# Patient Record
Sex: Female | Born: 1935 | Race: White | Hispanic: No | Smoking: Former smoker
Health system: Southern US, Community
[De-identification: ages and names within clinical notes are randomized; demographics above are authoritative.]

## PROBLEM LIST (undated history)

## (undated) DIAGNOSIS — K219 Gastro-esophageal reflux disease without esophagitis: Secondary | ICD-10-CM

## (undated) DIAGNOSIS — Z923 Personal history of irradiation: Secondary | ICD-10-CM

## (undated) DIAGNOSIS — F419 Anxiety disorder, unspecified: Secondary | ICD-10-CM

## (undated) DIAGNOSIS — Z8041 Family history of malignant neoplasm of ovary: Secondary | ICD-10-CM

## (undated) DIAGNOSIS — I071 Rheumatic tricuspid insufficiency: Secondary | ICD-10-CM

## (undated) DIAGNOSIS — Z8042 Family history of malignant neoplasm of prostate: Secondary | ICD-10-CM

## (undated) DIAGNOSIS — U071 COVID-19: Secondary | ICD-10-CM

## (undated) DIAGNOSIS — C801 Malignant (primary) neoplasm, unspecified: Secondary | ICD-10-CM

## (undated) DIAGNOSIS — IMO0002 Reserved for concepts with insufficient information to code with codable children: Secondary | ICD-10-CM

## (undated) DIAGNOSIS — R319 Hematuria, unspecified: Secondary | ICD-10-CM

## (undated) DIAGNOSIS — I10 Essential (primary) hypertension: Secondary | ICD-10-CM

## (undated) DIAGNOSIS — F32A Depression, unspecified: Secondary | ICD-10-CM

## (undated) DIAGNOSIS — F329 Major depressive disorder, single episode, unspecified: Secondary | ICD-10-CM

## (undated) DIAGNOSIS — Z803 Family history of malignant neoplasm of breast: Secondary | ICD-10-CM

## (undated) DIAGNOSIS — Z800 Family history of malignant neoplasm of digestive organs: Secondary | ICD-10-CM

## (undated) DIAGNOSIS — I2510 Atherosclerotic heart disease of native coronary artery without angina pectoris: Secondary | ICD-10-CM

## (undated) HISTORY — DX: Anxiety disorder, unspecified: F41.9

## (undated) HISTORY — DX: Rheumatic tricuspid insufficiency: I07.1

## (undated) HISTORY — DX: Malignant (primary) neoplasm, unspecified: C80.1

## (undated) HISTORY — PX: COLON SURGERY: SHX602

## (undated) HISTORY — DX: Major depressive disorder, single episode, unspecified: F32.9

## (undated) HISTORY — DX: Family history of malignant neoplasm of breast: Z80.3

## (undated) HISTORY — DX: Reserved for concepts with insufficient information to code with codable children: IMO0002

## (undated) HISTORY — DX: Gastro-esophageal reflux disease without esophagitis: K21.9

## (undated) HISTORY — DX: Depression, unspecified: F32.A

## (undated) HISTORY — DX: Hematuria, unspecified: R31.9

## (undated) HISTORY — DX: Family history of malignant neoplasm of prostate: Z80.42

## (undated) HISTORY — PX: MASTECTOMY: SHX3

## (undated) HISTORY — PX: ABDOMINAL HYSTERECTOMY: SHX81

## (undated) HISTORY — DX: Atherosclerotic heart disease of native coronary artery without angina pectoris: I25.10

## (undated) HISTORY — DX: Family history of malignant neoplasm of digestive organs: Z80.0

## (undated) HISTORY — DX: Essential (primary) hypertension: I10

## (undated) HISTORY — PX: TONSILLECTOMY: SUR1361

## (undated) HISTORY — DX: Family history of malignant neoplasm of ovary: Z80.41

---

## 1998-08-17 ENCOUNTER — Ambulatory Visit (HOSPITAL_COMMUNITY): Admission: RE | Admit: 1998-08-17 | Discharge: 1998-08-17 | Payer: Self-pay | Admitting: Gastroenterology

## 1998-08-17 ENCOUNTER — Encounter: Payer: Self-pay | Admitting: Gastroenterology

## 1998-10-05 ENCOUNTER — Inpatient Hospital Stay (HOSPITAL_COMMUNITY): Admission: EM | Admit: 1998-10-05 | Discharge: 1998-10-13 | Payer: Self-pay | Admitting: Emergency Medicine

## 1998-10-25 ENCOUNTER — Encounter: Payer: Self-pay | Admitting: Surgery

## 1998-10-25 ENCOUNTER — Inpatient Hospital Stay (HOSPITAL_COMMUNITY): Admission: EM | Admit: 1998-10-25 | Discharge: 1998-10-28 | Payer: Self-pay | Admitting: Emergency Medicine

## 1998-11-01 ENCOUNTER — Encounter: Payer: Self-pay | Admitting: Surgery

## 1998-11-01 ENCOUNTER — Observation Stay (HOSPITAL_COMMUNITY): Admission: EM | Admit: 1998-11-01 | Discharge: 1998-11-02 | Payer: Self-pay

## 1999-11-28 ENCOUNTER — Emergency Department (HOSPITAL_COMMUNITY): Admission: EM | Admit: 1999-11-28 | Discharge: 1999-11-28 | Payer: Self-pay | Admitting: Emergency Medicine

## 1999-11-28 ENCOUNTER — Encounter: Payer: Self-pay | Admitting: Emergency Medicine

## 1999-11-29 ENCOUNTER — Emergency Department (HOSPITAL_COMMUNITY): Admission: EM | Admit: 1999-11-29 | Discharge: 1999-11-29 | Payer: Self-pay | Admitting: Emergency Medicine

## 1999-11-30 ENCOUNTER — Emergency Department (HOSPITAL_COMMUNITY): Admission: EM | Admit: 1999-11-30 | Discharge: 1999-11-30 | Payer: Self-pay | Admitting: *Deleted

## 1999-11-30 ENCOUNTER — Encounter: Payer: Self-pay | Admitting: Urology

## 2000-01-08 ENCOUNTER — Emergency Department (HOSPITAL_COMMUNITY): Admission: EM | Admit: 2000-01-08 | Discharge: 2000-01-08 | Payer: Self-pay | Admitting: Emergency Medicine

## 2001-09-11 ENCOUNTER — Encounter: Payer: Self-pay | Admitting: Internal Medicine

## 2001-09-11 ENCOUNTER — Encounter: Admission: RE | Admit: 2001-09-11 | Discharge: 2001-09-11 | Payer: Self-pay | Admitting: Internal Medicine

## 2002-09-03 ENCOUNTER — Encounter: Payer: Self-pay | Admitting: *Deleted

## 2002-09-03 ENCOUNTER — Inpatient Hospital Stay (HOSPITAL_COMMUNITY): Admission: EM | Admit: 2002-09-03 | Discharge: 2002-09-06 | Payer: Self-pay

## 2002-09-06 ENCOUNTER — Encounter: Payer: Self-pay | Admitting: *Deleted

## 2002-09-12 ENCOUNTER — Encounter: Payer: Self-pay | Admitting: Gastroenterology

## 2005-01-26 ENCOUNTER — Ambulatory Visit: Payer: Self-pay | Admitting: Gastroenterology

## 2005-02-14 ENCOUNTER — Ambulatory Visit: Payer: Self-pay | Admitting: Gastroenterology

## 2005-11-10 ENCOUNTER — Encounter: Admission: RE | Admit: 2005-11-10 | Discharge: 2005-11-10 | Payer: Self-pay | Admitting: Internal Medicine

## 2006-02-20 ENCOUNTER — Encounter: Admission: RE | Admit: 2006-02-20 | Discharge: 2006-02-20 | Payer: Self-pay | Admitting: Internal Medicine

## 2006-10-09 ENCOUNTER — Ambulatory Visit: Payer: Self-pay | Admitting: Gastroenterology

## 2006-10-10 ENCOUNTER — Ambulatory Visit: Payer: Self-pay | Admitting: Gastroenterology

## 2007-01-30 ENCOUNTER — Encounter: Admission: RE | Admit: 2007-01-30 | Discharge: 2007-01-30 | Payer: Self-pay | Admitting: Internal Medicine

## 2007-04-09 ENCOUNTER — Ambulatory Visit: Payer: Self-pay | Admitting: Gastroenterology

## 2007-09-04 ENCOUNTER — Ambulatory Visit: Payer: Self-pay | Admitting: Internal Medicine

## 2007-09-11 ENCOUNTER — Ambulatory Visit: Payer: Self-pay | Admitting: Internal Medicine

## 2007-09-19 DIAGNOSIS — K219 Gastro-esophageal reflux disease without esophagitis: Secondary | ICD-10-CM

## 2007-10-15 ENCOUNTER — Telehealth (INDEPENDENT_AMBULATORY_CARE_PROVIDER_SITE_OTHER): Payer: Self-pay | Admitting: *Deleted

## 2007-10-30 ENCOUNTER — Ambulatory Visit: Payer: Self-pay | Admitting: Internal Medicine

## 2007-10-30 DIAGNOSIS — J45909 Unspecified asthma, uncomplicated: Secondary | ICD-10-CM | POA: Insufficient documentation

## 2007-10-30 DIAGNOSIS — R05 Cough: Secondary | ICD-10-CM

## 2007-11-12 ENCOUNTER — Telehealth (INDEPENDENT_AMBULATORY_CARE_PROVIDER_SITE_OTHER): Payer: Self-pay | Admitting: *Deleted

## 2007-11-19 ENCOUNTER — Inpatient Hospital Stay (HOSPITAL_COMMUNITY): Admission: EM | Admit: 2007-11-19 | Discharge: 2007-11-21 | Payer: Self-pay | Admitting: Emergency Medicine

## 2007-11-22 ENCOUNTER — Ambulatory Visit: Payer: Self-pay | Admitting: Internal Medicine

## 2008-01-22 ENCOUNTER — Telehealth: Payer: Self-pay | Admitting: Internal Medicine

## 2008-01-22 ENCOUNTER — Ambulatory Visit: Payer: Self-pay | Admitting: Internal Medicine

## 2008-03-13 DIAGNOSIS — R1319 Other dysphagia: Secondary | ICD-10-CM | POA: Insufficient documentation

## 2008-03-13 DIAGNOSIS — K649 Unspecified hemorrhoids: Secondary | ICD-10-CM | POA: Insufficient documentation

## 2008-04-23 ENCOUNTER — Encounter: Admission: RE | Admit: 2008-04-23 | Discharge: 2008-04-23 | Payer: Self-pay | Admitting: Geriatric Medicine

## 2008-05-15 ENCOUNTER — Telehealth: Payer: Self-pay | Admitting: Gastroenterology

## 2008-06-26 ENCOUNTER — Encounter: Payer: Self-pay | Admitting: Internal Medicine

## 2008-12-14 ENCOUNTER — Telehealth (INDEPENDENT_AMBULATORY_CARE_PROVIDER_SITE_OTHER): Payer: Self-pay | Admitting: *Deleted

## 2008-12-23 ENCOUNTER — Ambulatory Visit: Payer: Self-pay | Admitting: Internal Medicine

## 2008-12-23 DIAGNOSIS — I10 Essential (primary) hypertension: Secondary | ICD-10-CM | POA: Insufficient documentation

## 2009-02-03 ENCOUNTER — Ambulatory Visit: Payer: Self-pay | Admitting: Internal Medicine

## 2009-02-15 ENCOUNTER — Telehealth (INDEPENDENT_AMBULATORY_CARE_PROVIDER_SITE_OTHER): Payer: Self-pay | Admitting: *Deleted

## 2009-02-19 ENCOUNTER — Telehealth (INDEPENDENT_AMBULATORY_CARE_PROVIDER_SITE_OTHER): Payer: Self-pay | Admitting: *Deleted

## 2009-02-19 ENCOUNTER — Ambulatory Visit: Payer: Self-pay | Admitting: Pulmonary Disease

## 2009-02-19 DIAGNOSIS — J209 Acute bronchitis, unspecified: Secondary | ICD-10-CM

## 2009-03-02 ENCOUNTER — Ambulatory Visit: Payer: Self-pay | Admitting: Internal Medicine

## 2009-03-30 ENCOUNTER — Telehealth (INDEPENDENT_AMBULATORY_CARE_PROVIDER_SITE_OTHER): Payer: Self-pay | Admitting: *Deleted

## 2009-03-31 ENCOUNTER — Telehealth (INDEPENDENT_AMBULATORY_CARE_PROVIDER_SITE_OTHER): Payer: Self-pay | Admitting: *Deleted

## 2009-04-01 ENCOUNTER — Telehealth (INDEPENDENT_AMBULATORY_CARE_PROVIDER_SITE_OTHER): Payer: Self-pay | Admitting: *Deleted

## 2009-04-06 ENCOUNTER — Ambulatory Visit: Payer: Self-pay | Admitting: Internal Medicine

## 2009-04-06 DIAGNOSIS — J310 Chronic rhinitis: Secondary | ICD-10-CM | POA: Insufficient documentation

## 2009-04-07 ENCOUNTER — Ambulatory Visit: Payer: Self-pay | Admitting: Cardiology

## 2009-05-26 ENCOUNTER — Telehealth (INDEPENDENT_AMBULATORY_CARE_PROVIDER_SITE_OTHER): Payer: Self-pay | Admitting: *Deleted

## 2009-06-02 ENCOUNTER — Encounter (INDEPENDENT_AMBULATORY_CARE_PROVIDER_SITE_OTHER): Payer: Self-pay | Admitting: *Deleted

## 2009-06-14 ENCOUNTER — Encounter: Admission: RE | Admit: 2009-06-14 | Discharge: 2009-06-14 | Payer: Self-pay | Admitting: Geriatric Medicine

## 2009-07-02 ENCOUNTER — Encounter: Admission: RE | Admit: 2009-07-02 | Discharge: 2009-07-02 | Payer: Self-pay | Admitting: Geriatric Medicine

## 2009-07-23 ENCOUNTER — Observation Stay (HOSPITAL_COMMUNITY): Admission: EM | Admit: 2009-07-23 | Discharge: 2009-07-24 | Payer: Self-pay | Admitting: Emergency Medicine

## 2010-08-03 ENCOUNTER — Encounter: Admission: RE | Admit: 2010-08-03 | Discharge: 2010-08-03 | Payer: Self-pay | Admitting: Geriatric Medicine

## 2010-08-04 ENCOUNTER — Encounter: Admission: RE | Admit: 2010-08-04 | Discharge: 2010-08-04 | Payer: Self-pay | Admitting: Geriatric Medicine

## 2010-08-11 ENCOUNTER — Encounter: Admission: RE | Admit: 2010-08-11 | Discharge: 2010-08-11 | Payer: Self-pay | Admitting: Geriatric Medicine

## 2010-08-11 ENCOUNTER — Ambulatory Visit: Payer: Self-pay | Admitting: Oncology

## 2010-08-19 ENCOUNTER — Encounter: Admission: RE | Admit: 2010-08-19 | Discharge: 2010-08-19 | Payer: Self-pay | Admitting: Geriatric Medicine

## 2010-08-30 HISTORY — PX: BREAST SURGERY: SHX581

## 2010-09-13 ENCOUNTER — Ambulatory Visit (HOSPITAL_COMMUNITY): Admission: RE | Admit: 2010-09-13 | Discharge: 2010-09-14 | Payer: Self-pay | Admitting: General Surgery

## 2010-09-13 ENCOUNTER — Encounter (INDEPENDENT_AMBULATORY_CARE_PROVIDER_SITE_OTHER): Payer: Self-pay | Admitting: General Surgery

## 2010-10-03 ENCOUNTER — Ambulatory Visit: Payer: Self-pay | Admitting: Oncology

## 2010-10-05 ENCOUNTER — Ambulatory Visit
Admission: RE | Admit: 2010-10-05 | Discharge: 2010-11-29 | Payer: Self-pay | Source: Home / Self Care | Attending: Radiation Oncology | Admitting: Radiation Oncology

## 2010-11-03 ENCOUNTER — Ambulatory Visit: Payer: Self-pay | Admitting: Oncology

## 2010-11-11 ENCOUNTER — Ambulatory Visit
Admission: RE | Admit: 2010-11-11 | Discharge: 2010-11-11 | Payer: Self-pay | Source: Home / Self Care | Attending: General Surgery | Admitting: General Surgery

## 2010-11-30 ENCOUNTER — Ambulatory Visit: Payer: Medicare Other | Admitting: Radiation Oncology

## 2010-12-05 ENCOUNTER — Ambulatory Visit: Payer: Medicare Other | Attending: Radiation Oncology | Admitting: Radiation Oncology

## 2010-12-05 DIAGNOSIS — C50119 Malignant neoplasm of central portion of unspecified female breast: Secondary | ICD-10-CM | POA: Insufficient documentation

## 2010-12-05 DIAGNOSIS — A0811 Acute gastroenteropathy due to Norwalk agent: Secondary | ICD-10-CM | POA: Insufficient documentation

## 2010-12-05 DIAGNOSIS — R071 Chest pain on breathing: Secondary | ICD-10-CM | POA: Insufficient documentation

## 2010-12-05 DIAGNOSIS — Z510 Encounter for antineoplastic radiation therapy: Secondary | ICD-10-CM | POA: Insufficient documentation

## 2010-12-05 DIAGNOSIS — L539 Erythematous condition, unspecified: Secondary | ICD-10-CM | POA: Insufficient documentation

## 2010-12-05 DIAGNOSIS — IMO0002 Reserved for concepts with insufficient information to code with codable children: Secondary | ICD-10-CM | POA: Insufficient documentation

## 2010-12-05 DIAGNOSIS — Y836 Removal of other organ (partial) (total) as the cause of abnormal reaction of the patient, or of later complication, without mention of misadventure at the time of the procedure: Secondary | ICD-10-CM | POA: Insufficient documentation

## 2010-12-05 DIAGNOSIS — H0289 Other specified disorders of eyelid: Secondary | ICD-10-CM | POA: Insufficient documentation

## 2011-01-10 LAB — COMPREHENSIVE METABOLIC PANEL
ALT: 31 U/L (ref 0–35)
Albumin: 4 g/dL (ref 3.5–5.2)
Alkaline Phosphatase: 105 U/L (ref 39–117)
Calcium: 9.5 mg/dL (ref 8.4–10.5)
Chloride: 106 mEq/L (ref 96–112)
GFR calc non Af Amer: 53 mL/min — ABNORMAL LOW (ref >60)
Potassium: 3.9 mEq/L (ref 3.5–5.1)
Sodium: 140 mEq/L (ref 135–145)
Total Protein: 6.5 g/dL (ref 6.0–8.3)

## 2011-01-10 LAB — DIFFERENTIAL
Basophils Relative: 2 % — ABNORMAL HIGH (ref 0–1)
Eosinophils Absolute: 0.5 10*3/uL (ref 0.0–0.7)
Lymphs Abs: 1.5 10*3/uL (ref 0.7–4.0)
Monocytes Absolute: 0.5 10*3/uL (ref 0.1–1.0)
Monocytes Relative: 10 % (ref 3–12)
Neutro Abs: 2.9 10*3/uL (ref 1.7–7.7)
Neutrophils Relative %: 52 % (ref 43–77)

## 2011-01-10 LAB — CBC
MCHC: 33.6 g/dL (ref 30.0–36.0)
Platelets: 149 10*3/uL — ABNORMAL LOW (ref 150–400)
RDW: 13.1 % (ref 11.5–15.5)
WBC: 5.5 10*3/uL (ref 4.0–10.5)

## 2011-01-10 LAB — SURGICAL PCR SCREEN: MRSA, PCR: NEGATIVE

## 2011-01-15 ENCOUNTER — Emergency Department (HOSPITAL_COMMUNITY)
Admission: EM | Admit: 2011-01-15 | Discharge: 2011-01-15 | Disposition: A | Discharge status: Home / Self Care | Payer: Medicare Other | Attending: Emergency Medicine | Admitting: Emergency Medicine

## 2011-01-15 DIAGNOSIS — Z853 Personal history of malignant neoplasm of breast: Secondary | ICD-10-CM | POA: Insufficient documentation

## 2011-01-15 DIAGNOSIS — E869 Volume depletion, unspecified: Secondary | ICD-10-CM | POA: Insufficient documentation

## 2011-01-15 DIAGNOSIS — R000 Tachycardia, unspecified: Secondary | ICD-10-CM | POA: Insufficient documentation

## 2011-01-15 DIAGNOSIS — I10 Essential (primary) hypertension: Secondary | ICD-10-CM | POA: Insufficient documentation

## 2011-01-15 DIAGNOSIS — R112 Nausea with vomiting, unspecified: Secondary | ICD-10-CM | POA: Insufficient documentation

## 2011-01-15 DIAGNOSIS — R197 Diarrhea, unspecified: Secondary | ICD-10-CM | POA: Insufficient documentation

## 2011-01-15 LAB — POCT I-STAT, CHEM 8
BUN: 29 mg/dL — ABNORMAL HIGH (ref 6–23)
Chloride: 102 mEq/L (ref 96–112)
Creatinine, Ser: 1.1 mg/dL (ref 0.4–1.2)
Sodium: 139 mEq/L (ref 135–145)
TCO2: 29 mmol/L (ref 0–100)

## 2011-01-23 ENCOUNTER — Encounter (HOSPITAL_BASED_OUTPATIENT_CLINIC_OR_DEPARTMENT_OTHER): Payer: Medicare Other | Admitting: Oncology

## 2011-01-23 ENCOUNTER — Other Ambulatory Visit: Payer: Self-pay | Admitting: Oncology

## 2011-01-23 DIAGNOSIS — C50119 Malignant neoplasm of central portion of unspecified female breast: Secondary | ICD-10-CM

## 2011-01-23 LAB — CBC WITH DIFFERENTIAL/PLATELET
Basophils Absolute: 0 10*3/uL (ref 0.0–0.1)
Eosinophils Absolute: 0.4 10*3/uL (ref 0.0–0.5)
HCT: 41.2 % (ref 34.8–46.6)
LYMPH%: 14.1 % (ref 14.0–49.7)
MCV: 93.4 fL (ref 79.5–101.0)
MONO#: 0.3 10*3/uL (ref 0.1–0.9)
MONO%: 7.1 % (ref 0.0–14.0)
NEUT#: 2.9 10*3/uL (ref 1.5–6.5)
NEUT%: 68.3 % (ref 38.4–76.8)
Platelets: 185 10*3/uL (ref 145–400)
RBC: 4.4 10*6/uL (ref 3.70–5.45)
WBC: 4.2 10*3/uL (ref 3.9–10.3)

## 2011-01-24 LAB — COMPREHENSIVE METABOLIC PANEL
Alkaline Phosphatase: 82 U/L (ref 39–117)
BUN: 14 mg/dL (ref 6–23)
CO2: 26 mEq/L (ref 19–32)
Creatinine, Ser: 0.98 mg/dL (ref 0.40–1.20)
Glucose, Bld: 91 mg/dL (ref 70–99)
Total Bilirubin: 0.6 mg/dL (ref 0.3–1.2)
Total Protein: 6.5 g/dL (ref 6.0–8.3)

## 2011-01-24 LAB — VITAMIN D 25 HYDROXY (VIT D DEFICIENCY, FRACTURES): Vit D, 25-Hydroxy: 39 ng/mL (ref 30–89)

## 2011-01-31 ENCOUNTER — Encounter (HOSPITAL_BASED_OUTPATIENT_CLINIC_OR_DEPARTMENT_OTHER): Payer: Medicare Other | Admitting: Oncology

## 2011-01-31 DIAGNOSIS — C50119 Malignant neoplasm of central portion of unspecified female breast: Secondary | ICD-10-CM

## 2011-01-31 DIAGNOSIS — M810 Age-related osteoporosis without current pathological fracture: Secondary | ICD-10-CM

## 2011-02-03 LAB — POCT CARDIAC MARKERS
CKMB, poc: <1 ng/mL — ABNORMAL LOW (ref 1.0–8.0)
Myoglobin, poc: 70.8 ng/mL (ref 12–200)
Myoglobin, poc: 72 ng/mL (ref 12–200)
Troponin i, poc: <0.05 ng/mL (ref 0.00–0.09)

## 2011-02-03 LAB — COMPREHENSIVE METABOLIC PANEL
ALT: 27 U/L (ref 0–35)
AST: 31 U/L (ref 0–37)
Alkaline Phosphatase: 79 U/L (ref 39–117)
CO2: 28 mEq/L (ref 19–32)
Calcium: 9.5 mg/dL (ref 8.4–10.5)
Chloride: 107 mEq/L (ref 96–112)
GFR calc Af Amer: >60 mL/min (ref >60)
GFR calc non Af Amer: 55 mL/min — ABNORMAL LOW (ref >60)
Glucose, Bld: 104 mg/dL — ABNORMAL HIGH (ref 70–99)
Potassium: 3.6 mEq/L (ref 3.5–5.1)
Sodium: 141 mEq/L (ref 135–145)
Total Bilirubin: 0.7 mg/dL (ref 0.3–1.2)

## 2011-02-03 LAB — CBC
HCT: 36.7 % (ref 36.0–46.0)
HCT: 42.6 % (ref 36.0–46.0)
Hemoglobin: 12.6 g/dL (ref 12.0–15.0)
Hemoglobin: 14.6 g/dL (ref 12.0–15.0)
MCV: 95.8 fL (ref 78.0–100.0)
MCV: 95.8 fL (ref 78.0–100.0)
Platelets: 159 10*3/uL (ref 150–400)
RBC: 4.45 MIL/uL (ref 3.87–5.11)
WBC: 5.1 10*3/uL (ref 4.0–10.5)
WBC: 5.8 10*3/uL (ref 4.0–10.5)

## 2011-02-03 LAB — CARDIAC PANEL(CRET KIN+CKTOT+MB+TROPI)
CK, MB: 0.7 ng/mL (ref 0.3–4.0)
Total CK: 39 U/L (ref 7–177)
Total CK: 44 U/L (ref 7–177)
Troponin I: 0.01 ng/mL (ref 0.00–0.06)

## 2011-02-03 LAB — HEMOGLOBIN A1C: Hgb A1c MFr Bld: 5.2 % (ref 4.6–6.1)

## 2011-02-03 LAB — POCT I-STAT, CHEM 8
BUN: 24 mg/dL — ABNORMAL HIGH (ref 6–23)
Chloride: 105 mEq/L (ref 96–112)
Creatinine, Ser: 1.2 mg/dL (ref 0.4–1.2)
Sodium: 138 mEq/L (ref 135–145)
TCO2: 24 mmol/L (ref 0–100)

## 2011-02-03 LAB — DIFFERENTIAL
Basophils Absolute: 0 10*3/uL (ref 0.0–0.1)
Eosinophils Relative: 5 % (ref 0–5)
Lymphocytes Relative: 33 % (ref 12–46)
Lymphs Abs: 1.9 10*3/uL (ref 0.7–4.0)
Neutro Abs: 3 10*3/uL (ref 1.7–7.7)

## 2011-02-03 LAB — BASIC METABOLIC PANEL
Chloride: 105 mEq/L (ref 96–112)
GFR calc non Af Amer: 45 mL/min — ABNORMAL LOW (ref >60)
Glucose, Bld: 109 mg/dL — ABNORMAL HIGH (ref 70–99)
Potassium: 3.6 mEq/L (ref 3.5–5.1)
Sodium: 141 mEq/L (ref 135–145)

## 2011-02-03 LAB — PROTIME-INR: Prothrombin Time: 13.2 seconds (ref 11.6–15.2)

## 2011-02-03 LAB — TSH: TSH: 0.978 u[IU]/mL (ref 0.350–4.500)

## 2011-02-03 LAB — TROPONIN I: Troponin I: 0.01 ng/mL (ref 0.00–0.06)

## 2011-02-20 ENCOUNTER — Other Ambulatory Visit: Payer: Self-pay | Admitting: Dermatology

## 2011-02-22 ENCOUNTER — Ambulatory Visit: Admission: RE | Admit: 2011-02-22 | Payer: Medicare Other | Source: Ambulatory Visit | Admitting: Radiation Oncology

## 2011-03-14 NOTE — Discharge Summary (Signed)
Gloria [name], Gloria [name]                 ACCOUNT NO.:  [id number]   MEDICAL RECORD NO.:  [id number]          PATIENT TYPE:  INP   LOCATION:  2029                         FACILITY:  [facility]   PHYSICIAN:  Ramiro Harvest, MD    DATE OF BIRTH:  [birth year]   DATE OF ADMISSION:  11/19/2007  DATE OF DISCHARGE:  11/21/2007                               DISCHARGE SUMMARY   PRIMARY CARE PHYSICIAN:  Hal T. Stoneking, M.D.   PULMONOLOGIST:  Charlaine Dalton. Sherene Sires, M.D., [facility], of [facility].   DISCHARGE DIAGNOSES:  1. Atypical chest pain, likely musculoskeletal in nature.  2. Hypokalemia.  3. Hypertension.  4. History of asthma.  5. Gastroesophageal reflux disease.  6. Chronic sinusitis.  7. Question of mitral valve prolapse.  8. History of diverticulitis.   DISCHARGE MEDICATIONS:  1. Tylenol Extra Strength 500 mg p.o. t.i.d. x1 week.  2. Doxepin 20 mg p.o. q.h.s.  3. HCTZ 25 mg p.o. daily.  4. Xanax 0.25 mg p.o. t.i.d. p.r.n.  5. Aspirin 81 mg p.o. daily.  6. Prilosec 20 mg p.o. daily.  7. Pulmicort b.i.d.  8. Brovana nebulizers b.i.d. as previously taken.   DISPOSITION/FOLLOWUP:  The patient will be discharged home.  The patient  is to keep her scheduled followup with Dr. Sherene Sires on November 22, 2007.  The patient is to schedule a followup appointment with primary care  physician in 1 week, and followup basic metabolic profile needs to be  checked to follow up on the patient's electrolytes, mainly her  potassium, and also to reassess the patient's musculoskeletal chest pain  and decide whether the patient needs to continue on the scheduled doses  of Tylenol.   PROCEDURES:  1. Chest x-ray was obtained on November 19, 2007, which showed a mild      left basilar atelectasis.  2. Plain films of the left shoulder were done on November 19, 2007,      which showed degenerative changes of the left [facility] joint.   CONSULTATIONS:  None.   BRIEF ADMISSION HISTORY AND PHYSICAL:  Gloria [name] is a  77 year old  white female, past medical history of GERD, chronic sinusitis, asthma,  history of questionable mitral valve prolapse and also elevated LFTs who  had presented with complaints of chest pain.  It was also noted that the  patient did have a cardiac catheterization done in 2003 per Dr. Riley Kill  with no coronary artery disease recorded.  The patient had stated that  her chest pain had started a little over an hour prior to arrival in the  ED in the early morning of admission.  Initially the patient was having  pain in the shoulder blade area of her back, which then moved to the  left precordial area.  At its worst, she stated that it was an 8 out of  10 in intensity with associated shortness of breath and nausea but no  diaphoresis or vomiting.  The patient had reported that she was awakened  again by the pain, which was nonexertional.  She also stated that her  left chest wall area felt  sore to touch and whenever she raised  or  moved her left arm it seemed to precipitate some numbness down the left  arm.  The patient denied any cough, fevers, dysuria, melena, diarrhea or  hematochezia.  The patient was seen in the ED and had point-of-care  markers which were negative.  A D-dimer was within normal limits at  0.22.  EKG showed normal sinus rhythm with no acute ischemic changes and  chest x-ray with no acute infiltrates.  The patient was admitted for  further evaluation and management.   PHYSICAL EXAMINATION:  VITAL SIGNS:  Temperature 97.4, blood pressure  150/93, pulse 70, respiratory rate 17, O2 sat 97% on room air.  GENERAL:  The patient is an elderly white female in no respiratory  distress.  HEENT:  Normocephalic, atraumatic.  Pupils are equal, round and reactive  to light.  Extraocular movements are intact.  Sclerae are  anicteric.  Moist mucous membranes.  No exudates.  NECK:  Supple.  No lymphadenopathy.  No thyromegaly.  No JVD.  LUNGS:  Diminished air movement  bilaterally.  No crackles.  No wheezes.  CHEST:  Left-sided chest wall tenderness.  CARDIOVASCULAR:  Regular rate and rhythm.  Normal S1 and S2.  No S3  appreciated.  ABDOMEN:  Soft.  Positive bowel sounds.  Nontender, nondistended.  No  palpable masses.  EXTREMITIES:  No clubbing, cyanosis or edema.   ADMISSION LABORATORIES:  White count 5, hemoglobin 13.7, hematocrit  39.3, platelet count 171,000.  Sodium 140, potassium 3.6, chloride 106,  BUN 19, glucose 99, pH of 7.43, pCO2 of 43, creatinine 1.1.  Point-of-  care cardiac markers were negative.   HOSPITAL COURSE:  1. Atypical chest pain.  Serial cardiac enzymes were obtained, which      came out negative.  Chest x-ray was also obtained, which was      negative.  The patient was placed on aspirin as well as as-needed      nitroglycerin.  A left shoulder x-ray was done with results as      stated above.  The patient was placed on a proton pump inhibitor.      A D-dimer was also negative.  Due to allergy to NSAIDs, which as      per patient had led to bronchospastic asthma exacerbation, it was      decided to place the patient on scheduled Tylenol.  The patient was      placed on scheduled Tylenol with improvement in symptoms.  The      patient will be discharged home in stable and improved condition      with followup with PCP.  The patient will be discharged with      scheduled Tylenol Extra Strength 500 mg 3 times daily for 1 week      until reassessed by PCP in a week post discharge.  2. Hypertension, stable.  The patient was maintained on outpatient      medication of hydrochlorothiazide 25 mg daily.  3. History of asthma/chronic obstructive pulmonary disease,  stable.      The patient was just maintained on Xopenex and Atrovent while in-      house and also on Pulmicort.  The patient was stable, had no      wheezes, and the patient will be discharged home on her home dose      of Pulmicort and Brovana to follow up with the  pulmonologist, Dr.      Sherene Sires, on  November 22, 2007.  4. Gastroesophageal reflux disease, stable.  The patient was      maintained on a PPI throughout the hospitalization.  5. History of elevated liver function tests.  The patient had LFTs      which were rechecked during the hospitalization, and these came      back normal.   The rest of the patient's chronic medical issues were stable throughout  the hospitalization.  On the day of discharge, the patient was in stable  and improved condition.  Vital signs:  Temperature 96.4, pulse 69,  respirations 20, blood pressure 136/87, satting 94% on room air.   DISCHARGE LABORATORIES:  Sodium 140, potassium 4.1, chloride 108, bicarb  26, glucose 97, BUN 21, creatinine 1.10 and a calcium of 8.9 and  magnesium level of 2.2.   It has been a pleasure taking care of Ms. Adah Salvage.      Ramiro Harvest, MD  Electronically Signed     DT/MEDQ  D:  11/21/2007  T:  11/21/2007  Job:  [id number]   cc:   Hal T. Stoneking, M.D.  Charlaine Dalton. Sherene Sires, MD, FCCP

## 2011-03-14 NOTE — Assessment & Plan Note (Signed)
[name]                             PULMONARY OFFICE NOTE   Gloria [name], Gloria [name]                        MRN:          [id number]  DATE:09/11/2007                            DOB:          [birth year]    HISTORY OF PRESENT ILLNESS:  This is a 77 year old white female patient  of Dr. Thurston Hole who was recently seen for refractory cough and asthmatic  bronchitis.  The patient at last visit was given Brovana and budesonide  twice daily.  Add in Mucinex DM twice daily for cough control.  The  patient returns today reporting that she is substantially improved,  feels much better with totally resolved cough.  She does have some  occasional congestion, but this is also substantially improved.  She  occasionally complains of some postnasal drainage symptoms.  Patient  denies any chest pain, orthopnea, PND, leg swelling, or purulent sputum.   PAST MEDICAL HISTORY:  Reviewed.   CURRENT MEDICATIONS:  Reviewed.   PHYSICAL EXAMINATION:  Patient is a pleasant female in no acute  distress.  She is afebrile with stable vital signs.  O2 saturation is 97% on room  air.  HEENT:  Nasal mucosa with some mild erythema.  Nontender sinuses.  Posterior pharynx is clear.  NECK:  Supple without cervical adenopathy.  No JVD.  LUNGS:  The lung sounds are diminished in the bases, otherwise clear.  ABDOMEN:  Soft, nontender.  No palpable hepatosplenomegaly.  EXTREMITIES:  Warm without any edema.   IMPRESSION/PLAN:  1. Cyclical cough in an asthmatic with resolved asthmatic bronchitic      flare.  Patient is much improved with cough suppression regimen and      Brovana budesonide.  We will continue on her present regimen, add      in  Mucinex DM as needed for cough and congestion.  Will add in      Zyrtec 10 mg at bedtime to help with any postnasal drip symptoms      that could be irritating the airways.  Patient will return back      with Dr. Sherene Sires as scheduled in two weeks or  sooner as needed.  2. Complex medication regimen:  Patient's medications are reviewed in      detail.  Patient      education was provided via the computerized medication calendar,      which was completed for this patient.      Rubye Oaks, NP  Electronically Signed      Charlaine Dalton. Sherene Sires, MD, [facility]  Electronically Signed   TP/MedQ  DD: 09/12/2007  DT: 09/13/2007  Job #: [id number]

## 2011-03-14 NOTE — H&P (Signed)
Gloria [name], Gloria [name]                 ACCOUNT NO.:  [id number]   MEDICAL RECORD NO.:  [id number]          PATIENT TYPE:  INP   LOCATION:  2029                         FACILITY:  [facility]   PHYSICIAN:  Kela Millin, M.D.DATE OF BIRTH:  Mar 25, 1928   DATE OF ADMISSION:  11/19/2007  DATE OF DISCHARGE:                              HISTORY & PHYSICAL   PRIMARY CARE PHYSICIAN:  Dr. Merlene Laughter.   CHIEF COMPLAINT:  Chest pain.   HISTORY OF PRESENT ILLNESS:  The patient is a 77 year old white female  with past medical history significant for GERD, chronic sinusitis,  asthma, history of mitral valve prolapse and elevated LFTs, who presents  with the above complaints.  It is also noted that the patient had a  cardiac catheterization in 2003 by Dr. Riley Kill with no coronary artery  disease recorded.  The patient states that the pain started a little  over an hour prior to her arrival in the ER early on the morning of  admission.  Initially, she was having pain in the shoulder blade area of  her back and then it moved to her left precordial area.  At its worse,  she states it was an 8/10 in intensity, associated with shortness of  breath and nausea, but no diaphoresis or vomiting.  She reports that she  was awakened by the pain -- nonexertional.  She also states that her  left chest wall area feels sore to touch and that whenever she raises or  moved her left arm, it seems to precipitate some numbness down her left  arm.  She denies cough, fevers, dysuria, melena, diarrhea, hematochezia.   She was seen in the emergency room and had point-of-care markers which  were negative, a D-dimer was within normal limits at 0.22, EKG shows  normal sinus rhythm with no acute ischemic changes and her chest x-ray  was no acute infiltrates.  She is admitted for further evaluation and  management.   PAST MEDICAL HISTORY:  1. As above.  2. History of elevated LFTs.  3. History of diverticulitis.   MEDICATIONS:  1. Doxepin 20 mg p.o. nightly.  2. Hydrochlorothiazide 25 mg p.o. daily.  3. Xanax 25 mg t.i.d.  4. Aspirin 81 mg daily.  5. Prilosec daily.  6. Pulmicort b.i.d.  7. Brovana nebulizer.   ALLERGIES:  DEMEROL, PENICILLIN, PHENERGAN, KEFLEX AND CIPRO.   SOCIAL HISTORY:  She quit tobacco 13 years ago, occasional alcohol.   FAMILY HISTORY:  Her brother is deceased at age 75, had an MI.   REVIEW OF SYSTEMS:  As per HPI, other review of systems negative.   PHYSICAL EXAMINATION:  GENERAL:  The patient is an elderly white female,  in no respiratory distress.  VITAL SIGNS:  Temperature is 97.4 with a blood pressure of 150/93, pulse  of 70, respiratory rate of 17 and O2 saturation of 97%.  HEENT:  PERRL.  EOMI.  Sclerae anicteric.  Moist mucous membranes and no  exudates.  NECK:  Supple, no adenopathy, no thyromegaly and no JVD.  LUNGS:  Diminished air movement bilaterally, no crackles  and no wheezes.  CHEST:  She has left-sided chest wall tenderness.  CARDIOVASCULAR:  Regular rate and rhythm, normal S1-S2, no S3  appreciated.  ABDOMEN:  Soft, bowel sounds present, nontender, non-distended.  No  masses palpable.  EXTREMITIES:  No cyanosis and no edema.   LABORATORY DATA:  As per HPI, also white cell count 5 with a hemoglobin  of 13.7, hematocrit of 39.3, platelet count of 171,000.  Sodium is 140  with a potassium of 3.6, chloride 106, BUN is 19, glucose 99.  The pH is  7.43, pCO2 of 43.  Creatinine is 1.1.  Point-of-care markers negative  x2.   ASSESSMENT AND PLAN:  1. Chest pain -- we will obtain serial cardiac enzymes, place on      aspirin and as-needed nitroglycerin.  As noted above, she had a      cardiac catheterization in 2003 per Dr. Riley Kill.  Follow and      consider cardiology consultation pending cardiac enzymes for      further evaluation as appropriate.  Her pain is atypical with chest      wall soreness, although she says that is not exactly the same as       the pain she has.  We will obtain a left shoulder x-ray to      evaluate.  We will also continue proton pump inhibitor, follow and      consider anti-inflammatories as appropriate.  A D-dimer is      negative, as above.  2. Hypertension -- continue outpatient medications.  3. History of asthma/chronic obstructive pulmonary disease -- stable.      She has multiple complaints about the Brovana she has been on at      home at this time.  We will try on Xopenex/Atrovent while in the      hospital.  We will continue Pulmicort.  The patient is followed by      Dr. Sherene Sires.  4. Gastroesophageal reflux disease -- continue proton pump inhibitor.  5. History of elevated liver function tests -- recheck and follow.      Kela Millin, M.D.  Electronically Signed     ACV/MEDQ  D:  11/20/2007  T:  11/20/2007  Job:  [id number]   cc:   Hal T. Stoneking, M.D.

## 2011-03-14 NOTE — Discharge Summary (Signed)
NAMETEMPESTT, SILBA                 ACCOUNT NO.:  [id number]   MEDICAL RECORD NO.:  [id number]          PATIENT TYPE:  INP   LOCATION:  2029                         FACILITY:  [facility]   PHYSICIAN:  Ramiro Harvest, MD    DATE OF BIRTH:  12/19/1927   DATE OF ADMISSION:  11/19/2007  DATE OF DISCHARGE:  11/21/2007                               DISCHARGE SUMMARY   ADDENDUM   DISCHARGE HOME MEDICATIONS:  Zyrtec 10 mg p.o. q. daily.   It has been a pleasure taking care of Ms. Gloria [name].      Ramiro Harvest, MD  Electronically Signed     DT/MEDQ  D:  11/21/2007  T:  11/21/2007  Job:  [id number]   cc:   Hal T. Stoneking, M.D.  Charlaine Dalton. Sherene Sires, MD, FCCP

## 2011-03-14 NOTE — Assessment & Plan Note (Signed)
[facility]                             PULMONARY OFFICE NOTE   Gloria [name], Gloria [name]                        MRN:          [id number]  DATE:09/04/2007                            DOB:          06-28-1928    HISTORY:  A 77 year old white female whom I actually had the pleasure of  seeing in 1995, referred by Dr. Dorothe Pea, for chronic cough and dyspnea.  I thought her problem then was chronic rhinitis/sinusitis and asthmatic  bronchitis that was exacerbated by smoking and I asked her to return if  not 100% improved after she stopped smoking.  She reported today that  her version of that exam was that you told me not to come back if I  didn't quit smoking (I have never in my life told a patient I would  refuse to see them if they kept smoking, so I think my version, which  was documented, is probably the more accurate version of that  interview.)  However, she comes in today despondent over multiple  different respiratory problems over the last year which she says,  actually cleared up completely after she stopped smoking in 1995, and  left her with excellent activity tolerance until about a year ago.  At  that point she started noticing sinus pain over the right greater than  left face associated with some morning cough and congestion with  discolored sputum which is much worse over the last several months and  seems somewhat better after using Xopenex, after using Z-Pak (which  turned the mucus back to a clear color), and after using Xopenex and  Mucinex which seemed to help her clear up her mucus.  Today she says she is doing a little bit better but aggravated that  she still has sinus pain and also that she just can't seem to get  back like she was before (meaning before her sinus pain started in  January 2008).  When I asked her about ENT evaluation for her sinus  pain,  she reports that I have had numerous scans that are all normal  and I am not going  back to a sinus doctor.  Presently she denies any pleuritic pain, purulent nasal secretions.  She  does report the worst pain in the morning underneath her right eye  versus her left that gets better as the day goes on.  She denies any  fevers, chills, sweats, orthopnea, PND, or leg swelling.   PAST MEDICAL HISTORY:  Significant for:  1. Hypertension.  2. Previous sinus surgery.  3. Colon surgery.  4. Hysterectomy.   ALLERGIES:  None known.   MEDICATIONS:  Taken from a back of a bag,  and I am not sure I have  them listed correctly.  They do include doxepin, hydrochlorothiazide,  Nexium but do not include any of her inhalers.  Her medications  include a tapering course of prednisone for which she is now on 20 mg,  one half b.i.d. and plans to taper off over the next week.   SOCIAL HISTORY:  She quit smoking in 1995  as noted above.   FAMILY HISTORY:  Positive for allergies and asthma.  Negative for  atypia.   REVIEW OF SYSTEMS:  Taken in detail on the worksheet, negative except as  outlined above.   PHYSICAL EXAMINATION:  GENERAL:  This is an ambulatory white female who  appears approximately stated age, at times appears angry, at times  appears to have a hopeless/helpless affect and attitude, and typically  does not answer questions in a straightforward fashion.  VITAL SIGNS:  She is afebrile with normal vital signs.  She is up to 141  pounds from a baseline of 128 when she quit smoking.  HEENT:  Remarkable for mild nonspecific turbinate edema.  Oropharynx is  clear.  I could not appreciate any significant tenderness over the  maxillary sinus.  Ear canals were also clear bilaterally.  NECK:  Supple without cervical adenopathy or tenderness.  Trachea is  midline.  No thyromegaly.  LUNGS:  Fields reveal junky inspiratory and expiratory rhonchi  bilaterally associated with coughing paroxysms on SVC maneuver.  HEART:  There is a regular rhythm without murmur, gallop, or rub.   ABDOMEN:  Soft, benign.  EXTREMITIES:  Warm without calf tenderness, cyanosis, clubbing, edema.   Chest x-ray, August 26, 2007, is normal.   IMPRESSION:  1. Refractory cough consistent with asthmatic bronchitis that is some      better after using Xopenex, Mucinex, and Z-Pak.  I note that she      is also on prednisone at this point and suspect that she will flare      again if not placed on a maintenance regimen and therefore I      recommended a trial of Brovana 15 mcg per vial, budesonide 0.5 mg      per vial perfectly regularly used twice a day.  If sputum turns      purulent again and it has responded to Zithromax (which is about      the only antibiotic she can take), I think it is fine to give her      another course of Zithromax or Biaxin although we do stand the risk      of resistant organisms.  If coughing or having just thick mucus, I      would use Mucinex DM two twice a day.  What needs to be done next      if we are going to help this patient in the respiratory clinic is a      better appreciation of maintenance versus as needed versus short      term therapy than can be reflected on the back of a bag, which is      how she presented her medicine today.  This was both ambiguous and      incomplete in terms of how she is taking her medicines.  I have      offered her the services of our nurse practitioner for this if she      will simply bring her medicines organized into 3 categories,      maintenance, as needed, and short course, so that we can adjust her      medicines to her benefit.  2. This of course does not shed any light on her chronic facial pain      which may or may not be sinus related.  The fact that it is worse      in the morning does suggest a sinus mechanism but the fact  that she      says she has had multiple normal sinus CT scans makes this less      likely.  Because I always felt she had a combination of rhinitis      and asthmatic bronchitis,  Singulair would be a good option for her      on a trial basis if never used before.  3. Intolerant to multiple drugs with an affect and attitude that seems      to me both anxious and depressed.  This will make it more important      than ever that we work together to minimize the number of medicines      she is required to take long term but raises the issue of whether      or not she should be on higher doses of antidepressants or      anxiolytics.  I will defer, of course, this issue to Dr.      Laverle Hobby capable hands and focus on her respiratory      complaints when she returns, if she will return for full medication      reconciliation purposes as outlined above.     Charlaine Dalton. Sherene Sires, MD, [facility]  Electronically Signed    MBW/MedQ  DD: 09/04/2007  DT: 09/04/2007  Job #: [id number]   cc:   Hal T. Stoneking, M.D.

## 2011-03-14 NOTE — Assessment & Plan Note (Signed)
[facility]                         GASTROENTEROLOGY OFFICE NOTE   ALYDIA, GOSSER                        MRN:          [id number]  DATE:04/09/2007                            DOB:          [birth year]    [place] ran out of her Nexium, has had recurrent reflux symptoms.  When she  has reflux symptoms, she has subxiphoid/left upper quadrant discomfort.  She otherwise denies any GI complaints.  She is due for a follow-up  colonoscopy in November because of a family history of colon cancer in  her mother, who died at age 44.  Her last colonoscopy will be five years  ago.   PHYSICAL EXAMINATION:  VITAL SIGNS:  Weight is 142 pounds.  Blood  pressure 122/70, pulse 80 and regular.  ABDOMEN:  Unremarkable without organomegaly, masses, or significant  tenderness.  Bowel sounds were normal.   ASSESSMENT:  1. Recurrent acid reflux disease, doing well on maintenance proton      pump inhibitor therapy.  2. Status post partial colectomy for recurrent diverticulitis.  3. Family history of colon cancer.   RECOMMENDATIONS:  1. Renew Nexium, to be used 30 minutes before breakfast each day and      twice daily if needed.  2. Outpatient colonoscopy this coming fall.  3. Other medications per her primary care physicians.     Vania Rea. Jarold Motto, MD, [facility], FAGA  Electronically Signed    DRP/MedQ  DD: 04/09/2007  DT: 04/10/2007  Job #: [id number]

## 2011-03-17 NOTE — H&P (Signed)
NAME:  Gloria [name], Gloria [name]                             ACCOUNT NO.:  [id number]   MEDICAL RECORD NO.:  [id number]                    PATIENT TYPE:   LOCATION:                                       FACILITY:   PHYSICIAN:  Cecil Cranker, M.D. [facility]         DATE OF BIRTH:  12/06/1927   DATE OF ADMISSION:  09/03/2002  DATE OF DISCHARGE:                                HISTORY & PHYSICAL   HISTORY OF PRESENT ILLNESS:  The patient is a very pleasant 77 year old  white female with recurrent prolonged chest pain described as substernal  squeezing, heavy, and awakening her from sleep the last two mornings at 4:00  a.m. and lasting for several hours. It has been associated with some  shortness of breath and diaphoresis. The patient initially noted symptoms  while at [facility] one week ago. This has not been associated with  exertion. The patient has a history of esophageal disease but these symptoms  are different. The patient states that her symptoms today started at 4:00  a.m. and persisted until about 2:30 p.m. and has been very minor since that  time. She has had no dizziness or palpitations.   PAST MEDICAL HISTORY:  Include mitral valve prolapse, diverticulosis,  gastroesophageal reflux disease, chronic sinus problems.   PAST SURGICAL HISTORY:  Includes partial colectomy, tonsillectomy, total  abdominal hysterectomy.   SOCIAL HISTORY:  She is retired.   ALLERGIES:  CIPRO, PENICILLIN, ASPIRIN. No seafood allergies.   MEDICATIONS:  1. Doxepin 10 mg at bedtime.  2. Xanax 0.25 mg at bedtime.  3. Allegra as needed.  4. Nexium daily.   RISK FACTORS:  No prior history of coronary artery disease. No history of  hypertension or diabetes mellitus. She smoked until ten years ago. She does  not know about lipids. She has a very strong family history of coronary  artery disease with father having angina in his 14's and brother having  coronary artery disease at 39 years and a sister at 25  years having had  three stents. A brother died at age 61 years.   REVIEW OF SYSTEMS:  HEENT unremarkable. Cardiopulmonary unremarkable except  as above. GU is negative. Neuropsychiatric history is negative.  Musculoskeletal history reveals some discomfort down the left arm but it has  not been associated with chest discomfort. GI history reveals left inguinal  hernia no acute distress she has had esophageal stricture for which she has  been seen by Dr. Jarold Motto.   PHYSICAL EXAMINATION:  VITAL SIGNS: Blood pressure 151/90, pulse 79. Normal  sinus rhythm. Respirations normal. Temperature 97.1.  GENERAL: Normal. No acute distress.  HEENT: Unremarkable.  NECK: No jugular venous distention. No lymphadenopathy. Pulses palpable  without bruits.  LUNGS: Clear.  HEART: No murmur, rub, or gallop. Do not hear a click.  ABDOMEN: Liver, spleen, and kidney not palpable.  NEURO: Unremarkable.   DIAGNOSTIC STUDIES:  Chest x-ray  reveals atelectasis in the left base. No  active disease. EKG normal.   LABORATORY DATA:  Renal profile normal. BUN 12, creatinine 1.0, PT normal,  total protein normal. Cardiac enzymes pending.   IMPRESSION:  1. Recurrent prolonged chest pain with strong family history of coronary     artery disease. Symptoms are somewhat atypical.  2. Gastroesophageal reflux disease.  3. History of mitral valve prolapse.  4. Status post hysterectomy.  5. Status post partial colectomy.  6. Status post tonsillectomy.  7. Esophageal stricture.  8. Left inguinal hernia.   PLAN:  Because of the recurrent chest pain, family history, as well as  cigarettes, I have suggested diagnostic coronary angiography. The patient  and sister understand the risks and agree with this approach. Initially will  give her Heparin, IV Nitroglycerin, as well as beta blockers. Check enzymes  and follow-up EKG's.                                               Cecil Cranker, M.D. [facility]    EJL/MEDQ  D:   09/03/2002  T:  09/03/2002  Job:  [id number]   cc:   Ike Bene, M.D.  [address]  [place]  [place] [address]  Fax: [phone]

## 2011-03-17 NOTE — Discharge Summary (Signed)
   NAME:  Gloria [name], Gloria [name]                           ACCOUNT NO.:  [id number]   MEDICAL RECORD NO.:  [id number]                   PATIENT TYPE:  INP   LOCATION:  2010                                 FACILITY:  [facility]   PHYSICIAN:  Cecil Cranker, M.D. [facility]         DATE OF BIRTH:  07-14-1928   DATE OF ADMISSION:  09/03/2002  DATE OF DISCHARGE:  09/06/2002                           DISCHARGE SUMMARY - REFERRING   DISCHARGE DIAGNOSES:  1. Chest pain, felt to be noncardiac.  2. Elevated liver function tests.  3. History of mitral valve prolapse.  4. History of diverticulitis.  5. Gastroesophageal reflux disease.  6. Chronic sinusitis.  7. Remote tobacco use.   HOSPITAL COURSE:  The patient is a 77 year old female patient of Dr. Glennon Hamilton, who was admitted on 09/03/2002 with substernal chest pain.  She  eventually underwent cardiac catheterization on 09/05/2002 and was found to  have no evidence of angina and vascularly significant coronary artery  disease.  She had a normal LV-gram with an EF calculated at 70%.  Her lab  work did show an AST of 44, an ALT of 69, with her other liver functions  being normal.  Cardiac enzymes and troponins were negative.  White count  3.8, hemoglobin 14.1, and hematocrit 41.9, platelets 203.  Total cholesterol  1019, triglycerides 154, HDL 53, LDL 35.   The patient did undergo an abdominal ultrasound to evaluate for  cholelithiasis.  There was no evidence of gallstones, however, a fatty liver  was identified.   Because she was stable 24 hours post cath, we did prepare for her discharge  to home.  She will need to follow up with Dr. Merril Abbe with her liver  function tests or with her GI physician.  She will be discharged to home on  the same medications as on admission, which include Doxepin 10 mg q.h.s.,  Xanax 0.25 mg q.h.s., Allegra as needed and Nexium.   She is to decrease activity for 2 days, then gradually increase activity,  remain on low  fat diet, clean over cath site with soap and water, call for  questions or concerns, and please call Dr. Merril Abbe for followup  appointment.     Guy Franco, P.A. [facility]                      E. Graceann Congress, M.D. [facility]    LB/MEDQ  D:  09/06/2002  T:  09/07/2002  Job:  [id number]   cc:   Ike Bene, M.D.  [address]  [place]  [place] [address]  Fax: [phone]   E. Graceann Congress, M.D. [facility]   Vania Rea. Jarold Motto, M.D. [facility]

## 2011-03-17 NOTE — Cardiovascular Report (Signed)
   NAMEDAPHANIE, Gloria [name]                             ACCOUNT NO.:  [id number]   MEDICAL RECORD NO.:  [id number]                   PATIENT TYPE:   LOCATION:                                       FACILITY:   PHYSICIAN:  Arturo Morton. Riley Kill, M.D. [facility]         DATE OF BIRTH:  1927/11/20   DATE OF PROCEDURE:  09/05/2002  DATE OF DISCHARGE:  09/06/2002                              CARDIAC CATHETERIZATION   INDICATIONS FOR PROCEDURE:  The patient is a 77 year old who presents with  chest pain.  She was seen in consultation by Dr. Corinda Gubler and subsequently  referred for diagnostic cardiac catheterization.   PROCEDURE:  1. Left heart catheterization.  2. Selective coronary arteriography.  3. Selective left ventriculography.   DESCRIPTION OF PROCEDURE:  The procedure was performed from the right  femoral artery using #6 French catheters.  She tolerated the procedure well  and there were no complications.   HEMODYNAMIC DATA:  1. Central aortic pressure 172/79, mean 118.  2. Left ventricle 173/0/6.  3. No aortic left ventricular gradient on pullback across the aortic valve.   ANGIOGRAPHIC DATA:  1. Ventriculography was performed in the RAO projection.  Overall systolic     function was well preserved.  Ejection fraction was calculated at 70%.  2. The left main coronary artery was smooth and free of critical disease.  3. The left anterior descending artery coursed to the apex.  There was one     major diagonal branch.  The LAD appeared to be free of significant     disease.  There is minimal luminal irregularity in the distal aspect of     the vessel.  4. A ramus intermedius vessel was fairly large and free of critical disease.  5. The circumflex consists of one marginal branch which is free of critical     disease.  6. The right coronary artery was a large caliber smooth vessel that provides     an acute marginal branch, a very small posterior descending system, and a     large posterolateral  vessel.  The RCA is entirely smooth throughout     without significant focal narrowing.    DISPOSITION:  The patient has not had further chest pain.  We will obtain a  d-dimer with morning labs.  The patient will need follow up with liver  function studies.                                               Arturo Morton. Riley Kill, M.D. [facility]    TDS/MEDQ  D:  09/05/2002  T:  09/07/2002  Job:  [id number]   cc:   Cecil Cranker, M.D. [facility]   Cardiac Catheterization Lab

## 2011-03-17 NOTE — Assessment & Plan Note (Signed)
[facility]                         GASTROENTEROLOGY OFFICE NOTE   JABREA, KALLSTROM                        MRN:          [id number]  DATE:10/09/2006                            DOB:          1928/09/17    Tuesday, October 09, 2006   Garrie is having some dysphagia and increased hiccups despite taking  Nexium 40 mg a day.  She continues with abdominal gas and bloating but  denies rectal bleeding or severe diarrhea.  She has chronic left lower  quadrant pain related to her intestinal adhesions.   She weighs 143 pounds, blood pressure is 138/80.  Abdominal exam was  basically unremarkable.   ASSESSMENT:  1. Chronic acid reflux with probable recurrent peptic stricture of the      esophagus.  Her last endoscopic exam was in 2003.  2. Family history of colon cancer in her grandmother at age 68.  She      is due for follow-up colonoscopy next year.  3. Probable bacterial overgrowth syndrome with element of lactose      intolerance - consider treatment with Xifaxan and Align.  4. Status post sigmoid resection for diverticulitis.  5. Chronic anxiety syndrome.  History of Doxepin 10 mg at bedtime and      Xanax 0.25 mg at bedtime on a chronic basis.     Vania Rea. Jarold Motto, MD, Caleen Essex, FAGA  Electronically Signed    DRP/MedQ  DD: 10/09/2006  DT: 10/09/2006  Job #: [id number]   cc:   Ike Bene, M.D.

## 2011-05-09 ENCOUNTER — Encounter (HOSPITAL_BASED_OUTPATIENT_CLINIC_OR_DEPARTMENT_OTHER): Payer: Medicare Other | Admitting: Oncology

## 2011-05-09 ENCOUNTER — Other Ambulatory Visit: Payer: Self-pay | Admitting: Oncology

## 2011-05-09 DIAGNOSIS — M810 Age-related osteoporosis without current pathological fracture: Secondary | ICD-10-CM

## 2011-05-09 DIAGNOSIS — C50119 Malignant neoplasm of central portion of unspecified female breast: Secondary | ICD-10-CM

## 2011-05-09 LAB — CBC WITH DIFFERENTIAL/PLATELET
Basophils Absolute: 0 10*3/uL (ref 0.0–0.1)
EOS%: 7.3 % — ABNORMAL HIGH (ref 0.0–7.0)
HCT: 36.9 % (ref 34.8–46.6)
HGB: 12.7 g/dL (ref 11.6–15.9)
LYMPH%: 19.1 % (ref 14.0–49.7)
MCH: 31.9 pg (ref 25.1–34.0)
MCV: 92.7 fL (ref 79.5–101.0)
MONO%: 8.8 % (ref 0.0–14.0)
NEUT%: 64.2 % (ref 38.4–76.8)

## 2011-05-09 LAB — COMPREHENSIVE METABOLIC PANEL
AST: 22 U/L (ref 0–37)
Alkaline Phosphatase: 55 U/L (ref 39–117)
BUN: 15 mg/dL (ref 6–23)
Calcium: 9 mg/dL (ref 8.4–10.5)
Creatinine, Ser: 0.85 mg/dL (ref 0.50–1.10)
Total Bilirubin: 0.5 mg/dL (ref 0.3–1.2)

## 2011-05-18 ENCOUNTER — Encounter: Payer: Medicare Other | Admitting: Oncology

## 2011-05-19 ENCOUNTER — Other Ambulatory Visit: Payer: Self-pay | Admitting: Oncology

## 2011-05-19 DIAGNOSIS — Z853 Personal history of malignant neoplasm of breast: Secondary | ICD-10-CM

## 2011-06-12 ENCOUNTER — Ambulatory Visit (INDEPENDENT_AMBULATORY_CARE_PROVIDER_SITE_OTHER): Payer: Medicare Other | Admitting: General Surgery

## 2011-06-12 ENCOUNTER — Other Ambulatory Visit (INDEPENDENT_AMBULATORY_CARE_PROVIDER_SITE_OTHER): Payer: Self-pay | Admitting: General Surgery

## 2011-06-12 ENCOUNTER — Encounter (INDEPENDENT_AMBULATORY_CARE_PROVIDER_SITE_OTHER): Payer: Self-pay | Admitting: General Surgery

## 2011-06-12 DIAGNOSIS — Z9012 Acquired absence of left breast and nipple: Secondary | ICD-10-CM

## 2011-06-12 DIAGNOSIS — C50919 Malignant neoplasm of unspecified site of unspecified female breast: Secondary | ICD-10-CM

## 2011-06-12 DIAGNOSIS — N6320 Unspecified lump in the left breast, unspecified quadrant: Secondary | ICD-10-CM

## 2011-06-12 NOTE — Patient Instructions (Addendum)
U/S left axilla Mammogram of right breast due soon

## 2011-06-15 ENCOUNTER — Encounter (INDEPENDENT_AMBULATORY_CARE_PROVIDER_SITE_OTHER): Payer: Self-pay | Admitting: General Surgery

## 2011-06-15 NOTE — Progress Notes (Signed)
Subjective:     Patient ID: Gloria [name], female   DOB: Jan 31, 1928, 77 y.o.   MRN: [id number]  HPI The patient is an 77 year old white female who is now 9 lungs from a left mastectomy and axillary node dissection for a T2 N1 left breast cancer. Her postoperative course was complicated by a seroma which is now resolved. She finished radiation in March and seems to be doing well. She has no real complaints today. Her appetite is good . Her bowels are working normally.  Review of Systems     Objective:   Physical Exam On exam Lungs: Clear bilaterally with no use of assesory respiratory muscles Heart: Regular rate and rhythm with an impulse in the left chest Abdomen: Soft and nontender with no palpable mass or hepatosplenomegaly Breasts: Her left chest wall incision has healed up nicely. There is no palpable evidence of seroma. She has no palpable mass of the left chest wall. No palpable mass of the right breast. She does have a small palpable firmness in the left axilla.    Assessment:     9 months out from a left mastectomy and axillary node dissection    Plan:     At this point I would prefer to get an ultrasound of the left axilla to evaluate the palpable mass. We will obtain the results of this study and if it looks negative then we'll plan to see her back in about 3 months.

## 2011-06-21 ENCOUNTER — Encounter (INDEPENDENT_AMBULATORY_CARE_PROVIDER_SITE_OTHER): Payer: Self-pay | Admitting: General Surgery

## 2011-06-21 ENCOUNTER — Other Ambulatory Visit: Payer: Self-pay | Admitting: Oncology

## 2011-06-21 DIAGNOSIS — Z9012 Acquired absence of left breast and nipple: Secondary | ICD-10-CM

## 2011-07-05 ENCOUNTER — Ambulatory Visit
Admission: RE | Admit: 2011-07-05 | Discharge: 2011-07-05 | Disposition: A | Discharge status: Home / Self Care | Payer: Medicare Other | Source: Ambulatory Visit | Attending: General Surgery | Admitting: General Surgery

## 2011-07-05 DIAGNOSIS — Z9012 Acquired absence of left breast and nipple: Secondary | ICD-10-CM

## 2011-07-05 DIAGNOSIS — N6320 Unspecified lump in the left breast, unspecified quadrant: Secondary | ICD-10-CM

## 2011-07-20 LAB — LIPID PANEL
Cholesterol: 129
HDL: 62
Total CHOL/HDL Ratio: 2.1
VLDL: 16

## 2011-07-20 LAB — MAGNESIUM: Magnesium: 2.2

## 2011-07-20 LAB — I-STAT 8, (EC8 V) (CONVERTED LAB)
Acid-Base Excess: 4 — ABNORMAL HIGH
Chloride: 106
Glucose, Bld: 99
Hemoglobin: 13.9
Potassium: 3.6
Sodium: 140
TCO2: 30
pH, Ven: 7.43 — ABNORMAL HIGH

## 2011-07-20 LAB — BASIC METABOLIC PANEL
BUN: 17
Calcium: 8.7
Calcium: 8.8
Calcium: 8.9
Creatinine, Ser: 0.94
GFR calc Af Amer: 55 — ABNORMAL LOW
GFR calc Af Amer: >60
GFR calc non Af Amer: 45 — ABNORMAL LOW
GFR calc non Af Amer: 48 — ABNORMAL LOW
GFR calc non Af Amer: 58 — ABNORMAL LOW
Glucose, Bld: 112 — ABNORMAL HIGH
Glucose, Bld: 97
Sodium: 137
Sodium: 140

## 2011-07-20 LAB — POCT CARDIAC MARKERS: Operator id: 294521

## 2011-07-20 LAB — CK TOTAL AND CKMB (NOT AT ARMC)
Relative Index: INVALID
Total CK: 57

## 2011-07-20 LAB — CBC
HCT: 39.3
MCV: 93.2
Platelets: 171
RBC: 4.22
WBC: 5

## 2011-07-20 LAB — CARDIAC PANEL(CRET KIN+CKTOT+MB+TROPI)
CK, MB: 0.9
Relative Index: INVALID
Total CK: 50
Troponin I: 0.04
Troponin I: <0.01

## 2011-07-20 LAB — TROPONIN I: Troponin I: 0.02

## 2011-07-20 LAB — HEPATIC FUNCTION PANEL
Albumin: 3.4 — ABNORMAL LOW
Indirect Bilirubin: 0.5
Total Protein: 5.8 — ABNORMAL LOW

## 2011-07-20 LAB — POCT I-STAT CREATININE: Operator id: 294521

## 2011-08-07 ENCOUNTER — Ambulatory Visit
Admission: RE | Admit: 2011-08-07 | Discharge: 2011-08-07 | Disposition: A | Discharge status: Home / Self Care | Payer: Medicare Other | Source: Ambulatory Visit | Attending: Oncology | Admitting: Oncology

## 2011-08-07 DIAGNOSIS — Z9012 Acquired absence of left breast and nipple: Secondary | ICD-10-CM

## 2011-08-15 ENCOUNTER — Encounter (INDEPENDENT_AMBULATORY_CARE_PROVIDER_SITE_OTHER): Payer: Self-pay | Admitting: General Surgery

## 2011-08-15 ENCOUNTER — Ambulatory Visit (INDEPENDENT_AMBULATORY_CARE_PROVIDER_SITE_OTHER): Payer: Medicare Other | Admitting: General Surgery

## 2011-08-15 VITALS — BP 118/80 | HR 80 | Temp 98.4°F | Resp 12 | Ht 61.0 in | Wt 139.6 lb

## 2011-08-15 DIAGNOSIS — C50919 Malignant neoplasm of unspecified site of unspecified female breast: Secondary | ICD-10-CM

## 2011-08-15 NOTE — Patient Instructions (Addendum)
F/U with med doc or heart doc for chest squeezing. 3:30 today with Dr. Tresa Endo

## 2011-08-17 ENCOUNTER — Ambulatory Visit
Admission: RE | Admit: 2011-08-17 | Discharge: 2011-08-17 | Disposition: A | Discharge status: Home / Self Care | Payer: Medicare Other | Source: Ambulatory Visit | Attending: Cardiovascular Disease | Admitting: Cardiovascular Disease

## 2011-08-17 ENCOUNTER — Other Ambulatory Visit: Payer: Self-pay | Admitting: Cardiovascular Disease

## 2011-08-17 NOTE — Progress Notes (Signed)
Subjective:     Patient ID: Gloria [name], female   DOB: 1928-04-28, 77 y.o.   MRN: [id number]  HPI The patient is an 77 year old white female who is now about 11 months out from a left mastectomy and axillary node dissection for a T2 N1 left breast cancer. At her last visit we noticed a lump under her left arm. This was evaluated with ultrasound and was shown to be a small seroma. Her main complaint today is of a feeling of chest tightness and squeezing it has been progressing over the last couple months. She does have a cardiologist who is Dr. Nicholaus Bloom.  Review of Systems  Constitutional: Negative.   HENT: Negative.   Eyes: Negative.   Respiratory: Positive for chest tightness.   Cardiovascular: Positive for chest pain.  Gastrointestinal: Negative.   Genitourinary: Negative.   Musculoskeletal: Negative.   Skin: Negative.   Neurological: Negative.   Hematological: Negative.   Psychiatric/Behavioral: Negative.        Objective:   Physical Exam  Constitutional: She is oriented to person, place, and time. She appears well-developed and well-nourished.  HENT:  Head: Normocephalic and atraumatic.  Eyes: Conjunctivae and EOM are normal. Pupils are equal, round, and reactive to light.  Neck: Normal range of motion. Neck supple.  Cardiovascular: Normal rate and regular rhythm.   Pulmonary/Chest: Effort normal and breath sounds normal.       The patient has no palpable mass of the left chest wall. Her mastectomy incision is healed nicely. She has no palpable mass of the right breast. She still has a small palpable seroma in the left axilla which is stable. No other axillary supraclavicular or cervical lymphadenopathy.  Abdominal: Soft. Bowel sounds are normal.  Musculoskeletal: Normal range of motion.  Neurological: She is alert and oriented to person, place, and time.  Skin: Skin is warm and dry.  Psychiatric: She has a normal mood and affect. Her behavior is normal.       Assessment:       11 months status post left mastectomy and axillary node dissection    Plan:     At this point from a breast cancer standpoint she seems stable. I am concerned about the chest tightness that is been progressing. I've call Dr. Michel Harrow office and he has agreed to see her this afternoon.We will plan to see her back in about 3 months.

## 2011-08-31 HISTORY — PX: CARDIAC CATHETERIZATION: SHX172

## 2011-09-07 ENCOUNTER — Encounter (HOSPITAL_COMMUNITY): Payer: Self-pay | Admitting: Pharmacy Technician

## 2011-09-08 ENCOUNTER — Encounter (HOSPITAL_COMMUNITY): Payer: Self-pay

## 2011-09-08 ENCOUNTER — Encounter (HOSPITAL_COMMUNITY)
Admission: RE | Disposition: A | Discharge status: Home / Self Care | Payer: Self-pay | Source: Ambulatory Visit | Attending: Cardiovascular Disease

## 2011-09-08 ENCOUNTER — Ambulatory Visit (HOSPITAL_COMMUNITY)
Admission: RE | Admit: 2011-09-08 | Discharge: 2011-09-08 | Disposition: A | Discharge status: Home / Self Care | Payer: Medicare Other | Source: Ambulatory Visit | Attending: Cardiovascular Disease | Admitting: Cardiovascular Disease

## 2011-09-08 DIAGNOSIS — R079 Chest pain, unspecified: Secondary | ICD-10-CM | POA: Insufficient documentation

## 2011-09-08 HISTORY — PX: ABDOMINAL AORTAGRAM: SHX5454

## 2011-09-08 HISTORY — PX: LEFT HEART CATHETERIZATION WITH CORONARY ANGIOGRAM: SHX5451

## 2011-09-08 SURGERY — LEFT HEART CATHETERIZATION WITH CORONARY ANGIOGRAM
Anesthesia: LOCAL

## 2011-09-08 MED ORDER — DIPHENHYDRAMINE HCL 50 MG/ML IJ SOLN
INTRAMUSCULAR | Status: AC
  Administered 2011-09-08: 25 mg
  Filled 2011-09-08: qty 1

## 2011-09-08 MED ORDER — DIPHENHYDRAMINE HCL 50 MG/ML IJ SOLN: 25.0000 mg | Freq: Once | INTRAMUSCULAR | Status: DC

## 2011-09-08 MED ORDER — DIAZEPAM 5 MG PO TABS: 5.0000 mg | ORAL_TABLET | ORAL | Status: DC

## 2011-09-08 MED ORDER — DIAZEPAM 5 MG PO TABS
ORAL_TABLET | ORAL | Status: AC
  Administered 2011-09-08: 5 mg
  Filled 2011-09-08: qty 1

## 2011-09-08 MED ORDER — ONDANSETRON HCL 4 MG/2ML IJ SOLN: 4.0000 mg | Freq: Four times a day (QID) | INTRAMUSCULAR | Status: DC | PRN

## 2011-09-08 MED ORDER — ASPIRIN 81 MG PO CHEW
324.0000 mg | CHEWABLE_TABLET | ORAL | Status: AC
  Administered 2011-09-08: 324 mg via ORAL

## 2011-09-08 MED ORDER — FAMOTIDINE IN NACL 20-0.9 MG/50ML-% IV SOLN
INTRAVENOUS | Status: AC
  Filled 2011-09-08: qty 50

## 2011-09-08 MED ORDER — SODIUM CHLORIDE 0.9 % IV SOLN: 250.0000 mL | INTRAVENOUS | Status: DC

## 2011-09-08 MED ORDER — SODIUM CHLORIDE 0.9 % IV SOLN: INTRAVENOUS | Status: DC

## 2011-09-08 MED ORDER — HEPARIN (PORCINE) IN NACL 2-0.9 UNIT/ML-% IJ SOLN
INTRAMUSCULAR | Status: AC
  Filled 2011-09-08: qty 2000

## 2011-09-08 MED ORDER — FAMOTIDINE IN NACL 20-0.9 MG/50ML-% IV SOLN
20.0000 mg | Freq: Once | INTRAVENOUS | Status: AC
  Administered 2011-09-08: 20 mg via INTRAVENOUS

## 2011-09-08 MED ORDER — NITROGLYCERIN 0.2 MG/ML ON CALL CATH LAB
INTRAVENOUS | Status: AC
  Filled 2011-09-08: qty 1

## 2011-09-08 MED ORDER — SODIUM CHLORIDE 0.9 % IJ SOLN: 3.0000 mL | INTRAMUSCULAR | Status: DC | PRN

## 2011-09-08 MED ORDER — FAMOTIDINE IN NACL 20-0.9 MG/50ML-% IV SOLN: 20.0000 mg | Freq: Two times a day (BID) | INTRAVENOUS | Status: DC

## 2011-09-08 MED ORDER — METHYLPREDNISOLONE SODIUM SUCC 125 MG IJ SOLR
INTRAMUSCULAR | Status: AC
  Filled 2011-09-08: qty 2

## 2011-09-08 MED ORDER — METHYLPREDNISOLONE SODIUM SUCC 125 MG IJ SOLR
125.0000 mg | Freq: Once | INTRAMUSCULAR | Status: AC
  Administered 2011-09-08: 125 mg via INTRAVENOUS

## 2011-09-08 MED ORDER — ACETAMINOPHEN 325 MG PO TABS: 650.0000 mg | ORAL_TABLET | ORAL | Status: DC | PRN

## 2011-09-08 MED ORDER — DEXTROSE-NACL 5-0.45 % IV SOLN
INTRAVENOUS | Status: DC
  Administered 2011-09-08: 09:00:00 via INTRAVENOUS

## 2011-09-08 MED ORDER — SODIUM CHLORIDE 0.9 % IJ SOLN: 3.0000 mL | Freq: Two times a day (BID) | INTRAMUSCULAR | Status: DC

## 2011-09-08 MED ORDER — FENTANYL CITRATE 0.05 MG/ML IJ SOLN
INTRAMUSCULAR | Status: AC
  Filled 2011-09-08: qty 2

## 2011-09-08 MED ORDER — MIDAZOLAM HCL 2 MG/2ML IJ SOLN
INTRAMUSCULAR | Status: AC
  Filled 2011-09-08: qty 2

## 2011-09-08 MED ORDER — ASPIRIN 81 MG PO CHEW
CHEWABLE_TABLET | ORAL | Status: AC
  Filled 2011-09-08: qty 4

## 2011-09-08 NOTE — Cardiovascular Report (Signed)
NAMEGENEVRA, ORNE NO.:  [id number]  MEDICAL RECORD NO.:  [id number]  LOCATION:  [facility]                         FACILITY:  [facility]  PHYSICIAN:  Nicki Guadalajara, M.D.     DATE OF BIRTH:  1927-11-21  DATE OF PROCEDURE:  09/08/2011 DATE OF DISCHARGE:                           CARDIAC CATHETERIZATION   INDICATION:  Ms. Gloria [name] is an 77 year old female who has history of hypertension.  She has experienced episodic chest discomfort.  Remotely, she apparently had undergone cardiac catheterizations approximately 10 years ago.  In 2011, she underwent left mastectomy for breast cancer. She did undergo radiation treatment following her mastectomy.  Several weeks ago, I had seen her as an add on for 4 episodes of chest squeezing and tightness.  She underwent a nuclear perfusion study, which was done on August 30, 2011.  During the [facility] study, she experienced chest heaviness and pressure.  Scintigraphic images raised the possibility of a medium in size, moderate in intensity perfusion defect in the inferoseptal inferior wall raising the possibility of RCA lesion, although diaphragmatic attenuation could not be completely excluded. Subsequently, the patient has experienced recurrent episodes of chest heaviness, which she states occur with activity and improves with rest. She was seen in the office on September 06, 2011, because of recurrent symptomatology, definitive cardiac catheterization was recommended.  PROCEDURE:  After premedication with Versed 1 mg plus fentanyl 25 mcg, the patient was prepped and draped in usual fashion.  Her right femoral artery was punctured anteriorly and a 5-French sheath was inserted without difficulty.  Diagnostic catheterization was done utilizing 5- [place] Judkins 4 right and left coronary catheters.  200 mcg intracoronary nitroglycerin was administered into the left coronary system to further evaluate her smooth proximal LAD  narrowing.  A right catheter was used for selective angiography of the right coronary artery.  A 5-French pigtail catheter was used for RAO ventriculography. With the patient's hypertensive history, distal aortography was also performed to make certain she does not have any renovascular etiology due to her high blood pressure.  She tolerated the procedure well. Hemostasis was obtained by direct manual pressure.  HEMODYNAMIC DATA:  Central aortic pressure was 122/75.  Left ventricular pressure 122/12.  ANGIOGRAPHIC DATA:  Left main coronary artery was angiographically normal.  It trifurcated into an LAD, a ramus intermediate vessel, and left circumflex coronary artery.  The proximal LAD had smooth narrowing of 20%, before the first septal perforating artery.  There was 20% smooth ostial narrowing of the first diagonal branch of the LAD.  The remainder of the LAD was angiographically normal and wrapped around the LV apex.  The diminutive branch apically was mildly narrowed.  The ramus intermediate vessel was angiographically normal.  The circumflex coronary artery was angiographically normal.  The right coronary artery was large dominant vessel.  There were luminal irregularities proximally with narrowing of 10% initially and then 20% in the region of the proximal bend.  The RCA supplied a large PDA system.  RAO ventriculography demonstrated normal LV contractility with an ejection fraction of at least 60% without focal segmental wall motion abnormalities.  Distal aortography revealed widely patent renal arteries without  evidence for stenosis.  There was mild tortuosity of the aorta without stenosis and otherwise a normal aortoiliac system.  IMPRESSION: 1. Normal left ventricular function. 2. Smooth nonobstructive coronary artery disease involving the left     anterior descending 20% proximally and 20% at the ostium of the     diagonal not improved following IC nitroglycerin  administration. 3. Luminal irregularities of 10% to 20% in the proximal dominant right     coronary artery. 4. Normal ramus intermediate and normal left circumflex coronary     systems.  RECOMMENDATION:  Medical therapy.          ______________________________ Nicki Guadalajara, M.D.     TK/MEDQ  D:  09/08/2011  T:  09/08/2011  Job:  [id number]  cc:   Hal T. Stoneking, M.D. [name]. [name], M.D.

## 2011-09-08 NOTE — H&P (Signed)
Date of Initial H&P 09/06/11  History reviewed, patient examined, no change in status, stable for cardiac catherization.  Shooter Tangen A 09/08/2011 12:11 PM

## 2011-09-08 NOTE — Op Note (Signed)
Cardiac cath note dictated. [phone]) CSN [id number] Allure Greaser A 09/08/2011 12:53 PM

## 2011-09-10 LAB — URINE CULTURE
Colony Count: >=100000
Culture  Setup Time: 201211091934

## 2011-09-11 ENCOUNTER — Encounter (HOSPITAL_COMMUNITY): Payer: Self-pay

## 2011-10-27 ENCOUNTER — Encounter: Payer: Self-pay | Admitting: *Deleted

## 2011-10-27 ENCOUNTER — Other Ambulatory Visit (HOSPITAL_BASED_OUTPATIENT_CLINIC_OR_DEPARTMENT_OTHER): Payer: Medicare Other | Admitting: Lab

## 2011-10-27 ENCOUNTER — Other Ambulatory Visit: Payer: Self-pay | Admitting: Oncology

## 2011-10-27 DIAGNOSIS — C50119 Malignant neoplasm of central portion of unspecified female breast: Secondary | ICD-10-CM

## 2011-10-27 DIAGNOSIS — M810 Age-related osteoporosis without current pathological fracture: Secondary | ICD-10-CM

## 2011-10-27 LAB — CBC WITH DIFFERENTIAL/PLATELET
BASO%: 0.7 % (ref 0.0–2.0)
EOS%: 7.4 % — ABNORMAL HIGH (ref 0.0–7.0)
HCT: 39.5 % (ref 34.8–46.6)
LYMPH%: 22.6 % (ref 14.0–49.7)
MCH: 31.8 pg (ref 25.1–34.0)
MCHC: 34.2 g/dL (ref 31.5–36.0)
MONO#: 0.2 10*3/uL (ref 0.1–0.9)
NEUT%: 61.7 % (ref 38.4–76.8)
Platelets: 124 10*3/uL — ABNORMAL LOW (ref 145–400)

## 2011-10-27 NOTE — Progress Notes (Signed)
Spoke with pt regarding recent lab work. Pt is to se MD 11/07/10. WBC is 2.8. Pt reports no fever, coughing,or  Generalized viral sx. Pt understands to call this desk or the nearest ED if sx are noted over the weekend

## 2011-10-30 LAB — COMPREHENSIVE METABOLIC PANEL
ALT: 12 U/L (ref 0–35)
CO2: 27 mEq/L (ref 19–32)
Creatinine, Ser: 0.91 mg/dL (ref 0.50–1.10)
Total Bilirubin: 0.4 mg/dL (ref 0.3–1.2)

## 2011-10-30 LAB — CANCER ANTIGEN 27.29: CA 27.29: 25 U/mL (ref 0–39)

## 2011-11-02 ENCOUNTER — Telehealth: Payer: Self-pay | Admitting: Oncology

## 2011-11-02 ENCOUNTER — Ambulatory Visit (HOSPITAL_BASED_OUTPATIENT_CLINIC_OR_DEPARTMENT_OTHER): Payer: Medicare Other | Admitting: Oncology

## 2011-11-02 VITALS — BP 131/83 | HR 93 | Temp 97.7°F | Ht 71.0 in | Wt 138.2 lb

## 2011-11-02 DIAGNOSIS — D72819 Decreased white blood cell count, unspecified: Secondary | ICD-10-CM

## 2011-11-02 DIAGNOSIS — D696 Thrombocytopenia, unspecified: Secondary | ICD-10-CM

## 2011-11-02 DIAGNOSIS — Z170 Estrogen receptor positive status [ER+]: Secondary | ICD-10-CM | POA: Diagnosis not present

## 2011-11-02 DIAGNOSIS — E559 Vitamin D deficiency, unspecified: Secondary | ICD-10-CM

## 2011-11-02 DIAGNOSIS — C50919 Malignant neoplasm of unspecified site of unspecified female breast: Secondary | ICD-10-CM

## 2011-11-02 NOTE — Telephone Encounter (Signed)
Gv pt appt for [id number] °

## 2011-11-02 NOTE — Progress Notes (Signed)
Hematology and Oncology Follow Up Visit  Gloria [name] [id number] 03/26/1928 78 y.o. 11/02/2011 2:07 PM PCP dr Nicki Guadalajara; hal stoneking; pj toth  Principle Diagnosis: .  History of adenocarcinoma with lobular features status post biopsy with trial of neoadjuvant Femara therapy, status post lumpectomy and sentinel lymph node evaluation on 09/13/2010 with residual T2, N1, breast cancer, status post radiation therapy to the breast, completed on 01/23/2011, currently on tamoxifen  Interim History:  There have been no intercurrent illness, hospitalizations or medication changes.completed course of levaquin, still have UTI symptoms.  Medications: I have reviewed the patient's current medications.  Allergies:  Allergies  Allergen Reactions  . Contrast Media (Iodinated Diagnostic Agents) Anaphylaxis  . Adhesive (Tape) Other (See Comments)    blisters  . Cephalexin Swelling  . Ciprofloxacin Swelling  . Demerol Nausea And Vomiting  . Penicillins Swelling    REACTION: allergic to penicillin  . Sulfamethoxazole W/Trimethoprim Itching and Swelling    REACTION: swelling/hives  . Quinolones Itching and Rash    REACTION: itching, rash    Past Medical History, Surgical history, Social history, and Family History were reviewed and updated.  Review of Systems: Constitutional:  Negative for fever, chills, night sweats, anorexia, weight loss, c/o lt sided cw pain. Cardiovascular: no chest pain or dyspnea on exertion Respiratory: no cough, shortness of breath, or wheezing Neurological: no TIA or stroke symptoms Dermatological: negative ENT: negative Skin Gastrointestinal: no abdominal pain, change in bowel habits, or black or bloody stools Genito-Urinary: no dysuria, trouble voiding, or hematuria Hematological and Lymphatic: negative Breast: negative for breast lumps Musculoskeletal: negative Remaining ROS negative.  Physical Exam: Blood pressure 131/83, pulse 93, temperature 97.7 F (36.5  C), height 5\' 11"  (1.803 m), weight 138 lb 3.2 oz (62.687 kg). ECOG:  General appearance: alert, cooperative and appears stated age Head: Normocephalic, without obvious abnormality, atraumatic Neck: no adenopathy, no carotid bruit, no JVD, supple, symmetrical, trachea midline and thyroid not enlarged, symmetric, no tenderness/mass/nodules Lymph nodes: Cervical, supraclavicular, and axillary nodes normal. Cardiac : regular rate and rhythm, no murmurs or gallops Pulmonary:clear to auscultation bilaterally and normal percussion bilaterally Breasts: inspection negative, no nipple discharge or bleeding, no masses or nodularity palpable tender lt breast /axilla, no masses Abdomen:soft, non-tender; bowel sounds normal; no masses,  no organomegaly Extremities negative Neuro: alert, oriented, normal speech, no focal findings or movement disorder noted  Lab Results: Lab Results  Component Value Date   WBC 2.8* 10/27/2011   HGB 13.5 10/27/2011   HCT 39.5 10/27/2011   MCV 92.9 10/27/2011   PLT 124* 10/27/2011     Chemistry      Component Value Date/Time   NA 144 10/27/2011 1059   K 3.7 10/27/2011 1059   CL 107 10/27/2011 1059   CO2 27 10/27/2011 1059   BUN 16 10/27/2011 1059   CREATININE 0.91 10/27/2011 1059      Component Value Date/Time   CALCIUM 8.8 10/27/2011 1059   ALKPHOS 49 10/27/2011 1059   AST 23 10/27/2011 1059   ALT 12 10/27/2011 1059   BILITOT 0.4 10/27/2011 1059      .pathology. Radiological Studies: chest X-ray n/a Mammogram Due 10/13 Bone density n/a  Impression and Plan: Locally advanced breast cancer , er+, on neoadjuvant letrozole s/p lumpectomy for T2N1 breast cancer, now on tamoxifen. No evidence of disease. F/u with 6 months, with imaging Nb.. Repeat cbc in 1 month iven slight leukopenia/thrombocytopenia  More than 50% of the visit was spent in patient-related counselling  Pierce Crane, MD 1/3/20132:07 PM

## 2011-11-15 ENCOUNTER — Ambulatory Visit (INDEPENDENT_AMBULATORY_CARE_PROVIDER_SITE_OTHER): Payer: Medicare Other | Admitting: General Surgery

## 2011-11-15 ENCOUNTER — Encounter (INDEPENDENT_AMBULATORY_CARE_PROVIDER_SITE_OTHER): Payer: Self-pay | Admitting: General Surgery

## 2011-11-15 VITALS — BP 118/70 | HR 68 | Temp 97.6°F | Resp 16 | Ht 61.0 in | Wt 136.6 lb

## 2011-11-15 DIAGNOSIS — C50919 Malignant neoplasm of unspecified site of unspecified female breast: Secondary | ICD-10-CM

## 2011-11-15 NOTE — Patient Instructions (Signed)
Continue regular self exams  

## 2011-11-20 ENCOUNTER — Encounter (INDEPENDENT_AMBULATORY_CARE_PROVIDER_SITE_OTHER): Payer: Self-pay | Admitting: General Surgery

## 2011-11-20 NOTE — Progress Notes (Signed)
Subjective:     Patient ID: Gloria [name], female   DOB: 1928/02/01, 78 y.o.   MRN: [id number]  HPI The patient is an 78 year old white female who is now just over a year out from a left mastectomy and axillary node dissection. Since her last visit her shortness of breath and chest tightness has resolved. She only gets some occasional sharp pains in the left chest wall but very infrequently.  Review of Systems  Constitutional: Negative.   HENT: Negative.   Eyes: Negative.   Respiratory: Negative.   Cardiovascular: Negative.   Gastrointestinal: Negative.   Genitourinary: Negative.   Musculoskeletal: Negative.   Skin: Negative.   Neurological: Negative.   Hematological: Negative.   Psychiatric/Behavioral: Negative.        Objective:   Physical Exam  Constitutional: She is oriented to person, place, and time. She appears well-developed and well-nourished.  HENT:  Head: Normocephalic and atraumatic.  Eyes: Conjunctivae and EOM are normal. Pupils are equal, round, and reactive to light.  Neck: Normal range of motion. Neck supple.  Cardiovascular: Normal rate, regular rhythm and normal heart sounds.   Pulmonary/Chest: Effort normal and breath sounds normal.       With no palpable mass of the left chest wall. No palpable mass in the right breast. She still has a small palpable seroma in the left axilla. No palpable axillary supraclavicular or cervical lymphadenopathy  Abdominal: Soft. Bowel sounds are normal. She exhibits no mass. There is no tenderness.  Musculoskeletal: Normal range of motion.  Neurological: She is alert and oriented to person, place, and time.  Skin: Skin is warm and dry.  Psychiatric: She has a normal mood and affect. Her behavior is normal.       Assessment:     Just over a year out from a left mastectomy and axillary lymph node dissection    Plan:     At this point we will plan to see her back in another 3 months. She will continue to do regular self  exams.

## 2011-11-27 DIAGNOSIS — H35369 Drusen (degenerative) of macula, unspecified eye: Secondary | ICD-10-CM | POA: Diagnosis not present

## 2011-11-27 DIAGNOSIS — H35079 Retinal telangiectasis, unspecified eye: Secondary | ICD-10-CM | POA: Diagnosis not present

## 2011-11-27 DIAGNOSIS — H353190 Nonexudative age-related macular degeneration, unspecified eye, stage unspecified: Secondary | ICD-10-CM | POA: Diagnosis not present

## 2011-11-27 DIAGNOSIS — H35049 Retinal micro-aneurysms, unspecified, unspecified eye: Secondary | ICD-10-CM | POA: Diagnosis not present

## 2011-12-20 DIAGNOSIS — Z79899 Other long term (current) drug therapy: Secondary | ICD-10-CM | POA: Diagnosis not present

## 2011-12-20 DIAGNOSIS — Z0000 Encounter for general adult medical examination without abnormal findings: Secondary | ICD-10-CM | POA: Diagnosis not present

## 2011-12-20 DIAGNOSIS — M810 Age-related osteoporosis without current pathological fracture: Secondary | ICD-10-CM | POA: Diagnosis not present

## 2012-01-11 DIAGNOSIS — N760 Acute vaginitis: Secondary | ICD-10-CM | POA: Diagnosis not present

## 2012-01-22 ENCOUNTER — Telehealth: Payer: Self-pay | Admitting: Internal Medicine

## 2012-01-22 NOTE — Telephone Encounter (Signed)
lmomtcb x1--pt last seen 06/02/09

## 2012-01-23 DIAGNOSIS — J029 Acute pharyngitis, unspecified: Secondary | ICD-10-CM | POA: Diagnosis not present

## 2012-01-23 MED ORDER — ARFORMOTEROL TARTRATE 15 MCG/2ML IN NEBU: INHALATION_SOLUTION | RESPIRATORY_TRACT | Status: DC

## 2012-01-23 MED ORDER — BUDESONIDE 0.5 MG/2ML IN SUSP: RESPIRATORY_TRACT | Status: DC

## 2012-01-23 NOTE — Telephone Encounter (Signed)
Spoke with pt. She states needs brovana and pulmicort refilled through [facility]. I advised will refill x 1 only and needs to keep planned appt 02/06/12 for additional rx. Pt verbalized understanding and states will keep ov. Refills on nebs were faxed to [facility].

## 2012-01-24 ENCOUNTER — Telehealth: Payer: Self-pay | Admitting: Internal Medicine

## 2012-01-24 MED ORDER — BUDESONIDE 0.5 MG/2ML IN SUSP: RESPIRATORY_TRACT | Status: DC

## 2012-01-24 MED ORDER — ARFORMOTEROL TARTRATE 15 MCG/2ML IN NEBU: INHALATION_SOLUTION | RESPIRATORY_TRACT | Status: DC

## 2012-01-24 NOTE — Telephone Encounter (Signed)
Spoke with pt and she wants rxs for pulmicort and brovana to be sent to the [facility]. Rxs were sent x 1 only. Nothing further needed per pt.

## 2012-01-28 DIAGNOSIS — R05 Cough: Secondary | ICD-10-CM | POA: Diagnosis not present

## 2012-02-06 ENCOUNTER — Ambulatory Visit (INDEPENDENT_AMBULATORY_CARE_PROVIDER_SITE_OTHER): Payer: Medicare Other | Admitting: Internal Medicine

## 2012-02-06 ENCOUNTER — Encounter: Payer: Self-pay | Admitting: Internal Medicine

## 2012-02-06 VITALS — BP 128/72 | HR 75 | Temp 97.9°F | Ht 59.0 in | Wt 136.8 lb

## 2012-02-06 DIAGNOSIS — R0609 Other forms of dyspnea: Secondary | ICD-10-CM | POA: Diagnosis not present

## 2012-02-06 DIAGNOSIS — J449 Chronic obstructive pulmonary disease, unspecified: Secondary | ICD-10-CM

## 2012-02-06 DIAGNOSIS — R0600 Dyspnea, unspecified: Secondary | ICD-10-CM | POA: Insufficient documentation

## 2012-02-06 MED ORDER — TRAMADOL HCL 50 MG PO TABS: ORAL_TABLET | ORAL | Status: AC

## 2012-02-06 MED ORDER — PREDNISONE (PAK) 10 MG PO TABS: ORAL_TABLET | ORAL | Status: AC

## 2012-02-06 MED ORDER — FAMOTIDINE 20 MG PO TABS: ORAL_TABLET | ORAL | Status: DC

## 2012-02-06 NOTE — Progress Notes (Deleted)
adfdsa

## 2012-02-06 NOTE — Progress Notes (Signed)
Subjective:     Patient ID: Gloria [name], female   DOB: 07/29/1928  MRN: [id number]  HPI  84yowf quit smoking 1995 with refractory cough attributed to her asthmatic bronchitis and quite a bit better after changing over to nebulized inhalers in the form of brovana and budesonide. She did develop significant leg cramps which she attributed to [facility] and seemed better when she stopped it.   December 23, 2008 reports indolent onset of recurrent cough x sev months recurred if stop zyrtec and mucinex and worried about generic budesonide not be strong enough to control her coughing and wheezing.    Rec avoid ace, use neb up to twice daily with one half dose of brovana and full dose budesonide.      02/06/2012 f/u ov/Lonie Newsham cc cough x 2 weeks some better p levaquin completed on 4/7 with traces of brb and ear congestion and new sob on bud/brovana bid, also more trouble with hearing and persistently slt discolored mucus in am, gen post chest discomfort with coughing. No better with otc - c/o cough and congestion requiring zyrtec and mucinex at baseline maybe every other day x last 3 year s  Sleeping ok without nocturnal  or early am exacerbation  of respiratory  c/o's or need for noct saba. Also denies any obvious fluctuation of symptoms with weather or environmental changes or other aggravating or alleviating factors except as outlined above   ROS  At present neg for  any significant sore throat, dysphagia, dental problems, itching, sneezing,      fever, chills, sweats, unintended wt loss, pleuritic or exertional cp,  palpitations, orthopnea pnd or leg swelling.  Also denies presyncope, palpitations, heartburn, abdominal pain, anorexia, nausea, vomiting, diarrhea  or change in bowel or urinary habits, change in stools or urine, dysuria,hematuria,  rash, arthralgias, visual complaints, headache, numbness weakness or ataxia or problems with walking or coordination. No noted change in mood/affect or memory.       Allergies  1) ! Penicillin G Pot in Dextrose (Penicillin G Potassium in D5w)  2) ! Septra  3) ! * Quinolones  4) ! Doxycycline   Past Medical History:  Diverticulitis  Partial Colectomy  Chronic Anxiety Syndrome  Irritable Bowel Syndrome  G E R D  Chronic cough  - Better off ACE 11/2008  - PFT's nl 01/21/2008   Family History:  neg resp dz/ atopy   Social History:  Patient states former smoker. Quit 1995   Review of Systems     Objective:   Physical Exam    Depressed amb wf nad  Nasal tone to voice, congested cough    Wt  136 02/06/2012   HEENT: nl dentition, turbinates, and orophanx. Nl external ear canals without cough reflex   NECK :  without JVD/Nodes/TM/ nl carotid upstrokes bilaterally   LUNGS: no acc muscle use, clear to A and P bilaterally without cough on insp or exp maneuvers   CV:  RRR  no s3 or murmur or increase in P2, no edema   ABD:  soft and nontender with nl excursion in the supine position. No bruits or organomegaly, bowel sounds nl  MS:  warm without deformities, calf tenderness, cyanosis or clubbing  SKIN: warm and dry without lesions    NEURO:  alert, approp, no deficits   cxr 01/28/12 Copd/ no acute changes   Assessment:          Plan:

## 2012-02-06 NOTE — Patient Instructions (Addendum)
Take mucinex dm  1200mg  every 12 hour and supplement if needed with  tramadol 50 mg up to 1-2 every 4 hours to suppress the urge to cough and treat your chest discomfort as well.  Swallowing water or using ice chips/non mint and menthol containing candies (such as lifesavers or sugarless jolly ranchers) are also effective.  You should rest your voice and avoid activities that you know make you cough.  Once you have eliminated the cough for 3 straight days try reducing the tramadol first,  then the delsym as tolerated.    Prednisone 10 mg take  4 each am x 2 days,   2 each am x 2 days,  1 each am x2days and stop   Prilosec 20 mg Take 30- 60 min before your first and last meals of the day and Pepcid 20 mg at bedtime  Stop aspirin when you see any bleeding at all  See Tammy NP w/in 2 weeks with all your medications, even over the counter meds, separated in two separate bags, the ones you take no matter what vs the ones you stop once you feel better and take only as needed when you feel you need them.   Tammy  will generate for you a new user friendly medication calendar that will put [place] all on the same page re: your medication use.     Without this process, it simply isn't possible to assure that we are providing  your outpatient care  with  the attention to detail we feel you deserve.   If we cannot assure that you're getting that kind of care,  then we cannot manage your problem effectively from this clinic.  Once you have seen Tammy and we are sure that we're all on the same page with your medication use she will arrange follow up with me.

## 2012-02-08 NOTE — Assessment & Plan Note (Addendum)
Symptoms are markedly disproportionate to objective findings and not clear this is a lung problem but pt does appear to have difficult airway management issues. DDX of  difficult airways managment all start with A and  include Adherence, Ace Inhibitors, Acid Reflux, Active Sinus Disease, Alpha 1 Antitripsin deficiency, Anxiety masquerading as Airways dz,  ABPA,  allergy(esp in young), Aspiration (esp in elderly), Adverse effects of DPI,  Active smokers, plus two Bs  = Bronchiectasis and Beta blocker use..and one C= CHF  Adherence is always the initial "prime suspect" and is a multilayered concern that requires a "trust but verify" approach in every patient - starting with knowing how to use medications, especially inhalers, correctly, keeping up with refills and understanding the fundamental difference between maintenance and prns vs those medications only taken for a very short course and then stopped and not refilled. She will return to complete med reconciliation before next ov with me to make sure we're really on the same page in term of medication administration.  ?  Active sinus dz > sinus ct next step  ? Acid reflux > gerd rx reviewed.

## 2012-02-08 NOTE — Assessment & Plan Note (Signed)
-   02/06/2012  Walked RA x 3 laps @ 185 ft each stopped due to  End of study, no desat  Not able to reproduce this symptom in office today.

## 2012-02-13 ENCOUNTER — Other Ambulatory Visit: Payer: Self-pay | Admitting: *Deleted

## 2012-02-13 DIAGNOSIS — C50919 Malignant neoplasm of unspecified site of unspecified female breast: Secondary | ICD-10-CM

## 2012-02-13 MED ORDER — TAMOXIFEN CITRATE 20 MG PO TABS: 20.0000 mg | ORAL_TABLET | Freq: Every day | ORAL | Status: AC

## 2012-02-19 ENCOUNTER — Ambulatory Visit (INDEPENDENT_AMBULATORY_CARE_PROVIDER_SITE_OTHER): Payer: Medicare Other | Admitting: General Surgery

## 2012-02-20 ENCOUNTER — Encounter: Payer: Self-pay | Admitting: Adult Health

## 2012-02-20 ENCOUNTER — Ambulatory Visit (INDEPENDENT_AMBULATORY_CARE_PROVIDER_SITE_OTHER): Payer: Medicare Other | Admitting: Adult Health

## 2012-02-20 VITALS — BP 112/62 | HR 74 | Temp 97.0°F | Ht 60.0 in | Wt 140.8 lb

## 2012-02-20 DIAGNOSIS — J45909 Unspecified asthma, uncomplicated: Secondary | ICD-10-CM | POA: Diagnosis not present

## 2012-02-20 MED ORDER — BUDESONIDE 0.5 MG/2ML IN SUSP: RESPIRATORY_TRACT | Status: DC

## 2012-02-20 MED ORDER — ARFORMOTEROL TARTRATE 15 MCG/2ML IN NEBU: INHALATION_SOLUTION | RESPIRATORY_TRACT | Status: DC

## 2012-02-20 NOTE — Progress Notes (Signed)
Addended by: Boone Master E on: 02/20/2012 11:50 AM   Modules accepted: Orders

## 2012-02-20 NOTE — Assessment & Plan Note (Signed)
Recent flare -now resolved  Patient's medications were reviewed today and patient education was given. Computerized medication calendar was adjusted/completed follow up Dr. Sherene Sires  In 2 months and As needed

## 2012-02-20 NOTE — Progress Notes (Signed)
Subjective:     Patient ID: Gloria [name], female   DOB: September 11, 1928  MRN: [id number]  HPI 84yowf quit smoking 1995 with refractory cough attributed to her asthmatic bronchitis and quite a bit better after changing over to nebulized inhalers in the form of brovana and budesonide. She did develop significant leg cramps which she attributed to [facility] and seemed better when she stopped it.   December 23, 2008 reports indolent onset of recurrent cough x sev months recurred if stop zyrtec and mucinex and worried about generic budesonide not be strong enough to control her coughing and wheezing.    Rec avoid ace, use neb up to twice daily with one half dose of brovana and full dose budesonide.    02/06/2012 f/u ov/Wert cc cough x 2 weeks some better p levaquin completed on 4/7 with traces of brb and ear congestion and new sob on bud/brovana bid, also more trouble with hearing and persistently slt discolored mucus in am, gen post chest discomfort with coughing. No better with otc - c/o cough and congestion requiring zyrtec and mucinex at baseline maybe every other day x last 3 year s >>steroid taper and cough suppression regimen with tramadol.   02/20/2012 Follow up and med review.  Patient returns for a followup and medication review. We reviewed all her medications and organize them into her medication count with patient education.  She was unable to tolerate tramadol , so she has discontinued this. She is taking Prilosec once daily, and did not add in Pepcid.  Last OV , she had an asthmatic  flare , tx with steroid taper that she has now finished.  Since last visit. Patient is feeling much better. Her cough has totally resolved. She is currently taking Zyrtec and Mucinex DM on a daily basis. Feels that when she stops the that her cough returns.       Allergies  1) ! Penicillin G Pot in Dextrose (Penicillin G Potassium in D5w)  2) ! Septra  3) ! * Quinolones  4) ! Doxycycline   Past Medical  History:  Diverticulitis  Partial Colectomy  Chronic Anxiety Syndrome  Irritable Bowel Syndrome  G E R D  Chronic cough  - Better off ACE 11/2008  - PFT's nl 01/21/2008   Family History:  neg resp dz/ atopy   Social History:  Patient states former smoker. Quit 1995   Review of Systems    Constitutional:   No  weight loss, night sweats,  Fevers, chills, fatigue, or  lassitude.  HEENT:   No headaches,  Difficulty swallowing,  Tooth/dental problems, or  Sore throat,                No sneezing, itching, ear ache, nasal congestion, post nasal drip,   CV:  No chest pain,  Orthopnea, PND, swelling in lower extremities, anasarca, dizziness, palpitations, syncope.   GI  No heartburn, indigestion, abdominal pain, nausea, vomiting, diarrhea, change in bowel habits, loss of appetite, bloody stools.   Resp: No shortness of breath with exertion or at rest.  N   No coughing up of blood.  No change in color of mucus.  No wheezing.  No chest wall deformity  Skin: no rash or lesions.  GU: no dysuria, change in color of urine, no urgency or frequency.  No flank pain, no hematuria   MS:  No joint pain or swelling.  No decreased range of motion.  No back pain.  Psych:  No change in  mood or affect. No depression or anxiety.  No memory loss.      Objective:   Physical Exam    Depressed amb wf nad  Nasal tone to voice, congested cough    Wt  136 02/06/2012 >>140 02/20/2012   HEENT: nl dentition, turbinates, and orophanx. Nl external ear canals without cough reflex   NECK :  without JVD/Nodes/TM/ nl carotid upstrokes bilaterally   LUNGS: no acc muscle use, clear to A and P bilaterally without cough on insp or exp maneuvers   CV:  RRR  no s3 or murmur or increase in P2, no edema   ABD:  soft and nontender with nl excursion in the supine position. No bruits or organomegaly, bowel sounds nl  MS:  warm without deformities, calf tenderness, cyanosis or clubbing  SKIN: warm and dry  without lesions    NEURO:  alert, approp, no deficits   cxr 01/28/12 Copd/ no acute changes   Assessment:          Plan:

## 2012-02-20 NOTE — Patient Instructions (Signed)
Follow med calendar closely and bring to each visit.  follow up Dr. Wert  In 2 months and As needed    

## 2012-02-22 ENCOUNTER — Ambulatory Visit (INDEPENDENT_AMBULATORY_CARE_PROVIDER_SITE_OTHER): Payer: Medicare Other | Admitting: General Surgery

## 2012-02-22 ENCOUNTER — Encounter (INDEPENDENT_AMBULATORY_CARE_PROVIDER_SITE_OTHER): Payer: Self-pay | Admitting: General Surgery

## 2012-02-22 VITALS — BP 120/73 | Temp 98.2°F | Ht 60.0 in | Wt 138.4 lb

## 2012-02-22 DIAGNOSIS — C50919 Malignant neoplasm of unspecified site of unspecified female breast: Secondary | ICD-10-CM | POA: Diagnosis not present

## 2012-02-22 NOTE — Progress Notes (Signed)
Addended by: Salli Quarry on: 02/22/2012 04:57 PM   Modules accepted: Orders

## 2012-02-22 NOTE — Patient Instructions (Signed)
Continue regular self exams  

## 2012-02-26 ENCOUNTER — Encounter (INDEPENDENT_AMBULATORY_CARE_PROVIDER_SITE_OTHER): Payer: Self-pay | Admitting: General Surgery

## 2012-02-26 NOTE — Progress Notes (Signed)
Subjective:     Patient ID: Gloria [name], female   DOB: [birth year], 78 y.o.   MRN: [id number]  HPI The patient is a 78 year old white female who is about a year and a half out from a left mastectomy and axillary lymph node dissection for a T2 N1 a left breast cancer. She has done well since her last visit. She has no complaints today. She has some very mild discomfort occasionally on her left chest wall and axillary area. This is stable. No other medical problems since her last visit  Review of Systems  Constitutional: Negative.   HENT: Negative.   Eyes: Negative.   Respiratory: Negative.   Cardiovascular: Negative.   Gastrointestinal: Negative.   Genitourinary: Negative.   Musculoskeletal: Negative.   Skin: Negative.   Neurological: Negative.   Hematological: Negative.   Psychiatric/Behavioral: Negative.        Objective:   Physical Exam  Constitutional: She is oriented to person, place, and time. She appears well-developed and well-nourished.  HENT:  Head: Normocephalic and atraumatic.  Eyes: Conjunctivae and EOM are normal. Pupils are equal, round, and reactive to light.  Neck: Normal range of motion. Neck supple.  Cardiovascular: Normal rate, regular rhythm and normal heart sounds.   Pulmonary/Chest: Effort normal and breath sounds normal.       There is no palpable mass of the left chest wall. There is no palpable mass in the right breast. She has some round fullness in the left axilla which is a residual seroma by ultrasound. This has been stable. No palpable axillary adenopathy on the right.  Abdominal: Soft. Bowel sounds are normal. She exhibits no mass. There is no tenderness.  Musculoskeletal: Normal range of motion.  Lymphadenopathy:    She has no cervical adenopathy.  Neurological: She is alert and oriented to person, place, and time.  Skin: Skin is warm and dry.  Psychiatric: She has a normal mood and affect. Her behavior is normal.       Assessment:     One  half years status post left mastectomy and axillary lymph node dissection.    Plan:     Overall she is doing well. She will continue to do regular self exams. We will plan to see her back in about 6 months.

## 2012-02-28 ENCOUNTER — Telehealth: Payer: Self-pay | Admitting: Adult Health

## 2012-02-28 NOTE — Telephone Encounter (Signed)
LMOM TCB x1 for pt - need to verify her losartan-hctz dosage (this was erroneously removed from her med list).  Pt's 4.23.13 med calendar has the 50/25mg  daily on it, but med list reports the 5-/12.5mg .  Need to clarify this.

## 2012-02-29 NOTE — Telephone Encounter (Signed)
[id number]. Mathieu Schloemer, CMA  

## 2012-03-01 NOTE — Telephone Encounter (Signed)
Returning call can be reached at [phone].Gloria [name]

## 2012-03-01 NOTE — Telephone Encounter (Signed)
Called spoke with patient who looked at her medication bottle for the losartan/hctz and verified that she is taking 50/12.5mg .  Will have TP correct the dose on pt's med calendar and mail her a new copy on Monday when TP returns.  Pt okay with this.  Med list updated again.

## 2012-03-04 NOTE — Telephone Encounter (Signed)
Pt stated she is having problems w/ her drugs-w/ her breathing medicine.  Pt asked to be reached at [phone].  Antionette Fairy

## 2012-03-04 NOTE — Telephone Encounter (Signed)
Pt is aware of MW recs and will see TP on Wed., 5/8 re:  Leg cramps, sob and medications.

## 2012-03-04 NOTE — Telephone Encounter (Signed)
Pt states her breathing has declined in the past week and she is having more chest tightness. She also has concerns of leg cramping on the Brovana. She prefers that we send this msg to MW for any recs and is aware she will most likely receive no response on this today because MW is not in the office this afternoon. She was instructed to seek emergency help if her breathing gets worse. Pls advise. Allergies  Allergen Reactions  . Contrast Media (Iodinated Diagnostic Agents) Anaphylaxis  . Adhesive (Tape) Other (See Comments)    blisters  . Cephalexin Swelling  . Ciprofloxacin Swelling  . Demerol Nausea And Vomiting  . Penicillins Swelling    REACTION: allergic to penicillin  . Sulfamethoxazole W-Trimethoprim Itching and Swelling    REACTION: swelling/hives  . Quinolones Itching and Rash    REACTION: itching, rash Pt. Reports no problems with levaquin

## 2012-03-04 NOTE — Telephone Encounter (Signed)
Can use half dosing on the brovana but should see me or Tammy this week if not doing well on present rx and go to er if condition worsens in meantime

## 2012-03-06 ENCOUNTER — Encounter: Payer: Self-pay | Admitting: Internal Medicine

## 2012-03-06 ENCOUNTER — Ambulatory Visit (INDEPENDENT_AMBULATORY_CARE_PROVIDER_SITE_OTHER): Payer: Medicare Other | Admitting: Adult Health

## 2012-03-06 ENCOUNTER — Ambulatory Visit (INDEPENDENT_AMBULATORY_CARE_PROVIDER_SITE_OTHER): Payer: Medicare Other | Admitting: Internal Medicine

## 2012-03-06 ENCOUNTER — Ambulatory Visit (INDEPENDENT_AMBULATORY_CARE_PROVIDER_SITE_OTHER)
Admission: RE | Admit: 2012-03-06 | Discharge: 2012-03-06 | Disposition: A | Discharge status: Home / Self Care | Payer: Medicare Other | Source: Ambulatory Visit | Attending: Internal Medicine | Admitting: Internal Medicine

## 2012-03-06 VITALS — BP 130/80 | HR 70 | Temp 97.7°F | Ht 60.0 in | Wt 138.0 lb

## 2012-03-06 DIAGNOSIS — R05 Cough: Secondary | ICD-10-CM

## 2012-03-06 DIAGNOSIS — R0609 Other forms of dyspnea: Secondary | ICD-10-CM | POA: Diagnosis not present

## 2012-03-06 DIAGNOSIS — R059 Cough, unspecified: Secondary | ICD-10-CM

## 2012-03-06 DIAGNOSIS — J45909 Unspecified asthma, uncomplicated: Secondary | ICD-10-CM

## 2012-03-06 DIAGNOSIS — R0600 Dyspnea, unspecified: Secondary | ICD-10-CM

## 2012-03-06 DIAGNOSIS — R0989 Other specified symptoms and signs involving the circulatory and respiratory systems: Secondary | ICD-10-CM | POA: Diagnosis not present

## 2012-03-06 DIAGNOSIS — J3489 Other specified disorders of nose and nasal sinuses: Secondary | ICD-10-CM | POA: Diagnosis not present

## 2012-03-06 DIAGNOSIS — R042 Hemoptysis: Secondary | ICD-10-CM | POA: Diagnosis not present

## 2012-03-06 NOTE — Assessment & Plan Note (Signed)
-   02/06/2012  Walked RA x 3 laps @ 185 ft each stopped due to  End of study, no desat    - 03/06/2012  Walked RA x 3 laps @ 185 ft each stopped due to  End of study, no distress or desat  Again unable to reproduce the complaint of sob across the room every time she walks.  Symptoms are markedly disproportionate to objective findings and not clear this is a lung problem but pt does appear to have difficult airway management issues. DDX of  difficult airways managment all start with A and  include Adherence, Ace Inhibitors, Acid Reflux, Active Sinus Disease, Alpha 1 Antitripsin deficiency, Anxiety masquerading as Airways dz,  ABPA,  allergy(esp in young), Aspiration (esp in elderly), Adverse effects of DPI,  Active smokers, plus two Bs  = Bronchiectasis and Beta blocker use..and one C= CHF  ? Acid reflux > reviewed max rx  ? Anxiety, usually a dx of exclusion but based on affect and evasive responses to questions feel this is the most likely mechanism

## 2012-03-06 NOTE — Patient Instructions (Signed)
Please see patient coordinator before you leave today  to schedule sinus CT  Please remember to go to the  x-ray department downstairs for your tests - we will call you with the results when they are available.     See Tammy NP w/in 2 weeks with all your medications, even over the counter meds, separated in two separate bags, the ones you take no matter what vs the ones you stop once you feel better and take only as needed when you feel you need them.   Tammy  will generate for you a new user friendly medication calendar that will put [place] all on the same page re: your medication use.     Without this process, it simply isn't possible to assure that we are providing  your outpatient care  with  the attention to detail we feel you deserve.   If we cannot assure that you're getting that kind of care,  then we cannot manage your problem effectively from this clinic.  Once you have seen Tammy and we are sure that we're all on the same page with your medication use she will arrange follow up with me.

## 2012-03-06 NOTE — Assessment & Plan Note (Signed)
Seen by wert

## 2012-03-06 NOTE — Progress Notes (Signed)
Pt requested to see MW today rather than Tammy P for hemoptysis x1 episode this morning.  Appt changed to Dr Sherene Sires for 1145 this morning.  Pt aware she may have to wait to be seen and was okay with this.  Appt "unarrived" from TP's schedule.

## 2012-03-06 NOTE — Assessment & Plan Note (Addendum)
Sinus CT 03/06/2012 > nl  The most common causes of chronic cough in immunocompetent adults include the following: upper airway cough syndrome (UACS), previously referred to as postnasal drip syndrome (PNDS), which is caused by variety of rhinosinus conditions; (2) asthma; (3) GERD; (4) chronic bronchitis from cigarette smoking or other inhaled environmental irritants; (5) nonasthmatic eosinophilic bronchitis; and (6) bronchiectasis.   These conditions, singly or in combination, have accounted for up to 94% of the causes of chronic cough in prospective studies.   Other conditions have constituted no >6% of the causes in prospective studies These have included bronchogenic carcinoma, chronic interstitial pneumonia, sarcoidosis, left ventricular failure, ACEI-induced cough, and aspiration from a condition associated with pharyngeal dysfunction.   .Chronic cough is often simultaneously caused by more than one condition. A single cause has been found from 38 to 82% of the time, multiple causes from 18 to 62%. Multiply caused cough has been the result of three diseases up to 42% of the time.     This is most c/w  Classic Upper airway cough syndrome, so named because it's frequently impossible to sort out how much is  CR/sinusitis with freq throat clearing (which can be related to primary GERD)   vs  causing  secondary (" extra esophageal")  GERD from wide swings in gastric pressure that occur with throat clearing, often  promoting self use of mint and menthol lozenges that reduce the lower esophageal sphincter tone and exacerbate the problem further in a cyclical fashion.   These are the same pts (now being labeled as having "irritable larynx syndrome" by some cough centers) who not infrequently have a history of having failed to tolerate ace inhibitors,  dry powder inhalers or biphosphonates or report having atypical reflux symptoms that don't respond to standard doses of PPI , and are easily confused as  having aecopd or asthma flares by even experienced allergists/ pulmonologists.   For now max gerd rx and then regroup in 2 weeks with meds using a trust but verify approach.

## 2012-03-06 NOTE — Progress Notes (Signed)
Subjective:     Patient ID: Gloria [name], female   DOB: 1928-02-10  MRN: [id number]  HPI  84yowf quit smoking 1995 with refractory cough attributed to her asthmatic bronchitis and quite a bit better after changing over to nebulized inhalers in the form of brovana and budesonide. She did develop significant leg cramps which she attributed to [facility] and seemed better when she stopped it.   December 23, 2008 reports indolent onset of recurrent cough x sev months recurred if stop zyrtec and mucinex and worried about generic budesonide not be strong enough to control her coughing and wheezing.    Rec avoid ace, use neb up to twice daily with one half dose of brovana and full dose budesonide.      02/06/2012 f/u ov/Gloria [name] cc cough x 2 weeks some better p levaquin completed on 4/7 with traces of brb and ear congestion and new sob on bud/brovana bid, also more trouble with hearing and persistently slt discolored mucus in am, gen post chest discomfort with coughing. No better with otc - c/o cough and congestion requiring zyrtec and mucinex at baseline maybe every other day x last 3 years rec Take mucinex dm  1200mg  every 12 hour and supplement if needed with  tramadol 50 mg up to 1-2 every 4 hours to suppress the urge to cough  Once you have eliminated the cough for 3 straight days try reducing the tramadol first,  then the delsym as tolerated.   Prednisone 10 mg take  4 each am x 2 days,   2 each am x 2 days,  1 each am x2days and stop  Prilosec 20 mg Take 30- 60 min before your first and last meals of the day and Pepcid 20 mg at bedtime Stop aspirin when you see any bleeding at all   03/06/2012 f/u ov/Gloria [name] cc Pt c/o increased SOB x 3 wks. She states had prod cough this am (white)with small amount of BRB. Stopped neb meds 3 days prior to OV > no change in symptoms. Sob with anything more than room to room walking, no variability, no better with saba.  Can't tolerate even half dose laba without mucle spasms  generalized and no perceived benefit.   Sleeping ok without nocturnal  or early am exacerbation  of respiratory  c/o's or need for noct saba. Also denies any obvious fluctuation of symptoms with weather or environmental changes or other aggravating or alleviating factors except as outlined above   ROS  At present neg for  any significant sore throat, dysphagia, dental problems, itching, sneezing,      fever, chills, sweats, unintended wt loss, pleuritic or exertional cp,  palpitations, orthopnea pnd or leg swelling.  Also denies presyncope, palpitations, heartburn, abdominal pain, anorexia, nausea, vomiting, diarrhea  or change in bowel or urinary habits, change in stools or urine, dysuria,hematuria,  rash, arthralgias, visual complaints, headache, numbness weakness or ataxia or problems with walking or coordination. No noted change in mood/affect or memory.      Allergies  1) ! Penicillin G Pot in Dextrose (Penicillin G Potassium in D5w)  2) ! Septra  3) ! * Quinolones  4) ! Doxycycline   Past Medical History:  Diverticulitis  Partial Colectomy  Chronic Anxiety Syndrome  Irritable Bowel Syndrome  G E R D  Chronic cough  - Better off ACE 11/2008  - PFT's nl 01/21/2008   Family History:  neg resp dz/ atopy   Social History:  Patient states former smoker.  Quit 1995        Objective:   Physical Exam    Depressed amb wf  Who  failed to answer a single question asked in a straightforward manner, tending to go off on tangents or answer questions with ambiguous medical terms or diagnoses and seemed aggravated  when asked the same question more than once for clarification.   No longer asal tone to voice, mincongested cough    Wt  136 02/06/2012 >  138 03/06/2012   HEENT: nl dentition, turbinates, and orophanx. Nl external ear canals without cough reflex   NECK :  without JVD/Nodes/TM/ nl carotid upstrokes bilaterally   LUNGS: no acc muscle use, clear to A and P bilaterally  without cough on insp or exp maneuvers   CV:  RRR  no s3 or murmur or increase in P2, no edema   ABD:  soft and nontender with nl excursion in the supine position. No bruits or organomegaly, bowel sounds nl  MS:  warm without deformities, calf tenderness, cyanosis or clubbing     CXR  03/06/2012 : Probable COPD. No active lung disease.     Assessment:          Plan:

## 2012-03-06 NOTE — Assessment & Plan Note (Addendum)
At this point not really clear she has asthma at all, will do the reverse of a therapeutic trial and ask her to stop all maint rx and see if perceived need for saba changes or night time symptoms flare or disturb sleep, which would be indicative of true asthma vs anxiety related symptoms.

## 2012-03-06 NOTE — Progress Notes (Signed)
  Subjective:    Patient ID: Gloria [name], female    DOB: 1928/10/20, 78 y.o.   MRN: [id number]  HPI Not seen , seen by Dr. Sherene Sires     Review of Systems     Objective:   Physical Exam        Assessment & Plan:

## 2012-03-08 ENCOUNTER — Telehealth: Payer: Self-pay | Admitting: Internal Medicine

## 2012-03-08 NOTE — Telephone Encounter (Signed)
Pt called back and i have spoken to her about her ct and cxr results per MW recs.  Pt voiced her understanding of these results.

## 2012-03-08 NOTE — Telephone Encounter (Signed)
[id number] x 1 

## 2012-03-11 ENCOUNTER — Ambulatory Visit (INDEPENDENT_AMBULATORY_CARE_PROVIDER_SITE_OTHER): Payer: Medicare Other | Admitting: Adult Health

## 2012-03-11 ENCOUNTER — Encounter: Payer: Self-pay | Admitting: Adult Health

## 2012-03-11 VITALS — BP 112/72 | HR 80 | Temp 97.2°F | Ht 60.0 in | Wt 140.2 lb

## 2012-03-11 DIAGNOSIS — J45909 Unspecified asthma, uncomplicated: Secondary | ICD-10-CM | POA: Diagnosis not present

## 2012-03-11 NOTE — Patient Instructions (Addendum)
Follow med calendar closely and bring to each visit.  May leave off Nebs for now, if flare call our office back.  Follow up Dr. Sherene Sires  As needed   You can follow up back with your family doctor , if you need [place] for your asthma call back

## 2012-03-11 NOTE — Progress Notes (Signed)
Subjective:     Patient ID: Gloria [name], female   DOB: 27-Dec-1927  MRN: [id number]  HPI  84yowf quit smoking 1995 with refractory cough attributed to her asthmatic bronchitis and quite a bit better after changing over to nebulized inhalers in the form of brovana and budesonide. She did develop significant leg cramps which she attributed to [facility] and seemed better when she stopped it.   December 23, 2008 reports indolent onset of recurrent cough x sev months recurred if stop zyrtec and mucinex and worried about generic budesonide not be strong enough to control her coughing and wheezing.    Rec avoid ace, use neb up to twice daily with one half dose of brovana and full dose budesonide.      02/06/2012 f/u ov/Wert cc cough x 2 weeks some better p levaquin completed on 4/7 with traces of brb and ear congestion and new sob on bud/brovana bid, also more trouble with hearing and persistently slt discolored mucus in am, gen post chest discomfort with coughing. No better with otc - c/o cough and congestion requiring zyrtec and mucinex at baseline maybe every other day x last 3 years rec Take mucinex dm  1200mg  every 12 hour and supplement if needed with  tramadol 50 mg up to 1-2 every 4 hours to suppress the urge to cough  Once you have eliminated the cough for 3 straight days try reducing the tramadol first,  then the delsym as tolerated.   Prednisone 10 mg take  4 each am x 2 days,   2 each am x 2 days,  1 each am x2days and stop  Prilosec 20 mg Take 30- 60 min before your first and last meals of the day and Pepcid 20 mg at bedtime Stop aspirin when you see any bleeding at all  02/20/12 Med calendar   03/06/2012 f/u ov/Wert cc Pt c/o increased SOB x 3 wks. She states had prod cough this am (white)with small amount of BRB. Stopped neb meds 3 days prior to OV > no change in symptoms. Sob with anything more than room to room walking, no variability, no better with saba.  Can't tolerate even half dose laba  without mucle spasms generalized and no perceived benefit. >CT sinus neg , CXR -chronic changes   03/11/2012 Follow up and med review  Stopped Brovana/Budesonide due to multiple side effects " will not take unless an emergency".  She is feeling better , with decreased cough and dyspnea.  CT sinus and cxr showed no acute process last office visit.  We reviewed all her meds and organized them into a med calendar w/ pt education         Allergies  1) ! Penicillin G Pot in Dextrose (Penicillin G Potassium in D5w)  2) ! Septra  3) ! * Quinolones  4) ! Doxycycline   Past Medical History:  Diverticulitis  Partial Colectomy  Chronic Anxiety Syndrome  Irritable Bowel Syndrome  G E R D  Chronic cough  - Better off ACE 11/2008  - PFT's nl 01/21/2008   Family History:  neg resp dz/ atopy   Social History:  Patient states former smoker. Quit 1995  ROS:  Constitutional:   No  weight loss, night sweats,  Fevers, chills, fatigue, or  lassitude.  HEENT:   No headaches,  Difficulty swallowing,  Tooth/dental problems, or  Sore throat,                No sneezing, itching, ear  ache, nasal congestion, post nasal drip,   CV:  No chest pain,  Orthopnea, PND, swelling in lower extremities, anasarca, dizziness, palpitations, syncope.   GI  No heartburn, indigestion, abdominal pain, nausea, vomiting, diarrhea, change in bowel habits, loss of appetite, bloody stools.   Resp: No shortness of breath with exertion or at rest.  No excess mucus, no productive cough,  No non-productive cough,  No coughing up of blood.  No change in color of mucus.  No wheezing.  No chest wall deformity  Skin: no rash or lesions.  GU: no dysuria, change in color of urine, no urgency or frequency.  No flank pain, no hematuria   MS:  No joint pain or swelling.  No decreased range of motion.  No back pain.  Psych:  No change in mood or affect. No depression or anxiety.  No memory loss.           Objective:    Physical Exam        Wt  136 02/06/2012 >  138 03/06/2012 > 140  03/11/2012   HEENT: nl dentition, turbinates, and orophanx. Nl external ear canals without cough reflex   NECK :  without JVD/Nodes/TM/ nl carotid upstrokes bilaterally   LUNGS: no acc muscle use, clear to A and P bilaterally without cough on insp or exp maneuvers   CV:  RRR  no s3 or murmur or increase in P2, no edema   ABD:  soft and nontender with nl excursion in the supine position. No bruits or organomegaly, bowel sounds nl  MS:  warm without deformities, calf tenderness, cyanosis or clubbing     CXR  03/06/2012 : Probable COPD. No active lung disease. CT sinus neg    Assessment:          Plan:

## 2012-03-11 NOTE — Assessment & Plan Note (Signed)
No flare off nebs.  Med calendar done  follow up on As needed  Basis

## 2012-03-21 NOTE — Progress Notes (Signed)
Addended by: Boone Master E on: 03/21/2012 04:06 PM   Modules accepted: Orders

## 2012-03-29 DIAGNOSIS — R05 Cough: Secondary | ICD-10-CM | POA: Diagnosis not present

## 2012-03-29 DIAGNOSIS — H6120 Impacted cerumen, unspecified ear: Secondary | ICD-10-CM | POA: Diagnosis not present

## 2012-03-29 DIAGNOSIS — R252 Cramp and spasm: Secondary | ICD-10-CM | POA: Diagnosis not present

## 2012-03-29 DIAGNOSIS — I10 Essential (primary) hypertension: Secondary | ICD-10-CM | POA: Diagnosis not present

## 2012-04-01 ENCOUNTER — Telehealth: Payer: Self-pay | Admitting: Internal Medicine

## 2012-04-01 NOTE — Telephone Encounter (Signed)
Pt c/o sore throat, rt ear pain and cough-prod-yellow, pt saw dr Pete Glatter Friday and was given advair and told to call back today if no better, but pt called here to see mw instead told pt mw out of the office and since he started treatment and wanted her to call back she should contact dr stoneking's office for further recs. If his office feels she should see [place] they can give [place] a call, pt fine with this.

## 2012-04-23 ENCOUNTER — Encounter: Payer: Self-pay | Admitting: Internal Medicine

## 2012-04-23 ENCOUNTER — Ambulatory Visit (INDEPENDENT_AMBULATORY_CARE_PROVIDER_SITE_OTHER): Payer: Medicare Other | Admitting: Internal Medicine

## 2012-04-23 VITALS — BP 118/76 | HR 72 | Temp 98.2°F | Ht 60.0 in | Wt 138.4 lb

## 2012-04-23 DIAGNOSIS — J45909 Unspecified asthma, uncomplicated: Secondary | ICD-10-CM | POA: Diagnosis not present

## 2012-04-23 NOTE — Progress Notes (Signed)
Subjective:     Patient ID: Gloria [name], female   DOB: November 14, 1927  MRN: [id number]  HPI  64 yowf quit smoking 1995 with refractory cough attributed to her asthmatic bronchitis and quite a bit better after changing over to nebulized inhalers in the form of brovana and budesonide. She did develop significant leg cramps which she attributed to [facility] and seemed better when she stopped it.   December 23, 2008 reports indolent onset of recurrent cough x sev months recurred if stop zyrtec and mucinex and worried about generic budesonide not be strong enough to control her coughing and wheezing.    Rec avoid ace, use neb up to twice daily with one half dose of brovana and full dose budesonide.      02/06/2012 f/u ov/Sanchez Hemmer cc cough x 2 weeks some better p levaquin completed on 4/7 with traces of brb and ear congestion and new sob on bud/brovana bid, also more trouble with hearing and persistently slt discolored mucus in am, gen post chest discomfort with coughing. No better with otc - c/o cough and congestion requiring zyrtec and mucinex at baseline maybe every other day x last 3 years rec Take mucinex dm  1200mg  every 12 hour and supplement if needed with  tramadol 50 mg up to 1-2 every 4 hours to suppress the urge to cough  Once you have eliminated the cough for 3 straight days try reducing the tramadol first,  then the delsym as tolerated.   Prednisone 10 mg take  4 each am x 2 days,   2 each am x 2 days,  1 each am x2days and stop  Prilosec 20 mg Take 30- 60 min before your first and last meals of the day and Pepcid 20 mg at bedtime Stop aspirin when you see any bleeding at all     03/06/2012 f/u ov/Jamauri Kruzel cc Pt c/o increased SOB x 3 wks. She states had prod cough this am (white)with small amount of BRB. Stopped neb meds 3 days prior to OV > no change in symptoms. Sob with anything more than room to room walking, no variability, no better with saba.  Can't tolerate even half dose laba without mucle  spasms generalized and no perceived benefit. >CT sinus neg , CXR -chronic changes   03/11/2012 Follow up and med review  Stopped Brovana/Budesonide due to multiple side effects " will not take unless an emergency".  She is feeling better , with decreased cough and dyspnea.  CT sinus and cxr showed no acute process last office visit.  We reviewed all her meds and organized them into a med calendar w/ pt education  rec Follow med calendar closely and bring to each visit.  May leave off Nebs for now, if flare call our office back  04/23/2012 f/u ov/Thatcher Doberstein did produce her med calendar until the very end of the visit, stating she left "her list" at home and didn't realize she had it in her purse but doesn't correlate anyway with all the meds she's taking (pepcid and potassium not on list but in her bag of maint meds) Did not show Dr Pete Glatter the med calendar we made for her and received advair when she had budesonide/formoterol on the calendar as a maintenance.  Maint c/o is sorethroat for which already prescribed zmax.   No unusual cough, purulent sputum or sinus/hb symptoms on present rx.     Sleeping ok without nocturnal  or early am exacerbation  of respiratory  c/o's or need  for noct saba. Also denies any obvious fluctuation of symptoms with weather or environmental changes or other aggravating or alleviating factors except as outlined above        Allergies  1) ! Penicillin G Pot in Dextrose (Penicillin G Potassium in D5w)  2) ! Septra  3) ! * Quinolones  4) ! Doxycycline   Past Medical History:  Diverticulitis  Partial Colectomy  Chronic Anxiety Syndrome  Irritable Bowel Syndrome  G E R [facility]  Chronic cough  - Better off ACE 11/2008  - PFT's nl 01/21/2008   Family History:  neg resp dz/ atopy   Social History:  Patient states former smoker. Quit 1995           Objective:   Physical Exam  amb elderly wf who failed to answer a single question asked in a straightforward  manner, tending to go off on tangents or answer questions with ambiguous medical terms or diagnoses and seemed aggravated  when asked the same question more than once for clarification.    Wt  136 02/06/2012 >  138 03/06/2012 > 140  03/11/2012 > 04/23/2012  138  HEENT: nl dentition, turbinates, and orophanx. Nl external ear canals without cough reflex   NECK :  without JVD/Nodes/TM/ nl carotid upstrokes bilaterally   LUNGS: no acc muscle use, clear to A and P bilaterally without cough on insp or exp maneuvers   CV:  RRR  no s3 or murmur or increase in P2, no edema   ABD:  soft and nontender with nl excursion in the supine position. No bruits or organomegaly, bowel sounds nl  MS:  warm without deformities, calf tenderness, cyanosis or clubbing     CXR  03/06/2012 : Probable COPD. No active lung disease. CT sinus completely nl x for post op changes   Assessment:          Plan:

## 2012-04-23 NOTE — Patient Instructions (Addendum)
See calendar for specific medication instructions and bring it back for each and every office visit for every healthcare provider you see.  Without it,  you may not receive the best quality medical care that we feel you deserve.  You will note that the calendar groups together  your maintenance  medications that are timed at particular times of the day.  Think of this as your checklist for what your doctor has instructed you to do until your next evaluation to see what benefit  there is  to staying on a consistent group of medications intended to keep you well.  The other group at the bottom is entirely up to you to use as you see fit  for specific symptoms that may arise between visits that require you to treat them on an as needed basis.  Think of this as your action plan or "what if" list.   Separating the top medications from the bottom group is fundamental to providing you adequate care going forward.    If your sore throat or lump in throat bothers you please let Dr Pete Glatter refer you to an ENT doctor of his choice.   No need for further pulmonary follow up - Dr Pete Glatter can refer you back to [place] if there is a problem he wants [place] to address

## 2012-04-24 NOTE — Assessment & Plan Note (Signed)
-   PFT's 01/02/2008  FEV1 1.73 (110%) with ratio 71 and DLCO 77% -med calendar 02/20/2012 , 03/11/2012   The asthma component of her problem has been elminated on present rx.  No further pulmonary f/u needed  I had an extended discussion with the patient today lasting 15 to 20 minutes of a 25 minute visit on the following issues:  She fails to grasp the importance of meaningful and accurate medication reconciliation. In this setting the fewer cooks in the kitchen the better and we'll just see her prn at Dr Laverle Hobby request.

## 2012-04-29 DIAGNOSIS — H6980 Other specified disorders of Eustachian tube, unspecified ear: Secondary | ICD-10-CM | POA: Diagnosis not present

## 2012-04-29 DIAGNOSIS — J322 Chronic ethmoidal sinusitis: Secondary | ICD-10-CM | POA: Diagnosis not present

## 2012-05-03 DIAGNOSIS — J019 Acute sinusitis, unspecified: Secondary | ICD-10-CM | POA: Diagnosis not present

## 2012-05-03 DIAGNOSIS — J029 Acute pharyngitis, unspecified: Secondary | ICD-10-CM | POA: Diagnosis not present

## 2012-05-14 ENCOUNTER — Other Ambulatory Visit (HOSPITAL_BASED_OUTPATIENT_CLINIC_OR_DEPARTMENT_OTHER): Payer: Medicare Other

## 2012-05-14 ENCOUNTER — Telehealth: Payer: Self-pay | Admitting: *Deleted

## 2012-05-14 ENCOUNTER — Ambulatory Visit (HOSPITAL_BASED_OUTPATIENT_CLINIC_OR_DEPARTMENT_OTHER): Payer: Medicare Other | Admitting: Oncology

## 2012-05-14 VITALS — BP 130/83 | HR 73 | Temp 97.7°F | Ht 60.0 in | Wt 138.7 lb

## 2012-05-14 DIAGNOSIS — C50919 Malignant neoplasm of unspecified site of unspecified female breast: Secondary | ICD-10-CM

## 2012-05-14 DIAGNOSIS — M810 Age-related osteoporosis without current pathological fracture: Secondary | ICD-10-CM | POA: Diagnosis not present

## 2012-05-14 DIAGNOSIS — E559 Vitamin D deficiency, unspecified: Secondary | ICD-10-CM | POA: Diagnosis not present

## 2012-05-14 DIAGNOSIS — C50119 Malignant neoplasm of central portion of unspecified female breast: Secondary | ICD-10-CM

## 2012-05-14 LAB — CBC WITH DIFFERENTIAL/PLATELET
Basophils Absolute: 0 10*3/uL (ref 0.0–0.1)
HCT: 37.9 % (ref 34.8–46.6)
HGB: 12.9 g/dL (ref 11.6–15.9)
LYMPH%: 19.3 % (ref 14.0–49.7)
MCH: 31.7 pg (ref 25.1–34.0)
MONO#: 0.3 10*3/uL (ref 0.1–0.9)
NEUT%: 60.7 % (ref 38.4–76.8)
Platelets: 126 10*3/uL — ABNORMAL LOW (ref 145–400)
WBC: 3.6 10*3/uL — ABNORMAL LOW (ref 3.9–10.3)
lymph#: 0.7 10*3/uL — ABNORMAL LOW (ref 0.9–3.3)

## 2012-05-14 LAB — MORPHOLOGY: PLT EST: DECREASED

## 2012-05-14 LAB — CHCC SMEAR

## 2012-05-14 NOTE — Progress Notes (Signed)
Hematology and Oncology Follow Up Visit  Gloria [name] [id number] 07/29/1928 78 y.o. 05/14/2012 12:55 PM PCP dr Nicki Guadalaj[place]; hal stoneking; pj toth  Principle Diagnosis: .  History of adenocarcinoma with lobular features status post biopsy with trial of neoadjuvant Femara therapy, status post lumpectomy and sentinel lymph node evaluation on 09/13/2010 with residual T2, N1, breast cancer, status post radiation therapy to the breast, completed on 01/23/2011, currently on tamoxifen  Interim History:  There have been no intercurrent illness, hospitalizations or medication changes. She is here with her granddaughter today. She's been doing well. She does have fairly significant leg cramps. She has tried various: No pathic remedies with limited success. She denies any swelling. 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  Medications: I have reviewed the patient's current medications.  Allergies:  Allergies  Allergen Reactions  . Contrast Media (Iodinated Diagnostic Agents) Anaphylaxis  . Adhesive (Tape) Other (See Comments)    blisters  . Cephalexin Swelling  . Ciprofloxacin Swelling  . Demerol Nausea And Vomiting  . Penicillins Swelling    REACTION: allergic to penicillin  . Sulfamethoxazole W-Trimethoprim Itching and Swelling    REACTION: swelling/hives  . Quinolones Itching and Rash    REACTION: itching, rash Pt. Reports no problems with levaquin    Past Medical History, Surgical history, Social history, and Family History were reviewed and updated.  Review of Systems: Constitutional:  Negative for fever, chills, night sweats, anorexia, weight loss, c/o lt sided cw pain. Cardiovascular: no chest pain or dyspnea on exertion Respiratory: no cough, shortness of breath, or wheezing Neurological: no TIA or stroke symptoms Dermatological: negative ENT: negative Skin Gastrointestinal: no abdominal pain, change in bowel habits, or black or bloody stools Genito-Urinary: no dysuria, trouble voiding, or  hematuria Hematological and Lymphatic: negative Breast: negative for breast lumps Musculoskeletal: negative Remaining ROS negative.  Physical Exam: Blood pressure 130/83, pulse 73, temperature 97.7 F (36.5 C), height 5' (1.524 m), weight 138 lb 11.2 oz (62.914 kg). ECOG:  General appearance: alert, cooperative and appears stated age Head: Normocephalic, without obvious abnormality, atraumatic Neck: no adenopathy, no carotid bruit, no JVD, supple, symmetrical, trachea midline and thyroid not enlarged, symmetric, no tenderness/mass/nodules Lymph nodes: Cervical, supraclavicular, and axillary nodes normal. Cardiac : regular rate and rhythm, no murmurs or gallops Pulmonary:clear to auscultation bilaterally and normal percussion bilaterally Breasts: inspection negative, no nipple discharge or bleeding, no masses or nodularity palpable , status post left mastectomy without evidence of local recurrence Abdomen:soft, non-tender; bowel sounds normal; no masses,  no organomegaly Extremities negative Neuro: alert, oriented, normal speech, no focal findings or movement disorder noted  Lab Results: Lab Results  Component Value Date   WBC 3.6* 05/14/2012   HGB 12.9 05/14/2012   HCT 37.9 05/14/2012   MCV 93.5 05/14/2012   PLT 126* 05/14/2012     Chemistry      Component Value Date/Time   NA 144 10/27/2011 1059   K 3.7 10/27/2011 1059   CL 107 10/27/2011 1059   CO2 27 10/27/2011 1059   BUN 16 10/27/2011 1059   CREATININE 0.91 10/27/2011 1059      Component Value Date/Time   CALCIUM 8.8 10/27/2011 1059   ALKPHOS 49 10/27/2011 1059   AST 23 10/27/2011 1059   ALT 12 10/27/2011 1059   BILITOT 0.4 10/27/2011 1059      .pathology. Radiological Studies: chest X-ray n/a Mammogram Due 10/13 Bone density n/a  Impression and Plan: Locally advanced breast cancer , er+, on neoadjuvant letrozole s/p lumpectomy  for T2N1 breast cancer, now on tamoxifen. No evidence of disease. F/u with 6  months, with imaging, she has stable blood counts. I have scheduled her for a bone density test. She'll also have a mammogram in October. I will see her in 6 months time. I recommended that she try some Caltrate D. and some over-the-counter magnesium supplements. 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 She'll let us know if that does help and if the cramps to improve.   More than 50% of the visit was spent in patient-related counselling   Pierce Crane, MD 7/16/201312:55 PM

## 2012-05-14 NOTE — Patient Instructions (Signed)
Please caltrate d , 2 times per day. Take extra magnesium, as  Over the counter supplements Try pickle juice for leg cramps If nothing works, stop tamoxifen for 1 month.. If cramps improve let us know ,

## 2012-05-14 NOTE — Telephone Encounter (Signed)
Gave patient appointment for lab and md printed out calendar and gave to the patient made patient mammogram at the breast center

## 2012-05-15 LAB — COMPREHENSIVE METABOLIC PANEL
ALT: 10 U/L (ref 0–35)
AST: 19 U/L (ref 0–37)
Albumin: 3.8 g/dL (ref 3.5–5.2)
Calcium: 9.1 mg/dL (ref 8.4–10.5)
Chloride: 106 mEq/L (ref 96–112)
Potassium: 3.6 mEq/L (ref 3.5–5.3)
Total Protein: 5.7 g/dL — ABNORMAL LOW (ref 6.0–8.3)

## 2012-05-29 ENCOUNTER — Other Ambulatory Visit: Payer: Self-pay

## 2012-05-29 DIAGNOSIS — C50919 Malignant neoplasm of unspecified site of unspecified female breast: Secondary | ICD-10-CM

## 2012-05-29 MED ORDER — TAMOXIFEN CITRATE 20 MG PO TABS: 20.0000 mg | ORAL_TABLET | Freq: Every day | ORAL | Status: DC

## 2012-06-19 DIAGNOSIS — I10 Essential (primary) hypertension: Secondary | ICD-10-CM | POA: Diagnosis not present

## 2012-06-19 DIAGNOSIS — Z79899 Other long term (current) drug therapy: Secondary | ICD-10-CM | POA: Diagnosis not present

## 2012-06-19 DIAGNOSIS — M25569 Pain in unspecified knee: Secondary | ICD-10-CM | POA: Diagnosis not present

## 2012-06-19 DIAGNOSIS — M810 Age-related osteoporosis without current pathological fracture: Secondary | ICD-10-CM | POA: Diagnosis not present

## 2012-07-03 DIAGNOSIS — N760 Acute vaginitis: Secondary | ICD-10-CM | POA: Diagnosis not present

## 2012-07-04 DIAGNOSIS — S82009A Unspecified fracture of unspecified patella, initial encounter for closed fracture: Secondary | ICD-10-CM | POA: Diagnosis not present

## 2012-07-04 DIAGNOSIS — M1710 Unilateral primary osteoarthritis, unspecified knee: Secondary | ICD-10-CM | POA: Diagnosis not present

## 2012-07-18 DIAGNOSIS — S82009A Unspecified fracture of unspecified patella, initial encounter for closed fracture: Secondary | ICD-10-CM | POA: Diagnosis not present

## 2012-07-24 DIAGNOSIS — M810 Age-related osteoporosis without current pathological fracture: Secondary | ICD-10-CM | POA: Diagnosis not present

## 2012-07-25 DIAGNOSIS — S82009A Unspecified fracture of unspecified patella, initial encounter for closed fracture: Secondary | ICD-10-CM | POA: Diagnosis not present

## 2012-07-30 DIAGNOSIS — S82009A Unspecified fracture of unspecified patella, initial encounter for closed fracture: Secondary | ICD-10-CM | POA: Diagnosis not present

## 2012-08-06 DIAGNOSIS — S82009A Unspecified fracture of unspecified patella, initial encounter for closed fracture: Secondary | ICD-10-CM | POA: Diagnosis not present

## 2012-08-07 ENCOUNTER — Telehealth (INDEPENDENT_AMBULATORY_CARE_PROVIDER_SITE_OTHER): Payer: Self-pay

## 2012-08-07 NOTE — Telephone Encounter (Signed)
The patient called because she has a follow up for her breast on 11/5.  She now has a lump on her throat that is below her ear.  It has been a week and a half since she noticed it.  The skin is not inflamed.  She has no fever.  She states it is affecting her swallowing some but not her breathing.  She wants to be seen sooner than 11/5 so Dr Carolynne Edouard can assess it.  Please call her if you can move that up.  Home # [phone] or cell [phone]

## 2012-08-08 ENCOUNTER — Ambulatory Visit
Admission: RE | Admit: 2012-08-08 | Discharge: 2012-08-08 | Disposition: A | Discharge status: Home / Self Care | Payer: Medicare Other | Source: Ambulatory Visit | Attending: Oncology | Admitting: Oncology

## 2012-08-08 DIAGNOSIS — E559 Vitamin D deficiency, unspecified: Secondary | ICD-10-CM

## 2012-08-08 DIAGNOSIS — C50919 Malignant neoplasm of unspecified site of unspecified female breast: Secondary | ICD-10-CM

## 2012-08-08 DIAGNOSIS — Z1231 Encounter for screening mammogram for malignant neoplasm of breast: Secondary | ICD-10-CM | POA: Diagnosis not present

## 2012-08-09 ENCOUNTER — Encounter: Payer: Self-pay | Admitting: *Deleted

## 2012-08-09 DIAGNOSIS — E559 Vitamin D deficiency, unspecified: Secondary | ICD-10-CM

## 2012-08-09 DIAGNOSIS — C50919 Malignant neoplasm of unspecified site of unspecified female breast: Secondary | ICD-10-CM

## 2012-08-09 NOTE — Progress Notes (Unsigned)
Pt daughter reports that pt has swelling in the jaw and throat area. Pt was seen by her dentist and periodontist who recommends that she see Dr Donnie Coffin. Labs and appt were sent tp scheduling

## 2012-08-13 ENCOUNTER — Ambulatory Visit (HOSPITAL_BASED_OUTPATIENT_CLINIC_OR_DEPARTMENT_OTHER): Payer: Medicare Other | Admitting: Oncology

## 2012-08-13 ENCOUNTER — Other Ambulatory Visit (HOSPITAL_BASED_OUTPATIENT_CLINIC_OR_DEPARTMENT_OTHER): Payer: Medicare Other | Admitting: Lab

## 2012-08-13 VITALS — BP 121/80 | HR 74 | Temp 98.3°F | Resp 20 | Ht 60.0 in | Wt 135.5 lb

## 2012-08-13 DIAGNOSIS — Z170 Estrogen receptor positive status [ER+]: Secondary | ICD-10-CM

## 2012-08-13 DIAGNOSIS — C50919 Malignant neoplasm of unspecified site of unspecified female breast: Secondary | ICD-10-CM

## 2012-08-13 DIAGNOSIS — E559 Vitamin D deficiency, unspecified: Secondary | ICD-10-CM | POA: Diagnosis not present

## 2012-08-13 DIAGNOSIS — C50119 Malignant neoplasm of central portion of unspecified female breast: Secondary | ICD-10-CM

## 2012-08-13 LAB — CBC WITH DIFFERENTIAL/PLATELET
BASO%: 1.2 % (ref 0.0–2.0)
HCT: 37.4 % (ref 34.8–46.6)
LYMPH%: 18.6 % (ref 14.0–49.7)
MCH: 32.5 pg (ref 25.1–34.0)
MCHC: 34.6 g/dL (ref 31.5–36.0)
MCV: 94 fL (ref 79.5–101.0)
MONO#: 0.4 10*3/uL (ref 0.1–0.9)
NEUT%: 67.8 % (ref 38.4–76.8)
Platelets: 138 10*3/uL — ABNORMAL LOW (ref 145–400)

## 2012-08-13 LAB — COMPREHENSIVE METABOLIC PANEL (CC13)
ALT: 16 U/L (ref 0–55)
CO2: 26 mEq/L (ref 22–29)
Creatinine: 1 mg/dL (ref 0.6–1.1)
Total Bilirubin: 0.4 mg/dL (ref 0.20–1.20)

## 2012-08-13 LAB — LACTATE DEHYDROGENASE (CC13): LDH: 166 U/L (ref 125–220)

## 2012-08-13 NOTE — Progress Notes (Signed)
Hematology and Oncology Follow Up Visit  Gloria [name] [id number] 12/12/1927 78 y.o. 08/13/2012 4:46 PM PCP dr Nicki Guadalajara; hal stoneking; pj toth  Principle Diagnosis: .  History of adenocarcinoma with lobular features status post biopsy with trial of neoadjuvant Femara therapy, status post lumpectomy and sentinel lymph node evaluation on 09/13/2010 with residual T2, N1, breast cancer, status post radiation therapy to the breast, completed on 01/23/2011, currently on tamoxifen  Interim History:  There have been no intercurrent illness, hospitalizations or medication changes. She is here with her granddaughter today. She's been doing well. She does have fairly significant leg cramps. She has tried various: No pathic remedies with limited success. She denies any swelling. 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  Medications: I have reviewed the patient's current medications.  Allergies:  Allergies  Allergen Reactions  . Contrast Media (Iodinated Diagnostic Agents) Anaphylaxis  . Adhesive (Tape) Other (See Comments)    blisters  . Cephalexin Swelling  . Ciprofloxacin Swelling  . Demerol Nausea And Vomiting  . Penicillins Swelling    REACTION: allergic to penicillin  . Sulfamethoxazole W-Trimethoprim Itching and Swelling    REACTION: swelling/hives  . Quinolones Itching and Rash    REACTION: itching, rash Pt. Reports no problems with levaquin    Past Medical History, Surgical history, Social history, and Family History were reviewed and updated.  Review of Systems: Constitutional:  Negative for fever, chills, night sweats, anorexia, weight loss, c/o lt sided cw pain. Cardiovascular: no chest pain or dyspnea on exertion Respiratory: no cough, shortness of breath, or wheezing Neurological: no TIA or stroke symptoms Dermatological: negative ENT: negative Skin Gastrointestinal: no abdominal pain, change in bowel habits, or black or bloody stools Genito-Urinary: no dysuria, trouble voiding, or  hematuria Hematological and Lymphatic: negative Breast: negative for breast lumps Musculoskeletal: negative Remaining ROS negative.  Physical Exam: Blood pressure 121/80, pulse 74, temperature 98.3 F (36.8 C), temperature source Oral, resp. rate 20, height 5' (1.524 m), weight 135 lb 8 oz (61.462 kg). ECOG:  General appearance: alert, cooperative and appears stated age Head: Normocephalic, without obvious abnormality, atraumatic Neck: no adenopathy, no carotid bruit, no JVD, supple, symmetrical, trachea midline and thyroid not enlarged, symmetric, no tenderness/mass/nodules Lymph nodes: Cervical, supraclavicular, and axillary nodes normal. Cardiac : regular rate and rhythm, no murmurs or gallops Pulmonary:clear to auscultation bilaterally and normal percussion bilaterally Breasts: inspection negative, no nipple discharge or bleeding, no masses or nodularity palpable , status post left mastectomy without evidence of local recurrence Abdomen:soft, non-tender; bowel sounds normal; no masses,  no organomegaly Extremities negative Neuro: alert, oriented, normal speech, no focal findings or movement disorder noted  Lab Results: Lab Results  Component Value Date   WBC 4.6 08/13/2012   HGB 13.0 08/13/2012   HCT 37.4 08/13/2012   MCV 94.0 08/13/2012   PLT 138* 08/13/2012     Chemistry      Component Value Date/Time   NA 141 08/13/2012 1504   NA 143 05/14/2012 1139   K 3.5 08/13/2012 1504   K 3.6 05/14/2012 1139   CL 106 08/13/2012 1504   CL 106 05/14/2012 1139   CO2 26 08/13/2012 1504   CO2 30 05/14/2012 1139   BUN 19.0 08/13/2012 1504   BUN 17 05/14/2012 1139   CREATININE 1.0 08/13/2012 1504   CREATININE 0.82 05/14/2012 1139      Component Value Date/Time   CALCIUM 9.2 08/13/2012 1504   CALCIUM 9.1 05/14/2012 1139   ALKPHOS 50 08/13/2012 1504  ALKPHOS 46 05/14/2012 1139   AST 20 08/13/2012 1504   AST 19 05/14/2012 1139   ALT 16 08/13/2012 1504   ALT 10 05/14/2012 1139   BILITOT  0.40 08/13/2012 1504   BILITOT 0.4 05/14/2012 1139      .pathology. Radiological Studies: chest X-ray n/a Mammogram Due 10/13 Bone density n/a  Impression and Plan: Locally advanced breast cancer , er+, on neoadjuvant letrozole s/p lumpectomy for T2N1 breast cancer, now on tamoxifen. No evidence of disease. F/u with 6 months, with imaging, she has stable blood counts. I have scheduled her for a bone density test. She'll also have a mammogram in October. I will see her in 6 months time. I recommended that she try some Caltrate D. and some over-the-counter magnesium supplements. 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 She'll let us know if that does help and if the cramps to improve.   More than 50% of the visit was spent in patient-related counselling   Pierce Crane, MD 10/15/20134:46 PM

## 2012-08-13 NOTE — Progress Notes (Signed)
Hematology and Oncology Follow Up Visit  Gloria [name] [id number] 01-11-1928 78 y.o. 08/13/2012 4:37 PM PCP dr Nicki Guadalajara; hal stoneking; pj toth  Principle Diagnosis: .  History of adenocarcinoma with lobular features status post biopsy with trial of neoadjuvant Femara therapy, status post lumpectomy and sentinel lymph node evaluation on 09/13/2010 with residual T2, N1, breast cancer, status post radiation therapy to the breast, completed on 01/23/2011, currently on tamoxifen.  Interim History:  There have been no intercurrent illness, hospitalizations or medication changes. She is here with her granddaughter today. She's been doing well. She does have fairly significant leg cramps. She has tried various:remedies.. She denies any swelling.she has been c/o of some discomfort on the angle of her jaw..  Medications: I have reviewed the patient's current medications.  Allergies:  Allergies  Allergen Reactions  . Contrast Media (Iodinated Diagnostic Agents) Anaphylaxis  . Adhesive (Tape) Other (See Comments)    blisters  . Cephalexin Swelling  . Ciprofloxacin Swelling  . Demerol Nausea And Vomiting  . Penicillins Swelling    REACTION: allergic to penicillin  . Sulfamethoxazole W-Trimethoprim Itching and Swelling    REACTION: swelling/hives  . Quinolones Itching and Rash    REACTION: itching, rash Pt. Reports no problems with levaquin    Past Medical History, Surgical history, Social history, and Family History were reviewed and updated.  Review of Systems: Constitutional:  Negative for fever, chills, night sweats, anorexia, weight loss, c/o lt sided cw pain. Cardiovascular: no chest pain or dyspnea on exertion Respiratory: no cough, shortness of breath, or wheezing Neurological: no TIA or stroke symptoms Dermatological: negative ENT: negative Skin Gastrointestinal: no abdominal pain, change in bowel habits, or black or bloody stools Genito-Urinary: no dysuria, trouble voiding,  or hematuria Hematological and Lymphatic: negative Breast: negative for breast lumps Musculoskeletal: negative Remaining ROS negative.  Physical Exam: Blood pressure 121/80, pulse 74, temperature 98.3 F (36.8 C), temperature source Oral, resp. rate 20, height 5' (1.524 m), weight 135 lb 8 oz (61.462 kg). ECOG: 0 General appearance: alert, cooperative and appears stated age Head: Normocephalic, without obvious abnormality, atraumatic Neck: no adenopathy, no carotid bruit, no JVD, supple, symmetrical, trachea midline and thyroid not enlarged, symmetric, no tenderness/mass/nodules Lymph nodes: Cervical, supraclavicular, and axillary nodes normal. Cardiac : regular rate and rhythm, no murmurs or gallops Pulmonary:clear to auscultation bilaterally and normal percussion bilaterally Breasts: inspection negative, no nipple discharge or bleeding, no masses or nodularity palpable , status post left mastectomy without evidence of local recurrence Abdomen:soft, non-tender; bowel sounds normal; no masses,  no organomegaly Extremities negative Neuro: alert, oriented, normal speech, no focal findings or movement disorder noted  Lab Results: Lab Results  Component Value Date   WBC 4.6 08/13/2012   HGB 13.0 08/13/2012   HCT 37.4 08/13/2012   MCV 94.0 08/13/2012   PLT 138* 08/13/2012     Chemistry      Component Value Date/Time   NA 141 08/13/2012 1504   NA 143 05/14/2012 1139   K 3.5 08/13/2012 1504   K 3.6 05/14/2012 1139   CL 106 08/13/2012 1504   CL 106 05/14/2012 1139   CO2 26 08/13/2012 1504   CO2 30 05/14/2012 1139   BUN 19.0 08/13/2012 1504   BUN 17 05/14/2012 1139   CREATININE 1.0 08/13/2012 1504   CREATININE 0.82 05/14/2012 1139      Component Value Date/Time   CALCIUM 9.2 08/13/2012 1504   CALCIUM 9.1 05/14/2012 1139   ALKPHOS 50 08/13/2012 1504  ALKPHOS 46 05/14/2012 1139   AST 20 08/13/2012 1504   AST 19 05/14/2012 1139   ALT 16 08/13/2012 1504   ALT 10 05/14/2012 1139    BILITOT 0.40 08/13/2012 1504   BILITOT 0.4 05/14/2012 1139      .pathology. Radiological Studies: chest X-ray n/a Mammogram Due 10/13- negative  Bone density n/a  Impression and Plan: Locally advanced breast cancer , er+, on neoadjuvant letrozole s/p lumpectomy for T2N1 breast cancer, now on tamoxifen. No evidence of disease. F/u with 6 months, with imaging, she has stable blood counts. I have scheduled her for a bone density test. I will see her in 6 months. I have recommended that see her primary care doctor. More than 50% of the visit was spent in patient-related counselling   Pierce Crane, MD 10/15/20134:37 PM

## 2012-08-14 LAB — VITAMIN D 25 HYDROXY (VIT D DEFICIENCY, FRACTURES): Vit D, 25-Hydroxy: 45 ng/mL (ref 30–89)

## 2012-08-14 NOTE — Telephone Encounter (Signed)
Please try to move her up

## 2012-08-15 ENCOUNTER — Telehealth (INDEPENDENT_AMBULATORY_CARE_PROVIDER_SITE_OTHER): Payer: Self-pay | Admitting: General Surgery

## 2012-08-15 NOTE — Telephone Encounter (Signed)
Spoke with pt and informed her that her MGM was negative. °

## 2012-08-15 NOTE — Telephone Encounter (Signed)
Message copied by Littie Deeds on Thu Aug 15, 2012  2:22 PM ------      Message from: Caleen Essex III      Created: Thu Aug 15, 2012 12:06 PM       Looks neg

## 2012-08-19 DIAGNOSIS — J301 Allergic rhinitis due to pollen: Secondary | ICD-10-CM | POA: Diagnosis not present

## 2012-08-19 DIAGNOSIS — H9190 Unspecified hearing loss, unspecified ear: Secondary | ICD-10-CM | POA: Diagnosis not present

## 2012-08-26 ENCOUNTER — Ambulatory Visit (INDEPENDENT_AMBULATORY_CARE_PROVIDER_SITE_OTHER): Payer: Medicare Other | Admitting: General Surgery

## 2012-08-26 ENCOUNTER — Encounter (INDEPENDENT_AMBULATORY_CARE_PROVIDER_SITE_OTHER): Payer: Self-pay | Admitting: General Surgery

## 2012-08-26 VITALS — BP 128/64 | HR 80 | Temp 97.2°F | Resp 20 | Ht 60.0 in | Wt 135.4 lb

## 2012-08-26 DIAGNOSIS — C50919 Malignant neoplasm of unspecified site of unspecified female breast: Secondary | ICD-10-CM | POA: Diagnosis not present

## 2012-08-26 NOTE — Patient Instructions (Signed)
Continue regular self exams  

## 2012-08-26 NOTE — Progress Notes (Signed)
Subjective:     Patient ID: Gloria [name], female   DOB: 1928/02/10, 78 y.o.   MRN: [id number]  HPI The patient is a 78 year old white female who is 2 years out from a left mastectomy and axillary lymph node dissection for a T2 N1 left breast cancer. Since her last visit she is developed some problems with her right ear filling stopped up. She did see an ENT doctor but the symptoms seemed to resolve on their own. Otherwise she feels well. She did have a recent mammogram that showed no evidence of malignancy. She denies any chest wall pain.  Review of Systems  Constitutional: Negative.   HENT: Positive for ear pain and facial swelling.   Eyes: Negative.   Respiratory: Negative.   Cardiovascular: Negative.   Gastrointestinal: Negative.   Genitourinary: Negative.   Musculoskeletal: Negative.   Skin: Negative.   Neurological: Negative.   Hematological: Negative.   Psychiatric/Behavioral: Negative.        Objective:   Physical Exam  Constitutional: She is oriented to person, place, and time. She appears well-developed and well-nourished.  HENT:  Head: Normocephalic and atraumatic.  Eyes: Conjunctivae normal and EOM are normal. Pupils are equal, round, and reactive to light.  Neck: Normal range of motion. Neck supple.  Cardiovascular: Normal rate, regular rhythm and normal heart sounds.   Pulmonary/Chest: Effort normal and breath sounds normal.       There is no palpable mass of the left chest wall. There is no palpable mass of the right breast. No palpable axillary or supraclavicular cervical lymphadenopathy. She does have a small persistent palpable seroma left axilla  Abdominal: Soft. Bowel sounds are normal. She exhibits no mass. There is no tenderness.  Musculoskeletal: Normal range of motion.  Lymphadenopathy:    She has no cervical adenopathy.  Neurological: She is alert and oriented to person, place, and time.  Skin: Skin is warm and dry.  Psychiatric: She has a normal mood  and affect. Her behavior is normal.       Assessment:     2 years status post left mastectomy and axillary lymph node dissection    Plan:     At this point she will continue to take tamoxifen. She will continue to do regular self checks. We will plan to see her back in about 6 months

## 2012-08-27 DIAGNOSIS — H353190 Nonexudative age-related macular degeneration, unspecified eye, stage unspecified: Secondary | ICD-10-CM | POA: Diagnosis not present

## 2012-08-27 DIAGNOSIS — H35079 Retinal telangiectasis, unspecified eye: Secondary | ICD-10-CM | POA: Diagnosis not present

## 2012-08-27 DIAGNOSIS — H35369 Drusen (degenerative) of macula, unspecified eye: Secondary | ICD-10-CM | POA: Diagnosis not present

## 2012-08-27 DIAGNOSIS — H35049 Retinal micro-aneurysms, unspecified, unspecified eye: Secondary | ICD-10-CM | POA: Diagnosis not present

## 2012-09-03 ENCOUNTER — Ambulatory Visit (INDEPENDENT_AMBULATORY_CARE_PROVIDER_SITE_OTHER): Payer: Medicare Other | Admitting: General Surgery

## 2012-09-10 DIAGNOSIS — Z23 Encounter for immunization: Secondary | ICD-10-CM | POA: Diagnosis not present

## 2012-09-10 DIAGNOSIS — B379 Candidiasis, unspecified: Secondary | ICD-10-CM | POA: Diagnosis not present

## 2012-09-10 DIAGNOSIS — N952 Postmenopausal atrophic vaginitis: Secondary | ICD-10-CM | POA: Diagnosis not present

## 2012-09-27 ENCOUNTER — Telehealth: Payer: Self-pay | Admitting: *Deleted

## 2012-09-27 NOTE — Telephone Encounter (Signed)
md will be on  moved patient appointment to 11-19-2012 starting at 11:00am with lab mailed out calendar to inform the patien

## 2012-09-30 DIAGNOSIS — Z85828 Personal history of other malignant neoplasm of skin: Secondary | ICD-10-CM | POA: Diagnosis not present

## 2012-09-30 DIAGNOSIS — L821 Other seborrheic keratosis: Secondary | ICD-10-CM | POA: Diagnosis not present

## 2012-10-09 DIAGNOSIS — J209 Acute bronchitis, unspecified: Secondary | ICD-10-CM | POA: Diagnosis not present

## 2012-10-21 DIAGNOSIS — J440 Chronic obstructive pulmonary disease with acute lower respiratory infection: Secondary | ICD-10-CM | POA: Diagnosis not present

## 2012-11-05 ENCOUNTER — Other Ambulatory Visit: Payer: Self-pay | Admitting: *Deleted

## 2012-11-05 ENCOUNTER — Other Ambulatory Visit: Payer: Self-pay | Admitting: Emergency Medicine

## 2012-11-05 DIAGNOSIS — C50919 Malignant neoplasm of unspecified site of unspecified female breast: Secondary | ICD-10-CM

## 2012-11-05 MED ORDER — TAMOXIFEN CITRATE 20 MG PO TABS: 20.0000 mg | ORAL_TABLET | Freq: Every day | ORAL | Status: DC

## 2012-11-05 NOTE — Telephone Encounter (Signed)
Received call from patient stating she wants to cancel refill to [facility] and needs it called into [facility] on [address].  Informed her that I would take of it.

## 2012-11-14 ENCOUNTER — Other Ambulatory Visit: Payer: Medicare Other | Admitting: Lab

## 2012-11-14 ENCOUNTER — Ambulatory Visit: Payer: Medicare Other | Admitting: Oncology

## 2012-11-19 ENCOUNTER — Other Ambulatory Visit: Payer: Medicare Other | Admitting: Lab

## 2012-11-19 ENCOUNTER — Ambulatory Visit: Payer: Medicare Other | Admitting: Oncology

## 2012-11-28 DIAGNOSIS — I25810 Atherosclerosis of coronary artery bypass graft(s) without angina pectoris: Secondary | ICD-10-CM | POA: Diagnosis not present

## 2012-11-28 DIAGNOSIS — I10 Essential (primary) hypertension: Secondary | ICD-10-CM | POA: Diagnosis not present

## 2012-11-28 DIAGNOSIS — I2510 Atherosclerotic heart disease of native coronary artery without angina pectoris: Secondary | ICD-10-CM | POA: Diagnosis not present

## 2012-12-07 ENCOUNTER — Encounter: Payer: Self-pay | Admitting: *Deleted

## 2012-12-07 ENCOUNTER — Telehealth: Payer: Self-pay | Admitting: *Deleted

## 2012-12-07 NOTE — Telephone Encounter (Signed)
Per patient reassignment I have contact the patient. I have explained that Dr. Donnie Coffin has left the practice, but reviewed her chart and wants Dr. Welton Flakes to follow her care. Appts made and letter mailed. JMW

## 2012-12-17 DIAGNOSIS — Z961 Presence of intraocular lens: Secondary | ICD-10-CM | POA: Diagnosis not present

## 2012-12-17 DIAGNOSIS — H0230 Blepharochalasis unspecified eye, unspecified eyelid: Secondary | ICD-10-CM | POA: Diagnosis not present

## 2012-12-17 DIAGNOSIS — H26499 Other secondary cataract, unspecified eye: Secondary | ICD-10-CM | POA: Diagnosis not present

## 2012-12-17 DIAGNOSIS — H04129 Dry eye syndrome of unspecified lacrimal gland: Secondary | ICD-10-CM | POA: Diagnosis not present

## 2012-12-23 DIAGNOSIS — Z79899 Other long term (current) drug therapy: Secondary | ICD-10-CM | POA: Diagnosis not present

## 2012-12-23 DIAGNOSIS — I10 Essential (primary) hypertension: Secondary | ICD-10-CM | POA: Diagnosis not present

## 2012-12-23 DIAGNOSIS — R05 Cough: Secondary | ICD-10-CM | POA: Diagnosis not present

## 2012-12-23 DIAGNOSIS — Z1331 Encounter for screening for depression: Secondary | ICD-10-CM | POA: Diagnosis not present

## 2012-12-23 DIAGNOSIS — H9209 Otalgia, unspecified ear: Secondary | ICD-10-CM | POA: Diagnosis not present

## 2012-12-23 DIAGNOSIS — Z0000 Encounter for general adult medical examination without abnormal findings: Secondary | ICD-10-CM | POA: Diagnosis not present

## 2012-12-24 ENCOUNTER — Encounter: Payer: Self-pay | Admitting: Family

## 2012-12-24 ENCOUNTER — Ambulatory Visit (HOSPITAL_COMMUNITY)
Admission: RE | Admit: 2012-12-24 | Discharge: 2012-12-24 | Disposition: A | Discharge status: Home / Self Care | Payer: Medicare Other | Source: Ambulatory Visit | Attending: Oncology | Admitting: Oncology

## 2012-12-24 ENCOUNTER — Telehealth: Payer: Self-pay | Admitting: Oncology

## 2012-12-24 ENCOUNTER — Ambulatory Visit (HOSPITAL_BASED_OUTPATIENT_CLINIC_OR_DEPARTMENT_OTHER): Payer: Medicare Other | Admitting: Family

## 2012-12-24 ENCOUNTER — Encounter: Payer: Self-pay | Admitting: *Deleted

## 2012-12-24 VITALS — BP 126/81 | HR 81 | Temp 98.6°F | Resp 20 | Ht 60.0 in | Wt 140.1 lb

## 2012-12-24 DIAGNOSIS — C50919 Malignant neoplasm of unspecified site of unspecified female breast: Secondary | ICD-10-CM | POA: Diagnosis not present

## 2012-12-24 DIAGNOSIS — C50912 Malignant neoplasm of unspecified site of left female breast: Secondary | ICD-10-CM

## 2012-12-24 DIAGNOSIS — M7989 Other specified soft tissue disorders: Secondary | ICD-10-CM | POA: Diagnosis not present

## 2012-12-24 DIAGNOSIS — M79605 Pain in left leg: Secondary | ICD-10-CM

## 2012-12-24 DIAGNOSIS — E559 Vitamin D deficiency, unspecified: Secondary | ICD-10-CM

## 2012-12-24 DIAGNOSIS — M79609 Pain in unspecified limb: Secondary | ICD-10-CM

## 2012-12-24 NOTE — Patient Instructions (Addendum)
Please contact us at [phone] if you have any questions or concerns.  Stop taking Tamoxifen as of today.  We will call you with the leg doppler results.

## 2012-12-24 NOTE — Progress Notes (Signed)
[facility]  Telephone:([phone]) [phone] [phone]  OFFICE PROGRESS NOTE  PATIENT: Gloria [name]   DOB: 1928-01-12  MR#: [id number]  ONG#:[id number]  GM:[id number],UYQ [name], MD [name], MD [name]. [name], MD  DIAGNOSIS:  An 79 year old [place] with invasive lobular carcinoma of the left breast diagnosed in 07/2010.  PRIOR THERAPY: 1. The patient noted retraction of her nipple for 3 months prior on the left breast and underwent a mammogram on 08/03/2010 with an ultrasound. Exam showed left nipple retraction, and the palpable mass subareolar left breast at the 6:00 position. Ultrasound confirmed the presence of a mass measuring 2.6 x 1.7 x 2.4 cm.  2. A biopsy was performed on 08/04/2010 which path followed she showed to be an invasive mammary carcinoma with lobular features. ER 95%, PR 97%, Ki-67 17%, HER-2/neu was non amplified with a ratio of 1.  3. An MRI on 08/12/19,011 showed the mass to be larger at 4.6 x 4.2 x 2.3 cm with enhancement distortion extending to the nipple retraction. There is questionable anterior mediastinal lymph node seen.  4. Neoadjuvant antiestrogen therapy with Femara started in 07/2010.  5. Status post left breast mastectomy with sentinel node biopsy on 09/03/2010.  6. The patient was started on antiestrogen therapy with tamoxifen 08/2010.  7. Status post radiation therapy from 12/03/2010 through 01/23/2011.   CURRENT THERAPY:  Tamoxifen 20 mg by mouth daily says 08/2010.   INTERVAL HISTORY: Dr. Welton Flakes and I saw Ms. Gloria [name] today for follow up of her breast cancer. The patient was last seen by Dr. Donnie Coffin on 08/13/2012. Since her last office visit, the patient has complaints of a vaginal discharge that started 6-7 months ago, that is yellow in color non odorous, not pruritic and does not burn. The patient states she seen her GYN and PCP for the vaginal discharge, and was given medication for a  yeast infection, but the  discharge persists. The patient also has complaints of left lower extremity pain that has persisted for the last 2 months. The patient notes night sweats and mild hot flashes. The patient denies any other symptomatology.   PAST MEDICAL HISTORY: Past Medical History  Diagnosis Date  . Cancer     breast- left  . GERD (gastroesophageal reflux disease)   . Hypertension   . Anxiety   . Depression   . Blood in urine   . Knee fracture     PAST SURGICAL HISTORY: Past Surgical History  Procedure Laterality Date  . Tonsillectomy    . Breast surgery  08-30-10    mastectomy  . Colon surgery    . Abdominal hysterectomy    . Cardiac catheterization  08/2011    FAMILY HISTORY: Family History  Problem Relation Age of Onset  . Cancer Father     prostate  . Cancer Sister     breast  . Cancer Brother     pancreatic  . Cancer Maternal Grandmother     colon    SOCIAL HISTORY: History  Substance Use Topics  . Smoking status: Former Smoker    Quit date: 11/15/1991  . Smokeless tobacco: Never Used  . Alcohol Use: 1.2 oz/week    2 Glasses of wine per week     Comment: 1 glass per week    ALLERGIES: Allergies  Allergen Reactions  . Contrast Media (Iodinated Diagnostic Agents) Anaphylaxis  . Adhesive (Tape) Other (See Comments)    blisters  . Cephalexin Swelling  . Ciprofloxacin Swelling  .  Demerol Nausea And Vomiting  . Penicillins Swelling    REACTION: allergic to penicillin  . Sulfamethoxazole W-Trimethoprim Itching and Swelling    REACTION: swelling/hives  . Quinolones Itching and Rash    REACTION: itching, rash Pt. Reports no problems with levaquin     MEDICATIONS:  Current Outpatient Prescriptions  Medication Sig Dispense Refill  . ALPRAZolam (XANAX) 0.25 MG tablet Take 0.25 mg by mouth at bedtime.       Marland Kitchen aspirin 81 MG chewable tablet Chew 81 mg by mouth daily.        . Calcium Carbonate-Vitamin D (CALTRATE 600+D PO) Take by mouth once.      . cetirizine  (ZYRTEC) 10 MG tablet Take 10 mg by mouth at bedtime as needed.       . doxepin (SINEQUAN) 10 MG capsule Take 10 mg by mouth at bedtime.       . Fluticasone Propionate (FLONASE NA) Place into the nose.      Marland Kitchen guaiFENesin (MUCINEX) 600 MG 12 hr tablet Take 1 to 2 every 12 hours as needed for cough/congestion/thick mucus      . losartan-hydrochlorothiazide (HYZAAR) 50-12.5 MG per tablet Take 1 tablet by mouth daily.      . Omeprazole (PRILOSEC PO) Take 20 mg by mouth daily before breakfast.       . sodium chloride (OCEAN) 0.65 % nasal spray Place 2 sprays into the nose every 4 (four) hours as needed. Nasal congestion      . tamoxifen (NOLVADEX) 20 MG tablet Take 1 tablet (20 mg total) by mouth daily.  30 tablet  5   No current facility-administered medications for this visit.      REVIEW OF SYSTEMS: A 10 point review of systems was completed and is negative except as noted above.    PHYSICAL EXAMINATION: BP 126/81  Pulse 81  Temp(Src) 98.6 F (37 C) (Oral)  Resp 20  Ht 5' (1.524 m)  Wt 140 lb 1.6 oz (63.549 kg)  BMI 27.36 kg/m2   General appearance: Alert, cooperative, well nourished, no apparent distress Head: Normocephalic, without obvious abnormality, atraumatic, HOH Eyes: Arcus senilis, PERRLA, EOMI Nose: Nares, septum and mucosa are normal, no drainage, frontal and maxillary sinus tenderness Neck: No adenopathy, supple, symmetrical, trachea midline, thyroid not enlarged, no tenderness Resp: Clear to auscultation bilaterally Cardio: Regular rate and rhythm, S1, S2 normal, no murmur, click, rub or gallop Breasts: Left breast surgically absent, left axillary tenderness, well-healed surgical scar, no lymphadenopathy, no nipple inversion, no axilla fullness, benign breast exam GI: Soft, distended, non-tender, hypoactive bowel sounds, no organomegaly Extremities: Extremities normal, atraumatic, no cyanosis or edema, left lower extremity tenderness Lymph nodes: Cervical,  supraclavicular, and axillary nodes normal Neurologic: Grossly normal    ECOG FS:  Grade 1 - Symptomatic but completely ambulatory   LAB RESULTS: Lab Results  Component Value Date   WBC 4.6 08/13/2012   NEUTROABS 3.1 08/13/2012   HGB 13.0 08/13/2012   HCT 37.4 08/13/2012   MCV 94.0 08/13/2012   PLT 138* 08/13/2012      Chemistry      Component Value Date/Time   NA 141 08/13/2012 1504   NA 143 05/14/2012 1139   K 3.5 08/13/2012 1504   K 3.6 05/14/2012 1139   CL 106 08/13/2012 1504   CL 106 05/14/2012 1139   CO2 26 08/13/2012 1504   CO2 30 05/14/2012 1139   BUN 19.0 08/13/2012 1504   BUN 17 05/14/2012 1139   CREATININE 1.0  08/13/2012 1504   CREATININE 0.82 05/14/2012 1139      Component Value Date/Time   CALCIUM 9.2 08/13/2012 1504   CALCIUM 9.1 05/14/2012 1139   ALKPHOS 50 08/13/2012 1504   ALKPHOS 46 05/14/2012 1139   AST 20 08/13/2012 1504   AST 19 05/14/2012 1139   ALT 16 08/13/2012 1504   ALT 10 05/14/2012 1139   BILITOT 0.40 08/13/2012 1504   BILITOT 0.4 05/14/2012 1139       Lab Results  Component Value Date   LABCA2 29 08/13/2012    RADIOGRAPHIC STUDIES: No results found.  ASSESSMENT: 79 y.o. with: 1. Stage IIIB, T2 N1 invasive lobular carcinoma, grade 2, ER 95%, PR 97%, Ki-67 17%, HER-2/neu no amplification. Status post left breast mastectomy with sentinel biopsy on 09/03/2010. Status post radiation therapy that was completed on 01/23/2011. The patient states she declined chemotherapy.  2. Left lower extremity pain x 2 months.  3. Persistent vaginal discharge.   PLAN: 1. The patient is to stop taking Tamoxifen starting today until she receives further instructions from [place]. Dr. Welton Flakes is considering changing her antiestrogen therapy, possibly to Femara.  2. The patient is being sent to [facility] for a left lower extremity venous duplex to see if she has a DVT. Dr. Welton Flakes explained in detail what treatment she will receive if she is positive for a  left lower extremity DVT.  3. The patient's vaginal discharge may clear up with discontinuing tamoxifen.  4. The patient does not have any bone density scans on record with [place], but states she had a bone density scan within the last year. We have asked her to have this scan forwarded to our office.  5. We plan to see the patient again in 3 months, at which time we will check a CBC and CMP.  All questions were answered.  The patient was encouraged to contact us in the interim with any problems, questions or concerns.    Larina Bras, NP-C 12/25/2012, 8:56 PM

## 2012-12-24 NOTE — Progress Notes (Signed)
*  PRELIMINARY RESULTS* Vascular Ultrasound Left lower extremity venous duplex has been completed.  Preliminary findings: Left:  No evidence of DVT, superficial thrombosis, or Baker's cyst.  Called report to Dr. Darnelle Catalan. He instructed for patient to follow up with Dr. Welton Flakes tomorrow.   Farrel Demark, RDMS, RVT  12/24/2012, 5:58 PM

## 2012-12-24 NOTE — Progress Notes (Signed)
Message left, Letter mailed, waiting patients response.

## 2012-12-24 NOTE — Telephone Encounter (Signed)
Pt given appt for lb tomorrow 2/26 and schedule for June. Pt sent to [facility] for doppler now.

## 2012-12-25 ENCOUNTER — Other Ambulatory Visit: Payer: Medicare Other

## 2012-12-26 ENCOUNTER — Telehealth: Payer: Self-pay | Admitting: Medical Oncology

## 2012-12-26 NOTE — Telephone Encounter (Signed)
Patient should restart the tamoxifen. She needs a follow up appointment with me in 6 months with cbc/cmet

## 2012-12-26 NOTE — Telephone Encounter (Signed)
Per Larina Bras, NP, informed patient the result of the Left Lower Extremity Venous Duplex Evaluation is negative with no evidence of deep vein thrombosis and no evidence of Baker's Cyst. Patient expressed understanding.   Patient wants to know now that she has stopped Tamoxifen what is the "other medication Dr Welton Flakes is going to put me on, she mentioned four?"  Will review patients inquiry with MD and call patient back with answer. Patient with no current sched appts.

## 2012-12-27 ENCOUNTER — Telehealth: Payer: Self-pay | Admitting: Medical Oncology

## 2012-12-27 NOTE — Telephone Encounter (Signed)
LVMOM. Per MD, patient to restart tamoxifen and as noted in onc tx on 02/25 appt with Norina Buzzard NP,  pt to be sched for Labs and MD in 3 months. Patient to call with any questions or concerns.

## 2013-01-08 ENCOUNTER — Encounter (INDEPENDENT_AMBULATORY_CARE_PROVIDER_SITE_OTHER): Payer: Self-pay | Admitting: General Surgery

## 2013-01-10 ENCOUNTER — Other Ambulatory Visit: Payer: Self-pay | Admitting: Emergency Medicine

## 2013-01-10 MED ORDER — TAMOXIFEN CITRATE 20 MG PO TABS: ORAL_TABLET | ORAL | Status: DC

## 2013-02-25 DIAGNOSIS — H35079 Retinal telangiectasis, unspecified eye: Secondary | ICD-10-CM | POA: Diagnosis not present

## 2013-02-25 DIAGNOSIS — H35369 Drusen (degenerative) of macula, unspecified eye: Secondary | ICD-10-CM | POA: Diagnosis not present

## 2013-02-25 DIAGNOSIS — H35049 Retinal micro-aneurysms, unspecified, unspecified eye: Secondary | ICD-10-CM | POA: Diagnosis not present

## 2013-02-25 DIAGNOSIS — H353190 Nonexudative age-related macular degeneration, unspecified eye, stage unspecified: Secondary | ICD-10-CM | POA: Diagnosis not present

## 2013-03-10 ENCOUNTER — Ambulatory Visit (INDEPENDENT_AMBULATORY_CARE_PROVIDER_SITE_OTHER): Payer: Medicare Other | Admitting: General Surgery

## 2013-03-26 ENCOUNTER — Ambulatory Visit (INDEPENDENT_AMBULATORY_CARE_PROVIDER_SITE_OTHER): Payer: Medicare Other | Admitting: General Surgery

## 2013-03-26 ENCOUNTER — Ambulatory Visit
Admission: RE | Admit: 2013-03-26 | Discharge: 2013-03-26 | Disposition: A | Discharge status: Home / Self Care | Payer: Medicare Other | Source: Ambulatory Visit | Attending: General Surgery | Admitting: General Surgery

## 2013-03-26 ENCOUNTER — Encounter (INDEPENDENT_AMBULATORY_CARE_PROVIDER_SITE_OTHER): Payer: Self-pay | Admitting: General Surgery

## 2013-03-26 VITALS — BP 112/68 | HR 84 | Resp 18 | Ht 61.0 in | Wt 139.8 lb

## 2013-03-26 DIAGNOSIS — C50919 Malignant neoplasm of unspecified site of unspecified female breast: Secondary | ICD-10-CM

## 2013-03-26 DIAGNOSIS — C50912 Malignant neoplasm of unspecified site of left female breast: Secondary | ICD-10-CM

## 2013-03-26 DIAGNOSIS — R079 Chest pain, unspecified: Secondary | ICD-10-CM | POA: Diagnosis not present

## 2013-03-26 NOTE — Patient Instructions (Addendum)
Continue tamoxifen Continue regular self exams Will get CXR

## 2013-03-26 NOTE — Progress Notes (Signed)
Subjective:     Patient ID: Gloria [name], female   DOB: 14-Feb-1928, 79 y.o.   MRN: [id number]  HPI The patient is an 79 year old white female who is 2-1/2 years status post left modified radical mastectomy for a T2 N1 breast cancer. Since her last visit she has been well. Her only complaint today is of some point tenderness over her left ribs near her inframammary fold. She is still taking tamoxifen. She also had some left leg pain and was concerned about a clot. She did have a duplex study done that showed no evidence of clot  Review of Systems  Constitutional: Negative.   HENT: Negative.   Eyes: Negative.   Respiratory: Negative.   Cardiovascular: Positive for chest pain.  Gastrointestinal: Negative.   Endocrine: Negative.   Genitourinary: Negative.   Musculoskeletal: Positive for myalgias and arthralgias.  Skin: Negative.   Allergic/Immunologic: Negative.   Neurological: Positive for dizziness.  Hematological: Negative.   Psychiatric/Behavioral: Negative.        Objective:   Physical Exam  Constitutional: She is oriented to person, place, and time. She appears well-developed and well-nourished.  HENT:  Head: Normocephalic and atraumatic.  Eyes: Conjunctivae and EOM are normal. Pupils are equal, round, and reactive to light.  Neck: Normal range of motion. Neck supple.  Cardiovascular: Normal rate, regular rhythm and normal heart sounds.   Pulmonary/Chest: Effort normal and breath sounds normal.  There is no palpable mass of the left chest wall. There is no palpable mass of the right breast. There is no palpable axillary or supraclavicular cervical lymphadenopathy. She does have some point tenderness on the left chest wall and ribs at the inframammary fold  Abdominal: Soft. Bowel sounds are normal. She exhibits no mass. There is no tenderness.  Musculoskeletal: Normal range of motion.  Lymphadenopathy:    She has no cervical adenopathy.  Neurological: She is alert and oriented  to person, place, and time.  Skin: Skin is warm and dry.  Psychiatric: She has a normal mood and affect. Her behavior is normal.       Assessment:     The patient is to here status post left modified radical mastectomy for breast cancer     Plan:     At this point she will continue to do regular self exams. She will continue to take tamoxifen. We will obtain a chest x-ray to look at her left ribs. Otherwise we will see her back in 6 months

## 2013-04-03 ENCOUNTER — Telehealth: Payer: Self-pay | Admitting: Oncology

## 2013-04-03 ENCOUNTER — Other Ambulatory Visit (HOSPITAL_BASED_OUTPATIENT_CLINIC_OR_DEPARTMENT_OTHER): Payer: Medicare Other

## 2013-04-03 ENCOUNTER — Ambulatory Visit (HOSPITAL_BASED_OUTPATIENT_CLINIC_OR_DEPARTMENT_OTHER): Payer: Medicare Other | Admitting: Oncology

## 2013-04-03 VITALS — BP 115/55 | HR 88 | Temp 97.9°F | Resp 20 | Ht 61.0 in | Wt 138.6 lb

## 2013-04-03 DIAGNOSIS — N898 Other specified noninflammatory disorders of vagina: Secondary | ICD-10-CM

## 2013-04-03 DIAGNOSIS — M79605 Pain in left leg: Secondary | ICD-10-CM

## 2013-04-03 DIAGNOSIS — C50912 Malignant neoplasm of unspecified site of left female breast: Secondary | ICD-10-CM

## 2013-04-03 DIAGNOSIS — M79609 Pain in unspecified limb: Secondary | ICD-10-CM

## 2013-04-03 DIAGNOSIS — C50119 Malignant neoplasm of central portion of unspecified female breast: Secondary | ICD-10-CM

## 2013-04-03 DIAGNOSIS — E559 Vitamin D deficiency, unspecified: Secondary | ICD-10-CM

## 2013-04-03 LAB — COMPREHENSIVE METABOLIC PANEL (CC13)
Albumin: 3.5 g/dL (ref 3.5–5.0)
CO2: 27 mEq/L (ref 22–29)
Chloride: 105 mEq/L (ref 98–107)
Glucose: 113 mg/dl — ABNORMAL HIGH (ref 70–99)
Potassium: 3.6 mEq/L (ref 3.5–5.1)
Sodium: 141 mEq/L (ref 136–145)
Total Protein: 6.5 g/dL (ref 6.4–8.3)

## 2013-04-03 LAB — CBC WITH DIFFERENTIAL/PLATELET
Eosinophils Absolute: 0.2 10*3/uL (ref 0.0–0.5)
MONO#: 0.3 10*3/uL (ref 0.1–0.9)
NEUT#: 2.9 10*3/uL (ref 1.5–6.5)
Platelets: 152 10*3/uL (ref 145–400)
RBC: 4.09 10*6/uL (ref 3.70–5.45)
RDW: 12.8 % (ref 11.2–14.5)
WBC: 4.4 10*3/uL (ref 3.9–10.3)
lymph#: 0.9 10*3/uL (ref 0.9–3.3)

## 2013-04-03 NOTE — Patient Instructions (Addendum)
#  1 we discussed results of your chest x-ray as well as the ultrasound of your leg. The chest x-ray was negative. The ultrasound of the leg was negative for a blood clot.  #2 I have recommended holding the tamoxifen for about 6 weeks time to see if the dizziness and leg pain resolved.  #3 I will see you back in 6 weeks' time for followup for reevaluation to see if she can go back on tamoxifen versus using a different drug.

## 2013-04-17 DIAGNOSIS — H903 Sensorineural hearing loss, bilateral: Secondary | ICD-10-CM | POA: Diagnosis not present

## 2013-04-17 DIAGNOSIS — H6120 Impacted cerumen, unspecified ear: Secondary | ICD-10-CM | POA: Diagnosis not present

## 2013-04-27 NOTE — Progress Notes (Signed)
[facility]  Telephone:([phone] [phone]  OFFICE PROGRESS NOTE  PATIENT: Gloria [name]   DOB: [birth year]  MR#: [id number]  JYN#:[id number]  QM:VHQIONGEX,BMW [name], MD [name], MD [name]. [name], MD  DIAGNOSIS:  An 79 year old [place] with invasive lobular carcinoma of the left breast diagnosed in 07/2010.  PRIOR THERAPY: 1. The patient noted retraction of her nipple for 3 months prior on the left breast and underwent a mammogram on 08/03/2010 with an ultrasound. Exam showed left nipple retraction, and the palpable mass subareolar left breast at the 6:00 position. Ultrasound confirmed the presence of a mass measuring 2.6 x 1.7 x 2.4 cm.  2. A biopsy was performed on 08/04/2010 which path followed she showed to be an invasive mammary carcinoma with lobular features. ER 95%, PR 97%, Ki-67 17%, HER-2/neu was non amplified with a ratio of 1.  3. An MRI on 08/12/19,011 showed the mass to be larger at 4.6 x 4.2 x 2.3 cm with enhancement distortion extending to the nipple retraction. There is questionable anterior mediastinal lymph node seen.  4. Neoadjuvant antiestrogen therapy with Femara started in 07/2010.  5. Status post left breast mastectomy with sentinel node biopsy on 09/03/2010.  6. The patient was started on antiestrogen therapy with tamoxifen 08/2010.  7. Status post radiation therapy from 12/03/2010 through 01/23/2011.   CURRENT THERAPY:  Tamoxifen 20 mg by mouth daily since 08/2010.   INTERVAL HISTORY: Patient is seen in followup today. Her last visit was a few months ago when she complained of lower extremity pain. She had Doppler study performed and is negative for DVTs. I do think that it may be due to a Baker's cyst or something. She otherwise feels well no nausea or vomiting no fevers chills night sweats headaches no shortness of breath. I recommended doing exercises and stretching. We will see her back if it does not  improve.    PAST MEDICAL HISTORY: Past Medical History  Diagnosis Date  . Cancer     breast- left  . GERD (gastroesophageal reflux disease)   . Hypertension   . Anxiety   . Depression   . Blood in urine   . Knee fracture     PAST SURGICAL HISTORY: Past Surgical History  Procedure Laterality Date  . Tonsillectomy    . Breast surgery  08-30-10    mastectomy  . Colon surgery    . Abdominal hysterectomy    . Cardiac catheterization  08/2011    FAMILY HISTORY: Family History  Problem Relation Age of Onset  . Cancer Father     prostate  . Cancer Sister     breast  . Cancer Brother     pancreatic  . Cancer Maternal Grandmother     colon    SOCIAL HISTORY: History  Substance Use Topics  . Smoking status: Former Smoker    Quit date: 11/15/1991  . Smokeless tobacco: Never Used  . Alcohol Use: 1.2 oz/week    2 Glasses of wine per week     Comment: 1 glass per week    ALLERGIES: Allergies  Allergen Reactions  . Contrast Media (Iodinated Diagnostic Agents) Anaphylaxis  . Adhesive (Tape) Other (See Comments)    blisters  . Cephalexin Swelling  . Ciprofloxacin Swelling  . Demerol Nausea And Vomiting  . Penicillins Swelling    REACTION: allergic to penicillin  . Sulfamethoxazole W-Trimethoprim Itching and Swelling    REACTION: swelling/hives  . Quinolones Itching and Rash  REACTION: itching, rash Pt. Reports no problems with levaquin     MEDICATIONS:  Current Outpatient Prescriptions  Medication Sig Dispense Refill  . ALPRAZolam (XANAX) 0.25 MG tablet Take 0.25 mg by mouth at bedtime.       Marland Kitchen aspirin 81 MG chewable tablet Chew 81 mg by mouth daily.        . Calcium Carbonate-Vitamin D (CALTRATE 600+D PO) Take by mouth once.      . cetirizine (ZYRTEC) 10 MG tablet Take 10 mg by mouth at bedtime as needed.       . doxepin (SINEQUAN) 10 MG capsule Take 10 mg by mouth at bedtime.       . Fluticasone Propionate (FLONASE NA) Place into the nose.      Marland Kitchen  guaiFENesin (MUCINEX) 600 MG 12 hr tablet Take 1 to 2 every 12 hours as needed for cough/congestion/thick mucus      . losartan-hydrochlorothiazide (HYZAAR) 50-12.5 MG per tablet Take 1 tablet by mouth daily.      . sodium chloride (OCEAN) 0.65 % nasal spray Place 2 sprays into the nose every 4 (four) hours as needed. Nasal congestion      . tamoxifen (NOLVADEX) 20 MG tablet Take 1 tablet (20 mg total) by mouth daily.  30 tablet  5  . Omeprazole (PRILOSEC PO) Take 20 mg by mouth daily before breakfast.       . tamoxifen (NOLVADEX) 20 MG tablet Please give brand name; Not generic.  30 tablet  6   No current facility-administered medications for this visit.      REVIEW OF SYSTEMS: A 10 point review of systems was completed and is negative except as noted above.    PHYSICAL EXAMINATION: BP 115/55  Pulse 88  Temp(Src) 97.9 F (36.6 C) (Oral)  Resp 20  Ht 5\' 1"  (1.549 m)  Wt 138 lb 9.6 oz (62.869 kg)  BMI 26.2 kg/m2   General appearance: Alert, cooperative, well nourished, no apparent distress Head: Normocephalic, without obvious abnormality, atraumatic, HOH Eyes: Arcus senilis, PERRLA, EOMI Nose: Nares, septum and mucosa are normal, no drainage, frontal and maxillary sinus tenderness Neck: No adenopathy, supple, symmetrical, trachea midline, thyroid not enlarged, no tenderness Resp: Clear to auscultation bilaterally Cardio: Regular rate and rhythm, S1, S2 normal, no murmur, click, rub or gallop Breasts: Left breast surgically absent, left axillary tenderness, well-healed surgical scar, no lymphadenopathy, no nipple inversion, no axilla fullness, benign breast exam GI: Soft, distended, non-tender, hypoactive bowel sounds, no organomegaly Extremities: Extremities normal, atraumatic, no cyanosis or edema, left lower extremity tenderness Lymph nodes: Cervical, supraclavicular, and axillary nodes normal Neurologic: Grossly normal    ECOG FS:  Grade 1 - Symptomatic but completely  ambulatory   LAB RESULTS: Lab Results  Component Value Date   WBC 4.4 04/03/2013   NEUTROABS 2.9 04/03/2013   HGB 12.6 04/03/2013   HCT 38.2 04/03/2013   MCV 93.4 04/03/2013   PLT 152 04/03/2013      Chemistry      Component Value Date/Time   NA 141 04/03/2013 1410   NA 143 05/14/2012 1139   K 3.6 04/03/2013 1410   K 3.6 05/14/2012 1139   CL 105 04/03/2013 1410   CL 106 05/14/2012 1139   CO2 27 04/03/2013 1410   CO2 30 05/14/2012 1139   BUN 19.4 04/03/2013 1410   BUN 17 05/14/2012 1139   CREATININE 1.0 04/03/2013 1410   CREATININE 0.82 05/14/2012 1139      Component Value Date/Time  CALCIUM 9.0 04/03/2013 1410   CALCIUM 9.1 05/14/2012 1139   ALKPHOS 51 04/03/2013 1410   ALKPHOS 46 05/14/2012 1139   AST 21 04/03/2013 1410   AST 19 05/14/2012 1139   ALT 12 04/03/2013 1410   ALT 10 05/14/2012 1139   BILITOT 0.45 04/03/2013 1410   BILITOT 0.4 05/14/2012 1139       Lab Results  Component Value Date   LABCA2 29 08/13/2012    RADIOGRAPHIC STUDIES: No results found.  ASSESSMENT: 79 y.o. with:  1. Stage IIIB, T2 N1 invasive lobular carcinoma, grade 2, ER 95%, PR 97%, Ki-67 17%, HER-2/neu no amplification. Status post left breast mastectomy with sentinel biopsy on 09/03/2010. Status post radiation therapy that was completed on 01/23/2011. The patient states she declined chemotherapy.  2. Left lower extremity pain x 2 months.  3. Persistent vaginal discharge.   PLAN: #1 no evidence of DVT.  #2 return in 6 months time for followup.  All questions were answered.  The patient was encouraged to contact us in the interim with any problems, questions or concerns.   Drue Second, MD Medical/Oncology [facility] [phone] (beeper) [phone] (Office)

## 2013-05-06 ENCOUNTER — Encounter: Payer: Self-pay | Admitting: Cardiology

## 2013-05-06 ENCOUNTER — Other Ambulatory Visit: Payer: Self-pay | Admitting: Cardiology

## 2013-05-06 ENCOUNTER — Ambulatory Visit (INDEPENDENT_AMBULATORY_CARE_PROVIDER_SITE_OTHER): Payer: Medicare Other | Admitting: Cardiology

## 2013-05-06 VITALS — BP 102/60 | HR 89 | Wt 137.6 lb

## 2013-05-06 DIAGNOSIS — I739 Peripheral vascular disease, unspecified: Secondary | ICD-10-CM

## 2013-05-06 DIAGNOSIS — R0609 Other forms of dyspnea: Secondary | ICD-10-CM | POA: Diagnosis not present

## 2013-05-06 DIAGNOSIS — R0989 Other specified symptoms and signs involving the circulatory and respiratory systems: Secondary | ICD-10-CM | POA: Diagnosis not present

## 2013-05-06 DIAGNOSIS — I2510 Atherosclerotic heart disease of native coronary artery without angina pectoris: Secondary | ICD-10-CM | POA: Diagnosis not present

## 2013-05-06 DIAGNOSIS — I10 Essential (primary) hypertension: Secondary | ICD-10-CM

## 2013-05-06 DIAGNOSIS — C50919 Malignant neoplasm of unspecified site of unspecified female breast: Secondary | ICD-10-CM

## 2013-05-06 DIAGNOSIS — C50912 Malignant neoplasm of unspecified site of left female breast: Secondary | ICD-10-CM

## 2013-05-06 MED ORDER — AMLODIPINE BESYLATE 5 MG PO TABS: 5.0000 mg | ORAL_TABLET | Freq: Every day | ORAL | Status: DC

## 2013-05-06 NOTE — Assessment & Plan Note (Addendum)
I have changed  losartan HCTZ to Norvasc.  She has had some ankle edema which may reoccurred now that she's off diuretic. But her leg cramping and leg pain has been so significant she needs a trial off diuretics for now.

## 2013-05-06 NOTE — Assessment & Plan Note (Addendum)
She is more short of breath with ambulation than she has been.  She did undergo radiation with her breast cancer.  I'll repeat 2-D echo to assess LV function.  She also has some mild chest pressure when she exerts but that has been occurring for some time.  Cardiac cath in 2012 revealed only nonobstructive disease.

## 2013-05-06 NOTE — Assessment & Plan Note (Signed)
Followed by Dr. Welton Flakes,  Pt rec'd radiation but no chemo

## 2013-05-06 NOTE — Assessment & Plan Note (Addendum)
Leg pain maybe combination of arterial insuff. And medications. I have stopped her losartan/HCTZ.  I would've preferred only to stop the HCTZ but she was pretty adamant about stopping both. For her blood pressure I added Norvasc 5 mg daily. We'll also check arterial lower extremity Dopplers. In addition she had venous Doppler done there was no Baker's cyst on the left and no DVT on the left.

## 2013-05-06 NOTE — Progress Notes (Signed)
05/06/2013   PCP: Ginette Otto, MD   Chief Complaint  Patient presents with  . Follow-up    pain in legs, losartan is cause her very bad leg cramps     Primary Cardiologist:Dr. Tresa Endo  HPI:  79 year-old white female presents today with complaints of leg pain. She has a history of left breast cancer mastectomy and radiation and has been on chronic tamoxifen. She had a positive nuclear stress test in 2012 and underwent cardiac cath which revealed minimal nonobstructive coronary disease. Other history hypertension history of asthma and GERD.  Recently she does complain of increasing dyspnea on exertion over the last year she also complains of bilateral leg pain.  With dyspnea on exertion she may have mild chest discomfort.    Her leg pain she has seen Dr. Welton Flakes who ordered venous Dopplers which did not reveal any DVT nor Baker's cyst. Patient describes the pain during the day when she first gets up in the morning her legs are painful though they do improve slightly during the day, but then when she ambulates she has to stop and rest due to the pain. The rest does help relieve the pain. At night she has severe cramping in her legs and feet. She is more concerned that this is related to her medications. Her tamoxifen has been on hold for 4 weeks now without improvement of the leg pain.  She herself has held losartan HCTZ for today.    Allergies  Allergen Reactions  . Contrast Media (Iodinated Diagnostic Agents) Anaphylaxis  . Adhesive (Tape) Other (See Comments)    blisters  . Cephalexin Swelling  . Ciprofloxacin Swelling  . Demerol Nausea And Vomiting  . Penicillins Swelling    REACTION: allergic to penicillin  . Sulfamethoxazole W-Trimethoprim Itching and Swelling    REACTION: swelling/hives  . Quinolones Itching and Rash    REACTION: itching, rash Pt. Reports no problems with levaquin    Current Outpatient Prescriptions  Medication Sig Dispense Refill  .  ALPRAZolam (XANAX) 0.25 MG tablet Take 0.25 mg by mouth at bedtime.       Marland Kitchen aspirin 81 MG chewable tablet Chew 81 mg by mouth daily.        . Calcium Carbonate-Vitamin D (CALTRATE 600+D PO) Take by mouth once.      . cetirizine (ZYRTEC) 10 MG tablet Take 10 mg by mouth at bedtime as needed.       . doxepin (SINEQUAN) 10 MG capsule Take 10 mg by mouth at bedtime.       . Fluticasone Propionate (FLONASE NA) Place into the nose.      Marland Kitchen guaiFENesin (MUCINEX) 600 MG 12 hr tablet Take 1 to 2 every 12 hours as needed for cough/congestion/thick mucus      . Omeprazole (PRILOSEC PO) Take 20 mg by mouth daily before breakfast.       . sodium chloride (OCEAN) 0.65 % nasal spray Place 2 sprays into the nose every 4 (four) hours as needed. Nasal congestion      . amLODipine (NORVASC) 5 MG tablet Take 1 tablet (5 mg total) by mouth daily.  30 tablet  6  . tamoxifen (NOLVADEX) 20 MG tablet Take 1 tablet (20 mg total) by mouth daily.  30 tablet  5  . tamoxifen (NOLVADEX) 20 MG tablet Please give brand name; Not generic.  30 tablet  6   No current facility-administered medications for this visit.    Past Medical History  Diagnosis Date  . Cancer     breast- left  . GERD (gastroesophageal reflux disease)   . Hypertension   . Anxiety   . Depression   . Blood in urine   . Knee fracture   . CAD (coronary artery disease)     non obstructive by cath  . TR (tricuspid regurgitation)     mild by Echo 12/2008 EF >55%    Past Surgical History  Procedure Laterality Date  . Tonsillectomy    . Breast surgery  08-30-10    mastectomy  . Colon surgery    . Abdominal hysterectomy    . Cardiac catheterization  08/2011    20% LAD stenosis, 20% diagonal stenosis, 10-20% proximal dominant RCA stenosis    OZH:YQMVHQI:ON colds or fevers, no weight changes Skin:no rashes or ulcers HEENT:no blurred vision, no congestion CV:see HPI PUL:see HPI GI:no diarrhea constipation or melena, no indigestion GU:no hematuria,  no dysuria MS:no joint pain, + claudication of lower ext Neuro:no syncope, no lightheadedness Endo:no diabetes, no thyroid disease  PHYSICAL EXAM BP 102/60  Pulse 89  Wt 137 lb 9.6 oz (62.415 kg)  BMI 26.01 kg/m2 General:Pleasant affect, NAD Skin:Warm and dry, brisk capillary refill HEENT:normocephalic, sclera clear, mucus membranes moist Neck:supple, no JVD, no bruits  Heart:S1S2 RRR with soft systolic murmur, gallup, rub or click Lungs:clear without rales, rhonchi, or wheezes GEX:BMWU, non tender, + BS, do not palpate liver spleen or masses Ext:no lower ext edema, 2+ pedal pulses,  2+ Rt post tib, ? Lt post tib, .2+ radial pulses Neuro:alert and oriented, MAE, follows commands, + facial symmetry EKG: Sinus rhythm with PVC but no acute changes from previous tracings.  ASSESSMENT AND PLAN Claudication Leg pain maybe combination of arterial insuff. And medications. I have stopped her losartan/HCTZ.  I would've preferred only to stop the HCTZ but she was pretty adamant about stopping both. For her blood pressure I added Norvasc 5 mg daily. We'll also check arterial lower extremity Dopplers. In addition she had venous Doppler done there was no Baker's cyst on the left and no DVT on the left.     DOE (dyspnea on exertion) She is more short of breath with ambulation than she has been.  She did undergo radiation with her breast cancer.  I'll repeat 2-D echo to assess LV function.  She also has some mild chest pressure when she exerts but that has been occurring for some time.  Cardiac cath in 2012 revealed only nonobstructive disease.  HYPERTENSION, BENIGN I have changed  losartan HCTZ to Norvasc.  She has had some ankle edema which may reoccurred now that she's off diuretic. But her leg cramping and leg pain has been so significant she needs a trial off diuretics for now.  Breast cancer Followed by Dr. Welton Flakes,  Pt rec'd radiation but no chemo   I also asked the patient to have lab work  today comprehensive metabolic panel, Magnesium, and TSH. She will follow with Dr. Tresa Endo in 3-4 weeks that we will call her the results of the tests prior to that time

## 2013-05-06 NOTE — Patient Instructions (Addendum)
Stop the losartan/hctz  If your legs swell more call us.  Have blood work done.  We will check blood flow to legs with arterial doppler.  We will check echo to see how your heart is pumping.  Follow up with Dr. Tresa Endo in 3-4 weeks, though we will call you results before then.  Call for increasing shortness of breath and/or chest pain.

## 2013-05-07 ENCOUNTER — Ambulatory Visit (HOSPITAL_COMMUNITY): Payer: Medicare Other

## 2013-05-07 LAB — BASIC METABOLIC PANEL WITH GFR
BUN: 20 mg/dL (ref 6–23)
Calcium: 9.2 mg/dL (ref 8.4–10.5)
Calcium: 9.3 mg/dL (ref 8.4–10.5)
Creat: 0.98 mg/dL (ref 0.50–1.10)
GFR, Est African American: 62 mL/min
GFR, Est African American: 57 mL/min — ABNORMAL LOW
GFR, Est Non African American: 54 mL/min — ABNORMAL LOW
GFR, Est Non African American: 50 mL/min — ABNORMAL LOW
Glucose, Bld: 64 mg/dL — ABNORMAL LOW (ref 70–99)
Potassium: 4.2 mEq/L (ref 3.5–5.3)
Potassium: 4.2 mEq/L (ref 3.5–5.3)
Sodium: 142 mEq/L (ref 135–145)

## 2013-05-07 LAB — TSH: TSH: 0.595 u[IU]/mL (ref 0.350–4.500)

## 2013-05-09 ENCOUNTER — Ambulatory Visit (HOSPITAL_COMMUNITY)
Admission: RE | Admit: 2013-05-09 | Discharge: 2013-05-09 | Disposition: A | Discharge status: Home / Self Care | Payer: Medicare Other | Source: Ambulatory Visit | Attending: Cardiovascular Disease | Admitting: Cardiovascular Disease

## 2013-05-09 DIAGNOSIS — I2510 Atherosclerotic heart disease of native coronary artery without angina pectoris: Secondary | ICD-10-CM | POA: Diagnosis not present

## 2013-05-09 DIAGNOSIS — I079 Rheumatic tricuspid valve disease, unspecified: Secondary | ICD-10-CM | POA: Diagnosis not present

## 2013-05-09 DIAGNOSIS — I059 Rheumatic mitral valve disease, unspecified: Secondary | ICD-10-CM | POA: Diagnosis not present

## 2013-05-09 DIAGNOSIS — R0609 Other forms of dyspnea: Secondary | ICD-10-CM | POA: Insufficient documentation

## 2013-05-09 DIAGNOSIS — R0989 Other specified symptoms and signs involving the circulatory and respiratory systems: Secondary | ICD-10-CM | POA: Insufficient documentation

## 2013-05-09 DIAGNOSIS — I10 Essential (primary) hypertension: Secondary | ICD-10-CM | POA: Insufficient documentation

## 2013-05-09 NOTE — Progress Notes (Signed)
[facility]   2D echo completed 05/09/2013.   Cindy Shuntel Fishburn, RDCS  

## 2013-05-16 ENCOUNTER — Telehealth: Payer: Self-pay | Admitting: *Deleted

## 2013-05-16 ENCOUNTER — Telehealth: Payer: Self-pay | Admitting: Cardiovascular Disease

## 2013-05-16 ENCOUNTER — Encounter: Payer: Self-pay | Admitting: Oncology

## 2013-05-16 ENCOUNTER — Ambulatory Visit (HOSPITAL_BASED_OUTPATIENT_CLINIC_OR_DEPARTMENT_OTHER): Payer: Medicare Other | Admitting: Oncology

## 2013-05-16 VITALS — BP 111/70 | HR 84 | Temp 98.3°F | Resp 20 | Ht 61.0 in | Wt 139.1 lb

## 2013-05-16 DIAGNOSIS — C50912 Malignant neoplasm of unspecified site of left female breast: Secondary | ICD-10-CM

## 2013-05-16 DIAGNOSIS — C50919 Malignant neoplasm of unspecified site of unspecified female breast: Secondary | ICD-10-CM | POA: Diagnosis not present

## 2013-05-16 NOTE — Telephone Encounter (Deleted)
Pt had 11 seconds of PAF. Please make sure he is on an ASA a day (325mg ). Also, add Metoprolol tartrate 25 mg BID. Keep follow up with Dr Allyson Sabal.  Corine Shelter PA-C 05/16/2013 2:11 PM

## 2013-05-16 NOTE — Telephone Encounter (Signed)
Returned call and informed pt per instructions by MD/PA.  Pt verbalized understanding and agreed w/ plan.  

## 2013-05-16 NOTE — Telephone Encounter (Signed)
Message copied by Tobin Chad on Fri May 16, 2013 11:26 AM ------      Message from: Leone Brand      Created: Sat May 10, 2013  5:22 PM       Stable echo. Pls. Notify pt. ------

## 2013-05-16 NOTE — Telephone Encounter (Signed)
appts made and printed...td 

## 2013-05-16 NOTE — Telephone Encounter (Signed)
The blood pressure medication that she was put on is causing her legs to swell something awful. (Amlodpine 05mg ).. Please Call   Thanks

## 2013-05-16 NOTE — Telephone Encounter (Signed)
Left message on home phone . Was able to speak to patient on cell. Results given.

## 2013-05-16 NOTE — Telephone Encounter (Signed)
Returned call.  Pt stated she has a lot of "swelling at night time with this drug."  Wanted to know if she can take something else.  Stated the losartan caused leg cramps, but she may just have to deal w/ that.  Pt informed per OV note, NP wanted to stop just the HCTZ, but she (pt) wanted to stop both.  Pt stated she needs a diuretic.  Stated she is just going to start back on the losartan/hctz and deal w/ the cramps.  Pt informed RN will discuss with MD/PA for further instructions.  Pt verbalized understanding and agreed w/ plan.  Message forwarded to Hinda Glatter, PA-C for further instructions.  Last OV note in Epic.

## 2013-05-16 NOTE — Telephone Encounter (Signed)
OK to resume her previous medications and stop Norvasc.  Corine Shelter PA-C 05/16/2013 2:24 PM

## 2013-05-21 ENCOUNTER — Ambulatory Visit (HOSPITAL_COMMUNITY)
Admission: RE | Admit: 2013-05-21 | Discharge: 2013-05-21 | Disposition: A | Discharge status: Home / Self Care | Payer: Medicare Other | Source: Ambulatory Visit | Attending: Cardiovascular Disease | Admitting: Cardiovascular Disease

## 2013-05-21 DIAGNOSIS — M25569 Pain in unspecified knee: Secondary | ICD-10-CM | POA: Diagnosis not present

## 2013-05-21 DIAGNOSIS — I739 Peripheral vascular disease, unspecified: Secondary | ICD-10-CM | POA: Insufficient documentation

## 2013-05-21 DIAGNOSIS — M1710 Unilateral primary osteoarthritis, unspecified knee: Secondary | ICD-10-CM | POA: Diagnosis not present

## 2013-05-21 DIAGNOSIS — IMO0002 Reserved for concepts with insufficient information to code with codable children: Secondary | ICD-10-CM | POA: Diagnosis not present

## 2013-05-21 NOTE — Progress Notes (Signed)
Lower Extremity Arterial Duplex Completed. °Gloria [name] ° °

## 2013-06-02 ENCOUNTER — Encounter: Payer: Self-pay | Admitting: Cardiovascular Disease

## 2013-06-03 ENCOUNTER — Ambulatory Visit (INDEPENDENT_AMBULATORY_CARE_PROVIDER_SITE_OTHER): Payer: Medicare Other | Admitting: Cardiovascular Disease

## 2013-06-03 ENCOUNTER — Encounter: Payer: Self-pay | Admitting: Cardiovascular Disease

## 2013-06-03 VITALS — BP 132/76 | HR 63 | Ht 60.5 in | Wt 142.5 lb

## 2013-06-03 DIAGNOSIS — I2510 Atherosclerotic heart disease of native coronary artery without angina pectoris: Secondary | ICD-10-CM

## 2013-06-03 DIAGNOSIS — K219 Gastro-esophageal reflux disease without esophagitis: Secondary | ICD-10-CM

## 2013-06-03 DIAGNOSIS — I10 Essential (primary) hypertension: Secondary | ICD-10-CM | POA: Diagnosis not present

## 2013-06-03 NOTE — Patient Instructions (Addendum)
Your physician recommends that you schedule a follow-up appointment in: 6 months. No changes made today in your therapy.

## 2013-06-08 NOTE — Progress Notes (Signed)
[facility]  Telephone:([phone] Fax:(336) [phone]  OFFICE PROGRESS NOTE  PATIENT: Gloria [name]   DOB: 1928/06/08  MR#: [id number]  JYN#:[id number]  QM:VHQIONGEX,BMW [name], MD [name], MD [name]. [name], MD  DIAGNOSIS:  An 79 year old [place] with invasive lobular carcinoma of the left breast diagnosed in 07/2010.  PRIOR THERAPY: 1. The patient noted retraction of her nipple for 3 months prior on the left breast and underwent a mammogram on 08/03/2010 with an ultrasound. Exam showed left nipple retraction, and the palpable mass subareolar left breast at the 6:00 position. Ultrasound confirmed the presence of a mass measuring 2.6 x 1.7 x 2.4 cm.  2. A biopsy was performed on 08/04/2010 which path followed she showed to be an invasive mammary carcinoma with lobular features. ER 95%, PR 97%, Ki-67 17%, HER-2/neu was non amplified with a ratio of 1.  3. An MRI on 08/12/19,011 showed the mass to be larger at 4.6 x 4.2 x 2.3 cm with enhancement distortion extending to the nipple retraction. There is questionable anterior mediastinal lymph node seen.  4. Neoadjuvant antiestrogen therapy with Femara started in 07/2010.  5. Status post left breast mastectomy with sentinel node biopsy on 09/03/2010.  6. The patient was started on antiestrogen therapy with tamoxifen 08/2010.  7. Status post radiation therapy from 12/03/2010 through 01/23/2011.   CURRENT THERAPY:  Tamoxifen 20 mg by mouth daily since 08/2010.   INTERVAL HISTORY: Patient is seen in followup today. Her last visit was a few months ago when she complained of lower extremity pain. She had Doppler study performed and is negative for DVTs. I do think that it may be due to a Baker's cyst or something. She otherwise feels well no nausea or vomiting no fevers chills night sweats headaches no shortness of breath. I recommended doing exercises and stretching. We will see her back if it does not  improve.    PAST MEDICAL HISTORY: Past Medical History  Diagnosis Date  . Cancer     breast- left  . GERD (gastroesophageal reflux disease)   . Hypertension   . Anxiety   . Depression   . Blood in urine   . Knee fracture   . CAD (coronary artery disease)     non obstructive by cath  . TR (tricuspid regurgitation)     mild by Echo 12/2008 EF >55%    PAST SURGICAL HISTORY: Past Surgical History  Procedure Laterality Date  . Tonsillectomy    . Breast surgery  08-30-10    mastectomy  . Colon surgery    . Abdominal hysterectomy    . Cardiac catheterization  08/2011    20% LAD stenosis, 20% diagonal stenosis, 10-20% proximal dominant RCA stenosis    FAMILY HISTORY: Family History  Problem Relation Age of Onset  . Cancer Father     prostate  . Cancer Sister     breast  . Heart disease Sister   . Cancer Brother     pancreatic  . Cancer Maternal Grandmother     colon  . Dementia Mother   . Sudden death Brother   . Heart attack Brother   . Heart attack Brother   . Arthritis Sister     SOCIAL HISTORY: History  Substance Use Topics  . Smoking status: Former Smoker    Quit date: 11/15/1991  . Smokeless tobacco: Never Used  . Alcohol Use: 1.2 oz/week    2 Glasses of wine per week  Comment: 1 glass per week    ALLERGIES: Allergies  Allergen Reactions  . Contrast Media (Iodinated Diagnostic Agents) Anaphylaxis  . Adhesive (Tape) Other (See Comments)    blisters  . Cephalexin Swelling  . Ciprofloxacin Swelling  . Demerol Nausea And Vomiting  . Penicillins Swelling    REACTION: allergic to penicillin  . Sulfamethoxazole W-Trimethoprim Itching and Swelling    REACTION: swelling/hives  . Quinolones Itching and Rash    REACTION: itching, rash Pt. Reports no problems with levaquin     MEDICATIONS:  Current Outpatient Prescriptions  Medication Sig Dispense Refill  . ALPRAZolam (XANAX) 0.25 MG tablet Take 0.25 mg by mouth at bedtime.       Marland Kitchen aspirin 81  MG chewable tablet Chew 81 mg by mouth daily.        . Calcium Carbonate-Vitamin D (CALTRATE 600+D PO) Take by mouth once.      . cetirizine (ZYRTEC) 10 MG tablet Take 10 mg by mouth at bedtime as needed.       . doxepin (SINEQUAN) 10 MG capsule Take 10 mg by mouth at bedtime.       . Fluticasone Propionate (FLONASE NA) Place into the nose.      Marland Kitchen guaiFENesin (MUCINEX) 600 MG 12 hr tablet Take 1 to 2 every 12 hours as needed for cough/congestion/thick mucus      . MAGNESIUM PO Take by mouth.      . Omeprazole (PRILOSEC PO) Take 20 mg by mouth daily before breakfast.       . sodium chloride (OCEAN) 0.65 % nasal spray Place 2 sprays into the nose every 4 (four) hours as needed. Nasal congestion      . tamoxifen (NOLVADEX) 20 MG tablet Please give brand name; Not generic.  30 tablet  6  . losartan-hydrochlorothiazide (HYZAAR) 50-12.5 MG per tablet Take 1 tablet by mouth daily.       No current facility-administered medications for this visit.      REVIEW OF SYSTEMS: A 10 point review of systems was completed and is negative except as noted above.    PHYSICAL EXAMINATION: BP 111/70  Pulse 84  Temp(Src) 98.3 F (36.8 C) (Oral)  Resp 20  Ht 5\' 1"  (1.549 m)  Wt 139 lb 1.6 oz (63.095 kg)  BMI 26.3 kg/m2   General appearance: Alert, cooperative, well nourished, no apparent distress Head: Normocephalic, without obvious abnormality, atraumatic, HOH Eyes: Arcus senilis, PERRLA, EOMI Nose: Nares, septum and mucosa are normal, no drainage, frontal and maxillary sinus tenderness Neck: No adenopathy, supple, symmetrical, trachea midline, thyroid not enlarged, no tenderness Resp: Clear to auscultation bilaterally Cardio: Regular rate and rhythm, S1, S2 normal, no murmur, click, rub or gallop Breasts: Left breast surgically absent, left axillary tenderness, well-healed surgical scar, no lymphadenopathy, no nipple inversion, no axilla fullness, benign breast exam GI: Soft, distended, non-tender,  hypoactive bowel sounds, no organomegaly Extremities: Extremities normal, atraumatic, no cyanosis or edema, left lower extremity tenderness Lymph nodes: Cervical, supraclavicular, and axillary nodes normal Neurologic: Grossly normal    ECOG FS:  Grade 1 - Symptomatic but completely ambulatory   LAB RESULTS: Lab Results  Component Value Date   WBC 4.4 04/03/2013   NEUTROABS 2.9 04/03/2013   HGB 12.6 04/03/2013   HCT 38.2 04/03/2013   MCV 93.4 04/03/2013   PLT 152 04/03/2013      Chemistry      Component Value Date/Time   NA 141 05/06/2013 1515   NA 141 04/03/2013 1410  K 4.2 05/06/2013 1515   K 3.6 04/03/2013 1410   CL 103 05/06/2013 1515   CL 105 04/03/2013 1410   CO2 33* 05/06/2013 1515   CO2 27 04/03/2013 1410   BUN 20 05/06/2013 1515   BUN 19.4 04/03/2013 1410   CREATININE 0.98 05/06/2013 1515   CREATININE 1.0 04/03/2013 1410   CREATININE 0.82 05/14/2012 1139      Component Value Date/Time   CALCIUM 9.2 05/06/2013 1515   CALCIUM 9.0 04/03/2013 1410   ALKPHOS 51 04/03/2013 1410   ALKPHOS 46 05/14/2012 1139   AST 21 04/03/2013 1410   AST 19 05/14/2012 1139   ALT 12 04/03/2013 1410   ALT 10 05/14/2012 1139   BILITOT 0.45 04/03/2013 1410   BILITOT 0.4 05/14/2012 1139       Lab Results  Component Value Date   LABCA2 29 08/13/2012    RADIOGRAPHIC STUDIES: No results found.  ASSESSMENT: 79 y.o. with:  1. Stage IIIB, T2 N1 invasive lobular carcinoma, grade 2, ER 95%, PR 97%, Ki-67 17%, HER-2/neu no amplification. Status post left breast mastectomy with sentinel biopsy on 09/03/2010. Status post radiation therapy that was completed on 01/23/2011. The patient states she declined chemotherapy.  2. Left lower extremity pain x 2 months.  3. Persistent vaginal discharge.   PLAN: #1 no evidence of DVT.  #2 return in 6 months time for followup.  All questions were answered.  The patient was encouraged to contact us in the interim with any problems, questions or concerns.   Drue Second,  MD Medical/Oncology [facility] [phone] (beeper) [phone] (Office)

## 2013-06-15 ENCOUNTER — Encounter: Payer: Self-pay | Admitting: Cardiovascular Disease

## 2013-06-15 NOTE — Progress Notes (Signed)
Patient ID: Gloria [name], female   DOB: 1927/12/08, 79 y.o.   MRN: [id number]     HPI: Gloria [name], is a 79 y.o. female who presents for seven-month cardiology evaluation. I last saw her in January 2014 but I understand she had seen Nada Boozer in July.  Gloria [name] and has documented mild nonobstructive CAD involving her LAD, diagonal and right coronary artery by catheterization which was last done by me on 09/08/2011. She does have a history of hypertension, and breast CA. She status post left mastectomy and radiation therapy and has been on chronic tamoxifen she did note some dizziness in the past which she attributed to her tamoxifen. Recently, she had noticed some leg discomfort and recent 70 Doppler studies were normal. A recent 2-D echo Doppler study on 05/09/2013 showed an ejection fraction of 55-60% there was evidence for mild tricuspid regurgitation mild pulmonary hypertension with estimated PA pressure 34 mm. Apparently, she had thighs to discontinue her losartan and switched from low to pain but she never did this. At the was some concern when she was seen perhaps at the leg discomfort may have been due to losartan. Her leg discomfort has improved and has continued taking losartan HCT  Past Medical History  Diagnosis Date  . Cancer     breast- left  . GERD (gastroesophageal reflux disease)   . Hypertension   . Anxiety   . Depression   . Blood in urine   . Knee fracture   . CAD (coronary artery disease)     non obstructive by cath  . TR (tricuspid regurgitation)     mild by Echo 12/2008 EF >55%    Past Surgical History  Procedure Laterality Date  . Tonsillectomy    . Breast surgery  08-30-10    mastectomy  . Colon surgery    . Abdominal hysterectomy    . Cardiac catheterization  08/2011    20% LAD stenosis, 20% diagonal stenosis, 10-20% proximal dominant RCA stenosis    Allergies  Allergen Reactions  . Contrast Media [Iodinated Diagnostic Agents]  Anaphylaxis  . Adhesive [Tape] Other (See Comments)    blisters  . Cephalexin Swelling  . Ciprofloxacin Swelling  . Demerol Nausea And Vomiting  . Penicillins Swelling    REACTION: allergic to penicillin  . Sulfamethoxazole W-Trimethoprim Itching and Swelling    REACTION: swelling/hives  . Quinolones Itching and Rash    REACTION: itching, rash Pt. Reports no problems with levaquin    Current Outpatient Prescriptions  Medication Sig Dispense Refill  . ALPRAZolam (XANAX) 0.25 MG tablet Take 0.25 mg by mouth at bedtime.       Marland Kitchen aspirin 81 MG chewable tablet Chew 81 mg by mouth daily.        . Calcium Carbonate-Vitamin D (CALTRATE 600+D PO) Take by mouth once.      . cetirizine (ZYRTEC) 10 MG tablet Take 10 mg by mouth at bedtime as needed.       . doxepin (SINEQUAN) 10 MG capsule Take 10 mg by mouth at bedtime.       . Fluticasone Propionate (FLONASE NA) Place into the nose.      Marland Kitchen guaiFENesin (MUCINEX) 600 MG 12 hr tablet Take 1 to 2 every 12 hours as needed for cough/congestion/thick mucus      . losartan-hydrochlorothiazide (HYZAAR) 50-12.5 MG per tablet Take 1 tablet by mouth daily.      Marland Kitchen MAGNESIUM PO Take by mouth.      Marland Kitchen  Omeprazole (PRILOSEC PO) Take 20 mg by mouth daily before breakfast.       . sodium chloride (OCEAN) 0.65 % nasal spray Place 2 sprays into the nose every 4 (four) hours as needed. Nasal congestion      . tamoxifen (NOLVADEX) 20 MG tablet Please give brand name; Not generic.  30 tablet  6   No current facility-administered medications for this visit.    History   Social History  . Marital Status: Divorced    Spouse Name: N/A    Number of Children: N/A  . Years of Education: N/A   Occupational History  . Not on file.   Social History Main Topics  . Smoking status: Former Smoker    Quit date: 11/15/1991  . Smokeless tobacco: Never Used  . Alcohol Use: 1.2 oz/week    2 Glasses of wine per week     Comment: 1 glass per week  . Drug Use: No  .  Sexual Activity: Not on file   Other Topics Concern  . Not on file   Social History Narrative  . No narrative on file    Family History  Problem Relation Age of Onset  . Cancer Father     prostate  . Cancer Sister     breast  . Heart disease Sister   . Cancer Brother     pancreatic  . Cancer Maternal Grandmother     colon  . Dementia Mother   . Sudden death Brother   . Heart attack Brother   . Heart attack Brother   . Arthritis Sister     ROS is negative for fevers, chills or night sweats. She denies chest pressure. She denies PND orthopnea. She denies wheezing. She denies significant swelling. She denies bleeding. She is unaware of arrhythmia. She believes her breast CA is stable she denies significant GERD symptoms.   Other system review is negative.  PE BP 132/76  Pulse 63  Ht 5' 0.5" (1.537 m)  Wt 142 lb 8 oz (64.638 kg)  BMI 27.36 kg/m2  General: Alert, oriented, no distress.  Skin: normal turgor, no rashes HEENT: Normocephalic, atraumatic. Pupils round and reactive; sclera anicteric;no lid lag.  Nose without nasal septal hypertrophy Mouth/Parynx benign; Mallinpatti scale 2 Neck: No JVD, no carotid briuts Lungs: clear to ausculatation and percussion; no wheezing or rales Heart: RRR, s1 s2 normal faint 1/6 sem Abdomen: soft, nontender; no hepatosplenomehaly, BS+; abdominal aorta nontender and not dilated by palpation. Pulses 2+ Extremities: no clubbing cyanosis or edema, Homan's sign negative  Neurologic: grossly nonfocal  ECG: Normal sinus rhythm at 64 beats per minute. No ectopy. No significant ST changes.  LABS:  BMET    Component Value Date/Time   NA 141 05/06/2013 1515   NA 141 04/03/2013 1410   K 4.2 05/06/2013 1515   K 3.6 04/03/2013 1410   CL 103 05/06/2013 1515   CL 105 04/03/2013 1410   CO2 33* 05/06/2013 1515   CO2 27 04/03/2013 1410   GLUCOSE 64* 05/06/2013 1515   GLUCOSE 113* 04/03/2013 1410   BUN 20 05/06/2013 1515   BUN 19.4 04/03/2013 1410   CREATININE  0.98 05/06/2013 1515   CREATININE 1.0 04/03/2013 1410   CREATININE 0.82 05/14/2012 1139   CALCIUM 9.2 05/06/2013 1515   CALCIUM 9.0 04/03/2013 1410   GFRNONAA 53* 09/09/2010 0916   GFRAA  Value: >60        The eGFR has been calculated using the MDRD equation. This calculation  has not been validated in all clinical situations. eGFR's persistently <60 mL/min signify possible Chronic Kidney Disease. 09/09/2010 0916     Hepatic Function Panel     Component Value Date/Time   PROT 6.5 04/03/2013 1410   PROT 5.7* 05/14/2012 1139   ALBUMIN 3.5 04/03/2013 1410   ALBUMIN 3.8 05/14/2012 1139   AST 21 04/03/2013 1410   AST 19 05/14/2012 1139   ALT 12 04/03/2013 1410   ALT 10 05/14/2012 1139   ALKPHOS 51 04/03/2013 1410   ALKPHOS 46 05/14/2012 1139   BILITOT 0.45 04/03/2013 1410   BILITOT 0.4 05/14/2012 1139   BILIDIR 0.2 11/19/2007 1030   IBILI 0.5 11/19/2007 1030     CBC    Component Value Date/Time   WBC 4.4 04/03/2013 1410   WBC 5.5 09/09/2010 0916   RBC 4.09 04/03/2013 1410   RBC 4.68 09/09/2010 0916   HGB 12.6 04/03/2013 1410   HGB 14.3 01/15/2011 1205   HCT 38.2 04/03/2013 1410   HCT 42.0 01/15/2011 1205   PLT 152 04/03/2013 1410   PLT 149* 09/09/2010 0916   MCV 93.4 04/03/2013 1410   MCV 93.4 09/09/2010 0916   MCH 30.8 04/03/2013 1410   MCH 31.4 09/09/2010 0916   MCHC 33.0 04/03/2013 1410   MCHC 33.6 09/09/2010 0916   RDW 12.8 04/03/2013 1410   RDW 13.1 09/09/2010 0916   LYMPHSABS 0.9 04/03/2013 1410   LYMPHSABS 1.5 09/09/2010 0916   MONOABS 0.3 04/03/2013 1410   MONOABS 0.5 09/09/2010 0916   EOSABS 0.2 04/03/2013 1410   EOSABS 0.5 09/09/2010 0916   BASOSABS 0.1 04/03/2013 1410   BASOSABS 0.1 09/09/2010 0916     BNP No results found for this basename: probnp    Lipid Panel     Component Value Date/Time   CHOL  Value: 129        ATP III CLASSIFICATION:  <200     mg/dL   Desirable  409-811  mg/dL   Borderline High  >=914    mg/dL   High 7/82/9562 1308   TRIG 78 11/20/2007 0505   HDL 62 11/20/2007 0505    CHOLHDL 2.1 11/20/2007 0505   VLDL 16 11/20/2007 0505   LDLCALC  Value: 51        Total Cholesterol/HDL:CHD Risk Coronary Heart Disease Risk Table                     Men   Women  1/2 Average Risk   3.4   3.3 11/20/2007 0505     RADIOLOGY: No results found.    ASSESSMENT AND PLAN: Gloria [name] is an 79 year [name] female with mild nonobstructive CAD. Her blood pressure is stable. She apparently never did take the amlodipine and has continued to take her losartan HCTZ. There is no edema presently. Her blood pressure is well-controlled. Emotionally Doppler studies were essentially normal without evidence for arterial insufficiency. Is no history to suggest DVT. Clinically she feels well. I recommended she continue her current therapy. I will see her in 6 months for cardiology reevaluation or sooner if problems arise.     Lennette Bihari, MD, [facility]  06/15/2013 9:43 AM

## 2013-06-24 DIAGNOSIS — Z79899 Other long term (current) drug therapy: Secondary | ICD-10-CM | POA: Diagnosis not present

## 2013-06-24 DIAGNOSIS — I129 Hypertensive chronic kidney disease with stage 1 through stage 4 chronic kidney disease, or unspecified chronic kidney disease: Secondary | ICD-10-CM | POA: Diagnosis not present

## 2013-06-24 DIAGNOSIS — M79609 Pain in unspecified limb: Secondary | ICD-10-CM | POA: Diagnosis not present

## 2013-07-02 ENCOUNTER — Other Ambulatory Visit: Payer: Self-pay | Admitting: *Deleted

## 2013-07-02 MED ORDER — LOSARTAN POTASSIUM-HCTZ 50-12.5 MG PO TABS: 1.0000 | ORAL_TABLET | Freq: Every day | ORAL | Status: DC

## 2013-07-02 NOTE — Telephone Encounter (Signed)
Rx was sent to pharmacy electronically. 

## 2013-08-07 DIAGNOSIS — Z23 Encounter for immunization: Secondary | ICD-10-CM | POA: Diagnosis not present

## 2013-09-04 DIAGNOSIS — L820 Inflamed seborrheic keratosis: Secondary | ICD-10-CM | POA: Diagnosis not present

## 2013-09-17 ENCOUNTER — Ambulatory Visit (INDEPENDENT_AMBULATORY_CARE_PROVIDER_SITE_OTHER): Payer: Medicare Other | Admitting: General Surgery

## 2013-09-17 ENCOUNTER — Encounter (INDEPENDENT_AMBULATORY_CARE_PROVIDER_SITE_OTHER): Payer: Self-pay | Admitting: General Surgery

## 2013-09-17 VITALS — BP 119/66 | HR 60 | Temp 97.1°F | Resp 14 | Ht 61.0 in | Wt 136.8 lb

## 2013-09-17 DIAGNOSIS — C50919 Malignant neoplasm of unspecified site of unspecified female breast: Secondary | ICD-10-CM | POA: Diagnosis not present

## 2013-09-17 DIAGNOSIS — C50912 Malignant neoplasm of unspecified site of left female breast: Secondary | ICD-10-CM

## 2013-09-18 ENCOUNTER — Other Ambulatory Visit (INDEPENDENT_AMBULATORY_CARE_PROVIDER_SITE_OTHER): Payer: Self-pay

## 2013-09-18 ENCOUNTER — Telehealth (INDEPENDENT_AMBULATORY_CARE_PROVIDER_SITE_OTHER): Payer: Self-pay | Admitting: *Deleted

## 2013-09-18 DIAGNOSIS — C50912 Malignant neoplasm of unspecified site of left female breast: Secondary | ICD-10-CM

## 2013-09-18 NOTE — Telephone Encounter (Signed)
I spoke with pt and informed her of her appt for her MM and [place] at the breast center on 10/03/13 with an arrival time of 2:45pm.  Instructed pt to take her insurance card and photo ID.  Pt is agreeable to the information provided.

## 2013-09-24 NOTE — Progress Notes (Signed)
Subjective:     Patient ID: Gloria [name], female   DOB: 1928-05-12, 79 y.o.   MRN: [id number]  HPI The patient is an 79 year old female who is 3 years status post left modified radical mastectomy for a T2 N1 left breast cancer. She has been taking tamoxifen and tolerates this well. She denies any chest wall pain.  Review of Systems  Constitutional: Negative.   HENT: Negative.   Eyes: Negative.   Respiratory: Negative.   Cardiovascular: Negative.   Gastrointestinal: Negative.   Endocrine: Negative.   Genitourinary: Negative.   Musculoskeletal: Negative.   Skin: Negative.   Allergic/Immunologic: Negative.   Neurological: Negative.   Hematological: Negative.   Psychiatric/Behavioral: Negative.        Objective:   Physical Exam  Constitutional: She is oriented to person, place, and time. She appears well-developed and well-nourished.  HENT:  Head: Normocephalic and atraumatic.  Eyes: Conjunctivae and EOM are normal. Pupils are equal, round, and reactive to light.  Neck: Normal range of motion. Neck supple.  Cardiovascular: Normal rate, regular rhythm and normal heart sounds.   Pulmonary/Chest: Effort normal and breath sounds normal.  There is a small round mobile nodule along the left mastectomy incision. There is no palpable mass of the right breast. There is no palpable axillary, supraclavicular, or cervical lymphadenopathy  Abdominal: Soft. Bowel sounds are normal. She exhibits no mass. There is no tenderness.  Musculoskeletal: Normal range of motion.  Lymphadenopathy:    She has no cervical adenopathy.  Neurological: She is alert and oriented to person, place, and time.  Skin: Skin is warm and dry.  Psychiatric: She has a normal mood and affect. Her behavior is normal.       Assessment:     The patient is 3 years status post left modified radical mastectomy for breast cancer. She has a new nodule along the left mastectomy incision.     Plan:     At this point she  will continue to take tamoxifen. She will continue to do regular self exams. I will have the nodule on the left chest wall evaluate her with ultrasound. If this appears to be benign I plan to see her back in 6 months.

## 2013-10-03 ENCOUNTER — Ambulatory Visit
Admission: RE | Admit: 2013-10-03 | Discharge: 2013-10-03 | Disposition: A | Discharge status: Home / Self Care | Payer: Medicare Other | Source: Ambulatory Visit | Attending: General Surgery | Admitting: General Surgery

## 2013-10-03 DIAGNOSIS — R928 Other abnormal and inconclusive findings on diagnostic imaging of breast: Secondary | ICD-10-CM | POA: Diagnosis not present

## 2013-10-03 DIAGNOSIS — C50912 Malignant neoplasm of unspecified site of left female breast: Secondary | ICD-10-CM

## 2013-10-16 ENCOUNTER — Telehealth: Payer: Self-pay | Admitting: *Deleted

## 2013-10-16 NOTE — Telephone Encounter (Signed)
sw pt made her aware that kk will be on call 11/20/13. gv appt for 11/20/13 w/labs@ 11:15am and ov@ 11:45am. Pt is aware that i will mail a letter/avs...td

## 2013-10-21 DIAGNOSIS — R300 Dysuria: Secondary | ICD-10-CM | POA: Diagnosis not present

## 2013-11-20 ENCOUNTER — Ambulatory Visit (HOSPITAL_BASED_OUTPATIENT_CLINIC_OR_DEPARTMENT_OTHER): Payer: Medicare Other | Admitting: Oncology

## 2013-11-20 ENCOUNTER — Other Ambulatory Visit (HOSPITAL_BASED_OUTPATIENT_CLINIC_OR_DEPARTMENT_OTHER): Payer: Medicare Other

## 2013-11-20 ENCOUNTER — Encounter (INDEPENDENT_AMBULATORY_CARE_PROVIDER_SITE_OTHER): Payer: Self-pay

## 2013-11-20 ENCOUNTER — Other Ambulatory Visit: Payer: Medicare Other

## 2013-11-20 ENCOUNTER — Ambulatory Visit: Payer: Medicare Other | Admitting: Oncology

## 2013-11-20 ENCOUNTER — Encounter: Payer: Self-pay | Admitting: Oncology

## 2013-11-20 VITALS — BP 117/76 | HR 80 | Temp 97.9°F | Resp 18 | Ht 61.0 in | Wt 134.3 lb

## 2013-11-20 DIAGNOSIS — I10 Essential (primary) hypertension: Secondary | ICD-10-CM | POA: Diagnosis not present

## 2013-11-20 DIAGNOSIS — C50119 Malignant neoplasm of central portion of unspecified female breast: Secondary | ICD-10-CM | POA: Diagnosis not present

## 2013-11-20 DIAGNOSIS — I2510 Atherosclerotic heart disease of native coronary artery without angina pectoris: Secondary | ICD-10-CM

## 2013-11-20 DIAGNOSIS — C50919 Malignant neoplasm of unspecified site of unspecified female breast: Secondary | ICD-10-CM

## 2013-11-20 DIAGNOSIS — C50912 Malignant neoplasm of unspecified site of left female breast: Secondary | ICD-10-CM

## 2013-11-20 LAB — CBC WITH DIFFERENTIAL/PLATELET
BASO%: 1 % (ref 0.0–2.0)
Basophils Absolute: 0 10*3/uL (ref 0.0–0.1)
EOS ABS: 0.3 10*3/uL (ref 0.0–0.5)
EOS%: 6.6 % (ref 0.0–7.0)
HCT: 39.2 % (ref 34.8–46.6)
HGB: 13 g/dL (ref 11.6–15.9)
LYMPH%: 21 % (ref 14.0–49.7)
MCH: 31 pg (ref 25.1–34.0)
MCHC: 33.2 g/dL (ref 31.5–36.0)
MCV: 93.6 fL (ref 79.5–101.0)
MONO#: 0.2 10*3/uL (ref 0.1–0.9)
MONO%: 6.3 % (ref 0.0–14.0)
NEUT%: 65.1 % (ref 38.4–76.8)
NEUTROS ABS: 2.5 10*3/uL (ref 1.5–6.5)
Platelets: 128 10*3/uL — ABNORMAL LOW (ref 145–400)
RBC: 4.19 10*6/uL (ref 3.70–5.45)
RDW: 13.1 % (ref 11.2–14.5)
WBC: 3.8 10*3/uL — AB (ref 3.9–10.3)
lymph#: 0.8 10*3/uL — ABNORMAL LOW (ref 0.9–3.3)

## 2013-11-20 LAB — COMPREHENSIVE METABOLIC PANEL (CC13)
ALBUMIN: 3.7 g/dL (ref 3.5–5.0)
ALK PHOS: 44 U/L (ref 40–150)
ALT: 13 U/L (ref 0–55)
AST: 20 U/L (ref 5–34)
Anion Gap: 9 mEq/L (ref 3–11)
BUN: 19.7 mg/dL (ref 7.0–26.0)
CO2: 28 mEq/L (ref 22–29)
Calcium: 9.3 mg/dL (ref 8.4–10.4)
Chloride: 106 mEq/L (ref 98–109)
Creatinine: 0.9 mg/dL (ref 0.6–1.1)
GLUCOSE: 104 mg/dL (ref 70–140)
POTASSIUM: 3.9 meq/L (ref 3.5–5.1)
SODIUM: 143 meq/L (ref 136–145)
TOTAL PROTEIN: 6.3 g/dL — AB (ref 6.4–8.3)
Total Bilirubin: 0.52 mg/dL (ref 0.20–1.20)

## 2013-11-20 NOTE — Progress Notes (Signed)
[place]  [phone] Fax:(336) [phone]  OFFICE PROGRESS NOTE  PATIENT: Gloria [name]   DOB: 1928/02/02  MR#: [id number]  EXB#:[id number]  NU:[id number],IHK [name], MD [name], MD [name]. [name], MD  DIAGNOSIS:  An 80 year old [place] woman with invasive lobular carcinoma of the left breast diagnosed in 07/2010.  PRIOR THERAPY: 1. The patient noted retraction of her nipple for 3 months prior on the left breast and underwent a mammogram on 08/03/2010 with an ultrasound. Exam showed left nipple retraction, and the palpable mass subareolar left breast at the 6:00 position. Ultrasound confirmed the presence of a mass measuring 2.6 x 1.7 x 2.4 cm.  2. A biopsy was performed on 08/04/2010 which path followed she showed to be an invasive mammary carcinoma with lobular features. ER 95%, PR 97%, Ki-67 17%, HER-2/neu was non amplified with a ratio of 1.  3. An MRI on 08/12/19,011 showed the mass to be larger at 4.6 x 4.2 x 2.3 cm with enhancement distortion extending to the nipple retraction. There is questionable anterior mediastinal lymph node seen.  4. Neoadjuvant antiestrogen therapy with Femara started in 07/2010.  5. Status post left breast mastectomy with sentinel node biopsy on 09/03/2010.  6. The patient was started on antiestrogen therapy with tamoxifen 08/2010.  7. Status post radiation therapy from 12/03/2010 through 01/23/2011.   CURRENT THERAPY:  Tamoxifen 20 mg by mouth daily since 08/2010.   INTERVAL HISTORY: Patient is seen in followup today. Her last visit was a few months ago when she complained of lower extremity pain. She had Doppler study performed and is negative for DVTs. I do think that it may be due to a Baker's cyst or something. She otherwise feels well no nausea or vomiting no fevers chills night sweats headaches no shortness of breath. I recommended doing exercises and stretching. We will see her back if it does not  improve.    PAST MEDICAL HISTORY: Past Medical History  Diagnosis Date  . Cancer     breast- left  . GERD (gastroesophageal reflux disease)   . Hypertension   . Anxiety   . Depression   . Blood in urine   . Knee fracture   . CAD (coronary artery disease)     non obstructive by cath  . TR (tricuspid regurgitation)     mild by Echo 12/2008 EF >55%    PAST SURGICAL HISTORY: Past Surgical History  Procedure Laterality Date  . Tonsillectomy    . Breast surgery  08-30-10    mastectomy  . Colon surgery    . Abdominal hysterectomy    . Cardiac catheterization  08/2011    20% LAD stenosis, 20% diagonal stenosis, 10-20% proximal dominant RCA stenosis    FAMILY HISTORY: Family History  Problem Relation Age of Onset  . Cancer Father     prostate  . Cancer Sister     breast  . Heart disease Sister   . Cancer Brother     pancreatic  . Cancer Maternal Grandmother     colon  . Dementia Mother   . Sudden death Brother   . Heart attack Brother   . Heart attack Brother   . Arthritis Sister     SOCIAL HISTORY: History  Substance Use Topics  . Smoking status: Former Smoker    Quit date: 11/15/1991  . Smokeless tobacco: Never Used  . Alcohol Use: 1.2 oz/week    2 Glasses of wine per week  Comment: 1 glass per week    ALLERGIES: Allergies  Allergen Reactions  . Contrast Media [Iodinated Diagnostic Agents] Anaphylaxis  . Adhesive [Tape] Other (See Comments)    blisters  . Cephalexin Swelling  . Ciprofloxacin Swelling  . Demerol Nausea And Vomiting  . Penicillins Swelling    REACTION: allergic to penicillin  . Sulfamethoxazole-Trimethoprim Itching and Swelling    REACTION: swelling/hives  . Quinolones Itching and Rash    REACTION: itching, rash Pt. Reports no problems with levaquin     MEDICATIONS:  Current Outpatient Prescriptions  Medication Sig Dispense Refill  . ALPRAZolam (XANAX) 0.25 MG tablet Take 0.25 mg by mouth at bedtime.       Marland Kitchen aspirin 81 MG  chewable tablet Chew 81 mg by mouth daily.        . Calcium Carbonate-Vitamin D (CALTRATE 600+D PO) Take by mouth once.      . cetirizine (ZYRTEC) 10 MG tablet Take 10 mg by mouth at bedtime as needed.       . doxepin (SINEQUAN) 10 MG capsule Take 10 mg by mouth at bedtime.       . Fluticasone Propionate (FLONASE NA) Place into the nose.      Marland Kitchen guaiFENesin (MUCINEX) 600 MG 12 hr tablet Take 1 to 2 every 12 hours as needed for cough/congestion/thick mucus      . losartan-hydrochlorothiazide (HYZAAR) 50-12.5 MG per tablet Take 1 tablet by mouth daily.  30 tablet  11  . MAGNESIUM PO Take by mouth.      . Omeprazole (PRILOSEC PO) Take 20 mg by mouth daily as needed.       . sodium chloride (OCEAN) 0.65 % nasal spray Place 2 sprays into the nose every 4 (four) hours as needed. Nasal congestion      . tamoxifen (NOLVADEX) 20 MG tablet Please give brand name; Not generic.  30 tablet  6   No current facility-administered medications for this visit.      REVIEW OF SYSTEMS: A 10 point review of systems was completed and is negative except as noted above.    PHYSICAL EXAMINATION: BP 117/76  Pulse 80  Temp(Src) 97.9 F (36.6 C) (Oral)  Resp 18  Ht 5' 1"  (1.549 m)  Wt 134 lb 4.8 oz (60.918 kg)  BMI 25.39 kg/m2   General appearance: Alert, cooperative, well nourished, no apparent distress Head: Normocephalic, without obvious abnormality, atraumatic, HOH Eyes: Arcus senilis, PERRLA, EOMI Nose: Nares, septum and mucosa are normal, no drainage, frontal and maxillary sinus tenderness Neck: No adenopathy, supple, symmetrical, trachea midline, thyroid not enlarged, no tenderness Resp: Clear to auscultation bilaterally Cardio: Regular rate and rhythm, S1, S2 normal, no murmur, click, rub or gallop Breasts: Left breast surgically absent, left axillary tenderness, well-healed surgical scar, no lymphadenopathy, no nipple inversion, no axilla fullness, benign breast exam GI: Soft, distended, non-tender,  hypoactive bowel sounds, no organomegaly Extremities: Extremities normal, atraumatic, no cyanosis or edema, left lower extremity tenderness Lymph nodes: Cervical, supraclavicular, and axillary nodes normal Neurologic: Grossly normal    ECOG FS:  Grade 1 - Symptomatic but completely ambulatory   LAB RESULTS: Lab Results  Component Value Date   WBC 3.8* 11/20/2013   NEUTROABS 2.5 11/20/2013   HGB 13.0 11/20/2013   HCT 39.2 11/20/2013   MCV 93.6 11/20/2013   PLT 128* 11/20/2013      Chemistry      Component Value Date/Time   NA 143 11/20/2013 1105   NA 141 05/06/2013 1515  K 3.9 11/20/2013 1105   K 4.2 05/06/2013 1515   CL 103 05/06/2013 1515   CL 105 04/03/2013 1410   CO2 28 11/20/2013 1105   CO2 33* 05/06/2013 1515   BUN 19.7 11/20/2013 1105   BUN 20 05/06/2013 1515   CREATININE 0.9 11/20/2013 1105   CREATININE 0.98 05/06/2013 1515   CREATININE 0.82 05/14/2012 1139      Component Value Date/Time   CALCIUM 9.3 11/20/2013 1105   CALCIUM 9.2 05/06/2013 1515   ALKPHOS 44 11/20/2013 1105   ALKPHOS 46 05/14/2012 1139   AST 20 11/20/2013 1105   AST 19 05/14/2012 1139   ALT 13 11/20/2013 1105   ALT 10 05/14/2012 1139   BILITOT 0.52 11/20/2013 1105   BILITOT 0.4 05/14/2012 1139       Lab Results  Component Value Date   LABCA2 29 08/13/2012    RADIOGRAPHIC STUDIES: No results found.  ASSESSMENT: 80 y.o. with:  1. Stage IIIB, T2 N1 invasive lobular carcinoma, grade 2, ER 95%, PR 97%, Ki-67 17%, HER-2/neu no amplification. Status post left breast mastectomy with sentinel biopsy on 09/03/2010. Status post radiation therapy that was completed on 01/23/2011. The patient states she declined chemotherapy. She was started on tamoxifen 20 mg daily. She remains on this. I will continue this for a total of 10 years  2. Patient has no evidence of recurrent disease.  3. Patient will be seen back in 6 months time for followup  All questions were answered.  The patient was encouraged to contact us in the  interim with any problems, questions or concerns.   Marcy Panning, MD Medical/Oncology [facility] [phone] (beeper) [phone] (Office)

## 2013-12-01 ENCOUNTER — Telehealth: Payer: Self-pay | Admitting: Oncology

## 2013-12-01 NOTE — Telephone Encounter (Signed)
, °

## 2013-12-08 ENCOUNTER — Ambulatory Visit (INDEPENDENT_AMBULATORY_CARE_PROVIDER_SITE_OTHER): Payer: Medicare Other | Admitting: Cardiovascular Disease

## 2013-12-08 ENCOUNTER — Encounter: Payer: Self-pay | Admitting: Cardiovascular Disease

## 2013-12-08 VITALS — BP 110/68 | HR 73 | Ht 60.0 in | Wt 135.3 lb

## 2013-12-08 DIAGNOSIS — K219 Gastro-esophageal reflux disease without esophagitis: Secondary | ICD-10-CM | POA: Diagnosis not present

## 2013-12-08 DIAGNOSIS — I2510 Atherosclerotic heart disease of native coronary artery without angina pectoris: Secondary | ICD-10-CM | POA: Diagnosis not present

## 2013-12-08 DIAGNOSIS — I10 Essential (primary) hypertension: Secondary | ICD-10-CM

## 2013-12-08 DIAGNOSIS — C50919 Malignant neoplasm of unspecified site of unspecified female breast: Secondary | ICD-10-CM | POA: Diagnosis not present

## 2013-12-08 NOTE — Patient Instructions (Signed)
Your physician recommends that you schedule a follow-up appointment in: 1 year  

## 2013-12-08 NOTE — Progress Notes (Signed)
Patient ID: KARSEN FELLOWS, female   DOB: [birth year], 80 y.o.   MRN: [id number]      HPI: NOAM FRANZEN is a 80 y.o. female who presents for 6 months cardiology followup evaluation.  Ms. Parzych is an 80 years old WF with documented mild nonobstructive CAD involving her LAD, diagonal and right coronary artery by catheterization which was last done by me on 09/08/2011. Additional problems include a history of hypertension,  breast CA and GERD.Marland Kitchen She status post left mastectomy and radiation therapy and had been on chronic tamoxifen. In the past, she did note some dizziness which she attributed to her tamoxifen. She had noticed some leg discomfort and  Doppler studies were normal. A 2-D echo Doppler study on 05/09/2013 showed an ejection fraction of 55-60% , mild tricuspid regurgitation, mild pulmonary hypertension with estimated PA pressure 34 mm. since I last saw her, she has been on losartan HCT 50/12.5 for blood pressure control. She denies recent leg swelling.  She states recently she was having some vaginal discharge and Dr. Floreen Comber has taken her off tamoxifen for the past month and she will be off this until May.  She denies palpitations. She denies chest pain, presyncope or syncope the  Past Medical History  Diagnosis Date  . Cancer     breast- left  . GERD (gastroesophageal reflux disease)   . Hypertension   . Anxiety   . Depression   . Blood in urine   . Knee fracture   . CAD (coronary artery disease)     non obstructive by cath  . TR (tricuspid regurgitation)     mild by Echo 12/2008 EF >55%    Past Surgical History  Procedure Laterality Date  . Tonsillectomy    . Breast surgery  08-30-10    mastectomy  . Colon surgery    . Abdominal hysterectomy    . Cardiac catheterization  08/2011    20% LAD stenosis, 20% diagonal stenosis, 10-20% proximal dominant RCA stenosis    Allergies  Allergen Reactions  . Contrast Media [Iodinated Diagnostic Agents] Anaphylaxis  . Adhesive  [Tape] Other (See Comments)    blisters  . Cephalexin Swelling  . Ciprofloxacin Swelling  . Demerol Nausea And Vomiting  . Penicillins Swelling    REACTION: allergic to penicillin  . Sulfamethoxazole-Trimethoprim Itching and Swelling    REACTION: swelling/hives  . Quinolones Itching and Rash    REACTION: itching, rash Pt. Reports no problems with levaquin    Current Outpatient Prescriptions  Medication Sig Dispense Refill  . ALPRAZolam (XANAX) 0.25 MG tablet Take 0.25 mg by mouth at bedtime.       Marland Kitchen aspirin 81 MG chewable tablet Chew 81 mg by mouth daily.        . Calcium Carbonate-Vitamin D (CALTRATE 600+D PO) Take by mouth once.      . cetirizine (ZYRTEC) 10 MG tablet Take 10 mg by mouth at bedtime as needed.       . doxepin (SINEQUAN) 10 MG capsule Take 10 mg by mouth at bedtime.       . Fluticasone Propionate (FLONASE NA) Place into the nose.      Marland Kitchen guaiFENesin (MUCINEX) 600 MG 12 hr tablet Take 1 to 2 every 12 hours as needed for cough/congestion/thick mucus      . losartan-hydrochlorothiazide (HYZAAR) 50-12.5 MG per tablet Take 1 tablet by mouth daily.  30 tablet  11  . MAGNESIUM PO Take by mouth.      Marland Kitchen  Omeprazole (PRILOSEC PO) Take 20 mg by mouth daily as needed.       . sodium chloride (OCEAN) 0.65 % nasal spray Place 2 sprays into the nose every 4 (four) hours as needed. Nasal congestion      . tamoxifen (NOLVADEX) 20 MG tablet Please give brand name; Not generic.  30 tablet  6   No current facility-administered medications for this visit.    History   Social History  . Marital Status: Divorced    Spouse Name: N/A    Number of Children: N/A  . Years of Education: N/A   Occupational History  . Not on file.   Social History Main Topics  . Smoking status: Former Smoker    Quit date: 11/15/1991  . Smokeless tobacco: Never Used  . Alcohol Use: 1.2 oz/week    2 Glasses of wine per week     Comment: 1 glass per week  . Drug Use: No  . Sexual Activity: Not  Currently   Other Topics Concern  . Not on file   Social History Narrative  . No narrative on file    Family History  Problem Relation Age of Onset  . Cancer Father     prostate  . Cancer Sister     breast  . Heart disease Sister   . Cancer Brother     pancreatic  . Cancer Maternal Grandmother     colon  . Dementia Mother   . Sudden death Brother   . Heart attack Brother   . Heart attack Brother   . Arthritis Sister     ROS is negative for fevers, chills or night sweats. She denies headaches. She has lost 7 pounds of weight over the past 6 months the She does were glasses. There are no changes in vision. She denies change in hearing. She is unaware of lymphadenopathy. She denies PND orthopnea. She denies wheezing. There are no palpitations. She denies chest pressure. Her appetite has been good. She denies nausea vomiting or diarrhea. She is unaware of bleeding but did note some vaginal discharge which has improved off tamoxifen. She denies significant swelling. She denies bleeding. Her yard he has been stable. There is no diabetes. She denies cold or heat intolerance. She denies difficulty with sleep. Other comprehensive 14 point system review is negative.   PE BP 110/68  Pulse 73  Ht 5' (1.524 m)  Wt 135 lb 4.8 oz (61.372 kg)  BMI 26.42 kg/m2  General: Alert, oriented, no distress.  Skin: normal turgor, no rashes HEENT: Normocephalic, atraumatic. Pupils round and reactive; sclera anicteric;no lid lag. No xanthelasmas Nose without nasal septal hypertrophy Mouth/Parynx benign; Mallinpatti scale 2 Neck: No JVD, no carotid bruits with normal carotid upstroke Lungs: clear to ausculatation and percussion; no wheezing or rales Chest wall: Nontender to palpation Heart: RRR, s1 s2 normal; 1/6 sem; no diastolic murmur, S3 or S4. No rubs thrills or heaves the Abdomen: soft, nontender; no hepatosplenomehaly, BS+; abdominal aorta nontender and not dilated by palpation. Back: No CVA  tenderness Pulses 2+ Extremities: no clubbing cyanosis or edema, Homan's sign negative  Neurologic: grossly nonfocal; Cranial nerves intact Psychological: Normal affect and mood  ECG (independently read by me): Normal sinus rhythm at 73 beats per minute. Nonspecific ST changes. No ectopy. Normal intervals.  Prior ECG of 06/03/2013: Normal sinus rhythm at 64 beats per minute. No ectopy. No significant ST changes.  LABS:  BMET    Component Value Date/Time   NA 143  11/20/2013 1105   NA 141 05/06/2013 1515   K 3.9 11/20/2013 1105   K 4.2 05/06/2013 1515   CL 103 05/06/2013 1515   CL 105 04/03/2013 1410   CO2 28 11/20/2013 1105   CO2 33* 05/06/2013 1515   GLUCOSE 104 11/20/2013 1105   GLUCOSE 64* 05/06/2013 1515   GLUCOSE 113* 04/03/2013 1410   BUN 19.7 11/20/2013 1105   BUN 20 05/06/2013 1515   CREATININE 0.9 11/20/2013 1105   CREATININE 0.98 05/06/2013 1515   CREATININE 0.82 05/14/2012 1139   CALCIUM 9.3 11/20/2013 1105   CALCIUM 9.2 05/06/2013 1515   GFRNONAA 53* 09/09/2010 0916   GFRAA  Value: >60        The eGFR has been calculated using the MDRD equation. This calculation has not been validated in all clinical situations. eGFR's persistently <60 mL/min signify possible Chronic Kidney Disease. 09/09/2010 0916     Hepatic Function Panel     Component Value Date/Time   PROT 6.3* 11/20/2013 1105   PROT 5.7* 05/14/2012 1139   ALBUMIN 3.7 11/20/2013 1105   ALBUMIN 3.8 05/14/2012 1139   AST 20 11/20/2013 1105   AST 19 05/14/2012 1139   ALT 13 11/20/2013 1105   ALT 10 05/14/2012 1139   ALKPHOS 44 11/20/2013 1105   ALKPHOS 46 05/14/2012 1139   BILITOT 0.52 11/20/2013 1105   BILITOT 0.4 05/14/2012 1139   BILIDIR 0.2 11/19/2007 1030   IBILI 0.5 11/19/2007 1030     CBC    Component Value Date/Time   WBC 3.8* 11/20/2013 1105   WBC 5.5 09/09/2010 0916   RBC 4.19 11/20/2013 1105   RBC 4.68 09/09/2010 0916   HGB 13.0 11/20/2013 1105   HGB 14.3 01/15/2011 1205   HCT 39.2 11/20/2013 1105   HCT 42.0 01/15/2011  1205   PLT 128* 11/20/2013 1105   PLT 149* 09/09/2010 0916   MCV 93.6 11/20/2013 1105   MCV 93.4 09/09/2010 0916   MCH 31.0 11/20/2013 1105   MCH 31.4 09/09/2010 0916   MCHC 33.2 11/20/2013 1105   MCHC 33.6 09/09/2010 0916   RDW 13.1 11/20/2013 1105   RDW 13.1 09/09/2010 0916   LYMPHSABS 0.8* 11/20/2013 1105   LYMPHSABS 1.5 09/09/2010 0916   MONOABS 0.2 11/20/2013 1105   MONOABS 0.5 09/09/2010 0916   EOSABS 0.3 11/20/2013 1105   EOSABS 0.5 09/09/2010 0916   BASOSABS 0.0 11/20/2013 1105   BASOSABS 0.1 09/09/2010 0916     BNP No results found for this basename: probnp    Lipid Panel     Component Value Date/Time   CHOL  Value: 129        ATP III CLASSIFICATION:  <200     mg/dL   Desirable  200-239  mg/dL   Borderline High  >=240    mg/dL   High 11/20/2007 0505   TRIG 78 11/20/2007 0505   HDL 62 11/20/2007 0505   CHOLHDL 2.1 11/20/2007 0505   VLDL 16 11/20/2007 0505   LDLCALC  Value: 51        Total Cholesterol/HDL:CHD Risk Coronary Heart Disease Risk Table                     Men   Women  1/2 Average Risk   3.4   3.3 11/20/2007 0505     RADIOLOGY: No results found.    ASSESSMENT AND PLAN: Ms. Wayne Sever is an 80 year old female with documented mild nonobstructive CAD by cardiac catheterization on 09/08/2011. Presently,  her blood pressure is stable on losartan HCTZ 50/12.5 daily. She's not having any edema. She's not having palpitations. Her heart rhythm is stable. Previous lower extremity Doppler studies were normal. She tells me she is followed every several months recently by Dr. Chancy Milroy and also sees Dr. Felipa Eth twice a year. For a cardiac standpoint, she remains stable. I will see her in one year for followup evaluation.   Troy Sine, MD, [facility] ([facility])  12/08/2013 10:46 AM

## 2013-12-24 DIAGNOSIS — M899 Disorder of bone, unspecified: Secondary | ICD-10-CM | POA: Diagnosis not present

## 2013-12-24 DIAGNOSIS — Z1331 Encounter for screening for depression: Secondary | ICD-10-CM | POA: Diagnosis not present

## 2013-12-24 DIAGNOSIS — I129 Hypertensive chronic kidney disease with stage 1 through stage 4 chronic kidney disease, or unspecified chronic kidney disease: Secondary | ICD-10-CM | POA: Diagnosis not present

## 2013-12-24 DIAGNOSIS — Z0000 Encounter for general adult medical examination without abnormal findings: Secondary | ICD-10-CM | POA: Diagnosis not present

## 2013-12-24 DIAGNOSIS — N183 Chronic kidney disease, stage 3 unspecified: Secondary | ICD-10-CM | POA: Diagnosis not present

## 2013-12-24 DIAGNOSIS — Z23 Encounter for immunization: Secondary | ICD-10-CM | POA: Diagnosis not present

## 2013-12-24 DIAGNOSIS — Z79899 Other long term (current) drug therapy: Secondary | ICD-10-CM | POA: Diagnosis not present

## 2013-12-24 DIAGNOSIS — L989 Disorder of the skin and subcutaneous tissue, unspecified: Secondary | ICD-10-CM | POA: Diagnosis not present

## 2013-12-24 DIAGNOSIS — H6120 Impacted cerumen, unspecified ear: Secondary | ICD-10-CM | POA: Diagnosis not present

## 2013-12-31 DIAGNOSIS — N950 Postmenopausal bleeding: Secondary | ICD-10-CM | POA: Diagnosis not present

## 2013-12-31 DIAGNOSIS — N898 Other specified noninflammatory disorders of vagina: Secondary | ICD-10-CM | POA: Diagnosis not present

## 2013-12-31 DIAGNOSIS — N368 Other specified disorders of urethra: Secondary | ICD-10-CM | POA: Diagnosis not present

## 2014-01-12 ENCOUNTER — Other Ambulatory Visit: Payer: Self-pay | Admitting: *Deleted

## 2014-01-12 DIAGNOSIS — C50919 Malignant neoplasm of unspecified site of unspecified female breast: Secondary | ICD-10-CM

## 2014-01-12 MED ORDER — TAMOXIFEN CITRATE 20 MG PO TABS: 20.0000 mg | ORAL_TABLET | Freq: Every day | ORAL | Status: DC

## 2014-02-19 DIAGNOSIS — R51 Headache: Secondary | ICD-10-CM | POA: Diagnosis not present

## 2014-02-19 DIAGNOSIS — H9209 Otalgia, unspecified ear: Secondary | ICD-10-CM | POA: Diagnosis not present

## 2014-02-23 DIAGNOSIS — J209 Acute bronchitis, unspecified: Secondary | ICD-10-CM | POA: Diagnosis not present

## 2014-02-23 DIAGNOSIS — R05 Cough: Secondary | ICD-10-CM | POA: Diagnosis not present

## 2014-02-23 DIAGNOSIS — R059 Cough, unspecified: Secondary | ICD-10-CM | POA: Diagnosis not present

## 2014-02-23 DIAGNOSIS — R0982 Postnasal drip: Secondary | ICD-10-CM | POA: Diagnosis not present

## 2014-02-24 ENCOUNTER — Telehealth: Payer: Self-pay | Admitting: Oncology

## 2014-02-24 NOTE — Telephone Encounter (Signed)
kk out - pt to [facility] 5/6. lmonvm for pt and mailed schedule.

## 2014-02-27 ENCOUNTER — Encounter (INDEPENDENT_AMBULATORY_CARE_PROVIDER_SITE_OTHER): Payer: Self-pay | Admitting: General Surgery

## 2014-02-28 DIAGNOSIS — J209 Acute bronchitis, unspecified: Secondary | ICD-10-CM | POA: Diagnosis not present

## 2014-03-02 ENCOUNTER — Telehealth: Payer: Self-pay

## 2014-03-02 ENCOUNTER — Other Ambulatory Visit: Payer: Medicare Other

## 2014-03-02 ENCOUNTER — Ambulatory Visit: Payer: Medicare Other | Admitting: Oncology

## 2014-03-02 NOTE — Telephone Encounter (Signed)
Returned pt call to confirm d/t of next appt.  Pt voiced understanding.

## 2014-03-04 ENCOUNTER — Ambulatory Visit (HOSPITAL_BASED_OUTPATIENT_CLINIC_OR_DEPARTMENT_OTHER): Payer: Medicare Other | Admitting: Oncology

## 2014-03-04 ENCOUNTER — Other Ambulatory Visit: Payer: Self-pay | Admitting: *Deleted

## 2014-03-04 ENCOUNTER — Other Ambulatory Visit (HOSPITAL_BASED_OUTPATIENT_CLINIC_OR_DEPARTMENT_OTHER): Payer: Medicare Other

## 2014-03-04 ENCOUNTER — Encounter: Payer: Self-pay | Admitting: Oncology

## 2014-03-04 VITALS — BP 135/77 | HR 87 | Temp 97.7°F | Resp 18 | Ht 60.0 in | Wt 137.1 lb

## 2014-03-04 DIAGNOSIS — C50919 Malignant neoplasm of unspecified site of unspecified female breast: Secondary | ICD-10-CM

## 2014-03-04 DIAGNOSIS — C50019 Malignant neoplasm of nipple and areola, unspecified female breast: Secondary | ICD-10-CM

## 2014-03-04 DIAGNOSIS — J069 Acute upper respiratory infection, unspecified: Secondary | ICD-10-CM

## 2014-03-04 DIAGNOSIS — R059 Cough, unspecified: Secondary | ICD-10-CM | POA: Diagnosis not present

## 2014-03-04 DIAGNOSIS — R05 Cough: Secondary | ICD-10-CM

## 2014-03-04 DIAGNOSIS — Z170 Estrogen receptor positive status [ER+]: Secondary | ICD-10-CM

## 2014-03-04 LAB — CBC WITH DIFFERENTIAL/PLATELET
BASO%: 0.9 % (ref 0.0–2.0)
Basophils Absolute: 0 10*3/uL (ref 0.0–0.1)
EOS%: 9.8 % — AB (ref 0.0–7.0)
Eosinophils Absolute: 0.5 10*3/uL (ref 0.0–0.5)
HCT: 38.3 % (ref 34.8–46.6)
HGB: 12.7 g/dL (ref 11.6–15.9)
LYMPH%: 23.5 % (ref 14.0–49.7)
MCH: 31.2 pg (ref 25.1–34.0)
MCHC: 33.2 g/dL (ref 31.5–36.0)
MCV: 93.9 fL (ref 79.5–101.0)
MONO#: 0.5 10*3/uL (ref 0.1–0.9)
MONO%: 10 % (ref 0.0–14.0)
NEUT#: 2.7 10*3/uL (ref 1.5–6.5)
NEUT%: 55.8 % (ref 38.4–76.8)
PLATELETS: 181 10*3/uL (ref 145–400)
RBC: 4.08 10*6/uL (ref 3.70–5.45)
RDW: 13.2 % (ref 11.2–14.5)
WBC: 4.8 10*3/uL (ref 3.9–10.3)
lymph#: 1.1 10*3/uL (ref 0.9–3.3)

## 2014-03-04 LAB — COMPREHENSIVE METABOLIC PANEL (CC13)
ALK PHOS: 62 U/L (ref 40–150)
ALT: 18 U/L (ref 0–55)
AST: 19 U/L (ref 5–34)
Albumin: 3.2 g/dL — ABNORMAL LOW (ref 3.5–5.0)
Anion Gap: 7 mEq/L (ref 3–11)
BILIRUBIN TOTAL: 0.39 mg/dL (ref 0.20–1.20)
BUN: 18.6 mg/dL (ref 7.0–26.0)
CO2: 27 mEq/L (ref 22–29)
CREATININE: 1 mg/dL (ref 0.6–1.1)
Calcium: 9.1 mg/dL (ref 8.4–10.4)
Chloride: 108 mEq/L (ref 98–109)
Glucose: 72 mg/dl (ref 70–140)
Potassium: 4.1 mEq/L (ref 3.5–5.1)
SODIUM: 142 meq/L (ref 136–145)
TOTAL PROTEIN: 5.8 g/dL — AB (ref 6.4–8.3)

## 2014-03-04 MED ORDER — TAMOXIFEN CITRATE 20 MG PO TABS: 20.0000 mg | ORAL_TABLET | Freq: Every day | ORAL | Status: DC

## 2014-03-04 MED ORDER — BENZONATATE 100 MG PO CAPS: 100.0000 mg | ORAL_CAPSULE | Freq: Three times a day (TID) | ORAL | Status: DC | PRN

## 2014-03-04 NOTE — Patient Instructions (Addendum)
You may use Zyrtec (can use generic) 10 mg daily. Use Saline nasal spray 3-4 times a day as needed to hydrate your sinuses.  You may continue Mucinex twice a day.  I have sent a prescription for Tessalon pearles to your pharmacy. Continue to use your Albuterol every 6 hours.

## 2014-03-04 NOTE — Telephone Encounter (Signed)
Facsimile received from [place] that Nolvadex is no longer made.  Verbal order received from Erasmo Downer to ask if she has ever been on generic tamoxifen and fill.  [facility], spoke with [place] who reports generic is what has been filled previously.    Mortimer Fries given order to fill with generic Tamoxifen as previously filled.

## 2014-03-04 NOTE — Progress Notes (Signed)
[facility]  [phone] [phone]  OFFICE PROGRESS NOTE  PATIENT: Gloria [name]   DOB: [birth year]  MR#: [id number]  LRJ#:[id number]  LM:RAJHHIDUP,BDH [name], MD [name], MD [name]. [name], MD  DIAGNOSIS:  An 80 year old [place] woman with invasive lobular carcinoma of the left breast diagnosed in 07/2010.  PRIOR THERAPY: 1. The patient noted retraction of her nipple for 3 months prior on the left breast and underwent a mammogram on 08/03/2010 with an ultrasound. Exam showed left nipple retraction, and the palpable mass subareolar left breast at the 6:00 position. Ultrasound confirmed the presence of a mass measuring 2.6 x 1.7 x 2.4 cm.  2. A biopsy was performed on 08/04/2010 which path followed she showed to be an invasive mammary carcinoma with lobular features. ER 95%, PR 97%, Ki-67 17%, HER-2/neu was non amplified with a ratio of 1.  3. An MRI on 08/12/19,011 showed the mass to be larger at 4.6 x 4.2 x 2.3 cm with enhancement distortion extending to the nipple retraction. There is questionable anterior mediastinal lymph node seen.  4. Neoadjuvant antiestrogen therapy with Femara started in 07/2010.  5. Status post left breast mastectomy with sentinel node biopsy on 09/03/2010.  6. The patient was started on antiestrogen therapy with tamoxifen 08/2010.  7. Status post radiation therapy from 12/03/2010 through 01/23/2011.   CURRENT THERAPY:  Tamoxifen 20 mg by mouth daily since 08/2010.   INTERVAL HISTORY: Patient is seen in followup today. Her last visit was a few months. She states that at that visit she was having a large amount of yellow drainage from her vagina that was blood-tinged at times. She has been seen by gynecology and reports that all cultures were negative. The drainage continued, but got significantly better within the past one to 2 months when she was treated with multiple antibiotics for upper rest for his symptoms.  She has had no further vaginal bleeding. Today she expresses ongoing concern about her upper respiratory symptoms as well as ongoing cough. She remains on clarithromycin. She denies fevers. She reports that the cough is her worse symptom. She also reports intermittent epistaxis and has been seen by ENT, but no further recommendations were made. She has stopped her Flonase on her own. She takes albuterol 2-3 times per day. She was seen most recently a walk-in clinic this past weekend and no additional interventions were given. With regards to her breast cancer, she has no new lumps. She is tolerating the tamoxifen well. She otherwise feels well no nausea or vomiting no fevers chills night sweats headaches no shortness of breath.   PAST MEDICAL HISTORY: Past Medical History  Diagnosis Date  . Cancer     breast- left  . GERD (gastroesophageal reflux disease)   . Hypertension   . Anxiety   . Depression   . Blood in urine   . Knee fracture   . CAD (coronary artery disease)     non obstructive by cath  . TR (tricuspid regurgitation)     mild by Echo 12/2008 EF >55%    PAST SURGICAL HISTORY: Past Surgical History  Procedure Laterality Date  . Tonsillectomy    . Breast surgery  08-30-10    mastectomy  . Colon surgery    . Abdominal hysterectomy    . Cardiac catheterization  08/2011    20% LAD stenosis, 20% diagonal stenosis, 10-20% proximal dominant RCA stenosis    FAMILY HISTORY: Family History  Problem Relation Age of  Onset  . Cancer Father     prostate  . Cancer Sister     breast  . Heart disease Sister   . Cancer Brother     pancreatic  . Cancer Maternal Grandmother     colon  . Dementia Mother   . Sudden death Brother   . Heart attack Brother   . Heart attack Brother   . Arthritis Sister     SOCIAL HISTORY: History  Substance Use Topics  . Smoking status: Former Smoker    Quit date: 11/15/1991  . Smokeless tobacco: Never Used  . Alcohol Use: 1.2 oz/week    2  Glasses of wine per week     Comment: 1 glass per week    ALLERGIES: Allergies  Allergen Reactions  . Contrast Media [Iodinated Diagnostic Agents] Anaphylaxis  . Adhesive [Tape] Other (See Comments)    blisters  . Cephalexin Swelling  . Ciprofloxacin Swelling  . Demerol Nausea And Vomiting  . Penicillins Swelling    REACTION: allergic to penicillin  . Sulfamethoxazole-Trimethoprim Itching and Swelling    REACTION: swelling/hives  . Quinolones Itching and Rash    REACTION: itching, rash Pt. Reports no problems with levaquin     MEDICATIONS:  Current Outpatient Prescriptions  Medication Sig Dispense Refill  . albuterol (PROVENTIL HFA;VENTOLIN HFA) 108 (90 BASE) MCG/ACT inhaler Inhale 2 puffs into the lungs every 4 (four) hours as needed for wheezing or shortness of breath.      . ALPRAZolam (XANAX) 0.25 MG tablet Take 0.25 mg by mouth at bedtime.       Marland Kitchen aspirin 81 MG chewable tablet Chew 81 mg by mouth daily.        . Calcium Carbonate-Vitamin D (CALTRATE 600+D PO) Take by mouth once.      . cetirizine (ZYRTEC) 10 MG tablet Take 10 mg by mouth at bedtime as needed.       . clarithromycin (BIAXIN) 500 MG tablet Take 500 mg by mouth 2 (two) times daily.      Marland Kitchen doxepin (SINEQUAN) 10 MG capsule Take 10 mg by mouth at bedtime.       Marland Kitchen guaiFENesin (MUCINEX) 600 MG 12 hr tablet Take 1 to 2 every 12 hours as needed for cough/congestion/thick mucus      . losartan-hydrochlorothiazide (HYZAAR) 50-12.5 MG per tablet Take 1 tablet by mouth daily.  30 tablet  11  . MAGNESIUM PO Take by mouth.      . Omeprazole (PRILOSEC PO) Take 20 mg by mouth daily as needed.       . predniSONE (DELTASONE) 10 MG tablet Take 10 mg by mouth daily with breakfast.      . sodium chloride (OCEAN) 0.65 % nasal spray Place 2 sprays into the nose every 4 (four) hours as needed. Nasal congestion      . benzonatate (TESSALON) 100 MG capsule Take 1 capsule (100 mg total) by mouth 3 (three) times daily as needed for  cough.  20 capsule  0  . Fluticasone Propionate (FLONASE NA) Place into the nose.      . tamoxifen (NOLVADEX) 20 MG tablet Take 1 tablet (20 mg total) by mouth daily.  30 tablet  5   No current facility-administered medications for this visit.      REVIEW OF SYSTEMS: A 10 point review of systems was completed and is negative except as noted above.    PHYSICAL EXAMINATION: BP 135/77  Pulse 87  Temp(Src) 97.7 F (36.5 C) (Oral)  Resp 18  Ht 5' (1.524 m)  Wt 137 lb 1.6 oz (62.188 kg)  BMI 26.78 kg/m2  SpO2 98%   General appearance: Alert, cooperative, well nourished, no apparent distress Head: Normocephalic, without obvious abnormality, atraumatic, HOH Eyes: Arcus senilis, PERRLA, EOMI Nose: Nares, septum and mucosa are normal, no drainage, frontal and maxillary sinus tenderness Neck: No adenopathy, supple, symmetrical, trachea midline, thyroid not enlarged, no tenderness Resp: Clear to auscultation bilaterally Cardio: Regular rate and rhythm, S1, S2 normal, no murmur, click, rub or gallop Breasts: Left breast surgically absent, left axillary tenderness, well-healed surgical scar, no lymphadenopathy, no nipple inversion, no axilla fullness, benign breast exam GI: Soft, distended, non-tender, hypoactive bowel sounds, no organomegaly Extremities: Extremities normal, atraumatic, no cyanosis or edema, left lower extremity tenderness Lymph nodes: Cervical, supraclavicular, and axillary nodes normal Neurologic: Grossly normal    ECOG FS:  Grade 1 - Symptomatic but completely ambulatory   LAB RESULTS: Lab Results  Component Value Date   WBC 4.8 03/04/2014   NEUTROABS 2.7 03/04/2014   HGB 12.7 03/04/2014   HCT 38.3 03/04/2014   MCV 93.9 03/04/2014   PLT 181 03/04/2014      Chemistry      Component Value Date/Time   NA 142 03/04/2014 1000   NA 141 05/06/2013 1515   K 4.1 03/04/2014 1000   K 4.2 05/06/2013 1515   CL 103 05/06/2013 1515   CL 105 04/03/2013 1410   CO2 27 03/04/2014 1000   CO2 33*  05/06/2013 1515   BUN 18.6 03/04/2014 1000   BUN 20 05/06/2013 1515   CREATININE 1.0 03/04/2014 1000   CREATININE 0.98 05/06/2013 1515   CREATININE 0.82 05/14/2012 1139      Component Value Date/Time   CALCIUM 9.1 03/04/2014 1000   CALCIUM 9.2 05/06/2013 1515   ALKPHOS 62 03/04/2014 1000   ALKPHOS 46 05/14/2012 1139   AST 19 03/04/2014 1000   AST 19 05/14/2012 1139   ALT 18 03/04/2014 1000   ALT 10 05/14/2012 1139   BILITOT 0.39 03/04/2014 1000   BILITOT 0.4 05/14/2012 1139       Lab Results  Component Value Date   LABCA2 29 08/13/2012    RADIOGRAPHIC STUDIES: No results found.  ASSESSMENT/PLAN: 80 y.o. with:  1. Stage IIIB, T2 N1 invasive lobular carcinoma, grade 2, ER 95%, PR 97%, Ki-67 17%, HER-2/neu no amplification. Status post left breast mastectomy with sentinel biopsy on 09/03/2010. Status post radiation therapy that was completed on 01/23/2011. The patient states she declined chemotherapy. She was started on tamoxifen 20 mg daily. She remains on this. I will continue this for a total of 10 years. Her most recent mammogram and ultrasound were performed in December 2014. She had a small area at the left mastectomy site which is likely a cyst or fat necrosis. They have recommended a short term left breast ultrasound in approximately 6 months which I have requested today.  2. upper respiratory infection. The patient has received multiple antibiotics over the past several months. She has an ongoing cough. She is afebrile and her lungs are clear. Her WBC and neutrophils are normal. However, her eosinophils are slightly elevated. I have encouraged her to resume her Zyrtec 10 mg daily. She may continue Mucinex DMS user he been taking. I've also encouraged her to use her albuterol every 6 hours. She may use saline nasal spray for her epistaxis and followup with ENT if this is an ongoing issue. I have given her 20 tablets of  Tessalon Perles to help with her cough. She was instructed to follow up with her  primary care provider if her cough and other symptoms not resolve within the next week.  3. Patient will be seen back in 6 months time for followup  All questions were answered.  The patient was encouraged to contact us in the interim with any problems, questions or concerns.   Mikey Bussing, DNP, AGPCNP-BC

## 2014-03-05 ENCOUNTER — Other Ambulatory Visit: Payer: Self-pay | Admitting: Oncology

## 2014-03-05 DIAGNOSIS — Z853 Personal history of malignant neoplasm of breast: Secondary | ICD-10-CM

## 2014-03-05 DIAGNOSIS — M7989 Other specified soft tissue disorders: Secondary | ICD-10-CM

## 2014-03-05 DIAGNOSIS — Z9012 Acquired absence of left breast and nipple: Secondary | ICD-10-CM

## 2014-03-09 DIAGNOSIS — H35369 Drusen (degenerative) of macula, unspecified eye: Secondary | ICD-10-CM | POA: Diagnosis not present

## 2014-03-09 DIAGNOSIS — H353190 Nonexudative age-related macular degeneration, unspecified eye, stage unspecified: Secondary | ICD-10-CM | POA: Diagnosis not present

## 2014-03-11 ENCOUNTER — Telehealth: Payer: Self-pay | Admitting: Adult Health

## 2014-03-11 NOTE — Telephone Encounter (Signed)
, °

## 2014-03-15 ENCOUNTER — Inpatient Hospital Stay (HOSPITAL_COMMUNITY)
Admission: EM | Admit: 2014-03-15 | Discharge: 2014-03-17 | DRG: 192 | Disposition: A | Discharge status: Home / Self Care | Payer: Medicare Other | Attending: Internal Medicine | Admitting: Internal Medicine

## 2014-03-15 ENCOUNTER — Emergency Department (HOSPITAL_COMMUNITY): Payer: Medicare Other

## 2014-03-15 ENCOUNTER — Encounter (HOSPITAL_COMMUNITY): Payer: Self-pay | Admitting: Emergency Medicine

## 2014-03-15 DIAGNOSIS — R05 Cough: Secondary | ICD-10-CM

## 2014-03-15 DIAGNOSIS — J441 Chronic obstructive pulmonary disease with (acute) exacerbation: Secondary | ICD-10-CM | POA: Diagnosis present

## 2014-03-15 DIAGNOSIS — D72819 Decreased white blood cell count, unspecified: Secondary | ICD-10-CM | POA: Diagnosis present

## 2014-03-15 DIAGNOSIS — Z853 Personal history of malignant neoplasm of breast: Secondary | ICD-10-CM | POA: Diagnosis not present

## 2014-03-15 DIAGNOSIS — D696 Thrombocytopenia, unspecified: Secondary | ICD-10-CM | POA: Diagnosis present

## 2014-03-15 DIAGNOSIS — I079 Rheumatic tricuspid valve disease, unspecified: Secondary | ICD-10-CM | POA: Diagnosis present

## 2014-03-15 DIAGNOSIS — J440 Chronic obstructive pulmonary disease with acute lower respiratory infection: Secondary | ICD-10-CM

## 2014-03-15 DIAGNOSIS — K219 Gastro-esophageal reflux disease without esophagitis: Secondary | ICD-10-CM | POA: Diagnosis present

## 2014-03-15 DIAGNOSIS — C50919 Malignant neoplasm of unspecified site of unspecified female breast: Secondary | ICD-10-CM | POA: Diagnosis not present

## 2014-03-15 DIAGNOSIS — R000 Tachycardia, unspecified: Secondary | ICD-10-CM | POA: Diagnosis not present

## 2014-03-15 DIAGNOSIS — R059 Cough, unspecified: Secondary | ICD-10-CM

## 2014-03-15 DIAGNOSIS — J45909 Unspecified asthma, uncomplicated: Secondary | ICD-10-CM | POA: Diagnosis not present

## 2014-03-15 DIAGNOSIS — J209 Acute bronchitis, unspecified: Secondary | ICD-10-CM | POA: Diagnosis present

## 2014-03-15 DIAGNOSIS — Z87891 Personal history of nicotine dependence: Secondary | ICD-10-CM

## 2014-03-15 DIAGNOSIS — J45901 Unspecified asthma with (acute) exacerbation: Principal | ICD-10-CM

## 2014-03-15 DIAGNOSIS — Z7982 Long term (current) use of aspirin: Secondary | ICD-10-CM | POA: Diagnosis not present

## 2014-03-15 DIAGNOSIS — J40 Bronchitis, not specified as acute or chronic: Secondary | ICD-10-CM | POA: Diagnosis present

## 2014-03-15 DIAGNOSIS — I2510 Atherosclerotic heart disease of native coronary artery without angina pectoris: Secondary | ICD-10-CM

## 2014-03-15 DIAGNOSIS — I959 Hypotension, unspecified: Secondary | ICD-10-CM

## 2014-03-15 DIAGNOSIS — Z79899 Other long term (current) drug therapy: Secondary | ICD-10-CM

## 2014-03-15 DIAGNOSIS — I10 Essential (primary) hypertension: Secondary | ICD-10-CM | POA: Diagnosis present

## 2014-03-15 DIAGNOSIS — R1319 Other dysphagia: Secondary | ICD-10-CM

## 2014-03-15 DIAGNOSIS — K649 Unspecified hemorrhoids: Secondary | ICD-10-CM

## 2014-03-15 LAB — CBC WITH DIFFERENTIAL/PLATELET
BASOS ABS: 0 10*3/uL (ref 0.0–0.1)
BASOS PCT: 1 % (ref 0–1)
EOS ABS: 0.4 10*3/uL (ref 0.0–0.7)
Eosinophils Relative: 12 % — ABNORMAL HIGH (ref 0–5)
HEMATOCRIT: 37.4 % (ref 36.0–46.0)
HEMOGLOBIN: 12.6 g/dL (ref 12.0–15.0)
LYMPHS ABS: 0.8 10*3/uL (ref 0.7–4.0)
Lymphocytes Relative: 22 % (ref 12–46)
MCH: 31.5 pg (ref 26.0–34.0)
MCHC: 33.7 g/dL (ref 30.0–36.0)
MCV: 93.5 fL (ref 78.0–100.0)
MONOS PCT: 10 % (ref 3–12)
Monocytes Absolute: 0.4 10*3/uL (ref 0.1–1.0)
NEUTROS ABS: 2.1 10*3/uL (ref 1.7–7.7)
Neutrophils Relative %: 55 % (ref 43–77)
Platelets: 116 10*3/uL — ABNORMAL LOW (ref 150–400)
RBC: 4 MIL/uL (ref 3.87–5.11)
RDW: 12.8 % (ref 11.5–15.5)
WBC: 3.7 10*3/uL — ABNORMAL LOW (ref 4.0–10.5)

## 2014-03-15 LAB — URINALYSIS, ROUTINE W REFLEX MICROSCOPIC
BILIRUBIN URINE: NEGATIVE
Glucose, UA: NEGATIVE mg/dL
KETONES UR: NEGATIVE mg/dL
NITRITE: NEGATIVE
PH: 5.5 (ref 5.0–8.0)
Protein, ur: NEGATIVE mg/dL
SPECIFIC GRAVITY, URINE: 1.008 (ref 1.005–1.030)
Urobilinogen, UA: 0.2 mg/dL (ref 0.0–1.0)

## 2014-03-15 LAB — D-DIMER, QUANTITATIVE: D-Dimer, Quant: 0.73 ug/mL-FEU — ABNORMAL HIGH (ref 0.00–0.48)

## 2014-03-15 LAB — BASIC METABOLIC PANEL
BUN: 19 mg/dL (ref 6–23)
CHLORIDE: 104 meq/L (ref 96–112)
CO2: 27 mEq/L (ref 19–32)
CREATININE: 0.86 mg/dL (ref 0.50–1.10)
Calcium: 9.1 mg/dL (ref 8.4–10.5)
GFR calc non Af Amer: 60 mL/min — ABNORMAL LOW (ref >90)
GFR, EST AFRICAN AMERICAN: 70 mL/min — AB (ref >90)
GLUCOSE: 92 mg/dL (ref 70–99)
POTASSIUM: 3.7 meq/L (ref 3.7–5.3)
Sodium: 141 mEq/L (ref 137–147)

## 2014-03-15 LAB — URINE MICROSCOPIC-ADD ON

## 2014-03-15 LAB — PRO B NATRIURETIC PEPTIDE: Pro B Natriuretic peptide (BNP): 135.5 pg/mL (ref 0–450)

## 2014-03-15 MED ORDER — SODIUM CHLORIDE 0.9 % IV SOLN
INTRAVENOUS | Status: DC
  Administered 2014-03-15: 23:00:00 via INTRAVENOUS
  Administered 2014-03-16: 1000 mL via INTRAVENOUS

## 2014-03-15 MED ORDER — SODIUM CHLORIDE 0.9 % IV SOLN: INTRAVENOUS | Status: DC

## 2014-03-15 MED ORDER — HYDROCODONE-ACETAMINOPHEN 5-325 MG PO TABS: 1.0000 | ORAL_TABLET | ORAL | Status: DC | PRN

## 2014-03-15 MED ORDER — KETOROLAC TROMETHAMINE 30 MG/ML IJ SOLN
30.0000 mg | Freq: Once | INTRAMUSCULAR | Status: AC
  Administered 2014-03-15: 30 mg via INTRAVENOUS
  Filled 2014-03-15: qty 1

## 2014-03-15 MED ORDER — ENOXAPARIN SODIUM 30 MG/0.3ML ~~LOC~~ SOLN
30.0000 mg | Freq: Every day | SUBCUTANEOUS | Status: DC
  Administered 2014-03-16 (×2): 30 mg via SUBCUTANEOUS
  Filled 2014-03-15 (×3): qty 0.3

## 2014-03-15 MED ORDER — LEVOFLOXACIN IN D5W 250 MG/50ML IV SOLN
250.0000 mg | Freq: Every day | INTRAVENOUS | Status: DC
  Administered 2014-03-16 (×2): 250 mg via INTRAVENOUS
  Filled 2014-03-15 (×3): qty 50

## 2014-03-15 MED ORDER — LOSARTAN POTASSIUM 50 MG PO TABS
50.0000 mg | ORAL_TABLET | Freq: Every day | ORAL | Status: DC
  Administered 2014-03-16 – 2014-03-17 (×2): 50 mg via ORAL
  Filled 2014-03-15 (×2): qty 1

## 2014-03-15 MED ORDER — ALBUTEROL SULFATE HFA 108 (90 BASE) MCG/ACT IN AERS: 2.0000 | INHALATION_SPRAY | RESPIRATORY_TRACT | Status: DC | PRN

## 2014-03-15 MED ORDER — SODIUM CHLORIDE 0.9 % IV BOLUS (SEPSIS)
500.0000 mL | Freq: Once | INTRAVENOUS | Status: AC
  Administered 2014-03-15: 500 mL via INTRAVENOUS

## 2014-03-15 MED ORDER — SODIUM CHLORIDE 0.9 % IV SOLN
INTRAVENOUS | Status: DC
  Administered 2014-03-15: 20:00:00 via INTRAVENOUS

## 2014-03-15 MED ORDER — DOXEPIN HCL 10 MG PO CAPS
10.0000 mg | ORAL_CAPSULE | Freq: Every day | ORAL | Status: DC
  Administered 2014-03-16 (×2): 10 mg via ORAL
  Filled 2014-03-15 (×3): qty 1

## 2014-03-15 MED ORDER — TAMOXIFEN CITRATE 10 MG PO TABS
20.0000 mg | ORAL_TABLET | Freq: Every day | ORAL | Status: DC
  Administered 2014-03-16 – 2014-03-17 (×2): 20 mg via ORAL
  Filled 2014-03-15 (×3): qty 2

## 2014-03-15 MED ORDER — LORATADINE 10 MG PO TABS
10.0000 mg | ORAL_TABLET | Freq: Every day | ORAL | Status: DC
  Administered 2014-03-16 – 2014-03-17 (×2): 10 mg via ORAL
  Filled 2014-03-15 (×2): qty 1

## 2014-03-15 MED ORDER — ACETAMINOPHEN 650 MG RE SUPP: 650.0000 mg | Freq: Four times a day (QID) | RECTAL | Status: DC | PRN

## 2014-03-15 MED ORDER — GUAIFENESIN-DM 100-10 MG/5ML PO SYRP: 5.0000 mL | ORAL_SOLUTION | ORAL | Status: DC | PRN

## 2014-03-15 MED ORDER — ALPRAZOLAM 0.25 MG PO TABS
0.2500 mg | ORAL_TABLET | Freq: Every day | ORAL | Status: DC
  Administered 2014-03-16 (×2): 0.25 mg via ORAL
  Filled 2014-03-15 (×2): qty 1

## 2014-03-15 MED ORDER — GUAIFENESIN ER 600 MG PO TB12
1200.0000 mg | ORAL_TABLET | Freq: Two times a day (BID) | ORAL | Status: DC
  Administered 2014-03-16 (×3): 1200 mg via ORAL
  Administered 2014-03-17: 600 mg via ORAL
  Filled 2014-03-15 (×5): qty 2

## 2014-03-15 MED ORDER — ALBUTEROL SULFATE (2.5 MG/3ML) 0.083% IN NEBU: 2.5000 mg | INHALATION_SOLUTION | RESPIRATORY_TRACT | Status: DC | PRN

## 2014-03-15 MED ORDER — PREDNISONE 20 MG PO TABS
60.0000 mg | ORAL_TABLET | Freq: Once | ORAL | Status: AC
  Administered 2014-03-15: 60 mg via ORAL
  Filled 2014-03-15: qty 3

## 2014-03-15 MED ORDER — ONDANSETRON HCL 4 MG PO TABS: 4.0000 mg | ORAL_TABLET | Freq: Four times a day (QID) | ORAL | Status: DC | PRN

## 2014-03-15 MED ORDER — ONDANSETRON HCL 4 MG/2ML IJ SOLN
4.0000 mg | Freq: Once | INTRAMUSCULAR | Status: AC
  Administered 2014-03-15: 4 mg via INTRAVENOUS
  Filled 2014-03-15: qty 2

## 2014-03-15 MED ORDER — FLUTICASONE PROPIONATE 50 MCG/ACT NA SUSP
1.0000 | Freq: Every day | NASAL | Status: DC
  Administered 2014-03-16 – 2014-03-17 (×2): 1 via NASAL
  Filled 2014-03-15: qty 16

## 2014-03-15 MED ORDER — ACETAMINOPHEN 325 MG PO TABS: 650.0000 mg | ORAL_TABLET | Freq: Four times a day (QID) | ORAL | Status: DC | PRN

## 2014-03-15 MED ORDER — ALBUTEROL (5 MG/ML) CONTINUOUS INHALATION SOLN
15.0000 mg/h | INHALATION_SOLUTION | Freq: Once | RESPIRATORY_TRACT | Status: AC
  Administered 2014-03-15: 15 mg/h via RESPIRATORY_TRACT
  Filled 2014-03-15: qty 20

## 2014-03-15 MED ORDER — ALBUTEROL SULFATE (2.5 MG/3ML) 0.083% IN NEBU: 2.5000 mg | INHALATION_SOLUTION | Freq: Four times a day (QID) | RESPIRATORY_TRACT | Status: DC

## 2014-03-15 MED ORDER — ONDANSETRON HCL 4 MG/2ML IJ SOLN: 4.0000 mg | Freq: Four times a day (QID) | INTRAMUSCULAR | Status: DC | PRN

## 2014-03-15 MED ORDER — METHYLPREDNISOLONE SODIUM SUCC 125 MG IJ SOLR
60.0000 mg | Freq: Two times a day (BID) | INTRAMUSCULAR | Status: DC
  Administered 2014-03-16 (×2): 60 mg via INTRAVENOUS
  Filled 2014-03-15 (×3): qty 0.96

## 2014-03-15 MED ORDER — ASPIRIN 81 MG PO CHEW
81.0000 mg | CHEWABLE_TABLET | Freq: Every day | ORAL | Status: DC
  Administered 2014-03-16 – 2014-03-17 (×2): 81 mg via ORAL
  Filled 2014-03-15 (×2): qty 1

## 2014-03-15 MED ORDER — IPRATROPIUM BROMIDE 0.02 % IN SOLN: 0.5000 mg | Freq: Four times a day (QID) | RESPIRATORY_TRACT | Status: DC

## 2014-03-15 MED ORDER — PANTOPRAZOLE SODIUM 20 MG PO TBEC
20.0000 mg | DELAYED_RELEASE_TABLET | Freq: Two times a day (BID) | ORAL | Status: DC
  Administered 2014-03-16 – 2014-03-17 (×4): 20 mg via ORAL
  Filled 2014-03-15 (×5): qty 1

## 2014-03-15 NOTE — ED Notes (Signed)
Patient c/o of chest tightness at this time. EDP notified and 12 lead EKG done and give to provider.

## 2014-03-15 NOTE — ED Provider Notes (Signed)
CSN: [id number]     Arrival date & time 03/15/14  1446 History   First MD Initiated Contact with Patient 03/15/14 1505     Chief Complaint  Patient presents with  . Cough     (Consider location/radiation/quality/duration/timing/severity/associated sxs/prior Treatment) Patient is a 80 y.o. female presenting with cough. The history is provided by the patient and a relative.  Cough  She presents with complaint of persistent cough for 6 weeks. She has received numerous medications. In succession, she has received azithromycin, clarithromycin, albuterol, prednisone, Mucinex DM. She has been seen by primary care, cardiology, and oncology, all during this time. She continues to have cough, productive of green and yellow sputum. The cough is worse at night, when she lies down. She does not have fever, persistent, chest pain, abdominal pain, back pain, weakness, or dizziness. She has chest soreness that occurs when she is coughing, and moving around. She's never had this previously. She does not have chronic lung disease.she is taking her other medications, as prescribed. There are no other known modifying factors.  Past Medical History  Diagnosis Date  . Cancer     breast- left  . GERD (gastroesophageal reflux disease)   . Hypertension   . Anxiety   . Depression   . Blood in urine   . Knee fracture   . CAD (coronary artery disease)     non obstructive by cath  . TR (tricuspid regurgitation)     mild by Echo 12/2008 EF >55%   Past Surgical History  Procedure Laterality Date  . Tonsillectomy    . Breast surgery  08-30-10    mastectomy  . Colon surgery    . Abdominal hysterectomy    . Cardiac catheterization  08/2011    20% LAD stenosis, 20% diagonal stenosis, 10-20% proximal dominant RCA stenosis   Family History  Problem Relation Age of Onset  . Cancer Father     prostate  . Cancer Sister     breast  . Heart disease Sister   . Cancer Brother     pancreatic  . Cancer Maternal  Grandmother     colon  . Dementia Mother   . Sudden death Brother   . Heart attack Brother   . Heart attack Brother   . Arthritis Sister    History  Substance Use Topics  . Smoking status: Former Smoker    Quit date: 11/15/1991  . Smokeless tobacco: Never Used  . Alcohol Use: 1.2 oz/week    2 Glasses of wine per week     Comment: 1 glass per week   OB History   Grav Para Term Preterm Abortions TAB SAB Ect Mult Living                 Review of Systems  Respiratory: Positive for cough.   All other systems reviewed and are negative.     Allergies  Contrast media; Adhesive; Cephalexin; Ciprofloxacin; Demerol; Penicillins; Sulfamethoxazole-trimethoprim; and Quinolones  Home Medications   Prior to Admission medications   Medication Sig Start Date End Date Taking? Authorizing Provider  albuterol (PROVENTIL HFA;VENTOLIN HFA) 108 (90 BASE) MCG/ACT inhaler Inhale 2 puffs into the lungs every 4 (four) hours as needed for wheezing or shortness of breath.   Yes Historical Provider, MD  ALPRAZolam (XANAX) 0.25 MG tablet Take 0.25 mg by mouth at bedtime.    Yes Historical Provider, MD  aspirin 81 MG chewable tablet Chew 81 mg by mouth daily.     Yes  Historical Provider, MD  benzonatate (TESSALON) 100 MG capsule Take 1 capsule (100 mg total) by mouth 3 (three) times daily as needed for cough. 03/04/14  Yes Maryanna Shape, NP  Calcium Carbonate-Vitamin D (CALTRATE 600+D PO) Take 1 tablet by mouth daily.    Yes Historical Provider, MD  cetirizine (ZYRTEC) 10 MG tablet Take 10 mg by mouth daily.    Yes Historical Provider, MD  doxepin (SINEQUAN) 10 MG capsule Take 10 mg by mouth at bedtime.    Yes Historical Provider, MD  fluticasone (FLONASE) 50 MCG/ACT nasal spray Place 1 spray into both nostrils daily.   Yes Historical Provider, MD  guaiFENesin (MUCINEX) 600 MG 12 hr tablet Take 1 to 2 every 12 hours as needed for cough/congestion/thick mucus   Yes Historical Provider, MD  losartan  (COZAAR) 50 MG tablet Take 50 mg by mouth daily.   Yes Historical Provider, MD  MAGNESIUM PO Take 250 mg by mouth daily.    Yes Historical Provider, MD  tamoxifen (NOLVADEX) 20 MG tablet Take 1 tablet (20 mg total) by mouth daily. 03/04/14  Yes Maryanna Shape, NP   BP 101/50  Pulse 114  Temp(Src) 97.6 F (36.4 C) (Oral)  Resp 20  Ht 5' (1.524 m)  Wt 136 lb 8 oz (61.916 kg)  BMI 26.66 kg/m2  SpO2 95% Physical Exam  Nursing note and vitals reviewed. Constitutional: She is oriented to person, place, and time. She appears well-developed.  Elderly, frail  HENT:  Head: Normocephalic and atraumatic.  Eyes: Conjunctivae and EOM are normal. Pupils are equal, round, and reactive to light.  Neck: Normal range of motion and phonation normal. Neck supple.  Cardiovascular: Normal rate, regular rhythm and intact distal pulses.   Pulmonary/Chest: Effort normal. No respiratory distress. She has wheezes (generalized anteriorly and posteriorly, and expiratory.). She has no rales. She exhibits no tenderness.  Scattered rhonchi  Abdominal: Soft. She exhibits no distension. There is no tenderness. There is no guarding.  Musculoskeletal: Normal range of motion. She exhibits no edema and no tenderness.  Neurological: She is alert and oriented to person, place, and time. She exhibits normal muscle tone.  Skin: Skin is warm and dry.  Psychiatric: She has a normal mood and affect. Her behavior is normal. Judgment and thought content normal.    ED Course  Procedures (including critical care time)  Medications  doxepin (SINEQUAN) capsule 10 mg (not administered)  ALPRAZolam (XANAX) tablet 0.25 mg (not administered)  loratadine (CLARITIN) tablet 10 mg (not administered)  aspirin chewable tablet 81 mg (not administered)  tamoxifen (NOLVADEX) tablet 20 mg (not administered)  losartan (COZAAR) tablet 50 mg (not administered)  fluticasone (FLONASE) 50 MCG/ACT nasal spray 1 spray (not administered)   enoxaparin (LOVENOX) injection 30 mg (not administered)  acetaminophen (TYLENOL) tablet 650 mg (not administered)    Or  acetaminophen (TYLENOL) suppository 650 mg (not administered)  HYDROcodone-acetaminophen (NORCO/VICODIN) 5-325 MG per tablet 1-2 tablet (not administered)  ondansetron (ZOFRAN) tablet 4 mg (not administered)    Or  ondansetron (ZOFRAN) injection 4 mg (not administered)  albuterol (PROVENTIL) (2.5 MG/3ML) 0.083% nebulizer solution 2.5 mg (not administered)  albuterol (PROVENTIL) (2.5 MG/3ML) 0.083% nebulizer solution 2.5 mg (not administered)  ipratropium (ATROVENT) nebulizer solution 0.5 mg (not administered)  guaiFENesin (MUCINEX) 12 hr tablet 1,200 mg (not administered)  guaiFENesin-dextromethorphan (ROBITUSSIN DM) 100-10 MG/5ML syrup 5 mL (not administered)  Levofloxacin (LEVAQUIN) IVPB 250 mg (not administered)  methylPREDNISolone sodium succinate (SOLU-MEDROL) 125 mg/2 mL injection 60 mg (  not administered)  pantoprazole (PROTONIX) EC tablet 20 mg (not administered)  0.9 %  sodium chloride infusion ( Intravenous New Bag/Given 03/15/14 2324)  albuterol (PROVENTIL,VENTOLIN) solution continuous neb (15 mg/hr Nebulization Given 03/15/14 1631)  predniSONE (DELTASONE) tablet 60 mg (60 mg Oral Given 03/15/14 1629)  ondansetron (ZOFRAN) injection 4 mg (4 mg Intravenous Given 03/15/14 1833)  sodium chloride 0.9 % bolus 500 mL (0 mLs Intravenous Stopped 03/15/14 2002)  ketorolac (TORADOL) 30 MG/ML injection 30 mg (30 mg Intravenous Given 03/15/14 1911)    Patient Vitals for the past 24 hrs:  BP Temp Temp src Pulse Resp SpO2 Height Weight  03/15/14 2319 101/50 mmHg 97.6 F (36.4 C) Oral 114 20 95 % 5' (1.524 m) 136 lb 8 oz (61.916 kg)  03/15/14 2311 - - - - - - 5' (1.524 m) 137 lb (62.143 kg)  03/15/14 2200 103/43 mmHg - - 111 15 97 % - -  03/15/14 2113 99/37 mmHg 98.1 F (36.7 C) Oral 112 18 96 % - -  03/15/14 2000 102/47 mmHg - - 112 20 95 % - -  03/15/14 1949 97/56 mmHg -  - 117 20 94 % - -  03/15/14 1837 116/56 mmHg - - 116 14 94 % - -  03/15/14 1631 - - - - - 99 % - -  03/15/14 1617 - 99 F (37.2 C) Rectal - - - - -  03/15/14 1614 - 99 F (37.2 C) Rectal - - - - -  03/15/14 1459 136/70 mmHg 98.3 F (36.8 C) Oral 92 20 98 % - -    6:55 PM Reevaluation with update and discussion. After initial assessment and treatment, an updated evaluation reveals- Lungs with improved air mvt. And no wheezing; she has mild chest tightness.  she would like pain meds and wait to see if she improves enough to go home. Richarda Blade    8:55 PM-Consult complete with Hospitalist. Patient case explained and discussed. He agrees to admit patient for further evaluation and treatment. Call ended at 2100  CRITICAL CARE Performed by: Richarda Blade Total critical care time: 35 minutes Critical care time was exclusive of separately billable procedures and treating other patients. Critical care was necessary to treat or prevent imminent or life-threatening deterioration. Critical care was time spent personally by me on the following activities: development of treatment plan with patient and/or surrogate as well as nursing, discussions with consultants, evaluation of patient's response to treatment, examination of patient, obtaining history from patient or surrogate, ordering and performing treatments and interventions, ordering and review of laboratory studies, ordering and review of radiographic studies, pulse oximetry and re-evaluation of patient's condition.  Labs Review Labs Reviewed  CBC WITH DIFFERENTIAL - Abnormal; Notable for the following:    WBC 3.7 (*)    Platelets 116 (*)    Eosinophils Relative 12 (*)    All other components within normal limits  BASIC METABOLIC PANEL - Abnormal; Notable for the following:    GFR calc non Af Amer 60 (*)    GFR calc Af Amer 70 (*)    All other components within normal limits  URINALYSIS, ROUTINE W REFLEX MICROSCOPIC - Abnormal;  Notable for the following:    Hgb urine dipstick TRACE (*)    Leukocytes, UA SMALL (*)    All other components within normal limits  D-DIMER, QUANTITATIVE - Abnormal; Notable for the following:    D-Dimer, Quant 0.73 (*)    All other components within normal limits  URINE CULTURE  PRO B NATRIURETIC PEPTIDE  URINE MICROSCOPIC-ADD ON    Imaging Review Dg Chest 2 View  03/15/2014   CLINICAL DATA:  Productive cough  EXAM: CHEST  2 VIEW  COMPARISON:  DG CHEST 2 VIEW dated 03/26/2013  FINDINGS: Evidence of left mastectomy. The lungs are clear. Minimal biapical pleural thickening noted. No pleural effusion. Heart size is normal. The aorta is unfolded and ectatic. Mild mid thoracic kyphosis is reidentified.  IMPRESSION: No acute cardiopulmonary process.   Electronically Signed   By: Conchita Paris M.D.   On: 03/15/2014 15:39     EKG Interpretation   Date/Time:  Sunday Mar 15 2014 18:14:49 EDT Ventricular Rate:  130 PR Interval:  127 QRS Duration: 83 QT Interval:  326 QTC Calculation: 479 R Axis:   14 Text Interpretation:  Sinus tachycardia Abnormal R-wave progression, early  transition Repolarization abnormality, prob rate related Since last  tracing rate faster Confirmed by Latysha Thackston  MD, Arelyn Gauer ([id number]) on 03/15/2014  9:43:48 PM         MDM   Final diagnoses:  Bronchitis with bronchospasm  Tachycardia  Hypotension    Evaluation is consistent with bronchitis, with bronchospasm. This is a prolonged process that has been treated extensively. In the ED here today. She is better with a continuous nebulizer, but has a residual tachycardia with mild hypotension. This  has not improved, with IV fluids. She has a normal age adjusted D-dimer. I doubt PE. I doubt ACS.  Nursing Notes Reviewed/ Care Coordinated Applicable Imaging Reviewed Interpretation of Laboratory Data incorporated into ED treatment   Plan: Admit    Richarda Blade, MD 03/15/14 2340

## 2014-03-15 NOTE — ED Notes (Signed)
Pt. C/o persistent, productive cough x 6 weeks.  This persists in spite of Azithromycin and albuterol inhaler prescribed by her pcp.

## 2014-03-15 NOTE — H&P (Signed)
[facility]                                                                                    Patient Demographics  Gloria [name], is a 80 y.o. female  CSN: [id number]  MRN: [id number]  DOB - Oct 10, 1928  Admit Date - 03/15/2014  Outpatient Primary MD for the patient is [name], MD   With History of -  Past Medical History  Diagnosis Date  . Cancer     breast- left  . GERD (gastroesophageal reflux disease)   . Hypertension   . Anxiety   . Depression   . Blood in urine   . Knee fracture   . CAD (coronary artery disease)     non obstructive by cath  . TR (tricuspid regurgitation)     mild by Echo 12/2008 EF >55%      Past Surgical History  Procedure Laterality Date  . Tonsillectomy    . Breast surgery  08-30-10    mastectomy  . Colon surgery    . Abdominal hysterectomy    . Cardiac catheterization  08/2011    20% LAD stenosis, 20% diagonal stenosis, 10-20% proximal dominant RCA stenosis    in for   Chief Complaint  Patient presents with  . Cough     HPI  Shaolin Armas  is a 80 y.o. female, with history of asthma and GERD presenting with a 4 weeks history of cough, productive with yellowish/greenish sputum in the morning that clears up by the afternoon, no fever but complains of sweating at times, no nausea or vomiting. The patient received Zithromax, Biaxin, steroids, and Tessalon Perles to no avail. She presented here after seeing multiple physicians with no improvement. Her rhonchi and symptoms improved with breathing treatment in the emergency room and the patient is being admitted for further treatment.    Review of Systems    In addition to the HPI above,  No Headache, No changes with Vision or hearing, No problems swallowing food or Liquids, No Chest pain,  No Abdominal pain, No Nausea or Vommitting, Bowel movements are regular, No Blood in stool or Urine, No dysuria, No new skin rashes or bruises, No new joints pains-aches,   No new weakness, tingling, numbness in any extremity, No recent weight gain or loss, No polyuria, polydypsia or polyphagia, No significant Mental Stressors.  A full 10 point Review of Systems was done, except as stated above, all other Review of Systems were negative.   Social History History  Substance Use Topics  . Smoking status: Former Smoker    Quit date: 11/15/1991  . Smokeless tobacco: Never Used  . Alcohol Use: 1.2 oz/week    2 Glasses of wine per week     Comment: 1 glass per week     Family History Family History  Problem Relation Age of Onset  . Cancer Father     prostate  . Cancer Sister     breast  . Heart disease Sister   . Cancer Brother     pancreatic  . Cancer Maternal Grandmother     colon  . Dementia Mother   . Sudden death  Brother   . Heart attack Brother   . Heart attack Brother   . Arthritis Sister      Prior to Admission medications   Medication Sig Start Date End Date Taking? Authorizing Provider  albuterol (PROVENTIL HFA;VENTOLIN HFA) 108 (90 BASE) MCG/ACT inhaler Inhale 2 puffs into the lungs every 4 (four) hours as needed for wheezing or shortness of breath.   Yes Historical Provider, MD  ALPRAZolam (XANAX) 0.25 MG tablet Take 0.25 mg by mouth at bedtime.    Yes Historical Provider, MD  aspirin 81 MG chewable tablet Chew 81 mg by mouth daily.     Yes Historical Provider, MD  benzonatate (TESSALON) 100 MG capsule Take 1 capsule (100 mg total) by mouth 3 (three) times daily as needed for cough. 03/04/14  Yes Maryanna Shape, NP  Calcium Carbonate-Vitamin D (CALTRATE 600+D PO) Take 1 tablet by mouth daily.    Yes Historical Provider, MD  cetirizine (ZYRTEC) 10 MG tablet Take 10 mg by mouth daily.    Yes Historical Provider, MD  doxepin (SINEQUAN) 10 MG capsule Take 10 mg by mouth at bedtime.    Yes Historical Provider, MD  fluticasone (FLONASE) 50 MCG/ACT nasal spray Place 1 spray into both nostrils daily.   Yes Historical Provider, MD   guaiFENesin (MUCINEX) 600 MG 12 hr tablet Take 1 to 2 every 12 hours as needed for cough/congestion/thick mucus   Yes Historical Provider, MD  losartan (COZAAR) 50 MG tablet Take 50 mg by mouth daily.   Yes Historical Provider, MD  MAGNESIUM PO Take 250 mg by mouth daily.    Yes Historical Provider, MD  tamoxifen (NOLVADEX) 20 MG tablet Take 1 tablet (20 mg total) by mouth daily. 03/04/14  Yes Maryanna Shape, NP    Allergies  Allergen Reactions  . Contrast Media [Iodinated Diagnostic Agents] Anaphylaxis  . Adhesive [Tape] Other (See Comments)    blisters  . Cephalexin Swelling  . Ciprofloxacin Swelling  . Demerol Nausea And Vomiting  . Penicillins Swelling    REACTION: allergic to penicillin  . Sulfamethoxazole-Trimethoprim Itching and Swelling    REACTION: swelling/hives  . Quinolones Itching and Rash    REACTION: itching, rash Pt. Reports no problems with levaquin    Physical Exam  Vitals  Blood pressure 103/43, pulse 111, temperature 98.1 F (36.7 C), temperature source Oral, resp. rate 15, SpO2 97.00%.   1. General elderly white American female, looks tired  2. Normal affect and insight, Not Suicidal or Homicidal, Awake Alert, Oriented X 3.  3. No F.N deficits, ALL C.Nerves Intact, Strength 5/5 all 4 extremities, Sensation intact all 4 extremities, Plantars down going.  4. Ears and Eyes appear Normal, Conjunctivae clear, PERRLA. Moist Oral Mucosa.  5. Supple Neck, No JVD, No cervical lymphadenopathy appriciated, No Carotid Bruits.  6. Symmetrical Chest wall movement, mild scattered rhonchi.  7. RRR, No Gallops, systolic ejection murmur radiating to the neck, No Parasternal Heave.  8. Positive Bowel Sounds, Abdomen Soft, Non tender, No organomegaly appriciated,No rebound -guarding or rigidity.  9.  No Cyanosis, Normal Skin Turgor, No Skin Rash or Bruise.  10. Good muscle tone,  joints appear normal , no effusions, Normal ROM.  11. No Palpable Lymph Nodes in  Neck or Axillae    Data Review  CBC  Recent Labs Lab 03/15/14 1548  WBC 3.7*  HGB 12.6  HCT 37.4  PLT 116*  MCV 93.5  MCH 31.5  MCHC 33.7  RDW 12.8  LYMPHSABS 0.8  MONOABS 0.4  EOSABS 0.4  BASOSABS 0.0   ------------------------------------------------------------------------------------------------------------------  Chemistries   Recent Labs Lab 03/15/14 1548  NA 141  K 3.7  CL 104  CO2 27  GLUCOSE 92  BUN 19  CREATININE 0.86  CALCIUM 9.1   ------------------------------------------------------------------------------------------------------------------ CrCl is unknown because both a height and weight (above a minimum accepted value) are required for this calculation. ------------------------------------------------------------------------------------------------------------------ No results found for this basename: TSH, T4TOTAL, FREET3, T3FREE, THYROIDAB,  in the last 72 hours   Coagulation profile No results found for this basename: INR, PROTIME,  in the last 168 hours -------------------------------------------------------------------------------------------------------------------  Recent Labs  03/15/14 1615  DDIMER 0.73*   -------------------------------------------------------------------------------------------------------------------  Cardiac Enzymes No results found for this basename: CK, CKMB, TROPONINI, MYOGLOBIN,  in the last 168 hours ------------------------------------------------------------------------------------------------------------------ No components found with this basename: POCBNP,    ---------------------------------------------------------------------------------------------------------------  Urinalysis    Component Value Date/Time   COLORURINE YELLOW 03/15/2014 [place] 03/15/2014 1619   LABSPEC 1.008 03/15/2014 1619   PHURINE 5.5 03/15/2014 1619   [place] 03/15/2014 1619   HGBUR TRACE*  03/15/2014 1619   BILIRUBINUR NEGATIVE 03/15/2014 1619   [place] 03/15/2014 1619   PROTEINUR NEGATIVE 03/15/2014 1619   UROBILINOGEN 0.2 03/15/2014 1619   NITRITE NEGATIVE 03/15/2014 1619   LEUKOCYTESUR SMALL* 03/15/2014 1619    ----------------------------------------------------------------------------------------------------------------   Imaging results:   Dg Chest 2 View  03/15/2014   CLINICAL DATA:  Productive cough  EXAM: CHEST  2 VIEW  COMPARISON:  DG CHEST 2 VIEW dated 03/26/2013  FINDINGS: Evidence of left mastectomy. The lungs are clear. Minimal biapical pleural thickening noted. No pleural effusion. Heart size is normal. The aorta is unfolded and ectatic. Mild mid thoracic kyphosis is reidentified.  IMPRESSION: No acute cardiopulmonary process.   Electronically Signed   By: Conchita Paris M.D.   On: 03/15/2014 15:39    My personal review of EKG:   Assessment & Plan  1. acute Bronchitis with chronic cough , relentless 2. Asthma with exacerbation 3. Leukopenia /thrombocytopenia-monitor 4. History of breast cancer  Admit to MedSurg IV steroids Continue nebulizer treatment Cough suppressants IV Levaquin   DVT Prophylaxis Lovenox  AM Labs Ordered, also please review Full Orders    Code Status full  Disposition Plan: Home  Time spent in minutes : 31 minutes  Condition GUARDED   @SIGNATURE @

## 2014-03-16 ENCOUNTER — Encounter (HOSPITAL_COMMUNITY): Payer: Self-pay | Admitting: *Deleted

## 2014-03-16 DIAGNOSIS — I2510 Atherosclerotic heart disease of native coronary artery without angina pectoris: Secondary | ICD-10-CM | POA: Diagnosis not present

## 2014-03-16 DIAGNOSIS — C50919 Malignant neoplasm of unspecified site of unspecified female breast: Secondary | ICD-10-CM

## 2014-03-16 DIAGNOSIS — I10 Essential (primary) hypertension: Secondary | ICD-10-CM | POA: Diagnosis not present

## 2014-03-16 DIAGNOSIS — J209 Acute bronchitis, unspecified: Secondary | ICD-10-CM | POA: Diagnosis not present

## 2014-03-16 DIAGNOSIS — J440 Chronic obstructive pulmonary disease with acute lower respiratory infection: Secondary | ICD-10-CM | POA: Diagnosis present

## 2014-03-16 LAB — URINE CULTURE
COLONY COUNT: NO GROWTH
CULTURE: NO GROWTH

## 2014-03-16 MED ORDER — DIPHENHYDRAMINE HCL 12.5 MG/5ML PO ELIX
12.5000 mg | ORAL_SOLUTION | Freq: Once | ORAL | Status: AC
  Administered 2014-03-16: 12.5 mg via ORAL
  Filled 2014-03-16: qty 5

## 2014-03-16 MED ORDER — ENSURE COMPLETE PO LIQD
237.0000 mL | Freq: Two times a day (BID) | ORAL | Status: DC
  Administered 2014-03-16 – 2014-03-17 (×3): 237 mL via ORAL

## 2014-03-16 MED ORDER — IPRATROPIUM-ALBUTEROL 0.5-2.5 (3) MG/3ML IN SOLN
3.0000 mL | Freq: Four times a day (QID) | RESPIRATORY_TRACT | Status: DC
  Administered 2014-03-16: 3 mL via RESPIRATORY_TRACT
  Filled 2014-03-16: qty 3

## 2014-03-16 MED ORDER — IPRATROPIUM-ALBUTEROL 0.5-2.5 (3) MG/3ML IN SOLN
3.0000 mL | Freq: Two times a day (BID) | RESPIRATORY_TRACT | Status: DC
  Administered 2014-03-16 – 2014-03-17 (×2): 3 mL via RESPIRATORY_TRACT
  Filled 2014-03-16 (×2): qty 3

## 2014-03-16 MED ORDER — GUAIFENESIN 100 MG/5ML PO SOLN
5.0000 mL | ORAL | Status: DC | PRN
Start: 2014-03-16 — End: 2014-03-17
  Administered 2014-03-16: 5 mg via ORAL
  Administered 2014-03-16: 100 mg via ORAL
  Filled 2014-03-16 (×2): qty 10

## 2014-03-16 MED ORDER — ALBUTEROL SULFATE (2.5 MG/3ML) 0.083% IN NEBU
2.5000 mg | INHALATION_SOLUTION | RESPIRATORY_TRACT | Status: DC | PRN
  Administered 2014-03-16: 2.5 mg via RESPIRATORY_TRACT
  Filled 2014-03-16: qty 3

## 2014-03-16 MED ORDER — CYCLOBENZAPRINE HCL 5 MG PO TABS
5.0000 mg | ORAL_TABLET | Freq: Three times a day (TID) | ORAL | Status: DC | PRN
  Administered 2014-03-16: 5 mg via ORAL
  Filled 2014-03-16: qty 1

## 2014-03-16 NOTE — Progress Notes (Signed)
Nutrition Brief Note  Patient identified on the Malnutrition Screening Tool (MST) Report  Wt Readings from Last 10 Encounters:  03/15/14 136 lb 8 oz (61.916 kg)  03/04/14 137 lb 1.6 oz (62.188 kg)  12/08/13 135 lb 4.8 oz (61.372 kg)  11/20/13 134 lb 4.8 oz (60.918 kg)  09/17/13 136 lb 12.8 oz (62.052 kg)  06/03/13 142 lb 8 oz (64.638 kg)  05/16/13 139 lb 1.6 oz (63.095 kg)  05/06/13 137 lb 9.6 oz (62.415 kg)  04/03/13 138 lb 9.6 oz (62.869 kg)  03/26/13 139 lb 12.8 oz (63.413 kg)     Body mass index is 26.66 kg/(m^2). Patient meets criteria for overweight based on current BMI.   Current diet order is regular, patient is consuming approximately 75% of meals at this time. Labs and medications reviewed. Pt with history of asthma and GERD presenting with a 4 weeks history of productive cough, found to have acute bronchitis.  Met with pt who reports eating 2.5 well balanced meals/day at home. Denies any problems chewing/swallowing, not on any nutritional supplements. States her weight was down 3-4 pounds recently but has gone up since admission. Has been eating well despite having the cough and reports good appetite during admission. Interested in trying Ensure Complete as she has been told she needs more protein, will order.   No further nutrition interventions warranted at this time. If nutrition issues arise, please consult RD.   Mikey College MS, [place], [place] Pager [phone] After Hours Pager

## 2014-03-16 NOTE — Care Management Note (Signed)
CARE MANAGEMENT NOTE 03/16/2014  Patient:  Gloria [name], Gloria [name]   Account Number:  [id number]  Date Initiated:  03/16/2014  Documentation initiated by:  Geralynn Capri  Subjective/Objective Assessment:   80 yo female admitted with bronchitis.Outpatient Primary MD for the patient is [name], MD     Action/Plan:   Home when stable   Anticipated DC Date:     Anticipated DC Plan:  [place]  CM consult      Choice offered to / List presented to:  NA   DME arranged  NA      DME agency  NA     [place] arranged  NA      [place] agency  NA   Status of service:  In process, will continue to follow Medicare Important Message given?   (If response is "NO", the following Medicare IM given date fields will be blank) Date Medicare IM given:   Date Additional Medicare IM given:    Discharge Disposition:    Per UR Regulation:  Reviewed for med. necessity/level of care/duration of stay  If discussed at [place] of Stay Meetings, dates discussed:    Comments:  03/16/14 [place], [place] Chart reviewed for utilizationof services. No needs identified at this time.

## 2014-03-16 NOTE — Progress Notes (Addendum)
[facility] PROGRESS NOTE  RIVERS GASSMANN UVO:[id number] DOB: 12-Jun-1928 DOA: 03/15/2014 PCP: [name], MD  Brief narrative: 80 y.o. Female with past medical history of asthma, hypertension, GERD who presented to [facility] ED 03/15/2014 with history of cough productive of yellowish and greenish sputum for past one month prior to this admission. No complaints of fevers or chills. In ED, her vital signs were stable. Chest x-ray did not show acute cardiopulmonary process. She was admitted for treatment of possible acute on chronic bronchitis.  Assessment/Plan:  Principal Problem:   Bronchitis, chronic obstructive w acute bronchitis - Patient was started on Levaquin. Chest x-ray did not show evidence of pneumonia but due to concern for chronic cough she was started on Levaquin. She were to completed course of prednisone and has received prednisone in ED. She was then started on Solu-Medrol 60 mg IV every 12 hours. I think we can stop steroids and see if she improves with Levaquin and nebulizer treatments - Continue Mucinex twice daily Active Problems:   Coronary artery disease - Stable, continue aspirin   Hypertension - Continue losartan 50 mg daily   History of breast cancer - Continue tamoxifen   GERD - Continue Protonix 20 mg twice daily  Code Status: full code Family Communication: no family at the bedside  Disposition Plan: home when stable   Robbie Lis, MD  [facility] Pager [phone]  If 7PM-7AM, please contact night-coverage [email or web address] Password [facility] 03/16/2014, 10:46 AM   LOS: 1 day   Consultants:  None   Procedures:  None   Antibiotics:  Levaquin 03/15/2014 -->  HPI/Subjective: Still has cough productive of yellowish sputum.   Objective: Filed Vitals:   03/15/14 2311 03/15/14 2319 03/16/14 0515 03/16/14 0800  BP:  101/50 97/52   Pulse:  114 102   Temp:  97.6 F (36.4 C) 97.7 F (36.5 C)   TempSrc:  Oral Oral   Resp:  20 20    Height: 5' (1.524 m) 5' (1.524 m)    Weight: 62.143 kg (137 lb) 61.916 kg (136 lb 8 oz)    SpO2:  95% 93% 95%    Intake/Output Summary (Last 24 hours) at 03/16/14 1046 Last data filed at 03/16/14 0900  Gross per 24 hour  Intake    520 ml  Output      0 ml  Net    520 ml    Exam:   General:  Pt is alert, follows commands appropriately, not in acute distress  Cardiovascular: Regular rate and rhythm, S1/S2 appreciated   Respiratory: rhonchi in upper lung lobes, no wheezing   Abdomen: Soft, non tender, non distended, bowel sounds present, no guarding  Extremities: No edema, pulses DP and PT palpable bilaterally  Neuro: Grossly nonfocal  Data Reviewed: Basic Metabolic Panel:  Recent Labs Lab 03/15/14 1548  NA 141  K 3.7  CL 104  CO2 27  GLUCOSE 92  BUN 19  CREATININE 0.86  CALCIUM 9.1   Liver Function Tests: No results found for this basename: AST, ALT, ALKPHOS, BILITOT, PROT, ALBUMIN,  in the last 168 hours No results found for this basename: LIPASE, AMYLASE,  in the last 168 hours No results found for this basename: AMMONIA,  in the last 168 hours CBC:  Recent Labs Lab 03/15/14 1548  WBC 3.7*  NEUTROABS 2.1  HGB 12.6  HCT 37.4  MCV 93.5  PLT 116*   Cardiac Enzymes: No results found for this basename: CKTOTAL, CKMB, CKMBINDEX, TROPONINI,  in  the last 168 hours BNP: No components found with this basename: POCBNP,  CBG: No results found for this basename: GLUCAP,  in the last 168 hours  No results found for this or any previous visit (from the past 240 hour(s)).   Studies: Dg Chest 2 View 03/15/2014    IMPRESSION: No acute cardiopulmonary process.      Scheduled Meds: . ALPRAZolam  0.25 mg Oral QHS  . aspirin  81 mg Oral Daily  . doxepin  10 mg Oral QHS  . enoxaparin (LOVENOX) i  30 mg Subcutaneous QHS  . feeding supplement   237 mL Oral BID BM  . fluticasone  1 spray Each Nare Daily  . guaiFENesin  1,200 mg Oral BID  . ipratropium-albuterol   3 mL Nebulization BID  . levofloxacin (LEVAQUIN)  250 mg Intravenous QHS  . loratadine  10 mg Oral Daily  . losartan  50 mg Oral Daily  . pantoprazole  20 mg Oral BID  . tamoxifen  20 mg Oral Daily   Continuous Infusions: . sodium chloride 50 mL/hr at 03/15/14 2324

## 2014-03-17 DIAGNOSIS — I2510 Atherosclerotic heart disease of native coronary artery without angina pectoris: Secondary | ICD-10-CM | POA: Diagnosis not present

## 2014-03-17 DIAGNOSIS — J45909 Unspecified asthma, uncomplicated: Secondary | ICD-10-CM | POA: Diagnosis not present

## 2014-03-17 DIAGNOSIS — C50919 Malignant neoplasm of unspecified site of unspecified female breast: Secondary | ICD-10-CM | POA: Diagnosis not present

## 2014-03-17 DIAGNOSIS — J209 Acute bronchitis, unspecified: Secondary | ICD-10-CM | POA: Diagnosis not present

## 2014-03-17 LAB — CBC
HEMATOCRIT: 34.9 % — AB (ref 36.0–46.0)
Hemoglobin: 11.6 g/dL — ABNORMAL LOW (ref 12.0–15.0)
MCH: 31.3 pg (ref 26.0–34.0)
MCHC: 33.2 g/dL (ref 30.0–36.0)
MCV: 94.1 fL (ref 78.0–100.0)
PLATELETS: 119 10*3/uL — AB (ref 150–400)
RBC: 3.71 MIL/uL — AB (ref 3.87–5.11)
RDW: 13.6 % (ref 11.5–15.5)
WBC: 12.9 10*3/uL — ABNORMAL HIGH (ref 4.0–10.5)

## 2014-03-17 MED ORDER — LEVOFLOXACIN 500 MG PO TABS: 500.0000 mg | ORAL_TABLET | Freq: Every day | ORAL | Status: DC

## 2014-03-17 MED ORDER — ALBUTEROL SULFATE HFA 108 (90 BASE) MCG/ACT IN AERS: 2.0000 | INHALATION_SPRAY | RESPIRATORY_TRACT | Status: DC | PRN

## 2014-03-17 MED ORDER — IPRATROPIUM-ALBUTEROL 0.5-2.5 (3) MG/3ML IN SOLN: 3.0000 mL | RESPIRATORY_TRACT | Status: DC | PRN

## 2014-03-17 NOTE — Care Management Note (Signed)
Md order for HHPT. Per pt choice [facility] to provide [facility] services. [facility] rep [facility] notified prior to dc.    Venita Lick Holger Sokolowski,MSN,RN [phone]

## 2014-03-17 NOTE — Discharge Instructions (Signed)
Bronchitis Bronchitis is inflammation of the airways that extend from the windpipe into the lungs (bronchi). The inflammation often causes mucus to develop, which leads to a cough. If the inflammation becomes severe, it may cause shortness of breath. CAUSES  Bronchitis may be caused by:   Viral infections.   Bacteria.   Cigarette smoke.   Allergens, pollutants, and other irritants.  SIGNS AND SYMPTOMS  The most common symptom of bronchitis is a frequent cough that produces mucus. Other symptoms include:  Fever.   Body aches.   Chest congestion.   Chills.   Shortness of breath.   Sore throat.  DIAGNOSIS  Bronchitis is usually diagnosed through a medical history and physical exam. Tests, such as chest X-rays, are sometimes done to rule out other conditions.  TREATMENT  You may need to avoid contact with whatever caused the problem (smoking, for example). Medicines are sometimes needed. These may include:  Antibiotics. These may be prescribed if the condition is caused by bacteria.  Cough suppressants. These may be prescribed for relief of cough symptoms.   Inhaled medicines. These may be prescribed to help open your airways and make it easier for you to breathe.   Steroid medicines. These may be prescribed for those with recurrent (chronic) bronchitis. HOME CARE INSTRUCTIONS  Get plenty of rest.   Drink enough fluids to keep your urine clear or pale yellow (unless you have a medical condition that requires fluid restriction). Increasing fluids may help thin your secretions and will prevent dehydration.   Only take over-the-counter or prescription medicines as directed by your health care provider.  Only take antibiotics as directed. Make sure you finish them even if you start to feel better.  Avoid secondhand smoke, irritating chemicals, and strong fumes. These will make bronchitis worse. If you are a smoker, quit smoking. Consider using nicotine gum or  skin patches to help control withdrawal symptoms. Quitting smoking will help your lungs heal faster.   Put a cool-mist humidifier in your bedroom at night to moisten the air. This may help loosen mucus. Change the water in the humidifier daily. You can also run the hot water in your shower and sit in the bathroom with the door closed for 5 10 minutes.   Follow up with your health care provider as directed.   Wash your hands frequently to avoid catching bronchitis again or spreading an infection to others.  SEEK MEDICAL CARE IF: Your symptoms do not improve after 1 week of treatment.  SEEK IMMEDIATE MEDICAL CARE IF:  Your fever increases.  You have chills.   You have chest pain.   You have worsening shortness of breath.   You have bloody sputum.  You faint.  You have lightheadedness.  You have a severe headache.   You vomit repeatedly. MAKE SURE YOU:   Understand these instructions.  Will watch your condition.  Will get help right away if you are not doing well or get worse. Document Released: 10/16/2005 Document Revised: 08/06/2013 Document Reviewed: 06/10/2013 [facility] Patient Information 2014 [place]. Cough, Adult  A cough is a reflex that helps clear your throat and airways. It can help heal the body or may be a reaction to an irritated airway. A cough may only last 2 or 3 weeks (acute) or may last more than 8 weeks (chronic).  CAUSES Acute cough:  Viral or bacterial infections. Chronic cough:  Infections.  Allergies.  Asthma.  Post-nasal drip.  Smoking.  Heartburn or acid reflux.  Some  medicines.  Chronic lung problems (COPD).  Cancer. SYMPTOMS   Cough.  Fever.  Chest pain.  Increased breathing rate.  High-pitched whistling sound when breathing (wheezing).  Colored mucus that you cough up (sputum). TREATMENT   A bacterial cough may be treated with antibiotic medicine.  A viral cough must run its course and will not  respond to antibiotics.  Your caregiver may recommend other treatments if you have a chronic cough. HOME CARE INSTRUCTIONS   Only take over-the-counter or prescription medicines for pain, discomfort, or fever as directed by your caregiver. Use cough suppressants only as directed by your caregiver.  Use a cold steam vaporizer or humidifier in your bedroom or home to help loosen secretions.  Sleep in a semi-upright position if your cough is worse at night.  Rest as needed.  Stop smoking if you smoke. SEEK IMMEDIATE MEDICAL CARE IF:   You have pus in your sputum.  Your cough starts to worsen.  You cannot control your cough with suppressants and are losing sleep.  You begin coughing up blood.  You have difficulty breathing.  You develop pain which is getting worse or is uncontrolled with medicine.  You have a fever. MAKE SURE YOU:   Understand these instructions.  Will watch your condition.  Will get help right away if you are not doing well or get worse. Document Released: 04/14/2011 Document Revised: 01/08/2012 Document Reviewed: 04/14/2011 [facility] Patient Information 2014 [place].

## 2014-03-17 NOTE — Discharge Summary (Signed)
Physician Discharge Summary  Gloria [name] NWG:[id number] DOB: 1928/01/04 DOA: 03/15/2014  PCP: Gloria Argyle, MD  Admit date: 03/15/2014 Discharge date: 03/17/2014  Recommendations for Outpatient Follow-up:  1. You may continue Levaquin 500 mg daily for additional 8 days on discharge. 2. Continue DuoNeb nebulizer every 4 hours as needed for shortness of breath or wheezing   Addendum: the following day after discharge pt called me and told me she had lower extremity swelling so I gave her lasix 20 mg daily for 5 days and 2 days supply of potassium 20 meq daily. She was going to follow up with PCP to make sure swelling is down. She then called me again and said this has improved. No shortness of breath or chest pain.  Discharge Diagnoses:  Principal Problem:   Bronchitis, chronic obstructive w acute bronchitis Active Problems:   Asthma, intrinsic   Bronchitis with bronchospasm   HYPERTENSION, BENIGN   G E R D    Discharge Condition: stable  Diet recommendation: as tolerated   History of present illness:  80 y.o. Female with past medical history of asthma, hypertension, GERD who presented to [facility] ED 03/15/2014 with history of cough productive of yellowish and greenish sputum for past one month prior to this admission. No complaints of fevers or chills. In ED, her vital signs were stable. Chest x-ray did not show acute cardiopulmonary process. She was admitted for treatment of possible acute on chronic bronchitis.   Assessment/Plan:   Principal Problem:  Bronchitis, chronic obstructive w acute bronchitis  - Patient was started on Levaquin. Chest x-ray did not show evidence of pneumonia but due to concern for chronic cough she was started on Levaquin. She were to completed course of prednisone and has received prednisone in ED. She was then started on Solu-Medrol 60 mg IV every 12 hours. We then stopped steroids and she improved with Levaquin and nebulizer treatments. This was  prescribed on discharge, Levaquin for 8 more days. - Continue Mucinex twice daily PRN Active Problems:  Coronary artery disease  - Stable, continue aspirin  Hypertension  - Continue losartan 50 mg daily  History of breast cancer  - Continue tamoxifen  GERD  - Continue Protonix 20 mg twice daily   Code Status: full code  Family Communication: no family at the bedside   Consultants:  None  Procedures:  None  Antibiotics:  Levaquin 03/15/2014 --> for 8 days on discharge    Signed:  Robbie Lis, MD  [facility] 03/17/2014, 11:04 AM  Pager #: [phone]   Discharge Exam: Filed Vitals:   03/17/14 0930  BP:   Pulse: 111  Temp:   Resp:    Filed Vitals:   03/16/14 2210 03/17/14 0448 03/17/14 0840 03/17/14 0930  BP: 95/53 98/50    Pulse: 100 105  111  Temp: 98.6 F (37 C) 98.6 F (37 C)    TempSrc: Oral Oral    Resp: 18 20    Height:      Weight:      SpO2: 96% 96% 95% 95%    General: Pt is alert, follows commands appropriately, not in acute distress Cardiovascular: Regular rate and rhythm, S1/S2 appreciated  Respiratory: Coarse breath sounds, no wheezing  Abdominal: Soft, non tender, non distended, bowel sounds +, no guarding Extremities: no edema, no cyanosis, pulses palpable bilaterally DP and PT Neuro: Grossly nonfocal  Discharge Instructions  Discharge Instructions   Call MD for:  difficulty breathing, headache or visual disturbances  Complete by:  As directed      Call MD for:  persistant dizziness or light-headedness    Complete by:  As directed      Call MD for:  persistant nausea and vomiting    Complete by:  As directed      Call MD for:  severe uncontrolled pain    Complete by:  As directed      Diet - low sodium heart healthy    Complete by:  As directed      Discharge instructions    Complete by:  As directed   1. You may continue Levaquin 500 mg daily for additional 8 days on discharge. 2. Continue DuoNeb nebulizer every 4  hours as needed for shortness of breath or wheezing     Increase activity slowly    Complete by:  As directed             Medication List    STOP taking these medications       benzonatate 100 MG capsule  Commonly known as:  TESSALON      TAKE these medications       albuterol 108 (90 BASE) MCG/ACT inhaler  Commonly known as:  PROVENTIL HFA;VENTOLIN HFA  Inhale 2 puffs into the lungs every 4 (four) hours as needed for wheezing or shortness of breath.     ALPRAZolam 0.25 MG tablet  Commonly known as:  XANAX  Take 0.25 mg by mouth at bedtime.     aspirin 81 MG chewable tablet  Chew 81 mg by mouth daily.     CALTRATE 600+D PO  Take 1 tablet by mouth daily.     cetirizine 10 MG tablet  Commonly known as:  ZYRTEC  Take 10 mg by mouth daily.     doxepin 10 MG capsule  Commonly known as:  SINEQUAN  Take 10 mg by mouth at bedtime.     fluticasone 50 MCG/ACT nasal spray  Commonly known as:  FLONASE  Place 1 spray into both nostrils daily.     guaiFENesin 600 MG 12 hr tablet  Commonly known as:  MUCINEX  Take 1 to 2 every 12 hours as needed for cough/congestion/thick mucus     ipratropium-albuterol 0.5-2.5 (3) MG/3ML Soln  Commonly known as:  DUONEB  Take 3 mLs by nebulization every 4 (four) hours as needed.     levofloxacin 500 MG tablet  Commonly known as:  LEVAQUIN  Take 1 tablet (500 mg total) by mouth daily.     losartan 50 MG tablet  Commonly known as:  COZAAR  Take 50 mg by mouth daily.     MAGNESIUM PO  Take 250 mg by mouth daily.     tamoxifen 20 MG tablet  Commonly known as:  NOLVADEX  Take 1 tablet (20 mg total) by mouth daily.           Follow-up Information   Follow up with Gloria Argyle, MD. Schedule an appointment as soon as possible for a visit in 1 week.   Specialty:  Internal Medicine   Contact information:   [address] [place] [address] [phone]        The results of significant diagnostics  from this hospitalization (including imaging, microbiology, ancillary and laboratory) are listed below for reference.    Significant Diagnostic Studies: Dg Chest 2 View  03/15/2014   CLINICAL DATA:  Productive cough  EXAM: CHEST  2 VIEW  COMPARISON:  DG CHEST  2 VIEW dated 03/26/2013  FINDINGS: Evidence of left mastectomy. The lungs are clear. Minimal biapical pleural thickening noted. No pleural effusion. Heart size is normal. The aorta is unfolded and ectatic. Mild mid thoracic kyphosis is reidentified.  IMPRESSION: No acute cardiopulmonary process.   Electronically Signed   By: Conchita Paris M.D.   On: 03/15/2014 15:39    Microbiology: Recent Results (from the past 240 hour(s))  URINE CULTURE     Status: None   Collection Time    03/15/14  4:19 PM      Result Value Ref Range Status   Specimen Description URINE, CLEAN CATCH   Final   Special Requests NONE   Final   Culture  Setup Time     Final   Value: 03/15/2014 23:00     Performed at [facility]     Final   Value: NO GROWTH     Performed at [facility]   Culture     Final   Value: NO GROWTH     Performed at [facility]   Report Status 03/16/2014 FINAL   Final     Labs: Basic Metabolic Panel:  Recent Labs Lab 03/15/14 1548  NA 141  K 3.7  CL 104  CO2 27  GLUCOSE 92  BUN 19  CREATININE 0.86  CALCIUM 9.1   Liver Function Tests: No results found for this basename: AST, ALT, ALKPHOS, BILITOT, PROT, ALBUMIN,  in the last 168 hours No results found for this basename: LIPASE, AMYLASE,  in the last 168 hours No results found for this basename: AMMONIA,  in the last 168 hours CBC:  Recent Labs Lab 03/15/14 1548 03/17/14 0830  WBC 3.7* 12.9*  NEUTROABS 2.1  --   HGB 12.6 11.6*  HCT 37.4 34.9*  MCV 93.5 94.1  PLT 116* 119*   Cardiac Enzymes: No results found for this basename: CKTOTAL, CKMB, CKMBINDEX, TROPONINI,  in the last 168 hours BNP: BNP (last 3 results)  Recent  Labs  03/15/14 1548  PROBNP 135.5   CBG: No results found for this basename: GLUCAP,  in the last 168 hours  Time coordinating discharge: Over 30 minutes

## 2014-03-17 NOTE — Evaluation (Signed)
Physical Therapy Evaluation Patient Details Name: Gloria [name] MRN: [id number] DOB: 10-Jan-1928 Today's Date: 03/17/2014   History of Present Illness  80 y.o. female admitted with acute bronchitis.  Clinical Impression  **Pt admitted with *acute bronchitis**. Pt currently with functional limitations due to the deficits listed below (see PT Problem List).  Pt will benefit from skilled PT to increase their independence and safety with mobility to allow discharge to the venue listed below.   *    Follow Up Recommendations Home health PT    Equipment Recommendations  None recommended by PT    Recommendations for Other Services       Precautions / Restrictions Precautions Precautions: None Restrictions Weight Bearing Restrictions: No      Mobility  Bed Mobility Overal bed mobility: Modified Independent             General bed mobility comments: with rail, HOB up  Transfers Overall transfer level: Independent Equipment used: None                Ambulation/Gait Ambulation/Gait assistance: Modified independent (Device/Increase time) Ambulation Distance (Feet): 140 Feet Assistive device: Rolling walker (2 wheeled)     Gait velocity interpretation: at or above normal speed for age/gender General Gait Details: steady with RW, normally walks without AD but felt steadier with RW, distance limited by fatigue (pt states she exhausted from coughing)  Stairs            Wheelchair Mobility    Modified Rankin (Stroke Patients Only)       Balance                                             Pertinent Vitals/Pain *SaO2 95% on RA at rest and with walking HR 103 at rest, 111 with walking 1/4 dyspnea with walking**    Home Living Family/patient expects to be discharged to:: Private residence Living Arrangements: Alone Available Help at Discharge: Family;Available PRN/intermittently (daughter lives nearby but works during day, Curator  staying with pt for a few weeks right now) Type of Home: House Home Access: Stairs to enter   Technical brewer of Steps: 1 Home Layout: Two level Home Equipment: None Additional Comments: can borrow DME if needed    Prior Function Level of Independence: Independent               Hand Dominance        Extremity/Trunk Assessment   Upper Extremity Assessment: Overall WFL for tasks assessed           Lower Extremity Assessment: Overall WFL for tasks assessed      Cervical / Trunk Assessment: Normal  Communication   Communication: No difficulties  Cognition Arousal/Alertness: Awake/alert Behavior During Therapy: WFL for tasks assessed/performed Overall Cognitive Status: Within Functional Limits for tasks assessed                      General Comments      Exercises        Assessment/Plan    PT Assessment Patient needs continued PT services  PT Diagnosis Generalized weakness   PT Problem List Decreased activity tolerance;Decreased mobility  PT Treatment Interventions Gait training;DME instruction;Stair training;Functional mobility training;Therapeutic activities;Patient/family education;Therapeutic exercise   PT Goals (Current goals can be found in the Care Plan section) Acute Rehab PT Goals Patient Stated Goal: return to  independence PT Goal Formulation: With patient Time For Goal Achievement: 03/31/14 Potential to Achieve Goals: Good    Frequency Min 3X/week   Barriers to discharge        Co-evaluation               End of Session Equipment Utilized During Treatment: Gait belt Activity Tolerance: Patient tolerated treatment well Patient left: in bed;with call bell/phone within reach           Time: 0912-0927 PT Time Calculation (min): 15 min   Charges:   PT Evaluation $Initial PT Evaluation Tier I: 1 Procedure PT Treatments $Gait Training: 8-22 mins   PT G CodesLucile Crater 03/17/2014,  9:34 AM [phone]

## 2014-03-18 ENCOUNTER — Other Ambulatory Visit: Payer: Medicare Other

## 2014-03-18 DIAGNOSIS — K219 Gastro-esophageal reflux disease without esophagitis: Secondary | ICD-10-CM | POA: Diagnosis not present

## 2014-03-18 DIAGNOSIS — F17200 Nicotine dependence, unspecified, uncomplicated: Secondary | ICD-10-CM | POA: Diagnosis not present

## 2014-03-18 DIAGNOSIS — Z853 Personal history of malignant neoplasm of breast: Secondary | ICD-10-CM | POA: Diagnosis not present

## 2014-03-18 DIAGNOSIS — I10 Essential (primary) hypertension: Secondary | ICD-10-CM | POA: Diagnosis not present

## 2014-03-18 DIAGNOSIS — J209 Acute bronchitis, unspecified: Secondary | ICD-10-CM | POA: Diagnosis not present

## 2014-03-18 DIAGNOSIS — J45901 Unspecified asthma with (acute) exacerbation: Secondary | ICD-10-CM | POA: Diagnosis not present

## 2014-03-18 DIAGNOSIS — I2510 Atherosclerotic heart disease of native coronary artery without angina pectoris: Secondary | ICD-10-CM | POA: Diagnosis not present

## 2014-03-18 DIAGNOSIS — IMO0001 Reserved for inherently not codable concepts without codable children: Secondary | ICD-10-CM | POA: Diagnosis not present

## 2014-03-19 ENCOUNTER — Other Ambulatory Visit: Payer: Self-pay | Admitting: Adult Health

## 2014-03-20 ENCOUNTER — Emergency Department (HOSPITAL_COMMUNITY)
Admission: EM | Admit: 2014-03-20 | Discharge: 2014-03-20 | Disposition: A | Discharge status: Home / Self Care | Payer: Medicare Other | Attending: Emergency Medicine | Admitting: Emergency Medicine

## 2014-03-20 ENCOUNTER — Encounter (HOSPITAL_COMMUNITY): Payer: Self-pay | Admitting: Emergency Medicine

## 2014-03-20 DIAGNOSIS — I10 Essential (primary) hypertension: Secondary | ICD-10-CM | POA: Diagnosis not present

## 2014-03-20 DIAGNOSIS — Z87891 Personal history of nicotine dependence: Secondary | ICD-10-CM | POA: Diagnosis not present

## 2014-03-20 DIAGNOSIS — IMO0002 Reserved for concepts with insufficient information to code with codable children: Secondary | ICD-10-CM | POA: Insufficient documentation

## 2014-03-20 DIAGNOSIS — Z79899 Other long term (current) drug therapy: Secondary | ICD-10-CM | POA: Insufficient documentation

## 2014-03-20 DIAGNOSIS — Z853 Personal history of malignant neoplasm of breast: Secondary | ICD-10-CM | POA: Insufficient documentation

## 2014-03-20 DIAGNOSIS — Z9889 Other specified postprocedural states: Secondary | ICD-10-CM | POA: Diagnosis not present

## 2014-03-20 DIAGNOSIS — F329 Major depressive disorder, single episode, unspecified: Secondary | ICD-10-CM | POA: Diagnosis not present

## 2014-03-20 DIAGNOSIS — Z880 Allergy status to penicillin: Secondary | ICD-10-CM | POA: Diagnosis not present

## 2014-03-20 DIAGNOSIS — I2510 Atherosclerotic heart disease of native coronary artery without angina pectoris: Secondary | ICD-10-CM | POA: Insufficient documentation

## 2014-03-20 DIAGNOSIS — M7989 Other specified soft tissue disorders: Secondary | ICD-10-CM

## 2014-03-20 DIAGNOSIS — Z8719 Personal history of other diseases of the digestive system: Secondary | ICD-10-CM | POA: Diagnosis not present

## 2014-03-20 DIAGNOSIS — Z8781 Personal history of (healed) traumatic fracture: Secondary | ICD-10-CM | POA: Insufficient documentation

## 2014-03-20 DIAGNOSIS — M79609 Pain in unspecified limb: Secondary | ICD-10-CM | POA: Diagnosis not present

## 2014-03-20 DIAGNOSIS — F3289 Other specified depressive episodes: Secondary | ICD-10-CM | POA: Insufficient documentation

## 2014-03-20 DIAGNOSIS — Z7982 Long term (current) use of aspirin: Secondary | ICD-10-CM | POA: Insufficient documentation

## 2014-03-20 DIAGNOSIS — F411 Generalized anxiety disorder: Secondary | ICD-10-CM | POA: Insufficient documentation

## 2014-03-20 DIAGNOSIS — R609 Edema, unspecified: Secondary | ICD-10-CM | POA: Insufficient documentation

## 2014-03-20 MED ORDER — ARFORMOTEROL TARTRATE 15 MCG/2ML IN NEBU: 15.0000 ug | INHALATION_SOLUTION | Freq: Two times a day (BID) | RESPIRATORY_TRACT | Status: DC

## 2014-03-20 MED ORDER — BUDESONIDE 0.5 MG/2ML IN SUSP: 0.5000 mg | Freq: Two times a day (BID) | RESPIRATORY_TRACT | Status: DC

## 2014-03-20 NOTE — Discharge Instructions (Signed)
Edema You do not appear to have a blood clot in your left arm.  The drainage from that arm is limited because of mastectomy and that may be related.  Your leg swelling is likely fluid related.  YOu need to use compression stockings and elevate your legs.  Follow-up with your primary doctor if symptoms persist.  Edema is an abnormal build-up of fluids in tissues. Because this is partly dependent on gravity (water flows to the lowest place), it is more common in the legs and thighs (lower extremities). It is also common in the looser tissues, like around the eyes. Painless swelling of the feet and ankles is common and increases as a person ages. It may affect both legs and may include the calves or even thighs. When squeezed, the fluid may move out of the affected area and may leave a dent for a few moments. CAUSES   Prolonged standing or sitting in one place for extended periods of time. Movement helps pump tissue fluid into the veins, and absence of movement prevents this, resulting in edema.  Varicose veins. The valves in the veins do not work as well as they should. This causes fluid to leak into the tissues.  Fluid and salt overload.  Injury, burn, or surgery to the leg, ankle, or foot, may damage veins and allow fluid to leak out.  Sunburn damages vessels. Leaky vessels allow fluid to go out into the sunburned tissues.  Allergies (from insect bites or stings, medications or chemicals) cause swelling by allowing vessels to become leaky.  Protein in the blood helps keep fluid in your vessels. Low protein, as in malnutrition, allows fluid to leak out.  Hormonal changes, including pregnancy and menstruation, cause fluid retention. This fluid may leak out of vessels and cause edema.  Medications that cause fluid retention. Examples are sex hormones, blood pressure medications, steroid treatment, or anti-depressants.  Some illnesses cause edema, especially heart failure, kidney disease, or  liver disease.  Surgery that cuts veins or lymph nodes, such as surgery done for the heart or for breast cancer, may result in edema. DIAGNOSIS  Your caregiver is usually easily able to determine what is causing your swelling (edema) by simply asking what is wrong (getting a history) and examining you (doing a physical). Sometimes x-rays, EKG (electrocardiogram or heart tracing), and blood work may be done to evaluate for underlying medical illness. TREATMENT  General treatment includes:  Leg elevation (or elevation of the affected body part).  Restriction of fluid intake.  Prevention of fluid overload.  Compression of the affected body part. Compression with elastic bandages or support stockings squeezes the tissues, preventing fluid from entering and forcing it back into the blood vessels.  Diuretics (also called water pills or fluid pills) pull fluid out of your body in the form of increased urination. These are effective in reducing the swelling, but can have side effects and must be used only under your caregiver's supervision. Diuretics are appropriate only for some types of edema. The specific treatment can be directed at any underlying causes discovered. Heart, liver, or kidney disease should be treated appropriately. HOME CARE INSTRUCTIONS   Elevate the legs (or affected body part) above the level of the heart, while lying down.  Avoid sitting or standing still for prolonged periods of time.  Avoid putting anything directly under the knees when lying down, and do not wear constricting clothing or garters on the upper legs.  Exercising the legs causes the fluid to work  back into the veins and lymphatic channels. This may help the swelling go down.  The pressure applied by elastic bandages or support stockings can help reduce ankle swelling.  A low-salt diet may help reduce fluid retention and decrease the ankle swelling.  Take any medications exactly as prescribed. SEEK  MEDICAL CARE IF:  Your edema is not responding to recommended treatments. SEEK IMMEDIATE MEDICAL CARE IF:   You develop shortness of breath or chest pain.  You cannot breathe when you lay down; or if, while lying down, you have to get up and go to the window to get your breath.  You are having increasing swelling without relief from treatment.  You develop a fever over 102 F (38.9 C).  You develop pain or redness in the areas that are swollen.  Tell your caregiver right away if you have gained 03 lb/1.4 kg in 1 day or 05 lb/2.3 kg in a week. MAKE SURE YOU:   Understand these instructions.  Will watch your condition.  Will get help right away if you are not doing well or get worse. Document Released: 10/16/2005 Document Revised: 04/16/2012 Document Reviewed: 06/03/2008 [facility] Patient Information 2014 [place].

## 2014-03-20 NOTE — ED Notes (Signed)
Vascular tech at bedside. °

## 2014-03-20 NOTE — ED Notes (Addendum)
Pt sent by PCP due to swelling in left arm and bilateral legs. PCP worried about blood clot. Denies pain at the moment but states one of her legs were hurting previously.

## 2014-03-20 NOTE — Progress Notes (Signed)
*  PRELIMINARY RESULTS* Vascular Ultrasound Left upper extremity venous duplex has been completed.  Preliminary findings:   No evidence of DVT or superficial thrombosis.     Landry Mellow, RDMS, RVT  03/20/2014, 2:34 PM

## 2014-03-20 NOTE — ED Provider Notes (Signed)
CSN: [id number]     Arrival date & time 03/20/14  1238 History   First MD Initiated Contact with Patient 03/20/14 1309     Chief Complaint  Patient presents with  . Arm Swelling    left  . Leg Swelling    bilater     (Consider location/radiation/quality/duration/timing/severity/associated sxs/prior Treatment) HPI  This is an 80 year old female with a recent admission for bronchitis with a history of hypertension and coronary artery disease as well as mastectomy who presents with left arm swelling. Patient reports increasing left arm swelling since discharge. She reports pain in that extremity as well.  She denies any skin changes or rash. Patient also states that she has noticed increased swelling of her bilateral lower extremity. She reports increased fluid intake. She denies shortness of breath or chest pain. She does continue to have a persistent cough.  Per the patient, her primary care physician was concerned for blood clot in the left upper extremity and sent her here for further evaluation. Of note, patient is also requesting refill of her chronic respiratory medications.  Past Medical History  Diagnosis Date  . Cancer     breast- left  . GERD (gastroesophageal reflux disease)   . Hypertension   . Anxiety   . Depression   . Blood in urine   . Knee fracture   . CAD (coronary artery disease)     non obstructive by cath  . TR (tricuspid regurgitation)     mild by Echo 12/2008 EF >55%   Past Surgical History  Procedure Laterality Date  . Tonsillectomy    . Breast surgery  08-30-10    mastectomy  . Colon surgery    . Abdominal hysterectomy    . Cardiac catheterization  08/2011    20% LAD stenosis, 20% diagonal stenosis, 10-20% proximal dominant RCA stenosis   Family History  Problem Relation Age of Onset  . Cancer Father     prostate  . Cancer Sister     breast  . Heart disease Sister   . Cancer Brother     pancreatic  . Cancer Maternal Grandmother     colon  .  Dementia Mother   . Sudden death Brother   . Heart attack Brother   . Heart attack Brother   . Arthritis Sister    History  Substance Use Topics  . Smoking status: Former Smoker    Quit date: 11/15/1991  . Smokeless tobacco: Never Used  . Alcohol Use: 1.2 oz/week    2 Glasses of wine per week     Comment: 1 glass per week   OB History   Grav Para Term Preterm Abortions TAB SAB Ect Mult Living                 Review of Systems  Constitutional: Negative for fever.  Respiratory: Positive for cough. Negative for chest tightness and shortness of breath.   Cardiovascular: Positive for leg swelling. Negative for chest pain.  Gastrointestinal: Negative for nausea, vomiting and abdominal pain.  Genitourinary: Negative for dysuria.  Musculoskeletal: Negative for back pain.       Left arm swelling and pain  Skin: Negative for rash.  Neurological: Negative for headaches.  Psychiatric/Behavioral: Negative for confusion.  All other systems reviewed and are negative.     Allergies  Contrast media; Adhesive; Cephalexin; Ciprofloxacin; Demerol; Penicillins; Sulfamethoxazole-trimethoprim; and Quinolones  Home Medications   Prior to Admission medications   Medication Sig Start Date End Date Taking?  Authorizing Provider  albuterol (PROVENTIL HFA;VENTOLIN HFA) 108 (90 BASE) MCG/ACT inhaler Inhale 2 puffs into the lungs every 4 (four) hours as needed for wheezing or shortness of breath. 03/17/14  Yes Robbie Lis, MD  ALPRAZolam Duanne Moron) 0.25 MG tablet Take 0.25 mg by mouth at bedtime.    Yes Historical Provider, MD  aspirin 81 MG chewable tablet Chew 81 mg by mouth daily.     Yes Historical Provider, MD  Calcium Carbonate-Vitamin D (CALTRATE 600+D PO) Take 1 tablet by mouth daily.    Yes Historical Provider, MD  cetirizine (ZYRTEC) 10 MG tablet Take 10 mg by mouth daily.    Yes Historical Provider, MD  doxepin (SINEQUAN) 10 MG capsule Take 10 mg by mouth at bedtime.    Yes Historical  Provider, MD  fluticasone (FLONASE) 50 MCG/ACT nasal spray Place 1 spray into both nostrils daily.   Yes Historical Provider, MD  guaiFENesin (MUCINEX) 600 MG 12 hr tablet Take 1 to 2 every 12 hours as needed for cough/congestion/thick mucus   Yes Historical Provider, MD  ipratropium-albuterol (DUONEB) 0.5-2.5 (3) MG/3ML SOLN Take 3 mLs by nebulization every 4 (four) hours as needed. 03/17/14  Yes Robbie Lis, MD  levofloxacin (LEVAQUIN) 500 MG tablet Take 1 tablet (500 mg total) by mouth daily. 03/17/14  Yes Robbie Lis, MD  losartan (COZAAR) 50 MG tablet Take 50 mg by mouth daily.   Yes Historical Provider, MD  MAGNESIUM PO Take 250 mg by mouth daily.    Yes Historical Provider, MD  tamoxifen (NOLVADEX) 20 MG tablet Take 1 tablet (20 mg total) by mouth daily. 03/04/14  Yes Maryanna Shape, NP  arformoterol (BROVANA) 15 MCG/2ML NEBU Take 2 mLs (15 mcg total) by nebulization every 12 (twelve) hours. 03/20/14   Merryl Hacker, MD  budesonide (PULMICORT) 0.5 MG/2ML nebulizer solution Take 2 mLs (0.5 mg total) by nebulization every 12 (twelve) hours. 03/20/14   Merryl Hacker, MD   BP 140/78  Pulse 90  Temp(Src) 98.1 F (36.7 C) (Oral)  Resp 16  SpO2 97% Physical Exam  Nursing note and vitals reviewed. Constitutional: She is oriented to person, place, and time. She appears well-developed and well-nourished. No distress.  Occasional cough, appears younger than stated age  HENT:  Head: Normocephalic and atraumatic.  Neck: No JVD present.  Cardiovascular: Normal rate, regular rhythm and normal heart sounds.   No murmur heard. Pulmonary/Chest: Effort normal and breath sounds normal. No respiratory distress. She has no wheezes.  Abdominal: Soft. Bowel sounds are normal. There is no tenderness. There is no rebound and no guarding.  Musculoskeletal:  Mild tenderness and swelling over the left forearm, no overlying skin changes, 1+ bilateral lower extremity swelling that is pitting, negative  Homans sign  Neurological: She is alert and oriented to person, place, and time.  Skin: Skin is warm and dry.  Psychiatric: She has a normal mood and affect.    ED Course  Procedures (including critical care time) Labs Review Labs Reviewed - No data to display  Imaging Review No results found.   EKG Interpretation None      MDM   Final diagnoses:  Left arm swelling  Dependent edema    Patient presents with left arm swelling bilateral lower extremity swelling. She is nontoxic on exam. She is otherwise without complaints. She has a mastectomy on the left side. Would be concerned for DVT in the left upper extremity. Regarding bilateral lower extremity swelling, it is symmetric  and nontender. Likely related to dependent edema and fluid retention. Ultrasound of the left upper extremity negative for DVT. Suspect patient's mild swelling may be related to impaired lymphatic drainage from that arm given mastectomy. Discuss results with the patient.  Encouraged patient to use compression stockings and elevation for swollen lower extremities. Will refill patient's pulmonary medications as requested.  Patient to follow-up with PCP.  After history, exam, and medical workup I feel the patient has been appropriately medically screened and is safe for discharge home. Pertinent diagnoses were discussed with the patient. Patient was given return precautions.    Merryl Hacker, MD 03/20/14 [phone]

## 2014-03-24 ENCOUNTER — Ambulatory Visit
Admission: RE | Admit: 2014-03-24 | Discharge: 2014-03-24 | Disposition: A | Discharge status: Home / Self Care | Payer: Medicare Other | Source: Ambulatory Visit | Attending: Oncology | Admitting: Oncology

## 2014-03-24 DIAGNOSIS — M7989 Other specified soft tissue disorders: Secondary | ICD-10-CM

## 2014-03-24 DIAGNOSIS — Z9012 Acquired absence of left breast and nipple: Secondary | ICD-10-CM

## 2014-03-24 DIAGNOSIS — N63 Unspecified lump in unspecified breast: Secondary | ICD-10-CM | POA: Diagnosis not present

## 2014-03-24 DIAGNOSIS — Z853 Personal history of malignant neoplasm of breast: Secondary | ICD-10-CM

## 2014-03-25 DIAGNOSIS — J45909 Unspecified asthma, uncomplicated: Secondary | ICD-10-CM | POA: Diagnosis not present

## 2014-03-26 DIAGNOSIS — IMO0001 Reserved for inherently not codable concepts without codable children: Secondary | ICD-10-CM | POA: Diagnosis not present

## 2014-03-26 DIAGNOSIS — F17200 Nicotine dependence, unspecified, uncomplicated: Secondary | ICD-10-CM | POA: Diagnosis not present

## 2014-03-26 DIAGNOSIS — J209 Acute bronchitis, unspecified: Secondary | ICD-10-CM | POA: Diagnosis not present

## 2014-03-26 DIAGNOSIS — I10 Essential (primary) hypertension: Secondary | ICD-10-CM | POA: Diagnosis not present

## 2014-03-26 DIAGNOSIS — J45901 Unspecified asthma with (acute) exacerbation: Secondary | ICD-10-CM | POA: Diagnosis not present

## 2014-03-26 DIAGNOSIS — I2510 Atherosclerotic heart disease of native coronary artery without angina pectoris: Secondary | ICD-10-CM | POA: Diagnosis not present

## 2014-03-30 ENCOUNTER — Encounter: Payer: Self-pay | Admitting: Internal Medicine

## 2014-03-30 ENCOUNTER — Ambulatory Visit (INDEPENDENT_AMBULATORY_CARE_PROVIDER_SITE_OTHER): Payer: Medicare Other | Admitting: Internal Medicine

## 2014-03-30 VITALS — BP 108/62 | HR 93 | Temp 98.3°F | Ht 60.0 in | Wt 136.0 lb

## 2014-03-30 DIAGNOSIS — I2510 Atherosclerotic heart disease of native coronary artery without angina pectoris: Secondary | ICD-10-CM

## 2014-03-30 DIAGNOSIS — J45909 Unspecified asthma, uncomplicated: Secondary | ICD-10-CM | POA: Diagnosis not present

## 2014-03-30 NOTE — Patient Instructions (Addendum)
Stay on the nebulizer - if you want my imput in the future - first see   See Tammy NP   with all your medications, even over the counter meds, separated in two separate bags, the ones you take no matter what vs the ones you stop once you feel better and take only as needed when you feel you need them.   Tammy  will generate for you a new user friendly medication calendar that will put [place] all on the same page re: your medication use.     Without this process, it simply isn't possible to assure that we are providing  your outpatient care  with  the attention to detail we feel you deserve.   If we cannot assure that you're getting that kind of care,  then we cannot manage your problem effectively from this clinic.  Once you have seen Tammy and we are sure that we're all on the same page with your medication use she will arrange follow up with me.   For nasal symptoms (stuffy, runny, sneezy ) use zyrtec as needed  For Breathing: Only use your albuterol(red inhaler = proair)  as a rescue medication to be used if you can't catch your breath by resting or doing a relaxed purse lip breathing pattern.  - The less you use it, the better it will work when you need it. - Ok to use up to 2 puffs  every 4 hours if you must but call for immediate appointment if use goes up over your usual need - Don't leave home without it !!  (think of it like the spare tire for your car)   If the event of worse night time cough > pepcid ac 20 mg one bedtime   GERD (REFLUX)  is an extremely common cause of respiratory symptoms, many times with no significant heartburn at all.    It can be treated with medication, but also with lifestyle changes including avoidance of late meals, excessive alcohol, smoking cessation, and avoid fatty foods, chocolate, peppermint, colas, red wine, and acidic juices such as orange juice.  NO MINT OR MENTHOL PRODUCTS SO NO COUGH DROPS  USE SUGARLESS CANDY INSTEAD (jolley ranchers or Stover's)  NO  OIL BASED VITAMINS - use powdered substitutes.

## 2014-03-30 NOTE — Progress Notes (Signed)
Subjective:     Patient ID: Gloria [name], female   DOB: 1927/12/07  MRN: [id number]   Brief patient profile:  57  yowf quit smoking 1995 with refractory cough attributed to her asthmatic bronchitis and quite a bit better after changing over to nebulized inhalers in the form of brovana and budesonide. She did develop significant leg cramps which she attributed to [facility] and seemed better when she stopped it.    History of Present Illness   December 23, 2008 reports indolent onset of recurrent cough x sev months recurred if stop zyrtec and mucinex and worried about generic budesonide not be strong enough to control her coughing and wheezing.    Rec avoid ace, use neb up to twice daily with one half dose of brovana and full dose budesonide.      02/06/2012 f/u ov/Chandon Lazcano cc cough x 2 weeks some better p levaquin completed on 4/7 with traces of brb and ear congestion and new sob on bud/brovana bid, also more trouble with hearing and persistently slt discolored mucus in am, gen post chest discomfort with coughing. No better with otc - c/o cough and congestion requiring zyrtec and mucinex at baseline maybe every other day x last 3 years rec Take mucinex dm  1200mg  every 12 hour and supplement if needed with  tramadol 50 mg up to 1-2 every 4 hours to suppress the urge to cough  Once you have eliminated the cough for 3 straight days try reducing the tramadol first,  then the delsym as tolerated.   Prednisone 10 mg take  4 each am x 2 days,   2 each am x 2 days,  1 each am x2days and stop  Prilosec 20 mg Take 30- 60 min before your first and last meals of the day and Pepcid 20 mg at bedtime Stop aspirin when you see any bleeding at all     03/06/2012 f/u ov/Daryana Whirley cc Pt c/o increased SOB x 3 wks. She states had prod cough this am (white)with small amount of BRB. Stopped neb meds 3 days prior to OV > no change in symptoms. Sob with anything more than room to room walking, no variability, no better with saba.   Can't tolerate even half dose laba without mucle spasms generalized and no perceived benefit. >CT sinus neg , CXR -chronic changes   03/11/2012 Follow up and med review  Stopped Brovana/Budesonide due to multiple side effects " will not take unless an emergency".  She is feeling better , with decreased cough and dyspnea.  CT sinus and cxr showed no acute process last office visit.  We reviewed all her meds and organized them into a med calendar w/ pt education  rec Follow med calendar closely and bring to each visit.  May leave off Nebs for now, if flare call our office back  04/23/2012 f/u ov/Yamil Dougher did produce her med calendar until the very end of the visit, stating she left "her list" at home and didn't realize she had it in her purse but doesn't correlate anyway with all the meds she's taking (pepcid and potassium not on list but in her bag of maint meds) Did not show Dr Felipa Eth the med calendar we made for her and received advair when she had budesonide/formoterol on the calendar as a maintenance. Maint c/o is sorethroat for which already prescribed zmax.   No unusual cough, purulent sputum or sinus/hb symptoms on present rx rec If your sore throat or lump in throat bothers  you please let Dr Felipa Eth refer you to an ENT doctor of his choice> apparently not seen by ent.  02/19/14 onset of deterioration with earache/ scratchy throat > 3 ov > admit.   Admit date: 03/15/2014  Discharge date: 03/17/2014  Recommendations for Outpatient Follow-up:  1. You may continue Levaquin 500 mg daily for additional 8 days on discharge.  2. Continue DuoNeb nebulizer every 4 hours as needed for shortness of breath or wheezing   Addendum: the following day after discharge pt called me and told me she had lower extremity swelling so I gave her lasix 20 mg daily for 5 days and 2 days supply of potassium 20 meq daily. She was going to follow up with PCP to make sure swelling is down. She then called me again and  said this has improved. No shortness of breath or chest pain.  Discharge Diagnoses:  Principal Problem:  Bronchitis, chronic obstructive w acute bronchitis  Active Problems:  Asthma, intrinsic  Bronchitis with bronchospasm  HYPERTENSION, BENIGN  G E R D  Discharge Condition: stable  Diet recommendation: as tolerated  History of present illness:  80 y.o. Female with past medical history of asthma, hypertension, GERD who presented to [facility] ED 03/15/2014 with history of cough productive of yellowish and greenish sputum for past one month prior to this admission. No complaints of fevers or chills. In ED, her vital signs were stable. Chest x-ray did not show acute cardiopulmonary process. She was admitted for treatment of possible acute on chronic bronchitis.  Assessment/Plan:  Principal Problem:  Bronchitis, chronic obstructive w acute bronchitis  - Patient was started on Levaquin. Chest x-ray did not show evidence of pneumonia but due to concern for chronic cough she was started on Levaquin. She were to completed course of prednisone and has received prednisone in ED. She was then started on Solu-Medrol 60 mg IV every 12 hours. We then stopped steroids and she improved with Levaquin and nebulizer treatments. This was prescribed on discharge, Levaquin for 8 more days.  - Continue Mucinex twice daily PRN   Coronary artery disease  - Stable, continue aspirin  Hypertension  - Continue losartan 50 mg daily  History of breast cancer  - Continue tamoxifen  GERD  - Continue Protonix 20 mg twice daily  Code Status: full code  Family Communication: no family at the bedside  Consultants:  None  Procedures:  None  Antibiotics:  Levaquin 03/15/2014 --> for 8 days on discharge     03/30/2014  Consultation/ Dr Felipa Eth re: recurrent cough   Chief Complaint  Patient presents with  . Follow-up    Pt last seen here in June 2013.  She was referred back per Dr Felipa Eth. She states she was admitted to  [facility] from [facility]/15/[facility] to 03/17/14 for bronchitis. She states overall feeling better, but still has some prod cough with minimal clear to yellow sputum. Her breathing is back to her normal baseline.   Confused with instructions but sure she's taking  brovana and budesonide, otherwise not sure of meds - Not limited by breathing from desired activities - cough is variably severe day > night, minimally productive.  No obvious day to day or daytime variabilty or assoc chronic cough or cp or chest tightness, subjective wheeze overt sinus or hb symptoms. No unusual exp hx or h/o childhood pna/ asthma or knowledge of premature birth.  Sleeping ok without nocturnal  or early am exacerbation  of respiratory  c/o's or need for noct saba.  Also denies any obvious fluctuation of symptoms with weather or environmental changes or other aggravating or alleviating factors except as outlined above   Current Medications, Allergies, Complete Past Medical History, Past Surgical History, Family History, and Social History were reviewed in [employer] record.  ROS  The following are not active complaints unless bolded sore throat, dysphagia, dental problems, itching, sneezing,  nasal congestion or excess/ purulent secretions, ear ache,   fever, chills, sweats, unintended wt loss, pleuritic or exertional cp, hemoptysis,  orthopnea pnd or leg swelling, presyncope, palpitations, heartburn, abdominal pain, anorexia, nausea, vomiting, diarrhea  or change in bowel or urinary habits, change in stools or urine, dysuria,hematuria,  rash, arthralgias, visual complaints, headache, numbness weakness or ataxia or problems with walking or coordination,  change in mood/affect or memory.              Allergies  1) ! Penicillin G Pot in Dextrose (Penicillin G Potassium in D5w)  2) ! Septra  3) ! * Quinolones  4) ! Doxycycline   Past Medical History:  Diverticulitis  Partial Colectomy  Chronic Anxiety Syndrome   Irritable Bowel Syndrome  G E R D  Chronic cough  - Better off ACE 11/2008  - PFT's nl 01/21/2008   Family History:  neg resp dz/ atopy   Social History:  Patient states former smoker. Quit 1995           Objective:   Physical Exam  amb elderly wf   Wt  136 02/06/2012 >  138 03/06/2012 > 140  03/11/2012 > 04/23/2012  138 > 136 03/30/2014   HEENT: nl dentition, turbinates, and orophanx. Nl external ear canals without cough reflex   NECK :  without JVD/Nodes/TM/ nl carotid upstrokes bilaterally   LUNGS: no acc muscle use, clear to A and P bilaterally without cough on insp or exp maneuvers   CV:  RRR  no s3 or murmur or increase in P2, no edema   ABD:  soft and nontender with nl excursion in the supine position. No bruits or organomegaly, bowel sounds nl  MS:  warm without deformities, calf tenderness, cyanosis or clubbing     CXR  03/06/2012 : Probable COPD. No active lung disease. CT sinus completely nl x for post op changes   Assessment:

## 2014-03-31 DIAGNOSIS — F17200 Nicotine dependence, unspecified, uncomplicated: Secondary | ICD-10-CM | POA: Diagnosis not present

## 2014-03-31 DIAGNOSIS — IMO0001 Reserved for inherently not codable concepts without codable children: Secondary | ICD-10-CM | POA: Diagnosis not present

## 2014-03-31 DIAGNOSIS — J45901 Unspecified asthma with (acute) exacerbation: Secondary | ICD-10-CM | POA: Diagnosis not present

## 2014-03-31 DIAGNOSIS — I10 Essential (primary) hypertension: Secondary | ICD-10-CM | POA: Diagnosis not present

## 2014-03-31 DIAGNOSIS — J209 Acute bronchitis, unspecified: Secondary | ICD-10-CM | POA: Diagnosis not present

## 2014-03-31 DIAGNOSIS — I2510 Atherosclerotic heart disease of native coronary artery without angina pectoris: Secondary | ICD-10-CM | POA: Diagnosis not present

## 2014-03-31 NOTE — Assessment & Plan Note (Addendum)
-   PFT's 01/02/2008  FEV1 1.73 (110%) with ratio 71 and DLCO 77%   She clearly has asthma, not copd, with difficult to control cough > sob.  DDX of  difficult airways managment all start with A and  include Adherence, Ace Inhibitors, Acid Reflux, Active Sinus Disease, Alpha 1 Antitripsin deficiency, Anxiety masquerading as Airways dz,  ABPA,  allergy(esp in young), Aspiration (esp in elderly), Adverse effects of DPI,  Active smokers, plus two Bs  = Bronchiectasis and Beta blocker use..and one C= CHF  Adherence is always the initial "prime suspect" and is a multilayered concern that requires a "trust but verify" approach in every patient - starting with knowing how to use medications, especially inhalers, correctly, keeping up with refills and understanding the fundamental difference between maintenance and prns vs those medications only taken for a very short course and then stopped and not refilled.  - totally confused now with meds/ names/how/when to use and lost the calendar we previously provided  If doing well then f/u prn - if not first step is  To keep things simple, I have asked the patient to first separate medicines that are perceived as maintenance, that is to be taken daily "no matter what", from those medicines that are taken on only on an as-needed basis and I have given the patient examples of both, and then return to see our NP to generate a  detailed  medication calendar which should be followed until the next physician sees the patient and updates it.    ? Active sinus Dz > neg sinus CT  03/06/13> ent eval prn   ? Acid (or non-acid) GERD > always difficult to exclude as up to 75% of pts in some series report no assoc GI/ Heartburn symptoms> rec consider adding pepcid at hs

## 2014-04-01 DIAGNOSIS — J45901 Unspecified asthma with (acute) exacerbation: Secondary | ICD-10-CM | POA: Diagnosis not present

## 2014-04-01 DIAGNOSIS — I2510 Atherosclerotic heart disease of native coronary artery without angina pectoris: Secondary | ICD-10-CM | POA: Diagnosis not present

## 2014-04-01 DIAGNOSIS — I10 Essential (primary) hypertension: Secondary | ICD-10-CM | POA: Diagnosis not present

## 2014-04-01 DIAGNOSIS — IMO0001 Reserved for inherently not codable concepts without codable children: Secondary | ICD-10-CM | POA: Diagnosis not present

## 2014-04-01 DIAGNOSIS — F17200 Nicotine dependence, unspecified, uncomplicated: Secondary | ICD-10-CM | POA: Diagnosis not present

## 2014-04-01 DIAGNOSIS — J209 Acute bronchitis, unspecified: Secondary | ICD-10-CM | POA: Diagnosis not present

## 2014-04-03 DIAGNOSIS — J209 Acute bronchitis, unspecified: Secondary | ICD-10-CM | POA: Diagnosis not present

## 2014-04-03 DIAGNOSIS — I2510 Atherosclerotic heart disease of native coronary artery without angina pectoris: Secondary | ICD-10-CM | POA: Diagnosis not present

## 2014-04-03 DIAGNOSIS — J45901 Unspecified asthma with (acute) exacerbation: Secondary | ICD-10-CM | POA: Diagnosis not present

## 2014-04-03 DIAGNOSIS — IMO0001 Reserved for inherently not codable concepts without codable children: Secondary | ICD-10-CM | POA: Diagnosis not present

## 2014-04-03 DIAGNOSIS — I10 Essential (primary) hypertension: Secondary | ICD-10-CM | POA: Diagnosis not present

## 2014-04-03 DIAGNOSIS — F17200 Nicotine dependence, unspecified, uncomplicated: Secondary | ICD-10-CM | POA: Diagnosis not present

## 2014-04-07 DIAGNOSIS — J45901 Unspecified asthma with (acute) exacerbation: Secondary | ICD-10-CM | POA: Diagnosis not present

## 2014-04-07 DIAGNOSIS — I2510 Atherosclerotic heart disease of native coronary artery without angina pectoris: Secondary | ICD-10-CM | POA: Diagnosis not present

## 2014-04-07 DIAGNOSIS — J209 Acute bronchitis, unspecified: Secondary | ICD-10-CM | POA: Diagnosis not present

## 2014-04-07 DIAGNOSIS — IMO0001 Reserved for inherently not codable concepts without codable children: Secondary | ICD-10-CM | POA: Diagnosis not present

## 2014-04-07 DIAGNOSIS — F17200 Nicotine dependence, unspecified, uncomplicated: Secondary | ICD-10-CM | POA: Diagnosis not present

## 2014-04-07 DIAGNOSIS — I10 Essential (primary) hypertension: Secondary | ICD-10-CM | POA: Diagnosis not present

## 2014-04-10 DIAGNOSIS — I10 Essential (primary) hypertension: Secondary | ICD-10-CM | POA: Diagnosis not present

## 2014-04-10 DIAGNOSIS — I2510 Atherosclerotic heart disease of native coronary artery without angina pectoris: Secondary | ICD-10-CM | POA: Diagnosis not present

## 2014-04-10 DIAGNOSIS — J209 Acute bronchitis, unspecified: Secondary | ICD-10-CM | POA: Diagnosis not present

## 2014-04-10 DIAGNOSIS — J45901 Unspecified asthma with (acute) exacerbation: Secondary | ICD-10-CM | POA: Diagnosis not present

## 2014-04-10 DIAGNOSIS — F17200 Nicotine dependence, unspecified, uncomplicated: Secondary | ICD-10-CM | POA: Diagnosis not present

## 2014-04-10 DIAGNOSIS — IMO0001 Reserved for inherently not codable concepts without codable children: Secondary | ICD-10-CM | POA: Diagnosis not present

## 2014-04-10 DIAGNOSIS — J449 Chronic obstructive pulmonary disease, unspecified: Secondary | ICD-10-CM | POA: Diagnosis not present

## 2014-06-30 DIAGNOSIS — I129 Hypertensive chronic kidney disease with stage 1 through stage 4 chronic kidney disease, or unspecified chronic kidney disease: Secondary | ICD-10-CM | POA: Diagnosis not present

## 2014-06-30 DIAGNOSIS — M7130 Other bursal cyst, unspecified site: Secondary | ICD-10-CM | POA: Diagnosis not present

## 2014-06-30 DIAGNOSIS — N183 Chronic kidney disease, stage 3 unspecified: Secondary | ICD-10-CM | POA: Diagnosis not present

## 2014-06-30 DIAGNOSIS — M25569 Pain in unspecified knee: Secondary | ICD-10-CM | POA: Diagnosis not present

## 2014-06-30 DIAGNOSIS — Z79899 Other long term (current) drug therapy: Secondary | ICD-10-CM | POA: Diagnosis not present

## 2014-07-28 ENCOUNTER — Other Ambulatory Visit: Payer: Self-pay

## 2014-07-28 MED ORDER — LOSARTAN POTASSIUM-HCTZ 50-12.5 MG PO TABS: 1.0000 | ORAL_TABLET | Freq: Every day | ORAL | Status: DC

## 2014-07-28 NOTE — Telephone Encounter (Signed)
Rx was sent to pharmacy electronically. 

## 2014-09-03 ENCOUNTER — Other Ambulatory Visit: Payer: Self-pay | Admitting: Oncology

## 2014-09-03 DIAGNOSIS — Z9012 Acquired absence of left breast and nipple: Secondary | ICD-10-CM

## 2014-09-03 DIAGNOSIS — Z1231 Encounter for screening mammogram for malignant neoplasm of breast: Secondary | ICD-10-CM

## 2014-09-07 ENCOUNTER — Ambulatory Visit: Payer: Medicare Other | Admitting: Adult Health

## 2014-09-07 ENCOUNTER — Telehealth: Payer: Self-pay | Admitting: Adult Health

## 2014-09-07 ENCOUNTER — Other Ambulatory Visit: Payer: Medicare Other

## 2014-09-11 ENCOUNTER — Encounter: Payer: Self-pay | Admitting: Adult Health

## 2014-09-11 ENCOUNTER — Telehealth: Payer: Self-pay | Admitting: Adult Health

## 2014-09-11 ENCOUNTER — Other Ambulatory Visit: Payer: Self-pay | Admitting: Adult Health

## 2014-09-11 ENCOUNTER — Ambulatory Visit (HOSPITAL_BASED_OUTPATIENT_CLINIC_OR_DEPARTMENT_OTHER): Payer: Medicare Other | Admitting: Adult Health

## 2014-09-11 ENCOUNTER — Other Ambulatory Visit (HOSPITAL_BASED_OUTPATIENT_CLINIC_OR_DEPARTMENT_OTHER): Payer: Medicare Other

## 2014-09-11 VITALS — BP 109/66 | HR 83 | Temp 97.6°F | Resp 18 | Ht 60.0 in | Wt 133.4 lb

## 2014-09-11 DIAGNOSIS — C50111 Malignant neoplasm of central portion of right female breast: Secondary | ICD-10-CM | POA: Diagnosis not present

## 2014-09-11 DIAGNOSIS — N6459 Other signs and symptoms in breast: Secondary | ICD-10-CM

## 2014-09-11 DIAGNOSIS — C50912 Malignant neoplasm of unspecified site of left female breast: Secondary | ICD-10-CM

## 2014-09-11 DIAGNOSIS — C50919 Malignant neoplasm of unspecified site of unspecified female breast: Secondary | ICD-10-CM

## 2014-09-11 DIAGNOSIS — Z9012 Acquired absence of left breast and nipple: Secondary | ICD-10-CM

## 2014-09-11 LAB — COMPREHENSIVE METABOLIC PANEL (CC13)
ALBUMIN: 3.8 g/dL (ref 3.5–5.0)
ALT: 11 U/L (ref 0–55)
ANION GAP: 9 meq/L (ref 3–11)
AST: 21 U/L (ref 5–34)
Alkaline Phosphatase: 48 U/L (ref 40–150)
BILIRUBIN TOTAL: 0.53 mg/dL (ref 0.20–1.20)
BUN: 20.7 mg/dL (ref 7.0–26.0)
CALCIUM: 9.4 mg/dL (ref 8.4–10.4)
CO2: 28 meq/L (ref 22–29)
Chloride: 104 mEq/L (ref 98–109)
Creatinine: 0.9 mg/dL (ref 0.6–1.1)
Glucose: 84 mg/dl (ref 70–140)
POTASSIUM: 4 meq/L (ref 3.5–5.1)
SODIUM: 142 meq/L (ref 136–145)
TOTAL PROTEIN: 6.4 g/dL (ref 6.4–8.3)

## 2014-09-11 LAB — CBC WITH DIFFERENTIAL/PLATELET
BASO%: 1.2 % (ref 0.0–2.0)
Basophils Absolute: 0.1 10*3/uL (ref 0.0–0.1)
EOS%: 6.1 % (ref 0.0–7.0)
Eosinophils Absolute: 0.3 10*3/uL (ref 0.0–0.5)
HCT: 37.3 % (ref 34.8–46.6)
HGB: 12.1 g/dL (ref 11.6–15.9)
LYMPH%: 19.7 % (ref 14.0–49.7)
MCH: 30.3 pg (ref 25.1–34.0)
MCHC: 32.5 g/dL (ref 31.5–36.0)
MCV: 93.3 fL (ref 79.5–101.0)
MONO#: 0.4 10*3/uL (ref 0.1–0.9)
MONO%: 9.2 % (ref 0.0–14.0)
NEUT#: 2.9 10*3/uL (ref 1.5–6.5)
NEUT%: 63.8 % (ref 38.4–76.8)
Platelets: 132 10*3/uL — ABNORMAL LOW (ref 145–400)
RBC: 4 10*6/uL (ref 3.70–5.45)
RDW: 12.9 % (ref 11.2–14.5)
WBC: 4.5 10*3/uL (ref 3.9–10.3)
lymph#: 0.9 10*3/uL (ref 0.9–3.3)

## 2014-09-11 NOTE — Progress Notes (Signed)
[place]  Telephone:(336) [phone] [phone]) [phone]  OFFICE PROGRESS NOTE  PATIENT: Gloria [name]   DOB: 20-Feb-1928  MR#: [id number]  W[facility]#:[id number]  PN:[id number],MGQ [name], MD [name], MD [name]. [name], MD  DIAGNOSIS:  An 80 year old [place] woman with invasive lobular carcinoma of the left breast diagnosed in 07/2010.  PRIOR THERAPY: 1. The patient noted retraction of her nipple for 3 months prior on the left breast and underwent a mammogram on 08/03/2010 with an ultrasound. Exam showed left nipple retraction, and the palpable mass subareolar left breast at the 6:00 position. Ultrasound confirmed the presence of a mass measuring 2.6 x 1.7 x 2.4 cm.  2. A biopsy was performed on 08/04/2010 which path followed she showed to be an invasive mammary carcinoma with lobular features. ER 95%, PR 97%, Ki-67 17%, HER-2/neu was non amplified with a ratio of 1.  3. An MRI on 08/12/19,011 showed the mass to be larger at 4.6 x 4.2 x 2.3 cm with enhancement distortion extending to the nipple retraction. There is questionable anterior mediastinal lymph node seen.  4. Neoadjuvant antiestrogen therapy with Femara started in 07/2010.  5. Status post left breast mastectomy with sentinel node biopsy on 09/03/2010.  6. The patient was started on antiestrogen therapy with tamoxifen 08/2010.  7. Status post radiation therapy from 12/03/2010 through 01/23/2011.   CURRENT THERAPY:  Tamoxifen 20 mg by mouth daily since 08/2010.   INTERVAL HISTORY:  Gloria [name] is here today for f/u of her left breast cancer.  She is doing well today.  She is taking Tamoxifen daily and tolerating it well.  She does have vaginal yellow discharge.  She has been evaluated by gynecology due to this.  She denies any other difficulty taking the tamoxifen.  She does have pain over the left mastectomy site and says, "this area that Dr. Marlou Starks found is growing."  This has been going on since 2014.  She says she  was told by imaging when she was having her left mastectomy site evaluated she was told not to see Dr. Marlou Starks because he was a Psychologist, sport and exercise and all he did was operate.  She has had serial ultrasounds of this area every 6 months and is concerned that this area is growing.    PAST MEDICAL HISTORY: Past Medical History  Diagnosis Date  . Cancer     breast- left  . GERD (gastroesophageal reflux disease)   . Hypertension   . Anxiety   . Depression   . Blood in urine   . Knee fracture   . CAD (coronary artery disease)     non obstructive by cath  . TR (tricuspid regurgitation)     mild by Echo 12/2008 EF >55%    PAST SURGICAL HISTORY: Past Surgical History  Procedure Laterality Date  . Tonsillectomy    . Breast surgery  08-30-10    mastectomy  . Colon surgery    . Abdominal hysterectomy    . Cardiac catheterization  08/2011    20% LAD stenosis, 20% diagonal stenosis, 10-20% proximal dominant RCA stenosis    FAMILY HISTORY: Family History  Problem Relation Age of Onset  . Cancer Father     prostate  . Cancer Sister     breast  . Heart disease Sister   . Cancer Brother     pancreatic  . Cancer Maternal Grandmother     colon  . Dementia Mother   . Sudden death Brother   . Heart attack  Brother   . Heart attack Brother   . Arthritis Sister     SOCIAL HISTORY: History  Substance Use Topics  . Smoking status: Former Smoker    Quit date: 11/15/1991  . Smokeless tobacco: Never Used  . Alcohol Use: 1.2 oz/week    2 Glasses of wine per week     Comment: 1 glass per week    ALLERGIES: Allergies  Allergen Reactions  . Contrast Media [Iodinated Diagnostic Agents] Anaphylaxis  . Adhesive [Tape] Other (See Comments)    blisters  . Cephalexin Swelling  . Ciprofloxacin Swelling  . Demerol Nausea And Vomiting  . Penicillins Swelling    REACTION: allergic to penicillin  . Sulfamethoxazole-Trimethoprim Itching and Swelling    REACTION: swelling/hives  . Quinolones Itching and  Rash    REACTION: itching, rash Pt. Reports no problems with levaquin     MEDICATIONS:  Current Outpatient Prescriptions  Medication Sig Dispense Refill  . ALPRAZolam (XANAX) 0.25 MG tablet Take 0.25 mg by mouth at bedtime.     Marland Kitchen aspirin 81 MG chewable tablet Chew 81 mg by mouth daily.      . budesonide (PULMICORT) 0.5 MG/2ML nebulizer solution Take 2 mLs (0.5 mg total) by nebulization every 12 (twelve) hours. 12 mL 2  . Calcium Carbonate-Vitamin D (CALTRATE 600+D PO) Take 1 tablet by mouth daily.     . cetirizine (ZYRTEC) 10 MG tablet Take 10 mg by mouth daily.     Marland Kitchen doxepin (SINEQUAN) 10 MG capsule Take 10 mg by mouth at bedtime.     . fluticasone (FLONASE) 50 MCG/ACT nasal spray Place 1 spray into both nostrils daily as needed.     Marland Kitchen guaiFENesin (MUCINEX) 600 MG 12 hr tablet Take 1 to 2 every 12 hours as needed for cough/congestion/thick mucus    . losartan-hydrochlorothiazide (HYZAAR) 50-12.5 MG per tablet Take 1 tablet by mouth daily. 30 tablet 11  . MAGNESIUM PO Take 250 mg by mouth daily.     . tamoxifen (NOLVADEX) 20 MG tablet Take 1 tablet (20 mg total) by mouth daily. 30 tablet 5  . albuterol (PROAIR HFA) 108 (90 BASE) MCG/ACT inhaler Inhale 2 puffs into the lungs every 6 (six) hours as needed for wheezing or shortness of breath.    Marland Kitchen arformoterol (BROVANA) 15 MCG/2ML NEBU Take 2 mLs (15 mcg total) by nebulization every 12 (twelve) hours. 120 mL 2   No current facility-administered medications for this visit.      REVIEW OF SYSTEMS: A 10 point review of systems was completed and is negative except as noted above.   Health Maintenance  Mammogram: 09/2013   PHYSICAL EXAMINATION: BP 109/66 mmHg  Pulse 83  Temp(Src) 97.6 F (36.4 C) (Oral)  Resp 18  Ht 5' (1.524 m)  Wt 133 lb 6.4 oz (60.51 kg)  BMI 26.05 kg/m2  GENERAL: Patient is a well appearing female in no acute distress HEENT:  Sclerae anicteric.  Oropharynx clear and moist. No ulcerations or evidence of  oropharyngeal candidiasis. Neck is supple.  NODES:  No cervical, supraclavicular, or axillary lymphadenopathy palpated.  BREAST EXAM:  Thickening at medial area of left mastectomy scar, right breast no masses or nodules.   LUNGS:  Clear to auscultation bilaterally.  No wheezes or rhonchi. HEART:  Regular rate and rhythm. No murmur appreciated. ABDOMEN:  Soft, nontender.  Positive, normoactive bowel sounds. No organomegaly palpated. MSK:  No focal spinal tenderness to palpation. Full range of motion bilaterally in the upper extremities. EXTREMITIES:  No peripheral edema.   SKIN:  Clear with no obvious rashes or skin changes. No nail dyscrasia. NEURO:  Nonfocal. Well oriented.  Appropriate affect.  ECOG FS:  Grade 1 - Symptomatic but completely ambulatory   LAB RESULTS: Lab Results  Component Value Date   WBC 4.5 09/11/2014   NEUTROABS 2.9 09/11/2014   HGB 12.1 09/11/2014   HCT 37.3 09/11/2014   MCV 93.3 09/11/2014   PLT 132* 09/11/2014      Chemistry      Component Value Date/Time   NA 141 03/15/2014 1548   NA 142 03/04/2014 1000   K 3.7 03/15/2014 1548   K 4.1 03/04/2014 1000   CL 104 03/15/2014 1548   CL 105 04/03/2013 1410   CO2 27 03/15/2014 1548   CO2 27 03/04/2014 1000   BUN 19 03/15/2014 1548   BUN 18.6 03/04/2014 1000   CREATININE 0.86 03/15/2014 1548   CREATININE 1.0 03/04/2014 1000   CREATININE 0.98 05/06/2013 1515      Component Value Date/Time   CALCIUM 9.1 03/15/2014 1548   CALCIUM 9.1 03/04/2014 1000   ALKPHOS 62 03/04/2014 1000   ALKPHOS 46 05/14/2012 1139   AST 19 03/04/2014 1000   AST 19 05/14/2012 1139   ALT 18 03/04/2014 1000   ALT 10 05/14/2012 1139   BILITOT 0.39 03/04/2014 1000   BILITOT 0.4 05/14/2012 1139       Lab Results  Component Value Date   LABCA2 29 08/13/2012    RADIOGRAPHIC STUDIES: CLINICAL DATA: Left mastectomy 2011. Palpable left chest wall lump, unchanged on physical exam.  EXAM: ULTRASOUND OF THE LEFT  BREAST  COMPARISON: 10/03/2013  FINDINGS: On physical exam,I palpate a firm, readily mobile mass at the mastectomy site along the midclavicular line. Sonographically, an oval hypoechoic mass with central increased echogenicity is seen measuring 0.5 x 0.5 x 0.4 cm compatible with a suture granuloma.  IMPRESSION: Benign findings, left chest wall.  RECOMMENDATION: The patient is due for screening mammogram in December, 2015.  I have discussed the findings and recommendations with the patient. Results were also provided in writing at the conclusion of the visit. If applicable, a reminder letter will be sent to the patient regarding the next appointment.  BI-RADS CATEGORY 2: Benign.   Electronically Signed  By: Duke Salvia M.D.  On: 03/24/2014 14:26  ASSESSMENT/PLAN: 80 y.o. with:  Stage IIIB, T2 N1 invasive lobular carcinoma, grade 2, ER 95%, PR 97%, Ki-67 17%, HER-2/neu no amplification. Status post left breast mastectomy with sentinel biopsy on 09/03/2010. Status post radiation therapy that was completed on 01/23/2011. The patient states she declined chemotherapy. She was started on tamoxifen 20 mg daily. She is tolerating the tamoxifen well and will continue this.  She will undergo repeat ultrasound of her left chest wall, and see Dr. Marlou Starks as soon as possible as well.  She will f/u with [name] in a couple months to discuss her plan of care.  I discussed this in detail with her.  She is in agreement with the plan as discussed.    All questions were answered.  The patient was encouraged to contact us in the interim with any problems, questions or concerns.   I spent 25 minutes counseling the patient face to face.  The total time spent in the appointment was 30 minutes.   Minette Headland, [place] [phone]

## 2014-09-11 NOTE — Telephone Encounter (Signed)
, °

## 2014-09-14 ENCOUNTER — Other Ambulatory Visit: Payer: Self-pay | Admitting: Adult Health

## 2014-09-14 ENCOUNTER — Other Ambulatory Visit: Payer: Self-pay

## 2014-09-14 DIAGNOSIS — Z9012 Acquired absence of left breast and nipple: Secondary | ICD-10-CM

## 2014-09-14 DIAGNOSIS — C50912 Malignant neoplasm of unspecified site of left female breast: Secondary | ICD-10-CM

## 2014-09-14 DIAGNOSIS — N6459 Other signs and symptoms in breast: Secondary | ICD-10-CM

## 2014-09-15 ENCOUNTER — Ambulatory Visit
Admission: RE | Admit: 2014-09-15 | Discharge: 2014-09-15 | Disposition: A | Discharge status: Home / Self Care | Payer: Medicare Other | Source: Ambulatory Visit | Attending: Adult Health | Admitting: Adult Health

## 2014-09-15 DIAGNOSIS — Z9012 Acquired absence of left breast and nipple: Secondary | ICD-10-CM

## 2014-09-15 DIAGNOSIS — N63 Unspecified lump in breast: Secondary | ICD-10-CM | POA: Diagnosis not present

## 2014-09-15 DIAGNOSIS — N6459 Other signs and symptoms in breast: Secondary | ICD-10-CM

## 2014-09-15 DIAGNOSIS — Z853 Personal history of malignant neoplasm of breast: Secondary | ICD-10-CM | POA: Diagnosis not present

## 2014-09-15 DIAGNOSIS — C50912 Malignant neoplasm of unspecified site of left female breast: Secondary | ICD-10-CM

## 2014-09-17 ENCOUNTER — Other Ambulatory Visit (INDEPENDENT_AMBULATORY_CARE_PROVIDER_SITE_OTHER): Payer: Self-pay | Admitting: General Surgery

## 2014-09-17 DIAGNOSIS — C50912 Malignant neoplasm of unspecified site of left female breast: Secondary | ICD-10-CM | POA: Diagnosis not present

## 2014-09-17 DIAGNOSIS — R2232 Localized swelling, mass and lump, left upper limb: Secondary | ICD-10-CM

## 2014-09-18 DIAGNOSIS — R309 Painful micturition, unspecified: Secondary | ICD-10-CM | POA: Diagnosis not present

## 2014-09-21 ENCOUNTER — Ambulatory Visit
Admission: RE | Admit: 2014-09-21 | Discharge: 2014-09-21 | Disposition: A | Discharge status: Home / Self Care | Payer: Medicare Other | Source: Ambulatory Visit | Attending: General Surgery | Admitting: General Surgery

## 2014-09-21 ENCOUNTER — Other Ambulatory Visit (INDEPENDENT_AMBULATORY_CARE_PROVIDER_SITE_OTHER): Payer: Self-pay | Admitting: General Surgery

## 2014-09-21 DIAGNOSIS — N6002 Solitary cyst of left breast: Secondary | ICD-10-CM | POA: Diagnosis not present

## 2014-09-21 DIAGNOSIS — R2232 Localized swelling, mass and lump, left upper limb: Secondary | ICD-10-CM

## 2014-09-21 DIAGNOSIS — Z853 Personal history of malignant neoplasm of breast: Secondary | ICD-10-CM | POA: Diagnosis not present

## 2014-09-21 DIAGNOSIS — N63 Unspecified lump in breast: Secondary | ICD-10-CM | POA: Diagnosis not present

## 2014-10-07 ENCOUNTER — Other Ambulatory Visit: Payer: Self-pay | Admitting: Oncology

## 2014-10-07 ENCOUNTER — Ambulatory Visit
Admission: RE | Admit: 2014-10-07 | Discharge: 2014-10-07 | Disposition: A | Discharge status: Home / Self Care | Payer: Medicare Other | Source: Ambulatory Visit | Attending: Oncology | Admitting: Oncology

## 2014-10-07 DIAGNOSIS — Z9012 Acquired absence of left breast and nipple: Secondary | ICD-10-CM

## 2014-10-07 DIAGNOSIS — Z1231 Encounter for screening mammogram for malignant neoplasm of breast: Secondary | ICD-10-CM | POA: Diagnosis not present

## 2014-10-08 ENCOUNTER — Encounter (HOSPITAL_COMMUNITY): Payer: Self-pay | Admitting: Cardiovascular Disease

## 2014-10-10 ENCOUNTER — Other Ambulatory Visit: Payer: Self-pay | Admitting: Oncology

## 2014-10-10 DIAGNOSIS — C50912 Malignant neoplasm of unspecified site of left female breast: Secondary | ICD-10-CM

## 2014-10-19 DIAGNOSIS — N390 Urinary tract infection, site not specified: Secondary | ICD-10-CM | POA: Diagnosis not present

## 2014-10-27 ENCOUNTER — Telehealth: Payer: Self-pay | Admitting: Hematology

## 2014-10-27 NOTE — Telephone Encounter (Signed)
moved 1/13 appt to AM due to call day. s/w pt she is aware.

## 2014-10-27 NOTE — Telephone Encounter (Signed)
add to previous note. pt f/u is 1/14. s/w pt and confirmed appt is 1/14.

## 2014-11-02 DIAGNOSIS — I10 Essential (primary) hypertension: Secondary | ICD-10-CM | POA: Diagnosis not present

## 2014-11-02 DIAGNOSIS — R040 Epistaxis: Secondary | ICD-10-CM | POA: Diagnosis not present

## 2014-11-02 DIAGNOSIS — N390 Urinary tract infection, site not specified: Secondary | ICD-10-CM | POA: Diagnosis not present

## 2014-11-12 ENCOUNTER — Telehealth: Payer: Self-pay | Admitting: Hematology

## 2014-11-12 ENCOUNTER — Ambulatory Visit (HOSPITAL_BASED_OUTPATIENT_CLINIC_OR_DEPARTMENT_OTHER): Payer: Medicare Other | Admitting: Hematology

## 2014-11-12 ENCOUNTER — Ambulatory Visit (HOSPITAL_BASED_OUTPATIENT_CLINIC_OR_DEPARTMENT_OTHER): Payer: Medicare Other

## 2014-11-12 VITALS — BP 110/81 | HR 75 | Temp 97.8°F | Resp 18 | Ht 60.0 in | Wt 131.9 lb

## 2014-11-12 DIAGNOSIS — K5900 Constipation, unspecified: Secondary | ICD-10-CM

## 2014-11-12 DIAGNOSIS — Z170 Estrogen receptor positive status [ER+]: Secondary | ICD-10-CM | POA: Diagnosis not present

## 2014-11-12 DIAGNOSIS — C50912 Malignant neoplasm of unspecified site of left female breast: Secondary | ICD-10-CM

## 2014-11-12 DIAGNOSIS — R079 Chest pain, unspecified: Secondary | ICD-10-CM | POA: Diagnosis not present

## 2014-11-12 DIAGNOSIS — C50012 Malignant neoplasm of nipple and areola, left female breast: Secondary | ICD-10-CM

## 2014-11-12 LAB — CBC WITH DIFFERENTIAL/PLATELET
BASO%: 1.1 % (ref 0.0–2.0)
Basophils Absolute: 0.1 10*3/uL (ref 0.0–0.1)
EOS ABS: 0.3 10*3/uL (ref 0.0–0.5)
EOS%: 5.9 % (ref 0.0–7.0)
HEMATOCRIT: 37 % (ref 34.8–46.6)
HEMOGLOBIN: 12.1 g/dL (ref 11.6–15.9)
LYMPH#: 0.8 10*3/uL — AB (ref 0.9–3.3)
LYMPH%: 17.4 % (ref 14.0–49.7)
MCH: 30.9 pg (ref 25.1–34.0)
MCHC: 32.7 g/dL (ref 31.5–36.0)
MCV: 94.4 fL (ref 79.5–101.0)
MONO#: 0.5 10*3/uL (ref 0.1–0.9)
MONO%: 11.4 % (ref 0.0–14.0)
NEUT#: 2.8 10*3/uL (ref 1.5–6.5)
NEUT%: 64.2 % (ref 38.4–76.8)
PLATELETS: 123 10*3/uL — AB (ref 145–400)
RBC: 3.92 10*6/uL (ref 3.70–5.45)
RDW: 13.4 % (ref 11.2–14.5)
WBC: 4.4 10*3/uL (ref 3.9–10.3)

## 2014-11-12 LAB — COMPREHENSIVE METABOLIC PANEL (CC13)
ALBUMIN: 3.5 g/dL (ref 3.5–5.0)
ALT: 9 U/L (ref 0–55)
ANION GAP: 8 meq/L (ref 3–11)
AST: 19 U/L (ref 5–34)
Alkaline Phosphatase: 45 U/L (ref 40–150)
BUN: 14.7 mg/dL (ref 7.0–26.0)
CALCIUM: 8.4 mg/dL (ref 8.4–10.4)
CO2: 26 meq/L (ref 22–29)
Chloride: 108 mEq/L (ref 98–109)
Creatinine: 0.9 mg/dL (ref 0.6–1.1)
EGFR: 63 mL/min/{1.73_m2} — AB (ref >90)
GLUCOSE: 88 mg/dL (ref 70–140)
POTASSIUM: 4 meq/L (ref 3.5–5.1)
Sodium: 142 mEq/L (ref 136–145)
TOTAL PROTEIN: 5.8 g/dL — AB (ref 6.4–8.3)
Total Bilirubin: 0.47 mg/dL (ref 0.20–1.20)

## 2014-11-12 NOTE — Telephone Encounter (Signed)
gv adn printed appt sched and avs for pt for Jan...gv pt barium

## 2014-11-12 NOTE — Progress Notes (Signed)
[place]  Telephone:([phone] Fax:(336) [phone]  OFFICE PROGRESS NOTE  PATIENT: Gloria [name]   DOB: 06/28/1928  MR#: [id number]  IWP#:[id number]  NK:[id number],PFX [name], MD [name], MD [name]. [name], MD  DIAGNOSIS:  An 81 year old [place] woman with invasive lobular carcinoma of the left breast diagnosed in 07/2010.  PRIOR THERAPY: 1. The patient noted retraction of her nipple for 3 months prior on the left breast and underwent a mammogram on 08/03/2010 with an ultrasound. Exam showed left nipple retraction, and the palpable mass subareolar left breast at the 6:00 position. Ultrasound confirmed the presence of a mass measuring 2.6 x 1.7 x 2.4 cm.  2. A biopsy was performed on 08/04/2010 which path followed she showed to be an invasive mammary carcinoma with lobular features. ER 95%, PR 97%, Ki-67 17%, HER-2/neu was non amplified with a ratio of 1.  3. An MRI on 08/12/19,011 showed the mass to be larger at 4.6 x 4.2 x 2.3 cm with enhancement distortion extending to the nipple retraction. There is questionable anterior mediastinal lymph node seen.  4. Neoadjuvant antiestrogen therapy with Femara started in 07/2010.  5. Status post left breast mastectomy with sentinel node biopsy on 09/03/2010.  6. The patient was started on antiestrogen therapy with tamoxifen 08/2010.  7. Status post radiation therapy from 12/03/2010 through 01/23/2011.   CURRENT THERAPY:  Tamoxifen 20 mg by mouth daily since 08/2010.   INTERVAL HISTORY:  Gloria [name] is here today for f/u of her left breast cancer.  She has been having left side chest pain after eating or drinking for the past 3 days, and she  also reports intermittent upper gastric discomfort  in the past few years. She denies any nausea, bloating, diarrhea or obstipation.  Her appetite is fair and stable, no recent weight loss.   PAST MEDICAL HISTORY: Past Medical History  Diagnosis Date  . Cancer     breast- left   . GERD (gastroesophageal reflux disease)   . Hypertension   . Anxiety   . Depression   . Blood in urine   . Knee fracture   . CAD (coronary artery disease)     non obstructive by cath  . TR (tricuspid regurgitation)     mild by Echo 12/2008 EF >55%    PAST SURGICAL HISTORY: Past Surgical History  Procedure Laterality Date  . Tonsillectomy    . Breast surgery  08-30-10    mastectomy  . Colon surgery    . Abdominal hysterectomy    . Cardiac catheterization  08/2011    20% LAD stenosis, 20% diagonal stenosis, 10-20% proximal dominant RCA stenosis  . Left heart catheterization with coronary angiogram N/A 09/08/2011    Procedure: LEFT HEART CATHETERIZATION WITH CORONARY ANGIOGRAM;  Surgeon: Troy Sine, MD;  Location: [facility] CATH LAB;  Service: Cardiovascular;  Laterality: N/A;  . Abdominal aortagram N/A 09/08/2011    Procedure: ABDOMINAL Maxcine Ham;  Surgeon: Troy Sine, MD;  Location: [facility] CATH LAB;  Service: Cardiovascular;  Laterality: N/A;    FAMILY HISTORY: Family History  Problem Relation Age of Onset  . Cancer Father     prostate  . Cancer Sister     breast  . Heart disease Sister   . Cancer Brother     pancreatic  . Cancer Maternal Grandmother     colon  . Dementia Mother   . Sudden death Brother   . Heart attack Brother   . Heart attack Brother   .  Arthritis Sister     SOCIAL HISTORY: History  Substance Use Topics  . Smoking status: Former Smoker    Quit date: 11/15/1991  . Smokeless tobacco: Never Used  . Alcohol Use: 1.2 oz/week    2 Glasses of wine per week     Comment: 1 glass per week    ALLERGIES: Allergies  Allergen Reactions  . Contrast Media [Iodinated Diagnostic Agents] Anaphylaxis  . Adhesive [Tape] Other (See Comments)    blisters  . Cephalexin Swelling  . Ciprofloxacin Swelling  . Demerol Nausea And Vomiting  . Penicillins Swelling    REACTION: allergic to penicillin  . Sulfamethoxazole-Trimethoprim Itching and Swelling     REACTION: swelling/hives  . Quinolones Itching and Rash    REACTION: itching, rash Pt. Reports no problems with levaquin     MEDICATIONS:  Current Outpatient Prescriptions  Medication Sig Dispense Refill  . albuterol (PROAIR HFA) 108 (90 BASE) MCG/ACT inhaler Inhale 2 puffs into the lungs every 6 (six) hours as needed for wheezing or shortness of breath.    . ALPRAZolam (XANAX) 0.25 MG tablet Take 0.25 mg by mouth at bedtime.     Marland Kitchen arformoterol (BROVANA) 15 MCG/2ML NEBU Take 2 mLs (15 mcg total) by nebulization every 12 (twelve) hours. 120 mL 2  . aspirin 81 MG chewable tablet Chew 81 mg by mouth daily.      . budesonide (PULMICORT) 0.5 MG/2ML nebulizer solution Take 2 mLs (0.5 mg total) by nebulization every 12 (twelve) hours. 12 mL 2  . Calcium Carbonate-Vitamin D (CALTRATE 600+D PO) Take 1 tablet by mouth as needed.     . cetirizine (ZYRTEC) 10 MG tablet Take 10 mg by mouth daily.     Marland Kitchen doxepin (SINEQUAN) 10 MG capsule Take 10 mg by mouth at bedtime.     . fluticasone (FLONASE) 50 MCG/ACT nasal spray Place 1 spray into both nostrils daily as needed.     Marland Kitchen guaiFENesin (MUCINEX) 600 MG 12 hr tablet Take 1 to 2 every 12 hours as needed for cough/congestion/thick mucus    . losartan-hydrochlorothiazide (HYZAAR) 50-12.5 MG per tablet Take 1 tablet by mouth daily. Takes Losartan - HCTZ  25-12.65m tablet daily.  1  . tamoxifen (NOLVADEX) 20 MG tablet take 1 tablet by mouth once daily 30 tablet 0  . MAGNESIUM PO Take 250 mg by mouth daily.      No current facility-administered medications for this visit.      REVIEW OF SYSTEMS: A 10 point review of systems was completed and is negative except as noted above.   Health Maintenance  Mammogram: 09/2013   PHYSICAL EXAMINATION: BP 110/81 mmHg  Pulse 75  Temp(Src) 97.8 F (36.6 C) (Oral)  Resp 18  Ht 5' (1.524 m)  Wt 131 lb 14.4 oz (59.829 kg)  BMI 25.76 kg/m2  SpO2 98%  GENERAL: Patient is a well appearing female in no acute  distress HEENT:  Sclerae anicteric.  Oropharynx clear and moist. No ulcerations or evidence of oropharyngeal candidiasis. Neck is supple.  NODES:   There is a 1.5-2 cm firm nodule in the left axilla, movable, nontender. No other cervical , supraclavicular, or axillary lymphadenopathy palpated.  BREAST EXAM:  Thickening at medial area of left mastectomy scar, right breast no masses or nodules.   LUNGS:  Clear to auscultation bilaterally.  No wheezes or rhonchi. HEART:  Regular rate and rhythm. No murmur appreciated. ABDOMEN:  Soft, nontender.  Positive, normoactive bowel sounds. No organomegaly palpated. MSK:  No  focal spinal tenderness to palpation. Full range of motion bilaterally in the upper extremities. EXTREMITIES:  No peripheral edema.   SKIN:  Clear with no obvious rashes or skin changes. No nail dyscrasia. NEURO:  Nonfocal. Well oriented.  Appropriate affect.  ECOG FS:  Grade 1 - Symptomatic but completely ambulatory   LAB RESULTS: Lab Results  Component Value Date   WBC 4.5 09/11/2014   NEUTROABS 2.9 09/11/2014   HGB 12.1 09/11/2014   HCT 37.3 09/11/2014   MCV 93.3 09/11/2014   PLT 132* 09/11/2014      Chemistry      Component Value Date/Time   NA 142 09/11/2014 1252   NA 141 03/15/2014 1548   K 4.0 09/11/2014 1252   K 3.7 03/15/2014 1548   CL 104 03/15/2014 1548   CL 105 04/03/2013 1410   CO2 28 09/11/2014 1252   CO2 27 03/15/2014 1548   BUN 20.7 09/11/2014 1252   BUN 19 03/15/2014 1548   CREATININE 0.9 09/11/2014 1252   CREATININE 0.86 03/15/2014 1548   CREATININE 0.98 05/06/2013 1515      Component Value Date/Time   CALCIUM 9.4 09/11/2014 1252   CALCIUM 9.1 03/15/2014 1548   ALKPHOS 48 09/11/2014 1252   ALKPHOS 46 05/14/2012 1139   AST 21 09/11/2014 1252   AST 19 05/14/2012 1139   ALT 11 09/11/2014 1252   ALT 10 05/14/2012 1139   BILITOT 0.53 09/11/2014 1252   BILITOT 0.4 05/14/2012 1139       Lab Results  Component Value Date   LABCA2 29  08/13/2012    RADIOGRAPHIC STUDIES: CLINICAL DATA: Left mastectomy 2011. Palpable left chest wall lump, unchanged on physical exam.  EXAM: ULTRASOUND OF THE LEFT BREAST  COMPARISON: 10/03/2013  FINDINGS: On physical exam,I palpate a firm, readily mobile mass at the mastectomy site along the midclavicular line. Sonographically, an oval hypoechoic mass with central increased echogenicity is seen measuring 0.5 x 0.5 x 0.4 cm compatible with a suture granuloma.  IMPRESSION: Benign findings, left chest wall.  RECOMMENDATION: The patient is due for screening mammogram in December, 2015.  I have discussed the findings and recommendations with the patient. Results were also provided in writing at the conclusion of the visit. If applicable, a reminder letter will be sent to the patient regarding the next appointment.  BI-RADS CATEGORY 2: Benign.   Electronically Signed  By: Duke Salvia M.D.  On: 03/24/2014 14:26  ASSESSMENT/PLAN: 81 y.o. with:  1. Stage IIIB, T2 N1 invasive lobular carcinoma, grade 2, ER 95%, PR 97%, Ki-67 17%, HER-2/neu no amplification.  -Status post left breast mastectomy with sentinel biopsy on 09/03/2010. Status post radiation therapy that was completed on 01/23/2011. The patient states she declined chemotherapy.  -She was started on tamoxifen 20 mg daily. She is tolerating the tamoxifen well and will continue this.  -She is very concerned about her presentation chest pain and persistent epigastric pain, I'll obtain a CT of the chest abdomen and pelvis to ruled out cancer recurrence.  2. Left chest and epigastric pain -Her left chest pain is related to eating, I suggest her to take Nexium before meal to see if it helps. -We'll obtain a CT study for further evaluation. -She also has a mild constipation, I suggest her to use Colace. -Repeat lab today.   I spent 25 minutes counseling the patient face to face.  The total time spent in the  appointment was 30 minutes.  Plan -use nexium and colace  -CT chest/abd/pelvis  with iv contrast -lab today -RTC in 2 weeks

## 2014-11-15 ENCOUNTER — Encounter: Payer: Self-pay | Admitting: Hematology

## 2014-11-19 ENCOUNTER — Ambulatory Visit (HOSPITAL_COMMUNITY)
Admission: RE | Admit: 2014-11-19 | Discharge: 2014-11-19 | Disposition: A | Discharge status: Home / Self Care | Payer: Medicare Other | Source: Ambulatory Visit | Attending: Hematology | Admitting: Hematology

## 2014-11-19 ENCOUNTER — Encounter (HOSPITAL_COMMUNITY): Payer: Self-pay

## 2014-11-19 DIAGNOSIS — C50912 Malignant neoplasm of unspecified site of left female breast: Secondary | ICD-10-CM | POA: Insufficient documentation

## 2014-11-19 MED ORDER — IOHEXOL 300 MG/ML  SOLN: 80.0000 mL | Freq: Once | INTRAMUSCULAR | Status: AC | PRN

## 2014-11-20 ENCOUNTER — Other Ambulatory Visit: Payer: Self-pay | Admitting: Hematology

## 2014-11-20 DIAGNOSIS — C50912 Malignant neoplasm of unspecified site of left female breast: Secondary | ICD-10-CM

## 2014-11-23 ENCOUNTER — Other Ambulatory Visit: Payer: Self-pay

## 2014-11-23 DIAGNOSIS — N390 Urinary tract infection, site not specified: Secondary | ICD-10-CM | POA: Diagnosis not present

## 2014-11-23 DIAGNOSIS — C50912 Malignant neoplasm of unspecified site of left female breast: Secondary | ICD-10-CM

## 2014-11-23 DIAGNOSIS — R3915 Urgency of urination: Secondary | ICD-10-CM | POA: Diagnosis not present

## 2014-11-23 MED ORDER — TAMOXIFEN CITRATE 20 MG PO TABS: 20.0000 mg | ORAL_TABLET | Freq: Every day | ORAL | Status: DC

## 2014-11-26 ENCOUNTER — Telehealth: Payer: Self-pay | Admitting: Hematology

## 2014-11-26 ENCOUNTER — Ambulatory Visit (HOSPITAL_BASED_OUTPATIENT_CLINIC_OR_DEPARTMENT_OTHER): Payer: Medicare Other | Admitting: Hematology

## 2014-11-26 VITALS — BP 133/67 | HR 75 | Temp 97.5°F | Resp 18 | Ht 60.0 in | Wt 133.1 lb

## 2014-11-26 DIAGNOSIS — Z79810 Long term (current) use of selective estrogen receptor modulators (SERMs): Secondary | ICD-10-CM

## 2014-11-26 DIAGNOSIS — C50912 Malignant neoplasm of unspecified site of left female breast: Secondary | ICD-10-CM | POA: Diagnosis not present

## 2014-11-26 NOTE — Telephone Encounter (Signed)
, °

## 2014-11-26 NOTE — Progress Notes (Signed)
[place]  Telephone:(336) [phone] Fax:(336) [phone]  OFFICE PROGRESS NOTE  PATIENT: Gloria [name]   DOB: 1927-10-31  MR#: [id number]  VQM#:[id number]  DT:[id number],DXI [name], MD [name], MD [name]. [name], MD  DIAGNOSIS:  An 81 year old [place] woman with invasive lobular carcinoma of the left breast diagnosed in 07/2010.  PRIOR THERAPY: 1. The patient noted retraction of her nipple for 3 months prior on the left breast and underwent a mammogram on 08/03/2010 with an ultrasound. Exam showed left nipple retraction, and the palpable mass subareolar left breast at the 6:00 position. Ultrasound confirmed the presence of a mass measuring 2.6 x 1.7 x 2.4 cm.  2. A biopsy was performed on 08/04/2010 which path followed she showed to be an invasive mammary carcinoma with lobular features. ER 95%, PR 97%, Ki-67 17%, HER-2/neu was non amplified with a ratio of 1.  3. An MRI on 08/12/19,011 showed the mass to be larger at 4.6 x 4.2 x 2.3 cm with enhancement distortion extending to the nipple retraction. There is questionable anterior mediastinal lymph node seen.  4. Neoadjuvant antiestrogen therapy with Femara started in 07/2010.  5. Status post left breast mastectomy with sentinel node biopsy on 09/03/2010.  6. The patient was started on antiestrogen therapy with tamoxifen 08/2010.  7. Status post radiation therapy from 12/03/2010 through 01/23/2011.   CURRENT THERAPY:  Tamoxifen 20 mg by mouth daily since 08/2010.   INTERVAL HISTORY:  Gloria [name] is here today for f/u and discuss her recent CT findings. Her chest pain and epigastric pain has improved with prosec and colace. No other new complains.    PAST MEDICAL HISTORY: Past Medical History  Diagnosis Date  . Cancer     breast- left  . GERD (gastroesophageal reflux disease)   . Hypertension   . Anxiety   . Depression   . Blood in urine   . Knee fracture   . CAD (coronary artery disease)     non  obstructive by cath  . TR (tricuspid regurgitation)     mild by Echo 12/2008 EF >55%    PAST SURGICAL HISTORY: Past Surgical History  Procedure Laterality Date  . Tonsillectomy    . Breast surgery  08-30-10    mastectomy  . Colon surgery    . Abdominal hysterectomy    . Cardiac catheterization  08/2011    20% LAD stenosis, 20% diagonal stenosis, 10-20% proximal dominant RCA stenosis  . Left heart catheterization with coronary angiogram N/A 09/08/2011    Procedure: LEFT HEART CATHETERIZATION WITH CORONARY ANGIOGRAM;  Surgeon: Troy Sine, MD;  Location: [facility] CATH LAB;  Service: Cardiovascular;  Laterality: N/A;  . Abdominal aortagram N/A 09/08/2011    Procedure: ABDOMINAL Maxcine Ham;  Surgeon: Troy Sine, MD;  Location: [facility] CATH LAB;  Service: Cardiovascular;  Laterality: N/A;    FAMILY HISTORY: Family History  Problem Relation Age of Onset  . Cancer Father     prostate  . Cancer Sister     breast  . Heart disease Sister   . Cancer Brother     pancreatic  . Cancer Maternal Grandmother     colon  . Dementia Mother   . Sudden death Brother   . Heart attack Brother   . Heart attack Brother   . Arthritis Sister     SOCIAL HISTORY: History  Substance Use Topics  . Smoking status: Former Smoker    Quit date: 11/15/1991  . Smokeless tobacco: Never Used  .  Alcohol Use: 1.2 oz/week    2 Glasses of wine per week     Comment: 1 glass per week    ALLERGIES: Allergies  Allergen Reactions  . Contrast Media [Iodinated Diagnostic Agents] Anaphylaxis  . Adhesive [Tape] Other (See Comments)    blisters  . Cephalexin Swelling  . Ciprofloxacin Swelling  . Demerol Nausea And Vomiting  . Penicillins Swelling    REACTION: allergic to penicillin  . Sulfamethoxazole-Trimethoprim Itching and Swelling    REACTION: swelling/hives  . Quinolones Itching and Rash    REACTION: itching, rash Pt. Reports no problems with levaquin     MEDICATIONS:  Current Outpatient Prescriptions   Medication Sig Dispense Refill  . albuterol (PROAIR HFA) 108 (90 BASE) MCG/ACT inhaler Inhale 2 puffs into the lungs every 6 (six) hours as needed for wheezing or shortness of breath.    . ALPRAZolam (XANAX) 0.25 MG tablet Take 0.25 mg by mouth at bedtime.     Marland Kitchen arformoterol (BROVANA) 15 MCG/2ML NEBU Take 2 mLs (15 mcg total) by nebulization every 12 (twelve) hours. 120 mL 2  . aspirin 81 MG chewable tablet Chew 81 mg by mouth daily.      . budesonide (PULMICORT) 0.5 MG/2ML nebulizer solution Take 2 mLs (0.5 mg total) by nebulization every 12 (twelve) hours. 12 mL 2  . Calcium Carbonate-Vitamin D (CALTRATE 600+D PO) Take 1 tablet by mouth as needed.     . cetirizine (ZYRTEC) 10 MG tablet Take 10 mg by mouth daily.     Marland Kitchen doxepin (SINEQUAN) 10 MG capsule Take 10 mg by mouth at bedtime.     . fluticasone (FLONASE) 50 MCG/ACT nasal spray Place 1 spray into both nostrils daily as needed.     Marland Kitchen guaiFENesin (MUCINEX) 600 MG 12 hr tablet Take 1 to 2 every 12 hours as needed for cough/congestion/thick mucus    . losartan-hydrochlorothiazide (HYZAAR) 50-12.5 MG per tablet Take 1 tablet by mouth daily. Takes Losartan - HCTZ  25-12.38m tablet daily.  1  . MAGNESIUM PO Take 250 mg by mouth daily.     . tamoxifen (NOLVADEX) 20 MG tablet Take 1 tablet (20 mg total) by mouth daily. 30 tablet 0   No current facility-administered medications for this visit.      REVIEW OF SYSTEMS: A 10 point review of systems was completed and is negative except as noted above.   Health Maintenance  Mammogram: 09/2013   PHYSICAL EXAMINATION: BP 133/67 mmHg  Pulse 75  Temp(Src) 97.5 F (36.4 C) (Oral)  Resp 18  Ht 5' (1.524 m)  Wt 133 lb 1.6 oz (60.374 kg)  BMI 25.99 kg/m2  GENERAL: Patient is a well appearing female in no acute distress HEENT:  Sclerae anicteric.  Oropharynx clear and moist. No ulcerations or evidence of oropharyngeal candidiasis. Neck is supple.  NODES:   There is a 1.5-2 cm firm nodule in the  left axilla, movable, nontender. No other cervical , supraclavicular, or axillary lymphadenopathy palpated.  BREAST EXAM:  Thickening at medial area of left mastectomy scar, right breast no masses or nodules.   LUNGS:  Clear to auscultation bilaterally.  No wheezes or rhonchi. HEART:  Regular rate and rhythm. No murmur appreciated. ABDOMEN:  Soft, nontender.  Positive, normoactive bowel sounds. No organomegaly palpated. MSK:  No focal spinal tenderness to palpation. Full range of motion bilaterally in the upper extremities. EXTREMITIES:  No peripheral edema.   SKIN:  Clear with no obvious rashes or skin changes. No nail dyscrasia.  NEURO:  Nonfocal. Well oriented.  Appropriate affect.  ECOG FS:  Grade 1 - Symptomatic but completely ambulatory   LAB RESULTS: Lab Results  Component Value Date   WBC 4.4 11/12/2014   NEUTROABS 2.8 11/12/2014   HGB 12.1 11/12/2014   HCT 37.0 11/12/2014   MCV 94.4 11/12/2014   PLT 123* 11/12/2014      Chemistry      Component Value Date/Time   NA 142 11/12/2014 1232   NA 141 03/15/2014 1548   K 4.0 11/12/2014 1232   K 3.7 03/15/2014 1548   CL 104 03/15/2014 1548   CL 105 04/03/2013 1410   CO2 26 11/12/2014 1232   CO2 27 03/15/2014 1548   BUN 14.7 11/12/2014 1232   BUN 19 03/15/2014 1548   CREATININE 0.9 11/12/2014 1232   CREATININE 0.86 03/15/2014 1548   CREATININE 0.98 05/06/2013 1515      Component Value Date/Time   CALCIUM 8.4 11/12/2014 1232   CALCIUM 9.1 03/15/2014 1548   ALKPHOS 45 11/12/2014 1232   ALKPHOS 46 05/14/2012 1139   AST 19 11/12/2014 1232   AST 19 05/14/2012 1139   ALT 9 11/12/2014 1232   ALT 10 05/14/2012 1139   BILITOT 0.47 11/12/2014 1232   BILITOT 0.4 05/14/2012 1139       Lab Results  Component Value Date   LABCA2 29 08/13/2012    RADIOGRAPHIC STUDIES: CT chest, abdomen and pelvis without contrast on 11/19/2014 IMPRESSION: 1. No acute findings within the chest abdomen or pelvis. No specific features  identified to suggest recurrent tumor or metastatic disease. 2. Fluid attenuating structure within the left axilla which presumably represents a cyst and may be sequelae of prior lymph node dissection. By report the patient had cyst aspiration from the left axilla on 09/21/2014.   ASSESSMENT/PLAN: 81 y.o. with:  1. Stage IIIB, T2 N1 invasive lobular carcinoma, grade 2, ER 95%, PR 97%, Ki-67 17%, HER-2/neu no amplification.  -Status post left breast mastectomy with sentinel biopsy on 09/03/2010. Status post radiation therapy that was completed on 01/23/2011. The patient states she declined chemotherapy.  -She was started on tamoxifen 20 mg daily. She is tolerating the tamoxifen well and will continue this for a total of 5 years -I reviewed her CT scan findings with her, no evidence of recurrence or metastasis. Her left axilla cyst is likely benign.  2. Left chest and epigastric pain -Improved with Nexium and Colace. We'll continue.   I spent 15 minutes counseling the patient face to face.  The total time spent in the appointment was 20 minutes.  Plan -RTC in 3 month -bone density scan before next visit  -continue tamoxifen   Truitt Merle  11/26/2014

## 2014-11-28 ENCOUNTER — Encounter: Payer: Self-pay | Admitting: Hematology

## 2014-12-25 DIAGNOSIS — I129 Hypertensive chronic kidney disease with stage 1 through stage 4 chronic kidney disease, or unspecified chronic kidney disease: Secondary | ICD-10-CM | POA: Diagnosis not present

## 2014-12-25 DIAGNOSIS — Z0000 Encounter for general adult medical examination without abnormal findings: Secondary | ICD-10-CM | POA: Diagnosis not present

## 2014-12-25 DIAGNOSIS — Z79899 Other long term (current) drug therapy: Secondary | ICD-10-CM | POA: Diagnosis not present

## 2014-12-25 DIAGNOSIS — J42 Unspecified chronic bronchitis: Secondary | ICD-10-CM | POA: Diagnosis not present

## 2014-12-25 DIAGNOSIS — Z1389 Encounter for screening for other disorder: Secondary | ICD-10-CM | POA: Diagnosis not present

## 2014-12-25 DIAGNOSIS — J449 Chronic obstructive pulmonary disease, unspecified: Secondary | ICD-10-CM | POA: Diagnosis not present

## 2014-12-25 DIAGNOSIS — N183 Chronic kidney disease, stage 3 (moderate): Secondary | ICD-10-CM | POA: Diagnosis not present

## 2015-01-08 ENCOUNTER — Other Ambulatory Visit: Payer: Self-pay | Admitting: Hematology

## 2015-02-26 ENCOUNTER — Ambulatory Visit (HOSPITAL_BASED_OUTPATIENT_CLINIC_OR_DEPARTMENT_OTHER): Payer: Medicare Other | Admitting: Hematology

## 2015-02-26 ENCOUNTER — Other Ambulatory Visit (HOSPITAL_BASED_OUTPATIENT_CLINIC_OR_DEPARTMENT_OTHER): Payer: Medicare Other

## 2015-02-26 ENCOUNTER — Telehealth: Payer: Self-pay | Admitting: Hematology

## 2015-02-26 VITALS — BP 128/74 | HR 85 | Temp 98.3°F | Resp 18 | Ht 60.0 in | Wt 127.1 lb

## 2015-02-26 DIAGNOSIS — C50912 Malignant neoplasm of unspecified site of left female breast: Secondary | ICD-10-CM

## 2015-02-26 DIAGNOSIS — R1013 Epigastric pain: Secondary | ICD-10-CM

## 2015-02-26 DIAGNOSIS — Z803 Family history of malignant neoplasm of breast: Secondary | ICD-10-CM | POA: Diagnosis not present

## 2015-02-26 DIAGNOSIS — R079 Chest pain, unspecified: Secondary | ICD-10-CM | POA: Diagnosis not present

## 2015-02-26 DIAGNOSIS — Z808 Family history of malignant neoplasm of other organs or systems: Secondary | ICD-10-CM | POA: Diagnosis not present

## 2015-02-26 DIAGNOSIS — C50012 Malignant neoplasm of nipple and areola, left female breast: Secondary | ICD-10-CM

## 2015-02-26 LAB — CBC WITH DIFFERENTIAL/PLATELET
BASO%: 0.7 % (ref 0.0–2.0)
Basophils Absolute: 0 10*3/uL (ref 0.0–0.1)
EOS%: 5.7 % (ref 0.0–7.0)
Eosinophils Absolute: 0.2 10*3/uL (ref 0.0–0.5)
HCT: 37.2 % (ref 34.8–46.6)
HEMOGLOBIN: 12.3 g/dL (ref 11.6–15.9)
LYMPH%: 20.4 % (ref 14.0–49.7)
MCH: 30.9 pg (ref 25.1–34.0)
MCHC: 33.1 g/dL (ref 31.5–36.0)
MCV: 93.5 fL (ref 79.5–101.0)
MONO#: 0.4 10*3/uL (ref 0.1–0.9)
MONO%: 8.6 % (ref 0.0–14.0)
NEUT#: 2.6 10*3/uL (ref 1.5–6.5)
NEUT%: 64.6 % (ref 38.4–76.8)
PLATELETS: 117 10*3/uL — AB (ref 145–400)
RBC: 3.98 10*6/uL (ref 3.70–5.45)
RDW: 13.1 % (ref 11.2–14.5)
WBC: 4.1 10*3/uL (ref 3.9–10.3)
lymph#: 0.8 10*3/uL — ABNORMAL LOW (ref 0.9–3.3)

## 2015-02-26 LAB — COMPREHENSIVE METABOLIC PANEL (CC13)
ALK PHOS: 54 U/L (ref 40–150)
ALT: 11 U/L (ref 0–55)
AST: 18 U/L (ref 5–34)
Albumin: 3.4 g/dL — ABNORMAL LOW (ref 3.5–5.0)
Anion Gap: 9 mEq/L (ref 3–11)
BUN: 16.9 mg/dL (ref 7.0–26.0)
CO2: 24 mEq/L (ref 22–29)
Calcium: 9 mg/dL (ref 8.4–10.4)
Chloride: 110 mEq/L — ABNORMAL HIGH (ref 98–109)
Creatinine: 0.9 mg/dL (ref 0.6–1.1)
EGFR: 60 mL/min/{1.73_m2} — AB (ref >90)
Glucose: 118 mg/dl (ref 70–140)
POTASSIUM: 4 meq/L (ref 3.5–5.1)
Sodium: 142 mEq/L (ref 136–145)
Total Bilirubin: 0.42 mg/dL (ref 0.20–1.20)
Total Protein: 5.8 g/dL — ABNORMAL LOW (ref 6.4–8.3)

## 2015-02-26 NOTE — Telephone Encounter (Signed)
Pt confirmed labs/ov per 04/29 POF, gave pt AVS and Calendar.... KJ,

## 2015-02-26 NOTE — Progress Notes (Signed)
[facility]  Telephone:(336) [phone] Fax:(336) [phone]  OFFICE PROGRESS NOTE  PATIENT: Gloria [name]   DOB: [birth year]  MR#: [id number]  EXB#:[id number]  NU:[id number],IHK Gloria Moores, MD Gloria Edison, MD Gloria [name]. Gloria Nakayama, MD  DIAGNOSIS:  An 81 year old [place] woman with invasive lobular carcinoma of the left breast diagnosed in 07/2010.  PRIOR THERAPY: 1. The patient noted retraction of her nipple for 3 months prior on the left breast and underwent a mammogram on 08/03/2010 with an ultrasound. Exam showed left nipple retraction, and the palpable mass subareolar left breast at the 6:00 position. Ultrasound confirmed the presence of a mass measuring 2.6 x 1.7 x 2.4 cm.  2. A biopsy was performed on 08/04/2010 which path followed she showed to be an invasive mammary carcinoma with lobular features. ER 95%, PR 97%, Ki-67 17%, HER-2/neu was non amplified with a ratio of 1.  3. An MRI on 08/12/19,011 showed the mass to be larger at 4.6 x 4.2 x 2.3 cm with enhancement distortion extending to the nipple retraction. There is questionable anterior mediastinal lymph node seen.  4. Neoadjuvant antiestrogen therapy with Femara started in 07/2010.  5. Status post left breast mastectomy with sentinel node biopsy on 09/03/2010.  6. The patient was started on antiestrogen therapy with tamoxifen 08/2010.  7. Status post radiation therapy from 12/03/2010 through 01/23/2011.   CURRENT THERAPY:  Tamoxifen 20 mg by mouth daily since 08/2010.   INTERVAL HISTORY:  Gloria [name] is returns for follow up with her granddaughter. She is doing well overall, but she is somehow constantly concerned about cancer recurrence. She reports some chest wall edema at the left mastectomy surgical site, no pain or tenderness. She has good energy and appetite, no other new symptoms.   PAST MEDICAL HISTORY: Past Medical History  Diagnosis Date  . Cancer     breast- left  . GERD (gastroesophageal reflux disease)    . Hypertension   . Anxiety   . Depression   . Blood in urine   . Knee fracture   . CAD (coronary artery disease)     non obstructive by cath  . TR (tricuspid regurgitation)     mild by Echo 12/2008 EF >55%    PAST SURGICAL HISTORY: Past Surgical History  Procedure Laterality Date  . Tonsillectomy    . Breast surgery  08-30-10    mastectomy  . Colon surgery    . Abdominal hysterectomy    . Cardiac catheterization  08/2011    20% LAD stenosis, 20% diagonal stenosis, 10-20% proximal dominant RCA stenosis  . Left heart catheterization with coronary angiogram N/A 09/08/2011    Procedure: LEFT HEART CATHETERIZATION WITH CORONARY ANGIOGRAM;  Surgeon: Troy Sine, MD;  Location: [facility] CATH LAB;  Service: Cardiovascular;  Laterality: N/A;  . Abdominal aortagram N/A 09/08/2011    Procedure: ABDOMINAL Maxcine Ham;  Surgeon: Troy Sine, MD;  Location: [facility] CATH LAB;  Service: Cardiovascular;  Laterality: N/A;    FAMILY HISTORY: Family History  Problem Relation Age of Onset  . Cancer Father     prostate  . Cancer Sister     breast  . Heart disease Sister   . Cancer Brother     pancreatic  . Cancer Maternal Grandmother     colon  . Dementia Mother   . Sudden death Brother   . Heart attack Brother   . Heart attack Brother   . Arthritis Sister     SOCIAL HISTORY: History  Substance Use Topics  .  Smoking status: Former Smoker    Quit date: 11/15/1991  . Smokeless tobacco: Never Used  . Alcohol Use: 1.2 oz/week    2 Glasses of wine per week     Comment: 1 glass per week    ALLERGIES: Allergies  Allergen Reactions  . Contrast Media [Iodinated Diagnostic Agents] Anaphylaxis  . Adhesive [Tape] Other (See Comments)    blisters  . Cephalexin Swelling  . Ciprofloxacin Swelling  . Demerol Nausea And Vomiting  . Penicillins Swelling    REACTION: allergic to penicillin  . Sulfamethoxazole-Trimethoprim Itching and Swelling    REACTION: swelling/hives  . Quinolones Itching and  Rash    REACTION: itching, rash Pt. Reports no problems with levaquin     MEDICATIONS:  Current Outpatient Prescriptions  Medication Sig Dispense Refill  . albuterol (PROAIR HFA) 108 (90 BASE) MCG/ACT inhaler Inhale 2 puffs into the lungs every 6 (six) hours as needed for wheezing or shortness of breath.    . ALPRAZolam (XANAX) 0.25 MG tablet Take 0.25 mg by mouth at bedtime.     Marland Kitchen arformoterol (BROVANA) 15 MCG/2ML NEBU Take 2 mLs (15 mcg total) by nebulization every 12 (twelve) hours. (Patient not taking: Reported on 02/26/2015) 120 mL 2  . aspirin 81 MG chewable tablet Chew 81 mg by mouth daily.      . budesonide (PULMICORT) 0.5 MG/2ML nebulizer solution Take 2 mLs (0.5 mg total) by nebulization every 12 (twelve) hours. (Patient not taking: Reported on 02/26/2015) 12 mL 2  . Calcium Carbonate-Vitamin D (CALTRATE 600+D PO) Take 1 tablet by mouth daily.     . cetirizine (ZYRTEC) 10 MG tablet Take 10 mg by mouth as needed.     . doxepin (SINEQUAN) 10 MG capsule Take 10 mg by mouth at bedtime.     . fluticasone (FLONASE) 50 MCG/ACT nasal spray Place 1 spray into both nostrils daily as needed.     Marland Kitchen guaiFENesin (MUCINEX) 600 MG 12 hr tablet Take 1 to 2 every 12 hours as needed for cough/congestion/thick mucus    . losartan (COZAAR) 25 MG tablet Take 50 mg by mouth daily.  0  . MAGNESIUM PO Take 250 mg by mouth daily.     . tamoxifen (NOLVADEX) 20 MG tablet take 1 tablet by mouth once daily 30 tablet 1   No current facility-administered medications for this visit.      REVIEW OF SYSTEMS: A 10 point review of systems was completed and is negative except as noted above.   Health Maintenance  Mammogram: 09/2013   PHYSICAL EXAMINATION: BP 128/74 mmHg  Pulse 85  Temp(Src) 98.3 F (36.8 C) (Oral)  Resp 18  Ht 5' (1.524 m)  Wt 127 lb 1.6 oz (57.652 kg)  BMI 24.82 kg/m2  SpO2 97%  GENERAL: Patient is a well appearing female in no acute distress HEENT:  Sclerae anicteric.  Oropharynx  clear and moist. No ulcerations or evidence of oropharyngeal candidiasis. Neck is supple.  NODES:   There is no cervical , supraclavicular, or axillary lymphadenopathy palpated.  BREAST EXAM:  Skin fold and thickening at medial area of left mastectomy scar, no palpable mass on left chest wall, right breast no masses or nodules.   LUNGS:  Clear to auscultation bilaterally.  No wheezes or rhonchi. HEART:  Regular rate and rhythm. No murmur appreciated. ABDOMEN:  Soft, nontender.  Positive, normoactive bowel sounds. No organomegaly palpated. MSK:  No focal spinal tenderness to palpation. Full range of motion bilaterally in the upper extremities.  EXTREMITIES:  No peripheral edema.   SKIN:  Clear with no obvious rashes or skin changes. No nail dyscrasia. NEURO:  Nonfocal. Well oriented.  Appropriate affect.  ECOG FS:  Grade 1 - Symptomatic but completely ambulatory   LAB RESULTS: Lab Results  Component Value Date   WBC 4.1 02/26/2015   NEUTROABS 2.6 02/26/2015   HGB 12.3 02/26/2015   HCT 37.2 02/26/2015   MCV 93.5 02/26/2015   PLT 117* 02/26/2015   CMP Latest Ref Rng 02/26/2015 11/12/2014 09/11/2014  Glucose 70 - 140 mg/dl 118 88 84  BUN 7.0 - 26.0 mg/dL 16.9 14.7 20.7  Creatinine 0.6 - 1.1 mg/dL 0.9 0.9 0.9  Sodium 136 - 145 mEq/L 142 142 142  Potassium 3.5 - 5.1 mEq/L 4.0 4.0 4.0  Chloride 96 - 112 mEq/L - - -  CO2 22 - 29 mEq/L 24 26 28   Calcium 8.4 - 10.4 mg/dL 9.0 8.4 9.4  Total Protein 6.4 - 8.3 g/dL 5.8(L) 5.8(L) 6.4  Total Bilirubin 0.20 - 1.20 mg/dL 0.42 0.47 0.53  Alkaline Phos 40 - 150 U/L 54 45 48  AST 5 - 34 U/L 18 19 21   ALT 0 - 55 U/L 11 9 11      RADIOGRAPHIC STUDIES: CT chest, abdomen and pelvis without contrast on 11/19/2014 IMPRESSION: 1. No acute findings within the chest abdomen or pelvis. No specific features identified to suggest recurrent tumor or metastatic disease. 2. Fluid attenuating structure within the left axilla which presumably represents a  cyst and may be sequelae of prior lymph node dissection. By report the patient had cyst aspiration from the left axilla on 09/21/2014.   ASSESSMENT/PLAN: 81 y.o. with:  1. Stage IIIB, T2 N1 invasive lobular carcinoma, grade 2, ER 95%, PR 97%, Ki-67 17%, HER-2/neu no amplification.  -Status post left breast mastectomy with sentinel biopsy on 09/03/2010. Status post radiation therapy that was completed on 01/23/2011. The patient states she declined chemotherapy.  -She was started on tamoxifen 20 mg daily. She is tolerating the tamoxifen well. Given her advanced age, I recommend a total of 5 years treatment, but she is very concerned about cancer recurrence, and wish to continue tamoxifen beyond 5 years.  -I reviewed her CT scan findings with her, no evidence of recurrence or metastasis. Her left axilla cyst is likely benign. -Continue annual screening mammogram. She is due in December 2016.   2. Left chest and epigastric pain -Improved with Nexium and Colace. We'll continue.   I spent 15 minutes counseling the patient face to face.  The total time spent in the appointment was 20 minutes.  Plan -RTC in 6 month with lab  -continue tamoxifen   Truitt Merle 02/26/2015

## 2015-02-27 ENCOUNTER — Encounter: Payer: Self-pay | Admitting: Hematology

## 2015-03-02 ENCOUNTER — Other Ambulatory Visit: Payer: Self-pay | Admitting: Hematology

## 2015-03-02 ENCOUNTER — Other Ambulatory Visit: Payer: Self-pay

## 2015-03-02 NOTE — Telephone Encounter (Signed)
Called pt to ask which pharmacy to refill tamoxifen at. She said she just had a refill and does not want one at the present time. Clarified her preferred pharmacy for her chart.

## 2015-03-08 DIAGNOSIS — H35073 Retinal telangiectasis, bilateral: Secondary | ICD-10-CM | POA: Diagnosis not present

## 2015-03-08 DIAGNOSIS — H43811 Vitreous degeneration, right eye: Secondary | ICD-10-CM | POA: Diagnosis not present

## 2015-04-05 DIAGNOSIS — C50912 Malignant neoplasm of unspecified site of left female breast: Secondary | ICD-10-CM | POA: Diagnosis not present

## 2015-04-19 DIAGNOSIS — S61412A Laceration without foreign body of left hand, initial encounter: Secondary | ICD-10-CM | POA: Diagnosis not present

## 2015-06-14 ENCOUNTER — Other Ambulatory Visit: Payer: Self-pay | Admitting: *Deleted

## 2015-06-14 MED ORDER — NITROGLYCERIN 0.4 MG SL SUBL: 0.4000 mg | SUBLINGUAL_TABLET | SUBLINGUAL | Status: DC | PRN

## 2015-06-23 ENCOUNTER — Other Ambulatory Visit: Payer: Self-pay | Admitting: *Deleted

## 2015-06-23 MED ORDER — NITROGLYCERIN 0.4 MG SL SUBL: 0.4000 mg | SUBLINGUAL_TABLET | SUBLINGUAL | Status: DC | PRN

## 2015-06-23 NOTE — Telephone Encounter (Signed)
Rx(s) sent to pharmacy electronically.  

## 2015-06-25 DIAGNOSIS — J449 Chronic obstructive pulmonary disease, unspecified: Secondary | ICD-10-CM | POA: Diagnosis not present

## 2015-06-25 DIAGNOSIS — L729 Follicular cyst of the skin and subcutaneous tissue, unspecified: Secondary | ICD-10-CM | POA: Diagnosis not present

## 2015-06-25 DIAGNOSIS — I10 Essential (primary) hypertension: Secondary | ICD-10-CM | POA: Diagnosis not present

## 2015-08-30 DIAGNOSIS — Z23 Encounter for immunization: Secondary | ICD-10-CM | POA: Diagnosis not present

## 2015-09-02 ENCOUNTER — Encounter: Payer: Self-pay | Admitting: Hematology

## 2015-09-02 ENCOUNTER — Other Ambulatory Visit (HOSPITAL_BASED_OUTPATIENT_CLINIC_OR_DEPARTMENT_OTHER): Payer: Medicare Other

## 2015-09-02 ENCOUNTER — Telehealth: Payer: Self-pay | Admitting: Hematology

## 2015-09-02 ENCOUNTER — Ambulatory Visit (HOSPITAL_COMMUNITY)
Admission: RE | Admit: 2015-09-02 | Discharge: 2015-09-02 | Disposition: A | Discharge status: Home / Self Care | Payer: Medicare Other | Source: Ambulatory Visit | Attending: Hematology | Admitting: Hematology

## 2015-09-02 ENCOUNTER — Ambulatory Visit (HOSPITAL_BASED_OUTPATIENT_CLINIC_OR_DEPARTMENT_OTHER): Payer: Medicare Other | Admitting: Hematology

## 2015-09-02 VITALS — BP 123/78 | HR 75 | Temp 98.6°F | Resp 20 | Ht 60.0 in | Wt 126.7 lb

## 2015-09-02 DIAGNOSIS — C50212 Malignant neoplasm of upper-inner quadrant of left female breast: Secondary | ICD-10-CM

## 2015-09-02 DIAGNOSIS — Z853 Personal history of malignant neoplasm of breast: Secondary | ICD-10-CM

## 2015-09-02 DIAGNOSIS — R0789 Other chest pain: Secondary | ICD-10-CM | POA: Diagnosis not present

## 2015-09-02 DIAGNOSIS — C50912 Malignant neoplasm of unspecified site of left female breast: Secondary | ICD-10-CM

## 2015-09-02 DIAGNOSIS — Z9012 Acquired absence of left breast and nipple: Secondary | ICD-10-CM | POA: Insufficient documentation

## 2015-09-02 DIAGNOSIS — R918 Other nonspecific abnormal finding of lung field: Secondary | ICD-10-CM | POA: Diagnosis not present

## 2015-09-02 LAB — CBC WITH DIFFERENTIAL/PLATELET
BASO%: 1 % (ref 0.0–2.0)
BASOS ABS: 0 10*3/uL (ref 0.0–0.1)
EOS%: 7.3 % — AB (ref 0.0–7.0)
Eosinophils Absolute: 0.3 10*3/uL (ref 0.0–0.5)
HCT: 40.9 % (ref 34.8–46.6)
HGB: 13.5 g/dL (ref 11.6–15.9)
LYMPH#: 0.7 10*3/uL — AB (ref 0.9–3.3)
LYMPH%: 17.9 % (ref 14.0–49.7)
MCH: 30.6 pg (ref 25.1–34.0)
MCHC: 33 g/dL (ref 31.5–36.0)
MCV: 92.9 fL (ref 79.5–101.0)
MONO#: 0.4 10*3/uL (ref 0.1–0.9)
MONO%: 10.9 % (ref 0.0–14.0)
NEUT#: 2.5 10*3/uL (ref 1.5–6.5)
NEUT%: 62.9 % (ref 38.4–76.8)
Platelets: 137 10*3/uL — ABNORMAL LOW (ref 145–400)
RBC: 4.4 10*6/uL (ref 3.70–5.45)
RDW: 13.8 % (ref 11.2–14.5)
WBC: 4 10*3/uL (ref 3.9–10.3)

## 2015-09-02 LAB — COMPREHENSIVE METABOLIC PANEL (CC13)
ALT: 19 U/L (ref 0–55)
AST: 23 U/L (ref 5–34)
Albumin: 3.6 g/dL (ref 3.5–5.0)
Alkaline Phosphatase: 85 U/L (ref 40–150)
Anion Gap: 6 mEq/L (ref 3–11)
BUN: 17 mg/dL (ref 7.0–26.0)
CHLORIDE: 109 meq/L (ref 98–109)
CO2: 28 mEq/L (ref 22–29)
Calcium: 9.7 mg/dL (ref 8.4–10.4)
Creatinine: 1.1 mg/dL (ref 0.6–1.1)
EGFR: 48 mL/min/{1.73_m2} — ABNORMAL LOW (ref >90)
GLUCOSE: 89 mg/dL (ref 70–140)
POTASSIUM: 4.5 meq/L (ref 3.5–5.1)
Sodium: 143 mEq/L (ref 136–145)
Total Bilirubin: 0.56 mg/dL (ref 0.20–1.20)
Total Protein: 6.3 g/dL — ABNORMAL LOW (ref 6.4–8.3)

## 2015-09-02 NOTE — Telephone Encounter (Signed)
per pof to sch pt appt-gave pt copy of avs °

## 2015-09-02 NOTE — Progress Notes (Addendum)
[place]  Telephone:(336) [phone] Fax:(336) [phone]  OFFICE PROGRESS NOTE  PATIENT: Gloria [name]   DOB: [birth year]  MR#: [id number]  JYN#:[id number]  QM:[id number],BMW [name], MD [name], MD [name]. [name], MD  DIAGNOSIS:  An 81 year old [place] woman with invasive lobular carcinoma of the left breast diagnosed in 07/2010.  PRIOR THERAPY: 1. The patient noted retraction of her nipple for 3 months prior on the left breast and underwent a mammogram on 08/03/2010 with an ultrasound. Exam showed left nipple retraction, and the palpable mass subareolar left breast at the 6:00 position. Ultrasound confirmed the presence of a mass measuring 2.6 x 1.7 x 2.4 cm.  2. A biopsy was performed on 08/04/2010 which path followed she showed to be an invasive mammary carcinoma with lobular features. ER 95%, PR 97%, Ki-67 17%, HER-2/neu was non amplified with a ratio of 1.  3. An MRI on 08/12/19,011 showed the mass to be larger at 4.6 x 4.2 x 2.3 cm with enhancement distortion extending to the nipple retraction. There is questionable anterior mediastinal lymph node seen.  4. Neoadjuvant antiestrogen therapy with Femara started in 07/2010.  5. Status post left breast mastectomy with sentinel node biopsy on 09/03/2010.  6. The patient was started on antiestrogen therapy with tamoxifen 08/2010, and stopped in 01/2015.   7. Status post radiation therapy from 12/03/2010 through 01/23/2011.   CURRENT THERAPY:  Observation    INTERVAL HISTORY:  Gloria [name] is returns for follow up with her granddaughter. She complains about left chest wall tenderness for the past few month, mainly at the edge of the bra,  no injury. She states it's constant, mild to moderate, no other pain. She previously complained middle chest pain, which has resolved. She denies any other body pain, no dyspnea, no cough, no fever or other new symptoms. She stopped the tamoxifen since her last visit with me, she thought  it was my recommendation.    PAST MEDICAL HISTORY: Past Medical History  Diagnosis Date  . Cancer [facility])     breast- left  . GERD (gastroesophageal reflux disease)   . Hypertension   . Anxiety   . Depression   . Blood in urine   . Knee fracture   . CAD (coronary artery disease)     non obstructive by cath  . TR (tricuspid regurgitation)     mild by Echo 12/2008 EF >55%    PAST SURGICAL HISTORY: Past Surgical History  Procedure Laterality Date  . Tonsillectomy    . Breast surgery  08-30-10    mastectomy  . Colon surgery    . Abdominal hysterectomy    . Cardiac catheterization  08/2011    20% LAD stenosis, 20% diagonal stenosis, 10-20% proximal dominant RCA stenosis  . Left heart catheterization with coronary angiogram N/A 09/08/2011    Procedure: LEFT HEART CATHETERIZATION WITH CORONARY ANGIOGRAM;  Surgeon: Troy Sine, MD;  Location: [facility] CATH LAB;  Service: Cardiovascular;  Laterality: N/A;  . Abdominal aortagram N/A 09/08/2011    Procedure: ABDOMINAL Maxcine Ham;  Surgeon: Troy Sine, MD;  Location: [facility] CATH LAB;  Service: Cardiovascular;  Laterality: N/A;    FAMILY HISTORY: Family History  Problem Relation Age of Onset  . Cancer Father     prostate  . Cancer Sister     breast  . Heart disease Sister   . Cancer Brother     pancreatic  . Cancer Maternal Grandmother     colon  . Dementia Mother   .  Sudden death Brother   . Heart attack Brother   . Heart attack Brother   . Arthritis Sister     SOCIAL HISTORY: Social History  Substance Use Topics  . Smoking status: Former Smoker    Quit date: 11/15/1991  . Smokeless tobacco: Never Used  . Alcohol Use: 1.2 oz/week    2 Glasses of wine per week     Comment: 1 glass per week    ALLERGIES: Allergies  Allergen Reactions  . Contrast Media [Iodinated Diagnostic Agents] Anaphylaxis  . Adhesive [Tape] Other (See Comments)    blisters  . Cephalexin Swelling  . Ciprofloxacin Swelling  . Demerol Nausea And  Vomiting  . Penicillins Swelling    REACTION: allergic to penicillin  . Sulfamethoxazole-Trimethoprim Itching and Swelling    REACTION: swelling/hives  . Quinolones Itching and Rash    REACTION: itching, rash Pt. Reports no problems with levaquin     MEDICATIONS:  Current Outpatient Prescriptions  Medication Sig Dispense Refill  . albuterol (PROAIR HFA) 108 (90 BASE) MCG/ACT inhaler Inhale 2 puffs into the lungs every 6 (six) hours as needed for wheezing or shortness of breath.    . ALPRAZolam (XANAX) 0.25 MG tablet Take 0.25 mg by mouth at bedtime.     Marland Kitchen arformoterol (BROVANA) 15 MCG/2ML NEBU Take 2 mLs (15 mcg total) by nebulization every 12 (twelve) hours. (Patient not taking: Reported on 02/26/2015) 120 mL 2  . aspirin 81 MG chewable tablet Chew 81 mg by mouth daily.      . budesonide (PULMICORT) 0.5 MG/2ML nebulizer solution Take 2 mLs (0.5 mg total) by nebulization every 12 (twelve) hours. (Patient not taking: Reported on 02/26/2015) 12 mL 2  . Calcium Carbonate-Vitamin D (CALTRATE 600+D PO) Take 1 tablet by mouth daily.     . cetirizine (ZYRTEC) 10 MG tablet Take 10 mg by mouth as needed.     . doxepin (SINEQUAN) 10 MG capsule Take 10 mg by mouth at bedtime.     . fluticasone (FLONASE) 50 MCG/ACT nasal spray Place 1 spray into both nostrils daily as needed.     Marland Kitchen guaiFENesin (MUCINEX) 600 MG 12 hr tablet Take 1 to 2 every 12 hours as needed for cough/congestion/thick mucus    . losartan (COZAAR) 25 MG tablet Take 50 mg by mouth daily.  0  . MAGNESIUM PO Take 250 mg by mouth daily.     . nitroGLYCERIN (NITROSTAT) 0.4 MG SL tablet Place 1 tablet (0.4 mg total) under the tongue every 5 (five) minutes as needed for chest pain. <PLEASE MAKE APPOINTMENT> 25 tablet 0  . tamoxifen (NOLVADEX) 20 MG tablet take 1 tablet by mouth once daily 30 tablet 1   No current facility-administered medications for this visit.      REVIEW OF SYSTEMS: A 10 point review of systems was completed and is  negative except as noted above.   Health Maintenance  Mammogram: 09/2013   PHYSICAL EXAMINATION: BP 123/78 mmHg  Pulse 75  Temp(Src) 98.6 F (37 C) (Oral)  Resp 20  Ht 5' (1.524 m)  Wt 126 lb 11.2 oz (57.471 kg)  BMI 24.74 kg/m2  SpO2 98%  GENERAL: Patient is a well appearing female in no acute distress HEENT:  Sclerae anicteric.  Oropharynx clear and moist. No ulcerations or evidence of oropharyngeal candidiasis. Neck is supple.  NODES:   There is no cervical , supraclavicular, or axillary lymphadenopathy palpated.  BREAST EXAM:  Skin fold and thickening at medial area of left  mastectomy scar, no palpable mass on left chest wall, (+) mild diffuse tenderness at left front chest wall, right breast no masses or nodules.   LUNGS:  Clear to auscultation bilaterally.  No wheezes or rhonchi. HEART:  Regular rate and rhythm. No murmur appreciated. ABDOMEN:  Soft, nontender.  Positive, normoactive bowel sounds. No organomegaly palpated. MSK:  No focal spinal tenderness to palpation. Full range of motion bilaterally in the upper extremities. EXTREMITIES:  No peripheral edema.   SKIN:  Clear with no obvious rashes or skin changes. No nail dyscrasia. NEURO:  Nonfocal. Well oriented.  Appropriate affect.  ECOG FS:  Grade 1 - Symptomatic but completely ambulatory   LAB RESULTS: Lab Results  Component Value Date   WBC 4.0 09/02/2015   NEUTROABS 2.5 09/02/2015   HGB 13.5 09/02/2015   HCT 40.9 09/02/2015   MCV 92.9 09/02/2015   PLT 137* 09/02/2015   CMP Latest Ref Rng 02/26/2015 11/12/2014 09/11/2014  Glucose 70 - 140 mg/dl 118 88 84  BUN 7.0 - 26.0 mg/dL 16.9 14.7 20.7  Creatinine 0.6 - 1.1 mg/dL 0.9 0.9 0.9  Sodium 136 - 145 mEq/L 142 142 142  Potassium 3.5 - 5.1 mEq/L 4.0 4.0 4.0  Chloride 96 - 112 mEq/L - - -  CO2 22 - 29 mEq/L 24 26 28   Calcium 8.4 - 10.4 mg/dL 9.0 8.4 9.4  Total Protein 6.4 - 8.3 g/dL 5.8(L) 5.8(L) 6.4  Total Bilirubin 0.20 - 1.20 mg/dL 0.42 0.47 0.53   Alkaline Phos 40 - 150 U/L 54 45 48  AST 5 - 34 U/L 18 19 21   ALT 0 - 55 U/L 11 9 11      RADIOGRAPHIC STUDIES: CT chest, abdomen and pelvis without contrast on 11/19/2014 IMPRESSION: 1. No acute findings within the chest abdomen or pelvis. No specific features identified to suggest recurrent tumor or metastatic disease. 2. Fluid attenuating structure within the left axilla which presumably represents a cyst and may be sequelae of prior lymph node dissection. By report the patient had cyst aspiration from the left axilla on 09/21/2014.   ASSESSMENT/PLAN: 81 y.o. with:  1. Stage IIIB, T2 N1 invasive lobular carcinoma, grade 2, ER 95%, PR 97%, Ki-67 17%, HER-2/neu no amplification.  -Status post left breast mastectomy with sentinel biopsy on 09/03/2010. Status post radiation therapy that was completed on 01/23/2011. The patient states she declined chemotherapy.  -She was started on tamoxifen 20 mg daily. She is tolerating the tamoxifen well. Given her advanced age, I recommend a total of 5 years treatment, she was concerned about her cardiovascular risk from tamoxifen, and came off in April 2016. He received a total 4 and half years, which I think is reasonable given her advanced age.  -She has been having nonspecific chest pain since earlier this year, CT scan in January was negative. She now has left-sided chest wall tenderness, I'll obtain a chest x-ray to ruled out any rib lesions. -Continue annual screening mammogram. She is due in December 2016.   -She is going to see Dr. Marlou Starks next months.   2. Left chest chest wall tenderness -CXR today -no palpable chest wall mass. Will observe it if CXR negative    I spent 15 minutes counseling the patient face to face.  The total time spent in the appointment was 20 minutes.  Plan -RTC in 6 month with lab  -CXR today   Truitt Merle  09/02/2015

## 2015-09-06 DIAGNOSIS — R350 Frequency of micturition: Secondary | ICD-10-CM | POA: Diagnosis not present

## 2015-09-07 ENCOUNTER — Telehealth: Payer: Self-pay | Admitting: *Deleted

## 2015-09-07 NOTE — Telephone Encounter (Signed)
-----   Message from Truitt Merle, MD sent at 09/06/2015  9:13 AM EST ----- Janifer Adie,   Please call her about her CXR result, no fracture or other chanages.  Truitt Merle

## 2015-09-07 NOTE — Telephone Encounter (Signed)
Called pt per Dr Ernestina Penna instructions & informed CXR showed no fractures or any other changes.

## 2015-10-01 DIAGNOSIS — C50912 Malignant neoplasm of unspecified site of left female breast: Secondary | ICD-10-CM | POA: Diagnosis not present

## 2015-10-07 ENCOUNTER — Other Ambulatory Visit: Payer: Self-pay

## 2015-10-07 ENCOUNTER — Other Ambulatory Visit: Payer: Self-pay | Admitting: General Surgery

## 2015-10-07 DIAGNOSIS — H11823 Conjunctivochalasis, bilateral: Secondary | ICD-10-CM | POA: Diagnosis not present

## 2015-10-07 DIAGNOSIS — N63 Unspecified lump in unspecified breast: Secondary | ICD-10-CM

## 2015-10-07 DIAGNOSIS — H02845 Edema of left lower eyelid: Secondary | ICD-10-CM | POA: Diagnosis not present

## 2015-10-07 DIAGNOSIS — H02842 Edema of right lower eyelid: Secondary | ICD-10-CM | POA: Diagnosis not present

## 2015-10-07 DIAGNOSIS — H26493 Other secondary cataract, bilateral: Secondary | ICD-10-CM | POA: Diagnosis not present

## 2015-10-12 ENCOUNTER — Ambulatory Visit
Admission: RE | Admit: 2015-10-12 | Discharge: 2015-10-12 | Disposition: A | Discharge status: Home / Self Care | Payer: Medicare Other | Source: Ambulatory Visit | Attending: General Surgery | Admitting: General Surgery

## 2015-10-12 ENCOUNTER — Other Ambulatory Visit: Payer: Self-pay | Admitting: General Surgery

## 2015-10-12 DIAGNOSIS — N63 Unspecified lump in unspecified breast: Secondary | ICD-10-CM

## 2015-10-12 DIAGNOSIS — R922 Inconclusive mammogram: Secondary | ICD-10-CM | POA: Diagnosis not present

## 2015-10-19 ENCOUNTER — Ambulatory Visit
Admission: RE | Admit: 2015-10-19 | Discharge: 2015-10-19 | Disposition: A | Discharge status: Home / Self Care | Payer: Medicare Other | Source: Ambulatory Visit | Attending: General Surgery | Admitting: General Surgery

## 2015-10-19 ENCOUNTER — Other Ambulatory Visit: Payer: Self-pay | Admitting: General Surgery

## 2015-10-19 DIAGNOSIS — C50612 Malignant neoplasm of axillary tail of left female breast: Secondary | ICD-10-CM | POA: Diagnosis not present

## 2015-10-19 DIAGNOSIS — N63 Unspecified lump in unspecified breast: Secondary | ICD-10-CM

## 2015-10-19 DIAGNOSIS — M7989 Other specified soft tissue disorders: Secondary | ICD-10-CM | POA: Diagnosis not present

## 2015-10-28 ENCOUNTER — Encounter: Payer: Self-pay | Admitting: *Deleted

## 2015-10-31 DIAGNOSIS — Z923 Personal history of irradiation: Secondary | ICD-10-CM

## 2015-10-31 HISTORY — DX: Personal history of irradiation: Z92.3

## 2015-10-31 HISTORY — PX: BREAST EXCISIONAL BIOPSY: SUR124

## 2015-11-02 ENCOUNTER — Encounter: Payer: Self-pay | Admitting: Hematology

## 2015-11-02 ENCOUNTER — Encounter: Payer: Medicare Other | Admitting: Hematology

## 2015-11-02 NOTE — Progress Notes (Signed)
No show  This encounter was created in error - please disregard.

## 2015-11-03 ENCOUNTER — Telehealth: Payer: Self-pay | Admitting: Hematology

## 2015-11-03 ENCOUNTER — Other Ambulatory Visit: Payer: Self-pay | Admitting: General Surgery

## 2015-11-03 DIAGNOSIS — C50912 Malignant neoplasm of unspecified site of left female breast: Secondary | ICD-10-CM | POA: Diagnosis not present

## 2015-11-03 NOTE — Telephone Encounter (Signed)
cld * left message w/pt to adv to call and r/s missed appt per Dr Burr Medico

## 2015-11-11 ENCOUNTER — Encounter (HOSPITAL_BASED_OUTPATIENT_CLINIC_OR_DEPARTMENT_OTHER): Payer: Self-pay | Admitting: *Deleted

## 2015-11-12 ENCOUNTER — Encounter: Payer: Self-pay | Admitting: *Deleted

## 2015-11-12 ENCOUNTER — Telehealth: Payer: Self-pay | Admitting: Hematology

## 2015-11-12 NOTE — Telephone Encounter (Signed)
Called and left a message with surgical follow up °

## 2015-11-18 ENCOUNTER — Encounter (HOSPITAL_COMMUNITY): Payer: Self-pay | Admitting: *Deleted

## 2015-11-18 NOTE — Progress Notes (Signed)
Spoke with Abigail Butts at Dr.Toth's office to confirm this case is to be done under local.  If not she will get back to [place] and or move the pt to the main OR at [facility].

## 2015-11-25 ENCOUNTER — Encounter (HOSPITAL_BASED_OUTPATIENT_CLINIC_OR_DEPARTMENT_OTHER): Payer: Self-pay

## 2015-11-25 ENCOUNTER — Encounter (HOSPITAL_BASED_OUTPATIENT_CLINIC_OR_DEPARTMENT_OTHER): Payer: Self-pay | Admitting: Anesthesiology

## 2015-11-25 ENCOUNTER — Ambulatory Visit (HOSPITAL_BASED_OUTPATIENT_CLINIC_OR_DEPARTMENT_OTHER)
Admission: RE | Admit: 2015-11-25 | Discharge: 2015-11-25 | Disposition: A | Discharge status: Home / Self Care | Payer: PPO | Source: Ambulatory Visit | Attending: General Surgery | Admitting: General Surgery

## 2015-11-25 ENCOUNTER — Encounter (HOSPITAL_BASED_OUTPATIENT_CLINIC_OR_DEPARTMENT_OTHER)
Admission: RE | Disposition: A | Discharge status: Home / Self Care | Payer: Self-pay | Source: Ambulatory Visit | Attending: General Surgery

## 2015-11-25 DIAGNOSIS — Z885 Allergy status to narcotic agent status: Secondary | ICD-10-CM | POA: Diagnosis not present

## 2015-11-25 DIAGNOSIS — Z91041 Radiographic dye allergy status: Secondary | ICD-10-CM | POA: Diagnosis not present

## 2015-11-25 DIAGNOSIS — Z881 Allergy status to other antibiotic agents status: Secondary | ICD-10-CM | POA: Diagnosis not present

## 2015-11-25 DIAGNOSIS — C50912 Malignant neoplasm of unspecified site of left female breast: Secondary | ICD-10-CM | POA: Diagnosis not present

## 2015-11-25 DIAGNOSIS — C493 Malignant neoplasm of connective and soft tissue of thorax: Secondary | ICD-10-CM | POA: Diagnosis not present

## 2015-11-25 DIAGNOSIS — Z888 Allergy status to other drugs, medicaments and biological substances status: Secondary | ICD-10-CM | POA: Insufficient documentation

## 2015-11-25 DIAGNOSIS — Z880 Allergy status to penicillin: Secondary | ICD-10-CM | POA: Insufficient documentation

## 2015-11-25 DIAGNOSIS — Z882 Allergy status to sulfonamides status: Secondary | ICD-10-CM | POA: Insufficient documentation

## 2015-11-25 DIAGNOSIS — Z79899 Other long term (current) drug therapy: Secondary | ICD-10-CM | POA: Diagnosis not present

## 2015-11-25 HISTORY — PX: MINOR BREAST BIOPSY: SHX5977

## 2015-11-25 SURGERY — MINOR BREAST BIOPSY
Anesthesia: LOCAL | Site: Chest | Laterality: Left

## 2015-11-25 MED ORDER — HYDROCODONE-ACETAMINOPHEN 5-325 MG PO TABS: 1.0000 | ORAL_TABLET | Freq: Four times a day (QID) | ORAL | Status: DC | PRN

## 2015-11-25 MED ORDER — LIDOCAINE-EPINEPHRINE (PF) 1 %-1:200000 IJ SOLN
INTRAMUSCULAR | Status: DC | PRN
  Administered 2015-11-25: 16 mL

## 2015-11-25 MED ORDER — LIDOCAINE-EPINEPHRINE (PF) 1 %-1:200000 IJ SOLN
INTRAMUSCULAR | Status: AC
  Filled 2015-11-25: qty 30

## 2015-11-25 MED ORDER — CHLORHEXIDINE GLUCONATE 4 % EX LIQD: 1.0000 "application " | Freq: Once | CUTANEOUS | Status: DC

## 2015-11-25 MED ORDER — LIDOCAINE HCL (PF) 1 % IJ SOLN
INTRAMUSCULAR | Status: AC
  Filled 2015-11-25: qty 30

## 2015-11-25 MED ORDER — BUPIVACAINE HCL (PF) 0.25 % IJ SOLN
INTRAMUSCULAR | Status: AC
  Filled 2015-11-25: qty 30

## 2015-11-25 SURGICAL SUPPLY — 21 items
BLADE SURG 15 STRL LF DISP TIS (BLADE) ×1 IMPLANT
BLADE SURG 15 STRL SS (BLADE) ×2
CHLORAPREP W/TINT 26ML (MISCELLANEOUS) ×2 IMPLANT
DECANTER SPIKE VIAL GLASS SM (MISCELLANEOUS) IMPLANT
DRAPE UTILITY XL STRL (DRAPES) ×2 IMPLANT
GLOVE BIO SURGEON STRL SZ 6.5 (GLOVE) ×1 IMPLANT
GLOVE BIO SURGEON STRL SZ7.5 (GLOVE) ×2 IMPLANT
GLOVE BIOGEL PI IND STRL 7.0 (GLOVE) IMPLANT
GLOVE BIOGEL PI INDICATOR 7.0 (GLOVE) ×1
GOWN STRL REUS W/ TWL LRG LVL3 (GOWN DISPOSABLE) ×2 IMPLANT
GOWN STRL REUS W/TWL LRG LVL3 (GOWN DISPOSABLE) ×4
LIQUID BAND (GAUZE/BANDAGES/DRESSINGS) ×2 IMPLANT
NDL HYPO 25X1 1.5 SAFETY (NEEDLE) ×1 IMPLANT
NDL SAFETY ECLIPSE 18X1.5 (NEEDLE) IMPLANT
NEEDLE HYPO 18GX1.5 SHARP (NEEDLE) ×2
NEEDLE HYPO 25X1 1.5 SAFETY (NEEDLE) ×2 IMPLANT
SUT MON AB 4-0 PC3 18 (SUTURE) ×2 IMPLANT
SUT VIC AB 3-0 SH 27 (SUTURE) ×2
SUT VIC AB 3-0 SH 27X BRD (SUTURE) ×1 IMPLANT
SYR CONTROL 10ML LL (SYRINGE) ×2 IMPLANT
TOWEL OR NON WOVEN STRL DISP B (DISPOSABLE) ×1 IMPLANT

## 2015-11-25 NOTE — Op Note (Signed)
11/25/2015  8:25 AM  PATIENT:  Gloria [name]  82 y.o. female  PRE-OPERATIVE DIAGNOSIS:  RECURRENT LEFT BREAST CANCER  POST-OPERATIVE DIAGNOSIS:  RECURRENT LEFT BREAST CANCER  PROCEDURE:  Procedure(s): MINOR EXCISION MASS LEFT CHEST WALL (Left)  SURGEON:  Surgeon(s) and Role:    * Jovita Kussmaul, MD - Primary  PHYSICIAN ASSISTANT:   ASSISTANTS: none   ANESTHESIA:   local  EBL:     BLOOD ADMINISTERED:none  DRAINS: none   LOCAL MEDICATIONS USED:  LIDOCAINE   SPECIMEN:  Source of Specimen:  left chest wall nodule  DISPOSITION OF SPECIMEN:  PATHOLOGY  COUNTS:  YES  TOURNIQUET:  * No tourniquets in log *  DICTATION: .Dragon Dictation   After informed consent was obtained the patient was brought to the operating room and placed in supine position on the operating table. The patient's left chest wall was prepped with ChloraPrep, allowed to dry, and draped in usual sterile manner. There was a small mobile palpable mass in the subcutaneous tissue along the medial mastectomy superior skin flap just above the incision. The area around this mass was infiltrated with 1% lidocaine with epinephrine until a good field block was created. A small elliptical incision was made overlying the mass. The incision was carried through the skin and subcutaneous tissue sharply with the 15 blade knife until the mass was completely removed. The mass was then sent to pathology for further evaluation. The deep layer of the wound was closed with an interrupted 3-0 Vicryl stitch. The skin was then closed with interrupted 4-0 Monocryl subcuticular stitches. Dermabond dressings were applied. The patient tolerated the procedure well. At the end of the case all needle sponge and instrument counts were correct. The patient was then awakened and taken to recovery in stable condition.  PLAN OF CARE: Discharge to home after PACU  PATIENT DISPOSITION:  PACU - hemodynamically stable.   Delay start of Pharmacological  VTE agent (>24hrs) due to surgical blood loss or risk of bleeding: not applicable

## 2015-11-25 NOTE — H&P (Signed)
Gloria [name]  Location: [facility] Patient #: [id number] DOB: Mar 01, 1928 Divorced / Language: Cleophus Molt / Race: White Female   History of Present Illness Patient words: reck.  The patient is a 82 year old female who presents for a follow-up for Breast cancer. The patient is an 82 year old white female who is 5-1/2 years status post left mastectomy and sentinel node mapping for a T2 N1 left breast cancer. She was ER/PR positive and HER-2 negative with a Ki-67 of 17%. She recently developed a new nodule on her left medial chest wall. This was biopsied and came back as a recurrence of her breast cancer. She also had enlarged lymph nodes in the left axilla biopsied and these were benign.   Allergies  Penicillins Sulfa Drugs Adhesive Tape Iodinated Contrast Media Cephalexin *CEPHALOSPORINS* Ciprofloxacin *FLUOROQUINOLONES* Demerol *ANALGESICS - OPIOID* Quinolones  Medication History ALPRAZolam (0.25MG Tablet, Oral) Active. Brovana (15MCG/2ML Nebulized Soln, Inhalation) Active. Budesonide (0.5MG/2ML Suspension, Inhalation) Active. Doxepin HCl (10MG Capsule, Oral) Active. Fluzone High-Dose (0.5ML Susp Pref Syr, Intramuscular) Active. Losartan Potassium-HCTZ (50-12.5MG Tablet, Oral) Active. Triamcinolone Acetonide (0.025% Cream, External) Active. Medications Reconciled    Review of Systems  General Present- Chills. Not Present- Appetite Loss, Fatigue, Fever, Night Sweats, Weight Gain and Weight Loss. HEENT Present- Hearing Loss, Nose Bleed, Seasonal Allergies and Sinus Pain. Not Present- Earache, Hoarseness, Oral Ulcers, Ringing in the Ears, Sore Throat, Visual Disturbances, Wears glasses/contact lenses and Yellow Eyes. Breast Present- Breast Pain and Skin Changes. Not Present- Breast Mass and Nipple Discharge. Cardiovascular Present- Chest Pain and Leg Cramps. Not Present- Difficulty Breathing Lying Down, Palpitations, Rapid Heart Rate, Shortness of  Breath and Swelling of Extremities. Gastrointestinal Present- Difficulty Swallowing and Hemorrhoids. Not Present- Abdominal Pain, Bloating, Bloody Stool, Change in Bowel Habits, Chronic diarrhea, Constipation, Excessive gas, Gets full quickly at meals, Indigestion, Nausea, Rectal Pain and Vomiting. Female Genitourinary Present- Nocturia. Not Present- Frequency, Painful Urination, Pelvic Pain and Urgency. Musculoskeletal Present- Back Pain and Muscle Pain. Not Present- Joint Pain, Joint Stiffness, Muscle Weakness and Swelling of Extremities. Neurological Present- Numbness. Not Present- Decreased Memory, Fainting, Headaches, Seizures, Tingling, Tremor, Trouble walking and Weakness. Psychiatric Present- Anxiety. Not Present- Bipolar, Change in Sleep Pattern, Depression, Fearful and Frequent crying.  Vitals Weight: 127.4 lb Height: 61in Body Surface Area: 1.56 m Body Mass Index: 24.07 kg/m  Temp.: 69F(Temporal)  Pulse: 73 (Regular)  BP: 130/80 (Sitting, Left Arm, Standard)       Physical Exam  General Mental Status-Alert. General Appearance-Consistent with stated age. Hydration-Well hydrated. Voice-Normal.  Head and Neck Head-normocephalic, atraumatic with no lesions or palpable masses. Trachea-midline. Thyroid Gland Characteristics - normal size and consistency.  Eye Eyeball - Bilateral-Extraocular movements intact. Sclera/Conjunctiva - Bilateral-No scleral icterus.  Chest and Lung Exam Chest and lung exam reveals -quiet, even and easy respiratory effort with no use of accessory muscles and on auscultation, normal breath sounds, no adventitious sounds and normal vocal resonance. Inspection Chest Wall - Normal. Back - normal.  Breast Note: There is a 1 cm round palpable mobile nodule in the subcutaneous tissue of the medial left chest wall near the mastectomy incision. There is also a palpably enlarged lymph node high in the left axilla. There  is no palpable mass in the right breast. There is no palpable right axillary, supraclavicular, or cervical lymphadenopathy.   Cardiovascular Cardiovascular examination reveals -normal heart sounds, regular rate and rhythm with no murmurs and normal pedal pulses bilaterally.  Abdomen Inspection Inspection of the abdomen reveals - No Hernias.  Skin - Scar - no surgical scars. Palpation/Percussion Palpation and Percussion of the abdomen reveal - Soft, Non Tender, No Rebound tenderness, No Rigidity (guarding) and No hepatosplenomegaly. Auscultation Auscultation of the abdomen reveals - Bowel sounds normal.  Neurologic Neurologic evaluation reveals -alert and oriented x 3 with no impairment of recent or remote memory. Mental Status-Normal.  Musculoskeletal Normal Exam - Left-Upper Extremity Strength Normal and Lower Extremity Strength Normal. Normal Exam - Right-Upper Extremity Strength Normal and Lower Extremity Strength Normal.  Lymphatic Head & Neck  General Head & Neck Lymphatics: Bilateral - Description - Normal. Axillary  General Axillary Region: Bilateral - Description - Normal. Tenderness - Non Tender. Femoral & Inguinal  Generalized Femoral & Inguinal Lymphatics: Bilateral - Description - Normal. Tenderness - Non Tender.    Assessment & Plan  PRIMARY CANCER OF LEFT FEMALE BREAST (C50.912) Impression: The patient is 5-1/2 years status post left mastectomy for breast cancer. She has recently developed a small recurrence on her left medial chest wall. At this point I would recommend excising the recurrence. We will refer her to the medical and radiation oncology to talk about adjuvant therapy. I have discussed with her in detail the risks and benefits of the operation to do this as well as some of the technical aspects and she understands and wishes to proceed    Signed by Luella Cook, MD (11/03/2015 4:11 PM)

## 2015-11-25 NOTE — Interval H&P Note (Signed)
History and Physical Interval Note:  11/25/2015 7:52 AM  Gloria [name]  has presented today for surgery, with the diagnosis of RECURRENT LEFT BREAST CANCER  The various methods of treatment have been discussed with the patient and family. After consideration of risks, benefits and other options for treatment, the patient has consented to  Procedure([name]): MINOR EXCISION MASS LEFT CHEST WALL (Left) as a surgical intervention .  The patient'[name] history has been reviewed, patient examined, no change in status, stable for surgery.  I have reviewed the patient'[name] chart and labs.  Questions were answered to the patient'[name] satisfaction.     TOTH III,Gloria [name]

## 2015-11-26 ENCOUNTER — Encounter (HOSPITAL_BASED_OUTPATIENT_CLINIC_OR_DEPARTMENT_OTHER): Payer: Self-pay | Admitting: General Surgery

## 2015-11-29 DIAGNOSIS — N390 Urinary tract infection, site not specified: Secondary | ICD-10-CM | POA: Diagnosis not present

## 2015-12-09 ENCOUNTER — Ambulatory Visit (HOSPITAL_BASED_OUTPATIENT_CLINIC_OR_DEPARTMENT_OTHER): Payer: PPO | Admitting: Hematology

## 2015-12-09 ENCOUNTER — Telehealth: Payer: Self-pay | Admitting: Hematology

## 2015-12-09 ENCOUNTER — Ambulatory Visit (HOSPITAL_BASED_OUTPATIENT_CLINIC_OR_DEPARTMENT_OTHER): Payer: PPO

## 2015-12-09 ENCOUNTER — Encounter: Payer: Self-pay | Admitting: Hematology

## 2015-12-09 VITALS — BP 137/71 | HR 80 | Temp 97.4°F | Resp 18 | Ht 60.0 in | Wt 125.9 lb

## 2015-12-09 DIAGNOSIS — C50912 Malignant neoplasm of unspecified site of left female breast: Secondary | ICD-10-CM

## 2015-12-09 DIAGNOSIS — C44501 Unspecified malignant neoplasm of skin of breast: Secondary | ICD-10-CM

## 2015-12-09 DIAGNOSIS — C50212 Malignant neoplasm of upper-inner quadrant of left female breast: Secondary | ICD-10-CM

## 2015-12-09 DIAGNOSIS — C50112 Malignant neoplasm of central portion of left female breast: Secondary | ICD-10-CM | POA: Insufficient documentation

## 2015-12-09 LAB — CBC WITH DIFFERENTIAL/PLATELET
BASO%: 0.8 % (ref 0.0–2.0)
Basophils Absolute: 0 10*3/uL (ref 0.0–0.1)
EOS%: 4 % (ref 0.0–7.0)
Eosinophils Absolute: 0.2 10*3/uL (ref 0.0–0.5)
HCT: 42.2 % (ref 34.8–46.6)
HGB: 13.8 g/dL (ref 11.6–15.9)
LYMPH%: 15.9 % (ref 14.0–49.7)
MCH: 30.3 pg (ref 25.1–34.0)
MCHC: 32.7 g/dL (ref 31.5–36.0)
MCV: 92.7 fL (ref 79.5–101.0)
MONO#: 0.4 10*3/uL (ref 0.1–0.9)
MONO%: 7.3 % (ref 0.0–14.0)
NEUT%: 72 % (ref 38.4–76.8)
NEUTROS ABS: 4 10*3/uL (ref 1.5–6.5)
PLATELETS: 158 10*3/uL (ref 145–400)
RBC: 4.55 10*6/uL (ref 3.70–5.45)
RDW: 13.5 % (ref 11.2–14.5)
WBC: 5.6 10*3/uL (ref 3.9–10.3)
lymph#: 0.9 10*3/uL (ref 0.9–3.3)

## 2015-12-09 LAB — COMPREHENSIVE METABOLIC PANEL
ALT: 13 U/L (ref 0–55)
ANION GAP: 9 meq/L (ref 3–11)
AST: 20 U/L (ref 5–34)
Albumin: 3.8 g/dL (ref 3.5–5.0)
Alkaline Phosphatase: 86 U/L (ref 40–150)
BILIRUBIN TOTAL: 0.75 mg/dL (ref 0.20–1.20)
BUN: 16 mg/dL (ref 7.0–26.0)
CHLORIDE: 107 meq/L (ref 98–109)
CO2: 26 meq/L (ref 22–29)
CREATININE: 1 mg/dL (ref 0.6–1.1)
Calcium: 9.5 mg/dL (ref 8.4–10.4)
EGFR: 54 mL/min/{1.73_m2} — ABNORMAL LOW (ref >90)
GLUCOSE: 89 mg/dL (ref 70–140)
Potassium: 4.3 mEq/L (ref 3.5–5.1)
SODIUM: 142 meq/L (ref 136–145)
TOTAL PROTEIN: 6.8 g/dL (ref 6.4–8.3)

## 2015-12-09 MED ORDER — ANASTROZOLE 1 MG PO TABS: 1.0000 mg | ORAL_TABLET | Freq: Every day | ORAL | Status: DC

## 2015-12-09 NOTE — Progress Notes (Signed)
Gloria [name]  [phone] [phone]  OFFICE PROGRESS NOTE  PATIENT: Gloria [name]   DOB: 12/17/1927  MR#: [id number]  CSN#:[id number]  CC:STONEKING,HAL THOMAS, MD Gloria J. Murray, MD Gloria S. Toth III, MD  DIAGNOSIS:  An 82-year-old [place] woman with invasive lobular carcinoma of the left breast diagnosed in 07/2010, chest wall local recurrence in 09/2015 .    Cancer of central portion of left female breast (HCC)   08/03/2010 Mammogram  left nipple retraction, and the palpable mass subareolar left breast at the 6:00 position. Ultrasound confirmed the presence of a mass measuring 2.6 x 1.7 x 2.4 cm   08/04/2010 Initial Diagnosis Cancer of central portion of left female breast (HCC)   08/04/2010 Initial Biopsy left breast mass biopsy showed an invasive mammary carcinoma with lobular features.   08/04/2010 Receptors her2 ER 95%, PR 97%, Ki-67 17%, HER-2/neu (-)   08/11/2010 Imaging left breast mass 4.6 x 4.2 x 2.3 cm with enhancement distortion extending to the nipple retraction. There is questionable anterior mediastinal lymph node seen   07/2010 - 09/03/2010 Anti-estrogen oral therapy neoadjuvant letrozole    09/03/2010 Surgery  left breast mastectomy with sentinel node biopsy on 09/03/2010.   08/2010 - 01/2015 Anti-estrogen oral therapy Tamoxifen, pt stopped on her own    12/03/2010 - 01/23/2011 Radiation Therapy left chest wall and axilla radiaiton    10/19/2015 Progression she developed left chest wall recurrence, s/p resection on 11/25/2015 wtih positive margins      CURRENT THERAPY:  Observation    INTERVAL HISTORY:  Gloria [name] is returns for follow up. She can mean by herself today. She was found to have a small left chest wall recurrence in December 2016, and had surgical resection by Dr. Toth on 11/25/2015. She tolerated surgery very well, and has recovered well. She is here for follow-up. She denies any other significant pain, dyspnea, or GI symptoms.   PAST  MEDICAL HISTORY: Past Medical History  Diagnosis Date  . Cancer (HCC)     breast- left  . GERD (gastroesophageal reflux disease)   . Hypertension   . Anxiety   . Depression   . Blood in urine   . Knee fracture   . CAD (coronary artery disease)     non obstructive by cath  . TR (tricuspid regurgitation)     mild by Echo 12/2008 EF >55%    PAST SURGICAL HISTORY: Past Surgical History  Procedure Laterality Date  . Tonsillectomy    . Breast surgery  08-30-10    mastectomy  . Colon surgery    . Abdominal hysterectomy    . Cardiac catheterization  08/2011    20% LAD stenosis, 20% diagonal stenosis, 10-20% proximal dominant RCA stenosis  . Left heart catheterization with coronary angiogram N/A 09/08/2011    Procedure: LEFT HEART CATHETERIZATION WITH CORONARY ANGIOGRAM;  Surgeon: Thomas A Kelly, MD;  Location: [facility] CATH LAB;  Service: Cardiovascular;  Laterality: N/A;  . Abdominal aortagram N/A 09/08/2011    Procedure: ABDOMINAL AORTAGRAM;  Surgeon: Thomas A Kelly, MD;  Location: [facility] CATH [facility];  Service: Cardiovascular;  Laterality: N/A;  . Minor breast biopsy Left 11/25/2015    Procedure: MINOR EXCISION MASS LEFT CHEST WALL;  Surgeon: Gloria Toth III, MD;  Location: [name];  Service: General;  Laterality: Left;    FAMILY HISTORY: Family History  Problem Relation Age of Onset  . Cancer Father     prostate  . Cancer Sister       breast  . Heart disease Sister   . Cancer Brother     pancreatic  . Cancer Maternal Grandmother     colon  . Dementia Mother   . Sudden death Brother   . Heart attack Brother   . Heart attack Brother   . Arthritis Sister     SOCIAL HISTORY: Social History  Substance Use Topics  . Smoking status: Former Smoker    Quit date: 11/15/1991  . Smokeless tobacco: Never Used  . Alcohol Use: 1.2 oz/week    2 Glasses of wine per week     Comment: 1 glass per week    ALLERGIES: Allergies  Allergen Reactions  . Contrast Media [Iodinated  Diagnostic Agents] Anaphylaxis  . Adhesive [Tape] Other (See Comments)    blisters  . Cephalexin Swelling  . Ciprofloxacin Swelling  . Demerol Nausea And Vomiting  . Penicillins Swelling    REACTION: allergic to penicillin  . Sulfamethoxazole-Trimethoprim Itching and Swelling    REACTION: swelling/hives  . Quinolones Itching and Rash    REACTION: itching, rash Pt. Reports no problems with levaquin     MEDICATIONS:  Current Outpatient Prescriptions  Medication Sig Dispense Refill  . ALPRAZolam (XANAX) 0.25 MG tablet Take 0.25 mg by mouth at bedtime.     . arformoterol (BROVANA) 15 MCG/2ML NEBU Take 2 mLs (15 mcg total) by nebulization every 12 (twelve) hours. 120 mL 2  . aspirin 81 MG chewable tablet Chew 81 mg by mouth daily.      . Calcium Carbonate-Vitamin D (CALTRATE 600+D PO) Take 1 tablet by mouth daily.     . cetirizine (ZYRTEC) 10 MG tablet Take 10 mg by mouth as needed.     . doxepin (SINEQUAN) 10 MG capsule Take 10 mg by mouth at bedtime.     . fluticasone (FLONASE) 50 MCG/ACT nasal spray Place 1 spray into both nostrils daily as needed.     . guaiFENesin (MUCINEX) 600 MG 12 hr tablet Take 1 to 2 every 12 hours as needed for cough/congestion/thick mucus    . HYDROcodone-acetaminophen (NORCO) 5-325 MG tablet Take 1-2 tablets by mouth every 6 (six) hours as needed. 30 tablet 0  . losartan (COZAAR) 25 MG tablet Take 50 mg by mouth daily.  0  . MAGNESIUM PO Take 250 mg by mouth daily.     . nitroGLYCERIN (NITROSTAT) 0.4 MG SL tablet Place 1 tablet (0.4 mg total) under the tongue every 5 (five) minutes as needed for chest pain. <PLEASE MAKE APPOINTMENT> (Patient not taking: Reported on 09/02/2015) 25 tablet 0   No current facility-administered medications for this visit.      REVIEW OF SYSTEMS: A 10 point review of systems was completed and is negative except as noted above.   Health Maintenance  Mammogram: 10/19/2015   PHYSICAL EXAMINATION: BP 137/71 mmHg  Pulse 80   Temp(Src) 97.4 F (36.3 C) (Oral)  Resp 18  Ht 5' (1.524 m)  Wt 125 lb 14.4 oz (57.108 kg)  BMI 24.59 kg/m2  SpO2 98%  GENERAL: Patient is a well appearing female in no acute distress HEENT:  Sclerae anicteric.  Oropharynx clear and moist. No ulcerations or evidence of oropharyngeal candidiasis. Neck is supple.  NODES:   There is no cervical , supraclavicular, or axillary lymphadenopathy palpated.  BREAST EXAM:  Left breast is surgically absent,  continue incision in the left frontal chest is healing well, no surrounding skin erythema or discharge. Right breast no masses or nodules.   LUNGS:    Clear to auscultation bilaterally.  No wheezes or rhonchi. HEART:  Regular rate and rhythm. No murmur appreciated. ABDOMEN:  Soft, nontender.  Positive, normoactive bowel sounds. No organomegaly palpated. MSK:  No focal spinal tenderness to palpation. Full range of motion bilaterally in the upper extremities. EXTREMITIES:  No peripheral edema.   SKIN:  Clear with no obvious rashes or skin changes. No nail dyscrasia. NEURO:  Nonfocal. Well oriented.  Appropriate affect.  ECOG FS:  Grade 1 - Symptomatic but completely ambulatory   LAB RESULTS: CBC Latest Ref Rng 12/09/2015 09/02/2015 02/26/2015  WBC 3.9 - 10.3 10e3/uL 5.6 4.0 4.1  Hemoglobin 11.6 - 15.9 g/dL 13.8 13.5 12.3  Hematocrit 34.8 - 46.6 % 42.2 40.9 37.2  Platelets 145 - 400 10e3/uL 158 137(L) 117(L)      CMP Latest Ref Rng 12/09/2015 09/02/2015 02/26/2015  Glucose 70 - 140 mg/dl 89 89 118  BUN 7.0 - 26.0 mg/dL 16.0 17.0 16.9  Creatinine 0.6 - 1.1 mg/dL 1.0 1.1 0.9  Sodium 136 - 145 mEq/L 142 143 142  Potassium 3.5 - 5.1 mEq/L 4.3 4.5 4.0  CO2 22 - 29 mEq/L _0 Calcium 8.4 - 10.4 mg/dL 9.5 9.7 9.0  Total Protein 6.4 - 8.3 g/dL 6.8 6.3(L) 5.8(L)  Total Bilirubin 0.20 - 1.20 mg/dL 0.75 0.56 0.42  Alkaline Phos 40 - 150 U/L 86 85 54  AST 5 - 34 U/L _1 ALT 0 - 55 U/L _2 PATHOLOGY REPORT Diagnosis 11/25/2015 Soft  tissue mass, simple excision, Left chest wall - INVASIVE LOBULAR CARCINOMA, PRESENT AT NON ORIENTED TISSUE EDGE(S). - SEE COMMENT. Microscopic Comment The carcinoma appears grade 2. A breast prognostic profile will be performed and the results reported separately. (JDP:gt, 11/26/15).  Results: IMMUNOHISTOCHEMICAL AND MORPHOMETRIC ANALYSIS PERFORMED MANUALLY Estrogen Receptor: 95%, POSITIVE, STRONG STAINING INTENSITY Progesterone Receptor: 60%, POSITIVE, STRONG STAINING INTENSITY Results: HER2 - NEGATIVE RATIO OF HER2/CEP17 SIGNALS 1.07 AVERAGE HER2 COPY NUMBER PER CELL 2.90   RADIOGRAPHIC STUDIES: Mammogram and [place] 10/12/2015 IMPRESSION: 1. There is a 7 mm mass centrally in the left chest at the mastectomy site. While this is suspected to represent fat necrosis, a solid mass cannot be excluded.  2. The palpable area of concern by the patient corresponds with normal subcutaneous fat by mammogram and ultrasound.  3. No mammographic evidence of right breast malignancy.  RECOMMENDATION: 1. Ultrasound-guided biopsy is recommended for the 7 mm mass in the central left breast. This has been scheduled for 10/19/2015 at 2 p.m.  2. No mammographic or targeted sonographic correlate for the palpable area of concern in the medial left breast. Any further workup of the palpable area should be based on clinical assessment.  I have discussed the findings and recommendations with the patient. Results were also provided in writing at the conclusion of the visit. If applicable, a reminder letter will be sent to the patient regarding the next appointment.  ASSESSMENT/PLAN: 82 y.o. with:  1. Stage IIB, T2 N1 invasive lobular carcinoma, grade 2, ER 95%, PR 97%, Ki-67 17%, HER-2/neu no amplification, diagnosed in 07/2010, local chest wall recurrence in 09/2015   -She was on adjuvant tamoxifen for 4-1/2 years, stopped on her own. -She unfortunately developed local recurrence 8 months after  she stopped tamoxifen. -I reviewed her surgical pathology findings, which is consistent with lobular carcinoma recurrence from her previous breast cancer. -The tumor was surgically removed, with positive margins. -I recommend her to have a restaging CT  scan and bone scan to ruled out distant metastasis -I'll refer her to radiation oncology to see if re-radiation is possible. She has radiation 5 years ago, we reviewed the risk of rib fracture from radiation -I strongly recommend her to start antiestrogen therapy with aromatase inhibitor. The potential side effects, which includes but not limited to, hot flash, scan of vaginal dryness, slightly increased risk of cardiovascular disease, osteoporosis, muscular and joint discomfort, etc. were explained to patient. She agrees to proceed. -I called in anastrozole to her pharmacy  Plan -lab today -CT chest, abdomen and pelvis with contrast, and a bone scan in the next few weeks, I will call her after her scan -She will call her primary care physician and equal to get a bone density scan -I called in anastrozole to her pharmacy today, she'll start in the next few days -Radiation oncology referral -I'll see her back in 2 months.  I spent 25 minutes counseling the patient face to face.  The total time spent in the appointment was 30 minutes.  ,   12/09/2015  

## 2015-12-09 NOTE — Telephone Encounter (Signed)
per pof to sch ptgave pt copy of avs-[facility] all to sch scan-sent [facility] email to adv to put scans orders in

## 2015-12-10 ENCOUNTER — Telehealth: Payer: Self-pay | Admitting: Hematology

## 2015-12-10 NOTE — Telephone Encounter (Signed)
per pof to sch pt has rad on referral-pt aware they will call to sch

## 2015-12-16 ENCOUNTER — Ambulatory Visit
Admission: RE | Admit: 2015-12-16 | Discharge: 2015-12-16 | Disposition: A | Discharge status: Home / Self Care | Payer: PPO | Source: Ambulatory Visit | Attending: Radiation Oncology | Admitting: Radiation Oncology

## 2015-12-16 ENCOUNTER — Ambulatory Visit: Payer: PPO

## 2015-12-16 DIAGNOSIS — Z510 Encounter for antineoplastic radiation therapy: Secondary | ICD-10-CM | POA: Insufficient documentation

## 2015-12-16 DIAGNOSIS — C50112 Malignant neoplasm of central portion of left female breast: Secondary | ICD-10-CM | POA: Insufficient documentation

## 2015-12-16 DIAGNOSIS — Z170 Estrogen receptor positive status [ER+]: Secondary | ICD-10-CM | POA: Insufficient documentation

## 2015-12-23 ENCOUNTER — Ambulatory Visit (HOSPITAL_COMMUNITY)
Admission: RE | Admit: 2015-12-23 | Discharge: 2015-12-23 | Disposition: A | Discharge status: Home / Self Care | Payer: PPO | Source: Ambulatory Visit | Attending: Hematology | Admitting: Hematology

## 2015-12-23 ENCOUNTER — Encounter (HOSPITAL_COMMUNITY)
Admission: RE | Admit: 2015-12-23 | Discharge: 2015-12-23 | Disposition: A | Discharge status: Home / Self Care | Payer: PPO | Source: Ambulatory Visit | Attending: Hematology | Admitting: Hematology

## 2015-12-23 ENCOUNTER — Other Ambulatory Visit: Payer: Self-pay | Admitting: Hematology

## 2015-12-23 DIAGNOSIS — C50212 Malignant neoplasm of upper-inner quadrant of left female breast: Secondary | ICD-10-CM

## 2015-12-23 DIAGNOSIS — R222 Localized swelling, mass and lump, trunk: Secondary | ICD-10-CM | POA: Insufficient documentation

## 2015-12-23 DIAGNOSIS — R0781 Pleurodynia: Secondary | ICD-10-CM | POA: Diagnosis not present

## 2015-12-23 DIAGNOSIS — Z9012 Acquired absence of left breast and nipple: Secondary | ICD-10-CM | POA: Diagnosis not present

## 2015-12-23 DIAGNOSIS — C50312 Malignant neoplasm of lower-inner quadrant of left female breast: Secondary | ICD-10-CM | POA: Diagnosis not present

## 2015-12-23 DIAGNOSIS — C50912 Malignant neoplasm of unspecified site of left female breast: Secondary | ICD-10-CM | POA: Diagnosis not present

## 2015-12-23 MED ORDER — TECHNETIUM TC 99M MEDRONATE IV KIT
27.5000 | PACK | Freq: Once | INTRAVENOUS | Status: AC | PRN
  Administered 2015-12-23: 27.5 via INTRAVENOUS

## 2015-12-23 NOTE — Progress Notes (Signed)
Radiation Oncology         [phone]) [phone] ________________________________  Initial outpatient Consultation - Date: 12/24/2015   Name: Gloria [name] MRN: [id number]   DOB: [birth year]  REFERRING PHYSICIAN: Truitt Merle, MD  DIAGNOSIS AND STAGE: Cancer of central portion of left female breast [facility])   Staging form: Breast, AJCC 7th Edition     Pathologic stage from 08/04/2010: Stage IIB (T2, N1a, cM0) - Signed by Truitt Merle, MD on 12/09/2015  Recurrent lobular carc of left chest wall with positive margins  HISTORY OF PRESENT ILLNESS::Gloria [name] is a 82 y.o. female. She was previously treated with left mastetcomy in 2011. She was treated with axillary radiation which she completed in March of 2012. She presented to [name] in November of last year with complaints of chest wall tenderness. She had been on tamoxifen and had recently stopped.  Physical exam at that time showed tickening and tenderness at the medial area of the left mastectomy scar. Biopsy on 09/2015 was invasive lobular carcinoma. She underwent surgical resection of this lesion on Jan 26. This showed invasive lobular carcinoma ER PR + her2-neg. All margins were positive. She has seen [name] who has ordered staging studies and recommended restarting her antiestrogen therapy with anastrozole. She had her CT of the CAP and bone scan all were negative for metastatic disease.  She does not want to take anastrazole but will take tamoxifen. She has healed up well from her surgery. She is accompanied by her daughter. She presents today for my opinion regarding management of her recurrent chest wall disease.   PREVIOUS RADIATION THERAPY: Yes - left axilla and SCLV fossa to 52.8 Gy completed 01/03/2011. Her chest wall was not radiated.   Past medical, social and family history were reviewed in the electronic chart. Review of symptoms was reviewed in the electronic chart. Medications were reviewed in the electronic chart.   PHYSICAL EXAM:  Filed  Vitals:   12/24/15 1530  BP: 131/76  Pulse: 96  Temp: 98 F (36.7 C)  .124 lb (56.246 kg). S/p left mastectomy. New incision is healing well.   IMPRESSION: Chest wall recurrence with previous axillary and SCLV radiation but no previous chest wall radiation.   PLAN: I spoke to the patient today regarding her diagnosis and options for treatment. . We discussed the role of radiation in decreasing local failures in patients who undergo mastectomy and have recurrence. Since she has not received previous radiation, we will treat her entire chest wall, leaving room for no overlap near her axilla and SCLV.     We discussed the process of simulation and the placement tattoos. We discussed 6 weeks of treatment as an outpatient. We discussed the possibility of asymptomatic lung damage. We discussed the low likelihood of secondary malignancies. We discussed the possible side effects including but not limited to skin redness, fatigue, permanent skin darkening, and chest wall swelling.   We will contact her regarding scheduling simulation. She would also like to speak with a financial counselor which we can arrange. I gave her Meredith's number and encouraged her to call on Monday.   I spent 40 minutes  face to face with the patient and more than 50% of that time was spent in counseling and/or coordination of care.   ------------------------------------------------  [name], MD  This document serves as a record of services personally performed by [name], MD. It was created on her behalf by Jenell Milliner, a trained medical scribe. The creation  of this record is based on the scribe's personal observations and the provider's statements to them. This document has been checked and approved by the attending provider.

## 2015-12-24 ENCOUNTER — Encounter: Payer: Self-pay | Admitting: Radiation Oncology

## 2015-12-24 ENCOUNTER — Ambulatory Visit
Admission: RE | Admit: 2015-12-24 | Discharge: 2015-12-24 | Disposition: A | Discharge status: Home / Self Care | Payer: PPO | Source: Ambulatory Visit | Attending: Radiation Oncology | Admitting: Radiation Oncology

## 2015-12-24 ENCOUNTER — Inpatient Hospital Stay
Admission: RE | Admit: 2015-12-24 | Discharge: 2015-12-24 | Disposition: A | Discharge status: Home / Self Care | Payer: PPO | Source: Ambulatory Visit | Admitting: Radiation Oncology

## 2015-12-24 VITALS — BP 131/76 | HR 96 | Temp 98.0°F | Ht 60.0 in | Wt 124.0 lb

## 2015-12-24 DIAGNOSIS — C50112 Malignant neoplasm of central portion of left female breast: Secondary | ICD-10-CM

## 2015-12-24 DIAGNOSIS — Z510 Encounter for antineoplastic radiation therapy: Secondary | ICD-10-CM | POA: Diagnosis not present

## 2015-12-24 DIAGNOSIS — Z170 Estrogen receptor positive status [ER+]: Secondary | ICD-10-CM | POA: Diagnosis not present

## 2015-12-24 NOTE — Progress Notes (Addendum)
Location of Breast Cancer: Left Local  Chest Wall Recurrence Lobular Cancer Histology per Pathology Report: 11-25-15 Diagnosis Soft tissue mass, simple excision, Left chest wall - INVASIVE LOBULAR CARCINOMA, PRESENT AT NON ORIENTED TISSUE EDGE(S). - SEE COMMENT. Microscopic Comment Receptor Status: ER(95%), PR (60%), Her2-neu (-)  Gloria [name]  presented with  a small left chest wall recurrence in December 2016 8 months after stopping Tamoxifen.   Past/Anticipated interventions by surgeon, if [id number] by Dr. Marlou Starks on 11/25/2015   Past/Anticipated interventions by medical oncology, if KDT:[id number] Anastrozole ,"Says she has stopped taking it on her own and restarted Tamoxifen".  Asked to speak with her medical oncologist.  08-24-16 Left Breast Biopsy, 10-19-15 Mammogram  Lymphedema issues, if any: No   Pain issues, if any: Mid Back 3/10 not taking anything for pain repositions  SAFETY ISSUES: Prior radiation? 12/03/2010 - 01/23/2011  left chest wall and axilla radiaiton    Pacemaker/ICD? No  Possible current pregnancy?No  Is the patient on methotrexate? No  Current Complaints / other details:   Menarche age14, G2,P2, Menopause age41, BC No, HRT 15 years  BP 131/76 mmHg  Pulse 96  Temp(Src) 98 F (36.7 C) (Oral)  Ht 5' (1.524 m)  Wt 124 lb (56.246 kg)  BMI 24.22 kg/m2  SpO2 96%  Wt Readings from Last 3 Encounters:  12/24/15 124 lb (56.246 kg)  12/09/15 125 lb 14.4 oz (57.108 kg)  09/02/15 126 lb 11.2 oz (57.471 kg)   Gloria Spurling, RN 12/24/2015,3:43 PM

## 2015-12-30 ENCOUNTER — Ambulatory Visit
Admission: RE | Admit: 2015-12-30 | Discharge: 2015-12-30 | Disposition: A | Discharge status: Home / Self Care | Payer: PPO | Source: Ambulatory Visit | Attending: Radiation Oncology | Admitting: Radiation Oncology

## 2015-12-30 ENCOUNTER — Encounter: Payer: Self-pay | Admitting: Radiation Oncology

## 2015-12-30 DIAGNOSIS — Z510 Encounter for antineoplastic radiation therapy: Secondary | ICD-10-CM | POA: Diagnosis not present

## 2015-12-30 DIAGNOSIS — C50112 Malignant neoplasm of central portion of left female breast: Secondary | ICD-10-CM | POA: Diagnosis not present

## 2015-12-31 DIAGNOSIS — C50112 Malignant neoplasm of central portion of left female breast: Secondary | ICD-10-CM | POA: Diagnosis not present

## 2015-12-31 DIAGNOSIS — Z510 Encounter for antineoplastic radiation therapy: Secondary | ICD-10-CM | POA: Diagnosis not present

## 2016-01-03 ENCOUNTER — Encounter: Payer: Self-pay | Admitting: Radiation Oncology

## 2016-01-03 ENCOUNTER — Ambulatory Visit
Admission: RE | Admit: 2016-01-03 | Discharge: 2016-01-03 | Disposition: A | Discharge status: Home / Self Care | Payer: PPO | Source: Ambulatory Visit | Attending: Radiation Oncology | Admitting: Radiation Oncology

## 2016-01-03 VITALS — BP 120/67 | HR 75 | Temp 97.7°F | Ht <= 58 in | Wt 122.9 lb

## 2016-01-03 DIAGNOSIS — C50112 Malignant neoplasm of central portion of left female breast: Secondary | ICD-10-CM

## 2016-01-03 DIAGNOSIS — Z510 Encounter for antineoplastic radiation therapy: Secondary | ICD-10-CM | POA: Diagnosis not present

## 2016-01-03 MED ORDER — ALRA NON-METALLIC DEODORANT (RAD-ONC)
1.0000 "application " | Freq: Once | TOPICAL | Status: AC
  Administered 2016-01-03: 1 via TOPICAL

## 2016-01-03 MED ORDER — RADIAPLEXRX EX GEL
Freq: Once | CUTANEOUS | Status: AC
  Administered 2016-01-03: 18:00:00 via TOPICAL

## 2016-01-03 MED ORDER — RADIAPLEXRX EX GEL: Freq: Once | CUTANEOUS | Status: DC

## 2016-01-03 MED ORDER — ALRA NON-METALLIC DEODORANT (RAD-ONC): 1.0000 "application " | Freq: Once | TOPICAL | Status: DC

## 2016-01-03 NOTE — Addendum Note (Signed)
Encounter addended by: Malena Edman, RN on: 01/03/2016  5:57 PM<BR>     Documentation filed: [facility], Inpatient MAR, Orders

## 2016-01-03 NOTE — Progress Notes (Signed)
Gloria [name] has received 1 fraction to her LCW. Skin normal pink to the LCW Radiplex gel given today and Alra deodorant and education given. Appetite is okay. Energy level is good. Denies pain , has had some SOB O2 sat 98%. States she is anxious today. Wt Readings from Last 3 Encounters:  01/03/16 122 lb 14.4 oz (55.747 kg)  12/24/15 124 lb (56.246 kg)  12/09/15 125 lb 14.4 oz (57.108 kg)   BP 120/67 mmHg  Pulse 75  Temp(Src) 97.7 F (36.5 C) (Oral)  Resp 16  Ht 5" (0.127 m)  Wt 122 lb 14.4 oz (55.747 kg)  BMI 3456.32 kg/m2  SpO2 98%

## 2016-01-03 NOTE — Progress Notes (Signed)
   Weekly Management Note:  Outpatient    ICD-9-CM ICD-10-CM   1. 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  CBCT/MVCT images/Port film x-rays were reviewed.  The chart was checked. No complaints.  Asks about Xanax Rx's.  Physical Findings:  height is 5" (0.127 m) and weight is 122 lb 14.4 oz (55.747 kg). Her oral temperature is 97.7 F (36.5 C). Her blood pressure is 120/67 and her pulse is 75. Her oxygen saturation is 98%.   Wt Readings from Last 3 Encounters:  01/03/16 122 lb 14.4 oz (55.747 kg)  01/03/16 122 lb 14.4 oz (55.747 kg)  12/24/15 124 lb (56.246 kg)   NAD, no skin changes thus far over left chest wall.  Impression:  The patient is tolerating radiotherapy.  Plan:  Continue radiotherapy as planned. Advised to discuss her Xanax refills with her PCP. ________________________________   Eppie Gibson, M.D.

## 2016-01-03 NOTE — Progress Notes (Deleted)
Mrs. Gloria [name] has received 1 fraction to her LCW.  Skin normal pink to the LCW Radiplex gel given today and Alra deodorant and education given.  Appetite is okay.  Energy level is good.  Denies pain , has had some SOB O2 sat 98%. States she is anxious today. Wt Readings from Last 3 Encounters:  01/03/16 122 lb 14.4 oz (55.747 kg)  12/24/15 124 lb (56.246 kg)  12/09/15 125 lb 14.4 oz (57.108 kg)   BP 120/67 mmHg  Pulse 75  Temp(Src) 97.7 F (36.5 C) (Oral)  Resp 16  Ht 5" (0.127 m)  Wt 122 lb 14.4 oz (55.747 kg)  BMI 3456.32 kg/m2  SpO2 98%

## 2016-01-04 ENCOUNTER — Ambulatory Visit
Admission: RE | Admit: 2016-01-04 | Discharge: 2016-01-04 | Disposition: A | Discharge status: Home / Self Care | Payer: PPO | Source: Ambulatory Visit | Attending: Radiation Oncology | Admitting: Radiation Oncology

## 2016-01-04 DIAGNOSIS — Z510 Encounter for antineoplastic radiation therapy: Secondary | ICD-10-CM | POA: Diagnosis not present

## 2016-01-05 ENCOUNTER — Ambulatory Visit
Admission: RE | Admit: 2016-01-05 | Discharge: 2016-01-05 | Disposition: A | Discharge status: Home / Self Care | Payer: PPO | Source: Ambulatory Visit | Attending: Radiation Oncology | Admitting: Radiation Oncology

## 2016-01-05 DIAGNOSIS — Z510 Encounter for antineoplastic radiation therapy: Secondary | ICD-10-CM | POA: Diagnosis not present

## 2016-01-06 ENCOUNTER — Ambulatory Visit
Admission: RE | Admit: 2016-01-06 | Discharge: 2016-01-06 | Disposition: A | Discharge status: Home / Self Care | Payer: PPO | Source: Ambulatory Visit | Attending: Radiation Oncology | Admitting: Radiation Oncology

## 2016-01-06 DIAGNOSIS — Z510 Encounter for antineoplastic radiation therapy: Secondary | ICD-10-CM | POA: Diagnosis not present

## 2016-01-07 ENCOUNTER — Ambulatory Visit
Admission: RE | Admit: 2016-01-07 | Discharge: 2016-01-07 | Disposition: A | Discharge status: Home / Self Care | Payer: PPO | Source: Ambulatory Visit | Attending: Radiation Oncology | Admitting: Radiation Oncology

## 2016-01-07 DIAGNOSIS — Z510 Encounter for antineoplastic radiation therapy: Secondary | ICD-10-CM | POA: Diagnosis not present

## 2016-01-10 ENCOUNTER — Ambulatory Visit
Admission: RE | Admit: 2016-01-10 | Discharge: 2016-01-10 | Disposition: A | Discharge status: Home / Self Care | Payer: PPO | Source: Ambulatory Visit | Attending: Radiation Oncology | Admitting: Radiation Oncology

## 2016-01-10 ENCOUNTER — Telehealth: Payer: Self-pay | Admitting: *Deleted

## 2016-01-10 DIAGNOSIS — C50112 Malignant neoplasm of central portion of left female breast: Secondary | ICD-10-CM | POA: Diagnosis not present

## 2016-01-10 DIAGNOSIS — Z510 Encounter for antineoplastic radiation therapy: Secondary | ICD-10-CM | POA: Diagnosis not present

## 2016-01-10 NOTE — Telephone Encounter (Signed)
Left message for a return phone to follow up after start of radiation.   

## 2016-01-11 ENCOUNTER — Encounter: Payer: Self-pay | Admitting: Radiation Oncology

## 2016-01-11 ENCOUNTER — Ambulatory Visit
Admission: RE | Admit: 2016-01-11 | Discharge: 2016-01-11 | Disposition: A | Discharge status: Home / Self Care | Payer: PPO | Source: Ambulatory Visit | Attending: Radiation Oncology | Admitting: Radiation Oncology

## 2016-01-11 DIAGNOSIS — Z510 Encounter for antineoplastic radiation therapy: Secondary | ICD-10-CM | POA: Diagnosis not present

## 2016-01-11 DIAGNOSIS — C50112 Malignant neoplasm of central portion of left female breast: Secondary | ICD-10-CM

## 2016-01-11 NOTE — Progress Notes (Signed)
Weekly Management Note Current Dose: 14  Gy  Projected Dose: 50 Gy   Narrative:  The patient presents for routine under treatment assessment.  CBCT/MVCT images/Port film x-rays were reviewed.  The chart was checked. She is nauseated after treatment. Helped by xanax but she is afraid if she takes 1/2 pill before treatment she will run out.   Physical Findings: Palpable nodules along her scar. No skin changes.   Impression:  The patient is tolerating radiation.  Plan:  Continue treatment as planned. She is going to take the xanax prior to treatment and we will reassess Tuesday. If it helps, I will refill for her. At her request I will send this to Dr. Felipa Eth.

## 2016-01-11 NOTE — Progress Notes (Addendum)
Gloria [name] has received 7 fractions to her left Chest wall.  Her main concern today is daily nausea since her 2nd fraction.  She appears fatigued at this time. Drinking water and protein shakes and eating 3 smalll meals daily.  C/o intermittent pain in her upper back, and upper abdominal region. Notes periods of dizziness when ambulatiing.  BP 134/78 mmHg  Pulse 76  Temp(Src) 97.7 F (36.5 C)  Ht 5" (0.127 m)  Wt 122 lb 14.4 oz (55.747 kg)  BMI 3456.32 kg/m2  SpO2 96%- sitting  BP 132/81 mmHg  Pulse 78  Temp(Src) 97.7 F (36.5 C)  Ht 5" (0.127 m)  Wt 122 lb 14.4 oz (55.747 kg)  BMI 3456.32 kg/m2  SpO2 96% - standing  Wt Readings from Last 3 Encounters:  01/11/16 122 lb 14.4 oz (55.747 kg)  01/03/16 122 lb 14.4 oz (55.747 kg)  01/03/16 122 lb 14.4 oz (55.747 kg)

## 2016-01-12 ENCOUNTER — Ambulatory Visit
Admission: RE | Admit: 2016-01-12 | Discharge: 2016-01-12 | Disposition: A | Discharge status: Home / Self Care | Payer: PPO | Source: Ambulatory Visit | Attending: Radiation Oncology | Admitting: Radiation Oncology

## 2016-01-12 DIAGNOSIS — Z510 Encounter for antineoplastic radiation therapy: Secondary | ICD-10-CM | POA: Diagnosis not present

## 2016-01-13 ENCOUNTER — Ambulatory Visit
Admission: RE | Admit: 2016-01-13 | Discharge: 2016-01-13 | Disposition: A | Discharge status: Home / Self Care | Payer: PPO | Source: Ambulatory Visit | Attending: Radiation Oncology | Admitting: Radiation Oncology

## 2016-01-13 DIAGNOSIS — Z510 Encounter for antineoplastic radiation therapy: Secondary | ICD-10-CM | POA: Diagnosis not present

## 2016-01-14 ENCOUNTER — Ambulatory Visit
Admission: RE | Admit: 2016-01-14 | Discharge: 2016-01-14 | Disposition: A | Discharge status: Home / Self Care | Payer: PPO | Source: Ambulatory Visit | Attending: Radiation Oncology | Admitting: Radiation Oncology

## 2016-01-14 DIAGNOSIS — Z510 Encounter for antineoplastic radiation therapy: Secondary | ICD-10-CM | POA: Diagnosis not present

## 2016-01-17 ENCOUNTER — Ambulatory Visit
Admission: RE | Admit: 2016-01-17 | Discharge: 2016-01-17 | Disposition: A | Discharge status: Home / Self Care | Payer: PPO | Source: Ambulatory Visit | Attending: Radiation Oncology | Admitting: Radiation Oncology

## 2016-01-17 DIAGNOSIS — Z510 Encounter for antineoplastic radiation therapy: Secondary | ICD-10-CM | POA: Diagnosis not present

## 2016-01-17 DIAGNOSIS — C50112 Malignant neoplasm of central portion of left female breast: Secondary | ICD-10-CM | POA: Diagnosis not present

## 2016-01-18 ENCOUNTER — Encounter: Payer: Self-pay | Admitting: Radiation Oncology

## 2016-01-18 ENCOUNTER — Ambulatory Visit
Admission: RE | Admit: 2016-01-18 | Discharge: 2016-01-18 | Disposition: A | Discharge status: Home / Self Care | Payer: PPO | Source: Ambulatory Visit | Attending: Radiation Oncology | Admitting: Radiation Oncology

## 2016-01-18 VITALS — HR 79 | Temp 97.9°F | Resp 16 | Ht <= 58 in | Wt 122.1 lb

## 2016-01-18 DIAGNOSIS — C50112 Malignant neoplasm of central portion of left female breast: Secondary | ICD-10-CM

## 2016-01-18 DIAGNOSIS — Z510 Encounter for antineoplastic radiation therapy: Secondary | ICD-10-CM | POA: Diagnosis not present

## 2016-01-18 MED ORDER — ALPRAZOLAM 0.25 MG PO TABS: 0.2500 mg | ORAL_TABLET | Freq: Every day | ORAL | Status: DC

## 2016-01-18 NOTE — Progress Notes (Signed)
Gloria [name] has received 12 fractions to LCW.  Skin to the treatment area looks normal pink using Radiaplex gel.  Appetite has gotten a little bit better over the past week.   Having fatigue taking rest breaks during the day.  Pain in LCW,,left area and mid back takes Xanax does not like to take Tylenol causes constipation. Pulse 79  Temp(Src) 97.9 F (36.6 C) (Oral)  Resp 16  Ht 5" (0.127 m)  Wt 122 lb 1.6 oz (55.384 kg)  BMI 3433.81 kg/m2  SpO2 100%

## 2016-01-18 NOTE — Progress Notes (Signed)
Weekly Management Note Current Dose: 24 Gy  Projected Dose: 50 Gy   Narrative:  The patient presents for routine under treatment assessment.  CBCT/MVCT images/Port film x-rays were reviewed.  The chart was checked. Xanax helping with nausea.  No skin changes. Nervous about running out of xanax and nervous about treatment. Arm/back/stomach pain "comes and goes"  Physical Findings: Palpable nodules along her scar are increased . No skin changes.   Impression:  The patient is tolerating radiation.  Plan:  Continue treatment as planned. Refilled xanax. She does not want anything for pain right now. These nodules continue to grow.

## 2016-01-19 ENCOUNTER — Ambulatory Visit
Admission: RE | Admit: 2016-01-19 | Discharge: 2016-01-19 | Disposition: A | Discharge status: Home / Self Care | Payer: PPO | Source: Ambulatory Visit | Attending: Radiation Oncology | Admitting: Radiation Oncology

## 2016-01-19 DIAGNOSIS — Z510 Encounter for antineoplastic radiation therapy: Secondary | ICD-10-CM | POA: Diagnosis not present

## 2016-01-20 ENCOUNTER — Ambulatory Visit
Admission: RE | Admit: 2016-01-20 | Discharge: 2016-01-20 | Disposition: A | Discharge status: Home / Self Care | Payer: PPO | Source: Ambulatory Visit | Attending: Radiation Oncology | Admitting: Radiation Oncology

## 2016-01-20 DIAGNOSIS — Z510 Encounter for antineoplastic radiation therapy: Secondary | ICD-10-CM | POA: Diagnosis not present

## 2016-01-21 ENCOUNTER — Ambulatory Visit
Admission: RE | Admit: 2016-01-21 | Discharge: 2016-01-21 | Disposition: A | Discharge status: Home / Self Care | Payer: PPO | Source: Ambulatory Visit | Attending: Radiation Oncology | Admitting: Radiation Oncology

## 2016-01-21 DIAGNOSIS — Z510 Encounter for antineoplastic radiation therapy: Secondary | ICD-10-CM | POA: Diagnosis not present

## 2016-01-24 ENCOUNTER — Ambulatory Visit
Admission: RE | Admit: 2016-01-24 | Discharge: 2016-01-24 | Disposition: A | Discharge status: Home / Self Care | Payer: PPO | Source: Ambulatory Visit | Attending: Radiation Oncology | Admitting: Radiation Oncology

## 2016-01-24 DIAGNOSIS — R300 Dysuria: Secondary | ICD-10-CM | POA: Diagnosis not present

## 2016-01-24 DIAGNOSIS — R3915 Urgency of urination: Secondary | ICD-10-CM | POA: Diagnosis not present

## 2016-01-24 DIAGNOSIS — C50112 Malignant neoplasm of central portion of left female breast: Secondary | ICD-10-CM | POA: Diagnosis not present

## 2016-01-24 DIAGNOSIS — Z510 Encounter for antineoplastic radiation therapy: Secondary | ICD-10-CM | POA: Diagnosis not present

## 2016-01-24 DIAGNOSIS — Z0000 Encounter for general adult medical examination without abnormal findings: Secondary | ICD-10-CM | POA: Diagnosis not present

## 2016-01-25 ENCOUNTER — Ambulatory Visit
Admission: RE | Admit: 2016-01-25 | Discharge: 2016-01-25 | Disposition: A | Discharge status: Home / Self Care | Payer: PPO | Source: Ambulatory Visit | Attending: Radiation Oncology | Admitting: Radiation Oncology

## 2016-01-25 ENCOUNTER — Encounter: Payer: Self-pay | Admitting: Radiation Oncology

## 2016-01-25 VITALS — BP 121/68 | HR 78 | Temp 97.9°F | Resp 16 | Ht <= 58 in | Wt 121.5 lb

## 2016-01-25 DIAGNOSIS — C50112 Malignant neoplasm of central portion of left female breast: Secondary | ICD-10-CM

## 2016-01-25 DIAGNOSIS — Z510 Encounter for antineoplastic radiation therapy: Secondary | ICD-10-CM | POA: Diagnosis not present

## 2016-01-25 MED ORDER — ALPRAZOLAM 0.25 MG PO TABS: ORAL_TABLET | ORAL | Status: DC

## 2016-01-25 NOTE — Progress Notes (Signed)
Weekly Management Note Current Dose: 34 Gy  Projected Dose: 50 Gy   Narrative:  The patient presents for routine under treatment assessment.  CBCT/MVCT images/Port film x-rays were reviewed.  The chart was checked. Being treated for a UTI.  Worried about xanax prescription that needs to be changed per her pharmacist. Son accompanies her today.    Physical Findings: Palpable nodules along her scar are smaller today . Skin is pink.   Impression:  The patient is tolerating radiation.  Plan:  Continue treatment as planned. Refilled xanax again. I am encouraged that nodules are smaller. Will continue RT for another week. Follow up in 2 weeks and assess response.

## 2016-01-25 NOTE — Progress Notes (Signed)
Gloria [name] has received 17 fractions to her LCW.  Skin to the treatment area is normal pink color.  Appetite was altered last week due to bladder infection taking Macrodantin has increased her fluid intake saw Dr. Lyndal Rainbow PA 01-24-16 Having fatigue most of the day.  Pain 3/10 to low back taking Tylenol as needed for pain control. BP 121/68 mmHg  Pulse 78  Temp(Src) 97.9 F (36.6 C)  Resp 16  Ht 5" (0.127 m)  Wt 121 lb 8 oz (55.112 kg)  BMI 3416.95 kg/m2  SpO2 98%

## 2016-01-26 ENCOUNTER — Ambulatory Visit
Admission: RE | Admit: 2016-01-26 | Discharge: 2016-01-26 | Disposition: A | Discharge status: Home / Self Care | Payer: PPO | Source: Ambulatory Visit | Attending: Radiation Oncology | Admitting: Radiation Oncology

## 2016-01-26 DIAGNOSIS — Z510 Encounter for antineoplastic radiation therapy: Secondary | ICD-10-CM | POA: Diagnosis not present

## 2016-01-27 ENCOUNTER — Ambulatory Visit
Admission: RE | Admit: 2016-01-27 | Discharge: 2016-01-27 | Disposition: A | Discharge status: Home / Self Care | Payer: PPO | Source: Ambulatory Visit | Attending: Radiation Oncology | Admitting: Radiation Oncology

## 2016-01-27 DIAGNOSIS — Z510 Encounter for antineoplastic radiation therapy: Secondary | ICD-10-CM | POA: Diagnosis not present

## 2016-01-28 ENCOUNTER — Ambulatory Visit
Admission: RE | Admit: 2016-01-28 | Discharge: 2016-01-28 | Disposition: A | Discharge status: Home / Self Care | Payer: PPO | Source: Ambulatory Visit | Attending: Radiation Oncology | Admitting: Radiation Oncology

## 2016-01-28 DIAGNOSIS — Z510 Encounter for antineoplastic radiation therapy: Secondary | ICD-10-CM | POA: Diagnosis not present

## 2016-01-31 ENCOUNTER — Ambulatory Visit
Admission: RE | Admit: 2016-01-31 | Discharge: 2016-01-31 | Disposition: A | Discharge status: Home / Self Care | Payer: PPO | Source: Ambulatory Visit | Attending: Radiation Oncology | Admitting: Radiation Oncology

## 2016-01-31 DIAGNOSIS — C50112 Malignant neoplasm of central portion of left female breast: Secondary | ICD-10-CM | POA: Diagnosis not present

## 2016-01-31 DIAGNOSIS — Z510 Encounter for antineoplastic radiation therapy: Secondary | ICD-10-CM | POA: Diagnosis not present

## 2016-02-01 ENCOUNTER — Ambulatory Visit
Admission: RE | Admit: 2016-02-01 | Discharge: 2016-02-01 | Disposition: A | Discharge status: Home / Self Care | Payer: PPO | Source: Ambulatory Visit | Attending: Radiation Oncology | Admitting: Radiation Oncology

## 2016-02-01 DIAGNOSIS — Z510 Encounter for antineoplastic radiation therapy: Secondary | ICD-10-CM | POA: Diagnosis not present

## 2016-02-01 DIAGNOSIS — C50112 Malignant neoplasm of central portion of left female breast: Secondary | ICD-10-CM

## 2016-02-01 NOTE — Progress Notes (Signed)
Weekly Management Note Current Dose: 44 Gy  Projected Dose: 50 Gy   Narrative:  The patient presents for routine under treatment assessment.  CBCT/MVCT images/Port film x-rays were reviewed.  The chart was checked. No pain. Worried that her UTI has not been treated.    Physical Findings: Palpable nodules along her scar are still smaller today . Skin is pink.   Impression:  The patient is tolerating radiation.  Plan:  Continue treatment as planned. Follow up in 2 weeks and assess response.

## 2016-02-02 ENCOUNTER — Ambulatory Visit
Admission: RE | Admit: 2016-02-02 | Discharge: 2016-02-02 | Disposition: A | Discharge status: Home / Self Care | Payer: PPO | Source: Ambulatory Visit | Attending: Radiation Oncology | Admitting: Radiation Oncology

## 2016-02-02 DIAGNOSIS — Z510 Encounter for antineoplastic radiation therapy: Secondary | ICD-10-CM | POA: Diagnosis not present

## 2016-02-03 ENCOUNTER — Ambulatory Visit
Admission: RE | Admit: 2016-02-03 | Discharge: 2016-02-03 | Disposition: A | Discharge status: Home / Self Care | Payer: PPO | Source: Ambulatory Visit | Attending: Radiation Oncology | Admitting: Radiation Oncology

## 2016-02-03 DIAGNOSIS — Z510 Encounter for antineoplastic radiation therapy: Secondary | ICD-10-CM | POA: Diagnosis not present

## 2016-02-04 ENCOUNTER — Ambulatory Visit
Admission: RE | Admit: 2016-02-04 | Discharge: 2016-02-04 | Disposition: A | Discharge status: Home / Self Care | Payer: PPO | Source: Ambulatory Visit | Attending: Radiation Oncology | Admitting: Radiation Oncology

## 2016-02-04 ENCOUNTER — Telehealth: Payer: Self-pay | Admitting: *Deleted

## 2016-02-04 ENCOUNTER — Encounter: Payer: Self-pay | Admitting: Radiation Oncology

## 2016-02-04 DIAGNOSIS — Z510 Encounter for antineoplastic radiation therapy: Secondary | ICD-10-CM | POA: Diagnosis not present

## 2016-02-04 NOTE — Telephone Encounter (Signed)
Spoke with patient to follow up after XRT completion.  She state she is doing well.  Encouraged her to call with any needs or concerns.

## 2016-02-06 DIAGNOSIS — C50112 Malignant neoplasm of central portion of left female breast: Secondary | ICD-10-CM | POA: Insufficient documentation

## 2016-02-06 DIAGNOSIS — Z510 Encounter for antineoplastic radiation therapy: Secondary | ICD-10-CM | POA: Insufficient documentation

## 2016-02-07 ENCOUNTER — Other Ambulatory Visit: Payer: Self-pay | Admitting: Hematology

## 2016-02-10 ENCOUNTER — Telehealth: Payer: Self-pay | Admitting: *Deleted

## 2016-02-10 NOTE — Telephone Encounter (Addendum)
Received call from pt req refill on her tamoxifen.  She reports that she took the anastrozole x 4 days & was nauseated & stopped & restarted tamoxifen.  She reports that she told Dr Pablo Ledger that she was taking the tamoxifen.  She has 2 pills left. Next appt with Dr Burr Medico is May 4.  Informed that we would discuss with Dr Burr Medico & call her back.  Return Ph # is [phone] # Per Dr Burr Medico, pt should stop tamoxifen & discuss at next appt.  Called pt & informed.

## 2016-02-11 ENCOUNTER — Other Ambulatory Visit: Payer: Self-pay | Admitting: Adult Health

## 2016-02-11 DIAGNOSIS — C50112 Malignant neoplasm of central portion of left female breast: Secondary | ICD-10-CM

## 2016-02-14 NOTE — Progress Notes (Signed)
Mrs. is here for a two week  follow up visit for breast cancer of central portion of left breast. Skin status:Left breast with some redness Lotion being used: Lotion with vitamin E Have you seen your medical oncologist? Date If not ,when is appointment 12-24-15,next 03-02-16 Dr. Truitt Merle ER+,have started AI or Tamoxifen? If not, why? Tamoxifen last dose 02-14-16 Discuss survivorship appointment:04-13-16     Gloria [name] Offer referral reading material for Survivorship, [employer] and [facility] given   Anticipated interventions by surgeon:02-04-16 Appetite:Okay Pain:1/10 Left breast pain taking Tylenol Arm movement:Able to raise left arm without discomfort Energy level:Having fatigue starting in the morning and last most of the day.

## 2016-02-15 ENCOUNTER — Ambulatory Visit
Admission: RE | Admit: 2016-02-15 | Discharge: 2016-02-15 | Disposition: A | Discharge status: Home / Self Care | Payer: PPO | Source: Ambulatory Visit | Attending: Radiation Oncology | Admitting: Radiation Oncology

## 2016-02-15 ENCOUNTER — Encounter: Payer: Self-pay | Admitting: Radiation Oncology

## 2016-02-15 VITALS — BP 126/69 | HR 74 | Temp 97.6°F | Resp 16 | Ht 60.0 in | Wt 142.6 lb

## 2016-02-15 DIAGNOSIS — C50112 Malignant neoplasm of central portion of left female breast: Secondary | ICD-10-CM

## 2016-02-15 NOTE — Progress Notes (Signed)
   Department of Radiation Oncology  Phone:  [phone] Fax:        [phone]   Name: Gloria [name] MRN: [id number]  DOB: 08/07/1928  Date: 02/15/2016  Follow Up Visit Note  Diagnosis: Cancer of central portion of left female breast [facility])   Staging form: Breast, AJCC 7th Edition     Pathologic stage from 08/04/2010: Stage IIB (T2, N1a, cM0) - Signed by Truitt Merle, MD on 12/09/2015  Summary and Interval since last radiation: 44 Gy completed 02/01/16  Interval History: Gloria [name] presents today for routine followup.  She is feeling well. She still has some issues with UTI. She has also has some issues with her xanax prescription. She is accompanied by her daughter. She started taking tamoxifen as she did not tolerate the anastrozole well.   Physical Exam:  Filed Vitals:   02/15/16 1413  BP: 126/69  Pulse: 74  Temp: 97.6 F (36.4 C)  TempSrc: Oral  Resp: 16  Height: 5' (1.524 m)  Weight: 142 lb 9.6 oz (64.683 kg)  SpO2: 98%   A few palpable nodules along the scar in the subcutaneous tissues of the breast.   IMPRESSION: Gloria [name] is a 82 y.o. female s/p palliative radiation with residual disease  PLAN:  We discussed option including observation, antiestrogens or further radiation. She would like to proceed with further radiation. We wills tart with the same fields and prescription for 10 more fractions on Monday.     [name], MD

## 2016-02-16 DIAGNOSIS — R300 Dysuria: Secondary | ICD-10-CM | POA: Diagnosis not present

## 2016-02-16 DIAGNOSIS — Z0000 Encounter for general adult medical examination without abnormal findings: Secondary | ICD-10-CM | POA: Diagnosis not present

## 2016-02-17 DIAGNOSIS — C50112 Malignant neoplasm of central portion of left female breast: Secondary | ICD-10-CM | POA: Diagnosis not present

## 2016-02-17 DIAGNOSIS — Z510 Encounter for antineoplastic radiation therapy: Secondary | ICD-10-CM | POA: Diagnosis not present

## 2016-02-21 ENCOUNTER — Ambulatory Visit
Admission: RE | Admit: 2016-02-21 | Discharge: 2016-02-21 | Disposition: A | Discharge status: Home / Self Care | Payer: PPO | Source: Ambulatory Visit | Attending: Radiation Oncology | Admitting: Radiation Oncology

## 2016-02-21 DIAGNOSIS — C50112 Malignant neoplasm of central portion of left female breast: Secondary | ICD-10-CM | POA: Diagnosis not present

## 2016-02-21 DIAGNOSIS — Z510 Encounter for antineoplastic radiation therapy: Secondary | ICD-10-CM | POA: Diagnosis not present

## 2016-02-21 DIAGNOSIS — Z923 Personal history of irradiation: Secondary | ICD-10-CM | POA: Diagnosis not present

## 2016-02-22 ENCOUNTER — Ambulatory Visit
Admission: RE | Admit: 2016-02-22 | Discharge: 2016-02-22 | Disposition: A | Discharge status: Home / Self Care | Payer: PPO | Source: Ambulatory Visit | Attending: Radiation Oncology | Admitting: Radiation Oncology

## 2016-02-22 ENCOUNTER — Encounter: Payer: Self-pay | Admitting: Radiation Oncology

## 2016-02-22 VITALS — BP 115/63 | HR 68 | Temp 97.6°F | Resp 16 | Ht <= 58 in

## 2016-02-22 DIAGNOSIS — L84 Corns and callosities: Secondary | ICD-10-CM | POA: Diagnosis not present

## 2016-02-22 DIAGNOSIS — I10 Essential (primary) hypertension: Secondary | ICD-10-CM | POA: Diagnosis not present

## 2016-02-22 DIAGNOSIS — Z923 Personal history of irradiation: Secondary | ICD-10-CM | POA: Insufficient documentation

## 2016-02-22 DIAGNOSIS — Z1389 Encounter for screening for other disorder: Secondary | ICD-10-CM | POA: Diagnosis not present

## 2016-02-22 DIAGNOSIS — C50312 Malignant neoplasm of lower-inner quadrant of left female breast: Secondary | ICD-10-CM | POA: Diagnosis not present

## 2016-02-22 DIAGNOSIS — C50112 Malignant neoplasm of central portion of left female breast: Secondary | ICD-10-CM

## 2016-02-22 DIAGNOSIS — M810 Age-related osteoporosis without current pathological fracture: Secondary | ICD-10-CM | POA: Diagnosis not present

## 2016-02-22 DIAGNOSIS — Z510 Encounter for antineoplastic radiation therapy: Secondary | ICD-10-CM | POA: Diagnosis not present

## 2016-02-22 DIAGNOSIS — J449 Chronic obstructive pulmonary disease, unspecified: Secondary | ICD-10-CM | POA: Diagnosis not present

## 2016-02-22 DIAGNOSIS — Z0000 Encounter for general adult medical examination without abnormal findings: Secondary | ICD-10-CM | POA: Diagnosis not present

## 2016-02-22 MED ORDER — RADIAPLEXRX EX GEL
Freq: Once | CUTANEOUS | Status: AC
  Administered 2016-02-22: 16:00:00 via TOPICAL

## 2016-02-22 NOTE — Addendum Note (Signed)
Encounter addended by: Malena Edman, RN on: 02/22/2016  4:01 PM<BR>     Documentation filed: Inpatient Patient Education

## 2016-02-22 NOTE — Progress Notes (Signed)
Weekly Management Note Current Dose:  4 Gy  Projected Dose: 20 Gy   Narrative:  The patient presents for routine under treatment assessment.  CBCT/MVCT images/Port film x-rays were reviewed.  The chart was checked. Saw on tx machine. Pt is taking AI and tolerating well.   Physical Findings: Weight:  . Unchanged  Impression:  The patient is tolerating radiation.  Plan:  Continue treatment as planned. Ok to continue AI if she is tolerating well.

## 2016-02-22 NOTE — Addendum Note (Signed)
Encounter addended by: Malena Edman, RN on: 02/22/2016  3:56 PM<BR>     Documentation filed: Inpatient MAR

## 2016-02-22 NOTE — Progress Notes (Addendum)
Gloria [name] has received 2 fractions to her LCW.  Skin to LCW with normal color.  Radiaplex gel given today with instructions. Appetite fair.  Having fatigue in the afternoon. BP 115/63 mmHg  Pulse 68  Temp(Src) 97.6 F (36.4 C) (Oral)  Resp 16  Ht 5" (0.127 m)  SpO2 99%

## 2016-02-22 NOTE — Addendum Note (Signed)
Encounter addended by: Malena Edman, RN on: 02/22/2016  3:54 PM<BR>     Documentation filed: Visit Diagnoses, Dx Association, Orders

## 2016-02-23 ENCOUNTER — Ambulatory Visit
Admission: RE | Admit: 2016-02-23 | Discharge: 2016-02-23 | Disposition: A | Discharge status: Home / Self Care | Payer: PPO | Source: Ambulatory Visit | Attending: Radiation Oncology | Admitting: Radiation Oncology

## 2016-02-23 DIAGNOSIS — Z510 Encounter for antineoplastic radiation therapy: Secondary | ICD-10-CM | POA: Diagnosis not present

## 2016-02-24 ENCOUNTER — Ambulatory Visit
Admission: RE | Admit: 2016-02-24 | Discharge: 2016-02-24 | Disposition: A | Discharge status: Home / Self Care | Payer: PPO | Source: Ambulatory Visit | Attending: Radiation Oncology | Admitting: Radiation Oncology

## 2016-02-24 DIAGNOSIS — Z510 Encounter for antineoplastic radiation therapy: Secondary | ICD-10-CM | POA: Diagnosis not present

## 2016-02-25 ENCOUNTER — Ambulatory Visit
Admission: RE | Admit: 2016-02-25 | Discharge: 2016-02-25 | Disposition: A | Discharge status: Home / Self Care | Payer: PPO | Source: Ambulatory Visit | Attending: Radiation Oncology | Admitting: Radiation Oncology

## 2016-02-25 ENCOUNTER — Other Ambulatory Visit: Payer: Self-pay | Admitting: *Deleted

## 2016-02-25 DIAGNOSIS — Z510 Encounter for antineoplastic radiation therapy: Secondary | ICD-10-CM | POA: Diagnosis not present

## 2016-02-25 DIAGNOSIS — C50112 Malignant neoplasm of central portion of left female breast: Secondary | ICD-10-CM | POA: Diagnosis not present

## 2016-02-25 MED ORDER — NITROGLYCERIN 0.4 MG SL SUBL: 0.4000 mg | SUBLINGUAL_TABLET | SUBLINGUAL | Status: DC | PRN

## 2016-02-28 ENCOUNTER — Ambulatory Visit
Admission: RE | Admit: 2016-02-28 | Discharge: 2016-02-28 | Disposition: A | Discharge status: Home / Self Care | Payer: PPO | Source: Ambulatory Visit | Attending: Radiation Oncology | Admitting: Radiation Oncology

## 2016-02-28 DIAGNOSIS — Z510 Encounter for antineoplastic radiation therapy: Secondary | ICD-10-CM | POA: Diagnosis not present

## 2016-02-29 ENCOUNTER — Ambulatory Visit
Admission: RE | Admit: 2016-02-29 | Discharge: 2016-02-29 | Disposition: A | Discharge status: Home / Self Care | Payer: PPO | Source: Ambulatory Visit | Attending: Radiation Oncology | Admitting: Radiation Oncology

## 2016-02-29 DIAGNOSIS — Z510 Encounter for antineoplastic radiation therapy: Secondary | ICD-10-CM | POA: Diagnosis not present

## 2016-02-29 DIAGNOSIS — Z923 Personal history of irradiation: Secondary | ICD-10-CM | POA: Insufficient documentation

## 2016-02-29 DIAGNOSIS — C50112 Malignant neoplasm of central portion of left female breast: Secondary | ICD-10-CM | POA: Insufficient documentation

## 2016-02-29 NOTE — Progress Notes (Signed)
Name: Gloria [name]   MRN: [id number]  Date:  12/30/2015   DOB: 26-Feb-1928  Status:outpatient    DIAGNOSIS: Cancer of central portion of left female breast [facility])   Staging form: Breast, AJCC 7th Edition     Pathologic stage from 08/04/2010: Stage IIB (T2, N1a, cM0) - Signed by Truitt Merle, MD on 12/09/2015   CONSENT VERIFIED: yes   SET UP: Patient is setup supine   IMMOBILIZATION:  The following immobilization was used:Custom Moldable Pillow, breast board.   NARRATIVE: Gloria [name] underwent complex simulation and treatment planning for her  treatment today.  The treatment area was delineated with wires on the sim table with her arms up.  Plenty of margin from gross disease and her scar was taken. CT scan was taken from the base of skull through the diaphragm.. The depth of her chest wall was measured via this scan.   6 and 9  MeV electrons will be prescribed to the 100%  isodose line.   I personally oversaw and approved the construction of a unique block which will be used for beam modification purposes.  An isodose plan is requested. Special port plan is requested.

## 2016-02-29 NOTE — Progress Notes (Signed)
Weekly Management Note Current Dose:  14 Gy  Projected Dose: 20 Gy   Narrative:  The patient presents for routine under treatment assessment.  CBCT/MVCT images/Port film x-rays were reviewed.  The chart was checked. Reports pain in her heart for a few minutes for which she took medication. Notes chest pain intermittently while moving around. She is not absolutely sure if it is while she is moving around. She is not sure if she is short of breath when the chest pain happens. She has an appointment scheduled with her PCP on 03/09/16, noting she no longer has "those things that go under the tongue". She did not take her antiestrogen on Friday, Saturday, or Sunday, due to this pain.   Physical Findings: Weight:  . Dry desquamation and redness around her scar.   Impression:  The patient is tolerating radiation.  Plan:  Continue treatment as planned.  I will see her again in 1 month for follow up. In the interim, she will see her PCP on 5/11 and follow up with [name] in medical oncology on 5/4.  ------------------------------------------------  [name], MD  This document serves as a record of services personally performed by [name], MD. It was created on her behalf by Arlyce Harman, a trained medical scribe. The creation of this record is based on the scribe's personal observations and the provider's statements to them. This document has been checked and approved by the attending provider.

## 2016-03-01 ENCOUNTER — Ambulatory Visit
Admission: RE | Admit: 2016-03-01 | Discharge: 2016-03-01 | Disposition: A | Discharge status: Home / Self Care | Payer: PPO | Source: Ambulatory Visit | Attending: Radiation Oncology | Admitting: Radiation Oncology

## 2016-03-01 DIAGNOSIS — Z510 Encounter for antineoplastic radiation therapy: Secondary | ICD-10-CM | POA: Diagnosis not present

## 2016-03-02 ENCOUNTER — Encounter: Payer: Self-pay | Admitting: Hematology

## 2016-03-02 ENCOUNTER — Telehealth: Payer: Self-pay | Admitting: Hematology

## 2016-03-02 ENCOUNTER — Ambulatory Visit (HOSPITAL_BASED_OUTPATIENT_CLINIC_OR_DEPARTMENT_OTHER): Payer: PPO | Admitting: Hematology

## 2016-03-02 ENCOUNTER — Ambulatory Visit
Admission: RE | Admit: 2016-03-02 | Discharge: 2016-03-02 | Disposition: A | Discharge status: Home / Self Care | Payer: PPO | Source: Ambulatory Visit | Attending: Radiation Oncology | Admitting: Radiation Oncology

## 2016-03-02 ENCOUNTER — Other Ambulatory Visit (HOSPITAL_BASED_OUTPATIENT_CLINIC_OR_DEPARTMENT_OTHER): Payer: PPO

## 2016-03-02 VITALS — BP 127/79 | HR 81 | Temp 97.6°F | Resp 16 | Ht 60.0 in | Wt 123.7 lb

## 2016-03-02 DIAGNOSIS — C44501 Unspecified malignant neoplasm of skin of breast: Secondary | ICD-10-CM | POA: Diagnosis not present

## 2016-03-02 DIAGNOSIS — G4762 Sleep related leg cramps: Secondary | ICD-10-CM

## 2016-03-02 DIAGNOSIS — C50112 Malignant neoplasm of central portion of left female breast: Secondary | ICD-10-CM

## 2016-03-02 DIAGNOSIS — Z510 Encounter for antineoplastic radiation therapy: Secondary | ICD-10-CM | POA: Diagnosis not present

## 2016-03-02 DIAGNOSIS — C50912 Malignant neoplasm of unspecified site of left female breast: Secondary | ICD-10-CM

## 2016-03-02 DIAGNOSIS — I10 Essential (primary) hypertension: Secondary | ICD-10-CM | POA: Diagnosis not present

## 2016-03-02 LAB — CBC WITH DIFFERENTIAL/PLATELET
BASO%: 1.2 % (ref 0.0–2.0)
Basophils Absolute: 0.1 10*3/uL (ref 0.0–0.1)
EOS ABS: 0.3 10*3/uL (ref 0.0–0.5)
EOS%: 6.3 % (ref 0.0–7.0)
HCT: 39.9 % (ref 34.8–46.6)
HGB: 13 g/dL (ref 11.6–15.9)
LYMPH%: 7.9 % — AB (ref 14.0–49.7)
MCH: 30.6 pg (ref 25.1–34.0)
MCHC: 32.7 g/dL (ref 31.5–36.0)
MCV: 93.8 fL (ref 79.5–101.0)
MONO#: 0.4 10*3/uL (ref 0.1–0.9)
MONO%: 9.6 % (ref 0.0–14.0)
NEUT#: 3.4 10*3/uL (ref 1.5–6.5)
NEUT%: 75 % (ref 38.4–76.8)
PLATELETS: 128 10*3/uL — AB (ref 145–400)
RBC: 4.25 10*6/uL (ref 3.70–5.45)
RDW: 13.3 % (ref 11.2–14.5)
WBC: 4.5 10*3/uL (ref 3.9–10.3)
lymph#: 0.4 10*3/uL — ABNORMAL LOW (ref 0.9–3.3)

## 2016-03-02 LAB — COMPREHENSIVE METABOLIC PANEL
ALT: 10 U/L (ref 0–55)
ANION GAP: 8 meq/L (ref 3–11)
AST: 20 U/L (ref 5–34)
Albumin: 3.5 g/dL (ref 3.5–5.0)
Alkaline Phosphatase: 58 U/L (ref 40–150)
BILIRUBIN TOTAL: 0.45 mg/dL (ref 0.20–1.20)
BUN: 19.1 mg/dL (ref 7.0–26.0)
CHLORIDE: 105 meq/L (ref 98–109)
CO2: 27 meq/L (ref 22–29)
Calcium: 9 mg/dL (ref 8.4–10.4)
Creatinine: 1 mg/dL (ref 0.6–1.1)
EGFR: 52 mL/min/{1.73_m2} — AB (ref >90)
Glucose: 63 mg/dl — ABNORMAL LOW (ref 70–140)
Potassium: 4.3 mEq/L (ref 3.5–5.1)
Sodium: 140 mEq/L (ref 136–145)
TOTAL PROTEIN: 6.2 g/dL — AB (ref 6.4–8.3)

## 2016-03-02 NOTE — Progress Notes (Signed)
[place]  Telephone:([phone]) [phone] Fax:(336) [phone]  OFFICE PROGRESS NOTE  PATIENT: Gloria [name]   DOB: Jun 07, 1928  MR#: [id number]  HMC#:[id number]  MO:[id number],KPT [name], MD [name], MD [name]. [name], MD  DIAGNOSIS:  An 82 year old [place] woman with invasive lobular carcinoma of the left breast diagnosed in 07/2010, chest wall local recurrence in 09/2015 .  Oncology History   Cancer of central portion of left female breast [facility])   Staging form: Breast, AJCC 7th Edition     Pathologic stage from 08/04/2010: Stage IIB (T2, N1a, cM0) - Signed by Truitt Merle, MD on 12/09/2015       Cancer of central portion of left female breast ([place])   08/03/2010 Mammogram  left nipple retraction, and the palpable mass subareolar left breast at the 6:00 position. Ultrasound confirmed the presence of a mass measuring 2.6 x 1.7 x 2.4 cm   08/04/2010 Initial Diagnosis Cancer of central portion of left female breast ([place])   08/04/2010 Initial Biopsy left breast mass biopsy showed an invasive mammary carcinoma with lobular features.   08/04/2010 Receptors her2 ER 95%, PR 97%, Ki-67 17%, HER-2/neu (-)   08/11/2010 Imaging left breast mass 4.6 x 4.2 x 2.3 cm with enhancement distortion extending to the nipple retraction. There is questionable anterior mediastinal lymph node seen   07/2010 - 09/03/2010 Anti-estrogen oral therapy neoadjuvant letrozole    09/03/2010 Surgery  left breast mastectomy with sentinel node biopsy on 09/03/2010.   08/2010 - 01/2015 Anti-estrogen oral therapy Tamoxifen, pt stopped on her own    12/03/2010 - 01/23/2011 Radiation Therapy left chest wall and axilla radiaiton    10/19/2015 Progression she developed left chest wall recurrence, s/p resection on 11/25/2015 wtih positive margins    01/03/2016 -  Radiation Therapy radiation to left chest wall recurrence, pt progressed through the radiation, and had additional 10 fractions of radiation      CURRENT THERAPY:   Left chest wall Radiation  INTERVAL HISTORY:  Gloria [name] returns for follow up. She is accompanied by her friend to clinic today. Her chest wall radiation has been extended for additional 10 sessions due to the disease progression of her local recurrence. She has been tolerating radiation moderately well, complains of mild fatigue, skin irritation and mild discomfort. She had an episode of severe chest pain and night 3 days ago, resolved spontaneously after several minutes. She was concerned it could be her heart issue. She has appointment to see a cardiologist on May 11. She also reports leg cramps, especially at night. She takes calcium and magnesium. No other new complaints.  PAST MEDICAL HISTORY: Past Medical History  Diagnosis Date  . Cancer [facility])     breast- left  . GERD (gastroesophageal reflux disease)   . Hypertension   . Anxiety   . Depression   . Blood in urine   . Knee fracture   . CAD (coronary artery disease)     non obstructive by cath  . TR (tricuspid regurgitation)     mild by Echo 12/2008 EF >55%    PAST SURGICAL HISTORY: Past Surgical History  Procedure Laterality Date  . Tonsillectomy    . Breast surgery  08-30-10    mastectomy  . Colon surgery    . Abdominal hysterectomy    . Cardiac catheterization  08/2011    20% LAD stenosis, 20% diagonal stenosis, 10-20% proximal dominant RCA stenosis  . Left heart catheterization with coronary angiogram N/A 09/08/2011    Procedure:  LEFT HEART CATHETERIZATION WITH CORONARY ANGIOGRAM;  Surgeon: Troy Sine, MD;  Location: [facility] CATH LAB;  Service: Cardiovascular;  Laterality: N/A;  . Abdominal aortagram N/A 09/08/2011    Procedure: ABDOMINAL Maxcine Ham;  Surgeon: Troy Sine, MD;  Location: [facility] CATH LAB;  Service: Cardiovascular;  Laterality: N/A;  . Minor breast biopsy Left 11/25/2015    Procedure: MINOR EXCISION MASS LEFT CHEST WALL;  Surgeon: Autumn Messing III, MD;  Location: [place];  Service: General;  Laterality:  Left;    FAMILY HISTORY: Family History  Problem Relation Age of Onset  . Cancer Father     prostate  . Cancer Sister     breast  . Heart disease Sister   . Cancer Brother     pancreatic  . Cancer Maternal Grandmother     colon  . Dementia Mother   . Sudden death Brother   . Heart attack Brother   . Heart attack Brother   . Arthritis Sister     SOCIAL HISTORY: Social History  Substance Use Topics  . Smoking status: Former Smoker    Quit date: 11/15/1991  . Smokeless tobacco: Never Used  . Alcohol Use: 1.2 oz/week    2 Glasses of wine per week     Comment: 1 glass per week    ALLERGIES: Allergies  Allergen Reactions  . Contrast Media [Iodinated Diagnostic Agents] Anaphylaxis  . Adhesive [Tape] Other (See Comments)    blisters  . Cephalexin Swelling  . Ciprofloxacin Swelling  . Demerol Nausea And Vomiting  . Penicillins Swelling    REACTION: allergic to penicillin  . Sulfamethoxazole-Trimethoprim Itching and Swelling    REACTION: swelling/hives  . Quinolones Itching and Rash    REACTION: itching, rash Pt. Reports no problems with levaquin     MEDICATIONS:  Current Outpatient Prescriptions  Medication Sig Dispense Refill  . ALPRAZolam (XANAX) 0.25 MG tablet Take 1/2 tablet prior to radiation and 1 tablet prior to bedtime. 30 tablet 1  . anastrozole (ARIMIDEX) 1 MG tablet Take 1 tablet (1 mg total) by mouth daily. 30 tablet 3  . aspirin 81 MG chewable tablet Chew 81 mg by mouth daily. Reported on 12/24/2015    . Calcium Carbonate-Vitamin D (CALTRATE 600+D PO) Take 1 tablet by mouth daily.     . cetirizine (ZYRTEC) 10 MG tablet Take 10 mg by mouth as needed.     . doxepin (SINEQUAN) 10 MG capsule Take 10 mg by mouth at bedtime.     . fluticasone (FLONASE) 50 MCG/ACT nasal spray Place 1 spray into both nostrils daily as needed.     Marland Kitchen guaiFENesin (MUCINEX) 600 MG 12 hr tablet Reported on 02/22/2016    . losartan (COZAAR) 25 MG tablet Take 50 mg by mouth daily.  0   . MAGNESIUM PO Take 250 mg by mouth daily. Reported on 02/15/2016    . nitroGLYCERIN (NITROSTAT) 0.4 MG SL tablet Place 1 tablet (0.4 mg total) under the tongue every 5 (five) minutes as needed for chest pain. <PLEASE MAKE APPOINTMENT> 25 tablet 3   No current facility-administered medications for this visit.      REVIEW OF SYSTEMS: A 10 point review of systems was completed and is negative except as noted above.   Health Maintenance  Mammogram: 10/19/2015   PHYSICAL EXAMINATION: BP 127/79 mmHg  Pulse 81  Temp(Src) 97.6 F (36.4 C) (Oral)  Resp 16  Ht 5' (1.524 m)  Wt 123 lb 11.2 oz (56.11 kg)  BMI 24.16 kg/m2  SpO2 98%  GENERAL: Patient is a well appearing female in no acute distress HEENT:  Sclerae anicteric.  Oropharynx clear and moist. No ulcerations or evidence of oropharyngeal candidiasis. Neck is supple.  NODES:   There is no cervical , supraclavicular, or axillary lymphadenopathy palpated.  BREAST EXAM:  Left breast is surgically absent,  incision in the left frontal chest has healed well. (+) Diffuse skin erythema in the middle of left frontal chest, secondary to radiation.  There are 5-6 small nodules  along the middle of the incision line, nontender, suspicious for local recurrence. Right breast and axillas no masses or nodules.   LUNGS:  Clear to auscultation bilaterally.  No wheezes or rhonchi. HEART:  Regular rate and rhythm. No murmur appreciated. ABDOMEN:  Soft, nontender.  Positive, normoactive bowel sounds. No organomegaly palpated. MSK:  No focal spinal tenderness to palpation. Full range of motion bilaterally in the upper extremities. EXTREMITIES:  No peripheral edema.   SKIN:  Clear with no obvious rashes or skin changes. No nail dyscrasia. NEURO:  Nonfocal. Well oriented.  Appropriate affect.  ECOG FS:  Grade 1 - Symptomatic but completely ambulatory   LAB RESULTS: CBC Latest Ref Rng 03/02/2016 12/09/2015 09/02/2015  WBC 3.9 - 10.3 10e3/uL 4.5 5.6 4.0   Hemoglobin 11.6 - 15.9 g/dL 13.0 13.8 13.5  Hematocrit 34.8 - 46.6 % 39.9 42.2 40.9  Platelets 145 - 400 10e3/uL 128(L) 158 137(L)      CMP Latest Ref Rng 03/02/2016 12/09/2015 09/02/2015  Glucose 70 - 140 mg/dl 63(L) 89 89  BUN 7.0 - 26.0 mg/dL 19.1 16.0 17.0  Creatinine 0.6 - 1.1 mg/dL 1.0 1.0 1.1  Sodium 136 - 145 mEq/L 140 142 143  Potassium 3.5 - 5.1 mEq/L 4.3 4.3 4.5  CO2 22 - 29 mEq/L _0 Calcium 8.4 - 10.4 mg/dL 9.0 9.5 9.7  Total Protein 6.4 - 8.3 g/dL 6.2(L) 6.8 6.3(L)  Total Bilirubin 0.20 - 1.20 mg/dL 0.45 0.75 0.56  Alkaline Phos 40 - 150 U/L 58 86 85  AST 5 - 34 U/L _1 ALT 0 - 55 U/L _2 PATHOLOGY REPORT Diagnosis 11/25/2015 Soft tissue mass, simple excision, Left chest wall - INVASIVE LOBULAR CARCINOMA, PRESENT AT NON ORIENTED TISSUE EDGE(S). - SEE COMMENT. Microscopic Comment The carcinoma appears grade 2. A breast prognostic profile will be performed and the results reported separately. (JDP:gt, 11/26/15).  Results: IMMUNOHISTOCHEMICAL AND MORPHOMETRIC ANALYSIS PERFORMED MANUALLY Estrogen Receptor: 95%, POSITIVE, STRONG STAINING INTENSITY Progesterone Receptor: 60%, POSITIVE, STRONG STAINING INTENSITY Results: HER2 - NEGATIVE RATIO OF HER2/CEP17 SIGNALS 1.07 AVERAGE HER2 COPY NUMBER PER CELL 2.90   RADIOGRAPHIC STUDIES: CT chest, abdomen and pelvis wo contrast 12/23/2015 IMPRESSION: 8 mm soft tissue nodule in the left chest wall mastectomy bed, likely representing the site of known left breast cancer recurrence.  No evidence of metastatic disease within the chest, abdomen, or Pelvis.  ADDENDUM: This case was subsequently discussed with [name] by telephone. The recurrent breast carcinoma in the left chest wall mastectomy bed has already been surgically resected. Therefore the small nodular density described in the impression below most likely represents postop change. Recommend continued attention on follow-up imaging, either  with CT or breast ultrasound.  Bone scan 12/23/2015 IMPRESSION: No evidence of metastatic disease. Findings suggesting degenerative change about the right sternoclavicular joint, the lower lumbar sacral region, and both knees. Specifically no focal rib lesion identified.   ASSESSMENT/PLAN: 82 y.o. with:  1. Stage IIB, T2 N1 invasive lobular carcinoma, grade 2, ER 95%, PR 97%, Ki-67 17%, HER-2/neu no amplification, diagnosed in 07/2010, local chest wall recurrence in 09/2015   -She was on adjuvant tamoxifen for 4-1/2 years, stopped on her own. -She unfortunately developed local recurrence 8 months after she stopped tamoxifen. -I reviewed her surgical pathology findings, which is consistent with lobular carcinoma recurrence from her previous breast cancer. -The tumor was surgically removed, with positive margins. -Her restaging CT scan and bone scan in 12/2015 was negative for ulcer distant metastasis. -She is finishing chest wall radiation in a few days, the radiation course was extended due to the disease progression -If the chest wall nodules do not resolve in 2 months, I'll recommend her to get a biopsy, and add Ibrance if biopsy confirms persistence disease. -I strongly encouraged her to continue anastrozole.   2. Leg cramps -I encouraged her to continue calcium, magnesium, and keep herself well hydrated.  3. HTN, CAD  -She'll continue follow-up with her primary care physician, she also has appointment with her cardiologist on May 11  Plan -Continue anastrozole -she will see Dr. Pablo Ledger on 6/15 -I will see her back in mid-July with lab   I spent 25 minutes counseling the patient face to face.  The total time spent in the appointment was 30 minutes.  Truitt Merle  03/02/2016

## 2016-03-02 NOTE — Telephone Encounter (Signed)
Gave pt appt & avs °

## 2016-03-03 ENCOUNTER — Ambulatory Visit
Admission: RE | Admit: 2016-03-03 | Discharge: 2016-03-03 | Disposition: A | Discharge status: Home / Self Care | Payer: PPO | Source: Ambulatory Visit | Attending: Radiation Oncology | Admitting: Radiation Oncology

## 2016-03-03 ENCOUNTER — Encounter: Payer: Self-pay | Admitting: Radiation Oncology

## 2016-03-03 DIAGNOSIS — Z510 Encounter for antineoplastic radiation therapy: Secondary | ICD-10-CM | POA: Diagnosis not present

## 2016-03-03 DIAGNOSIS — C50112 Malignant neoplasm of central portion of left female breast: Secondary | ICD-10-CM | POA: Diagnosis not present

## 2016-03-09 ENCOUNTER — Encounter: Payer: Self-pay | Admitting: Cardiovascular Disease

## 2016-03-09 ENCOUNTER — Telehealth: Payer: Self-pay | Admitting: *Deleted

## 2016-03-09 ENCOUNTER — Emergency Department (HOSPITAL_COMMUNITY)
Admission: EM | Admit: 2016-03-09 | Discharge: 2016-03-10 | Disposition: A | Discharge status: Home / Self Care | Payer: PPO | Attending: Emergency Medicine | Admitting: Emergency Medicine

## 2016-03-09 ENCOUNTER — Encounter (HOSPITAL_COMMUNITY): Payer: Self-pay

## 2016-03-09 ENCOUNTER — Ambulatory Visit (INDEPENDENT_AMBULATORY_CARE_PROVIDER_SITE_OTHER): Payer: PPO | Admitting: Cardiovascular Disease

## 2016-03-09 VITALS — BP 111/75 | Ht 60.0 in | Wt 122.6 lb

## 2016-03-09 DIAGNOSIS — Z880 Allergy status to penicillin: Secondary | ICD-10-CM | POA: Insufficient documentation

## 2016-03-09 DIAGNOSIS — Z8719 Personal history of other diseases of the digestive system: Secondary | ICD-10-CM | POA: Insufficient documentation

## 2016-03-09 DIAGNOSIS — F329 Major depressive disorder, single episode, unspecified: Secondary | ICD-10-CM | POA: Insufficient documentation

## 2016-03-09 DIAGNOSIS — Z853 Personal history of malignant neoplasm of breast: Secondary | ICD-10-CM | POA: Diagnosis not present

## 2016-03-09 DIAGNOSIS — L03313 Cellulitis of chest wall: Secondary | ICD-10-CM | POA: Diagnosis not present

## 2016-03-09 DIAGNOSIS — C50112 Malignant neoplasm of central portion of left female breast: Secondary | ICD-10-CM

## 2016-03-09 DIAGNOSIS — I2510 Atherosclerotic heart disease of native coronary artery without angina pectoris: Secondary | ICD-10-CM | POA: Diagnosis not present

## 2016-03-09 DIAGNOSIS — N289 Disorder of kidney and ureter, unspecified: Secondary | ICD-10-CM | POA: Insufficient documentation

## 2016-03-09 DIAGNOSIS — Z9889 Other specified postprocedural states: Secondary | ICD-10-CM | POA: Diagnosis not present

## 2016-03-09 DIAGNOSIS — F419 Anxiety disorder, unspecified: Secondary | ICD-10-CM | POA: Diagnosis not present

## 2016-03-09 DIAGNOSIS — I25810 Atherosclerosis of coronary artery bypass graft(s) without angina pectoris: Secondary | ICD-10-CM | POA: Diagnosis not present

## 2016-03-09 DIAGNOSIS — R0782 Intercostal pain: Secondary | ICD-10-CM

## 2016-03-09 DIAGNOSIS — Z79899 Other long term (current) drug therapy: Secondary | ICD-10-CM | POA: Insufficient documentation

## 2016-03-09 DIAGNOSIS — Z7982 Long term (current) use of aspirin: Secondary | ICD-10-CM | POA: Insufficient documentation

## 2016-03-09 DIAGNOSIS — R079 Chest pain, unspecified: Secondary | ICD-10-CM | POA: Diagnosis not present

## 2016-03-09 DIAGNOSIS — Z87891 Personal history of nicotine dependence: Secondary | ICD-10-CM | POA: Insufficient documentation

## 2016-03-09 DIAGNOSIS — Z8781 Personal history of (healed) traumatic fracture: Secondary | ICD-10-CM | POA: Insufficient documentation

## 2016-03-09 DIAGNOSIS — I10 Essential (primary) hypertension: Secondary | ICD-10-CM

## 2016-03-09 LAB — CBC WITH DIFFERENTIAL/PLATELET
BASOS PCT: 1 %
Basophils Absolute: 0 10*3/uL (ref 0.0–0.1)
EOS ABS: 0.3 10*3/uL (ref 0.0–0.7)
EOS PCT: 7 %
HCT: 35.7 % — ABNORMAL LOW (ref 36.0–46.0)
Hemoglobin: 11.8 g/dL — ABNORMAL LOW (ref 12.0–15.0)
LYMPHS ABS: 0.5 10*3/uL — AB (ref 0.7–4.0)
Lymphocytes Relative: 10 %
MCH: 31.3 pg (ref 26.0–34.0)
MCHC: 33.1 g/dL (ref 30.0–36.0)
MCV: 94.7 fL (ref 78.0–100.0)
MONOS PCT: 14 %
Monocytes Absolute: 0.7 10*3/uL (ref 0.1–1.0)
Neutro Abs: 3.6 10*3/uL (ref 1.7–7.7)
Neutrophils Relative %: 68 %
PLATELETS: 136 10*3/uL — AB (ref 150–400)
RBC: 3.77 MIL/uL — ABNORMAL LOW (ref 3.87–5.11)
RDW: 13.7 % (ref 11.5–15.5)
WBC: 5.2 10*3/uL (ref 4.0–10.5)

## 2016-03-09 LAB — BASIC METABOLIC PANEL
Anion gap: 8 (ref 5–15)
BUN: 32 mg/dL — AB (ref 6–20)
CO2: 24 mmol/L (ref 22–32)
Calcium: 9.1 mg/dL (ref 8.9–10.3)
Chloride: 110 mmol/L (ref 101–111)
Creatinine, Ser: 1.07 mg/dL — ABNORMAL HIGH (ref 0.44–1.00)
GFR calc Af Amer: 53 mL/min — ABNORMAL LOW (ref >60)
GFR calc non Af Amer: 46 mL/min — ABNORMAL LOW (ref >60)
Glucose, Bld: 111 mg/dL — ABNORMAL HIGH (ref 65–99)
Potassium: 4.5 mmol/L (ref 3.5–5.1)
SODIUM: 142 mmol/L (ref 135–145)

## 2016-03-09 MED ORDER — CLINDAMYCIN HCL 300 MG PO CAPS
300.0000 mg | ORAL_CAPSULE | Freq: Once | ORAL | Status: AC
  Administered 2016-03-09: 300 mg via ORAL
  Filled 2016-03-09: qty 1

## 2016-03-09 NOTE — ED Provider Notes (Signed)
CSN: [id number]     Arrival date & time 03/09/16  2208 History  By signing my name below, I, Altamease Oiler, attest that this documentation has been prepared under the direction and in the presence of Delora Fuel, MD. Electronically Signed: Altamease Oiler, ED Scribe. 03/10/2016. 12:03 AM   Chief Complaint  Patient presents with  . Breast Pain   The history is provided by the patient. No language interpreter was used.   Gloria [name] is a 82 y.o. female with PMHx of breast cancer s/p mastectomy on radiation therapy who presents to the Emergency Department complaining of redness, pain, and drainage from the left side of the chest with onset 3 days ago. 1 week ago the pt received the last dose of radiation that she is eligible for and has noted drainage from the area for the last 3 days. She is concerned because stickers were left on her body at her last doctors appointment despite her adhesive allergy. Extra Strength Tylenol has provided pain relief at home. Pt denies fever and chills. She has a scheduled appointment with Dr. Pablo Ledger, her radiation oncologist, tomorrow.   Past Medical History  Diagnosis Date  . Cancer [facility])     breast- left  . GERD (gastroesophageal reflux disease)   . Hypertension   . Anxiety   . Depression   . Blood in urine   . Knee fracture   . CAD (coronary artery disease)     non obstructive by cath  . TR (tricuspid regurgitation)     mild by Echo 12/2008 EF >55%   Past Surgical History  Procedure Laterality Date  . Tonsillectomy    . Breast surgery  08-30-10    mastectomy  . Colon surgery    . Abdominal hysterectomy    . Cardiac catheterization  08/2011    20% LAD stenosis, 20% diagonal stenosis, 10-20% proximal dominant RCA stenosis  . Left heart catheterization with coronary angiogram N/A 09/08/2011    Procedure: LEFT HEART CATHETERIZATION WITH CORONARY ANGIOGRAM;  Surgeon: Troy Sine, MD;  Location: [facility] CATH LAB;  Service: Cardiovascular;   Laterality: N/A;  . Abdominal aortagram N/A 09/08/2011    Procedure: ABDOMINAL Maxcine Ham;  Surgeon: Troy Sine, MD;  Location: [facility] CATH LAB;  Service: Cardiovascular;  Laterality: N/A;  . Minor breast biopsy Left 11/25/2015    Procedure: MINOR EXCISION MASS LEFT CHEST WALL;  Surgeon: Autumn Messing III, MD;  Location: [place];  Service: General;  Laterality: Left;   Family History  Problem Relation Age of Onset  . Cancer Father     prostate  . Cancer Sister     breast  . Heart disease Sister   . Cancer Brother     pancreatic  . Cancer Maternal Grandmother     colon  . Dementia Mother   . Sudden death Brother   . Heart attack Brother   . Heart attack Brother   . Arthritis Sister    Social History  Substance Use Topics  . Smoking status: Former Smoker    Quit date: 11/15/1991  . Smokeless tobacco: Never Used  . Alcohol Use: 1.2 oz/week    2 Glasses of wine per week     Comment: 1 glass per week   OB History    No data available     Review of Systems  Skin: Positive for color change.       Redness, pain, and drainage at the left chest  All other systems  reviewed and are negative.   Allergies  Contrast media; Penicillins; Adhesive; Cephalexin; Ciprofloxacin; Demerol; Sulfamethoxazole-trimethoprim; and Quinolones  Home Medications   Prior to Admission medications   Medication Sig Start Date End Date Taking? Authorizing Provider  acetaminophen (TYLENOL) 500 MG tablet Take 1,000 mg by mouth daily as needed for moderate pain.   Yes Historical Provider, MD  ALPRAZolam Duanne Moron) 0.25 MG tablet Take 1/2 tablet prior to radiation and 1 tablet prior to bedtime. Patient taking differently: Take 1/2 tablet every morning and 1 tablet prior to bedtime. 01/25/16  Yes [name], MD  anastrozole (ARIMIDEX) 1 MG tablet Take 1 tablet (1 mg total) by mouth daily. 12/09/15  Yes Truitt Merle, MD  aspirin 81 MG chewable tablet Chew 81 mg by mouth daily. Reported on 12/24/2015    Yes Historical Provider, MD  CALCIUM-MAGNESIUM-VITAMIN D PO Take 1 tablet by mouth 2 (two) times daily.   Yes Historical Provider, MD  cetirizine (ZYRTEC) 10 MG tablet Take 10 mg by mouth as needed for allergies.    Yes Historical Provider, MD  doxepin (SINEQUAN) 10 MG capsule Take 10 mg by mouth at bedtime.    Yes Historical Provider, MD  fluticasone (FLONASE) 50 MCG/ACT nasal spray Place 1 spray into both nostrils daily as needed for allergies.    Yes Historical Provider, MD  guaiFENesin (MUCINEX) 600 MG 12 hr tablet Take 600 mg by mouth daily as needed for cough or to loosen phlegm. Reported on 02/22/2016   Yes Historical Provider, MD  losartan (COZAAR) 25 MG tablet Take 25 mg by mouth daily.  02/05/15  Yes Historical Provider, MD  nitroGLYCERIN (NITROSTAT) 0.4 MG SL tablet Place 1 tablet (0.4 mg total) under the tongue every 5 (five) minutes as needed for chest pain. <PLEASE MAKE APPOINTMENT> 02/25/16  Yes Troy Sine, MD  potassium chloride (K-DUR) 10 MEQ tablet Take 10 mEq by mouth daily.   Yes Historical Provider, MD   BP 122/81 mmHg  Pulse 89  Temp(Src) 98.2 F (36.8 C) (Oral)  Resp 18  Ht 5\' 1"  (1.549 m)  Wt 122 lb (55.339 kg)  BMI 23.06 kg/m2  SpO2 94% Physical Exam  Constitutional: She is oriented to person, place, and time. She appears well-developed and well-nourished. No distress.  HENT:  Head: Normocephalic and atraumatic.  Eyes: EOM are normal. Pupils are equal, round, and reactive to light.  Neck: Normal range of motion. Neck supple. No JVD present.  Cardiovascular: Normal rate, regular rhythm and normal heart sounds.   No murmur heard. Pulmonary/Chest: Effort normal and breath sounds normal. She has no wheezes. She has no rales.  S/p left mastectomy. Generalized erythema across the left chest wall with moderate tenderness. Several small areas of skin breakdown with serous drainage. No axillary adenopathy.   Abdominal: Soft. Bowel sounds are normal. She exhibits no  distension and no mass. There is no tenderness.  Musculoskeletal: Normal range of motion. She exhibits no edema.  Lymphadenopathy:    She has no cervical adenopathy.  Neurological: She is alert and oriented to person, place, and time. No cranial nerve deficit. She exhibits normal muscle tone. Coordination normal.  Skin: Skin is warm and dry. No rash noted.  Psychiatric: She has a normal mood and affect. Her behavior is normal. Judgment and thought content normal.  Nursing note and vitals reviewed.   ED Course  Procedures (including critical care time) DIAGNOSTIC STUDIES: Oxygen Saturation is 94% on RA, adequate by my interpretation.    COORDINATION OF CARE:  11:10 PM Discussed treatment plan which includes lab work and abx with pt at bedside and pt agreed to plan.  12:03 AM I re-evaluated the patient and provided an update on the results of her lab work.   Labs Review Results for orders placed or performed during the hospital encounter of 03/09/16  CBC with Differential  Result Value Ref Range   WBC 5.2 4.0 - 10.5 K/uL   RBC 3.77 (L) 3.87 - 5.11 MIL/uL   Hemoglobin 11.8 (L) 12.0 - 15.0 g/dL   HCT 35.7 (L) 36.0 - 46.0 %   MCV 94.7 78.0 - 100.0 fL   MCH 31.3 26.0 - 34.0 pg   MCHC 33.1 30.0 - 36.0 g/dL   RDW 13.7 11.5 - 15.5 %   Platelets 136 (L) 150 - 400 K/uL   Neutrophils Relative % 68 %   Neutro Abs 3.6 1.7 - 7.7 K/uL   Lymphocytes Relative 10 %   Lymphs Abs 0.5 (L) 0.7 - 4.0 K/uL   Monocytes Relative 14 %   Monocytes Absolute 0.7 0.1 - 1.0 K/uL   Eosinophils Relative 7 %   Eosinophils Absolute 0.3 0.0 - 0.7 K/uL   Basophils Relative 1 %   Basophils Absolute 0.0 0.0 - 0.1 K/uL  Basic metabolic panel  Result Value Ref Range   Sodium 142 135 - 145 mmol/L   Potassium 4.5 3.5 - 5.1 mmol/L   Chloride 110 101 - 111 mmol/L   CO2 24 22 - 32 mmol/L   Glucose, Bld 111 (H) 65 - 99 mg/dL   BUN 32 (H) 6 - 20 mg/dL   Creatinine, Ser 1.07 (H) 0.44 - 1.00 mg/dL   Calcium 9.1 8.9  - 10.3 mg/dL   GFR calc non Af Amer 46 (L) >60 mL/min   GFR calc Af Amer 53 (L) >60 mL/min   Anion gap 8 5 - 15   I have personally reviewed and evaluated these lab results as part of my medical decision-making.   MDM   Final diagnoses:  Cellulitis of chest wall  Renal insufficiency    Cellulitis of the left chest wall at site of prior meniscectomy and where she is currently getting radiation. This does appear to be a true infection and not just radiation-induced changes. Old records are reviewed and her oncologist did notice some mild erythema at the last visit 1 week ago. WBC is normal. Metabolic panel does show slight elevation in BUN and creatinine over baseline and patient is encouraged to drink fluids. She is nontoxic in appearance and I believe that this can be managed with antibiotics as an outpatient. She has an appointment scheduled later today with her radiation oncologist and she is to keep that appointment. She is discharged with prescription for clindamycin and also given a prescription for tramadol for pain that is not controlled with over-the-counter analgesics. Patient expresses understanding. Return precautions given.  I personally performed the services described in this documentation, which was scribed in my presence. The recorded information has been reviewed and is accurate.      Delora Fuel, MD [id number]

## 2016-03-09 NOTE — ED Notes (Signed)
Pt received radiation last Friday and Tuesday she noticed drainage and redness from the area

## 2016-03-09 NOTE — Telephone Encounter (Signed)
Returned call to Gloria [name] [id number] breast scar.  Having small amount of brownish drainage from the left breast scar area and an open area that started on Tuesday, 03-08-16.  Requesting to have an assessment of the left breast scar area tomorrow.  Appointment given for 4:30 p.m. To see Dr. Pablo Ledger.

## 2016-03-09 NOTE — Patient Instructions (Addendum)
Your physician recommends that you schedule a follow-up appointment and echo in 4 months. (SEPTEMBER)

## 2016-03-10 ENCOUNTER — Ambulatory Visit
Admission: RE | Admit: 2016-03-10 | Discharge: 2016-03-10 | Disposition: A | Discharge status: Home / Self Care | Payer: PPO | Source: Ambulatory Visit | Attending: Radiation Oncology | Admitting: Radiation Oncology

## 2016-03-10 ENCOUNTER — Encounter: Payer: Self-pay | Admitting: Radiation Oncology

## 2016-03-10 ENCOUNTER — Telehealth: Payer: Self-pay | Admitting: *Deleted

## 2016-03-10 VITALS — BP 123/68 | HR 92 | Temp 97.4°F | Resp 16 | Ht 61.0 in | Wt 125.3 lb

## 2016-03-10 DIAGNOSIS — C50112 Malignant neoplasm of central portion of left female breast: Secondary | ICD-10-CM

## 2016-03-10 MED ORDER — CLINDAMYCIN HCL 300 MG PO CAPS: 300.0000 mg | ORAL_CAPSULE | Freq: Four times a day (QID) | ORAL | Status: DC

## 2016-03-10 MED ORDER — TRAMADOL HCL 50 MG PO TABS: 50.0000 mg | ORAL_TABLET | Freq: Two times a day (BID) | ORAL | Status: DC | PRN

## 2016-03-10 NOTE — Progress Notes (Signed)
Mrs. Koellner is here for follow up of cancer of central portion of left female breast.  Started with redness,pain and drainage four days ago.  Went to Emergency room 03-09-16 started on Clindamycin 300 mg qid x 7 days and Tramadol for pain for cellulitis of left chest wall.     Skin status: Redness,drainage yellowish to left chest wall moist desquamation Lotion being used: Radiaplex gel used yesterday Have you seen your medical oncologist? Date If not ,when is appointment [name] 05-09-16 ER+,have started AI or Tamoxifen? If not, why?  Anastrozole  Appetite: not as good. Pain:3/10 to left chest wall Arm mobility:Able to raise left arm without discomfort Fatigue:Having fatigue after lunch most a little. BP 123/68 mmHg  Pulse 92  Temp(Src) 97.4 F (36.3 C)  Resp 16  Ht 5\' 1"  (1.549 m)  Wt 125 lb 4.8 oz (56.836 kg)  BMI 23.69 kg/m2  SpO2 98%

## 2016-03-10 NOTE — Progress Notes (Signed)
   Department of Radiation Oncology  Phone:  [phone] Fax:        [phone]   Name: Gloria [name] MRN: [id number]  DOB: 1928-09-02  Date: 03/10/2016  Follow Up Visit Note  Diagnosis: Cancer of central portion of left female breast [facility])   Staging form: Breast, AJCC 7th Edition     Pathologic stage from 08/04/2010: Stage IIB (T2, N1a, cM0) - Signed by Truitt Merle, MD on 12/09/2015  Summary and Interval since last radiation: 1 week from 70 Gy to the left chest wall completed 03/03/16  Interval History: Gloria [name] presents today for unscheduled follow up. She was seen in the ER yesterday for possible cellulitis.  She states she has had weeping and pain since Saturday.  She has had mostly yellow discharge from her chest wall with blood occasionally. She is using a cloth diaper to cover.  She was given clindamycin in the emergency room and asked to follow up here. She has tramadol but is managing her pain with Tylenol. She is accompanied her son.   Physical Exam:  Filed Vitals:   03/10/16 1456  BP: 123/68  Pulse: 92  Temp: 97.4 F (36.3 C)  Resp: 16  Height: 5\' 1"  (1.549 m)  Weight: 125 lb 4.8 oz (56.836 kg)  SpO2: 98%   Moist desquamation over the left chest wall. Some erythema towards the back out of the treatment field. She has some thick yellow secretion along the bottom aspect of the skin.    IMPRESSION: Gloria [name] is a 82 y.o. female with moist desquamation and possible cellulitis  PLAN:  Placed telfa bandages and discussed triple antibiotic ointment at night and just covered during the week.Keep clean with no scrubbing.  Continue clindamycin. Gave dressing and paper tape.  Follow up in 1 weeks.     [name], MD

## 2016-03-10 NOTE — Discharge Instructions (Signed)
Take acetaminophen and/or (ibuprofen or naproxen) as needed for pain. Take tramadol for pain not relieved by acetaminophen/ibuprofen/naproxen. Return if you have pain that is not being adequately controlled, or if you start running a fever. Make sure to drink plenty of fluids.   Cellulitis Cellulitis is an infection of the skin and the tissue beneath it. The infected area is usually red and tender. Cellulitis occurs most often in the arms and lower legs.  CAUSES  Cellulitis is caused by bacteria that enter the skin through cracks or cuts in the skin. The most common types of bacteria that cause cellulitis are staphylococci and streptococci. SIGNS AND SYMPTOMS   Redness and warmth.  Swelling.  Tenderness or pain.  Fever. DIAGNOSIS  Your health care provider can usually determine what is wrong based on a physical exam. Blood tests may also be done. TREATMENT  Treatment usually involves taking an antibiotic medicine. HOME CARE INSTRUCTIONS   Take your antibiotic medicine as directed by your health care provider. Finish the antibiotic even if you start to feel better.  Keep the infected arm or leg elevated to reduce swelling.  Apply a warm cloth to the affected area up to 4 times per day to relieve pain.  Take medicines only as directed by your health care provider.  Keep all follow-up visits as directed by your health care provider. SEEK MEDICAL CARE IF:   You notice red streaks coming from the infected area.  Your red area gets larger or turns dark in color.  Your bone or joint underneath the infected area becomes painful after the skin has healed.  Your infection returns in the same area or another area.  You notice a swollen bump in the infected area.  You develop new symptoms.  You have a fever. SEEK IMMEDIATE MEDICAL CARE IF:   You feel very sleepy.  You develop vomiting or diarrhea.  You have a general ill feeling (malaise) with muscle aches and pains.   This  information is not intended to replace advice given to you by your health care provider. Make sure you discuss any questions you have with your health care provider.   Document Released: 07/26/2005 Document Revised: 07/07/2015 Document Reviewed: 01/01/2012 Elsevier Interactive Patient Education 2016 [employer]  Clindamycin capsules What is this medicine? CLINDAMYCIN (Tees Toh sin) is a lincosamide antibiotic. It is used to treat certain kinds of bacterial infections. It will not work for colds, flu, or other viral infections. This medicine may be used for other purposes; ask your health care provider or pharmacist if you have questions. What should I tell my health care provider before I take this medicine? They need to know if you have any of these conditions: -kidney disease -liver disease -stomach problems like colitis -an unusual or allergic reaction to clindamycin, lincomycin, or other medicines, foods, dyes like tartrazine or preservatives -pregnant or trying to get pregnant -breast-feeding How should I use this medicine? Take this medicine by mouth with a full glass of water. Follow the directions on the prescription label. You can take this medicine with food or on an empty stomach. If the medicine upsets your stomach, take it with food. Take your medicine at regular intervals. Do not take your medicine more often than directed. Take all of your medicine as directed even if you think your are better. Do not skip doses or stop your medicine early. Talk to your pediatrician regarding the use of this medicine in children. Special care may be needed.  Overdosage: If you think you have taken too much of this medicine contact a poison control center or emergency room at once. NOTE: This medicine is only for you. Do not share this medicine with others. What if I miss a dose? If you miss a dose, take it as soon as you can. If it is almost time for your next dose, take only that dose. Do  not take double or extra doses. What may interact with this medicine? -birth control pills -chloramphenicol -erythromycin -kaolin products This list may not describe all possible interactions. Give your health care provider a list of all the medicines, herbs, non-prescription drugs, or dietary supplements you use. Also tell them if you smoke, drink alcohol, or use illegal drugs. Some items may interact with your medicine. What should I watch for while using this medicine? Tell your doctor or healthcare professional if your symptoms do not start to get better or if they get worse. Do not treat diarrhea with over the counter products. Contact your doctor if you have diarrhea that lasts more than 2 days or if it is severe and watery. What side effects may I notice from receiving this medicine? Side effects that you should report to your doctor or health care professional as soon as possible: -allergic reactions like skin rash, itching or hives, swelling of the face, lips, or tongue -dark urine -pain on swallowing -redness, blistering, peeling or loosening of the skin, including inside the mouth -unusual bleeding or bruising -unusually weak or tired -yellowing of eyes or skin Side effects that usually do not require medical attention (report to your doctor or health care professional if they continue or are bothersome): -diarrhea -itching in the rectal or genital area -joint pain -nausea, vomiting -stomach pain This list may not describe all possible side effects. Call your doctor for medical advice about side effects. You may report side effects to FDA at 1-800-FDA-1088. Where should I keep my medicine? Keep out of the reach of children. Store at room temperature between 20 and 25 degrees C (68 and 77 degrees F). Throw away any unused medicine after the expiration date. NOTE: This sheet is a summary. It may not cover all possible information. If you have questions about this medicine, talk  to your doctor, pharmacist, or health care provider.    2016, Elsevier/Gold Standard. (2013-05-22 16:12:32)  Tramadol tablets What is this medicine? TRAMADOL (TRA ma dole) is a pain reliever. It is used to treat moderate to severe pain in adults. This medicine may be used for other purposes; ask your health care provider or pharmacist if you have questions. What should I tell my health care provider before I take this medicine? They need to know if you have any of these conditions: -brain tumor -depression -drug abuse or addiction -head injury -if you frequently drink alcohol containing drinks -kidney disease or trouble passing urine -liver disease -lung disease, asthma, or breathing problems -seizures or epilepsy -suicidal thoughts, plans, or attempt; a previous suicide attempt by you or a family member -an unusual or allergic reaction to tramadol, codeine, other medicines, foods, dyes, or preservatives -pregnant or trying to get pregnant -breast-feeding How should I use this medicine? Take this medicine by mouth with a full glass of water. Follow the directions on the prescription label. If the medicine upsets your stomach, take it with food or milk. Do not take more medicine than you are told to take. Talk to your pediatrician regarding the use of this medicine in children.  Special care may be needed. Overdosage: If you think you have taken too much of this medicine contact a poison control center or emergency room at once. NOTE: This medicine is only for you. Do not share this medicine with others. What if I miss a dose? If you miss a dose, take it as soon as you can. If it is almost time for your next dose, take only that dose. Do not take double or extra doses. What may interact with this medicine? Do not take this medicine with any of the following medications: -MAOIs like Carbex, Eldepryl, Marplan, Nardil, and Parnate This medicine may also interact with the following  medications: -alcohol or medicines that contain alcohol -antihistamines -benzodiazepines -bupropion -carbamazepine or oxcarbazepine -clozapine -cyclobenzaprine -digoxin -furazolidone -linezolid -medicines for depression, anxiety, or psychotic disturbances -medicines for migraine headache like almotriptan, eletriptan, frovatriptan, naratriptan, rizatriptan, sumatriptan, zolmitriptan -medicines for pain like pentazocine, buprenorphine, butorphanol, meperidine, nalbuphine, and propoxyphene -medicines for sleep -muscle relaxants -naltrexone -phenobarbital -phenothiazines like perphenazine, thioridazine, chlorpromazine, mesoridazine, fluphenazine, prochlorperazine, promazine, and trifluoperazine -procarbazine -warfarin This list may not describe all possible interactions. Give your health care provider a list of all the medicines, herbs, non-prescription drugs, or dietary supplements you use. Also tell them if you smoke, drink alcohol, or use illegal drugs. Some items may interact with your medicine. What should I watch for while using this medicine? Tell your doctor or health care professional if your pain does not go away, if it gets worse, or if you have new or a different type of pain. You may develop tolerance to the medicine. Tolerance means that you will need a higher dose of the medicine for pain relief. Tolerance is normal and is expected if you take this medicine for a long time. Do not suddenly stop taking your medicine because you may develop a severe reaction. Your body becomes used to the medicine. This does NOT mean you are addicted. Addiction is a behavior related to getting and using a drug for a non-medical reason. If you have pain, you have a medical reason to take pain medicine. Your doctor will tell you how much medicine to take. If your doctor wants you to stop the medicine, the dose will be slowly lowered over time to avoid any side effects. You may get drowsy or dizzy. Do  not drive, use machinery, or do anything that needs mental alertness until you know how this medicine affects you. Do not stand or sit up quickly, especially if you are an older patient. This reduces the risk of dizzy or fainting spells. Alcohol can increase or decrease the effects of this medicine. Avoid alcoholic drinks. You may have constipation. Try to have a bowel movement at least every 2 to 3 days. If you do not have a bowel movement for 3 days, call your doctor or health care professional. Your mouth may get dry. Chewing sugarless gum or sucking hard candy, and drinking plenty of water may help. Contact your doctor if the problem does not go away or is severe. What side effects may I notice from receiving this medicine? Side effects that you should report to your doctor or health care professional as soon as possible: -allergic reactions like skin rash, itching or hives, swelling of the face, lips, or tongue -breathing difficulties, wheezing -confusion -itching -light headedness or fainting spells -redness, blistering, peeling or loosening of the skin, including inside the mouth -seizures Side effects that usually do not require medical attention (report to your doctor or health  care professional if they continue or are bothersome): -constipation -dizziness -drowsiness -headache -nausea, vomiting This list may not describe all possible side effects. Call your doctor for medical advice about side effects. You may report side effects to FDA at 1-800-FDA-1088. Where should I keep my medicine? Keep out of the reach of children. This medicine may cause accidental overdose and death if it taken by other adults, children, or pets. Mix any unused medicine with a substance like cat litter or coffee grounds. Then throw the medicine away in a sealed container like a sealed bag or a coffee can with a lid. Do not use the medicine after the expiration date. Store at room temperature between 15 and 30  degrees C (59 and 86 degrees F). NOTE: This sheet is a summary. It may not cover all possible information. If you have questions about this medicine, talk to your doctor, pharmacist, or health care provider.    2016, Elsevier/Gold Standard. (2013-12-12 15:42:09)

## 2016-03-10 NOTE — Telephone Encounter (Signed)
Mrs. Gloria [name] returned the telephone call in re: to her follow up appointment for Thursday, 03-16-16 at 420 p.m. with Dr. Pablo Ledger.

## 2016-03-10 NOTE — Telephone Encounter (Signed)
Called no answer left message for Gloria [name] to call me back at [phone] today by 5 p.m.  or Monday re: appointment for follow up Thursday, 03-16-16 at 420 p.m. To see Dr. Pablo Ledger.

## 2016-03-13 NOTE — Progress Notes (Signed)
  Radiation Oncology         [phone] ________________________________  Name: Gloria [name] MRN: [id number]  Date: 03/03/2016  DOB: 10-Jul-1928  End of Treatment Note  Diagnosis:   Cancer of central portion of left female breast [facility])   Staging form: Breast, AJCC 7th Edition     Pathologic stage from 08/04/2010: Stage IIB (T2, N1a, cM0) - Signed by Truitt Merle, MD on 12/09/2015  Indication for treatment:  Palliative       Radiation treatment dates:   02/21/2016-03/03/2016  Site/dose:   70 Gy to the left chest wall  Beams/energy:   En face electrons were used with 6 and 9 MeV energies  Narrative: The patient tolerated radiation treatment relatively well.   She did not have a complete response upon evaluation at 50 Gy so she elected to proceed with another 10 fractions which she tolerated well.   Plan: The patient has completed radiation treatment. The patient will return to radiation oncology clinic for routine followup in one month. I advised them to call or return sooner if they have any questions or concerns related to their recovery or treatment.  ------------------------------------------------  [name], MD

## 2016-03-15 ENCOUNTER — Encounter: Payer: Self-pay | Admitting: Cardiovascular Disease

## 2016-03-15 DIAGNOSIS — R079 Chest pain, unspecified: Secondary | ICD-10-CM | POA: Insufficient documentation

## 2016-03-15 NOTE — Progress Notes (Signed)
Patient ID: Gloria [name], female   DOB: Sep 11, 1928, 82 y.o.   MRN: [id number]     Primary M.D.: Dr. Lajean Manes  HPI: Gloria [name] is a 82 y.o. female who presents fora cardiology followup evaluation.  I have not seen her since February 2015  Gloria [name] has a history of mild nonobstructive CAD involving her LAD, diagonal and right coronary artery by catheterization which was last done by me on 09/08/2011.  Additional problems include a history of hypertension,  breast CA and GERD.Marland Kitchen She status post left mastectomy and radiation therapy and had been on chronic tamoxifen. In the past, she did note some dizziness which she attributed to her tamoxifen. She had noticed some leg discomfort and  Doppler studies were normal. A 2-D echo Doppler study on 05/09/2013 showed an ejection fraction of 55-60% , mild tricuspid regurgitation, mild pulmonary hypertension with estimated PA pressure 34 mm.  ent leg swelling.  Since I last saw her, she developed recurrent breast cancer.  Recently completed several weeks of radiation therapy.  She is now on anastrozole.  She has developed a radiation burn from her recent radiation therapy.  She has noticed also some  musculoskeletal tenderness to this region.  She denies any exertionally precipitated chest pressure.  She has been taking 81 mg aspirin.  She has continued to take losartan 50 mg daily for blood pressure control.  She denies any difficulty with palpitations.  In light of her recent chest pain, she presents for cardiac evaluation.  Past Medical History  Diagnosis Date  . Cancer [facility])     breast- left  . GERD (gastroesophageal reflux disease)   . Hypertension   . Anxiety   . Depression   . Blood in urine   . Knee fracture   . CAD (coronary artery disease)     non obstructive by cath  . TR (tricuspid regurgitation)     mild by Echo 12/2008 EF >55%    Past Surgical History  Procedure Laterality Date  . Tonsillectomy    . Breast surgery   08-30-10    mastectomy  . Colon surgery    . Abdominal hysterectomy    . Cardiac catheterization  08/2011    20% LAD stenosis, 20% diagonal stenosis, 10-20% proximal dominant RCA stenosis  . Left heart catheterization with coronary angiogram N/A 09/08/2011    Procedure: LEFT HEART CATHETERIZATION WITH CORONARY ANGIOGRAM;  Surgeon: Troy Sine, MD;  Location: [facility] CATH LAB;  Service: Cardiovascular;  Laterality: N/A;  . Abdominal aortagram N/A 09/08/2011    Procedure: ABDOMINAL Maxcine Ham;  Surgeon: Troy Sine, MD;  Location: [facility] CATH LAB;  Service: Cardiovascular;  Laterality: N/A;  . Minor breast biopsy Left 11/25/2015    Procedure: MINOR EXCISION MASS LEFT CHEST WALL;  Surgeon: Autumn Messing III, MD;  Location: [place];  Service: General;  Laterality: Left;    Allergies  Allergen Reactions  . Contrast Media [Iodinated Diagnostic Agents] Anaphylaxis  . Penicillins Anaphylaxis and Swelling    Has patient had a PCN reaction causing immediate rash, facial/tongue/throat swelling, SOB or lightheadedness with hypotension: Yes Has patient had a PCN reaction causing severe rash involving mucus membranes or skin necrosis: No Has patient had a PCN reaction that required hospitalization Yes Has patient had a PCN reaction occurring within the last 10 years: No If all of the above answers are "NO", then may proceed with Cephalosporin use.   . Adhesive [Tape] Other (See Comments)  blisters  . Cephalexin Swelling  . Ciprofloxacin Swelling  . Demerol Nausea And Vomiting  . Sulfamethoxazole-Trimethoprim Itching and Swelling    REACTION: swelling/hives  . Quinolones Itching and Rash    REACTION: itching, rash Pt. Reports no problems with levaquin    Current Outpatient Prescriptions  Medication Sig Dispense Refill  . ALPRAZolam (XANAX) 0.25 MG tablet Take 1/2 tablet prior to radiation and 1 tablet prior to bedtime. (Patient not taking: Reported on 03/10/2016) 30 tablet 1  .  anastrozole (ARIMIDEX) 1 MG tablet Take 1 tablet (1 mg total) by mouth daily. (Patient not taking: Reported on 03/10/2016) 30 tablet 3  . aspirin 81 MG chewable tablet Chew 81 mg by mouth daily. Reported on 03/10/2016    . cetirizine (ZYRTEC) 10 MG tablet Take 10 mg by mouth as needed for allergies.     Marland Kitchen doxepin (SINEQUAN) 10 MG capsule Take 10 mg by mouth at bedtime.     . fluticasone (FLONASE) 50 MCG/ACT nasal spray Place 1 spray into both nostrils daily as needed for allergies.     Marland Kitchen guaiFENesin (MUCINEX) 600 MG 12 hr tablet Take 600 mg by mouth daily as needed for cough or to loosen phlegm. Reported on 02/22/2016    . losartan (COZAAR) 25 MG tablet Take 25 mg by mouth daily.   0  . nitroGLYCERIN (NITROSTAT) 0.4 MG SL tablet Place 1 tablet (0.4 mg total) under the tongue every 5 (five) minutes as needed for chest pain. <PLEASE MAKE APPOINTMENT> 25 tablet 3  . acetaminophen (TYLENOL) 500 MG tablet Take 1,000 mg by mouth daily as needed for moderate pain. Reported on 03/10/2016    . CALCIUM-MAGNESIUM-VITAMIN D PO Take 1 tablet by mouth 2 (two) times daily.    . clindamycin (CLEOCIN) 300 MG capsule Take 1 capsule (300 mg total) by mouth 4 (four) times daily. X 7 days 28 capsule 0  . naproxen sodium (ANAPROX) 220 MG tablet Take 220 mg by mouth 3 (three) times daily with meals. Reported on 03/10/2016    . potassium chloride (K-DUR) 10 MEQ tablet Take 10 mEq by mouth daily.    . traMADol (ULTRAM) 50 MG tablet Take 1 tablet (50 mg total) by mouth every 12 (twelve) hours as needed. (Patient not taking: Reported on 03/10/2016) 10 tablet 0   No current facility-administered medications for this visit.    Social History   Social History  . Marital Status: Divorced    Spouse Name: N/A  . Number of Children: N/A  . Years of Education: N/A   Occupational History  . Not on file.   Social History Main Topics  . Smoking status: Former Smoker    Quit date: 11/15/1991  . Smokeless tobacco: Never Used  .  Alcohol Use: 1.2 oz/week    2 Glasses of wine per week     Comment: 1 glass per week  . Drug Use: No  . Sexual Activity: Not Currently   Other Topics Concern  . Not on file   Social History Narrative    Family History  Problem Relation Age of Onset  . Cancer Father     prostate  . Cancer Sister     breast  . Heart disease Sister   . Cancer Brother     pancreatic  . Cancer Maternal Grandmother     colon  . Dementia Mother   . Sudden death Brother   . Heart attack Brother   . Heart attack Brother   . Arthritis  Sister     ROS General: Negative; No fevers, chills, or night sweats;  HEENT: Positive for a right hearing aid; No changes in vision, sinus congestion, difficulty swallowing Pulmonary: Negative; No cough, wheezing, shortness of breath, hemoptysis Cardiovascular: Negative; No chest pain, presyncope, syncope, palpitations GI: Negative; No nausea, vomiting, diarrhea, or abdominal pain GU: Negative; No dysuria, hematuria, or difficulty voiding Musculoskeletal: Negative; no myalgias, joint pain, or weakness Hematologic/Oncology: Positive for recurrent breast CA, status post recent chemotherapy with radiation-induced skin injury Endocrine: Negative; no heat/cold intolerance; no diabetes Neuro: Negative; no changes in balance, headaches Skin: Negative; No rashes or skin lesions Psychiatric: Negative; No behavioral problems, depression Sleep: Negative; No snoring, daytime sleepiness, hypersomnolence, bruxism, restless legs, hypnogognic hallucinations, no cataplexy Other comprehensive 14 point system review is negative.    PE BP 111/75 mmHg  Ht 5' (1.524 m)  Wt 122 lb 9.6 oz (55.611 kg)  BMI 23.94 kg/m2   Wt Readings from Last 3 Encounters:  03/10/16 125 lb 4.8 oz (56.836 kg)  03/09/16 122 lb (55.339 kg)  03/09/16 122 lb 9.6 oz (55.611 kg)   General: Alert, oriented, no distress.  Skin: Skin breakdown secondary to radiation extending from the left midaxillary  line to the sternum HEENT: Normocephalic, atraumatic. Pupils round and reactive; sclera anicteric;no lid lag. No xanthelasmas Nose without nasal septal hypertrophy Mouth/Parynx benign; Mallinpatti scale 2 Neck: No JVD, no carotid bruits with normal carotid upstroke Lungs: clear to ausculatation and percussion; no wheezing or rales Chest wall: Significant erythema with tenderness secondary to radiation-induced skin injury extending from the left mid axillary line to the sternum Heart: RRR, s1 s2 normal; 1/6 sem; no diastolic murmur, S3 or S4. No rubs thrills or heaves the Abdomen: soft, nontender; no hepatosplenomehaly, BS+; abdominal aorta nontender and not dilated by palpation. Back: No CVA tenderness Pulses 2+ Extremities: no clubbing cyanosis or edema, Homan's sign negative  Neurologic: grossly nonfocal; Cranial nerves intact Psychological: Normal affect and mood  ECG: An ECG could not be done today due to the patient's severe tenderness from her radiation-induced skin injury  ECG (independently read by me): Normal sinus rhythm at 73 beats per minute. Nonspecific ST changes. No ectopy. Normal intervals.  Prior ECG of 06/03/2013: Normal sinus rhythm at 64 beats per minute. No ectopy. No significant ST changes.  LABS:  BMP Latest Ref Rng 03/09/2016 03/02/2016 12/09/2015  Glucose 65 - 99 mg/dL 111(H) 63(L) 89  BUN 6 - 20 mg/dL 32(H) 19.1 16.0  Creatinine 0.44 - 1.00 mg/dL 1.07(H) 1.0 1.0  Sodium 135 - 145 mmol/L 142 140 142  Potassium 3.5 - 5.1 mmol/L 4.5 4.3 4.3  Chloride 101 - 111 mmol/L 110 - -  CO2 22 - 32 mmol/L _0 Calcium 8.9 - 10.3 mg/dL 9.1 9.0 9.5   Hepatic Function Latest Ref Rng 03/02/2016 12/09/2015 09/02/2015  Total Protein 6.4 - 8.3 g/dL 6.2(L) 6.8 6.3(L)  Albumin 3.5 - 5.0 g/dL 3.5 3.8 3.6  AST 5 - 34 U/L _1 ALT 0 - 55 U/L _2 Alk Phosphatase 40 - 150 U/L 58 86 85  Total Bilirubin 0.20 - 1.20 mg/dL 0.45 0.75 0.56   CBC Latest Ref Rng 03/09/2016  03/02/2016 12/09/2015  WBC 4.0 - 10.5 K/uL 5.2 4.5 5.6  Hemoglobin 12.0 - 15.0 g/dL 11.8(L) 13.0 13.8  Hematocrit 36.0 - 46.0 % 35.7(L) 39.9 42.2  Platelets 150 - 400 K/uL 136(L) 128(L) 158   Lab Results  Component Value Date  TSH 0.676 05/06/2013   Lab Results  Component Value Date   HGBA1C  07/23/2009    5.2 (NOTE) The ADA recommends the following therapeutic goal for glycemic control related to Hgb A1c measurement: Goal of therapy: <6.5 Hgb A1c  Reference: American Diabetes Association: Clinical Practice Recommendations 2010, Diabetes Care, 2010, 33: (Suppl  1).   Lipid Panel     Component Value Date/Time   CHOL  11/20/2007 0505    129        ATP III CLASSIFICATION:  <200     mg/dL   Desirable  200-239  mg/dL   Borderline High  >=240    mg/dL   High   TRIG 78 11/20/2007 0505   HDL 62 11/20/2007 0505   CHOLHDL 2.1 11/20/2007 0505   VLDL 16 11/20/2007 0505   LDLCALC  11/20/2007 0505    51        Total Cholesterol/HDL:CHD Risk Coronary Heart Disease Risk Table                     Men   Women  1/2 Average Risk   3.4   3.3    RADIOLOGY: No results found.    ASSESSMENT AND PLAN: Gloria [name] is an 82 year old female with documented mild nonobstructive CAD by cardiac catheterization on 09/08/2011. Since her catheterization.  She has been without anginal symptomatology.  She has a history of hypertension and her blood pressure today is well controlled on losartan 50 mg daily.  She has developed recurrent breast CA and recently completed 7 weeks of radiation treatment.  Remotely, she had been on tamoxifen but this was discontinued several years ago.  She is now on anastrozole.  She has noted some left-sided chest discomfort.  This is associated with her significant radiation-induced skin injury with significant erythema.  She does have chest wall tenderness at this site.  I do not believe her chest pain is ischemic in etiology.  She was to sore in this region to be able to obtain an  ECG, but subsequently this will be done in follow-up office visits.  Since in the past.  She has undergone therapy for breast CT, in 4 months, I'm scheduling her for an echo Doppler study to further evaluate systolic and diastolic function, valvular architecture and wall motion.  Her primary physician, Dr. Felipa Eth had recently checked laboratory.  I will try to obtain these results.  I have also reviewed recent blood work which had been done at the cancer center.  I will see her in 4 months in follow-up of her echo Doppler study and further recommendations will be made at that time.  Time spent: 25 minutes Troy Sine, MD, [facility]  03/15/2016 1:19 PM

## 2016-03-16 ENCOUNTER — Encounter: Payer: Self-pay | Admitting: Radiation Oncology

## 2016-03-16 ENCOUNTER — Ambulatory Visit
Admission: RE | Admit: 2016-03-16 | Discharge: 2016-03-16 | Disposition: A | Discharge status: Home / Self Care | Payer: PPO | Source: Ambulatory Visit | Attending: Radiation Oncology | Admitting: Radiation Oncology

## 2016-03-16 VITALS — BP 119/79 | HR 87 | Temp 97.5°F | Ht 61.0 in | Wt 120.0 lb

## 2016-03-16 DIAGNOSIS — C50112 Malignant neoplasm of central portion of left female breast: Secondary | ICD-10-CM

## 2016-03-16 MED ORDER — DOXYCYCLINE HYCLATE 100 MG PO TABS: 100.0000 mg | ORAL_TABLET | Freq: Two times a day (BID) | ORAL | Status: DC

## 2016-03-16 NOTE — Progress Notes (Signed)
   Department of Radiation Oncology  Phone:  [phone] Fax:        [phone]   Name: Gloria [name] CURRENT MRN: [id number]  DOB: 05-24-1928  Date: 03/16/2016  Follow Up Visit Note  Diagnosis: Cancer of central portion of left female breast [facility])   Staging form: Breast, AJCC 7th Edition     Pathologic stage from 08/04/2010: Stage IIB (T2, N1a, cM0) - Signed by Truitt Merle, MD on 12/09/2015  Summary and Interval since last radiation: 2 weeks from 70 Gy to the left chest wall completed 03/03/16  Interval History: [name] presents today for follow up. She is having more pain and drainage. She is taking Aleve or ES tyelnol with good pain control. She is concerned.  She has some diarrhea. She is accompanied by her sister.   Physical Exam:  Filed Vitals:   03/16/16 1605  BP: 119/79  Pulse: 87  Temp: 97.5 F (36.4 C)  Height: 5\' 1"  (1.549 m)  Weight: 120 lb (54.432 kg)   Moist desquamation over the left chest wall has increased. More erythema towards the back out of the treatment field. She has some thick yellow secretion along the bottom aspect of the skin and towards the outer lower quadrant. Areas of dermatitis over to the contralateral breast.   IMPRESSION: [name] is a 82 y.o. female with moist desquamation and possible cellulitis that is worse than last week.   PLAN: Treated with genetian violet.  Switch antibiotics to doxycycline. Continue tylenol.  Follow up on Tuesday.   [name], MD

## 2016-03-16 NOTE — Progress Notes (Signed)
Gloria [name] is here for follow up to check her left breast.  Skin to left breast still  red and very irritated taking Clinadamycin 300 mg qid.  Appetite fair.  Having fatigue during the day.  Pain 3/10 taking Tylenol and Aleve for pain. BP 119/79 mmHg  Pulse 87  Temp(Src) 97.5 F (36.4 C)  Ht 5\' 1"  (1.549 m)  Wt 120 lb (54.432 kg)  BMI 22.69 kg/m2

## 2016-03-21 ENCOUNTER — Encounter: Payer: Self-pay | Admitting: Radiation Oncology

## 2016-03-21 ENCOUNTER — Ambulatory Visit
Admission: RE | Admit: 2016-03-21 | Discharge: 2016-03-21 | Disposition: A | Discharge status: Home / Self Care | Payer: PPO | Source: Ambulatory Visit | Attending: Radiation Oncology | Admitting: Radiation Oncology

## 2016-03-21 VITALS — BP 120/71 | HR 80 | Temp 97.4°F | Resp 16 | Ht 61.0 in | Wt 125.1 lb

## 2016-03-21 DIAGNOSIS — C50112 Malignant neoplasm of central portion of left female breast: Secondary | ICD-10-CM | POA: Insufficient documentation

## 2016-03-21 DIAGNOSIS — B373 Candidiasis of vulva and vagina: Secondary | ICD-10-CM | POA: Insufficient documentation

## 2016-03-21 NOTE — Progress Notes (Signed)
   Department of Radiation Oncology  Phone:  [phone] Fax:        [phone]   Name: Gloria [name] MRN: [id number]  DOB: [birth year]  Date: 03/21/2016  Follow Up Visit Note  Diagnosis: Cancer of central portion of left female breast [facility])   Staging form: Breast, AJCC 7th Edition     Pathologic stage from 08/04/2010: Stage IIB (T2, N1a, cM0) - Signed by Truitt Merle, MD on 12/09/2015  Summary and Interval since last radiation: 2 weeks from 70 Gy to the left chest wall completed 03/03/16  Interval History: Gloria [name] presents today for follow up visit for a skin check of her left breast. Skin to left chest wall still with redness,peeling. Feels things are better overall.  still having yellowish drainage when she lies down at night.She is taking Doxycycline 100 mg bid since 03-16-16. She mentions that she has a vaginal yeast infection from the antibiotic. Appetite is fair. Having fatigue continues during the day. Pain 2/10 taking Tylenol. Able to move her left arm without discomfort.    Physical Exam:  Filed Vitals:   03/21/16 1359  BP: 120/71  Pulse: 80  Temp: 97.4 F (36.3 C)  Resp: 16  Height: 5\' 1"  (1.549 m)  Weight: 125 lb 1.6 oz (56.745 kg)  SpO2: 98%  She has redness over her left chest wall, improved since last week. Only small areas of moist desquamation remain, all less than 1 cm.  IMPRESSION: Gloria [name] is a 82 y.o. female with moist desquamation and possible cellulitis that is worse than last week.   PLAN: She will follow up with me next Tuesday at 2 pm. She will continue antibiotics and pick up some Monistat today to help with the yeast infection.     ------------------------------------------------  [name], MD    This document serves as a record of services personally performed by [name], MD. It was created on her behalf by  Lendon Collar, a trained medical scribe. The creation of this record is based on the scribe's personal observations and the provider's  statements to them. This document has been checked and approved by the attending provider.

## 2016-03-21 NOTE — Progress Notes (Signed)
Gloria [name] is here for follow up visit for a skin check of her left breast.  Skin to left chest wall still with redness,peeling tender to touch, still having yellowish drainage when she lies down at night.She is taking Doxycycline 100 mg bid since 03-16-16.  Appetite is fair.  Having fatigue continues during the day.  Pain 2/10 taking Tylenol.  Ale to move her left arm without discomfort. BP 120/71 mmHg  Pulse 80  Temp(Src) 97.4 F (36.3 C)  Resp 16  Ht 5\' 1"  (1.549 m)  Wt 125 lb 1.6 oz (56.745 kg)  BMI 23.65 kg/m2  SpO2 98%

## 2016-03-24 NOTE — Progress Notes (Signed)
Gloria [name] is here for a follow up visit to re-check her LCW and yeast infection.  Has been using Monisat since last week for the yeast infection.  The yeast infection symptoms have resolved. Skin to LCW  Skin has improved skin looks pink still has areas where there is some peeling,dry desquamation.     Still taking Doxycycline 100 mg po bid started 03-10-16 -03-28-16 stated she had the prescription refilled and has taken 3 from the new prescription.  Appetite is fair.  Fatigue in the afternoon.  Having pain in her low back 2/10 taking Tylenol cold.  Complained of a sore throat  Started two weeks ago. BP 109/73 mmHg  Pulse 76  Temp(Src) 97.6 F (36.4 C) (Oral)  Resp 16  Ht 5\' 1"  (1.549 m)  Wt 122 lb 9.6 oz (55.611 kg)  BMI 23.18 kg/m2  SpO2 98%

## 2016-03-28 ENCOUNTER — Ambulatory Visit
Admission: RE | Admit: 2016-03-28 | Discharge: 2016-03-28 | Disposition: A | Discharge status: Home / Self Care | Payer: PPO | Source: Ambulatory Visit | Attending: Radiation Oncology | Admitting: Radiation Oncology

## 2016-03-28 ENCOUNTER — Encounter: Payer: Self-pay | Admitting: Radiation Oncology

## 2016-03-28 VITALS — BP 109/73 | HR 76 | Temp 97.6°F | Resp 16 | Ht 61.0 in | Wt 122.6 lb

## 2016-03-28 DIAGNOSIS — C50112 Malignant neoplasm of central portion of left female breast: Secondary | ICD-10-CM

## 2016-03-28 NOTE — Progress Notes (Signed)
   Department of Radiation Oncology  Phone:  [phone] Fax:        [phone]   Name: Gloria [name] MRN: [id number]  DOB: 1928/09/13  Date: 03/28/2016  Follow Up Visit Note  Diagnosis: Cancer of central portion of left female breast [facility])   Staging form: Breast, AJCC 7th Edition     Pathologic stage from 08/04/2010: Stage IIB (T2, N1a, cM0) - Signed by Truitt Merle, MD on 12/09/2015  Summary and Interval since last radiation: 3 1/2 weeks  02/21/2016-03/03/2016: 70 Gy to the left chest wall  12/03/2010 - 01/23/2011: The left axilla and SCLV fossa to 52.8 Gy. Her chest wall was not radiated.  Interval History: Gloria [name] presents today for follow up visit for a skin check of her left chest wall and yeast infection. She has been using Monistat since last week for the yeast infection. The yeast infection symptoms have resolved. She is still taking Doxycycline 100 mg po bid. She ha a fair appetite and fatigue in the afternoon. She has pain in her lower back as a 2/10 and is taking Tylenol for this. She also complains of a sore throat that started two weeks ago.  Physical Exam:  Filed Vitals:   03/28/16 1407  BP: 109/73  Pulse: 76  Temp: 97.6 F (36.4 C)  TempSrc: Oral  Resp: 16  Height: 5\' 1"  (1.549 m)  Weight: 122 lb 9.6 oz (55.611 kg)  SpO2: 98%  Skin is completely healed. No evidence of moist desquamation. No drainage.  IMPRESSION: Gloria [name] is a 82 y.o. female who is healing well post radiation treatment.  PLAN: She will follow up with me in September. I have canceled the follow up in June. She will see Dr. Burr [name] and Survivorship in the interim.  ------------------------------------------------  Gloria Silversmith, MD  This document serves as a record of services personally performed by Gloria Silversmith, MD. It was created on her behalf by Gloria [name], a trained medical scribe. The creation of this record is based on the scribe's personal observations and the provider's statements to them.  This document has been checked and approved by the attending provider.

## 2016-03-28 NOTE — Addendum Note (Signed)
Encounter addended by: Malena Edman, RN on: 03/28/2016  5:47 PM<BR>     Documentation filed: Charges VN

## 2016-04-10 ENCOUNTER — Telehealth: Payer: Self-pay | Admitting: Nurse Practitioner

## 2016-04-10 NOTE — Telephone Encounter (Signed)
Returned call to patient after receiving phone message.  Pt received phone message reminding her of visit scheduled in Survivorship clinic this week (04/13/2016) but patient had no idea that appointment had been scheduled.  Asking about purpose of visit.  Explained purpose of survivorship visit including review of care plan.  Pt's questions answered, as appropriate.  Pt to keep appointment on Thursday and call with any questions / problems prior to that time.

## 2016-04-13 ENCOUNTER — Ambulatory Visit (HOSPITAL_BASED_OUTPATIENT_CLINIC_OR_DEPARTMENT_OTHER): Payer: PPO | Admitting: Nurse Practitioner

## 2016-04-13 ENCOUNTER — Encounter: Payer: Self-pay | Admitting: Nurse Practitioner

## 2016-04-13 ENCOUNTER — Ambulatory Visit: Payer: Self-pay | Admitting: Radiation Oncology

## 2016-04-13 VITALS — BP 122/69 | HR 92 | Temp 97.9°F | Resp 18 | Ht 61.0 in | Wt 123.6 lb

## 2016-04-13 DIAGNOSIS — C44501 Unspecified malignant neoplasm of skin of breast: Secondary | ICD-10-CM | POA: Diagnosis not present

## 2016-04-13 DIAGNOSIS — C50112 Malignant neoplasm of central portion of left female breast: Secondary | ICD-10-CM

## 2016-04-13 NOTE — Progress Notes (Signed)
CLINIC:  Cancer Survivorship   REASON FOR VISIT:  Routine follow-up post-treatment for a recent history of breast cancer.  BRIEF ONCOLOGIC HISTORY:  Oncology History   Cancer of central portion of left female breast [facility])   Staging form: Breast, AJCC 7th Edition     Pathologic stage from 08/04/2010: Stage IIB (T2, N1a, cM0) - Signed by Truitt Merle, MD on 12/09/2015       Cancer of central portion of left female breast ([place])   08/03/2010 Mammogram  left nipple retraction, and the palpable mass subareolar left breast at the 6:00 position. Ultrasound confirmed the presence of a mass measuring 2.6 x 1.7 x 2.4 cm   08/04/2010 Initial Biopsy left breast mass biopsy showed an invasive mammary carcinoma with lobular features. ER 95%, PR 97%, Ki-67 17%, HER-2/neu (-)   08/04/2010 Pathologic Stage Stage IIB: T2 N1a   08/11/2010 Imaging left breast mass 4.6 x 4.2 x 2.3 cm with enhancement distortion extending to the nipple retraction. There is questionable anterior mediastinal lymph node seen   07/2010 - 09/03/2010 Anti-estrogen oral therapy neoadjuvant letrozole    09/03/2010 Surgery  left breast mastectomy with sentinel node biopsy on 09/03/2010.   08/2010 - 01/2015 Anti-estrogen oral therapy Tamoxifen, pt stopped on her own    12/03/2010 - 01/23/2011 Radiation Therapy left chest wall and axilla radiaiton    10/19/2015 Progression Left chest wall recurrence, s/p resection on 11/25/2015 with positive margins ([place])    01/03/2016 - 03/03/2016 Radiation Therapy Radiation to left chest wall recurrence (12/06/2015-02/04/2016 - 44 Gy), pt progressed through the radiation, and had additional 10 fractions of radiation (02/21/16-03/03/2016 - total 70 Gy to the left chest wall)    INTERVAL HISTORY:  Gloria [name] presents to the [facility] today for our initial meeting to review her survivorship care plan detailing her treatment course for breast cancer, as well as monitoring long-term side effects of that treatment,  education regarding health maintenance, screening, and overall wellness and health promotion.     Overall, Gloria [name] reports feeling fairly well since completing her radiation therapy approximately one month ago.  She continues with fatigue intermittently, but it does not impact her ability to do her daily activities.  She did have a yeast infection over the skin which was treated with Monistat per Dr. Pablo Ledger.  She denies headache, cough, shortness of breath or bone pain.  She has a fair appetite and denies any weight loss.  She is taking her anastrozole and tolerating it with some increase in her hot flashes.  They do awaken her at night and she began using her fan last night.  At this time, they are bearable. She takes her nitroglycerin "every now and again."  She states her cardiologist is aware and wants her to have repeat EKG done, which she plans on scheduling ASAP.  REVIEW OF SYSTEMS:  General: Fatigue as above.  Denies fever, chills, or unintentional weight loss. HEENT: Denies visual changes, hearing loss, mouth sores or difficulty swallowing. Cardiac: As above. Respiratory: Denies wheeze or dyspnea on exertion.  Breast: As above. GI: Denies abdominal pain, constipation, diarrhea, nausea, or vomiting.  GU: Denies dysuria, hematuria, vaginal bleeding, vaginal discharge, or vaginal dryness.  Musculoskeletal: As above.  Neuro: Denies recent fall or numbness / tingling in her extremities. Skin: Denies rash, pruritis, or open wounds.  Psych: Denies depression, anxiety, insomnia, or memory loss.   A 14-point review of systems was completed and was negative, except as noted above.  ONCOLOGY TREATMENT TEAM:  1. Surgeon:  Dr. Marlou Starks at [facility]  2. Medical Oncologist: [name] 3. Radiation Oncologist: Dr. Pablo Ledger    PAST MEDICAL/SURGICAL HISTORY:  Past Medical History  Diagnosis Date  . Cancer [facility])     breast- left  . GERD (gastroesophageal reflux disease)   .  Hypertension   . Anxiety   . Depression   . Blood in urine   . Knee fracture   . CAD (coronary artery disease)     non obstructive by cath  . TR (tricuspid regurgitation)     mild by Echo 12/2008 EF >55%   Past Surgical History  Procedure Laterality Date  . Tonsillectomy    . Breast surgery  08-30-10    mastectomy  . Colon surgery    . Abdominal hysterectomy    . Cardiac catheterization  08/2011    20% LAD stenosis, 20% diagonal stenosis, 10-20% proximal dominant RCA stenosis  . Left heart catheterization with coronary angiogram N/A 09/08/2011    Procedure: LEFT HEART CATHETERIZATION WITH CORONARY ANGIOGRAM;  Surgeon: Troy Sine, MD;  Location: [facility] CATH LAB;  Service: Cardiovascular;  Laterality: N/A;  . Abdominal aortagram N/A 09/08/2011    Procedure: ABDOMINAL Maxcine Ham;  Surgeon: Troy Sine, MD;  Location: [facility] CATH LAB;  Service: Cardiovascular;  Laterality: N/A;  . Minor breast biopsy Left 11/25/2015    Procedure: MINOR EXCISION MASS LEFT CHEST WALL;  Surgeon: Autumn Messing III, MD;  Location: [place];  Service: General;  Laterality: Left;     ALLERGIES:  Allergies  Allergen Reactions  . Contrast Media [Iodinated Diagnostic Agents] Anaphylaxis  . Penicillins Anaphylaxis and Swelling    Has patient had a PCN reaction causing immediate rash, facial/tongue/throat swelling, SOB or lightheadedness with hypotension: Yes Has patient had a PCN reaction causing severe rash involving mucus membranes or skin necrosis: No Has patient had a PCN reaction that required hospitalization Yes Has patient had a PCN reaction occurring within the last 10 years: No If all of the above answers are "NO", then may proceed with Cephalosporin use.   . Adhesive [Tape] Other (See Comments)    blisters  . Cephalexin Swelling  . Ciprofloxacin Swelling  . Demerol Nausea And Vomiting  . Sulfamethoxazole-Trimethoprim Itching and Swelling    REACTION: swelling/hives  . Quinolones Itching  and Rash    REACTION: itching, rash Pt. Reports no problems with levaquin     CURRENT MEDICATIONS:  Current Outpatient Prescriptions on File Prior to Visit  Medication Sig Dispense Refill  . acetaminophen (TYLENOL) 500 MG tablet Take 1,000 mg by mouth daily as needed for moderate pain. Reported on 03/10/2016    . ALPRAZolam (XANAX) 0.25 MG tablet Take 1/2 tablet prior to radiation and 1 tablet prior to bedtime. 30 tablet 1  . anastrozole (ARIMIDEX) 1 MG tablet Take 1 tablet (1 mg total) by mouth daily. 30 tablet 3  . aspirin 81 MG chewable tablet Chew 81 mg by mouth once a week. Reported on 03/10/2016    . CALCIUM-MAGNESIUM-VITAMIN D PO Take 1 tablet by mouth 2 (two) times daily.    . cetirizine (ZYRTEC) 10 MG tablet Take 10 mg by mouth as needed for allergies.     Marland Kitchen doxepin (SINEQUAN) 10 MG capsule Take 10 mg by mouth at bedtime.     . fluticasone (FLONASE) 50 MCG/ACT nasal spray Place 1 spray into both nostrils daily as needed for allergies.     Marland Kitchen guaiFENesin (MUCINEX) 600  MG 12 hr tablet Take 600 mg by mouth daily as needed for cough or to loosen phlegm. Reported on 03/28/2016    . losartan (COZAAR) 25 MG tablet Take 25 mg by mouth daily.   0  . nitroGLYCERIN (NITROSTAT) 0.4 MG SL tablet Place 1 tablet (0.4 mg total) under the tongue every 5 (five) minutes as needed for chest pain. <PLEASE MAKE APPOINTMENT> 25 tablet 3  . potassium chloride (K-DUR) 10 MEQ tablet Take 10 mEq by mouth daily.    . pseudoephedrine-acetaminophen (TYLENOL SINUS) 30-500 MG TABS tablet Take 1 tablet by mouth every 4 (four) hours as needed.     No current facility-administered medications on file prior to visit.     ONCOLOGIC FAMILY HISTORY:  Family History  Problem Relation Age of Onset  . Cancer Father     prostate  . Cancer Sister     breast  . Heart disease Sister   . Cancer Brother     pancreatic  . Cancer Maternal Grandmother     colon  . Dementia Mother   . Sudden death Brother   . Heart attack  Brother   . Heart attack Brother   . Arthritis Sister      GENETIC COUNSELING/TESTING: No    SOCIAL HISTORY:  Gloria [name] is divorced and lives alone in [place], [place].  Gloria [name] is currently retired  She is a former smoker and denies any current or history of illicit drug use.  She uses wine a couple times / week.   PHYSICAL EXAMINATION:  Vital Signs: Filed Vitals:   04/13/16 1449  BP: 122/69  Pulse: 92  Temp: 97.9 F (36.6 C)  Resp: 18   ECOG Performance Status: 1  General: Well-nourished, well-appearing female in no acute distress.  She is unaccompanied in clinic today.   HEENT: Head is atraumatic and normocephalic.  Pupils equal and reactive to light and accomodation. Conjunctivae clear without exudate.  Sclerae anicteric. Oral mucosa is pink, moist, and intact without lesions.  Oropharynx is pink without lesions or erythema.  Lymph: No cervical, supraclavicular, infraclavicular, or axillary lymphadenopathy noted on palpation.  Cardiovascular: Regular rate and rhythm without murmurs, rubs, or gallops. Respiratory: Clear to auscultation bilaterally. Chest expansion symmetric without accessory muscle use on inspiration or expiration.  GI: Abdomen soft and round. No tenderness to palpation. Bowel sounds normoactive in 4 quadrants. GU: Deferred.    Neuro: No focal deficits. Steady gait.  Psych: Mood and affect normal and appropriate for situation.  Extremities: No edema, cyanosis, or clubbing.  Skin: Warm and dry. No open lesions noted.   LABORATORY DATA:  None for this visit.  DIAGNOSTIC IMAGING:  None for this visit.     ASSESSMENT AND PLAN:   1. History of breast cancer: Recurrent ER/PR positive stage IIB invasive lobular carcinoma of the left breast, initially diagnosed in 2011 and treated with neoadjuvant endocrine therapy with letrozole from October to November 2011, followed by left mastectomy/SLNB (08/2010) then begun on adjuvant tamoxifen, with  patient discontinuing medication in 01/2015; recurrence noted 09/2015 S/P resection (10/2015) with positive margins, S/P adjuvant radiation therapy with progression noted during treatment, extended and completed 02/2016, now back on anastrozole.  She will follow-up with her medical oncologist,  [name], in July 2017 with history and physical examination per surveillance protocol and will see Dr. Pablo Ledger in September 2017.  She will continue her anti-estrogen therapy with anastrozole at this time and will notify us of any new /  change in symptoms.  A comprehensive survivorship care plan and treatment summary was reviewed with the patient today detailing her breast cancer diagnosis, treatment course, potential late/long-term effects of treatment, appropriate follow-up care with recommendations for the future, and patient education resources.  A copy of this summary, along with a letter will be sent to the patient's primary care provider via in basket message after today's visit.  Gloria [name] is welcome to return to the Survivorship Clinic in the future, as needed; no follow-up will be scheduled at this time.  Regarding her use of the NTG, she plans on obtaining the EKG as recommended by her cardiologist and will follow up with them, seeking emergency care if needed.  2. Bone health:  Given Gloria [name]'s age/history of breast cancer and her current treatment regimen including endocrine therapy with anastrozole, she is at risk for bone demineralization.  Per our records, her last DEXA scan was performed in 2013 and is due repeat imaging.  She will discuss this further at her upcoming appointment with [name].   We will continue to monitor this closely while she is on endocrine therapy.  In the meantime, she was encouraged to increase her consumption of foods rich in calcium and vitamin D as well as to increase her weight-bearing activities.  She was given education on specific activities to promote bone health.  3.  Cancer screening:  Due to Gloria [name]'s history and her age, she should receive screening for skin cancers.  She is S/P hysterectomy and colonoscopy is no longer is recommended. The information and recommendations are listed on the patient's comprehensive care plan/treatment summary and were reviewed in detail with the patient.    4. Health maintenance and wellness promotion: Gloria [name] was encouraged to consume 5-7 servings of fruits and vegetables per day. We reviewed the "Nutrition Rainbow" handout, as well as discussed recommendations to maximize nutrition and minimize recurrence, such as increased intake of fruits, vegetables, lean proteins, and minimizing the intake of red meats and processed foods.  She was also encouraged to engage in moderate to vigorous exercise for 30 minutes per day most days of the week. We discussed the [employer] fitness program, which is designed for cancer survivors to help them become more physically fit after cancer treatments.  She was instructed to limit her alcohol consumption and continue to abstain from tobacco use. A copy of the "Take Control of Your Health" brochure was given to her reinforcing these recommendations.   5. Support services/counseling: It is not uncommon for this period of the patient's cancer care trajectory to be one of many emotions and stressors.  We discussed an opportunity for her to participate in the next session of [facility] ("Finding Your New Normal") support group series designed for patients after they have completed treatment.   Gloria [name] was encouraged to take advantage of our many other support services programs, support groups, and/or counseling in coping with her new life as a cancer survivor after completing anti-cancer treatment.  She was offered support today through active listening and expressive supportive counseling.  She was given information regarding our available services and encouraged to contact me with any questions or for  help enrolling in any of our support group/programs.    A total of 45 minutes of face-to-face time was spent with this patient with greater than 50% of that time in counseling and care-coordination.   Sylvan Cheese, NP  Survivorship Program [address] [phone]   Note:  PRIMARY CARE PROVIDER [name], [place] [phone]

## 2016-05-09 ENCOUNTER — Encounter: Payer: Self-pay | Admitting: Hematology

## 2016-05-09 ENCOUNTER — Other Ambulatory Visit (HOSPITAL_BASED_OUTPATIENT_CLINIC_OR_DEPARTMENT_OTHER): Payer: PPO

## 2016-05-09 ENCOUNTER — Ambulatory Visit (HOSPITAL_BASED_OUTPATIENT_CLINIC_OR_DEPARTMENT_OTHER): Payer: PPO | Admitting: Hematology

## 2016-05-09 VITALS — BP 117/59 | HR 84 | Temp 97.6°F | Resp 18 | Ht 61.0 in | Wt 124.6 lb

## 2016-05-09 DIAGNOSIS — C44501 Unspecified malignant neoplasm of skin of breast: Secondary | ICD-10-CM | POA: Diagnosis not present

## 2016-05-09 DIAGNOSIS — I10 Essential (primary) hypertension: Secondary | ICD-10-CM | POA: Diagnosis not present

## 2016-05-09 DIAGNOSIS — G8929 Other chronic pain: Secondary | ICD-10-CM

## 2016-05-09 DIAGNOSIS — C50112 Malignant neoplasm of central portion of left female breast: Secondary | ICD-10-CM

## 2016-05-09 DIAGNOSIS — M545 Low back pain: Secondary | ICD-10-CM

## 2016-05-09 DIAGNOSIS — C50912 Malignant neoplasm of unspecified site of left female breast: Secondary | ICD-10-CM

## 2016-05-09 LAB — COMPREHENSIVE METABOLIC PANEL
ALBUMIN: 3.5 g/dL (ref 3.5–5.0)
ALK PHOS: 78 U/L (ref 40–150)
ALT: 16 U/L (ref 0–55)
ANION GAP: 9 meq/L (ref 3–11)
AST: 24 U/L (ref 5–34)
BILIRUBIN TOTAL: 0.42 mg/dL (ref 0.20–1.20)
BUN: 18.2 mg/dL (ref 7.0–26.0)
CALCIUM: 9 mg/dL (ref 8.4–10.4)
CHLORIDE: 108 meq/L (ref 98–109)
CO2: 25 mEq/L (ref 22–29)
CREATININE: 1 mg/dL (ref 0.6–1.1)
EGFR: 54 mL/min/{1.73_m2} — ABNORMAL LOW (ref >90)
Glucose: 86 mg/dl (ref 70–140)
Potassium: 4 mEq/L (ref 3.5–5.1)
Sodium: 142 mEq/L (ref 136–145)
Total Protein: 6.4 g/dL (ref 6.4–8.3)

## 2016-05-09 LAB — CBC WITH DIFFERENTIAL/PLATELET
BASO%: 1 % (ref 0.0–2.0)
BASOS ABS: 0 10*3/uL (ref 0.0–0.1)
EOS ABS: 0.3 10*3/uL (ref 0.0–0.5)
EOS%: 7 % (ref 0.0–7.0)
HCT: 39.5 % (ref 34.8–46.6)
HGB: 13.1 g/dL (ref 11.6–15.9)
LYMPH%: 16.7 % (ref 14.0–49.7)
MCH: 31.4 pg (ref 25.1–34.0)
MCHC: 33.2 g/dL (ref 31.5–36.0)
MCV: 94.7 fL (ref 79.5–101.0)
MONO#: 0.2 10*3/uL (ref 0.1–0.9)
MONO%: 5.8 % (ref 0.0–14.0)
NEUT#: 2.9 10*3/uL (ref 1.5–6.5)
NEUT%: 69.5 % (ref 38.4–76.8)
PLATELETS: 133 10*3/uL — AB (ref 145–400)
RBC: 4.17 10*6/uL (ref 3.70–5.45)
RDW: 12.8 % (ref 11.2–14.5)
WBC: 4.1 10*3/uL (ref 3.9–10.3)
lymph#: 0.7 10*3/uL — ABNORMAL LOW (ref 0.9–3.3)

## 2016-05-09 NOTE — Progress Notes (Signed)
[place]  [phone]) [phone] Fax:([phone]) [phone]  OFFICE PROGRESS NOTE  PATIENT: Gloria [name]   DOB: May 23, 1928  MR#: [id number]  PJK#:[id number]  YK:[id number],NLZ [name], MD [name], MD [name]. [name], MD  DIAGNOSIS:  An 82 year old [place] woman with invasive lobular carcinoma of the left breast diagnosed in 07/2010, chest wall local recurrence in 09/2015 .  Oncology History   Cancer of central portion of left female breast [facility])   Staging form: Breast, AJCC 7th Edition     Pathologic stage from 08/04/2010: Stage IIB (T2, N1a, cM0) - Signed by Truitt Merle, MD on 12/09/2015       Cancer of central portion of left female breast ([place])   08/03/2010 Mammogram  left nipple retraction, and the palpable mass subareolar left breast at the 6:00 position. Ultrasound confirmed the presence of a mass measuring 2.6 x 1.7 x 2.4 cm   08/04/2010 Initial Biopsy left breast mass biopsy showed an invasive mammary carcinoma with lobular features. ER 95%, PR 97%, Ki-67 17%, HER-2/neu (-)   08/04/2010 Pathologic Stage Stage IIB: T2 N1a   08/11/2010 Imaging left breast mass 4.6 x 4.2 x 2.3 cm with enhancement distortion extending to the nipple retraction. There is questionable anterior mediastinal lymph node seen   07/2010 - 09/03/2010 Anti-estrogen oral therapy neoadjuvant letrozole    09/03/2010 Surgery  left breast mastectomy with sentinel node biopsy on 09/03/2010.   08/2010 - 01/2015 Anti-estrogen oral therapy Tamoxifen, pt stopped on her own    12/03/2010 - 01/23/2011 Radiation Therapy left chest wall and axilla radiaiton    10/19/2015 Progression Left chest wall recurrence, s/p resection on 11/25/2015 with positive margins ([place])    01/03/2016 - 03/03/2016 Radiation Therapy Radiation to left chest wall recurrence (12/06/2015-02/04/2016 - 44 Gy), pt progressed through the radiation, and had additional 10 fractions of radiation (02/21/16-03/03/2016 - total 70 Gy to the left chest wall)      CURRENT THERAPY:  Left chest wall Radiation  INTERVAL HISTORY:  Gloria [name] returns for follow up. She is accompanied by her sister to the clinic today. She has recovered well from radiation, feels well overall. She does have low back pain, which is chronic, no other new pain or other complaints. She has good appetite, eats well, remains physically active. She lives independently.  PAST MEDICAL HISTORY: Past Medical History  Diagnosis Date  . Cancer [facility])     breast- left  . GERD (gastroesophageal reflux disease)   . Hypertension   . Anxiety   . Depression   . Blood in urine   . Knee fracture   . CAD (coronary artery disease)     non obstructive by cath  . TR (tricuspid regurgitation)     mild by Echo 12/2008 EF >55%    PAST SURGICAL HISTORY: Past Surgical History  Procedure Laterality Date  . Tonsillectomy    . Breast surgery  08-30-10    mastectomy  . Colon surgery    . Abdominal hysterectomy    . Cardiac catheterization  08/2011    20% LAD stenosis, 20% diagonal stenosis, 10-20% proximal dominant RCA stenosis  . Left heart catheterization with coronary angiogram N/A 09/08/2011    Procedure: LEFT HEART CATHETERIZATION WITH CORONARY ANGIOGRAM;  Surgeon: Troy Sine, MD;  Location: [facility] CATH LAB;  Service: Cardiovascular;  Laterality: N/A;  . Abdominal aortagram N/A 09/08/2011    Procedure: ABDOMINAL Maxcine Ham;  Surgeon: Troy Sine, MD;  Location: [facility] CATH LAB;  Service: Cardiovascular;  Laterality: N/A;  . Minor breast biopsy Left 11/25/2015    Procedure: MINOR EXCISION MASS LEFT CHEST WALL;  Surgeon: Autumn Messing III, MD;  Location: [place] [facility];  Service: General;  Laterality: Left;    FAMILY HISTORY: Family History  Problem Relation Age of Onset  . Cancer Father     prostate  . Cancer Sister     breast  . Heart disease Sister   . Cancer Brother     pancreatic  . Cancer Maternal Grandmother     colon  . Dementia Mother   . Sudden death Brother   .  Heart attack Brother   . Heart attack Brother   . Arthritis Sister     SOCIAL HISTORY: Social History  Substance Use Topics  . Smoking status: Former Smoker    Quit date: 11/15/1991  . Smokeless tobacco: Never Used  . Alcohol Use: 1.2 oz/week    2 Glasses of wine per week     Comment: 1 glass per week    ALLERGIES: Allergies  Allergen Reactions  . Contrast Media [Iodinated Diagnostic Agents] Anaphylaxis  . Penicillins Anaphylaxis and Swelling    Has patient had a PCN reaction causing immediate rash, facial/tongue/throat swelling, SOB or lightheadedness with hypotension: Yes Has patient had a PCN reaction causing severe rash involving mucus membranes or skin necrosis: No Has patient had a PCN reaction that required hospitalization Yes Has patient had a PCN reaction occurring within the last 10 years: No If all of the above answers are "NO", then may proceed with Cephalosporin use.   . Adhesive [Tape] Other (See Comments)    blisters  . Cephalexin Swelling  . Ciprofloxacin Swelling  . Demerol Nausea And Vomiting  . Sulfamethoxazole-Trimethoprim Itching and Swelling    REACTION: swelling/hives  . Quinolones Itching and Rash    REACTION: itching, rash Pt. Reports no problems with levaquin     MEDICATIONS:  Current Outpatient Prescriptions  Medication Sig Dispense Refill  . acetaminophen (TYLENOL) 500 MG tablet Take 1,000 mg by mouth daily as needed for moderate pain. Reported on 03/10/2016    . ALPRAZolam (XANAX) 0.25 MG tablet Take 1/2 tablet prior to radiation and 1 tablet prior to bedtime. 30 tablet 1  . anastrozole (ARIMIDEX) 1 MG tablet Take 1 tablet (1 mg total) by mouth daily. 30 tablet 3  . aspirin 81 MG chewable tablet Chew 81 mg by mouth once a week. Reported on 03/10/2016    . CALCIUM-MAGNESIUM-VITAMIN D PO Take 1 tablet by mouth 2 (two) times daily.    . cetirizine (ZYRTEC) 10 MG tablet Take 10 mg by mouth as needed for allergies.     Marland Kitchen doxepin (SINEQUAN) 10 MG  capsule Take 10 mg by mouth at bedtime.     . fluticasone (FLONASE) 50 MCG/ACT nasal spray Place 1 spray into both nostrils daily as needed for allergies.     Marland Kitchen guaiFENesin (MUCINEX) 600 MG 12 hr tablet Take 600 mg by mouth daily as needed for cough or to loosen phlegm. Reported on 03/28/2016    . losartan (COZAAR) 25 MG tablet Take 25 mg by mouth daily.   0  . nitroGLYCERIN (NITROSTAT) 0.4 MG SL tablet Place 1 tablet (0.4 mg total) under the tongue every 5 (five) minutes as needed for chest pain. <PLEASE MAKE APPOINTMENT> 25 tablet 3  . potassium chloride (K-DUR) 10 MEQ tablet Take 10 mEq by mouth daily.    . pseudoephedrine-acetaminophen (TYLENOL SINUS) 30-500 MG TABS tablet Take  1 tablet by mouth every 4 (four) hours as needed.     No current facility-administered medications for this visit.      REVIEW OF SYSTEMS: A 10 point review of systems was completed and is negative except as noted above.   Health Maintenance  Mammogram: 10/19/2015   PHYSICAL EXAMINATION: BP 117/59 mmHg  Pulse 84  Temp(Src) 97.6 F (36.4 C) (Oral)  Resp 18  Ht _0  (1.549 m)  Wt 124 lb 9.6 oz (56.518 kg)  BMI 23.56 kg/m2  SpO2 98%  GENERAL: Patient is a well appearing female in no acute distress HEENT:  Sclerae anicteric.  Oropharynx clear and moist. No ulcerations or evidence of oropharyngeal candidiasis. Neck is supple.  NODES:   There is no cervical , supraclavicular, or axillary lymphadenopathy palpated.  BREAST EXAM:  Left breast is surgically absent,  incision in the left frontal chest has healed well. (+) Diffuse skin  pigmenttio in left frontal chest, secondary to radiation.  There are 3 small nodules  along the incision line, nontender, suspicious for local recurrence. Right breast and axillas no masses or nodules.   LUNGS:  Clear to auscultation bilaterally.  No wheezes or rhonchi. HEART:  Regular rate and rhythm. No murmur appreciated. ABDOMEN:  Soft, nontender.  Positive, normoactive bowel  sounds. No organomegaly palpated. MSK:  No focal spinal tenderness to palpation. Full range of motion bilaterally in the upper extremities. EXTREMITIES:  No peripheral edema.   SKIN:  Clear with no obvious rashes or skin changes. No nail dyscrasia. NEURO:  Nonfocal. Well oriented.  Appropriate affect.  ECOG FS:  Grade 1 - Symptomatic but completely ambulatory   LAB RESULTS: CBC Latest Ref Rng 05/09/2016 03/09/2016 03/02/2016  WBC 3.9 - 10.3 10e3/uL 4.1 5.2 4.5  Hemoglobin 11.6 - 15.9 g/dL 13.1 11.8(L) 13.0  Hematocrit 34.8 - 46.6 % 39.5 35.7(L) 39.9  Platelets 145 - 400 10e3/uL 133(L) 136(L) 128(L)      CMP Latest Ref Rng 05/09/2016 03/09/2016 03/02/2016  Glucose 70 - 140 mg/dl 86 111(H) 63(L)  BUN 7.0 - 26.0 mg/dL 18.2 32(H) 19.1  Creatinine 0.6 - 1.1 mg/dL 1.0 1.07(H) 1.0  Sodium 136 - 145 mEq/L 142 142 140  Potassium 3.5 - 5.1 mEq/L 4.0 4.5 4.3  Chloride 101 - 111 mmol/L - 110 -  CO2 22 - 29 mEq/L _1 Calcium 8.4 - 10.4 mg/dL 9.0 9.1 9.0  Total Protein 6.4 - 8.3 g/dL 6.4 - 6.2(L)  Total Bilirubin 0.20 - 1.20 mg/dL 0.42 - 0.45  Alkaline Phos 40 - 150 U/L 78 - 58  AST 5 - 34 U/L 24 - 20  ALT 0 - 55 U/L 16 - 10   PATHOLOGY REPORT Diagnosis 11/25/2015 Soft tissue mass, simple excision, Left chest wall - INVASIVE LOBULAR CARCINOMA, PRESENT AT NON ORIENTED TISSUE EDGE(S). - SEE COMMENT. Microscopic Comment The carcinoma appears grade 2. A breast prognostic profile will be performed and the results reported separately. (JDP:gt, 11/26/15).  Results: IMMUNOHISTOCHEMICAL AND MORPHOMETRIC ANALYSIS PERFORMED MANUALLY Estrogen Receptor: 95%, POSITIVE, STRONG STAINING INTENSITY Progesterone Receptor: 60%, POSITIVE, STRONG STAINING INTENSITY Results: HER2 - NEGATIVE RATIO OF HER2/CEP17 SIGNALS 1.07 AVERAGE HER2 COPY NUMBER PER CELL 2.90   RADIOGRAPHIC STUDIES: CT chest, abdomen and pelvis wo contrast 12/23/2015 IMPRESSION: 8 mm soft tissue nodule in the left chest wall  mastectomy bed, likely representing the site of known left breast cancer recurrence.  No evidence of metastatic disease within the chest, abdomen, or Pelvis.  ADDENDUM: This case  was subsequently discussed with [name] by telephone. The recurrent breast carcinoma in the left chest wall mastectomy bed has already been surgically resected. Therefore the small nodular density described in the impression below most likely represents postop change. Recommend continued attention on follow-up imaging, either with CT or breast ultrasound.  Bone scan 12/23/2015 IMPRESSION: No evidence of metastatic disease. Findings suggesting degenerative change about the right sternoclavicular joint, the lower lumbar sacral region, and both knees. Specifically no focal rib lesion identified.   ASSESSMENT/PLAN: 82 y.o. with:  1. Stage IIB, T2 N1 invasive lobular carcinoma, grade 2, ER 95%, PR 97%, Ki-67 17%, HER-2/neu no amplification, diagnosed in 07/2010, local chest wall recurrence in 09/2015   -She was on adjuvant tamoxifen for 4-1/2 years, stopped on her own. -She unfortunately developed local recurrence 8 months after she stopped tamoxifen. -I reviewed her surgical pathology findings, which is consistent with lobular carcinoma recurrence from her previous breast cancer. -The tumor was surgically removed, with positive margins. -Her restaging CT scan and bone scan in 12/2015 was negative for distant metastasis. -She  Has completed cest wall radiation, still has3 small resida nodule , supicious for residual cancer.  I recommend [place] and biopsy  -If biopsy confirms residual cancer, and no surgical options, I will change her anastrozole to fulvestrant and add Ibrance   2. HTN, CAD  -She'll continue follow-up with her primary care physician, she also has appointment with her cardiologist on May 11  Plan -Continue anastrozole -[place] of left chest wall and axilla, and biopsy of chest wall nodule -I will  see her back in 4 weeks   I spent 25 minutes counseling the patient face to face.  The total time spent in the appointment was 30 minutes.  Truitt Merle  05/09/2016

## 2016-05-11 ENCOUNTER — Telehealth: Payer: Self-pay | Admitting: Hematology

## 2016-05-11 NOTE — Telephone Encounter (Signed)
Spoke with pt to confirm 8/8 appt at 145 pm per pof

## 2016-05-23 ENCOUNTER — Other Ambulatory Visit: Payer: PPO

## 2016-05-24 ENCOUNTER — Ambulatory Visit
Admission: RE | Admit: 2016-05-24 | Discharge: 2016-05-24 | Disposition: A | Discharge status: Home / Self Care | Payer: PPO | Source: Ambulatory Visit | Attending: Hematology | Admitting: Hematology

## 2016-05-24 DIAGNOSIS — C50112 Malignant neoplasm of central portion of left female breast: Secondary | ICD-10-CM

## 2016-05-24 DIAGNOSIS — N63 Unspecified lump in breast: Secondary | ICD-10-CM | POA: Diagnosis not present

## 2016-06-06 ENCOUNTER — Other Ambulatory Visit (HOSPITAL_BASED_OUTPATIENT_CLINIC_OR_DEPARTMENT_OTHER): Payer: PPO

## 2016-06-06 ENCOUNTER — Telehealth: Payer: Self-pay | Admitting: Hematology

## 2016-06-06 ENCOUNTER — Encounter: Payer: Self-pay | Admitting: Hematology

## 2016-06-06 ENCOUNTER — Ambulatory Visit (HOSPITAL_BASED_OUTPATIENT_CLINIC_OR_DEPARTMENT_OTHER): Payer: PPO | Admitting: Hematology

## 2016-06-06 VITALS — BP 124/71 | HR 71 | Temp 97.9°F | Resp 18 | Ht 61.0 in | Wt 124.4 lb

## 2016-06-06 DIAGNOSIS — I10 Essential (primary) hypertension: Secondary | ICD-10-CM

## 2016-06-06 DIAGNOSIS — C50912 Malignant neoplasm of unspecified site of left female breast: Secondary | ICD-10-CM

## 2016-06-06 DIAGNOSIS — C50112 Malignant neoplasm of central portion of left female breast: Secondary | ICD-10-CM

## 2016-06-06 DIAGNOSIS — C44501 Unspecified malignant neoplasm of skin of breast: Secondary | ICD-10-CM

## 2016-06-06 LAB — CBC WITH DIFFERENTIAL/PLATELET
BASO%: 1.1 % (ref 0.0–2.0)
BASOS ABS: 0 10*3/uL (ref 0.0–0.1)
EOS ABS: 0.3 10*3/uL (ref 0.0–0.5)
EOS%: 6.8 % (ref 0.0–7.0)
HEMATOCRIT: 42.2 % (ref 34.8–46.6)
HGB: 13.7 g/dL (ref 11.6–15.9)
LYMPH%: 13.2 % — ABNORMAL LOW (ref 14.0–49.7)
MCH: 30.4 pg (ref 25.1–34.0)
MCHC: 32.5 g/dL (ref 31.5–36.0)
MCV: 93.5 fL (ref 79.5–101.0)
MONO#: 0.3 10*3/uL (ref 0.1–0.9)
MONO%: 7.8 % (ref 0.0–14.0)
NEUT#: 3.1 10*3/uL (ref 1.5–6.5)
NEUT%: 71.1 % (ref 38.4–76.8)
Platelets: 139 10*3/uL — ABNORMAL LOW (ref 145–400)
RBC: 4.51 10*6/uL (ref 3.70–5.45)
RDW: 12.8 % (ref 11.2–14.5)
WBC: 4.4 10*3/uL (ref 3.9–10.3)
lymph#: 0.6 10*3/uL — ABNORMAL LOW (ref 0.9–3.3)

## 2016-06-06 LAB — COMPREHENSIVE METABOLIC PANEL
ALBUMIN: 3.7 g/dL (ref 3.5–5.0)
ALK PHOS: 71 U/L (ref 40–150)
ALT: 15 U/L (ref 0–55)
AST: 20 U/L (ref 5–34)
Anion Gap: 8 mEq/L (ref 3–11)
BUN: 18 mg/dL (ref 7.0–26.0)
CALCIUM: 9.5 mg/dL (ref 8.4–10.4)
CHLORIDE: 107 meq/L (ref 98–109)
CO2: 25 mEq/L (ref 22–29)
Creatinine: 0.8 mg/dL (ref 0.6–1.1)
EGFR: 64 mL/min/{1.73_m2} — AB (ref >90)
Glucose: 102 mg/dl (ref 70–140)
POTASSIUM: 4.4 meq/L (ref 3.5–5.1)
SODIUM: 140 meq/L (ref 136–145)
Total Bilirubin: 0.59 mg/dL (ref 0.20–1.20)
Total Protein: 6.6 g/dL (ref 6.4–8.3)

## 2016-06-06 NOTE — Telephone Encounter (Signed)
Gave pt cal & avs °

## 2016-06-06 NOTE — Progress Notes (Signed)
[name]  [phone] [phone]  OFFICE PROGRESS NOTE  PATIENT: Gloria [name]   DOB: 04-09-1928  MR#: [id number]  EGB#:[id number]  PX:[id number],IOE [name], MD [name], MD [name]. [name], MD  DIAGNOSIS:  An 82 year old [place] woman with invasive lobular carcinoma of the left breast diagnosed in 07/2010, chest wall local recurrence in 09/2015 .  Oncology History   Cancer of central portion of left female breast [facility])   Staging form: Breast, AJCC 7th Edition     Pathologic stage from 08/04/2010: Stage IIB (T2, N1a, cM0) - Signed by Truitt Merle, MD on 12/09/2015       Cancer of central portion of left female breast ([place])   08/03/2010 Mammogram     left nipple retraction, and the palpable mass subareolar left breast at the 6:00 position. Ultrasound confirmed the presence of a mass measuring 2.6 x 1.7 x 2.4 cm     08/04/2010 Initial Biopsy    left breast mass biopsy showed an invasive mammary carcinoma with lobular features. ER 95%, PR 97%, Ki-67 17%, HER-2/neu (-)     08/04/2010 Pathologic Stage    Stage IIB: T2 N1a     08/11/2010 Imaging    left breast mass 4.6 x 4.2 x 2.3 cm with enhancement distortion extending to the nipple retraction. There is questionable anterior mediastinal lymph node seen     07/2010 - 09/03/2010 Anti-estrogen oral therapy    neoadjuvant letrozole      09/03/2010 Surgery     left breast mastectomy with sentinel node biopsy on 09/03/2010.     08/2010 - 01/2015 Anti-estrogen oral therapy    Tamoxifen, pt stopped on her own      12/03/2010 - 01/23/2011 Radiation Therapy    left chest wall and axilla radiaiton      10/19/2015 Progression    Left chest wall recurrence, s/p resection on 11/25/2015 with positive margins ([facility])      12/10/2015 -  Anti-estrogen oral therapy    anastrozole 33m daily, which was switched to fulvestrant on 06/13/2016 due to persistent disease.     01/03/2016 - 03/03/2016 Radiation Therapy   Radiation to left chest wall recurrence (12/06/2015-02/04/2016 - 44 Gy), pt progressed through the radiation, and had additional 10 fractions of radiation (02/21/16-03/03/2016 - total 70 Gy to the left chest wall)       CURRENT THERAPY:  We'll change anastrozole to fulvestrant next week  INTERVAL HISTORY:  SNoblereturns for follow up. She is doing well overall. She denies any significant pain, or other discomfort. She has good appetite and energy level, she came to the clinic by herself today.   PAST MEDICAL HISTORY: Past Medical History:  Diagnosis Date  . Anxiety   . Blood in urine   . CAD (coronary artery disease)    non obstructive by cath  . Cancer ([facility]    breast- left  . Depression   . GERD (gastroesophageal reflux disease)   . Hypertension   . Knee fracture   . TR (tricuspid regurgitation)    mild by Echo 12/2008 EF >55%    PAST SURGICAL HISTORY: Past Surgical History:  Procedure Laterality Date  . ABDOMINAL AORTAGRAM N/A 09/08/2011   Procedure: ABDOMINAL AMaxcine Ham  Surgeon: TTroy Sine MD;  [facility];  Service: Cardiovascular;  Laterality: N/A;  . ABDOMINAL HYSTERECTOMY    . BREAST SURGERY  08-30-10   mastectomy  . CARDIAC CATHETERIZATION  08/2011   20% LAD stenosis,  20% diagonal stenosis, 10-20% proximal dominant RCA stenosis  . COLON SURGERY    . LEFT HEART CATHETERIZATION WITH CORONARY ANGIOGRAM N/A 09/08/2011   Procedure: LEFT HEART CATHETERIZATION WITH CORONARY ANGIOGRAM;  Surgeon: Troy Sine, MD;  [facility];  Service: Cardiovascular;  Laterality: N/A;  . MINOR BREAST BIOPSY Left 11/25/2015   Procedure: MINOR EXCISION MASS LEFT CHEST WALL;  Surgeon: Autumn Messing III, MD;  Location: [place];  Service: General;  Laterality: Left;  . TONSILLECTOMY      FAMILY HISTORY: Family History  Problem Relation Age of Onset  . Cancer Father     prostate  . Cancer Sister     breast  . Heart disease Sister   . Cancer Brother      pancreatic  . Cancer Maternal Grandmother     colon  . Dementia Mother   . Sudden death Brother   . Heart attack Brother   . Heart attack Brother   . Arthritis Sister     SOCIAL HISTORY: Social History  Substance Use Topics  . Smoking status: Former Smoker    Quit date: 11/15/1991  . Smokeless tobacco: Never Used  . Alcohol use 1.2 oz/week    2 Glasses of wine per week     Comment: 1 glass per week    ALLERGIES: Allergies  Allergen Reactions  . Contrast Media [Iodinated Diagnostic Agents] Anaphylaxis  . Penicillins Anaphylaxis and Swelling    Has patient had a PCN reaction causing immediate rash, facial/tongue/throat swelling, SOB or lightheadedness with hypotension: Yes Has patient had a PCN reaction causing severe rash involving mucus membranes or skin necrosis: No Has patient had a PCN reaction that required hospitalization Yes Has patient had a PCN reaction occurring within the last 10 years: No If all of the above answers are "NO", then may proceed with Cephalosporin use.   . Adhesive [Tape] Other (See Comments)    blisters  . Cephalexin Swelling  . Ciprofloxacin Swelling  . Demerol Nausea And Vomiting  . Sulfamethoxazole-Trimethoprim Itching and Swelling    REACTION: swelling/hives  . Quinolones Itching and Rash    REACTION: itching, rash Pt. Reports no problems with levaquin     MEDICATIONS:  Current Outpatient Prescriptions  Medication Sig Dispense Refill  . acetaminophen (TYLENOL) 500 MG tablet Take 1,000 mg by mouth daily as needed for moderate pain. Reported on 03/10/2016    . ALPRAZolam (XANAX) 0.25 MG tablet Take 1/2 tablet prior to radiation and 1 tablet prior to bedtime. 30 tablet 1  . anastrozole (ARIMIDEX) 1 MG tablet Take 1 tablet (1 mg total) by mouth daily. 30 tablet 3  . aspirin 81 MG chewable tablet Chew 81 mg by mouth once a week. Reported on 03/10/2016    . CALCIUM-MAGNESIUM-VITAMIN D PO Take 1 tablet by mouth 2 (two) times daily.    .  cetirizine (ZYRTEC) 10 MG tablet Take 10 mg by mouth as needed for allergies.     Marland Kitchen doxepin (SINEQUAN) 10 MG capsule Take 10 mg by mouth at bedtime.     . fluticasone (FLONASE) 50 MCG/ACT nasal spray Place 1 spray into both nostrils daily as needed for allergies.     Marland Kitchen guaiFENesin (MUCINEX) 600 MG 12 hr tablet Take 600 mg by mouth daily as needed for cough or to loosen phlegm. Reported on 03/28/2016    . losartan (COZAAR) 25 MG tablet Take 25 mg by mouth daily.   0  . nitroGLYCERIN (NITROSTAT) 0.4 MG  SL tablet Place 1 tablet (0.4 mg total) under the tongue every 5 (five) minutes as needed for chest pain. <PLEASE MAKE APPOINTMENT> 25 tablet 3  . potassium chloride (K-DUR) 10 MEQ tablet Take 10 mEq by mouth daily.    . pseudoephedrine-acetaminophen (TYLENOL SINUS) 30-500 MG TABS tablet Take 1 tablet by mouth every 4 (four) hours as needed.     No current facility-administered medications for this visit.       REVIEW OF SYSTEMS: A 10 point review of systems was completed and is negative except as noted above.   Health Maintenance  Mammogram: 10/19/2015   PHYSICAL EXAMINATION: BP 124/71 (BP Location: Left Arm, Patient Position: Sitting)   Pulse 71   Temp 97.9 F (36.6 C) (Oral)   Resp 18   Ht 5' 1"  (1.549 m)   Wt 124 lb 6.4 oz (56.4 kg)   SpO2 98%   BMI 23.51 kg/m   GENERAL: Patient is a well appearing female in no acute distress HEENT:  Sclerae anicteric.  Oropharynx clear and moist. No ulcerations or evidence of oropharyngeal candidiasis. Neck is supple.  NODES:   There is no cervical , supraclavicular, or axillary lymphadenopathy palpated.  BREAST EXAM:  Left breast is surgically absent,  incision in the left frontal chest has healed well. (+) mild skin  pigmenttio in left frontal chest, secondary to radiation.  There are 2 small nodules  along the incision line, about 5-49m, nontender, suspicious for local recurrence. Right breast and axillas no masses or nodules.   LUNGS:  Clear  to auscultation bilaterally.  No wheezes or rhonchi. HEART:  Regular rate and rhythm. No murmur appreciated. ABDOMEN:  Soft, nontender.  Positive, normoactive bowel sounds. No organomegaly palpated. MSK:  No focal spinal tenderness to palpation. Full range of motion bilaterally in the upper extremities. EXTREMITIES:  No peripheral edema.   SKIN:  Clear with no obvious rashes or skin changes. No nail dyscrasia. NEURO:  Nonfocal. Well oriented.  Appropriate affect.  ECOG FS:  Grade 1 - Symptomatic but completely ambulatory   LAB RESULTS: CBC Latest Ref Rng & Units 06/06/2016 05/09/2016 03/09/2016  WBC 3.9 - 10.3 10e3/uL 4.4 4.1 5.2  Hemoglobin 11.6 - 15.9 g/dL 13.7 13.1 11.8(L)  Hematocrit 34.8 - 46.6 % 42.2 39.5 35.7(L)  Platelets 145 - 400 10e3/uL 139(L) 133(L) 136(L)      CMP Latest Ref Rng & Units 06/06/2016 05/09/2016 03/09/2016  Glucose 70 - 140 mg/dl 102 86 111(H)  BUN 7.0 - 26.0 mg/dL 18.0 18.2 32(H)  Creatinine 0.6 - 1.1 mg/dL 0.8 1.0 1.07(H)  Sodium 136 - 145 mEq/L 140 142 142  Potassium 3.5 - 5.1 mEq/L 4.4 4.0 4.5  Chloride 101 - 111 mmol/L - - 110  CO2 22 - 29 mEq/L 25 25 24   Calcium 8.4 - 10.4 mg/dL 9.5 9.0 9.1  Total Protein 6.4 - 8.3 g/dL 6.6 6.4 -  Total Bilirubin 0.20 - 1.20 mg/dL 0.59 0.42 -  Alkaline Phos 40 - 150 U/L 71 78 -  AST 5 - 34 U/L 20 24 -  ALT 0 - 55 U/L 15 16 -   PATHOLOGY REPORT Diagnosis 11/25/2015 Soft tissue mass, simple excision, Left chest wall - INVASIVE LOBULAR CARCINOMA, PRESENT AT NON ORIENTED TISSUE EDGE(S). - SEE COMMENT. Microscopic Comment The carcinoma appears grade 2. A breast prognostic profile will be performed and the results reported separately. (JDP:gt, 11/26/15).  Results: IMMUNOHISTOCHEMICAL AND MORPHOMETRIC ANALYSIS PERFORMED MANUALLY Estrogen Receptor: 95%, POSITIVE, STRONG STAINING INTENSITY Progesterone Receptor:  60%, POSITIVE, STRONG STAINING INTENSITY Results: HER2 - NEGATIVE RATIO OF HER2/CEP17 SIGNALS 1.07 AVERAGE  HER2 COPY NUMBER PER CELL 2.90   RADIOGRAPHIC STUDIES: CT chest, abdomen and pelvis wo contrast 12/23/2015 IMPRESSION: 8 mm soft tissue nodule in the left chest wall mastectomy bed, likely representing the site of known left breast cancer recurrence.  No evidence of metastatic disease within the chest, abdomen, or Pelvis.  ADDENDUM: This case was subsequently discussed with [name] by telephone. The recurrent breast carcinoma in the left chest wall mastectomy bed has already been surgically resected. Therefore the small nodular density described in the impression below most likely represents postop change. Recommend continued attention on follow-up imaging, either with CT or breast ultrasound.  Bone scan 12/23/2015 IMPRESSION: No evidence of metastatic disease. Findings suggesting degenerative change about the right sternoclavicular joint, the lower lumbar sacral region, and both knees. Specifically no focal rib lesion identified.  Ultrasound of left chest wall and axilla 05/24/2016 IMPRESSION: Two small subdermal nodules which may represent small chest wall recurrences located underlying the midportion of the left mastectomy incision as discussed above.  ASSESSMENT/PLAN: 82 y.o. with:  1. Stage IIB, T2 N1 invasive lobular carcinoma, grade 2, ER 95%, PR 97%, Ki-67 17%, HER-2/neu no amplification, diagnosed in 07/2010, local chest wall recurrence in 09/2015   -She was on adjuvant tamoxifen for 4.5 years, stopped on her own. -She unfortunately developed local recurrence 8 months after she stopped tamoxifen. -I previously reviewed her surgical pathology findings, which is consistent with lobular carcinoma recurrence from her previous breast cancer. -The tumor was surgically removed, with positive margins. -Her restaging CT scan and bone scan in 12/2015 was negative for distant metastasis. -She has completed chest wall radiation, however developed 2-3 small during the radiation  and anastrozole therapy, supicious for residual cancer. US showed a 4 mm and 6 mm subcutaneous nodule, partially calcified, highly suspicious for local recurrence. -I'll contact Dr. Marlou Starks to see if he would offer second surgery to resect those local recurrence.  -She has been on anastrozole for 6 months, and the chest wall nodule developed during this period. I recommend her to switch to fulvestrant injection. Potential side effects of fulvestrant, such as hot flash, joint and muscle discomfort, injection site pain, osteopenia and osteoporosis, slight increased risk of cardiovascular disease, etc. were discussed with patient. She agrees to proceed. I also talked to her daughter on the phone during the office visit, she agrees with the plan. I gave her a handout of the fulvestrant, for her and her daughter to review. -The other option of fulvestrant plus Ibrance, or exemestane plus everolimus, were discussed with patient. Given her limited disease, and advanced age, I would recommend single agent fulvestrant for now, and reserve the other options as next line treatment. -I'll start her on fulvestrant injection next week, will give 500 mg every 2 weeks X3, then every months afterwards.  -Plan to repeat staging CT and bone scan in 3-6 months.  2. HTN, CAD  -She'll continue follow-up with her primary care physician, she also has appointment with her cardiologist on May 11  Plan -She'll stop anastrozole and changed to fulvestrant injection next week -I'll see her back in 3 weeks before the second injection of fulvestrant.  I spent 25 minutes counseling the patient face to face.  The total time spent in the appointment was 30 minutes.  Truitt Merle  06/06/2016

## 2016-06-13 ENCOUNTER — Ambulatory Visit: Payer: PPO

## 2016-06-13 VITALS — BP 108/77 | HR 86 | Temp 98.2°F | Resp 18

## 2016-06-13 DIAGNOSIS — C50112 Malignant neoplasm of central portion of left female breast: Secondary | ICD-10-CM

## 2016-06-13 DIAGNOSIS — H43392 Other vitreous opacities, left eye: Secondary | ICD-10-CM | POA: Diagnosis not present

## 2016-06-13 DIAGNOSIS — H35043 Retinal micro-aneurysms, unspecified, bilateral: Secondary | ICD-10-CM | POA: Diagnosis not present

## 2016-06-13 DIAGNOSIS — H3562 Retinal hemorrhage, left eye: Secondary | ICD-10-CM | POA: Diagnosis not present

## 2016-06-13 DIAGNOSIS — H43812 Vitreous degeneration, left eye: Secondary | ICD-10-CM | POA: Diagnosis not present

## 2016-06-13 DIAGNOSIS — H35073 Retinal telangiectasis, bilateral: Secondary | ICD-10-CM | POA: Diagnosis not present

## 2016-06-13 DIAGNOSIS — H43811 Vitreous degeneration, right eye: Secondary | ICD-10-CM | POA: Diagnosis not present

## 2016-06-13 MED ORDER — FULVESTRANT 250 MG/5ML IM SOLN: 500.0000 mg | INTRAMUSCULAR | Status: DC

## 2016-06-13 NOTE — Progress Notes (Signed)
Per pharmacy, authorization from insurance is still pending. Gloria [name] to call patient when it is authorized and will update patient on amount of injection. Patient understands we have financial counseling available if cost is to high. Pt will reschedule injection when it has been authorized.   Patient mentioned seeing spots (states "they are not floaters") started at about 11am and has continued, denies any dizziness or blurred vision. VS WNL. Called and informed Beth, RN in [place] to inform Dr. Lewayne Bunting nurse. Pt request to have someone call if needing to do anything for them so she can go home.

## 2016-06-19 NOTE — Telephone Encounter (Signed)
error 

## 2016-06-21 ENCOUNTER — Other Ambulatory Visit: Payer: Self-pay | Admitting: General Surgery

## 2016-06-21 DIAGNOSIS — C50912 Malignant neoplasm of unspecified site of left female breast: Secondary | ICD-10-CM | POA: Diagnosis not present

## 2016-06-27 ENCOUNTER — Ambulatory Visit (HOSPITAL_BASED_OUTPATIENT_CLINIC_OR_DEPARTMENT_OTHER): Payer: PPO | Admitting: Hematology

## 2016-06-27 ENCOUNTER — Ambulatory Visit: Payer: PPO

## 2016-06-27 ENCOUNTER — Encounter: Payer: Self-pay | Admitting: Hematology

## 2016-06-27 VITALS — BP 142/70 | HR 73 | Temp 97.4°F | Resp 18 | Ht 61.0 in | Wt 126.1 lb

## 2016-06-27 DIAGNOSIS — C50112 Malignant neoplasm of central portion of left female breast: Secondary | ICD-10-CM

## 2016-06-27 DIAGNOSIS — I10 Essential (primary) hypertension: Secondary | ICD-10-CM

## 2016-06-27 DIAGNOSIS — C44501 Unspecified malignant neoplasm of skin of breast: Secondary | ICD-10-CM | POA: Diagnosis not present

## 2016-06-27 MED ORDER — TAMOXIFEN CITRATE 20 MG PO TABS: 20.0000 mg | ORAL_TABLET | Freq: Every day | ORAL | 3 refills | Status: DC

## 2016-06-27 NOTE — Progress Notes (Signed)
[place]  [phone]) [phone] [phone]) [phone]  OFFICE PROGRESS NOTE  PATIENT: Gloria [name]   DOB: 07/20/1928  MR#: [id number]  OQH#:[id number]  TW:[id number],ZGY Gloria Moores, MD Gloria Edison, MD Gloria [name]. Gloria Nakayama, MD  DIAGNOSIS:  An 82 year old [place] woman with invasive lobular carcinoma of the left breast diagnosed in 07/2010, chest wall local recurrence in 09/2015 .  Oncology History   Cancer of central portion of left female breast [facility])   Staging form: Breast, AJCC 7th Edition     Pathologic stage from 08/04/2010: Stage IIB (T2, N1a, cM0) - Signed by Truitt Merle, MD on 12/09/2015       Cancer of central portion of left female breast ([place])   08/03/2010 Mammogram     left nipple retraction, and the palpable mass subareolar left breast at the 6:00 position. Ultrasound confirmed the presence of a mass measuring 2.6 x 1.7 x 2.4 cm      08/04/2010 Initial Biopsy    left breast mass biopsy showed an invasive mammary carcinoma with lobular features. ER 95%, PR 97%, Ki-67 17%, HER-2/neu (-)      08/04/2010 Pathologic Stage    Stage IIB: T2 N1a      08/11/2010 Imaging    left breast mass 4.6 x 4.2 x 2.3 cm with enhancement distortion extending to the nipple retraction. There is questionable anterior mediastinal lymph node seen      07/2010 - 09/03/2010 Anti-estrogen oral therapy    neoadjuvant letrozole       09/03/2010 Surgery     left breast mastectomy with sentinel node biopsy on 09/03/2010.      08/2010 - 01/2015 Anti-estrogen oral therapy    Tamoxifen, pt stopped on her own       12/03/2010 - 01/23/2011 Radiation Therapy    left chest wall and axilla radiaiton       10/19/2015 Progression    Left chest wall recurrence, s/p resection on 11/25/2015 with positive margins ([facility])       12/10/2015 -  Anti-estrogen oral therapy    anastrozole 28m daily, which was switched to Tamoxifen on 06/30/2016 due to persistent disease.      01/03/2016 - 03/03/2016  Radiation Therapy    Radiation to left chest wall recurrence (12/06/2015-02/04/2016 - 44 Gy), pt progressed through the radiation, and had additional 10 fractions of radiation (02/21/16-03/03/2016 - total 70 Gy to the left chest wall)        CURRENT THERAPY:  We'll change anastrozole to Tamoxifen in a few days   INTERVAL HISTORY:  [name] for follow up. She is accompanied by her daughter to the clinic today. She actually declined fulvestrant injection 2 weeks ago Due to the concern of side effects and cost. She is still taking anastrozole. She feels well, denies any pain or others symptoms. She saw Dr. TMarlou Starkslately, and has decided to have second surgery for her recurrent chest wall lesions.   PAST MEDICAL HISTORY: Past Medical History:  Diagnosis Date  . Anxiety   . Blood in urine   . CAD (coronary artery disease)    non obstructive by cath  . Cancer ([facility]    breast- left  . Depression   . GERD (gastroesophageal reflux disease)   . Hypertension   . Knee fracture   . TR (tricuspid regurgitation)    mild by Echo 12/2008 EF >55%    PAST SURGICAL HISTORY: Past Surgical History:  Procedure Laterality Date  . ABDOMINAL AORTAGRAM N/A 09/08/2011  Procedure: ABDOMINAL Maxcine Ham;  Surgeon: Troy Sine, MD;  Location: [facility] CATH LAB;  Service: Cardiovascular;  Laterality: N/A;  . ABDOMINAL HYSTERECTOMY    . BREAST SURGERY  08-30-10   mastectomy  . CARDIAC CATHETERIZATION  08/2011   20% LAD stenosis, 20% diagonal stenosis, 10-20% proximal dominant RCA stenosis  . COLON SURGERY    . LEFT HEART CATHETERIZATION WITH CORONARY ANGIOGRAM N/A 09/08/2011   Procedure: LEFT HEART CATHETERIZATION WITH CORONARY ANGIOGRAM;  Surgeon: Troy Sine, MD;  Location: [facility] CATH LAB;  Service: Cardiovascular;  Laterality: N/A;  . MINOR BREAST BIOPSY Left 11/25/2015   Procedure: MINOR EXCISION MASS LEFT CHEST WALL;  Surgeon: Autumn Messing III, MD;  Location: [place];  Service: General;  Laterality:  Left;  . TONSILLECTOMY      FAMILY HISTORY: Family History  Problem Relation Age of Onset  . Cancer Father     prostate  . Cancer Sister     breast  . Heart disease Sister   . Cancer Brother     pancreatic  . Cancer Maternal Grandmother     colon  . Dementia Mother   . Sudden death Brother   . Heart attack Brother   . Heart attack Brother   . Arthritis Sister     SOCIAL HISTORY: Social History  Substance Use Topics  . Smoking status: Former Smoker    Quit date: 11/15/1991  . Smokeless tobacco: Never Used  . Alcohol use 1.2 oz/week    2 Glasses of wine per week     Comment: 1 glass per week    ALLERGIES: Allergies  Allergen Reactions  . Contrast Media [Iodinated Diagnostic Agents] Anaphylaxis  . Penicillins Anaphylaxis and Swelling    Has patient had a PCN reaction causing immediate rash, facial/tongue/throat swelling, SOB or lightheadedness with hypotension: Yes Has patient had a PCN reaction causing severe rash involving mucus membranes or skin necrosis: No Has patient had a PCN reaction that required hospitalization Yes Has patient had a PCN reaction occurring within the last 10 years: No If all of the above answers are "NO", then may proceed with Cephalosporin use.   . Adhesive [Tape] Other (See Comments)    blisters  . Cephalexin Swelling  . Ciprofloxacin Swelling  . Demerol Nausea And Vomiting  . Sulfamethoxazole-Trimethoprim Itching and Swelling    REACTION: swelling/hives  . Quinolones Itching and Rash    REACTION: itching, rash Pt. Reports no problems with levaquin     MEDICATIONS:  Current Outpatient Prescriptions  Medication Sig Dispense Refill  . acetaminophen (TYLENOL) 500 MG tablet Take 1,000 mg by mouth daily as needed for moderate pain. Reported on 03/10/2016    . ALPRAZolam (XANAX) 0.25 MG tablet Take 1/2 tablet prior to radiation and 1 tablet prior to bedtime. 30 tablet 1  . anastrozole (ARIMIDEX) 1 MG tablet Take 1 tablet (1 mg total) by  mouth daily. 30 tablet 3  . aspirin 81 MG chewable tablet Chew 81 mg by mouth once a week. Reported on 03/10/2016    . CALCIUM-MAGNESIUM-VITAMIN D PO Take 1 tablet by mouth 2 (two) times daily.    . cetirizine (ZYRTEC) 10 MG tablet Take 10 mg by mouth as needed for allergies.     Marland Kitchen doxepin (SINEQUAN) 10 MG capsule Take 10 mg by mouth at bedtime.     . fluticasone (FLONASE) 50 MCG/ACT nasal spray Place 1 spray into both nostrils daily as needed for allergies.     Marland Kitchen guaiFENesin (Cascade)  600 MG 12 hr tablet Take 600 mg by mouth daily as needed for cough or to loosen phlegm. Reported on 03/28/2016    . losartan (COZAAR) 25 MG tablet Take 25 mg by mouth daily.   0  . nitroGLYCERIN (NITROSTAT) 0.4 MG SL tablet Place 1 tablet (0.4 mg total) under the tongue every 5 (five) minutes as needed for chest pain. <PLEASE MAKE APPOINTMENT> 25 tablet 3  . potassium chloride (K-DUR) 10 MEQ tablet Take 10 mEq by mouth daily.    . pseudoephedrine-acetaminophen (TYLENOL SINUS) 30-500 MG TABS tablet Take 1 tablet by mouth every 4 (four) hours as needed.    . tamoxifen (NOLVADEX) 20 MG tablet Take 1 tablet (20 mg total) by mouth daily. 30 tablet 3   No current facility-administered medications for this visit.       REVIEW OF SYSTEMS: A 10 point review of systems was completed and is negative except as noted above.   Health Maintenance  Mammogram: 10/19/2015   PHYSICAL EXAMINATION: BP (!) 142/70 (BP Location: Right Arm, Patient Position: Sitting)   Pulse 73   Temp 97.4 F (36.3 C) (Oral)   Resp 18   Ht 5' 1"  (1.549 m)   Wt 126 lb 1.6 oz (57.2 kg)   SpO2 99%   BMI 23.83 kg/m   GENERAL: Patient is a well appearing female in no acute distress HEENT:  Sclerae anicteric.  Oropharynx clear and moist. No ulcerations or evidence of oropharyngeal candidiasis. Neck is supple.  NODES:   There is no cervical , supraclavicular, or axillary lymphadenopathy palpated.  BREAST EXAM:  Left breast is surgically absent,   incision in the left frontal chest has healed well. (+) mild skin  pigmenttio in left frontal chest, secondary to radiation.  There are 2 small nodules  along the incision line, about 5-91m, nontender, suspicious for local recurrence. Right breast and axillas no masses or nodules.   LUNGS:  Clear to auscultation bilaterally.  No wheezes or rhonchi. HEART:  Regular rate and rhythm. No murmur appreciated. ABDOMEN:  Soft, nontender.  Positive, normoactive bowel sounds. No organomegaly palpated. MSK:  No focal spinal tenderness to palpation. Full range of motion bilaterally in the upper extremities. EXTREMITIES:  No peripheral edema.   SKIN:  Clear with no obvious rashes or skin changes. No nail dyscrasia. NEURO:  Nonfocal. Well oriented.  Appropriate affect.  ECOG FS:  Grade 1 - Symptomatic but completely ambulatory   LAB RESULTS: CBC Latest Ref Rng & Units 06/06/2016 05/09/2016 03/09/2016  WBC 3.9 - 10.3 10e3/uL 4.4 4.1 5.2  Hemoglobin 11.6 - 15.9 g/dL 13.7 13.1 11.8(L)  Hematocrit 34.8 - 46.6 % 42.2 39.5 35.7(L)  Platelets 145 - 400 10e3/uL 139(L) 133(L) 136(L)      CMP Latest Ref Rng & Units 06/06/2016 05/09/2016 03/09/2016  Glucose 70 - 140 mg/dl 102 86 111(H)  BUN 7.0 - 26.0 mg/dL 18.0 18.2 32(H)  Creatinine 0.6 - 1.1 mg/dL 0.8 1.0 1.07(H)  Sodium 136 - 145 mEq/L 140 142 142  Potassium 3.5 - 5.1 mEq/L 4.4 4.0 4.5  Chloride 101 - 111 mmol/L - - 110  CO2 22 - 29 mEq/L 25 25 24   Calcium 8.4 - 10.4 mg/dL 9.5 9.0 9.1  Total Protein 6.4 - 8.3 g/dL 6.6 6.4 -  Total Bilirubin 0.20 - 1.20 mg/dL 0.59 0.42 -  Alkaline Phos 40 - 150 U/L 71 78 -  AST 5 - 34 U/L 20 24 -  ALT 0 - 55 U/L 15 16 -  PATHOLOGY REPORT Diagnosis 11/25/2015 Soft tissue mass, simple excision, Left chest wall - INVASIVE LOBULAR CARCINOMA, PRESENT AT NON ORIENTED TISSUE EDGE(S). - SEE COMMENT. Microscopic Comment The carcinoma appears grade 2. A breast prognostic profile will be performed and the results reported  separately. (JDP:gt, 11/26/15).  Results: IMMUNOHISTOCHEMICAL AND MORPHOMETRIC ANALYSIS PERFORMED MANUALLY Estrogen Receptor: 95%, POSITIVE, STRONG STAINING INTENSITY Progesterone Receptor: 60%, POSITIVE, STRONG STAINING INTENSITY Results: HER2 - NEGATIVE RATIO OF HER2/CEP17 SIGNALS 1.07 AVERAGE HER2 COPY NUMBER PER CELL 2.90   RADIOGRAPHIC STUDIES: CT chest, abdomen and pelvis wo contrast 12/23/2015 IMPRESSION: 8 mm soft tissue nodule in the left chest wall mastectomy bed, likely representing the site of known left breast cancer recurrence.  No evidence of metastatic disease within the chest, abdomen, or Pelvis.  ADDENDUM: This case was subsequently discussed with [name] by telephone. The recurrent breast carcinoma in the left chest wall mastectomy bed has already been surgically resected. Therefore the small nodular density described in the impression below most likely represents postop change. Recommend continued attention on follow-up imaging, either with CT or breast ultrasound.  Bone scan 12/23/2015 IMPRESSION: No evidence of metastatic disease. Findings suggesting degenerative change about the right sternoclavicular joint, the lower lumbar sacral region, and both knees. Specifically no focal rib lesion identified.  Ultrasound of left chest wall and axilla 05/24/2016 IMPRESSION: Two small subdermal nodules which may represent small chest wall recurrences located underlying the midportion of the left mastectomy incision as discussed above.  ASSESSMENT/PLAN: 82 y.o. with:  1. Stage IIB, T2 N1 invasive lobular carcinoma, grade 2, ER 95%, PR 97%, Ki-67 17%, HER-2/neu no amplification, diagnosed in 07/2010, local chest wall recurrence in 09/2015   -She was on adjuvant tamoxifen for 4.5 years, stopped on her own. -She unfortunately developed local recurrence 8 months after she stopped tamoxifen. -I previously reviewed her surgical pathology findings, which is  consistent with lobular carcinoma recurrence from her previous breast cancer. -The tumor was surgically removed, with positive margins. -Her restaging CT scan and bone scan in 12/2015 was negative for distant metastasis. -She has completed chest wall radiation, however developed 2-3 small during the radiation and anastrozole therapy, supicious for residual cancer. US showed a 4 mm and 6 mm subcutaneous nodule, partially calcified, highly suspicious for local recurrence. -I'll contact Dr. Marlou Starks to see if he would offer second surgery to resect those local recurrence.  -She has been on anastrozole for 6 months, and the chest wall nodule developed during this period. I recommend her to switch to fulvestrant injection.She declined due to the concern of side effects and cost. -I recommend her to switch to tamoxifen, for which she took before. She is agreeable. I call in to her pharmacy today, she will switched in the next few days -She has decided to have second surgery for her chest wall recurrence, which is scheduled for September 18. I recommend her to have a restaging PET or CT and bone scan to ruled out distant metastasis before surgery.   2. HTN, CAD  -She'll continue follow-up with her primary care physician, she also has appointment with her cardiologist on May 11  Plan -change anastrozole to tamoxifen in a few days  -surgery on 9/18 -PET in 2 weeks, will call her daughter if it's approved. Her daughter will check with her insurance company to see if she has high co-pay, in which case we will switch to CT (without contrast due to allergy) and bone scan -If her restaging scan showed no evidence of  distant recurrence, I'll call her or her daughter to review the results. Then I plan to see her back in October for follow-up. I'll see her soon if her scan is abnormal.  I spent 25 minutes counseling the patient face to face.  The total time spent in the appointment was 30 minutes.  Truitt Merle   06/27/2016

## 2016-06-29 NOTE — Addendum Note (Signed)
Addended by: Truitt Merle on: 06/29/2016 07:42 PM   Modules accepted: Orders

## 2016-07-11 ENCOUNTER — Other Ambulatory Visit (HOSPITAL_BASED_OUTPATIENT_CLINIC_OR_DEPARTMENT_OTHER): Payer: PPO

## 2016-07-11 DIAGNOSIS — C50912 Malignant neoplasm of unspecified site of left female breast: Secondary | ICD-10-CM

## 2016-07-11 DIAGNOSIS — H43392 Other vitreous opacities, left eye: Secondary | ICD-10-CM | POA: Diagnosis not present

## 2016-07-11 DIAGNOSIS — H35043 Retinal micro-aneurysms, unspecified, bilateral: Secondary | ICD-10-CM | POA: Diagnosis not present

## 2016-07-11 DIAGNOSIS — H348322 Tributary (branch) retinal vein occlusion, left eye, stable: Secondary | ICD-10-CM | POA: Diagnosis not present

## 2016-07-11 DIAGNOSIS — H3562 Retinal hemorrhage, left eye: Secondary | ICD-10-CM | POA: Diagnosis not present

## 2016-07-11 LAB — CBC WITH DIFFERENTIAL/PLATELET
BASO%: 1.2 % (ref 0.0–2.0)
BASOS ABS: 0 10*3/uL (ref 0.0–0.1)
EOS ABS: 0.2 10*3/uL (ref 0.0–0.5)
EOS%: 6.3 % (ref 0.0–7.0)
HCT: 40.9 % (ref 34.8–46.6)
HGB: 13.6 g/dL (ref 11.6–15.9)
LYMPH%: 12.3 % — AB (ref 14.0–49.7)
MCH: 30.6 pg (ref 25.1–34.0)
MCHC: 33.2 g/dL (ref 31.5–36.0)
MCV: 92 fL (ref 79.5–101.0)
MONO#: 0.3 10*3/uL (ref 0.1–0.9)
MONO%: 7 % (ref 0.0–14.0)
NEUT#: 2.9 10*3/uL (ref 1.5–6.5)
NEUT%: 73.2 % (ref 38.4–76.8)
PLATELETS: 128 10*3/uL — AB (ref 145–400)
RBC: 4.44 10*6/uL (ref 3.70–5.45)
RDW: 13.1 % (ref 11.2–14.5)
WBC: 4 10*3/uL (ref 3.9–10.3)
lymph#: 0.5 10*3/uL — ABNORMAL LOW (ref 0.9–3.3)

## 2016-07-11 LAB — COMPREHENSIVE METABOLIC PANEL
ALK PHOS: 67 U/L (ref 40–150)
ALT: 14 U/L (ref 0–55)
ANION GAP: 9 meq/L (ref 3–11)
AST: 20 U/L (ref 5–34)
Albumin: 3.6 g/dL (ref 3.5–5.0)
BILIRUBIN TOTAL: 0.73 mg/dL (ref 0.20–1.20)
BUN: 15.9 mg/dL (ref 7.0–26.0)
CO2: 26 meq/L (ref 22–29)
Calcium: 9.4 mg/dL (ref 8.4–10.4)
Chloride: 108 mEq/L (ref 98–109)
Creatinine: 0.9 mg/dL (ref 0.6–1.1)
EGFR: 56 mL/min/{1.73_m2} — AB (ref >90)
GLUCOSE: 67 mg/dL — AB (ref 70–140)
POTASSIUM: 4.1 meq/L (ref 3.5–5.1)
SODIUM: 144 meq/L (ref 136–145)
Total Protein: 6.4 g/dL (ref 6.4–8.3)

## 2016-07-12 ENCOUNTER — Other Ambulatory Visit (HOSPITAL_COMMUNITY): Payer: PPO

## 2016-07-13 ENCOUNTER — Ambulatory Visit: Payer: Self-pay | Admitting: Radiation Oncology

## 2016-07-14 ENCOUNTER — Ambulatory Visit (HOSPITAL_COMMUNITY)
Admission: RE | Admit: 2016-07-14 | Discharge: 2016-07-14 | Disposition: A | Discharge status: Home / Self Care | Payer: PPO | Source: Ambulatory Visit | Attending: Hematology | Admitting: Hematology

## 2016-07-14 DIAGNOSIS — J8410 Pulmonary fibrosis, unspecified: Secondary | ICD-10-CM | POA: Insufficient documentation

## 2016-07-14 DIAGNOSIS — C50112 Malignant neoplasm of central portion of left female breast: Secondary | ICD-10-CM | POA: Diagnosis not present

## 2016-07-14 DIAGNOSIS — C50919 Malignant neoplasm of unspecified site of unspecified female breast: Secondary | ICD-10-CM | POA: Diagnosis not present

## 2016-07-14 DIAGNOSIS — N8189 Other female genital prolapse: Secondary | ICD-10-CM | POA: Diagnosis not present

## 2016-07-14 LAB — GLUCOSE, CAPILLARY: GLUCOSE-CAPILLARY: 102 mg/dL — AB (ref 65–99)

## 2016-07-14 MED ORDER — FLUDEOXYGLUCOSE F - 18 (FDG) INJECTION: 9.6000 | Freq: Once | INTRAVENOUS | Status: DC | PRN

## 2016-07-17 ENCOUNTER — Ambulatory Visit (HOSPITAL_BASED_OUTPATIENT_CLINIC_OR_DEPARTMENT_OTHER)
Admission: RE | Admit: 2016-07-17 | Discharge: 2016-07-17 | Disposition: A | Discharge status: Home / Self Care | Payer: PPO | Source: Ambulatory Visit | Attending: General Surgery | Admitting: General Surgery

## 2016-07-17 ENCOUNTER — Encounter (HOSPITAL_BASED_OUTPATIENT_CLINIC_OR_DEPARTMENT_OTHER): Payer: Self-pay | Admitting: Anesthesiology

## 2016-07-17 ENCOUNTER — Encounter (HOSPITAL_BASED_OUTPATIENT_CLINIC_OR_DEPARTMENT_OTHER): Payer: Self-pay

## 2016-07-17 ENCOUNTER — Encounter (HOSPITAL_BASED_OUTPATIENT_CLINIC_OR_DEPARTMENT_OTHER)
Admission: RE | Disposition: A | Discharge status: Home / Self Care | Payer: Self-pay | Source: Ambulatory Visit | Attending: General Surgery

## 2016-07-17 DIAGNOSIS — C50912 Malignant neoplasm of unspecified site of left female breast: Secondary | ICD-10-CM | POA: Insufficient documentation

## 2016-07-17 DIAGNOSIS — Z21 Asymptomatic human immunodeficiency virus [HIV] infection status: Secondary | ICD-10-CM | POA: Insufficient documentation

## 2016-07-17 DIAGNOSIS — Z880 Allergy status to penicillin: Secondary | ICD-10-CM | POA: Diagnosis not present

## 2016-07-17 DIAGNOSIS — C493 Malignant neoplasm of connective and soft tissue of thorax: Secondary | ICD-10-CM | POA: Diagnosis not present

## 2016-07-17 DIAGNOSIS — Z9012 Acquired absence of left breast and nipple: Secondary | ICD-10-CM | POA: Insufficient documentation

## 2016-07-17 DIAGNOSIS — Z90710 Acquired absence of both cervix and uterus: Secondary | ICD-10-CM | POA: Insufficient documentation

## 2016-07-17 DIAGNOSIS — Z7982 Long term (current) use of aspirin: Secondary | ICD-10-CM | POA: Insufficient documentation

## 2016-07-17 DIAGNOSIS — Z853 Personal history of malignant neoplasm of breast: Secondary | ICD-10-CM | POA: Insufficient documentation

## 2016-07-17 DIAGNOSIS — I10 Essential (primary) hypertension: Secondary | ICD-10-CM | POA: Diagnosis not present

## 2016-07-17 DIAGNOSIS — E7800 Pure hypercholesterolemia, unspecified: Secondary | ICD-10-CM | POA: Diagnosis not present

## 2016-07-17 DIAGNOSIS — Z170 Estrogen receptor positive status [ER+]: Secondary | ICD-10-CM | POA: Diagnosis not present

## 2016-07-17 HISTORY — PX: MINOR BREAST BIOPSY: SHX5977

## 2016-07-17 SURGERY — MINOR BREAST BIOPSY
Anesthesia: LOCAL | Site: Chest | Laterality: Left

## 2016-07-17 MED ORDER — CHLORHEXIDINE GLUCONATE CLOTH 2 % EX PADS: 6.0000 | MEDICATED_PAD | Freq: Once | CUTANEOUS | Status: DC

## 2016-07-17 MED ORDER — ACETAMINOPHEN 500 MG PO TABS
1000.0000 mg | ORAL_TABLET | Freq: Once | ORAL | Status: AC
  Administered 2016-07-17: 1000 mg via ORAL

## 2016-07-17 MED ORDER — LIDOCAINE HCL (PF) 1 % IJ SOLN
INTRAMUSCULAR | Status: AC
  Filled 2016-07-17: qty 30

## 2016-07-17 MED ORDER — LIDOCAINE HCL (PF) 1 % IJ SOLN
INTRAMUSCULAR | Status: DC | PRN
  Administered 2016-07-17: 11 mL

## 2016-07-17 MED ORDER — ACETAMINOPHEN 500 MG PO TABS
ORAL_TABLET | ORAL | Status: AC
  Filled 2016-07-17: qty 2

## 2016-07-17 MED ORDER — BUPIVACAINE-EPINEPHRINE (PF) 0.25% -1:200000 IJ SOLN
INTRAMUSCULAR | Status: AC
  Filled 2016-07-17: qty 30

## 2016-07-17 MED ORDER — LIDOCAINE-EPINEPHRINE 1 %-1:100000 IJ SOLN
INTRAMUSCULAR | Status: AC
  Filled 2016-07-17: qty 1

## 2016-07-17 MED ORDER — BUPIVACAINE HCL (PF) 0.25 % IJ SOLN
INTRAMUSCULAR | Status: AC
  Filled 2016-07-17: qty 30

## 2016-07-17 MED ORDER — LIDOCAINE-EPINEPHRINE (PF) 1 %-1:200000 IJ SOLN
INTRAMUSCULAR | Status: AC
  Filled 2016-07-17: qty 30

## 2016-07-17 MED ORDER — HYDROCODONE-ACETAMINOPHEN 5-325 MG PO TABS: 1.0000 | ORAL_TABLET | ORAL | 0 refills | Status: DC | PRN

## 2016-07-17 SURGICAL SUPPLY — 22 items
BLADE SURG 15 STRL LF DISP TIS (BLADE) ×1 IMPLANT
BLADE SURG 15 STRL SS (BLADE) ×2
CHLORAPREP W/TINT 26ML (MISCELLANEOUS) ×2 IMPLANT
DECANTER SPIKE VIAL GLASS SM (MISCELLANEOUS) IMPLANT
DRAPE LAPAROSCOPIC ABDOMINAL (DRAPES) ×1 IMPLANT
DRAPE UTILITY XL STRL (DRAPES) ×2 IMPLANT
GAUZE SPONGE 4X4 16PLY XRAY LF (GAUZE/BANDAGES/DRESSINGS) ×2 IMPLANT
GLOVE BIO SURGEON STRL SZ7.5 (GLOVE) ×2 IMPLANT
GLOVE SURG SS PI 7.5 STRL IVOR (GLOVE) ×3 IMPLANT
GOWN STRL REUS W/ TWL LRG LVL3 (GOWN DISPOSABLE) ×2 IMPLANT
GOWN STRL REUS W/TWL LRG LVL3 (GOWN DISPOSABLE) ×4
LIQUID BAND (GAUZE/BANDAGES/DRESSINGS) ×2 IMPLANT
MARKER SKIN DUAL TIP RULER LAB (MISCELLANEOUS) ×2 IMPLANT
NDL HYPO 25X1 1.5 SAFETY (NEEDLE) ×1 IMPLANT
NDL SAFETY ECLIPSE 18X1.5 (NEEDLE) ×1 IMPLANT
NEEDLE HYPO 18GX1.5 SHARP (NEEDLE)
NEEDLE HYPO 25X1 1.5 SAFETY (NEEDLE) ×2 IMPLANT
SUT MON AB 4-0 PC3 18 (SUTURE) ×2 IMPLANT
SUT VIC AB 3-0 SH 27 (SUTURE)
SUT VIC AB 3-0 SH 27X BRD (SUTURE) ×1 IMPLANT
SYR CONTROL 10ML LL (SYRINGE) ×2 IMPLANT
TOWEL OR NON WOVEN STRL DISP B (DISPOSABLE) ×2 IMPLANT

## 2016-07-17 NOTE — H&P (Signed)
Gloria [name]  Location: [facility] Surgery Patient #: [id number] DOB: 1928-01-09 Divorced / Language: Cleophus Molt / Race: White Female   History of Present Illness Patient words: chest wall nodule.  The patient is a 82 year old female who presents for a follow-up for Breast cancer. The patient is an 82 year old white female who is almost 6 years status post left mastectomy and sentinel node mapping for a T2 N1 left breast cancer. She was ER/PR positive and HER-2 negative with a Ki-67 of 17%. Several months ago she had a nodule removed from the left chest wall which was diagnosed as a recurrence. Her only evidence of disease is 2 small nodules along the mastectomy incision on the left chest wall. The patient is very afraid of taking any different sorts of medicines to treat this. Her oncologist has asked [place] to remove these nodules and I think this is a reasonable thing to do.   Problem List/Past Medical  PRIMARY CANCER OF LEFT FEMALE BREAST (C50.912)  Other Problems  Bladder Problems Diverticulosis Hepatitis High blood pressure HIV-positive Hypercholesterolemia Oophorectomy Left. Transfusion history Umbilical Hernia Repair  Past Surgical History  Breast Biopsy Left. Cataract Surgery Left. Colon Polyp Removal - Open Hysterectomy (due to cancer) - Complete Hysterectomy (not due to cancer) - Complete Mastectomy Left. Oral Surgery Tonsillectomy  Diagnostic Studies History Colonoscopy 5-10 years ago Mammogram 1-3 years ago  Allergies  Penicillins Sulfa Drugs Adhesive Tape Iodinated Contrast Media Cephalexin *CEPHALOSPORINS* Ciprofloxacin *FLUOROQUINOLONES* Demerol *ANALGESICS - OPIOID* Quinolones Keflex *CEPHALOSPORINS*  Medication History ALPRAZolam (0.25MG Tablet, Oral) Active. Aspirin (81MG Tablet, Oral) Active. Calcium-Vitamin D (600MG Tablet Chewable, Oral) Active. Doxepin HCl (10MG Capsule, Oral) Active. Flonase  (50MCG/ACT Suspension, Nasal) Active. Mucinex (600MG Tablet ER, Oral) Active. Losartan Potassium-HCTZ (50-12.5MG Tablet, Oral) Active. ZyrTEC Allergy (10MG Capsule, Oral) Active. Tylenol (325MG Tablet, Oral) Active. Fulvestrant (125MG/2.5ML Solution, Intramuscular) Active. Medications Reconciled  Social History Alcohol use Occasional alcohol use. Caffeine use Coffee.  Family History  Alcohol Abuse Brother. Arthritis Brother, Father. Breast Cancer Sister. Colon Cancer Brother, Family Members In General. Depression Sister. Heart Disease Father. Hypertension Father. Kidney Disease Father. Malignant Neoplasm Of Pancreas Brother. Melanoma Sister. Thyroid problems Sister.  Pregnancy / Birth History  Age of menopause 51-50 Contraceptive History Oral contraceptives. Gravida 2 Maternal age 5-30 Para 2    Review of Systems  General Present- Chills. Not Present- Appetite Loss, Fatigue, Fever, Night Sweats, Weight Gain and Weight Loss. HEENT Present- Hearing Loss, Nose Bleed, Seasonal Allergies and Sinus Pain. Not Present- Earache, Hoarseness, Oral Ulcers, Ringing in the Ears, Sore Throat, Visual Disturbances, Wears glasses/contact lenses and Yellow Eyes. Breast Present- Breast Pain and Skin Changes. Not Present- Breast Mass and Nipple Discharge. Cardiovascular Present- Chest Pain and Leg Cramps. Not Present- Difficulty Breathing Lying Down, Palpitations, Rapid Heart Rate, Shortness of Breath and Swelling of Extremities. Gastrointestinal Present- Difficulty Swallowing and Hemorrhoids. Not Present- Abdominal Pain, Bloating, Bloody Stool, Change in Bowel Habits, Chronic diarrhea, Constipation, Excessive gas, Gets full quickly at meals, Indigestion, Nausea, Rectal Pain and Vomiting. Female Genitourinary Present- Nocturia. Not Present- Frequency, Painful Urination, Pelvic Pain and Urgency. Musculoskeletal Present- Back Pain and Muscle Pain. Not Present- Joint Pain,  Joint Stiffness, Muscle Weakness and Swelling of Extremities. Neurological Present- Numbness. Not Present- Decreased Memory, Fainting, Headaches, Seizures, Tingling, Tremor, Trouble walking and Weakness. Psychiatric Present- Anxiety. Not Present- Bipolar, Change in Sleep Pattern, Depression, Fearful and Frequent crying.  Vitals  Weight: 123.13 lb Height: 60in Body Surface Area: 1.52 m Body Mass Index: 24.05  kg/m  BP: 112/74 (Sitting, Left Arm, Standard)       Physical Exam  General Mental Status-Alert. General Appearance-Consistent with stated age. Hydration-Well hydrated. Voice-Normal.  Head and Neck Head-normocephalic, atraumatic with no lesions or palpable masses. Trachea-midline. Thyroid Gland Characteristics - normal size and consistency.  Eye Eyeball - Bilateral-Extraocular movements intact. Sclera/Conjunctiva - Bilateral-No scleral icterus.  Chest and Lung Exam Chest and lung exam reveals -quiet, even and easy respiratory effort with no use of accessory muscles and on auscultation, normal breath sounds, no adventitious sounds and normal vocal resonance. Inspection Chest Wall - Normal. Back - normal.  Breast Note: There are 2 small nodules along the midportion of the mastectomy incision on the left chest wall. The medial one measures about 4 mm in the lateral one measures about 2 mm. There is no other palpable mass of the left chest wall. There is no palpable mass of the right breast. There is no palpable axillary, supraclavicular, or cervical lymphadenopathy.   Cardiovascular Cardiovascular examination reveals -normal heart sounds, regular rate and rhythm with no murmurs and normal pedal pulses bilaterally.  Abdomen Inspection Inspection of the abdomen reveals - No Hernias. Skin - Scar - no surgical scars. Palpation/Percussion Palpation and Percussion of the abdomen reveal - Soft, Non Tender, No Rebound tenderness, No Rigidity  (guarding) and No hepatosplenomegaly. Auscultation Auscultation of the abdomen reveals - Bowel sounds normal.  Neurologic Neurologic evaluation reveals -alert and oriented x 3 with no impairment of recent or remote memory. Mental Status-Normal.  Musculoskeletal Normal Exam - Left-Upper Extremity Strength Normal and Lower Extremity Strength Normal. Normal Exam - Right-Upper Extremity Strength Normal and Lower Extremity Strength Normal.  Lymphatic Head & Neck  General Head & Neck Lymphatics: Bilateral - Description - Normal. Axillary  General Axillary Region: Bilateral - Description - Normal. Tenderness - Non Tender. Femoral & Inguinal  Generalized Femoral & Inguinal Lymphatics: Bilateral - Description - Normal. Tenderness - Non Tender.    Assessment & Plan  PRIMARY CANCER OF LEFT FEMALE BREAST (C50.912) Impression: The patient is almost 6 years status post left mastectomy for breast cancer. She recently had a nodule biopsied on the left chest wall which was positive for recurrence of her cancer. She currently has only 2 small subcentimeter nodules along the left chest wall. Her oncologist would like us to remove them and I think this would be a reasonable thing to do. I have discussed with her in detail the risks and benefits of the operation to do this as well as some of the technical aspects and she understands and wishes to proceed. I think we can do this with just local numbing medicine.    

## 2016-07-17 NOTE — Interval H&P Note (Signed)
History and Physical Interval Note:  07/17/2016 8:36 AM  Gloria [name]  has presented today for surgery, with the diagnosis of RECURRENT LEFT BREAST CANCER  The various methods of treatment have been discussed with the patient and family. After consideration of risks, benefits and other options for treatment, the patient has consented to  Procedure(s): EXCISION OF 2 LEFT CHEST WALL MASSES (Left) as a surgical intervention .  The patient's history has been reviewed, patient examined, no change in status, stable for surgery.  I have reviewed the patient's chart and labs.  Questions were answered to the patient's satisfaction.     TOTH III,Torina Ey S

## 2016-07-17 NOTE — Op Note (Addendum)
07/17/2016  9:30 AM  PATIENT:  Gloria [name]  82 y.o. female  PRE-OPERATIVE DIAGNOSIS:  RECURRENT LEFT BREAST CANCER  POST-OPERATIVE DIAGNOSIS:  RECURRENT LEFT BREAST CANCER  PROCEDURE:  Procedure(s): EXCISION OF 2 LEFT CHEST WALL MASSES (Left)  SURGEON:  Surgeon(s) and Role:    * Jovita Kussmaul, MD - Primary  PHYSICIAN ASSISTANT:   ASSISTANTS: none   ANESTHESIA:   local  EBL:  No intake/output data recorded.  BLOOD ADMINISTERED:none  DRAINS: none   LOCAL MEDICATIONS USED:  LIDOCAINE   SPECIMEN:  Source of Specimen:  medial and lateral chest wall masses  DISPOSITION OF SPECIMEN:  PATHOLOGY  COUNTS:  YES  TOURNIQUET:  * No tourniquets in log *  DICTATION: .Dragon Dictation   After informed consent was obtained the patient was brought to the operating room and placed in the supine position on the operating room table. The left chest wall was prepped with ChloraPrep, allowed to dry, and draped in usual sterile manner. An appropriate timeout was performed. The patient has a history of left breast cancer and has 2 small nodules along the mastectomy incision. The area around each nodule was infiltrated with 1% lidocaine with epinephrine until a good field block was created. A small elliptical incision was then made with a 15 blade knife overlying each nodule. The incision was carried through the skin and subcutaneous tissue sharply until each nodule was completely excised. The medial mess measured about 41mm and the lateral mass measured about 21mm. These specimens were sent as medial and lateral chest wall masses. Each incision was then closed with interrupted 4-0 Monocryl subcuticular stitches. Dermabond dressings were applied. The patient tolerated the procedure well. At the end of the case all needle sponge and instrument counts were correct. The patient was then awakened and taken to recovery in stable condition.  PLAN OF CARE: Discharge to home after PACU  PATIENT DISPOSITION:   PACU - hemodynamically stable.   Delay start of Pharmacological VTE agent (>24hrs) due to surgical blood loss or risk of bleeding: not applicable

## 2016-07-18 ENCOUNTER — Encounter (HOSPITAL_BASED_OUTPATIENT_CLINIC_OR_DEPARTMENT_OTHER): Payer: Self-pay | Admitting: General Surgery

## 2016-07-18 ENCOUNTER — Ambulatory Visit: Payer: PPO | Admitting: Radiation Oncology

## 2016-07-25 ENCOUNTER — Other Ambulatory Visit (HOSPITAL_COMMUNITY): Payer: PPO

## 2016-07-27 ENCOUNTER — Emergency Department (HOSPITAL_COMMUNITY)
Admission: EM | Admit: 2016-07-27 | Discharge: 2016-07-27 | Disposition: A | Discharge status: Home / Self Care | Payer: PPO | Attending: Emergency Medicine | Admitting: Emergency Medicine

## 2016-07-27 ENCOUNTER — Encounter (HOSPITAL_COMMUNITY): Payer: Self-pay | Admitting: Emergency Medicine

## 2016-07-27 ENCOUNTER — Emergency Department (HOSPITAL_COMMUNITY): Payer: PPO

## 2016-07-27 DIAGNOSIS — J45909 Unspecified asthma, uncomplicated: Secondary | ICD-10-CM | POA: Diagnosis not present

## 2016-07-27 DIAGNOSIS — Z853 Personal history of malignant neoplasm of breast: Secondary | ICD-10-CM | POA: Diagnosis not present

## 2016-07-27 DIAGNOSIS — R1032 Left lower quadrant pain: Secondary | ICD-10-CM | POA: Diagnosis not present

## 2016-07-27 DIAGNOSIS — Z87891 Personal history of nicotine dependence: Secondary | ICD-10-CM | POA: Diagnosis not present

## 2016-07-27 DIAGNOSIS — I2510 Atherosclerotic heart disease of native coronary artery without angina pectoris: Secondary | ICD-10-CM | POA: Diagnosis not present

## 2016-07-27 DIAGNOSIS — Z7982 Long term (current) use of aspirin: Secondary | ICD-10-CM | POA: Diagnosis not present

## 2016-07-27 DIAGNOSIS — I10 Essential (primary) hypertension: Secondary | ICD-10-CM | POA: Diagnosis not present

## 2016-07-27 DIAGNOSIS — K5900 Constipation, unspecified: Secondary | ICD-10-CM | POA: Diagnosis not present

## 2016-07-27 DIAGNOSIS — K5730 Diverticulosis of large intestine without perforation or abscess without bleeding: Secondary | ICD-10-CM | POA: Diagnosis not present

## 2016-07-27 LAB — COMPREHENSIVE METABOLIC PANEL
ALBUMIN: 4.1 g/dL (ref 3.5–5.0)
ALK PHOS: 47 U/L (ref 38–126)
ALT: 12 U/L — AB (ref 14–54)
ANION GAP: 8 (ref 5–15)
AST: 23 U/L (ref 15–41)
BILIRUBIN TOTAL: 0.8 mg/dL (ref 0.3–1.2)
BUN: 14 mg/dL (ref 6–20)
CALCIUM: 9.4 mg/dL (ref 8.9–10.3)
CO2: 27 mmol/L (ref 22–32)
CREATININE: 0.95 mg/dL (ref 0.44–1.00)
Chloride: 108 mmol/L (ref 101–111)
GFR calc Af Amer: >60 mL/min (ref >60)
GFR calc non Af Amer: 53 mL/min — ABNORMAL LOW (ref >60)
GLUCOSE: 95 mg/dL (ref 65–99)
Potassium: 4.2 mmol/L (ref 3.5–5.1)
SODIUM: 143 mmol/L (ref 135–145)
TOTAL PROTEIN: 6.5 g/dL (ref 6.5–8.1)

## 2016-07-27 LAB — URINE MICROSCOPIC-ADD ON

## 2016-07-27 LAB — CBC
HCT: 42.5 % (ref 36.0–46.0)
HEMOGLOBIN: 13.6 g/dL (ref 12.0–15.0)
MCH: 30.5 pg (ref 26.0–34.0)
MCHC: 32 g/dL (ref 30.0–36.0)
MCV: 95.3 fL (ref 78.0–100.0)
PLATELETS: 124 10*3/uL — AB (ref 150–400)
RBC: 4.46 MIL/uL (ref 3.87–5.11)
RDW: 13.4 % (ref 11.5–15.5)
WBC: 4 10*3/uL (ref 4.0–10.5)

## 2016-07-27 LAB — I-STAT CG4 LACTIC ACID, ED: Lactic Acid, Venous: 1.07 mmol/L (ref 0.5–1.9)

## 2016-07-27 LAB — URINALYSIS, ROUTINE W REFLEX MICROSCOPIC
BILIRUBIN URINE: NEGATIVE
GLUCOSE, UA: NEGATIVE mg/dL
Hgb urine dipstick: NEGATIVE
KETONES UR: 15 mg/dL — AB
NITRITE: NEGATIVE
PH: 5.5 (ref 5.0–8.0)
PROTEIN: NEGATIVE mg/dL
Specific Gravity, Urine: 1.018 (ref 1.005–1.030)

## 2016-07-27 LAB — LIPASE, BLOOD: Lipase: 28 U/L (ref 11–51)

## 2016-07-27 MED ORDER — SODIUM CHLORIDE 0.9 % IV BOLUS (SEPSIS)
1000.0000 mL | Freq: Once | INTRAVENOUS | Status: AC
  Administered 2016-07-27: 1000 mL via INTRAVENOUS

## 2016-07-27 MED ORDER — POLYETHYLENE GLYCOL 3350 17 G PO PACK: PACK | ORAL | 0 refills | Status: DC

## 2016-07-27 MED ORDER — BARIUM SULFATE 2.1 % PO SUSP
ORAL | Status: AC
  Filled 2016-07-27: qty 2

## 2016-07-27 NOTE — ED Triage Notes (Signed)
Onset today developed 0600 lower abdominal pain. Denies nausea emesis urinary or bowel complaints. Seen primary Doctor today sent to ED for evaluation.

## 2016-07-27 NOTE — ED Provider Notes (Signed)
[facility] DEPT Provider Note  CSN: [id number] Arrival Date & Time: 07/27/16 @ [id number]  History    Chief Complaint Chief Complaint  Patient presents with  . Groin Pain   HPI Gloria [name] is a 82 y.o. female.  Patient presents to the emergency department for assessment of lower abdominal pain which developed around 6 AM.  Located in lower abdominal quadrants.  Patient denies nausea or emesis.  Denies any urinary discomfort or other urinary symptoms and endorses is passing flatus and having bowel movements along with no diarrhea.  Patient was seen by primary care doctor today who is concerned for patient abdominal pain and suspicious for hernia.  Patient denies fevers chills chest pain or shortness of breath. Previous surgical history of hysterectomy and other colon surgery. Endorses pain 5/10.  Past Medical & Surgical History    Past Medical History:  Diagnosis Date  . Anxiety   . Blood in urine   . CAD (coronary artery disease)    non obstructive by cath  . Cancer [facility])    breast- left  . Depression   . GERD (gastroesophageal reflux disease)   . Hypertension   . Knee fracture   . TR (tricuspid regurgitation)    mild by Echo 12/2008 EF >55%   Patient Active Problem List   Diagnosis Date Noted  . Chest pain 03/15/2016  . Cancer of central portion of left female breast ([place]) 12/09/2015  . Bronchitis, chronic obstructive w acute bronchitis ([place]) 03/16/2014  . Bronchitis with bronchospasm 03/15/2014  . CAD (coronary artery disease), non obstructive  05/06/2013  . HYPERTENSION, BENIGN 12/23/2008  . HEMORRHOIDS 03/13/2008  . OTHER DYSPHAGIA 03/13/2008  . Asthma, intrinsic 10/30/2007  . G E R D 09/19/2007   Past Surgical History:  Procedure Laterality Date  . ABDOMINAL AORTAGRAM N/A 09/08/2011   Procedure: ABDOMINAL Maxcine Ham;  Surgeon: Troy Sine, MD;  Location: [facility] CATH LAB;  Service: Cardiovascular;  Laterality: N/A;  . ABDOMINAL HYSTERECTOMY    . BREAST SURGERY   08-30-10   mastectomy  . CARDIAC CATHETERIZATION  08/2011   20% LAD stenosis, 20% diagonal stenosis, 10-20% proximal dominant RCA stenosis  . COLON SURGERY    . LEFT HEART CATHETERIZATION WITH CORONARY ANGIOGRAM N/A 09/08/2011   Procedure: LEFT HEART CATHETERIZATION WITH CORONARY ANGIOGRAM;  Surgeon: Troy Sine, MD;  Location: [facility] CATH LAB;  Service: Cardiovascular;  Laterality: N/A;  . MINOR BREAST BIOPSY Left 11/25/2015   Procedure: MINOR EXCISION MASS LEFT CHEST WALL;  Surgeon: Autumn Messing III, MD;  Location: [facility];  Service: General;  Laterality: Left;  . MINOR BREAST BIOPSY Left 07/17/2016   Procedure: EXCISION OF 2 LEFT CHEST WALL MASSES;  Surgeon: Autumn Messing III, MD;  Location: [place];  Service: General;  Laterality: Left;  . TONSILLECTOMY      Family & Social History    Family History  Problem Relation Age of Onset  . Cancer Father     prostate  . Cancer Sister     breast  . Heart disease Sister   . Cancer Brother     pancreatic  . Cancer Maternal Grandmother     colon  . Dementia Mother   . Sudden death Brother   . Heart attack Brother   . Heart attack Brother   . Arthritis Sister    Social History  Substance Use Topics  . Smoking status: Former Smoker    Quit date: 11/15/1991  . Smokeless tobacco: Never  Used  . Alcohol use 1.2 oz/week    2 Glasses of wine per week     Comment: 1 glass per week    Home Medications    Prior to Admission medications   Medication Sig Start Date End Date Taking? Authorizing Provider  acetaminophen (TYLENOL) 500 MG tablet Take 1,000 mg by mouth daily as needed for moderate pain. Reported on 03/10/2016    Historical Provider, MD  ALPRAZolam Duanne Moron) 0.25 MG tablet Take 1/2 tablet prior to radiation and 1 tablet prior to bedtime. 01/25/16   [name], MD  anastrozole (ARIMIDEX) 1 MG tablet Take 1 tablet (1 mg total) by mouth daily. 12/09/15   Truitt Merle, MD  aspirin 81 MG chewable tablet Chew 81 mg  by mouth once a week. Reported on 03/10/2016    Historical Provider, MD  CALCIUM-MAGNESIUM-VITAMIN D PO Take 1 tablet by mouth 2 (two) times daily.    Historical Provider, MD  cetirizine (ZYRTEC) 10 MG tablet Take 10 mg by mouth as needed for allergies.     Historical Provider, MD  doxepin (SINEQUAN) 10 MG capsule Take 10 mg by mouth at bedtime.     Historical Provider, MD  fluticasone (FLONASE) 50 MCG/ACT nasal spray Place 1 spray into both nostrils daily as needed for allergies.     Historical Provider, MD  guaiFENesin (MUCINEX) 600 MG 12 hr tablet Take 600 mg by mouth daily as needed for cough or to loosen phlegm. Reported on 03/28/2016    Historical Provider, MD  HYDROcodone-acetaminophen (NORCO/VICODIN) 5-325 MG tablet Take 1-2 tablets by mouth every 4 (four) hours as needed for moderate pain or severe pain. 07/17/16   Autumn Messing III, MD  losartan (COZAAR) 25 MG tablet Take 25 mg by mouth daily.  02/05/15   Historical Provider, MD  nitroGLYCERIN (NITROSTAT) 0.4 MG SL tablet Place 1 tablet (0.4 mg total) under the tongue every 5 (five) minutes as needed for chest pain. <PLEASE MAKE APPOINTMENT> 02/25/16   Troy Sine, MD  potassium chloride (K-DUR) 10 MEQ tablet Take 10 mEq by mouth daily.    Historical Provider, MD  pseudoephedrine-acetaminophen (TYLENOL SINUS) 30-500 MG TABS tablet Take 1 tablet by mouth every 4 (four) hours as needed.    Historical Provider, MD  tamoxifen (NOLVADEX) 20 MG tablet Take 1 tablet (20 mg total) by mouth daily. 06/27/16   Truitt Merle, MD    Allergies    Contrast media [iodinated diagnostic agents]; Penicillins; Adhesive [tape]; Cephalexin; Ciprofloxacin; Demerol; Sulfamethoxazole-trimethoprim; and Quinolones  I reviewed & agree with nursing's documentation on the patient's past medical, surgical, social & family histories as well as their allergies.  Review of Systems  Complete ROS obtained, and is negative except as stated in HPI.   Physical Exam  Updated Vital  Signs BP 143/82   Pulse 61   Temp 97.7 F (36.5 C) (Oral)   Resp 18   Ht 5\' 1"  (1.549 m)   Wt 53.1 kg   SpO2 97%   BMI 22.11 kg/m  I have reviewed the triage vital signs and the nursing notes. Physical Exam  CONST: Patient alert, well appearing, in no apparent distress.  EYES: PERRLA. EOMI. Conjunctiva w/o d/c. Lids AT w/o swelling.  ENMT: External Nares & Ears AT w/o swelling. Oropharynx patent. MM moist.  NECK: ROM full w/o rigidity. Trachea midline. JVD absent.  CVS: +S1/S2 w/o obvious murmur. Lower extremities w/o pitting edema. Chest w/ well healing mastectomy scars per left breast.  RESP: Respiratory effort unlabored  w/o retractions & accessory muscle use. BS clear bilaterally.  GI: Firm and distended. +BS x 4. TTP absent. Pulsatile masses or abdominal bruits bilaterally absent. Guarding & Rebound absent. No femoral or inguinal hernias bilaterally. Rectal exam w/o hardened stool per vault.  BACK: CVA TTP absent bilaterally.  SKIN: Skin warm & dry. Turgor good. No rash.  PSYCH: Alert. Oriented. Affect and mood appropriate.  NEURO: CN II-XII grossly intact. Motor exam symmetric w/ upper & lower extremities 5/5 bilaterally. Sensation grossly intact.  MSK: Joints located & stable, w/o obvious dislocation & obvious deformity or crepitus absent w/ Cap refill < 2 sec. Peripheral pulses 2+ & equal in all extremities.    ED Treatments & Results   Labs (only abnormal results are displayed) Labs Reviewed  COMPREHENSIVE METABOLIC PANEL - Abnormal; Notable for the following:       Result Value   ALT 12 (*)    GFR calc non Af Amer 53 (*)    All other components within normal limits  CBC - Abnormal; Notable for the following:    Platelets 124 (*)    All other components within normal limits  URINALYSIS, ROUTINE W REFLEX MICROSCOPIC (NOT AT [facility]) - Abnormal; Notable for the following:    Ketones, ur 15 (*)    Leukocytes, UA SMALL (*)    All other components within normal limits    URINE MICROSCOPIC-ADD ON - Abnormal; Notable for the following:    Squamous Epithelial / LPF 0-5 (*)    Bacteria, UA MANY (*)    All other components within normal limits  URINE CULTURE  LIPASE, BLOOD  I-STAT CG4 LACTIC ACID, ED    EKG    EKG Interpretation  Date/Time:    Ventricular Rate:    PR Interval:    QRS Duration:   QT Interval:    QTC Calculation:   R Axis:     Text Interpretation:         Radiology Ct Abdomen Pelvis Wo Contrast  Result Date: 07/27/2016 CLINICAL DATA:  Left lower quadrant pain since 6 cm. Denies nausea vomiting or diarrhea. History of contrast allergy and unable to give IV contrast. EXAM: CT ABDOMEN AND PELVIS WITHOUT CONTRAST TECHNIQUE: Multidetector CT imaging of the abdomen and pelvis was performed following the standard protocol without IV contrast. COMPARISON:  11/19/2014 CT FINDINGS: Lower chest: Partially visualized radiation fibrosis in the lingula. Mastectomy changes of the left breast. Hepatobiliary: No focal liver abnormality is seen. No gallstones, gallbladder wall thickening, or biliary dilatation. Pancreas: Unremarkable. No pancreatic ductal dilatation or surrounding inflammatory changes. Spleen: Normal in size without focal abnormality. Adrenals/Urinary Tract: 3.8 x 4.6 cm upper pole renal cyst arising from the left kidney. 1.4 cm exophytic lower pole renal cyst unchanged from prior. Findings are incompletely characterized without IV contrast. However, the Hounsfield units appear to be less than 15 for both cysts suggesting simple cysts. Urinary bladder is unremarkable. The adrenal glands are normal. Stomach/Bowel: Normal bowel rotation. The stomach is nondistended. Contrast reaches the rectum. There is sigmoid diverticulosis without acute diverticulitis. There is no bowel obstruction. Vascular/Lymphatic: Atherosclerotic calcifications of the abdominal aorta without aneurysm. No adenopathy. Reproductive: Hysterectomy.  No adnexal mass. Other:  Injection granulomata overlying the left gluteal muscles. Musculoskeletal: Degenerative disc disease L4-5 and L5-S1 with vacuum disc phenomenon. Minimal anterolisthesis of L4 on L5. Degenerative facet arthropathy L3 through S1. IMPRESSION: Sigmoid diverticulosis without acute diverticulitis. No bowel obstruction or acute inflammation. Degenerative disc disease and facet arthropathy of the lower  lumbar spine with slight grade 1 anterolisthesis of L4 on L5. Stable appearing simple left-sided renal cysts. Electronically Signed   By: Ashley Royalty M.D.   On: 07/27/2016 20:06    Pertinent labs & imaging results that were available during my care of the patient were personally visualized by me and considered in my medical decision making, please see chart for details.  Procedures (including critical care time) Procedures  Medications Ordered in ED Medications  Barium Sulfate 2.1 % SUSP (not administered)  sodium chloride 0.9 % bolus 1,000 mL (1,000 mLs Intravenous New Bag/Given 07/27/16 1804)    Initial Impression & Assessment and Plan & ED Course   Initial Impression & Plan Patient presents to the emergency room for assessment of bilateral lower abdominal quadrant pain.  Exam without evidence of obvious hernia and due to bilateral nature with decreased stool output suspicious for constipation and increased stool burden given abdomen is distended.  Rectal exam reveals no gross blood and no harden impacted stool.  Patients Abdominal Exam is otherwise benign and w/o concern for peritonitis. No painful masses, no flank tenderness & overall was not consistent w/ Surgical Abdomen however due to previous abdominal surgeries and concern for developing SBO I obtained CT of abdomen and pelvis.  ED Course & Results I obtained screening labs, which upon revealed no acute concerns to explain symptoms. Imaging independently visualized & I appreciate no acute concerns and only visualize diverticulosis and significant  stool burden. Lactic acid and lipase normal. Urine reveals no concerns for infection.  Patient tolerating PO intake without development of abdominal pain or emesis.  Per their H&P, I have considered Perforated Viscous, Mesenteric Ischemia, Appendicitis, Cholecystitis, Pancreatitis, Diverticulitis, Incarcerated/Strangulated Hernia, SBO & Volvulus. ED course not consistent w/ these emergent etiologies.  Final Disposition Reassessment of the patient reveals serial Abdominal Exams remain benign. Due to concern for constipation, will have close follow up upon miralax regimen prescribed in the ED and will continue evaluation as outpatient as patient appears well w/o toxicity and is resting comfortably in bed.  ED Course in its entirety, care plan & clinical impressions w/ associated risks were reviewed w/ the patient and relative(s). In light of the patient's reassuring evaluation above, I consider discharge disposition reasonable. They are in agreement. I gave my typical, strict return precautions in simple, non-technical language. We also discussed symptoms that are most concerning & would necessitate emergent return. I explicitly told them to immediately return to the ED if new symptoms develop, if worse, or for ANY concern. Treatments & follow up plan agreed upon & I confirmed all concerns & questions were addressed prior to discharge.  Follow Up Lajean Manes, MD [address] [place] [address] [phone]  Schedule an appointment as soon as possible for a visit in 2 days For symptomatic reassessment  [place] [address] [id number] [place] [phone] Go to  [place], if the concerning symptoms we discussed develop   New Prescriptions Discharge Medication List as of 07/27/2016  8:27 PM    START taking these medications   Details  polyethylene glycol (MIRALAX /  GLYCOLAX) packet Take 2 capfuls of miralax for the next 4 days and then take 1.5 for the following 5 days., Print       Final Clinical Impressions(s) & ED Diagnoses   1. Groin pain, left   2. Constipation, unspecified constipation type    Patient care discussed  with the attending physician, who oversaw their evaluation & treatment & voiced agreement. Note: This document was prepared using Dragon voice recognition software and may include unintentional dictation errors.  House Officer: Voncille Lo, MD, Emergency Medicine Resident.   Voncille Lo, MD 08/05/16 [id number]    Isla Pence, MD 08/11/16 [phone]

## 2016-07-27 NOTE — ED Notes (Signed)
Patient transported to CT 

## 2016-07-28 LAB — URINE CULTURE

## 2016-08-01 ENCOUNTER — Encounter: Payer: Self-pay | Admitting: Radiation Oncology

## 2016-08-01 ENCOUNTER — Ambulatory Visit
Admission: RE | Admit: 2016-08-01 | Discharge: 2016-08-01 | Disposition: A | Discharge status: Home / Self Care | Payer: PPO | Source: Ambulatory Visit | Attending: Radiation Oncology | Admitting: Radiation Oncology

## 2016-08-01 VITALS — BP 119/64 | HR 68 | Temp 97.6°F | Ht 61.0 in | Wt 124.4 lb

## 2016-08-01 DIAGNOSIS — Z79899 Other long term (current) drug therapy: Secondary | ICD-10-CM | POA: Insufficient documentation

## 2016-08-01 DIAGNOSIS — C50912 Malignant neoplasm of unspecified site of left female breast: Secondary | ICD-10-CM | POA: Insufficient documentation

## 2016-08-01 DIAGNOSIS — C50112 Malignant neoplasm of central portion of left female breast: Secondary | ICD-10-CM

## 2016-08-01 DIAGNOSIS — Z170 Estrogen receptor positive status [ER+]: Secondary | ICD-10-CM

## 2016-08-01 DIAGNOSIS — Z87891 Personal history of nicotine dependence: Secondary | ICD-10-CM | POA: Insufficient documentation

## 2016-08-01 DIAGNOSIS — Z880 Allergy status to penicillin: Secondary | ICD-10-CM | POA: Diagnosis not present

## 2016-08-01 NOTE — Progress Notes (Addendum)
Department of Radiation Oncology  Phone:  [phone] Fax:        [phone]   Name: Gloria [name] MRN: [id number]  DOB: 26-Feb-1928  Date: 08/01/2016  Follow Up Visit Note  Diagnosis: Recurrent left breast cancer.  Summary and Interval since last radiation: 3 1/2 weeks  02/21/2016-03/03/2016: 70 Gy to the left chest wall  12/03/2010 - 01/23/2011: The left axilla and SCLV fossa to 52.8 Gy. Her chest wall was not radiated.  Narrative: Gloria [name] is a pleasant 82 y.o. patient who has been treated for a left breast cancer originally diagnosed in 2011. She underwent mastectomy followed by radiotherapy and estrogen blockade. She was found to have a recurrence in the spring of 2017 and retreated with radiotherapy to the left chest wall. She returns for follow up evaluation, and since her last visit, was noted to have palpable abnormality of the mastectomy scar. She has also undergone surgical resection on  07/17/16 again revealing invasive lobular carcinoma. She is currently on Tamoxifen as well with [name] and will follow up with [name] for further discussion of care this week.   On review of systems, the patient reports that since her surgery, she is doing well overall. She denies any chest pain, shortness of breath, cough, fevers, chills, night sweats, unintended weight changes. She denies any bowel or bladder disturbances, and denies abdominal pain, nausea or vomiting. She denies any new musculoskeletal or joint aches or pains. A complete review of systems is obtained and is otherwise negative.   Past Medical History:  Past Medical History:  Diagnosis Date  . Anxiety   . Blood in urine   . CAD (coronary artery disease)    non obstructive by cath  . Cancer [facility])    breast- left  . Depression   . GERD (gastroesophageal reflux disease)   . Hypertension   . Knee fracture   . TR (tricuspid regurgitation)    mild by Echo 12/2008 EF >55%    Past Surgical History: Past Surgical  History:  Procedure Laterality Date  . ABDOMINAL AORTAGRAM N/A 09/08/2011   Procedure: ABDOMINAL Maxcine Ham;  Surgeon: Troy Sine, MD;  Location: [facility] CATH LAB;  Service: Cardiovascular;  Laterality: N/A;  . ABDOMINAL HYSTERECTOMY    . BREAST SURGERY  08-30-10   mastectomy  . CARDIAC CATHETERIZATION  08/2011   20% LAD stenosis, 20% diagonal stenosis, 10-20% proximal dominant RCA stenosis  . COLON SURGERY    . LEFT HEART CATHETERIZATION WITH CORONARY ANGIOGRAM N/A 09/08/2011   Procedure: LEFT HEART CATHETERIZATION WITH CORONARY ANGIOGRAM;  Surgeon: Troy Sine, MD;  Location: [facility] CATH LAB;  Service: Cardiovascular;  Laterality: N/A;  . MINOR BREAST BIOPSY Left 11/25/2015   Procedure: MINOR EXCISION MASS LEFT CHEST WALL;  Surgeon: Autumn Messing III, MD;  Location: [place];  Service: General;  Laterality: Left;  . MINOR BREAST BIOPSY Left 07/17/2016   Procedure: EXCISION OF 2 LEFT CHEST WALL MASSES;  Surgeon: Autumn Messing III, MD;  Location: [place];  Service: General;  Laterality: Left;  . TONSILLECTOMY      Social History:  Social History   Social History  . Marital status: Divorced    Spouse name: N/A  . Number of children: N/A  . Years of education: N/A   Occupational History  . Not on file.   Social History Main Topics  . Smoking status: Former Smoker    Quit date: 11/15/1991  . Smokeless tobacco: Never Used  .  Alcohol use 1.2 oz/week    2 Glasses of wine per week     Comment: 1 glass per week  . Drug use: No  . Sexual activity: Not Currently   Other Topics Concern  . Not on file   Social History Narrative  . No narrative on file    Family History: Family History  Problem Relation Age of Onset  . Cancer Father     prostate  . Cancer Sister     breast  . Heart disease Sister   . Cancer Brother     pancreatic  . Cancer Maternal Grandmother     colon  . Dementia Mother   . Sudden death Brother   . Heart attack Brother   . Heart  attack Brother   . Arthritis Sister    Allergies:  Allergies  Allergen Reactions  . Contrast Media [Iodinated Diagnostic Agents] Anaphylaxis  . Penicillins Anaphylaxis and Swelling    Has patient had a PCN reaction causing immediate rash, facial/tongue/throat swelling, SOB or lightheadedness with hypotension: Yes Has patient had a PCN reaction causing severe rash involving mucus membranes or skin necrosis: No Has patient had a PCN reaction that required hospitalization Yes Has patient had a PCN reaction occurring within the last 10 years: No If all of the above answers are "NO", then may proceed with Cephalosporin use.   . Adhesive [Tape] Other (See Comments)    blisters  . Cephalexin Swelling  . Ciprofloxacin Swelling  . Demerol Nausea And Vomiting  . Sulfamethoxazole-Trimethoprim Itching and Swelling    REACTION: swelling/hives  . Quinolones Itching and Rash    REACTION: itching, rash Pt. Reports no problems with levaquin     Medications:  Current Meds  Medication Sig  . acetaminophen (TYLENOL) 500 MG tablet Take 1,000 mg by mouth daily as needed for moderate pain. Reported on 03/10/2016  . ALPRAZolam (XANAX) 0.25 MG tablet Take 1/2 tablet prior to radiation and 1 tablet prior to bedtime.  Marland Kitchen CALCIUM-MAGNESIUM-VITAMIN D PO Take 1 tablet by mouth 2 (two) times daily.  . cetirizine (ZYRTEC) 10 MG tablet Take 10 mg by mouth as needed for allergies.   Marland Kitchen doxepin (SINEQUAN) 10 MG capsule Take 10 mg by mouth at bedtime.   . fluticasone (FLONASE) 50 MCG/ACT nasal spray Place 1 spray into both nostrils daily as needed for allergies.   Marland Kitchen guaiFENesin (MUCINEX) 600 MG 12 hr tablet Take 600 mg by mouth daily as needed for cough or to loosen phlegm. Reported on 03/28/2016  . losartan (COZAAR) 25 MG tablet Take 25 mg by mouth daily.   . nitroGLYCERIN (NITROSTAT) 0.4 MG SL tablet Place 1 tablet (0.4 mg total) under the tongue every 5 (five) minutes as needed for chest pain. <PLEASE MAKE  APPOINTMENT>  . polyethylene glycol (MIRALAX / GLYCOLAX) packet Take 2 capfuls of miralax for the next 4 days and then take 1.5 for the following 5 days.  . potassium chloride (K-DUR) 10 MEQ tablet Take 10 mEq by mouth daily.  . pseudoephedrine-acetaminophen (TYLENOL SINUS) 30-500 MG TABS tablet Take 1 tablet by mouth every 4 (four) hours as needed.  . tamoxifen (NOLVADEX) 20 MG tablet Take 1 tablet (20 mg total) by mouth daily.   Physical Exam:  Vitals:   08/01/16 1324  BP: 119/64  Pulse: 68  Temp: 97.6 F (36.4 C)  TempSrc: Oral  Weight: 124 lb 6.4 oz (56.4 kg)  Height: 5\' 1"  (1.549 m)  Skin is completely healed. No evidence of  moist desquamation. No drainage.  IMPRESSION/PLAN: 1. Recurrent Stage IIB, T2N1a,M0 ER/PR invasive lobular carcinoma of the left breast. The patient has done well with post radiotherapy skin side effects, however unfortunately has been found to have recurrence. These have been resected, and she will continue to follow up with [name] to discuss further treatment with estrogen blockade. We would be happy to follow up with her as needed in the future.    Carola Rhine, PAC

## 2016-08-01 NOTE — Progress Notes (Signed)
Gloria [name] here fro reassessment S/P XRT to her Left chest wall and left axilla. She denies any pain in the the treatment.  Reports numbeness in the axillary region.  She reports fatigue.  Taking Tamoxifen since here last visit with [name].  Recent visit to the ED for Groin pain 07/27/16.    BP 119/64   Pulse 68   Temp 97.6 F (36.4 C) (Oral)   Ht 5\' 1"  (1.549 m)   Wt 124 lb 6.4 oz (56.4 kg)   BMI 23.51 kg/m    Wt Readings from Last 3 Encounters:  08/01/16 124 lb 6.4 oz (56.4 kg)  07/27/16 117 lb (53.1 kg)  06/27/16 126 lb 1.6 oz (57.2 kg)

## 2016-08-02 ENCOUNTER — Telehealth (HOSPITAL_COMMUNITY): Payer: Self-pay | Admitting: Cardiovascular Disease

## 2016-08-02 NOTE — Telephone Encounter (Signed)
Pt has been contacted a few times(5/16,9/26,10/4) to get her Echo rescheduled and have yet to receive a call back. Dr. Evette Georges nurse has been notified and she will be removed from the workqueue. If in fact the patient does call back the test will be reordered by the ordering physician.

## 2016-08-03 ENCOUNTER — Ambulatory Visit (HOSPITAL_BASED_OUTPATIENT_CLINIC_OR_DEPARTMENT_OTHER): Payer: PPO | Admitting: Hematology

## 2016-08-03 ENCOUNTER — Encounter: Payer: Self-pay | Admitting: Hematology

## 2016-08-03 ENCOUNTER — Telehealth: Payer: Self-pay | Admitting: Hematology

## 2016-08-03 VITALS — BP 131/83 | HR 77 | Temp 97.8°F | Resp 17 | Ht 61.0 in | Wt 122.8 lb

## 2016-08-03 DIAGNOSIS — C44501 Unspecified malignant neoplasm of skin of breast: Secondary | ICD-10-CM | POA: Diagnosis not present

## 2016-08-03 DIAGNOSIS — I2510 Atherosclerotic heart disease of native coronary artery without angina pectoris: Secondary | ICD-10-CM

## 2016-08-03 DIAGNOSIS — Z23 Encounter for immunization: Secondary | ICD-10-CM | POA: Diagnosis not present

## 2016-08-03 DIAGNOSIS — R1032 Left lower quadrant pain: Secondary | ICD-10-CM

## 2016-08-03 DIAGNOSIS — C50112 Malignant neoplasm of central portion of left female breast: Secondary | ICD-10-CM

## 2016-08-03 DIAGNOSIS — I10 Essential (primary) hypertension: Secondary | ICD-10-CM

## 2016-08-03 MED ORDER — INFLUENZA VAC SPLIT QUAD 0.5 ML IM SUSY
0.5000 mL | PREFILLED_SYRINGE | Freq: Once | INTRAMUSCULAR | Status: AC
  Administered 2016-08-03: 0.5 mL via INTRAMUSCULAR
  Filled 2016-08-03: qty 0.5

## 2016-08-03 NOTE — Telephone Encounter (Signed)
Avs report and appointment schedule given to patient, per 08/03/16 los. ° °

## 2016-08-03 NOTE — Progress Notes (Signed)
Gloria [name]  [phone] [phone]) [phone]  OFFICE PROGRESS NOTE  PATIENT: Gloria [name]   DOB: 09/21/1928  MR#: [id number]  GDJ#:[id number]  QQ:[id number],JHE Gloria Moores, MD [name], MD [name]. [name], MD  DIAGNOSIS:  An 82 year old [place] woman with invasive lobular carcinoma of the left breast diagnosed in 07/2010, chest wall local recurrence in 09/2015 .  Oncology History   Cancer of central portion of left female breast [facility])   Staging form: Breast, AJCC 7th Edition     Pathologic stage from 08/04/2010: Stage IIB (T2, N1a, cM0) - Signed by Truitt Merle, MD on 12/09/2015       Cancer of central portion of left female breast ([place])   08/03/2010 Mammogram     left nipple retraction, and the palpable mass subareolar left breast at the 6:00 position. Ultrasound confirmed the presence of a mass measuring 2.6 x 1.7 x 2.4 cm      08/04/2010 Initial Biopsy    left breast mass biopsy showed an invasive mammary carcinoma with lobular features. ER 95%, PR 97%, Ki-67 17%, HER-2/neu (-)      08/04/2010 Pathologic Stage    Stage IIB: T2 N1a      08/11/2010 Imaging    left breast mass 4.6 x 4.2 x 2.3 cm with enhancement distortion extending to the nipple retraction. There is questionable anterior mediastinal lymph node seen      07/2010 - 09/03/2010 Anti-estrogen oral therapy    neoadjuvant letrozole       09/03/2010 Surgery     left breast mastectomy with sentinel node biopsy on 09/03/2010.      08/2010 - 01/2015 Anti-estrogen oral therapy    Tamoxifen, pt stopped on her own       12/03/2010 - 01/23/2011 Radiation Therapy    left chest wall and axilla radiaiton       10/19/2015 Progression    Left chest wall recurrence, s/p resection on 11/25/2015 with positive margins ([facility])       12/10/2015 -  Anti-estrogen oral therapy    anastrozole 51m daily, which was switched to Tamoxifen on 06/30/2016 due to persistent disease.      01/03/2016 - 03/03/2016  Radiation Therapy    Radiation to left chest wall recurrence (12/06/2015-02/04/2016 - 44 Gy), pt progressed through the radiation, and had additional 10 fractions of radiation (02/21/16-03/03/2016 - total 70 Gy to the left chest wall)      07/17/2016 Surgery    Chest wall soft tissue resection for local recurrence      07/17/2016 Pathology Results    Left chest wall two soft tissue resection both showed invasive lobular carcinoma, margins were positive. ER 95% positive, PR 60% positive, HER-2 negative.        CURRENT THERAPY:  Tamoxifen 242mdaily    INTERVAL HISTORY:  SaChaniyaeturns for follow-up. She is accompanied by her friend to my clinic today. She underwent left chest wall soft tissue mass resection by Dr. ToMarlou Starksn 07/18/2016. She tolerated surgery well. She has been having intermittent left lower quadrant abdominal pain in the past few weeks, she was seen at the ED last week, CT scan was negative except diverticulosis, and it was suggested to take stool softener. She has mild constipation, has been using MiraLAX half dose once a day, no nausea, melena or hematochezia. She otherwise feels well, denies any other new symptoms. She functions well at home.   PAST MEDICAL HISTORY: Past Medical History:  Diagnosis Date  .  Anxiety   . Blood in urine   . CAD (coronary artery disease)    non obstructive by cath  . Cancer [facility])    breast- left  . Depression   . GERD (gastroesophageal reflux disease)   . Hypertension   . Knee fracture   . TR (tricuspid regurgitation)    mild by Echo 12/2008 EF >55%    PAST SURGICAL HISTORY: Past Surgical History:  Procedure Laterality Date  . ABDOMINAL AORTAGRAM N/A 09/08/2011   Procedure: ABDOMINAL Maxcine Ham;  Surgeon: Gloria Sine, MD;  Location: [facility] CATH LAB;  Service: Cardiovascular;  Laterality: N/A;  . ABDOMINAL HYSTERECTOMY    . BREAST SURGERY  08-30-10   mastectomy  . CARDIAC CATHETERIZATION  08/2011   20% LAD stenosis, 20% diagonal stenosis,  10-20% proximal dominant RCA stenosis  . COLON SURGERY    . LEFT HEART CATHETERIZATION WITH CORONARY ANGIOGRAM N/A 09/08/2011   Procedure: LEFT HEART CATHETERIZATION WITH CORONARY ANGIOGRAM;  Surgeon: Gloria Sine, MD;  Location: [facility] CATH LAB;  Service: Cardiovascular;  Laterality: N/A;  . MINOR BREAST BIOPSY Left 11/25/2015   Procedure: MINOR EXCISION MASS LEFT CHEST WALL;  Surgeon: Gloria Messing III, MD;  Location: [place];  Service: General;  Laterality: Left;  . MINOR BREAST BIOPSY Left 07/17/2016   Procedure: EXCISION OF 2 LEFT CHEST WALL MASSES;  Surgeon: Gloria Messing III, MD;  Location: [place];  Service: General;  Laterality: Left;  . TONSILLECTOMY      FAMILY HISTORY: Family History  Problem Relation Age of Onset  . Cancer Father     prostate  . Cancer Sister     breast  . Heart disease Sister   . Cancer Brother     pancreatic  . Cancer Maternal Grandmother     colon  . Dementia Mother   . Sudden death Brother   . Heart attack Brother   . Heart attack Brother   . Arthritis Sister     SOCIAL HISTORY: Social History  Substance Use Topics  . Smoking status: Former Smoker    Quit date: 11/15/1991  . Smokeless tobacco: Never Used  . Alcohol use 1.2 oz/week    2 Glasses of wine per week     Comment: 1 glass per week    ALLERGIES: Allergies  Allergen Reactions  . Contrast Media [Iodinated Diagnostic Agents] Anaphylaxis  . Penicillins Anaphylaxis and Swelling    Has patient had a PCN reaction causing immediate rash, facial/tongue/throat swelling, SOB or lightheadedness with hypotension: Yes Has patient had a PCN reaction causing severe rash involving mucus membranes or skin necrosis: No Has patient had a PCN reaction that required hospitalization Yes Has patient had a PCN reaction occurring within the last 10 years: No If all of the above answers are "NO", then may proceed with Cephalosporin use.   . Adhesive [Tape] Other (See Comments)     blisters  . Cephalexin Swelling  . Ciprofloxacin Swelling  . Demerol Nausea And Vomiting  . Sulfamethoxazole-Trimethoprim Itching and Swelling    REACTION: swelling/hives  . Quinolones Itching and Rash    REACTION: itching, rash Pt. Reports no problems with levaquin     MEDICATIONS:  Current Outpatient Prescriptions  Medication Sig Dispense Refill  . acetaminophen (TYLENOL) 500 MG tablet Take 1,000 mg by mouth daily as needed for moderate pain. Reported on 03/10/2016    . ALPRAZolam (XANAX) 0.25 MG tablet Take 1/2 tablet prior to radiation and 1 tablet prior to bedtime.  30 tablet 1  . aspirin 81 MG chewable tablet Chew 81 mg by mouth once a week. Reported on 03/10/2016    . CALCIUM-MAGNESIUM-VITAMIN D PO Take 1 tablet by mouth 2 (two) times daily.    . cetirizine (ZYRTEC) 10 MG tablet Take 10 mg by mouth as needed for allergies.     Marland Kitchen doxepin (SINEQUAN) 10 MG capsule Take 10 mg by mouth at bedtime.     . fluticasone (FLONASE) 50 MCG/ACT nasal spray Place 1 spray into both nostrils daily as needed for allergies.     Marland Kitchen guaiFENesin (MUCINEX) 600 MG 12 hr tablet Take 600 mg by mouth daily as needed for cough or to loosen phlegm. Reported on 03/28/2016    . losartan (COZAAR) 25 MG tablet Take 25 mg by mouth daily.   0  . nitroGLYCERIN (NITROSTAT) 0.4 MG SL tablet Place 1 tablet (0.4 mg total) under the tongue every 5 (five) minutes as needed for chest pain. <PLEASE MAKE APPOINTMENT> 25 tablet 3  . polyethylene glycol (MIRALAX / GLYCOLAX) packet Take 2 capfuls of miralax for the next 4 days and then take 1.5 for the following 5 days. 30 each 0  . potassium chloride (K-DUR) 10 MEQ tablet Take 10 mEq by mouth daily.    . pseudoephedrine-acetaminophen (TYLENOL SINUS) 30-500 MG TABS tablet Take 1 tablet by mouth every 4 (four) hours as needed.    . tamoxifen (NOLVADEX) 20 MG tablet Take 1 tablet (20 mg total) by mouth daily. 30 tablet 3   No current facility-administered medications for this  visit.       REVIEW OF SYSTEMS: A 10 point review of systems was completed and is negative except as noted above.   Health Maintenance  Mammogram: 10/19/2015   PHYSICAL EXAMINATION: BP 131/83 (BP Location: Left Arm, Patient Position: Sitting)   Pulse 77   Temp 97.8 F (36.6 C) (Oral)   Resp 17   Ht 5' 1"  (1.549 m)   Wt 122 lb 12.8 oz (55.7 kg)   SpO2 97%   BMI 23.20 kg/m   GENERAL: Patient is a well appearing female in no acute distress HEENT:  Sclerae anicteric.  Oropharynx clear and moist. No ulcerations or evidence of oropharyngeal candidiasis. Neck is supple.  NODES:   There is no cervical , supraclavicular, or axillary lymphadenopathy palpated.  BREAST EXAM:  Left breast is surgically absent,  incision in the left frontal chest has healed well. No palpable nodules on left chest wall.  Right breast and axillas no masses or nodules.   LUNGS:  Clear to auscultation bilaterally.  No wheezes or rhonchi. HEART:  Regular rate and rhythm. No murmur appreciated. ABDOMEN:  Soft, nontender.  Positive, normoactive bowel sounds. No organomegaly palpated. MSK:  No focal spinal tenderness to palpation. Full range of motion bilaterally in the upper extremities. EXTREMITIES:  No peripheral edema.   SKIN:  Clear with no obvious rashes or skin changes. No nail dyscrasia. NEURO:  Nonfocal. Well oriented.  Appropriate affect.  ECOG FS:  Grade 1 - Symptomatic but completely ambulatory   LAB RESULTS: CBC Latest Ref Rng & Units 07/27/2016 07/11/2016 06/06/2016  WBC 4.0 - 10.5 K/uL 4.0 4.0 4.4  Hemoglobin 12.0 - 15.0 g/dL 13.6 13.6 13.7  Hematocrit 36.0 - 46.0 % 42.5 40.9 42.2  Platelets 150 - 400 K/uL 124(L) 128(L) 139(L)      CMP Latest Ref Rng & Units 07/27/2016 07/11/2016 06/06/2016  Glucose 65 - 99 mg/dL 95 67(L) 102  BUN  6 - 20 mg/dL 14 15.9 18.0  Creatinine 0.44 - 1.00 mg/dL 0.95 0.9 0.8  Sodium 135 - 145 mmol/L 143 144 140  Potassium 3.5 - 5.1 mmol/L 4.2 4.1 4.4  Chloride 101 - 111  mmol/L 108 - -  CO2 22 - 32 mmol/L 27 26 25   Calcium 8.9 - 10.3 mg/dL 9.4 9.4 9.5  Total Protein 6.5 - 8.1 g/dL 6.5 6.4 6.6  Total Bilirubin 0.3 - 1.2 mg/dL 0.8 0.73 0.59  Alkaline Phos 38 - 126 U/L 47 67 71  AST 15 - 41 U/L 23 20 20   ALT 14 - 54 U/L 12(L) 14 15   PATHOLOGY REPORT Diagnosis 11/25/2015 Soft tissue mass, simple excision, Left chest wall - INVASIVE LOBULAR CARCINOMA, PRESENT AT NON ORIENTED TISSUE EDGE(S). - SEE COMMENT. Microscopic Comment The carcinoma appears grade 2. A breast prognostic profile will be performed and the results reported separately. (JDP:gt, 11/26/15).  Results: IMMUNOHISTOCHEMICAL AND MORPHOMETRIC ANALYSIS PERFORMED MANUALLY Estrogen Receptor: 95%, POSITIVE, STRONG STAINING INTENSITY Progesterone Receptor: 60%, POSITIVE, STRONG STAINING INTENSITY Results: HER2 - NEGATIVE RATIO OF HER2/CEP17 SIGNALS 1.07 AVERAGE HER2 COPY NUMBER PER CELL 2.90  Diagnosis 07/17/2016 1. Soft tissue mass, simple excision, Chest wall medial left - INVASIVE LOBULAR CARCINOMA, SEE COMMENT. - NODULE OF FIBROSIS AND CALCIFICATION. 2. Soft tissue mass, simple excision, Chest wall lateral left - INVASIVE LOBULAR CARCINOMA, SEE COMMENT. Microscopic Comment 1. -2. Both parts reveal foci of invasive lobular carcinoma. There is only limited tumor in part #1. Tumor does extend to the uninked edges of both parts. The carcinoma appears grade 2. A prognostic panel has been ordered on part #2.  2. PROGNOSTIC INDICATORS Results: IMMUNOHISTOCHEMICAL AND MORPHOMETRIC ANALYSIS PERFORMED MANUALLY Estrogen Receptor: 60%, POSITIVE, STRONG STAINING INTENSITY Progesterone Receptor: 90%, POSITIVE, STRONG STAINING INTENSITY Proliferation Marker Ki67: 10%  Results: HER2 - NEGATIVE RATIO OF HER2/CEP17 SIGNALS 1.20 AVERAGE HER2 COPY NUMBER PER CELL 2.40   RADIOGRAPHIC STUDIES: CT chest, abdomen and pelvis wo contrast  07/27/2016 IMPRESSION: Sigmoid diverticulosis without acute  diverticulitis. No bowel obstruction or acute inflammation.  Degenerative disc disease and facet arthropathy of the lower lumbar spine with slight grade 1 anterolisthesis of L4 on L5.  Stable appearing simple left-sided renal cysts.  PET 07/14/2016 IMPRESSION: 1. No findings of recurrent malignancy. 2. Radiation fibrosis in the lingula. A small left axillary lymph node is not hypermetabolic and only 6 mm in diameter. This is in the vicinity of the prior axillary cyst. 3. Pelvic floor laxity.   ASSESSMENT/PLAN: 82 y.o. with:  1. Stage IIB, T2 N1 invasive lobular carcinoma, grade 2, ER 95%, PR 97%, Ki-67 17%, HER-2/neu no amplification, diagnosed in 07/2010, local chest wall recurrence in 09/2015   -She was on adjuvant tamoxifen for 4.5 years, stopped on her own. -She unfortunately developed local recurrence 8 months after she stopped tamoxifen. -I previously reviewed her surgical pathology findings, which is consistent with lobular carcinoma recurrence from her previous breast cancer. -The tumor was surgically removed, with positive margins. -Her restaging CT scan and bone scan in 12/2015 was negative for distant metastasis. -She has completed chest wall radiation, however developed 2-3 small during the radiation and anastrozole therapy, supicious for residual cancer. US showed a 4 mm and 6 mm subcutaneous nodule, partially calcified, highly suspicious for local recurrence. -She subsequently underwent a second chest wall surgery for two soft tissue resection, I reviewed her pathology results, both showed invasive lobular carcinoma, margins were positive. I spoke with Dr. Marlou Starks, no more surgery will be  offered, because complete surgical resection is unlikely. Patient is also not in favor of more extensive surgery. -We discussed her that her cancer is unlikely curable at this stage, and our goal of therapy is palliative for disease control, and prolong her life. She voiced good  understanding. -I have changed her endocrine therapy to tamoxifen, she is tolerating very well, we'll continue indefinitely. -Her last PET scan and a CT abdomen and pelvis in September 2017 were negative for metastatic disease -If she is clinically doing well, I'll plan to repeat scan in 6 months.  2. HTN, CAD  -She'll continue follow-up with her primary care physician and cardiologist  3. Low abdominal pain -She has had intermittent left low abdominal pain, CT abdomen was negative except diverticulosis -Possibly related to constipation. I encouraged her to use for dose MiraLAX once or twice a day, or try Senokot, to make sure she has good bowel movement.  Plan -Continue tamoxifen indefinitely -I'll see her back in 6 weeks for follow-up -Flu shot today  I spent 25 minutes counseling the patient face to face.  The total time spent in the appointment was 30 minutes.  Truitt Merle  08/03/2016

## 2016-08-18 DIAGNOSIS — H00022 Hordeolum internum right lower eyelid: Secondary | ICD-10-CM | POA: Diagnosis not present

## 2016-08-22 ENCOUNTER — Other Ambulatory Visit: Payer: Self-pay

## 2016-08-22 ENCOUNTER — Ambulatory Visit (HOSPITAL_COMMUNITY): Payer: PPO | Attending: Cardiology

## 2016-08-22 DIAGNOSIS — I25810 Atherosclerosis of coronary artery bypass graft(s) without angina pectoris: Secondary | ICD-10-CM

## 2016-08-22 DIAGNOSIS — I2510 Atherosclerotic heart disease of native coronary artery without angina pectoris: Secondary | ICD-10-CM | POA: Insufficient documentation

## 2016-08-22 DIAGNOSIS — C50919 Malignant neoplasm of unspecified site of unspecified female breast: Secondary | ICD-10-CM | POA: Insufficient documentation

## 2016-08-23 DIAGNOSIS — J449 Chronic obstructive pulmonary disease, unspecified: Secondary | ICD-10-CM | POA: Diagnosis not present

## 2016-08-23 DIAGNOSIS — I10 Essential (primary) hypertension: Secondary | ICD-10-CM | POA: Diagnosis not present

## 2016-08-23 DIAGNOSIS — C50312 Malignant neoplasm of lower-inner quadrant of left female breast: Secondary | ICD-10-CM | POA: Diagnosis not present

## 2016-08-28 DIAGNOSIS — H00022 Hordeolum internum right lower eyelid: Secondary | ICD-10-CM | POA: Diagnosis not present

## 2016-09-19 ENCOUNTER — Ambulatory Visit (HOSPITAL_BASED_OUTPATIENT_CLINIC_OR_DEPARTMENT_OTHER): Payer: PPO | Admitting: Hematology

## 2016-09-19 ENCOUNTER — Encounter: Payer: Self-pay | Admitting: Hematology

## 2016-09-19 ENCOUNTER — Telehealth: Payer: Self-pay | Admitting: Hematology

## 2016-09-19 ENCOUNTER — Other Ambulatory Visit (HOSPITAL_BASED_OUTPATIENT_CLINIC_OR_DEPARTMENT_OTHER): Payer: PPO

## 2016-09-19 DIAGNOSIS — Z170 Estrogen receptor positive status [ER+]: Secondary | ICD-10-CM | POA: Diagnosis not present

## 2016-09-19 DIAGNOSIS — C44501 Unspecified malignant neoplasm of skin of breast: Secondary | ICD-10-CM | POA: Diagnosis not present

## 2016-09-19 DIAGNOSIS — M25512 Pain in left shoulder: Secondary | ICD-10-CM

## 2016-09-19 DIAGNOSIS — I10 Essential (primary) hypertension: Secondary | ICD-10-CM | POA: Diagnosis not present

## 2016-09-19 DIAGNOSIS — C50112 Malignant neoplasm of central portion of left female breast: Secondary | ICD-10-CM

## 2016-09-19 DIAGNOSIS — C50912 Malignant neoplasm of unspecified site of left female breast: Secondary | ICD-10-CM

## 2016-09-19 LAB — CBC WITH DIFFERENTIAL/PLATELET
BASO%: 1 % (ref 0.0–2.0)
BASOS ABS: 0 10*3/uL (ref 0.0–0.1)
EOS%: 4.4 % (ref 0.0–7.0)
Eosinophils Absolute: 0.2 10*3/uL (ref 0.0–0.5)
HCT: 38.4 % (ref 34.8–46.6)
HGB: 12.8 g/dL (ref 11.6–15.9)
LYMPH%: 18.6 % (ref 14.0–49.7)
MCH: 31.4 pg (ref 25.1–34.0)
MCHC: 33.3 g/dL (ref 31.5–36.0)
MCV: 94.3 fL (ref 79.5–101.0)
MONO#: 0.3 10*3/uL (ref 0.1–0.9)
MONO%: 7.7 % (ref 0.0–14.0)
NEUT#: 2.8 10*3/uL (ref 1.5–6.5)
NEUT%: 68.3 % (ref 38.4–76.8)
Platelets: 113 10*3/uL — ABNORMAL LOW (ref 145–400)
RBC: 4.07 10*6/uL (ref 3.70–5.45)
RDW: 13.3 % (ref 11.2–14.5)
WBC: 4.1 10*3/uL (ref 3.9–10.3)
lymph#: 0.8 10*3/uL — ABNORMAL LOW (ref 0.9–3.3)

## 2016-09-19 LAB — COMPREHENSIVE METABOLIC PANEL
ALT: 11 U/L (ref 0–55)
AST: 18 U/L (ref 5–34)
Albumin: 3.5 g/dL (ref 3.5–5.0)
Alkaline Phosphatase: 57 U/L (ref 40–150)
Anion Gap: 7 mEq/L (ref 3–11)
BUN: 21.9 mg/dL (ref 7.0–26.0)
CALCIUM: 9.1 mg/dL (ref 8.4–10.4)
CHLORIDE: 107 meq/L (ref 98–109)
CO2: 28 meq/L (ref 22–29)
Creatinine: 1 mg/dL (ref 0.6–1.1)
EGFR: 50 mL/min/{1.73_m2} — ABNORMAL LOW (ref >90)
GLUCOSE: 88 mg/dL (ref 70–140)
POTASSIUM: 4.5 meq/L (ref 3.5–5.1)
SODIUM: 141 meq/L (ref 136–145)
Total Bilirubin: 0.47 mg/dL (ref 0.20–1.20)
Total Protein: 6.3 g/dL — ABNORMAL LOW (ref 6.4–8.3)

## 2016-09-19 MED ORDER — TAMOXIFEN CITRATE 20 MG PO TABS: 20.0000 mg | ORAL_TABLET | Freq: Every day | ORAL | 1 refills | Status: DC

## 2016-09-19 NOTE — Telephone Encounter (Signed)
Appointments scheduled per 11/21 LOS. Patient given AVS report and calendars with future scheduled appointments. °

## 2016-09-19 NOTE — Progress Notes (Signed)
Gloria [name]  [phone] [phone]  OFFICE PROGRESS NOTE  PATIENT: Gloria [name]   DOB: May 13, 1928  MR#: [id number]  YOV#:[id number]  XA:JOINOMVEH,MCN Gloria Moores, MD Gloria Edison, MD Gloria [name]. Gloria Nakayama, MD  DIAGNOSIS:  An 82 year old [place] woman with invasive lobular carcinoma of the left breast diagnosed in 07/2010, chest wall local recurrence in 09/2015 .  Oncology History   Cancer of central portion of left female breast [facility])   Staging form: Breast, AJCC 7th Edition     Pathologic stage from 08/04/2010: Stage IIB (T2, N1a, cM0) - Signed by Gloria Merle, MD on 12/09/2015       Cancer of central portion of left female breast ([name])   08/03/2010 Mammogram     left nipple retraction, and the palpable mass subareolar left breast at the 6:00 position. Ultrasound confirmed the presence of a mass measuring 2.6 x 1.7 x 2.4 cm      08/04/2010 Initial Biopsy    left breast mass biopsy showed an invasive mammary carcinoma with lobular features. ER 95%, PR 97%, Ki-67 17%, HER-2/neu (-)      08/04/2010 Pathologic Stage    Stage IIB: T2 N1a      08/11/2010 Imaging    left breast mass 4.6 x 4.2 x 2.3 cm with enhancement distortion extending to the nipple retraction. There is questionable anterior mediastinal lymph node seen      07/2010 - 09/03/2010 Anti-estrogen oral therapy    neoadjuvant letrozole       09/03/2010 Surgery     left breast mastectomy with sentinel node biopsy on 09/03/2010.      08/2010 - 01/2015 Anti-estrogen oral therapy    Tamoxifen, pt stopped on her own       12/03/2010 - 01/23/2011 Radiation Therapy    left chest wall and axilla radiaiton       10/19/2015 Progression    Left chest wall recurrence, s/p resection on 11/25/2015 with positive margins ([facility])       12/10/2015 -  Anti-estrogen oral therapy    anastrozole 28m daily, which was switched to Tamoxifen on 06/30/2016 due to persistent disease.      01/03/2016 - 03/03/2016  Radiation Therapy    Radiation to left chest wall recurrence (12/06/2015-02/04/2016 - 44 Gy), pt progressed through the radiation, and had additional 10 fractions of radiation (02/21/16-03/03/2016 - total 70 Gy to the left chest wall)      07/17/2016 Surgery    Chest wall soft tissue resection for local recurrence      07/17/2016 Pathology Results    Left chest wall two soft tissue resection both showed invasive lobular carcinoma, margins were positive. ER 95% positive, PR 60% positive, HER-2 negative.        CURRENT THERAPY:  Tamoxifen 257mdaily    INTERVAL HISTORY:  SaAbrinaeturns for follow-up. She is accompanied by her friend to my clinic today. She Is doing well overall, she reports mild pain at the left shoulder blade, which has been going on for a year, intermittent, tolerable. She denies any other new pain, or other discomfort. She has good appetite, and energy level, remains to be physically active. She lives independently, and helps her daughter and her family.   PAST MEDICAL HISTORY: Past Medical History:  Diagnosis Date  . Anxiety   . Blood in urine   . CAD (coronary artery disease)    non obstructive by cath  [facility]  .  Depression   . GERD (gastroesophageal reflux disease)   . Hypertension   . Knee fracture   . TR (tricuspid regurgitation)    mild by Echo 12/2008 EF >55%    PAST SURGICAL HISTORY: Past Surgical History:  Procedure Laterality Date  . ABDOMINAL AORTAGRAM N/A 09/08/2011   Procedure: ABDOMINAL Maxcine Ham;  Surgeon: Gloria Sine, MD;  Location: [facility] CATH LAB;  Service: Cardiovascular;  Laterality: N/A;  . ABDOMINAL HYSTERECTOMY    . BREAST SURGERY  08-30-10   mastectomy  . CARDIAC CATHETERIZATION  08/2011   20% LAD stenosis, 20% diagonal stenosis, 10-20% proximal dominant RCA stenosis  . COLON SURGERY    . LEFT HEART CATHETERIZATION WITH CORONARY ANGIOGRAM N/A 09/08/2011   Procedure: LEFT HEART CATHETERIZATION WITH CORONARY ANGIOGRAM;   Surgeon: Gloria Sine, MD;  Location: [facility] CATH LAB;  Service: Cardiovascular;  Laterality: N/A;  . MINOR BREAST BIOPSY Left 11/25/2015   Procedure: MINOR EXCISION MASS LEFT CHEST WALL;  Surgeon: Gloria Messing III, MD;  Location: [place];  Service: General;  Laterality: Left;  . MINOR BREAST BIOPSY Left 07/17/2016   Procedure: EXCISION OF 2 LEFT CHEST WALL MASSES;  Surgeon: Gloria Messing III, MD;  Location: [place];  Service: General;  Laterality: Left;  . TONSILLECTOMY      FAMILY HISTORY: Family History  Problem Relation Age of Onset  . Cancer Father     prostate  . Cancer Sister     breast  . Heart disease Sister   . Cancer Brother     pancreatic  . Cancer Maternal Grandmother     colon  . Dementia Mother   . Sudden death Brother   . Heart attack Brother   . Heart attack Brother   . Arthritis Sister     SOCIAL HISTORY: Social History  Substance Use Topics  . Smoking status: Former Smoker    Quit date: 11/15/1991  . Smokeless tobacco: Never Used  . Alcohol use 1.2 oz/week    2 Glasses of wine per week     Comment: 1 glass per week    ALLERGIES: Allergies  Allergen Reactions  . Contrast Media [Iodinated Diagnostic Agents] Anaphylaxis  . Penicillins Anaphylaxis and Swelling    Has patient had a PCN reaction causing immediate rash, facial/tongue/throat swelling, SOB or lightheadedness with hypotension: Yes Has patient had a PCN reaction causing severe rash involving mucus membranes or skin necrosis: No Has patient had a PCN reaction that required hospitalization Yes Has patient had a PCN reaction occurring within the last 10 years: No If all of the above answers are "NO", then may proceed with Cephalosporin use.   . Adhesive [Tape] Other (See Comments)    blisters  . Cephalexin Swelling  . Ciprofloxacin Swelling  . Demerol Nausea And Vomiting  . Sulfamethoxazole-Trimethoprim Itching and Swelling    REACTION: swelling/hives  . Quinolones  Itching and Rash    REACTION: itching, rash Pt. Reports no problems with levaquin     MEDICATIONS:  Current Outpatient Prescriptions  Medication Sig Dispense Refill  . acetaminophen (TYLENOL) 500 MG tablet Take 1,000 mg by mouth daily as needed for moderate pain. Reported on 03/10/2016    . ALPRAZolam (XANAX) 0.25 MG tablet Take 1/2 tablet prior to radiation and 1 tablet prior to bedtime. 30 tablet 1  . aspirin 81 MG chewable tablet Chew 81 mg by mouth once a week. Reported on 03/10/2016    . CALCIUM-MAGNESIUM-VITAMIN D PO Take 1 tablet by  mouth 2 (two) times daily.    . cetirizine (ZYRTEC) 10 MG tablet Take 10 mg by mouth as needed for allergies.     Marland Kitchen doxepin (SINEQUAN) 10 MG capsule Take 10 mg by mouth at bedtime.     . fluticasone (FLONASE) 50 MCG/ACT nasal spray Place 1 spray into both nostrils daily as needed for allergies.     Marland Kitchen guaiFENesin (MUCINEX) 600 MG 12 hr tablet Take 600 mg by mouth daily as needed for cough or to loosen phlegm. Reported on 03/28/2016    . losartan (COZAAR) 25 MG tablet Take 25 mg by mouth daily.   0  . potassium chloride (K-DUR) 10 MEQ tablet Take 10 mEq by mouth daily.    . pseudoephedrine-acetaminophen (TYLENOL SINUS) 30-500 MG TABS tablet Take 1 tablet by mouth every 4 (four) hours as needed.    . tamoxifen (NOLVADEX) 20 MG tablet Take 1 tablet (20 mg total) by mouth daily. 90 tablet 1  . neomycin-polymyxin b-dexamethasone (MAXITROL) 3.5-10000-0.1 OINT     . nitroGLYCERIN (NITROSTAT) 0.4 MG SL tablet Place 1 tablet (0.4 mg total) under the tongue every 5 (five) minutes as needed for chest pain. <PLEASE MAKE APPOINTMENT> (Patient not taking: Reported on 09/19/2016) 25 tablet 3   No current facility-administered medications for this visit.       REVIEW OF SYSTEMS: A 10 point review of systems was completed and is negative except as noted above.   Health Maintenance  Mammogram: 10/19/2015   PHYSICAL EXAMINATION: BP 136/84 (BP Location: Left Arm,  Patient Position: Sitting)   Pulse 72   Temp 97.7 F (36.5 C) (Oral)   Resp 17   Ht 5' 1"  (1.549 m)   Wt 123 lb 9.6 oz (56.1 kg)   SpO2 99%   BMI 23.35 kg/m   GENERAL: Patient is a well appearing female in no acute distress HEENT:  Sclerae anicteric.  Oropharynx clear and moist. No ulcerations or evidence of oropharyngeal candidiasis. Neck is supple.  NODES:   There is no cervical , supraclavicular, or axillary lymphadenopathy palpated.  BREAST EXAM:  Left breast is surgically absent,  incision in the left frontal chest has healed well. A few tiny nodules along the incision line, nontender. Right breast and axillas no masses or nodules.   LUNGS:  Clear to auscultation bilaterally.  No wheezes or rhonchi. HEART:  Regular rate and rhythm. No murmur appreciated. ABDOMEN:  Soft, nontender.  Positive, normoactive bowel sounds. No organomegaly palpated. MSK:  No focal spinal tenderness to palpation. Full range of motion bilaterally in the upper extremities. EXTREMITIES:  No peripheral edema.   SKIN:  Clear with no obvious rashes or skin changes. No nail dyscrasia. NEURO:  Nonfocal. Well oriented.  Appropriate affect.  ECOG FS:  Grade 1 - Symptomatic but completely ambulatory   LAB RESULTS: CBC Latest Ref Rng & Units 09/19/2016 07/27/2016 07/11/2016  WBC 3.9 - 10.3 10e3/uL 4.1 4.0 4.0  Hemoglobin 11.6 - 15.9 g/dL 12.8 13.6 13.6  Hematocrit 34.8 - 46.6 % 38.4 42.5 40.9  Platelets 145 - 400 10e3/uL 113(L) 124(L) 128(L)      CMP Latest Ref Rng & Units 09/19/2016 07/27/2016 07/11/2016  Glucose 70 - 140 mg/dl 88 95 67(L)  BUN 7.0 - 26.0 mg/dL 21.9 14 15.9  Creatinine 0.6 - 1.1 mg/dL 1.0 0.95 0.9  Sodium 136 - 145 mEq/L 141 143 144  Potassium 3.5 - 5.1 mEq/L 4.5 4.2 4.1  Chloride 101 - 111 mmol/L - 108 -  CO2  22 - 29 mEq/L 28 27 26   Calcium 8.4 - 10.4 mg/dL 9.1 9.4 9.4  Total Protein 6.4 - 8.3 g/dL 6.3(L) 6.5 6.4  Total Bilirubin 0.20 - 1.20 mg/dL 0.47 0.8 0.73  Alkaline Phos 40 - 150 U/L  57 47 67  AST 5 - 34 U/L 18 23 20   ALT 0 - 55 U/L 11 12(L) 14   PATHOLOGY REPORT Diagnosis 11/25/2015 Soft tissue mass, simple excision, Left chest wall - INVASIVE LOBULAR CARCINOMA, PRESENT AT NON ORIENTED TISSUE EDGE(S). - SEE COMMENT. Microscopic Comment The carcinoma appears grade 2. A breast prognostic profile will be performed and the results reported separately. (JDP:gt, 11/26/15).  Results: IMMUNOHISTOCHEMICAL AND MORPHOMETRIC ANALYSIS PERFORMED MANUALLY Estrogen Receptor: 95%, POSITIVE, STRONG STAINING INTENSITY Progesterone Receptor: 60%, POSITIVE, STRONG STAINING INTENSITY Results: HER2 - NEGATIVE RATIO OF HER2/CEP17 SIGNALS 1.07 AVERAGE HER2 COPY NUMBER PER CELL 2.90  Diagnosis 07/17/2016 1. Soft tissue mass, simple excision, Chest wall medial left - INVASIVE LOBULAR CARCINOMA, SEE COMMENT. - NODULE OF FIBROSIS AND CALCIFICATION. 2. Soft tissue mass, simple excision, Chest wall lateral left - INVASIVE LOBULAR CARCINOMA, SEE COMMENT. Microscopic Comment 1. -2. Both parts reveal foci of invasive lobular carcinoma. There is only limited tumor in part #1. Tumor does extend to the uninked edges of both parts. The carcinoma appears grade 2. A prognostic panel has been ordered on part #2.  2. PROGNOSTIC INDICATORS Results: IMMUNOHISTOCHEMICAL AND MORPHOMETRIC ANALYSIS PERFORMED MANUALLY Estrogen Receptor: 60%, POSITIVE, STRONG STAINING INTENSITY Progesterone Receptor: 90%, POSITIVE, STRONG STAINING INTENSITY Proliferation Marker Ki67: 10%  Results: HER2 - NEGATIVE RATIO OF HER2/CEP17 SIGNALS 1.20 AVERAGE HER2 COPY NUMBER PER CELL 2.40   RADIOGRAPHIC STUDIES: CT chest, abdomen and pelvis wo contrast  07/27/2016 IMPRESSION: Sigmoid diverticulosis without acute diverticulitis. No bowel obstruction or acute inflammation.  Degenerative disc disease and facet arthropathy of the lower lumbar spine with slight grade 1 anterolisthesis of L4 on L5.  Stable appearing  simple left-sided renal cysts.  PET 07/14/2016 IMPRESSION: 1. No findings of recurrent malignancy. 2. Radiation fibrosis in the lingula. A small left axillary lymph node is not hypermetabolic and only 6 mm in diameter. This is in the vicinity of the prior axillary cyst. 3. Pelvic floor laxity.   ASSESSMENT/PLAN: 82 y.o. with:  1. Stage IIB, T2 N1 invasive lobular carcinoma, grade 2, ER 95%, PR 97%, Ki-67 17%, HER-2/neu no amplification, diagnosed in 07/2010, local chest wall recurrence in 09/2015   -She was on adjuvant tamoxifen for 4.5 years, stopped on her own. -She unfortunately developed local recurrence 8 months after she stopped tamoxifen. -I previously reviewed her surgical pathology findings, which is consistent with lobular carcinoma recurrence from her previous breast cancer. -The tumor was surgically removed, with positive margins. -Her restaging CT scan and bone scan in 12/2015 was negative for distant metastasis. -She has completed chest wall radiation, however developed 2-3 small during the radiation and anastrozole therapy, supicious for residual cancer. US showed a 4 mm and 6 mm subcutaneous nodule, partially calcified, highly suspicious for local recurrence. -She subsequently underwent a second chest wall surgery for two soft tissue resection, I reviewed her pathology results, both showed invasive lobular carcinoma, margins were positive. I spoke with Dr. Marlou Starks, no more surgery will be offered, because complete surgical resection is unlikely. Patient is also not in favor of more extensive surgery. -We discussed her that her cancer is unlikely curable at this stage, and our goal of therapy is palliative for disease control, and prolong her life. She voiced  good understanding. -I have changed her endocrine therapy to tamoxifen, she is tolerating very well, we'll continue indefinitely. -Her last PET scan and a CT abdomen and pelvis in September 2017 were negative for metastatic  disease -If she is clinically doing well, I'll plan to repeat scan in 6-12  months.  2. HTN, CAD  -She'll continue follow-up with her primary care physician and cardiologist  3. Left shoulder blade pain -Chronic, intermittent, mild, for the past year. Prior bone scan was negative -Possible muscular related, we'll continue monitoring   Plan -Continue tamoxifen indefinitely -I'll see her back in 2 months for follow-up  I spent 20 minutes counseling the patient face to face.  The total time spent in the appointment was 25 minutes.  Gloria [name]  09/19/2016

## 2016-09-26 DIAGNOSIS — M25511 Pain in right shoulder: Secondary | ICD-10-CM | POA: Diagnosis not present

## 2016-09-26 DIAGNOSIS — I10 Essential (primary) hypertension: Secondary | ICD-10-CM | POA: Diagnosis not present

## 2016-09-26 DIAGNOSIS — J029 Acute pharyngitis, unspecified: Secondary | ICD-10-CM | POA: Diagnosis not present

## 2016-10-12 ENCOUNTER — Encounter: Payer: Self-pay | Admitting: *Deleted

## 2016-11-17 NOTE — Progress Notes (Signed)
[place]  [phone] Fax:([phone]  OFFICE PROGRESS NOTE  PATIENT: Gloria [name]   DOB: 10-Feb-1928  MR#: [id number]  JYN#:[id number]  QM:[id number],BMW [name], MD [name], MD [name]. [name], MD  DIAGNOSIS:  An 83 y.o. [place] woman with invasive lobular carcinoma of the left breast diagnosed in 07/2010, chest wall local recurrence in 09/2015 .  Oncology History   Cancer of central portion of left female breast [facility])   Staging form: Breast, AJCC 7th Edition     Pathologic stage from 08/04/2010: Stage IIB (T2, N1a, cM0) - Signed by Truitt Merle, MD on 12/09/2015       Cancer of central portion of left female breast ([place])   08/03/2010 Mammogram     left nipple retraction, and the palpable mass subareolar left breast at the 6:00 position. Ultrasound confirmed the presence of a mass measuring 2.6 x 1.7 x 2.4 cm      08/04/2010 Initial Biopsy    left breast mass biopsy showed an invasive mammary carcinoma with lobular features. ER 95%, PR 97%, Ki-67 17%, HER-2/neu (-)      08/04/2010 Pathologic Stage    Stage IIB: T2 N1a      08/11/2010 Imaging    left breast mass 4.6 x 4.2 x 2.3 cm with enhancement distortion extending to the nipple retraction. There is questionable anterior mediastinal lymph node seen      07/2010 - 09/03/2010 Anti-estrogen oral therapy    neoadjuvant letrozole       09/03/2010 Surgery     left breast mastectomy with sentinel node biopsy on 09/03/2010.      08/2010 - 01/2015 Anti-estrogen oral therapy    Tamoxifen, pt stopped on her own       12/03/2010 - 01/23/2011 Radiation Therapy    left chest wall and axilla radiaiton       10/19/2015 Progression    Left chest wall recurrence, s/p resection on 11/25/2015 with positive margins ([facility])       12/10/2015 -  Anti-estrogen oral therapy    anastrozole 36m daily, which was switched to Tamoxifen on 06/30/2016 due to persistent disease.      01/03/2016 - 03/03/2016 Radiation  Therapy    Radiation to left chest wall recurrence (12/06/2015-02/04/2016 - 44 Gy), pt progressed through the radiation, and had additional 10 fractions of radiation (02/21/16-03/03/2016 - total 70 Gy to the left chest wall)      07/14/2016 PET scan    PET 07/14/2016 IMPRESSION: 1. No findings of recurrent malignancy. 2. Radiation fibrosis in the lingula. A small left axillary lymph node is not hypermetabolic and only 6 mm in diameter. This is in the vicinity of the prior axillary cyst. 3. Pelvic floor laxity.      07/17/2016 Surgery    Chest wall soft tissue resection for local recurrence      07/17/2016 Pathology Results    Left chest wall two soft tissue resection both showed invasive lobular carcinoma, margins were positive. ER 95% positive, PR 60% positive, HER-2 negative.      07/27/2016 Imaging    CT chest, abdomen and pelvis wo contrast  07/27/2016 IMPRESSION: Sigmoid diverticulosis without acute diverticulitis. No bowel obstruction or acute inflammation. Degenerative disc disease and facet arthropathy of the lower lumbar spine with slight grade 1 anterolisthesis of L4 on L5. Stable appearing simple left-sided renal cysts.        CURRENT THERAPY:  Tamoxifen 232mdaily    INTERVAL HISTORY:  SaAra  returns for follow-up. She is accompanied by her daughter to my clinic today. The patient states she doing "ok." She has a rash in the right breast that itches and hurts. She is using triamcinolone cream for it and she states it helps. She reports occasional hot flashes that are manageable. She reports a good appetite. She reports some more fatigue, but is able to complete her normal activities. She denies pain. She reports sneezing more in the past 2 weeks for which she takes tylenol sinus. She denies cough.   PAST MEDICAL HISTORY: Past Medical History:  Diagnosis Date  . Anxiety   . Blood in urine   . CAD (coronary artery disease)    non obstructive by cath  . Cancer [facility])    breast-  left  . Depression   . GERD (gastroesophageal reflux disease)   . Hypertension   . Knee fracture   . TR (tricuspid regurgitation)    mild by Echo 12/2008 EF >55%    PAST SURGICAL HISTORY: Past Surgical History:  Procedure Laterality Date  . ABDOMINAL AORTAGRAM N/A 09/08/2011   Procedure: ABDOMINAL Maxcine Ham;  Surgeon: Troy Sine, MD;  Location: [facility] CATH LAB;  Service: Cardiovascular;  Laterality: N/A;  . ABDOMINAL HYSTERECTOMY    . BREAST SURGERY  08-30-10   mastectomy  . CARDIAC CATHETERIZATION  08/2011   20% LAD stenosis, 20% diagonal stenosis, 10-20% proximal dominant RCA stenosis  . COLON SURGERY    . LEFT HEART CATHETERIZATION WITH CORONARY ANGIOGRAM N/A 09/08/2011   Procedure: LEFT HEART CATHETERIZATION WITH CORONARY ANGIOGRAM;  Surgeon: Troy Sine, MD;  Location: [facility] CATH LAB;  Service: Cardiovascular;  Laterality: N/A;  . MINOR BREAST BIOPSY Left 11/25/2015   Procedure: MINOR EXCISION MASS LEFT CHEST WALL;  Surgeon: Autumn Messing III, MD;  Location: [place];  Service: General;  Laterality: Left;  . MINOR BREAST BIOPSY Left 07/17/2016   Procedure: EXCISION OF 2 LEFT CHEST WALL MASSES;  Surgeon: Autumn Messing III, MD;  Location: [place];  Service: General;  Laterality: Left;  . TONSILLECTOMY      FAMILY HISTORY: Family History  Problem Relation Age of Onset  . Cancer Father     prostate  . Cancer Sister     breast  . Heart disease Sister   . Cancer Brother     pancreatic  . Cancer Maternal Grandmother     colon  . Dementia Mother   . Sudden death Brother   . Heart attack Brother   . Heart attack Brother   . Arthritis Sister     SOCIAL HISTORY: Social History  Substance Use Topics  . Smoking status: Former Smoker    Quit date: 11/15/1991  . Smokeless tobacco: Never Used  . Alcohol use 1.2 oz/week    2 Glasses of wine per week     Comment: 1 glass per week    ALLERGIES: Allergies  Allergen Reactions  . Contrast Media  [Iodinated Diagnostic Agents] Anaphylaxis  . Penicillins Anaphylaxis and Swelling    Has patient had a PCN reaction causing immediate rash, facial/tongue/throat swelling, SOB or lightheadedness with hypotension: Yes Has patient had a PCN reaction causing severe rash involving mucus membranes or skin necrosis: No Has patient had a PCN reaction that required hospitalization Yes Has patient had a PCN reaction occurring within the last 10 years: No If all of the above answers are "NO", then may proceed with Cephalosporin use.   . Adhesive [Tape] Other (See Comments)  blisters  . Cephalexin Swelling  . Ciprofloxacin Swelling  . Demerol Nausea And Vomiting  . Sulfamethoxazole-Trimethoprim Itching and Swelling    REACTION: swelling/hives  . Quinolones Itching and Rash    REACTION: itching, rash Pt. Reports no problems with levaquin     MEDICATIONS:  Current Outpatient Prescriptions  Medication Sig Dispense Refill  . acetaminophen (TYLENOL) 500 MG tablet Take 1,000 mg by mouth daily as needed for moderate pain. Reported on 03/10/2016    . ALPRAZolam (XANAX) 0.25 MG tablet Take 1/2 tablet prior to radiation and 1 tablet prior to bedtime. 30 tablet 1  . aspirin 81 MG chewable tablet Chew 81 mg by mouth once a week. Reported on 03/10/2016    . CALCIUM-MAGNESIUM-VITAMIN D PO Take 1 tablet by mouth 2 (two) times daily.    . cetirizine (ZYRTEC) 10 MG tablet Take 10 mg by mouth as needed for allergies.     Marland Kitchen doxepin (SINEQUAN) 10 MG capsule Take 10 mg by mouth at bedtime.     . fluticasone (FLONASE) 50 MCG/ACT nasal spray Place 1 spray into both nostrils daily as needed for allergies.     Marland Kitchen guaiFENesin (MUCINEX) 600 MG 12 hr tablet Take 600 mg by mouth daily as needed for cough or to loosen phlegm. Reported on 03/28/2016    . losartan (COZAAR) 25 MG tablet Take 25 mg by mouth daily.   0  . nitroGLYCERIN (NITROSTAT) 0.4 MG SL tablet Place 1 tablet (0.4 mg total) under the tongue every 5 (five)  minutes as needed for chest pain. <PLEASE MAKE APPOINTMENT> 25 tablet 3  . potassium chloride (K-DUR) 10 MEQ tablet Take 10 mEq by mouth daily.    . pseudoephedrine-acetaminophen (TYLENOL SINUS) 30-500 MG TABS tablet Take 1 tablet by mouth every 4 (four) hours as needed.    . tamoxifen (NOLVADEX) 20 MG tablet Take 1 tablet (20 mg total) by mouth daily. 90 tablet 1  . triamcinolone (KENALOG) 0.025 % cream Apply 1 application topically 2 (two) times daily. 30 g 0   No current facility-administered medications for this visit.       REVIEW OF SYSTEMS: A 10 point review of systems was completed and is negative except as noted above.   Health Maintenance  Mammogram: 10/19/2015   PHYSICAL EXAMINATION: BP 125/77 (BP Location: Left Arm, Patient Position: Sitting)   Pulse 76   Temp 97.6 F (36.4 C) (Oral)   Resp 18   Ht _0  (1.549 m)   Wt 125 lb 1.6 oz (56.7 kg)   SpO2 100%   BMI 23.64 kg/m   GENERAL: Patient is a well appearing female in no acute distress HEENT:  Sclerae anicteric.  Oropharynx clear and moist. No ulcerations or evidence of oropharyngeal candidiasis. Neck is supple.  NODES:   There is no cervical , supraclavicular, or axillary lymphadenopathy palpated.  LUNGS:  Clear to auscultation bilaterally.  No wheezes or rhonchi. HEART:  Regular rate and rhythm. No murmur appreciated. ABDOMEN:  Soft, nontender.  Positive, normoactive bowel sounds. No organomegaly palpated. MSK:  No focal spinal tenderness to palpation. Full range of motion bilaterally in the upper extremities. EXTREMITIES:  No peripheral edema.   SKIN:  Clear with no obvious rashes or skin changes. No nail dyscrasia. NEURO:  Nonfocal. Well oriented.  Appropriate affect. BREAST EXAM:  Left breast is surgically absent,  incision in the left frontal chest has healed well. Small nodularity along the incision line, nontender. Right breast and bilateral axilla notable for no  masses or nodules. Some erythema in the  right areola area without skin ulceration or discharge.   ECOG FS:  Grade 1 - Symptomatic but completely ambulatory   LAB RESULTS: CBC Latest Ref Rng & Units 11/20/2016 09/19/2016 07/27/2016  WBC 3.9 - 10.3 10e3/uL 5.0 4.1 4.0  Hemoglobin 11.6 - 15.9 g/dL 13.1 12.8 13.6  Hematocrit 34.8 - 46.6 % 39.3 38.4 42.5  Platelets 145 - 400 10e3/uL 125(L) 113(L) 124(L)      CMP Latest Ref Rng & Units 11/20/2016 09/19/2016 07/27/2016  Glucose 70 - 140 mg/dl 99 88 95  BUN 7.0 - 26.0 mg/dL 18.3 21.9 14  Creatinine 0.6 - 1.1 mg/dL 1.0 1.0 0.95  Sodium 136 - 145 mEq/L 140 141 143  Potassium 3.5 - 5.1 mEq/L 4.1 4.5 4.2  Chloride 101 - 111 mmol/L - - 108  CO2 22 - 29 mEq/L _0 Calcium 8.4 - 10.4 mg/dL 9.2 9.1 9.4  Total Protein 6.4 - 8.3 g/dL 6.3(L) 6.3(L) 6.5  Total Bilirubin 0.20 - 1.20 mg/dL 0.58 0.47 0.8  Alkaline Phos 40 - 150 U/L 51 57 47  AST 5 - 34 U/L _1 ALT 0 - 55 U/L 9 11 12(L)   PATHOLOGY REPORT Diagnosis 11/25/2015 Soft tissue mass, simple excision, Left chest wall - INVASIVE LOBULAR CARCINOMA, PRESENT AT NON ORIENTED TISSUE EDGE(S). - SEE COMMENT. Microscopic Comment The carcinoma appears grade 2. A breast prognostic profile will be performed and the results reported separately. (JDP:gt, 11/26/15).  Results: IMMUNOHISTOCHEMICAL AND MORPHOMETRIC ANALYSIS PERFORMED MANUALLY Estrogen Receptor: 95%, POSITIVE, STRONG STAINING INTENSITY Progesterone Receptor: 60%, POSITIVE, STRONG STAINING INTENSITY Results: HER2 - NEGATIVE RATIO OF HER2/CEP17 SIGNALS 1.07 AVERAGE HER2 COPY NUMBER PER CELL 2.90  Diagnosis 07/17/2016 1. Soft tissue mass, simple excision, Chest wall medial left - INVASIVE LOBULAR CARCINOMA, SEE COMMENT. - NODULE OF FIBROSIS AND CALCIFICATION. 2. Soft tissue mass, simple excision, Chest wall lateral left - INVASIVE LOBULAR CARCINOMA, SEE COMMENT. Microscopic Comment 1. -2. Both parts reveal foci of invasive lobular carcinoma. There is only limited  tumor in part #1. Tumor does extend to the uninked edges of both parts. The carcinoma appears grade 2. A prognostic panel has been ordered on part #2.  2. PROGNOSTIC INDICATORS Results: IMMUNOHISTOCHEMICAL AND MORPHOMETRIC ANALYSIS PERFORMED MANUALLY Estrogen Receptor: 60%, POSITIVE, STRONG STAINING INTENSITY Progesterone Receptor: 90%, POSITIVE, STRONG STAINING INTENSITY Proliferation Marker Ki67: 10%  Results: HER2 - NEGATIVE RATIO OF HER2/CEP17 SIGNALS 1.20 AVERAGE HER2 COPY NUMBER PER CELL 2.40   RADIOGRAPHIC STUDIES: CT chest, abdomen and pelvis wo contrast  07/27/2016 IMPRESSION: Sigmoid diverticulosis without acute diverticulitis. No bowel obstruction or acute inflammation. Degenerative disc disease and facet arthropathy of the lower lumbar spine with slight grade 1 anterolisthesis of L4 on L5. Stable appearing simple left-sided renal cysts.  PET 07/14/2016 IMPRESSION: 1. No findings of recurrent malignancy. 2. Radiation fibrosis in the lingula. A small left axillary lymph node is not hypermetabolic and only 6 mm in diameter. This is in the vicinity of the prior axillary cyst. 3. Pelvic floor laxity.   ASSESSMENT/PLAN: 83 y.o. with:  1. Stage IIB, T2 N1 invasive lobular carcinoma, grade 2, ER 95%, PR 97%, Ki-67 17%, HER-2/neu no amplification, diagnosed in 07/2010, local chest wall recurrence in 09/2015   -She was on adjuvant tamoxifen for 4.5 years, stopped on her own. -She unfortunately developed local recurrence 8 months after she stopped tamoxifen. -I previously reviewed her surgical pathology findings, which is consistent with lobular carcinoma recurrence from  her previous breast cancer. -The tumor was surgically removed, with positive margins. -Her restaging CT scan and bone scan in 12/2015 was negative for distant metastasis. -She has completed chest wall radiation, however developed 2-3 small nodules during the radiation and anastrozole therapy, supicious for  residual cancer. US showed a 4 mm and 6 mm subcutaneous nodule, partially calcified, highly suspicious for local recurrence. -She subsequently underwent a second chest wall surgery for two soft tissue resection, I previously reviewed her pathology results, both showed invasive lobular carcinoma, margins were positive. I spoke with Dr. Marlou Starks, no more surgery will be offered, because complete surgical resection is unlikely. Patient is also not in favor of more extensive surgery. -We previously discussed her that her cancer is unlikely curable at this stage, and our goal of therapy is palliative for disease control, and prolong her life. She voiced good understanding. -I have changed her endocrine therapy to Tamoxifen, she is tolerating very well, we'll continue indefinitely. -Her last PET scan and CT abdomen and pelvis in September 2017 were negative for metastatic disease -If she is clinically doing well, I'll plan to repeat scan in Sep 2018  -She is clinically doing well, exam was unremarkable, lab reviewed with her, no clinical concern for metastatic disease for now. -She has mild skin erythema in the right perioral area, no palpable mass, her last right breast mammogram was in December 2016. She is overdue, well repeat annual right breast mammogram this month   2. HTN, CAD  -She'll continue follow-up with her primary care physician and cardiologist  3. Left shoulder blade pain -Chronic, intermittent, mild, for the past year. Prior bone scan was negative -Possible muscular related, we'll continue monitoring -near resolved lately   4. Right breast erythema -The patient has erythema around the areola of the right breast. I will prescribe Triamcinolone 0.025% cream for this.  Plan -Continue tamoxifen indefinitely -right breast mammogram this month  -Labs and follow up in 2 months. -I have filled Triamcinolone for the patient's right breast erythema.  I spent 20 minutes counseling the patient  face to face.  The total time spent in the appointment was 25 minutes.  Truitt Merle  11/20/2016   This document serves as a record of services personally performed by Truitt Merle, MD. It was created on her behalf by Darcus Austin, a trained medical scribe. The creation of this record is based on the scribe's personal observations and the provider's statements to them. This document has been checked and approved by the attending provider.

## 2016-11-20 ENCOUNTER — Ambulatory Visit (HOSPITAL_BASED_OUTPATIENT_CLINIC_OR_DEPARTMENT_OTHER): Payer: PPO | Admitting: Hematology

## 2016-11-20 ENCOUNTER — Encounter: Payer: Self-pay | Admitting: Hematology

## 2016-11-20 ENCOUNTER — Telehealth: Payer: Self-pay | Admitting: Hematology

## 2016-11-20 ENCOUNTER — Other Ambulatory Visit (HOSPITAL_BASED_OUTPATIENT_CLINIC_OR_DEPARTMENT_OTHER): Payer: PPO

## 2016-11-20 VITALS — BP 125/77 | HR 76 | Temp 97.6°F | Resp 18 | Ht 61.0 in | Wt 125.1 lb

## 2016-11-20 DIAGNOSIS — C44501 Unspecified malignant neoplasm of skin of breast: Secondary | ICD-10-CM

## 2016-11-20 DIAGNOSIS — C50912 Malignant neoplasm of unspecified site of left female breast: Secondary | ICD-10-CM

## 2016-11-20 DIAGNOSIS — Z170 Estrogen receptor positive status [ER+]: Secondary | ICD-10-CM

## 2016-11-20 DIAGNOSIS — C50112 Malignant neoplasm of central portion of left female breast: Secondary | ICD-10-CM | POA: Diagnosis not present

## 2016-11-20 DIAGNOSIS — L539 Erythematous condition, unspecified: Secondary | ICD-10-CM | POA: Diagnosis not present

## 2016-11-20 DIAGNOSIS — I2510 Atherosclerotic heart disease of native coronary artery without angina pectoris: Secondary | ICD-10-CM

## 2016-11-20 DIAGNOSIS — I10 Essential (primary) hypertension: Secondary | ICD-10-CM

## 2016-11-20 DIAGNOSIS — M25512 Pain in left shoulder: Secondary | ICD-10-CM

## 2016-11-20 LAB — CBC WITH DIFFERENTIAL/PLATELET
BASO%: 1.2 % (ref 0.0–2.0)
BASOS ABS: 0.1 10*3/uL (ref 0.0–0.1)
EOS%: 5.2 % (ref 0.0–7.0)
Eosinophils Absolute: 0.3 10*3/uL (ref 0.0–0.5)
HEMATOCRIT: 39.3 % (ref 34.8–46.6)
HGB: 13.1 g/dL (ref 11.6–15.9)
LYMPH%: 13.7 % — AB (ref 14.0–49.7)
MCH: 31.5 pg (ref 25.1–34.0)
MCHC: 33.3 g/dL (ref 31.5–36.0)
MCV: 94.5 fL (ref 79.5–101.0)
MONO#: 0.3 10*3/uL (ref 0.1–0.9)
MONO%: 6.9 % (ref 0.0–14.0)
NEUT#: 3.7 10*3/uL (ref 1.5–6.5)
NEUT%: 73 % (ref 38.4–76.8)
Platelets: 125 10*3/uL — ABNORMAL LOW (ref 145–400)
RBC: 4.16 10*6/uL (ref 3.70–5.45)
RDW: 13 % (ref 11.2–14.5)
WBC: 5 10*3/uL (ref 3.9–10.3)
lymph#: 0.7 10*3/uL — ABNORMAL LOW (ref 0.9–3.3)

## 2016-11-20 LAB — COMPREHENSIVE METABOLIC PANEL
ALT: 9 U/L (ref 0–55)
AST: 19 U/L (ref 5–34)
Albumin: 3.6 g/dL (ref 3.5–5.0)
Alkaline Phosphatase: 51 U/L (ref 40–150)
Anion Gap: 7 mEq/L (ref 3–11)
BUN: 18.3 mg/dL (ref 7.0–26.0)
CALCIUM: 9.2 mg/dL (ref 8.4–10.4)
CHLORIDE: 107 meq/L (ref 98–109)
CO2: 26 meq/L (ref 22–29)
Creatinine: 1 mg/dL (ref 0.6–1.1)
EGFR: 54 mL/min/{1.73_m2} — ABNORMAL LOW (ref >90)
Glucose: 99 mg/dl (ref 70–140)
POTASSIUM: 4.1 meq/L (ref 3.5–5.1)
SODIUM: 140 meq/L (ref 136–145)
Total Bilirubin: 0.58 mg/dL (ref 0.20–1.20)
Total Protein: 6.3 g/dL — ABNORMAL LOW (ref 6.4–8.3)

## 2016-11-20 MED ORDER — TRIAMCINOLONE ACETONIDE 0.025 % EX CREA: 1.0000 "application " | TOPICAL_CREAM | Freq: Two times a day (BID) | CUTANEOUS | 0 refills | Status: DC

## 2016-11-20 NOTE — Telephone Encounter (Signed)
Appointments scheduled per 1/22 LOS. Patient given AVS report and calendars with future scheduled appointments.  °

## 2016-11-21 LAB — CANCER ANTIGEN 27.29: CA 27.29: 28.8 U/mL (ref 0.0–38.6)

## 2016-11-24 ENCOUNTER — Other Ambulatory Visit: Payer: Self-pay | Admitting: Hematology

## 2016-11-24 DIAGNOSIS — Z1231 Encounter for screening mammogram for malignant neoplasm of breast: Secondary | ICD-10-CM

## 2016-11-24 DIAGNOSIS — Z9012 Acquired absence of left breast and nipple: Secondary | ICD-10-CM

## 2016-11-27 DIAGNOSIS — R300 Dysuria: Secondary | ICD-10-CM | POA: Diagnosis not present

## 2016-11-28 DIAGNOSIS — H35043 Retinal micro-aneurysms, unspecified, bilateral: Secondary | ICD-10-CM | POA: Diagnosis not present

## 2016-11-28 DIAGNOSIS — H43392 Other vitreous opacities, left eye: Secondary | ICD-10-CM | POA: Diagnosis not present

## 2016-11-28 DIAGNOSIS — H348322 Tributary (branch) retinal vein occlusion, left eye, stable: Secondary | ICD-10-CM | POA: Diagnosis not present

## 2016-11-28 DIAGNOSIS — H43811 Vitreous degeneration, right eye: Secondary | ICD-10-CM | POA: Diagnosis not present

## 2016-11-28 DIAGNOSIS — H3562 Retinal hemorrhage, left eye: Secondary | ICD-10-CM | POA: Diagnosis not present

## 2016-12-07 DIAGNOSIS — H6123 Impacted cerumen, bilateral: Secondary | ICD-10-CM | POA: Diagnosis not present

## 2016-12-07 DIAGNOSIS — J309 Allergic rhinitis, unspecified: Secondary | ICD-10-CM | POA: Diagnosis not present

## 2016-12-07 DIAGNOSIS — J0100 Acute maxillary sinusitis, unspecified: Secondary | ICD-10-CM | POA: Diagnosis not present

## 2016-12-11 ENCOUNTER — Emergency Department (HOSPITAL_COMMUNITY)
Admission: EM | Admit: 2016-12-11 | Discharge: 2016-12-11 | Disposition: A | Discharge status: Home / Self Care | Payer: PPO | Attending: Emergency Medicine | Admitting: Emergency Medicine

## 2016-12-11 ENCOUNTER — Emergency Department (HOSPITAL_COMMUNITY): Payer: PPO

## 2016-12-11 ENCOUNTER — Encounter (HOSPITAL_COMMUNITY): Payer: Self-pay

## 2016-12-11 DIAGNOSIS — I10 Essential (primary) hypertension: Secondary | ICD-10-CM | POA: Diagnosis not present

## 2016-12-11 DIAGNOSIS — R69 Illness, unspecified: Secondary | ICD-10-CM

## 2016-12-11 DIAGNOSIS — Z87891 Personal history of nicotine dependence: Secondary | ICD-10-CM | POA: Insufficient documentation

## 2016-12-11 DIAGNOSIS — J029 Acute pharyngitis, unspecified: Secondary | ICD-10-CM | POA: Diagnosis not present

## 2016-12-11 DIAGNOSIS — J111 Influenza due to unidentified influenza virus with other respiratory manifestations: Secondary | ICD-10-CM | POA: Diagnosis not present

## 2016-12-11 DIAGNOSIS — J45909 Unspecified asthma, uncomplicated: Secondary | ICD-10-CM | POA: Insufficient documentation

## 2016-12-11 DIAGNOSIS — Z853 Personal history of malignant neoplasm of breast: Secondary | ICD-10-CM | POA: Diagnosis not present

## 2016-12-11 DIAGNOSIS — Z7982 Long term (current) use of aspirin: Secondary | ICD-10-CM | POA: Diagnosis not present

## 2016-12-11 DIAGNOSIS — I2510 Atherosclerotic heart disease of native coronary artery without angina pectoris: Secondary | ICD-10-CM | POA: Diagnosis not present

## 2016-12-11 DIAGNOSIS — J069 Acute upper respiratory infection, unspecified: Secondary | ICD-10-CM | POA: Diagnosis not present

## 2016-12-11 DIAGNOSIS — R05 Cough: Secondary | ICD-10-CM | POA: Diagnosis not present

## 2016-12-11 MED ORDER — IBUPROFEN 200 MG PO TABS
200.0000 mg | ORAL_TABLET | Freq: Once | ORAL | Status: AC
  Administered 2016-12-11: 200 mg via ORAL
  Filled 2016-12-11: qty 1

## 2016-12-11 MED ORDER — ACETAMINOPHEN 325 MG PO TABS
650.0000 mg | ORAL_TABLET | Freq: Once | ORAL | Status: AC
  Administered 2016-12-11: 650 mg via ORAL
  Filled 2016-12-11: qty 2

## 2016-12-11 NOTE — ED Provider Notes (Signed)
[facility] DEPT Provider Note   CSN: [id number] Arrival date & time: 12/11/16  1225     History   Chief Complaint Chief Complaint  Patient presents with  . Sore Throat  . Otalgia    HPI Gloria [name] is a 83 y.o. female.  Patient c/o non productive cough, nasal congestion, sore throat, ear aches, for the past 5-6 days. States went to [employer] In last week, and has been taking doxycycline since.  Denies chest pain or sob. No trouble breathing or swallowing. Denies fever/chills/sweats. No abdominal pain. No vomiting or diarrhea. No gu c/o. No headache.    The history is provided by the patient.  Sore Throat  Pertinent negatives include no chest pain, no abdominal pain, no headaches and no shortness of breath.  Otalgia  Associated symptoms include rhinorrhea, sore throat and cough. Pertinent negatives include no headaches, no abdominal pain, no diarrhea, no vomiting, no neck pain and no rash.    Past Medical History:  Diagnosis Date  . Anxiety   . Blood in urine   . CAD (coronary artery disease)    non obstructive by cath  . Cancer [facility])    breast- left  . Depression   . GERD (gastroesophageal reflux disease)   . Hypertension   . Knee fracture   . TR (tricuspid regurgitation)    mild by Echo 12/2008 EF >55%    Patient Active Problem List   Diagnosis Date Noted  . Chest pain 03/15/2016  . Cancer of central portion of left female breast ([place]) 12/09/2015  . Bronchitis, chronic obstructive w acute bronchitis ([place]) 03/16/2014  . Bronchitis with bronchospasm 03/15/2014  . CAD (coronary artery disease), non obstructive  05/06/2013  . HYPERTENSION, BENIGN 12/23/2008  . HEMORRHOIDS 03/13/2008  . OTHER DYSPHAGIA 03/13/2008  . Asthma, intrinsic 10/30/2007  . G E R D 09/19/2007    Past Surgical History:  Procedure Laterality Date  . ABDOMINAL AORTAGRAM N/A 09/08/2011   Procedure: ABDOMINAL Maxcine Ham;  Surgeon: Troy Sine, MD;  Location: [facility] CATH LAB;  Service:  Cardiovascular;  Laterality: N/A;  . ABDOMINAL HYSTERECTOMY    . BREAST SURGERY  08-30-10   mastectomy  . CARDIAC CATHETERIZATION  08/2011   20% LAD stenosis, 20% diagonal stenosis, 10-20% proximal dominant RCA stenosis  . COLON SURGERY    . LEFT HEART CATHETERIZATION WITH CORONARY ANGIOGRAM N/A 09/08/2011   Procedure: LEFT HEART CATHETERIZATION WITH CORONARY ANGIOGRAM;  Surgeon: Troy Sine, MD;  Location: [facility] CATH LAB;  Service: Cardiovascular;  Laterality: N/A;  . MINOR BREAST BIOPSY Left 11/25/2015   Procedure: MINOR EXCISION MASS LEFT CHEST WALL;  Surgeon: Autumn Messing III, MD;  Location: [place];  Service: General;  Laterality: Left;  . MINOR BREAST BIOPSY Left 07/17/2016   Procedure: EXCISION OF 2 LEFT CHEST WALL MASSES;  Surgeon: Autumn Messing III, MD;  Location: [place];  Service: General;  Laterality: Left;  . TONSILLECTOMY      OB History    No data available       Home Medications    Prior to Admission medications   Medication Sig Start Date End Date Taking? Authorizing Provider  acetaminophen (TYLENOL) 500 MG tablet Take 1,000 mg by mouth daily as needed for moderate pain. Reported on 03/10/2016    Historical Provider, MD  ALPRAZolam Duanne Moron) 0.25 MG tablet Take 1/2 tablet prior to radiation and 1 tablet prior to bedtime. 01/25/16   [name], MD  aspirin 81 MG  chewable tablet Chew 81 mg by mouth once a week. Reported on 03/10/2016    Historical Provider, MD  CALCIUM-MAGNESIUM-VITAMIN D PO Take 1 tablet by mouth 2 (two) times daily.    Historical Provider, MD  cetirizine (ZYRTEC) 10 MG tablet Take 10 mg by mouth as needed for allergies.     Historical Provider, MD  doxepin (SINEQUAN) 10 MG capsule Take 10 mg by mouth at bedtime.     Historical Provider, MD  fluticasone (FLONASE) 50 MCG/ACT nasal spray Place 1 spray into both nostrils daily as needed for allergies.     Historical Provider, MD  guaiFENesin (MUCINEX) 600 MG 12 hr tablet Take 600  mg by mouth daily as needed for cough or to loosen phlegm. Reported on 03/28/2016    Historical Provider, MD  losartan (COZAAR) 25 MG tablet Take 25 mg by mouth daily.  02/05/15   Historical Provider, MD  nitroGLYCERIN (NITROSTAT) 0.4 MG SL tablet Place 1 tablet (0.4 mg total) under the tongue every 5 (five) minutes as needed for chest pain. <PLEASE MAKE APPOINTMENT> 02/25/16   Troy Sine, MD  potassium chloride (K-DUR) 10 MEQ tablet Take 10 mEq by mouth daily.    Historical Provider, MD  pseudoephedrine-acetaminophen (TYLENOL SINUS) 30-500 MG TABS tablet Take 1 tablet by mouth every 4 (four) hours as needed.    Historical Provider, MD  tamoxifen (NOLVADEX) 20 MG tablet Take 1 tablet (20 mg total) by mouth daily. 09/19/16   Truitt Merle, MD  triamcinolone (KENALOG) 0.025 % cream Apply 1 application topically 2 (two) times daily. 11/20/16   Truitt Merle, MD    Family History Family History  Problem Relation Age of Onset  . Cancer Father     prostate  . Cancer Sister     breast  . Heart disease Sister   . Cancer Brother     pancreatic  . Cancer Maternal Grandmother     colon  . Dementia Mother   . Sudden death Brother   . Heart attack Brother   . Heart attack Brother   . Arthritis Sister     Social History Social History  Substance Use Topics  . Smoking status: Former Smoker    Quit date: 11/15/1991  . Smokeless tobacco: Never Used  . Alcohol use 1.2 oz/week    2 Glasses of wine per week     Comment: 1 glass per week     Allergies   Contrast media [iodinated diagnostic agents]; Penicillins; Adhesive [tape]; Cephalexin; Ciprofloxacin; Demerol; Sulfamethoxazole-trimethoprim; and Quinolones   Review of Systems Review of Systems  Constitutional: Negative for fever.  HENT: Positive for congestion, ear pain, rhinorrhea and sore throat.   Eyes: Negative for redness.  Respiratory: Positive for cough. Negative for shortness of breath.   Cardiovascular: Negative for chest pain.    Gastrointestinal: Negative for abdominal pain, diarrhea and vomiting.  Genitourinary: Negative for dysuria.  Musculoskeletal: Negative for back pain, neck pain and neck stiffness.  Skin: Negative for rash.  Neurological: Negative for headaches.  Hematological: Does not bruise/bleed easily.  Psychiatric/Behavioral: Negative for confusion.     Physical Exam Updated Vital Signs BP 143/98 (BP Location: Left Arm)   Pulse 105   Temp 98.1 F (36.7 C) (Oral)   Resp 18   Wt 49 kg   SpO2 97%   BMI 20.41 kg/m   Physical Exam  Constitutional: She appears well-developed and well-nourished. No distress.  HENT:  Mouth/Throat: Oropharynx is clear and moist.  Nasal congestion/rhinorrhea. tms  w clear fluid, no erythema or pus.  No mastoid tenderness.   Eyes: Conjunctivae are normal. No scleral icterus.  Neck: Neck supple. No tracheal deviation present.  No stiffness or rigidity  Cardiovascular: Normal rate, regular rhythm, normal heart sounds and intact distal pulses.  Exam reveals no gallop and no friction rub.   No murmur heard. Pulmonary/Chest: Effort normal and breath sounds normal. No respiratory distress.  Abdominal: Soft. Normal appearance and bowel sounds are normal. She exhibits no distension. There is no tenderness.  Genitourinary:  Genitourinary Comments: No cva tenderness  Musculoskeletal: She exhibits no edema.  Lymphadenopathy:    She has no cervical adenopathy.  Neurological: She is alert.  Speech clear/fluent. Responds to questions appropriately.   Skin: Skin is warm and dry. No rash noted. She is not diaphoretic.  Psychiatric: She has a normal mood and affect.  Nursing note and vitals reviewed.    ED Treatments / Results  Labs (all labs ordered are listed, but only abnormal results are displayed) Labs Reviewed - No data to display  EKG  EKG Interpretation None       Radiology Dg Chest 2 View  Result Date: 12/11/2016 CLINICAL DATA:  Cough congestion for 1  week EXAM: CHEST  2 VIEW COMPARISON:  09/02/2015 FINDINGS: Radiation fibrosis in the lingula. There is no edema, consolidation, effusion, or pneumothorax. Status post left mastectomy and axillary dissection. Normal heart size and chronic aortic tortuosity. IMPRESSION: Stable post treatment chest.  No evidence of acute disease. Electronically Signed   By: Monte Fantasia M.D.   On: 12/11/2016 12:55    Procedures Procedures (including critical care time)  Medications Ordered in ED Medications  acetaminophen (TYLENOL) tablet 650 mg (not administered)  ibuprofen (ADVIL,MOTRIN) tablet 200 mg (not administered)     Initial Impression / Assessment and Plan / ED Course  I have reviewed the triage vital signs and the nursing notes.  Pertinent labs & imaging results that were available during my care of the patient were reviewed by me and considered in my medical decision making (see chart for details).  Chest xray negative acute.  Patient symptoms appear most c/w a viral or ILI.   Given symptoms present x 5-6 days, no benefit from tamiflu rx at this point.   Tylenol po. Po fluids.   No increased wob.  Pt currently appears stable for d/c.     Final Clinical Impressions(s) / ED Diagnoses   Final diagnoses:  None    New Prescriptions New Prescriptions   No medications on file     Lajean Saver, MD 12/11/16 1419

## 2016-12-11 NOTE — Discharge Instructions (Signed)
It was our pleasure to provide your ER care today - we hope that you feel better.  Rest. Drink adequate fluids.  Your symptoms appear most consistent with an influenza-like illness, which should run its course and get better in the next few days.  You may try an over the counter cold/flu medication as need for symptom relief.   Follow up with primary care doctor in 3-4 days if symptoms fail to improve/resolve.  Return to ER if worse, trouble breathing, other concern.

## 2016-12-11 NOTE — ED Triage Notes (Signed)
Pt went to [place] on Wednesday and given antibiotic.  Pt states earache off/on for a while.  Pt now with runny nose and sore throat.  No fever.  Some cough with congestion.

## 2016-12-17 ENCOUNTER — Emergency Department (HOSPITAL_COMMUNITY): Payer: PPO

## 2016-12-17 ENCOUNTER — Encounter (HOSPITAL_COMMUNITY): Payer: Self-pay | Admitting: Emergency Medicine

## 2016-12-17 ENCOUNTER — Emergency Department (HOSPITAL_COMMUNITY)
Admission: EM | Admit: 2016-12-17 | Discharge: 2016-12-17 | Disposition: A | Discharge status: Home / Self Care | Payer: PPO | Attending: Emergency Medicine | Admitting: Emergency Medicine

## 2016-12-17 DIAGNOSIS — Z853 Personal history of malignant neoplasm of breast: Secondary | ICD-10-CM | POA: Diagnosis not present

## 2016-12-17 DIAGNOSIS — R059 Cough, unspecified: Secondary | ICD-10-CM

## 2016-12-17 DIAGNOSIS — I10 Essential (primary) hypertension: Secondary | ICD-10-CM | POA: Diagnosis not present

## 2016-12-17 DIAGNOSIS — R05 Cough: Secondary | ICD-10-CM | POA: Diagnosis not present

## 2016-12-17 DIAGNOSIS — J069 Acute upper respiratory infection, unspecified: Secondary | ICD-10-CM | POA: Insufficient documentation

## 2016-12-17 DIAGNOSIS — Z7982 Long term (current) use of aspirin: Secondary | ICD-10-CM | POA: Insufficient documentation

## 2016-12-17 DIAGNOSIS — I2510 Atherosclerotic heart disease of native coronary artery without angina pectoris: Secondary | ICD-10-CM | POA: Diagnosis not present

## 2016-12-17 DIAGNOSIS — Z955 Presence of coronary angioplasty implant and graft: Secondary | ICD-10-CM | POA: Insufficient documentation

## 2016-12-17 DIAGNOSIS — R0689 Other abnormalities of breathing: Secondary | ICD-10-CM | POA: Insufficient documentation

## 2016-12-17 DIAGNOSIS — Z87891 Personal history of nicotine dependence: Secondary | ICD-10-CM | POA: Insufficient documentation

## 2016-12-17 DIAGNOSIS — Z79899 Other long term (current) drug therapy: Secondary | ICD-10-CM | POA: Diagnosis not present

## 2016-12-17 DIAGNOSIS — J45909 Unspecified asthma, uncomplicated: Secondary | ICD-10-CM | POA: Diagnosis not present

## 2016-12-17 DIAGNOSIS — H6981 Other specified disorders of Eustachian tube, right ear: Secondary | ICD-10-CM | POA: Diagnosis not present

## 2016-12-17 DIAGNOSIS — R0981 Nasal congestion: Secondary | ICD-10-CM | POA: Diagnosis not present

## 2016-12-17 DIAGNOSIS — H6991 Unspecified Eustachian tube disorder, right ear: Secondary | ICD-10-CM | POA: Insufficient documentation

## 2016-12-17 LAB — CBC WITH DIFFERENTIAL/PLATELET
BASOS ABS: 0 10*3/uL (ref 0.0–0.1)
Basophils Relative: 1 %
Eosinophils Absolute: 0.2 10*3/uL (ref 0.0–0.7)
Eosinophils Relative: 5 %
HCT: 36.6 % (ref 36.0–46.0)
HEMOGLOBIN: 12.2 g/dL (ref 12.0–15.0)
LYMPHS PCT: 15 %
Lymphs Abs: 0.7 10*3/uL (ref 0.7–4.0)
MCH: 30.9 pg (ref 26.0–34.0)
MCHC: 33.3 g/dL (ref 30.0–36.0)
MCV: 92.7 fL (ref 78.0–100.0)
Monocytes Absolute: 0.3 10*3/uL (ref 0.1–1.0)
Monocytes Relative: 7 %
NEUTROS PCT: 72 %
Neutro Abs: 3.2 10*3/uL (ref 1.7–7.7)
Platelets: 94 10*3/uL — ABNORMAL LOW (ref 150–400)
RBC: 3.95 MIL/uL (ref 3.87–5.11)
RDW: 12.9 % (ref 11.5–15.5)
WBC: 4.5 10*3/uL (ref 4.0–10.5)

## 2016-12-17 LAB — MONONUCLEOSIS SCREEN: MONO SCREEN: NEGATIVE

## 2016-12-17 LAB — COMPREHENSIVE METABOLIC PANEL
ALT: 13 U/L — ABNORMAL LOW (ref 14–54)
AST: 24 U/L (ref 15–41)
Albumin: 3.5 g/dL (ref 3.5–5.0)
Alkaline Phosphatase: 51 U/L (ref 38–126)
Anion gap: 3 — ABNORMAL LOW (ref 5–15)
BUN: 16 mg/dL (ref 6–20)
CHLORIDE: 110 mmol/L (ref 101–111)
CO2: 27 mmol/L (ref 22–32)
Calcium: 8.6 mg/dL — ABNORMAL LOW (ref 8.9–10.3)
Creatinine, Ser: 0.79 mg/dL (ref 0.44–1.00)
Glucose, Bld: 93 mg/dL (ref 65–99)
POTASSIUM: 4.1 mmol/L (ref 3.5–5.1)
Sodium: 140 mmol/L (ref 135–145)
TOTAL PROTEIN: 6 g/dL — AB (ref 6.5–8.1)
Total Bilirubin: 0.7 mg/dL (ref 0.3–1.2)

## 2016-12-17 LAB — I-STAT TROPONIN, ED: Troponin i, poc: 0 ng/mL (ref 0.00–0.08)

## 2016-12-17 LAB — BRAIN NATRIURETIC PEPTIDE: B NATRIURETIC PEPTIDE 5: 51.7 pg/mL (ref 0.0–100.0)

## 2016-12-17 MED ORDER — IPRATROPIUM BROMIDE 0.06 % NA SOLN: 2.0000 | Freq: Four times a day (QID) | NASAL | 0 refills | Status: DC

## 2016-12-17 NOTE — Discharge Instructions (Signed)
You appear to have an upper respiratory infection (URI). An upper respiratory tract infection, or cold, is a viral infection of the air passages leading to the lungs. It is contagious and can be spread to others, especially during the first 3 or 4 days. It cannot be cured by antibiotics or other medicines. °RETURN IMMEDIATELY IF you develop shortness of breath, confusion or altered mental status, a new rash, become dizzy, faint, or poorly responsive, or are unable to be cared for at home. ° °

## 2016-12-17 NOTE — ED Triage Notes (Addendum)
Pt c/o productive cough. Family reports pt was seen on the 12th thinking she had the flu. Pt states not feeling any better and had diarrhea after being put on doxycyline.   Pt reports not being able to be placed on cardiac monitor due to breaking out with stickers.

## 2016-12-17 NOTE — ED Provider Notes (Signed)
[facility] DEPT Provider Note   CSN: [id number] Arrival date & time: 12/17/16  1121     History   Chief Complaint Chief Complaint  Patient presents with  . Cough    HPI Gloria [name] is a 83 y.o. female  Who has had 3 weeks persistent cough nasal congestion and ear pain on the R. She went to [place] walk in clinic and had her ears disimpacted. She was given 10 days of doxycycline. She was no better and came to the ED. She had no treatment at discharge. She has Been taking any over-the-counter medication treatments. When she saw Dr. Dorothyann Peng did on him. Advised to alternate between Motrin and Tylenol, but she states that that "did nothing." She continues to cough and feel miserable. She has not been running fevers. She denies any worsening shortness of breath.  HPI  Past Medical History:  Diagnosis Date  . Anxiety   . Blood in urine   . CAD (coronary artery disease)    non obstructive by cath  . Cancer [facility])    breast- left  . Depression   . GERD (gastroesophageal reflux disease)   . Hypertension   . Knee fracture   . TR (tricuspid regurgitation)    mild by Echo 12/2008 EF >55%    Patient Active Problem List   Diagnosis Date Noted  . Chest pain 03/15/2016  . Cancer of central portion of left female breast ([place]) 12/09/2015  . Bronchitis, chronic obstructive w acute bronchitis ([place]) 03/16/2014  . Bronchitis with bronchospasm 03/15/2014  . CAD (coronary artery disease), non obstructive  05/06/2013  . HYPERTENSION, BENIGN 12/23/2008  . HEMORRHOIDS 03/13/2008  . OTHER DYSPHAGIA 03/13/2008  . Asthma, intrinsic 10/30/2007  . G E R D 09/19/2007    Past Surgical History:  Procedure Laterality Date  . ABDOMINAL AORTAGRAM N/A 09/08/2011   Procedure: ABDOMINAL Maxcine Ham;  Surgeon: Troy Sine, MD;  Location: [facility] CATH LAB;  Service: Cardiovascular;  Laterality: N/A;  . ABDOMINAL HYSTERECTOMY    . BREAST SURGERY  08-30-10   mastectomy  . CARDIAC CATHETERIZATION  08/2011   20% LAD stenosis, 20% diagonal stenosis, 10-20% proximal dominant RCA stenosis  . COLON SURGERY    . LEFT HEART CATHETERIZATION WITH CORONARY ANGIOGRAM N/A 09/08/2011   Procedure: LEFT HEART CATHETERIZATION WITH CORONARY ANGIOGRAM;  Surgeon: Troy Sine, MD;  Location: [facility] CATH LAB;  Service: Cardiovascular;  Laterality: N/A;  . MINOR BREAST BIOPSY Left 11/25/2015   Procedure: MINOR EXCISION MASS LEFT CHEST WALL;  Surgeon: Autumn Messing III, MD;  Location: [place];  Service: General;  Laterality: Left;  . MINOR BREAST BIOPSY Left 07/17/2016   Procedure: EXCISION OF 2 LEFT CHEST WALL MASSES;  Surgeon: Autumn Messing III, MD;  Location: [place];  Service: General;  Laterality: Left;  . TONSILLECTOMY      OB History    No data available       Home Medications    Prior to Admission medications   Medication Sig Start Date End Date Taking? Authorizing Provider  acetaminophen (TYLENOL) 500 MG tablet Take 1,000 mg by mouth daily as needed for moderate pain. Reported on 03/10/2016    Historical Provider, MD  ALPRAZolam Duanne Moron) 0.25 MG tablet Take 1/2 tablet prior to radiation and 1 tablet prior to bedtime. 01/25/16   [name], MD  aspirin 81 MG chewable tablet Chew 81 mg by mouth once a week. Reported on 03/10/2016    Historical Provider, MD  CALCIUM-MAGNESIUM-VITAMIN D PO Take 1 tablet by mouth 2 (two) times daily.    Historical Provider, MD  cetirizine (ZYRTEC) 10 MG tablet Take 10 mg by mouth as needed for allergies.     Historical Provider, MD  doxepin (SINEQUAN) 10 MG capsule Take 10 mg by mouth at bedtime.     Historical Provider, MD  fluticasone (FLONASE) 50 MCG/ACT nasal spray Place 1 spray into both nostrils daily as needed for allergies.     Historical Provider, MD  guaiFENesin (MUCINEX) 600 MG 12 hr tablet Take 600 mg by mouth daily as needed for cough or to loosen phlegm. Reported on 03/28/2016    Historical Provider, MD  ipratropium (ATROVENT) 0.06 %  nasal spray Place 2 sprays into both nostrils 4 (four) times daily. 12/17/16   Margarita Mail, PA-C  losartan (COZAAR) 25 MG tablet Take 25 mg by mouth daily.  02/05/15   Historical Provider, MD  nitroGLYCERIN (NITROSTAT) 0.4 MG SL tablet Place 1 tablet (0.4 mg total) under the tongue every 5 (five) minutes as needed for chest pain. <PLEASE MAKE APPOINTMENT> 02/25/16   Troy Sine, MD  potassium chloride (K-DUR) 10 MEQ tablet Take 10 mEq by mouth daily.    Historical Provider, MD  pseudoephedrine-acetaminophen (TYLENOL SINUS) 30-500 MG TABS tablet Take 1 tablet by mouth every 4 (four) hours as needed.    Historical Provider, MD  tamoxifen (NOLVADEX) 20 MG tablet Take 1 tablet (20 mg total) by mouth daily. 09/19/16   Truitt Merle, MD  triamcinolone (KENALOG) 0.025 % cream Apply 1 application topically 2 (two) times daily. 11/20/16   Truitt Merle, MD    Family History Family History  Problem Relation Age of Onset  . Cancer Father     prostate  . Cancer Sister     breast  . Heart disease Sister   . Cancer Brother     pancreatic  . Cancer Maternal Grandmother     colon  . Dementia Mother   . Sudden death Brother   . Heart attack Brother   . Heart attack Brother   . Arthritis Sister     Social History Social History  Substance Use Topics  . Smoking status: Former Smoker    Quit date: 11/15/1991  . Smokeless tobacco: Never Used  . Alcohol use 1.2 oz/week    2 Glasses of wine per week     Comment: 1 glass per week     Allergies   Contrast media [iodinated diagnostic agents]; Penicillins; Adhesive [tape]; Cephalexin; Ciprofloxacin; Demerol; Sulfamethoxazole-trimethoprim; and Quinolones   Review of Systems Review of Systems  Ten systems reviewed and are negative for acute change, except as noted in the HPI.   Physical Exam Updated Vital Signs BP 137/68   Pulse 74   Temp 98.2 F (36.8 C) (Oral)   Resp 16   Ht 5\' 1"  (1.549 m)   Wt 45.8 kg   SpO2 95%   BMI 19.08 kg/m   Physical  Exam  Constitutional: She is oriented to person, place, and time. She appears well-developed and well-nourished. No distress.  HENT:  Head: Normocephalic and atraumatic.  Facial congestion. Boggy nasal turbinates Left Tm with fluid levels. Oropharynx clear.  Eyes: Conjunctivae are normal. No scleral icterus.  Neck: Normal range of motion.  Cardiovascular: Normal rate, regular rhythm and normal heart sounds.  Exam reveals no gallop and no friction rub.   No murmur heard. Pulmonary/Chest: Effort normal and breath sounds normal. No respiratory distress.  Ronchi in  the Left mid- to lower lung field.  Abdominal: Soft. Bowel sounds are normal. She exhibits no distension and no mass. There is no tenderness. There is no guarding.  Neurological: She is alert and oriented to person, place, and time.  Skin: Skin is warm and dry. She is not diaphoretic.  Nursing note and vitals reviewed.    ED Treatments / Results  Labs (all labs ordered are listed, but only abnormal results are displayed) Labs Reviewed  COMPREHENSIVE METABOLIC PANEL - Abnormal; Notable for the following:       Result Value   Calcium 8.6 (*)    Total Protein 6.0 (*)    ALT 13 (*)    Anion gap 3 (*)    All other components within normal limits  CBC WITH DIFFERENTIAL/PLATELET - Abnormal; Notable for the following:    Platelets 94 (*)    All other components within normal limits  RESPIRATORY PANEL BY PCR  BRAIN NATRIURETIC PEPTIDE  MONONUCLEOSIS SCREEN  I-STAT TROPOININ, ED    EKG  EKG Interpretation  Date/Time:  Sunday December 17 2016 13:15:22 EST Ventricular Rate:  68 PR Interval:    QRS Duration: 92 QT Interval:  433 QTC Calculation: 461 R Axis:   45 Text Interpretation:  Sinus rhythm No significant change since last tracing Confirmed by FLOYD MD, DANIEL [phone]) on 12/17/2016 3:09:42 PM       Radiology Dg Chest 2 View  Result Date: 12/17/2016 CLINICAL DATA:  Cough for 1 week.  History of breast  carcinoma EXAM: CHEST  2 VIEW COMPARISON:  Chest radiograph December 11, 2016 and PET-CT July 14, 2016 FINDINGS: Opacity remains in the lingula, likely residua of previous radiation therapy in this area. There is no new opacity. Heart size and pulmonary vascularity are normal. No adenopathy. There is atherosclerotic calcification in the aorta. Patient is status post left mastectomy with surgical clips in left axillary region. There is degenerative change in the thoracic spine. No blastic or lytic bone lesions evident. IMPRESSION: Persistent opacity in the lingula, likely due to radiation fibrosis. Lungs elsewhere clear. Stable cardiac silhouette. There is aortic atherosclerosis. No adenopathy. Patient is status post left mastectomy with surgical clips in the left axillary region. Electronically Signed   By: Lowella Grip III M.D.   On: 12/17/2016 12:39    Procedures Procedures (including critical care time)  Medications Ordered in ED Medications - No data to display   Initial Impression / Assessment and Plan / ED Course  I have reviewed the triage vital signs and the nursing notes.  Pertinent labs & imaging results that were available during my care of the patient were reviewed by me and considered in my medical decision making (see chart for details).    Patient x-ray shows an opacity in the left lingula which has been present over several years, is likely radiation fibrosis from previous breast radiation. This unchanged. She is without wheezing. She appears to have eustachian tube dysfunction secondary to nasal congestion. Patient has completed a course of doxycycline, which should cover for most atypical bacteria is and also covers secondarily for pertussis. Patient has a pending. Respiratory viral panel. She is negative for mono. She has no chest pain. I have advised the patient to take over-the-counter cough suppressants. She'll be given Atrovent nasal spray for decongestion of her  oropharynx and hopefully will help clear up her eustachian tube dysfunction. I discussed reasons to seek immediate medical care. She is to follow-up with her PCP.  Final Clinical  Impressions(s) / ED Diagnoses   Final diagnoses:  Upper respiratory tract infection, unspecified type  Eustachian tube dysfunction, right  Nasal congestion  Cough    New Prescriptions New Prescriptions   IPRATROPIUM (ATROVENT) 0.06 % NASAL SPRAY    Place 2 sprays into both nostrils 4 (four) times daily.     Margarita Mail, PA-C 12/17/16 [place], DO 12/17/16 [phone]

## 2016-12-17 NOTE — ED Notes (Signed)
CALLED LAB FOR SWAB CULTURES TO OBTAIN LAB REQUESTED

## 2016-12-18 LAB — RESPIRATORY PANEL BY PCR
Adenovirus: NOT DETECTED
BORDETELLA PERTUSSIS-RVPCR: NOT DETECTED
CORONAVIRUS 229E-RVPPCR: NOT DETECTED
CORONAVIRUS OC43-RVPPCR: NOT DETECTED
Chlamydophila pneumoniae: NOT DETECTED
Coronavirus HKU1: NOT DETECTED
Coronavirus NL63: NOT DETECTED
INFLUENZA A-RVPPCR: NOT DETECTED
INFLUENZA B-RVPPCR: NOT DETECTED
Metapneumovirus: NOT DETECTED
Mycoplasma pneumoniae: NOT DETECTED
PARAINFLUENZA VIRUS 2-RVPPCR: NOT DETECTED
PARAINFLUENZA VIRUS 3-RVPPCR: NOT DETECTED
PARAINFLUENZA VIRUS 4-RVPPCR: NOT DETECTED
Parainfluenza Virus 1: NOT DETECTED
RESPIRATORY SYNCYTIAL VIRUS-RVPPCR: NOT DETECTED
RHINOVIRUS / ENTEROVIRUS - RVPPCR: NOT DETECTED

## 2016-12-21 DIAGNOSIS — J329 Chronic sinusitis, unspecified: Secondary | ICD-10-CM | POA: Diagnosis not present

## 2016-12-21 DIAGNOSIS — R51 Headache: Secondary | ICD-10-CM | POA: Diagnosis not present

## 2016-12-25 ENCOUNTER — Ambulatory Visit: Payer: PPO

## 2017-01-22 NOTE — Progress Notes (Signed)
[place]  [phone]) [phone] Fax:(336) [phone]  OFFICE PROGRESS NOTE  PATIENT: Gloria [name]   DOB: 03-Dec-1927  MR#: [id number]  ERX#:[id number]  PJ:[id number],PYK Gloria Moores, MD Gloria Edison, MD Gloria [name]. Gloria Nakayama, MD  DIAGNOSIS:  An 83 y.o. [place] woman with invasive lobular carcinoma of the left breast diagnosed in 07/2010, chest wall local recurrence in 09/2015 .  Oncology History   Cancer of central portion of left female breast [facility])   Staging form: Breast, AJCC 7th Edition     Pathologic stage from 08/04/2010: Stage IIB (T2, N1a, cM0) - Signed by Gloria Merle, MD on 12/09/2015       Cancer of central portion of left female breast ([place])   08/03/2010 Mammogram     left nipple retraction, and the palpable mass subareolar left breast at the 6:00 position. Ultrasound confirmed the presence of a mass measuring 2.6 x 1.7 x 2.4 cm      08/04/2010 Initial Biopsy    left breast mass biopsy showed an invasive mammary carcinoma with lobular features. ER 95%, PR 97%, Ki-67 17%, HER-2/neu (-)      08/04/2010 Pathologic Stage    Stage IIB: T2 N1a      08/11/2010 Imaging    left breast mass 4.6 x 4.2 x 2.3 cm with enhancement distortion extending to the nipple retraction. There is questionable anterior mediastinal lymph node seen      07/2010 - 09/03/2010 Anti-estrogen oral therapy    neoadjuvant letrozole       09/03/2010 Surgery     left breast mastectomy with sentinel node biopsy on 09/03/2010.      08/2010 - 01/2015 Anti-estrogen oral therapy    Tamoxifen, pt stopped on her own       12/03/2010 - 01/23/2011 Radiation Therapy    left chest wall and axilla radiaiton       10/19/2015 Progression    Left chest wall recurrence, s/p resection on 11/25/2015 with positive margins ([facility])       12/10/2015 -  Anti-estrogen oral therapy    anastrozole 37m daily, which was switched to Tamoxifen on 06/30/2016 due to persistent disease.      01/03/2016 - 03/03/2016 Radiation  Therapy    Radiation to left chest wall recurrence (12/06/2015-02/04/2016 - 44 Gy), pt progressed through the radiation, and had additional 10 fractions of radiation (02/21/16-03/03/2016 - total 70 Gy to the left chest wall)      07/14/2016 PET scan    PET 07/14/2016 IMPRESSION: 1. No findings of recurrent malignancy. 2. Radiation fibrosis in the lingula. A small left axillary lymph node is not hypermetabolic and only 6 mm in diameter. This is in the vicinity of the prior axillary cyst. 3. Pelvic floor laxity.      07/17/2016 Surgery    Chest wall soft tissue resection for local recurrence      07/17/2016 Pathology Results    Left chest wall two soft tissue resection both showed invasive lobular carcinoma, margins were positive. ER 95% positive, PR 60% positive, HER-2 negative.      07/27/2016 Imaging    CT chest, abdomen and pelvis wo contrast  07/27/2016 IMPRESSION: Sigmoid diverticulosis without acute diverticulitis. No bowel obstruction or acute inflammation. Degenerative disc disease and facet arthropathy of the lower lumbar spine with slight grade 1 anterolisthesis of L4 on L5. Stable appearing simple left-sided renal cysts.        CURRENT THERAPY:  Tamoxifen 233mdaily    INTERVAL HISTORY:  [name]  returns for follow-up. She Came into my office today by herself. She is doing well, denies any new pain, still has mild discomfort at left chest wall. She has flu last month, resolved. She has had some left ear pain which radiated to left jaw, she was concerned about this could be heart problem, but no chest tightness or dyspnea. She also reports.urine lately, no dysuria or urinary frequency. No fever. She was seen by her urologist and was treated for UTI recently. But it she reports her urine color has not changed back to normal. She otherwise has good appetite, decent energy level, lives independently, and functions well at home. She is tolerating tamoxifen well, with mild hot flash. No  significant joint pain, except her right knee pain which is chronic.   PAST MEDICAL HISTORY: Past Medical History:  Diagnosis Date  . Anxiety   . Blood in urine   . CAD (coronary artery disease)    non obstructive by cath  . Cancer [facility])    breast- left  . Depression   . GERD (gastroesophageal reflux disease)   . Hypertension   . Knee fracture   . TR (tricuspid regurgitation)    mild by Echo 12/2008 EF >55%    PAST SURGICAL HISTORY: Past Surgical History:  Procedure Laterality Date  . ABDOMINAL AORTAGRAM N/A 09/08/2011   Procedure: ABDOMINAL Gloria [name];  Surgeon: Gloria Sine, MD;  Location: [facility] CATH LAB;  Service: Cardiovascular;  Laterality: N/A;  . ABDOMINAL HYSTERECTOMY    . BREAST SURGERY  08-30-10   mastectomy  . CARDIAC CATHETERIZATION  08/2011   20% LAD stenosis, 20% diagonal stenosis, 10-20% proximal dominant RCA stenosis  . COLON SURGERY    . LEFT HEART CATHETERIZATION WITH CORONARY ANGIOGRAM N/A 09/08/2011   Procedure: LEFT HEART CATHETERIZATION WITH CORONARY ANGIOGRAM;  Surgeon: Gloria Sine, MD;  Location: [facility] CATH LAB;  Service: Cardiovascular;  Laterality: N/A;  . MINOR BREAST BIOPSY Left 11/25/2015   Procedure: MINOR EXCISION MASS LEFT CHEST WALL;  Surgeon: Gloria Messing III, MD;  Location: [place];  Service: General;  Laterality: Left;  . MINOR BREAST BIOPSY Left 07/17/2016   Procedure: EXCISION OF 2 LEFT CHEST WALL MASSES;  Surgeon: Gloria Messing III, MD;  Location: [place];  Service: General;  Laterality: Left;  . TONSILLECTOMY      FAMILY HISTORY: Family History  Problem Relation Age of Onset  . Cancer Father     prostate  . Cancer Sister     breast  . Heart disease Sister   . Cancer Brother     pancreatic  . Cancer Maternal Grandmother     colon  . Dementia Mother   . Sudden death Brother   . Heart attack Brother   . Heart attack Brother   . Arthritis Sister     SOCIAL HISTORY: Social History  Substance Use Topics   . Smoking status: Former Smoker    Quit date: 11/15/1991  . Smokeless tobacco: Never Used  . Alcohol use 1.2 oz/week    2 Glasses of wine per week     Comment: 1 glass per week    ALLERGIES: Allergies  Allergen Reactions  . Contrast Media [Iodinated Diagnostic Agents] Anaphylaxis  . Penicillins Anaphylaxis and Swelling    Has patient had a PCN reaction causing immediate rash, facial/tongue/throat swelling, SOB or lightheadedness with hypotension: Yes Has patient had a PCN reaction causing severe rash involving mucus membranes or skin necrosis: No Has patient had  a PCN reaction that required hospitalization Yes Has patient had a PCN reaction occurring within the last 10 years: No If all of the above answers are "NO", then may proceed with Cephalosporin use.   . Adhesive [Tape] Other (See Comments)    blisters  . Cephalexin Swelling  . Ciprofloxacin Swelling  . Demerol Nausea And Vomiting  . Sulfamethoxazole-Trimethoprim Itching and Swelling    REACTION: swelling/hives  . Quinolones Itching and Rash    REACTION: itching, rash Pt. Reports no problems with levaquin     MEDICATIONS:  Current Outpatient Prescriptions  Medication Sig Dispense Refill  . acetaminophen (TYLENOL) 500 MG tablet Take 1,000 mg by mouth daily as needed for moderate pain. Reported on 03/10/2016    . ALPRAZolam (XANAX) 0.25 MG tablet Take 1/2 tablet prior to radiation and 1 tablet prior to bedtime. 30 tablet 1  . aspirin 81 MG chewable tablet Chew 81 mg by mouth once a week. Reported on 03/10/2016    . CALCIUM-MAGNESIUM-VITAMIN D PO Take 1 tablet by mouth 2 (two) times daily.    . cetirizine (ZYRTEC) 10 MG tablet Take 10 mg by mouth as needed for allergies.     Marland Kitchen doxepin (SINEQUAN) 10 MG capsule Take 10 mg by mouth at bedtime.     . fluticasone (FLONASE) 50 MCG/ACT nasal spray Place 1 spray into both nostrils daily as needed for allergies.     Marland Kitchen guaiFENesin (MUCINEX) 600 MG 12 hr tablet Take 600 mg by mouth  daily as needed for cough or to loosen phlegm. Reported on 03/28/2016    . ipratropium (ATROVENT) 0.06 % nasal spray Place 2 sprays into both nostrils 4 (four) times daily. 15 mL 0  . losartan (COZAAR) 25 MG tablet Take 25 mg by mouth daily.   0  . pseudoephedrine-acetaminophen (TYLENOL SINUS) 30-500 MG TABS tablet Take 1 tablet by mouth every 4 (four) hours as needed.    . tamoxifen (NOLVADEX) 20 MG tablet Take 1 tablet (20 mg total) by mouth daily. 90 tablet 1  . triamcinolone (KENALOG) 0.025 % cream Apply 1 application topically 2 (two) times daily. 30 g 0  . nitroGLYCERIN (NITROSTAT) 0.4 MG SL tablet Place 1 tablet (0.4 mg total) under the tongue every 5 (five) minutes as needed for chest pain. <PLEASE MAKE APPOINTMENT> (Patient not taking: Reported on 01/23/2017) 25 tablet 3  . potassium chloride (K-DUR) 10 MEQ tablet Take 10 mEq by mouth daily.     No current facility-administered medications for this visit.       REVIEW OF SYSTEMS: A 10 point review of systems was completed and is negative except as noted above.   Health Maintenance  Mammogram: 10/19/2015   PHYSICAL EXAMINATION: BP 118/86 (BP Location: Left Arm, Patient Position: Sitting)   Pulse 81   Temp 98.3 F (36.8 C) (Oral)   Resp 18   Ht 5' 1"  (1.549 m)   Wt 125 lb 4.8 oz (56.8 kg)   SpO2 98%   BMI 23.68 kg/m   GENERAL: Patient is a well appearing female in no acute distress HEENT:  Sclerae anicteric.  Oropharynx clear and moist. No ulcerations or evidence of oropharyngeal candidiasis. Neck is supple.  NODES:   There is no cervical , supraclavicular, or axillary lymphadenopathy palpated.  LUNGS:  Clear to auscultation bilaterally.  No wheezes or rhonchi. HEART:  Regular rate and rhythm. No murmur appreciated. ABDOMEN:  Soft, nontender.  Positive, normoactive bowel sounds. No organomegaly palpated. MSK:  No focal spinal tenderness  to palpation. Full range of motion bilaterally in the upper extremities. EXTREMITIES:   No peripheral edema.   SKIN:  Clear with no obvious rashes or skin changes. No nail dyscrasia. NEURO:  Nonfocal. Well oriented.  Appropriate affect. BREAST EXAM:  Left breast is surgically absent,  incision in the left frontal chest has healed well. Small nodularity along the incision line, unchanged, nontender. Right breast and bilateral axilla notable for no masses or nodules.   ECOG FS:  Grade 1 - Symptomatic but completely ambulatory   LAB RESULTS: CBC Latest Ref Rng & Units 01/23/2017 12/17/2016 11/20/2016  WBC 3.9 - 10.3 10e3/uL 2.2(L) 4.5 5.0  Hemoglobin 11.6 - 15.9 g/dL 12.5 12.2 13.1  Hematocrit 34.8 - 46.6 % 37.5 36.6 39.3  Platelets 145 - 400 10e3/uL 160 94(L) 125(L)      CMP Latest Ref Rng & Units 01/23/2017 12/17/2016 11/20/2016  Glucose 70 - 140 mg/dl 104 93 99  BUN 7.0 - 26.0 mg/dL 15.8 16 18.3  Creatinine 0.6 - 1.1 mg/dL 0.9 0.79 1.0  Sodium 136 - 145 mEq/L 143 140 140  Potassium 3.5 - 5.1 mEq/L 4.0 4.1 4.1  Chloride 101 - 111 mmol/L - 110 -  CO2 22 - 29 mEq/L 26 27 26   Calcium 8.4 - 10.4 mg/dL 9.0 8.6(L) 9.2  Total Protein 6.4 - 8.3 g/dL 5.8(L) 6.0(L) 6.3(L)  Total Bilirubin 0.20 - 1.20 mg/dL 0.39 0.7 0.58  Alkaline Phos 40 - 150 U/L 49 51 51  AST 5 - 34 U/L 23 24 19   ALT 0 - 55 U/L 14 13(L) 9   PATHOLOGY REPORT Diagnosis 11/25/2015 Soft tissue mass, simple excision, Left chest wall - INVASIVE LOBULAR CARCINOMA, PRESENT AT NON ORIENTED TISSUE EDGE(S). - SEE COMMENT. Microscopic Comment The carcinoma appears grade 2. A breast prognostic profile will be performed and the results reported separately. (JDP:gt, 11/26/15).  Results: IMMUNOHISTOCHEMICAL AND MORPHOMETRIC ANALYSIS PERFORMED MANUALLY Estrogen Receptor: 95%, POSITIVE, STRONG STAINING INTENSITY Progesterone Receptor: 60%, POSITIVE, STRONG STAINING INTENSITY Results: HER2 - NEGATIVE RATIO OF HER2/CEP17 SIGNALS 1.07 AVERAGE HER2 COPY NUMBER PER CELL 2.90  Diagnosis 07/17/2016 1. Soft tissue mass, simple  excision, Chest wall medial left - INVASIVE LOBULAR CARCINOMA, SEE COMMENT. - NODULE OF FIBROSIS AND CALCIFICATION. 2. Soft tissue mass, simple excision, Chest wall lateral left - INVASIVE LOBULAR CARCINOMA, SEE COMMENT. Microscopic Comment 1. -2. Both parts reveal foci of invasive lobular carcinoma. There is only limited tumor in part #1. Tumor does extend to the uninked edges of both parts. The carcinoma appears grade 2. A prognostic panel has been ordered on part #2.  2. PROGNOSTIC INDICATORS Results: IMMUNOHISTOCHEMICAL AND MORPHOMETRIC ANALYSIS PERFORMED MANUALLY Estrogen Receptor: 60%, POSITIVE, STRONG STAINING INTENSITY Progesterone Receptor: 90%, POSITIVE, STRONG STAINING INTENSITY Proliferation Marker Ki67: 10%  Results: HER2 - NEGATIVE RATIO OF HER2/CEP17 SIGNALS 1.20 AVERAGE HER2 COPY NUMBER PER CELL 2.40   RADIOGRAPHIC STUDIES: CT chest, abdomen and pelvis wo contrast  07/27/2016 IMPRESSION: Sigmoid diverticulosis without acute diverticulitis. No bowel obstruction or acute inflammation. Degenerative disc disease and facet arthropathy of the lower lumbar spine with slight grade 1 anterolisthesis of L4 on L5. Stable appearing simple left-sided renal cysts.  PET 07/14/2016 IMPRESSION: 1. No findings of recurrent malignancy. 2. Radiation fibrosis in the lingula. A small left axillary lymph node is not hypermetabolic and only 6 mm in diameter. This is in the vicinity of the prior axillary cyst. 3. Pelvic floor laxity.   ASSESSMENT/PLAN: 83 y.o. with:  1. Stage IIB, T2 N1 invasive lobular  carcinoma, grade 2, ER 95%, PR 97%, Ki-67 17%, HER-2/neu no amplification, diagnosed in 07/2010, local chest wall recurrence in 09/2015   -She was on adjuvant tamoxifen for 4.5 years, stopped on her own. -She unfortunately developed local recurrence 8 months after she stopped tamoxifen. -I previously reviewed her surgical pathology findings, which is consistent with lobular carcinoma  recurrence from her previous breast cancer. -The tumor was surgically removed, with positive margins. -Her restaging CT scan and bone scan in 12/2015 was negative for distant metastasis. -She has completed chest wall radiation, however developed 2-3 small nodules during the radiation and anastrozole therapy, supicious for residual cancer. US showed a 4 mm and 6 mm subcutaneous nodule, partially calcified, highly suspicious for local recurrence. -She subsequently underwent a second chest wall surgery for two soft tissue resection, I previously reviewed her pathology results, both showed invasive lobular carcinoma, margins were positive. I spoke with Dr. Marlou Starks, no more surgery will be offered, because complete surgical resection is unlikely. Patient is also not in favor of more extensive surgery. -We previously discussed her that her cancer is unlikely curable at this stage, and our goal of therapy is palliative for disease control, and prolong her life. She voiced good understanding. -I have changed her endocrine therapy to Tamoxifen, she is tolerating very well, we'll continue indefinitely. -Her last PET scan and CT abdomen and pelvis in September 2017 were negative for metastatic disease -If she is clinically doing well, I'll plan to repeat scan in Sep 2018  -She is clinically doing well, exam was unremarkable, lab reviewed with her, no clinical concern for chest wall disease progression or metastatic disease for now. - her last right breast mammogram was in December 2016. She is overdue, I ordered last time but she cancelled due to flu, will schedule her right breast mammogram asap    2. HTN, CAD  -She'll continue follow-up with her primary care physician and cardiologist  3. Left shoulder blade pain -Chronic, intermittent, mild, for the past year. Prior bone scan was negative -Possible muscular related, we'll continue monitoring -near resolved lately   4. Osteoporosis -her last DEXA scan in  06/2012 showed osteoporosis  -I encourage her to continue calcium and Vit D -I encourage her to repeat DEXA at her PCP office   5. Dark urine  -she was recently treated for UTI by her urologist -Patient complains physician Dr. urine, I'll check a UA and urine culture today  Plan -Continue tamoxifen indefinitely -right breast mammogram this month  -Labs and follow up in 2 months. -urine test today   I spent 20 minutes counseling the patient face to face.  The total time spent in the appointment was 25 minutes.  Gloria [name]  01/23/2017   This document serves as a record of services personally performed by Gloria Merle, MD. It was created on her behalf by Darcus Austin, a trained medical scribe. The creation of this record is based on the scribe's personal observations and the provider's statements to them. This document has been checked and approved by the attending provider.

## 2017-01-23 ENCOUNTER — Ambulatory Visit (HOSPITAL_BASED_OUTPATIENT_CLINIC_OR_DEPARTMENT_OTHER): Payer: PPO

## 2017-01-23 ENCOUNTER — Telehealth: Payer: Self-pay | Admitting: Hematology

## 2017-01-23 ENCOUNTER — Ambulatory Visit (HOSPITAL_BASED_OUTPATIENT_CLINIC_OR_DEPARTMENT_OTHER): Payer: PPO | Admitting: Hematology

## 2017-01-23 ENCOUNTER — Other Ambulatory Visit (HOSPITAL_BASED_OUTPATIENT_CLINIC_OR_DEPARTMENT_OTHER): Payer: PPO

## 2017-01-23 VITALS — BP 118/86 | HR 81 | Temp 98.3°F | Resp 18 | Ht 61.0 in | Wt 125.3 lb

## 2017-01-23 DIAGNOSIS — M25561 Pain in right knee: Secondary | ICD-10-CM

## 2017-01-23 DIAGNOSIS — R8290 Unspecified abnormal findings in urine: Secondary | ICD-10-CM | POA: Diagnosis not present

## 2017-01-23 DIAGNOSIS — C50112 Malignant neoplasm of central portion of left female breast: Secondary | ICD-10-CM

## 2017-01-23 DIAGNOSIS — C44501 Unspecified malignant neoplasm of skin of breast: Secondary | ICD-10-CM

## 2017-01-23 DIAGNOSIS — Z170 Estrogen receptor positive status [ER+]: Principal | ICD-10-CM

## 2017-01-23 DIAGNOSIS — G8929 Other chronic pain: Secondary | ICD-10-CM | POA: Diagnosis not present

## 2017-01-23 DIAGNOSIS — I10 Essential (primary) hypertension: Secondary | ICD-10-CM | POA: Diagnosis not present

## 2017-01-23 DIAGNOSIS — N390 Urinary tract infection, site not specified: Secondary | ICD-10-CM | POA: Diagnosis not present

## 2017-01-23 DIAGNOSIS — M810 Age-related osteoporosis without current pathological fracture: Secondary | ICD-10-CM | POA: Diagnosis not present

## 2017-01-23 DIAGNOSIS — I2510 Atherosclerotic heart disease of native coronary artery without angina pectoris: Secondary | ICD-10-CM

## 2017-01-23 LAB — URINALYSIS, MICROSCOPIC - CHCC
Bilirubin (Urine): NEGATIVE
Glucose: NEGATIVE mg/dL
Ketones: NEGATIVE mg/dL
Nitrite: NEGATIVE
PROTEIN: NEGATIVE mg/dL
SPECIFIC GRAVITY, URINE: 1.02 (ref 1.003–1.035)
Urobilinogen, UR: 0.2 mg/dL (ref 0.2–1)
pH: 6 (ref 4.6–8.0)

## 2017-01-23 LAB — CBC WITH DIFFERENTIAL/PLATELET
BASO%: 2 % (ref 0.0–2.0)
BASOS ABS: 0 10*3/uL (ref 0.0–0.1)
EOS%: 7 % (ref 0.0–7.0)
Eosinophils Absolute: 0.2 10*3/uL (ref 0.0–0.5)
HCT: 37.5 % (ref 34.8–46.6)
HEMOGLOBIN: 12.5 g/dL (ref 11.6–15.9)
LYMPH#: 0.5 10*3/uL — AB (ref 0.9–3.3)
LYMPH%: 21 % (ref 14.0–49.7)
MCH: 31.1 pg (ref 25.1–34.0)
MCHC: 33.4 g/dL (ref 31.5–36.0)
MCV: 93.3 fL (ref 79.5–101.0)
MONO#: 0.3 10*3/uL (ref 0.1–0.9)
MONO%: 12 % (ref 0.0–14.0)
NEUT#: 1.3 10*3/uL — ABNORMAL LOW (ref 1.5–6.5)
NEUT%: 58 % (ref 38.4–76.8)
Platelets: 160 10*3/uL (ref 145–400)
RBC: 4.02 10*6/uL (ref 3.70–5.45)
RDW: 13.4 % (ref 11.2–14.5)
WBC: 2.2 10*3/uL — ABNORMAL LOW (ref 3.9–10.3)

## 2017-01-23 LAB — COMPREHENSIVE METABOLIC PANEL
ALT: 14 U/L (ref 0–55)
AST: 23 U/L (ref 5–34)
Albumin: 3.4 g/dL — ABNORMAL LOW (ref 3.5–5.0)
Alkaline Phosphatase: 49 U/L (ref 40–150)
Anion Gap: 8 mEq/L (ref 3–11)
BUN: 15.8 mg/dL (ref 7.0–26.0)
CHLORIDE: 109 meq/L (ref 98–109)
CO2: 26 meq/L (ref 22–29)
CREATININE: 0.9 mg/dL (ref 0.6–1.1)
Calcium: 9 mg/dL (ref 8.4–10.4)
EGFR: 60 mL/min/{1.73_m2} — ABNORMAL LOW (ref >90)
GLUCOSE: 104 mg/dL (ref 70–140)
POTASSIUM: 4 meq/L (ref 3.5–5.1)
SODIUM: 143 meq/L (ref 136–145)
Total Bilirubin: 0.39 mg/dL (ref 0.20–1.20)
Total Protein: 5.8 g/dL — ABNORMAL LOW (ref 6.4–8.3)

## 2017-01-23 NOTE — Telephone Encounter (Signed)
Gave patient AVS and calender per 01/23/2017 los.  

## 2017-01-24 LAB — URINE CULTURE: Organism ID, Bacteria: NO GROWTH

## 2017-01-24 LAB — CANCER ANTIGEN 27.29: CAN 27.29: 23.1 U/mL (ref 0.0–38.6)

## 2017-01-26 ENCOUNTER — Telehealth: Payer: Self-pay | Admitting: *Deleted

## 2017-01-26 NOTE — Telephone Encounter (Signed)
Spoke with pt and informed her of urine studies are all negative as per [name].  Pt voiced understanding.

## 2017-01-26 NOTE — Telephone Encounter (Signed)
-----   Message from Truitt Merle, MD sent at 01/26/2017 11:01 AM EDT ----- Please let ot know her UA and urine cultures are all negative, I do not recommend antibiotics.   Truitt Merle  01/26/2017

## 2017-02-23 DIAGNOSIS — Z23 Encounter for immunization: Secondary | ICD-10-CM | POA: Diagnosis not present

## 2017-02-23 DIAGNOSIS — J449 Chronic obstructive pulmonary disease, unspecified: Secondary | ICD-10-CM | POA: Diagnosis not present

## 2017-02-23 DIAGNOSIS — C50312 Malignant neoplasm of lower-inner quadrant of left female breast: Secondary | ICD-10-CM | POA: Diagnosis not present

## 2017-02-23 DIAGNOSIS — J42 Unspecified chronic bronchitis: Secondary | ICD-10-CM | POA: Diagnosis not present

## 2017-02-23 DIAGNOSIS — Z0000 Encounter for general adult medical examination without abnormal findings: Secondary | ICD-10-CM | POA: Diagnosis not present

## 2017-02-23 DIAGNOSIS — I10 Essential (primary) hypertension: Secondary | ICD-10-CM | POA: Diagnosis not present

## 2017-02-23 DIAGNOSIS — M810 Age-related osteoporosis without current pathological fracture: Secondary | ICD-10-CM | POA: Diagnosis not present

## 2017-02-23 DIAGNOSIS — Z1389 Encounter for screening for other disorder: Secondary | ICD-10-CM | POA: Diagnosis not present

## 2017-03-01 ENCOUNTER — Ambulatory Visit
Admission: RE | Admit: 2017-03-01 | Discharge: 2017-03-01 | Disposition: A | Discharge status: Home / Self Care | Payer: PPO | Source: Ambulatory Visit | Attending: Hematology | Admitting: Hematology

## 2017-03-01 DIAGNOSIS — Z9012 Acquired absence of left breast and nipple: Secondary | ICD-10-CM

## 2017-03-01 DIAGNOSIS — Z1231 Encounter for screening mammogram for malignant neoplasm of breast: Secondary | ICD-10-CM | POA: Diagnosis not present

## 2017-03-01 HISTORY — DX: Personal history of irradiation: Z92.3

## 2017-03-12 DIAGNOSIS — C50912 Malignant neoplasm of unspecified site of left female breast: Secondary | ICD-10-CM | POA: Diagnosis not present

## 2017-03-19 NOTE — Progress Notes (Signed)
[place]  Telephone:([phone]) [phone] Fax:(336) [phone]  OFFICE PROGRESS NOTE  PATIENT: Gloria [name]   DOB: December 11, 1927  MR#: [id number]  UMP#:[id number]  QM:[id number], [name], MD [name], MD [name]. [name], MD  DIAGNOSIS:  An 83 y.o. [place] woman with invasive lobular carcinoma of the left breast diagnosed in 07/2010, chest wall local recurrence in 09/2015 .  Oncology History   Cancer of central portion of left female breast [facility])   Staging form: Breast, AJCC 7th Edition     Pathologic stage from 08/04/2010: Stage IIB (T2, N1a, cM0) - Signed by Truitt Merle, MD on 12/09/2015       Cancer of central portion of left female breast ([place])   08/03/2010 Mammogram     left nipple retraction, and the palpable mass subareolar left breast at the 6:00 position. Ultrasound confirmed the presence of a mass measuring 2.6 x 1.7 x 2.4 cm      08/04/2010 Initial Biopsy    left breast mass biopsy showed an invasive mammary carcinoma with lobular features. ER 95%, PR 97%, Ki-67 17%, HER-2/neu (-)      08/04/2010 Pathologic Stage    Stage IIB: T2 N1a      08/11/2010 Imaging    left breast mass 4.6 x 4.2 x 2.3 cm with enhancement distortion extending to the nipple retraction. There is questionable anterior mediastinal lymph node seen      07/2010 - 09/03/2010 Anti-estrogen oral therapy    neoadjuvant letrozole       09/03/2010 Surgery     left breast mastectomy with sentinel node biopsy on 09/03/2010.      08/2010 - 01/2015 Anti-estrogen oral therapy    Tamoxifen, pt stopped on her own       12/03/2010 - 01/23/2011 Radiation Therapy    left chest wall and axilla radiaiton       10/19/2015 Progression    Left chest wall recurrence, s/p resection on 11/25/2015 with positive margins ([facility])       12/10/2015 -  Anti-estrogen oral therapy    anastrozole 51m daily, which was switched to Tamoxifen on 06/30/2016 due to persistent disease.      01/03/2016 - 03/03/2016 Radiation Therapy     Radiation to left chest wall recurrence (12/06/2015-02/04/2016 - 44 Gy), pt progressed through the radiation, and had additional 10 fractions of radiation (02/21/16-03/03/2016 - total 70 Gy to the left chest wall)      07/14/2016 PET scan    PET 07/14/2016 IMPRESSION: 1. No findings of recurrent malignancy. 2. Radiation fibrosis in the lingula. A small left axillary lymph node is not hypermetabolic and only 6 mm in diameter. This is in the vicinity of the prior axillary cyst. 3. Pelvic floor laxity.      07/17/2016 Surgery    Chest wall soft tissue resection for local recurrence      07/17/2016 Pathology Results    Left chest wall two soft tissue resection both showed invasive lobular carcinoma, margins were positive. ER 95% positive, PR 60% positive, HER-2 negative.      07/27/2016 Imaging    CT chest, abdomen and pelvis wo contrast  07/27/2016 IMPRESSION: Sigmoid diverticulosis without acute diverticulitis. No bowel obstruction or acute inflammation. Degenerative disc disease and facet arthropathy of the lower lumbar spine with slight grade 1 anterolisthesis of L4 on L5. Stable appearing simple left-sided renal cysts.      03/01/2017 Mammogram    Mammogram 03/01/2017 IMPRESSION: No mammographic evidence of malignancy. A result  letter of this screening mammogram will be mailed directly to the patient. RECOMMENDATION: Screening mammogram in one year        CURRENT THERAPY:  Tamoxifen 55m daily    INTERVAL HISTORY:  SMiarareturns for follow-up. She presents to the clinic today reporting slight nausea and a spot on her upper chest. She went to Dr.Toth to use medication for them or go to a dermatologist. She thinks they are growths. She has slight pain on her chest wall. She denies any pain but she just doesn't feel good as good. Her appetite is low but she still eats. Her BM is fine and she denies vomiting. She says Dr. KClaiborne Billingssays she is allergic to IV contrast. She is taking XANAX for  sleep and has been for 20 years. She gets it from Dr. WPablo Ledgergave her 1 half of it while on radiation. The past month she got less tablets than normal. She did not bring the bottle with her, but she is not sure who has been refilling or [facility]       PAST MEDICAL HISTORY: Past Medical History:  Diagnosis Date  . Anxiety   . Blood in urine   . CAD (coronary artery disease)    non obstructive by cath  . Cancer ([facility]    breast- left  . Depression   . GERD (gastroesophageal reflux disease)   . Hypertension   . Knee fracture   . Personal history of radiation therapy 2017  . TR (tricuspid regurgitation)    mild by Echo 12/2008 EF >55%    PAST SURGICAL HISTORY: Past Surgical History:  Procedure Laterality Date  . ABDOMINAL AORTAGRAM N/A 09/08/2011   Procedure: ABDOMINAL AMaxcine Ham  Surgeon: TTroy Sine MD;  [facility];  Service: Cardiovascular;  Laterality: N/A;  . ABDOMINAL HYSTERECTOMY    . BREAST EXCISIONAL BIOPSY Left 2017   X3  . BREAST SURGERY  08-30-10   mastectomy  . CARDIAC CATHETERIZATION  08/2011   20% LAD stenosis, 20% diagonal stenosis, 10-20% proximal dominant RCA stenosis  . COLON SURGERY    . LEFT HEART CATHETERIZATION WITH CORONARY ANGIOGRAM N/A 09/08/2011   Procedure: LEFT HEART CATHETERIZATION WITH CORONARY ANGIOGRAM;  Surgeon: TTroy Sine MD;  [facility];  Service: Cardiovascular;  Laterality: N/A;  . MASTECTOMY    . MINOR BREAST BIOPSY Left 11/25/2015   Procedure: MINOR EXCISION MASS LEFT CHEST WALL;  Surgeon: PAutumn MessingIII, MD;  [place]  Service: General;  Laterality: Left;  . MINOR BREAST BIOPSY Left 07/17/2016   Procedure: EXCISION OF 2 LEFT CHEST WALL MASSES;  Surgeon: PAutumn MessingIII, MD;  [place]  Service: General;  Laterality: Left;  . TONSILLECTOMY      FAMILY HISTORY: Family History  Problem Relation Age of Onset  . Cancer Father         prostate  . Cancer Sister        breast  . Heart disease Sister   . Breast cancer Sister   . Cancer Brother        pancreatic  . Cancer Maternal Grandmother        colon  . Dementia Mother   . Sudden death Brother   . Heart attack Brother   . Heart attack Brother   . Arthritis Sister   . Breast cancer Maternal Aunt     SOCIAL HISTORY: Social History  Substance Use Topics  .  Smoking status: Former Smoker    Quit date: 11/15/1991  . Smokeless tobacco: Never Used  . Alcohol use 1.2 oz/week    2 Glasses of wine per week     Comment: 1 glass per week    ALLERGIES: Allergies  Allergen Reactions  . Contrast Media [Iodinated Diagnostic Agents] Anaphylaxis  . Penicillins Anaphylaxis and Swelling    Has patient had a PCN reaction causing immediate rash, facial/tongue/throat swelling, SOB or lightheadedness with hypotension: Yes Has patient had a PCN reaction causing severe rash involving mucus membranes or skin necrosis: No Has patient had a PCN reaction that required hospitalization Yes Has patient had a PCN reaction occurring within the last 10 years: No If all of the above answers are "NO", then may proceed with Cephalosporin use.   . Adhesive [Tape] Other (See Comments)    blisters  . Cephalexin Swelling  . Ciprofloxacin Swelling  . Demerol Nausea And Vomiting  . Sulfamethoxazole-Trimethoprim Itching and Swelling    REACTION: swelling/hives  . Quinolones Itching and Rash    REACTION: itching, rash Pt. Reports no problems with levaquin     MEDICATIONS:  Current Outpatient Prescriptions  Medication Sig Dispense Refill  . acetaminophen (TYLENOL) 500 MG tablet Take 1,000 mg by mouth daily as needed for moderate pain. Reported on 03/10/2016    . ALPRAZolam (XANAX) 0.25 MG tablet Take 1 tablet (0.25 mg total) by mouth at bedtime. Take 1/2 tablet prior to radiation and 1 tablet prior to bedtime. 30 tablet 2  . aspirin 81 MG chewable tablet Chew 81 mg by mouth once a week.  Reported on 03/10/2016    . CALCIUM-MAGNESIUM-VITAMIN D PO Take 1 tablet by mouth 2 (two) times daily.    . cetirizine (ZYRTEC) 10 MG tablet Take 10 mg by mouth as needed for allergies.     Marland Kitchen doxepin (SINEQUAN) 10 MG capsule Take 10 mg by mouth at bedtime.     . fluticasone (FLONASE) 50 MCG/ACT nasal spray Place 1 spray into both nostrils daily as needed for allergies.     Marland Kitchen guaiFENesin (MUCINEX) 600 MG 12 hr tablet Take 600 mg by mouth daily as needed for cough or to loosen phlegm. Reported on 03/28/2016    . ipratropium (ATROVENT) 0.06 % nasal spray Place 2 sprays into both nostrils 4 (four) times daily. 15 mL 0  . losartan (COZAAR) 25 MG tablet Take 25 mg by mouth daily.   0  . nitroGLYCERIN (NITROSTAT) 0.4 MG SL tablet Place 1 tablet (0.4 mg total) under the tongue every 5 (five) minutes as needed for chest pain. <PLEASE MAKE APPOINTMENT> 25 tablet 3  . potassium chloride (K-DUR) 10 MEQ tablet Take 10 mEq by mouth daily.    . pseudoephedrine-acetaminophen (TYLENOL SINUS) 30-500 MG TABS tablet Take 1 tablet by mouth every 4 (four) hours as needed.    . tamoxifen (NOLVADEX) 20 MG tablet Take 1 tablet (20 mg total) by mouth daily. 90 tablet 1  . triamcinolone (KENALOG) 0.025 % cream Apply 1 application topically 2 (two) times daily. 30 g 0   No current facility-administered medications for this visit.       REVIEW OF SYSTEMS: A 10 point review of systems was completed and is negative except as noted above.   Health Maintenance  Mammogram: 03/01/2017   PHYSICAL EXAMINATION: BP 126/69 (BP Location: Right Arm, Patient Position: Sitting)   Pulse 73   Temp 98.3 F (36.8 C) (Oral)   Resp 18   Ht 5' 1" (  1.549 m)   Wt 125 lb 1.6 oz (56.7 kg)   SpO2 100%   BMI 23.64 kg/m    GENERAL: Patient is a well appearing female in no acute distress HEENT:  Sclerae anicteric.  Oropharynx clear and moist. No ulcerations or evidence of oropharyngeal candidiasis. Neck is supple.  NODES:   There is no  cervical , supraclavicular, or axillary lymphadenopathy palpated.  LUNGS:  Clear to auscultation bilaterally.  No wheezes or rhonchi. HEART:  Regular rate and rhythm. No murmur appreciated. ABDOMEN:  Soft, nontender.  Positive, normoactive bowel sounds. No organomegaly palpated. MSK:  No focal spinal tenderness to palpation. Full range of motion bilaterally in the upper extremities. EXTREMITIES:  No peripheral edema.   SKIN:  Clear with no obvious rashes or skin changes.  NEURO:  Nonfocal. Well oriented.  Appropriate affect. BREAST EXAM:  Left breast is surgically absent,  incision in the left frontal chest has healed well. Small nodularity along the incision line, unchanged, nontender. Right breast and bilateral axilla notable for no masses or nodules.    ECOG FS:  Grade 1 - Symptomatic but completely ambulatory   LAB RESULTS: CBC Latest Ref Rng & Units 03/21/2017 01/23/2017 12/17/2016  WBC 3.9 - 10.3 10e3/uL 4.5 2.2(L) 4.5  Hemoglobin 11.6 - 15.9 g/dL 12.8 12.5 12.2  Hematocrit 34.8 - 46.6 % 37.9 37.5 36.6  Platelets 145 - 400 10e3/uL 117(L) 160 94(L)      CMP Latest Ref Rng & Units 03/21/2017 01/23/2017 12/17/2016  Glucose 70 - 140 mg/dl 79 104 93  BUN 7.0 - 26.0 mg/dL 17.9 15.8 16  Creatinine 0.6 - 1.1 mg/dL 0.8 0.9 0.79  Sodium 136 - 145 mEq/L 142 143 140  Potassium 3.5 - 5.1 mEq/L 4.0 4.0 4.1  Chloride 101 - 111 mmol/L - - 110  CO2 22 - 29 mEq/L _0 Calcium 8.4 - 10.4 mg/dL 9.2 9.0 8.6(L)  Total Protein 6.4 - 8.3 g/dL 5.8(L) 5.8(L) 6.0(L)  Total Bilirubin 0.20 - 1.20 mg/dL 0.49 0.39 0.7  Alkaline Phos 40 - 150 U/L 56 49 51  AST 5 - 34 U/L _1 ALT 0 - 55 U/L 10 14 13(L)   PATHOLOGY REPORT Diagnosis 11/25/2015 Soft tissue mass, simple excision, Left chest wall - INVASIVE LOBULAR CARCINOMA, PRESENT AT NON ORIENTED TISSUE EDGE(S). - SEE COMMENT. Microscopic Comment The carcinoma appears grade 2. A breast prognostic profile will be performed and the results reported  separately. (JDP:gt, 11/26/15).  Results: IMMUNOHISTOCHEMICAL AND MORPHOMETRIC ANALYSIS PERFORMED MANUALLY Estrogen Receptor: 95%, POSITIVE, STRONG STAINING INTENSITY Progesterone Receptor: 60%, POSITIVE, STRONG STAINING INTENSITY Results: HER2 - NEGATIVE RATIO OF HER2/CEP17 SIGNALS 1.07 AVERAGE HER2 COPY NUMBER PER CELL 2.90  Diagnosis 07/17/2016 1. Soft tissue mass, simple excision, Chest wall medial left - INVASIVE LOBULAR CARCINOMA, SEE COMMENT. - NODULE OF FIBROSIS AND CALCIFICATION. 2. Soft tissue mass, simple excision, Chest wall lateral left - INVASIVE LOBULAR CARCINOMA, SEE COMMENT. Microscopic Comment 1. -2. Both parts reveal foci of invasive lobular carcinoma. There is only limited tumor in part #1. Tumor does extend to the uninked edges of both parts. The carcinoma appears grade 2. A prognostic panel has been ordered on part #2.  2. PROGNOSTIC INDICATORS Results: IMMUNOHISTOCHEMICAL AND MORPHOMETRIC ANALYSIS PERFORMED MANUALLY Estrogen Receptor: 60%, POSITIVE, STRONG STAINING INTENSITY Progesterone Receptor: 90%, POSITIVE, STRONG STAINING INTENSITY Proliferation Marker Ki67: 10%  Results: HER2 - NEGATIVE RATIO OF HER2/CEP17 SIGNALS 1.20 AVERAGE HER2 COPY NUMBER PER CELL 2.40   RADIOGRAPHIC STUDIES:  Mammogram 03/01/2017 IMPRESSION: No mammographic evidence of malignancy. A result letter of this screening mammogram will be mailed directly to the patient. RECOMMENDATION: Screening mammogram in one year   CT chest, abdomen and pelvis wo contrast  07/27/2016 IMPRESSION: Sigmoid diverticulosis without acute diverticulitis. No bowel obstruction or acute inflammation. Degenerative disc disease and facet arthropathy of the lower lumbar spine with slight grade 1 anterolisthesis of L4 on L5. Stable appearing simple left-sided renal cysts.  PET 07/14/2016 IMPRESSION: 1. No findings of recurrent malignancy. 2. Radiation fibrosis in the lingula. A small left axillary  lymph node is not hypermetabolic and only 6 mm in diameter. This is in the vicinity of the prior axillary cyst. 3. Pelvic floor laxity.   ASSESSMENT/PLAN: 83 y.o. female with:  1. Cancer of the central portion of left female breast, Stage IIB, T2 N1 invasive lobular carcinoma, grade 2, ER 95%, PR 97%, Ki-67 17%, HER-2/neu no amplification, diagnosed in 07/2010, local chest wall recurrence in 09/2015   -She was on adjuvant tamoxifen for 4.5 years, stopped on her own. -She unfortunately developed local recurrence 8 months after she stopped tamoxifen. -I previously reviewed her surgical pathology findings, which is consistent with lobular carcinoma recurrence from her previous breast cancer. -The tumor was surgically removed, with positive margins. -Her restaging CT scan and bone scan in 12/2015 was negative for distant metastasis. -She has completed chest wall radiation, however developed 2-3 small nodules during the radiation and anastrozole therapy, supicious for residual cancer. US showed a 4 mm and 6 mm subcutaneous nodule, partially calcified, highly suspicious for local recurrence. -She subsequently underwent a second chest wall surgery for two soft tissue resection, I previously reviewed her pathology results, both showed invasive lobular carcinoma, margins were positive. I spoke with Dr. Marlou Starks, no more surgery will be offered, because complete surgical resection is unlikely. Patient is also not in favor of more extensive surgery. -We previously discussed her that her cancer is unlikely curable at this stage, and our goal of therapy is palliative for disease control, and prolong her life. She voiced good understanding. -I have changed her endocrine therapy to Tamoxifen, she is tolerating very well, we'll continue indefinitely. -Her last PET scan and CT abdomen and pelvis in September 2017 were negative for metastatic disease -She is clinically doing well, exam was unremarkable, lab reviewed  with her, no clinical concern for chest wall disease progression or metastatic disease for now. -Her last mammogram from 03/28/2017 was negative for lesions in the right breast. -Labs and mammogram reviewed. Her liver enzymes are well.  -I will order PET scan in 2 weeks for surveillance, to ruled out distant metastasis.   2. HTN, CAD  -She'll continue follow-up with her primary care physician and cardiologist  3. Left shoulder blade pain -Chronic, intermittent, mild, for the past year. Prior bone scan was negative -Possible muscular related, we'll continue monitoring -near resolved lately   4. Osteoporosis -her last DEXA scan in 06/2012 showed osteoporosis  -I previously encouraged her to continue calcium and Vit D -I previously encouraged her to repeat DEXA at her PCP office   Plan -Continue tamoxifen  -Order PET scan in 2 weeks -refill XANAX today -f/u in 3 weeks    I spent 20 minutes counseling the patient face to face.  The total time spent in the appointment was 25 minutes.  Truitt Merle  03/21/2017   This document serves as a record of services personally performed by Truitt Merle, MD. It was created on her  behalf by Joslyn Devon, a trained medical scribe. The creation of this record is based on the scribe's personal observations and the provider's statements to them. This document has been checked and approved by the attending provider.

## 2017-03-21 ENCOUNTER — Ambulatory Visit (HOSPITAL_BASED_OUTPATIENT_CLINIC_OR_DEPARTMENT_OTHER): Payer: PPO | Admitting: Hematology

## 2017-03-21 ENCOUNTER — Other Ambulatory Visit (HOSPITAL_BASED_OUTPATIENT_CLINIC_OR_DEPARTMENT_OTHER): Payer: PPO

## 2017-03-21 ENCOUNTER — Telehealth: Payer: Self-pay | Admitting: Hematology

## 2017-03-21 VITALS — BP 126/69 | HR 73 | Temp 98.3°F | Resp 18 | Ht 61.0 in | Wt 125.1 lb

## 2017-03-21 DIAGNOSIS — I10 Essential (primary) hypertension: Secondary | ICD-10-CM

## 2017-03-21 DIAGNOSIS — Z170 Estrogen receptor positive status [ER+]: Secondary | ICD-10-CM

## 2017-03-21 DIAGNOSIS — C50112 Malignant neoplasm of central portion of left female breast: Secondary | ICD-10-CM

## 2017-03-21 DIAGNOSIS — C44501 Unspecified malignant neoplasm of skin of breast: Secondary | ICD-10-CM | POA: Diagnosis not present

## 2017-03-21 DIAGNOSIS — I2510 Atherosclerotic heart disease of native coronary artery without angina pectoris: Secondary | ICD-10-CM | POA: Diagnosis not present

## 2017-03-21 LAB — CBC WITH DIFFERENTIAL/PLATELET
BASO%: 1.1 % (ref 0.0–2.0)
BASOS ABS: 0 10*3/uL (ref 0.0–0.1)
EOS ABS: 0.8 10*3/uL — AB (ref 0.0–0.5)
EOS%: 17 % — ABNORMAL HIGH (ref 0.0–7.0)
HEMATOCRIT: 37.9 % (ref 34.8–46.6)
HEMOGLOBIN: 12.8 g/dL (ref 11.6–15.9)
LYMPH%: 13.9 % — ABNORMAL LOW (ref 14.0–49.7)
MCH: 31.3 pg (ref 25.1–34.0)
MCHC: 33.7 g/dL (ref 31.5–36.0)
MCV: 92.8 fL (ref 79.5–101.0)
MONO#: 0.3 10*3/uL (ref 0.1–0.9)
MONO%: 7.6 % (ref 0.0–14.0)
NEUT%: 60.4 % (ref 38.4–76.8)
NEUTROS ABS: 2.7 10*3/uL (ref 1.5–6.5)
PLATELETS: 117 10*3/uL — AB (ref 145–400)
RBC: 4.08 10*6/uL (ref 3.70–5.45)
RDW: 12.9 % (ref 11.2–14.5)
WBC: 4.5 10*3/uL (ref 3.9–10.3)
lymph#: 0.6 10*3/uL — ABNORMAL LOW (ref 0.9–3.3)

## 2017-03-21 LAB — COMPREHENSIVE METABOLIC PANEL
ALBUMIN: 3.5 g/dL (ref 3.5–5.0)
ALK PHOS: 56 U/L (ref 40–150)
ALT: 10 U/L (ref 0–55)
ANION GAP: 8 meq/L (ref 3–11)
AST: 20 U/L (ref 5–34)
BILIRUBIN TOTAL: 0.49 mg/dL (ref 0.20–1.20)
BUN: 17.9 mg/dL (ref 7.0–26.0)
CALCIUM: 9.2 mg/dL (ref 8.4–10.4)
CO2: 26 mEq/L (ref 22–29)
Chloride: 108 mEq/L (ref 98–109)
Creatinine: 0.8 mg/dL (ref 0.6–1.1)
EGFR: 64 mL/min/{1.73_m2} — AB (ref >90)
GLUCOSE: 79 mg/dL (ref 70–140)
POTASSIUM: 4 meq/L (ref 3.5–5.1)
Sodium: 142 mEq/L (ref 136–145)
TOTAL PROTEIN: 5.8 g/dL — AB (ref 6.4–8.3)

## 2017-03-21 MED ORDER — ALPRAZOLAM 0.25 MG PO TABS: 0.2500 mg | ORAL_TABLET | Freq: Every day | ORAL | 2 refills | Status: DC

## 2017-03-21 NOTE — Telephone Encounter (Signed)
Gave patient AVS and calender per 5/23 los - f/u in 3 weeks . Pet scan to be scheduled by [facility]

## 2017-03-22 ENCOUNTER — Encounter: Payer: Self-pay | Admitting: Hematology

## 2017-03-22 LAB — CANCER ANTIGEN 27.29: CA 27.29: 32.7 U/mL (ref 0.0–38.6)

## 2017-03-23 ENCOUNTER — Other Ambulatory Visit: Payer: Self-pay | Admitting: *Deleted

## 2017-03-23 MED ORDER — ALPRAZOLAM 0.25 MG PO TABS: ORAL_TABLET | ORAL | 2 refills | Status: DC

## 2017-04-02 NOTE — Progress Notes (Addendum)
[place]  Telephone:([phone]) [phone] Fax:(336) [phone]  OFFICE PROGRESS NOTE  PATIENT: Gloria [name]   DOB: 13-Sep-1928  MR#: [id number]  GBT#:[id number]  XT:GGYIRSWNI, [name], MD [name], MD [name]. [name], MD  DIAGNOSIS:  An 83 y.o. [place] woman with invasive lobular carcinoma of the left breast diagnosed in 07/2010, chest wall local recurrence in 09/2015 .  Oncology History   Cancer of central portion of left female breast [facility])   Staging form: Breast, AJCC 7th Edition     Pathologic stage from 08/04/2010: Stage IIB (T2, N1a, cM0) - Signed by Truitt Merle, MD on 12/09/2015       Cancer of central portion of left female breast ([place])   08/03/2010 Mammogram     left nipple retraction, and the palpable mass subareolar left breast at the 6:00 position. Ultrasound confirmed the presence of a mass measuring 2.6 x 1.7 x 2.4 cm      08/04/2010 Initial Biopsy    left breast mass biopsy showed an invasive mammary carcinoma with lobular features. ER 95%, PR 97%, Ki-67 17%, HER-2/neu (-)      08/04/2010 Pathologic Stage    Stage IIB: T2 N1a      08/11/2010 Imaging    left breast mass 4.6 x 4.2 x 2.3 cm with enhancement distortion extending to the nipple retraction. There is questionable anterior mediastinal lymph node seen      07/2010 - 09/03/2010 Anti-estrogen oral therapy    neoadjuvant letrozole       09/03/2010 Surgery     left breast mastectomy with sentinel node biopsy on 09/03/2010.      08/2010 - 01/2015 Anti-estrogen oral therapy    Tamoxifen, pt stopped on her own       12/03/2010 - 01/23/2011 Radiation Therapy    left chest wall and axilla radiaiton       10/19/2015 Progression    Left chest wall recurrence, s/p resection on 11/25/2015 with positive margins ([facility])       12/10/2015 -  Anti-estrogen oral therapy    anastrozole 25m daily, which was switched to Tamoxifen on 06/30/2016 due to persistent disease.      01/03/2016 - 03/03/2016 Radiation Therapy     Radiation to left chest wall recurrence (12/06/2015-02/04/2016 - 44 Gy), pt progressed through the radiation, and had additional 10 fractions of radiation (02/21/16-03/03/2016 - total 70 Gy to the left chest wall)      07/14/2016 PET scan    PET 07/14/2016 IMPRESSION: 1. No findings of recurrent malignancy. 2. Radiation fibrosis in the lingula. A small left axillary lymph node is not hypermetabolic and only 6 mm in diameter. This is in the vicinity of the prior axillary cyst. 3. Pelvic floor laxity.      07/17/2016 Surgery    Chest wall soft tissue resection for local recurrence      07/17/2016 Pathology Results    Left chest wall two soft tissue resection both showed invasive lobular carcinoma, margins were positive. ER 95% positive, PR 60% positive, HER-2 negative.      07/27/2016 Imaging    CT chest, abdomen and pelvis wo contrast  07/27/2016 IMPRESSION: Sigmoid diverticulosis without acute diverticulitis. No bowel obstruction or acute inflammation. Degenerative disc disease and facet arthropathy of the lower lumbar spine with slight grade 1 anterolisthesis of L4 on L5. Stable appearing simple left-sided renal cysts.      03/01/2017 Mammogram    Mammogram 03/01/2017 IMPRESSION: No mammographic evidence of malignancy. A result  letter of this screening mammogram will be mailed directly to the patient. RECOMMENDATION: Screening mammogram in one year      04/10/2017 Imaging    PET IMPRESSION: 1. Stable exam.  No findings of recurrent malignancy. 2. Changes of external beam radiation again noted within the lingula. 3. Small left axillary lymph node exhibits mild increased uptake. Unchanged from previous exam.        CURRENT THERAPY:  Tamoxifen 57m daily    INTERVAL HISTORY:  [place]. She presents to the clinic today with her friend. She reports to having more left hip pain and cannot sleep on her left side due to this pain.  She also have some left chest  pain. She took some baby aspirin and her pain did not go away until that night and was gone in the morning. She has this chest pain a couple of times through out the day and last Sunday it was all day. She is slightly constipated.   Patient's daughter SLovey Newcomerwas called on the phone and talked to me about updates.    PAST MEDICAL HISTORY: Past Medical History:  Diagnosis Date  . Anxiety   . Blood in urine   . CAD (coronary artery disease)    non obstructive by cath  . Cancer ([facility]    breast- left  . Depression   . GERD (gastroesophageal reflux disease)   . Hypertension   . Knee fracture   . Personal history of radiation therapy 2017  . TR (tricuspid regurgitation)    mild by Echo 12/2008 EF >55%    PAST SURGICAL HISTORY: Past Surgical History:  Procedure Laterality Date  . ABDOMINAL AORTAGRAM N/A 09/08/2011   Procedure: ABDOMINAL AMaxcine Ham  Surgeon: TTroy Sine MD;  [facility];  Service: Cardiovascular;  Laterality: N/A;  . ABDOMINAL HYSTERECTOMY    . BREAST EXCISIONAL BIOPSY Left 2017   X3  . BREAST SURGERY  08-30-10   mastectomy  . CARDIAC CATHETERIZATION  08/2011   20% LAD stenosis, 20% diagonal stenosis, 10-20% proximal dominant RCA stenosis  . COLON SURGERY    . LEFT HEART CATHETERIZATION WITH CORONARY ANGIOGRAM N/A 09/08/2011   Procedure: LEFT HEART CATHETERIZATION WITH CORONARY ANGIOGRAM;  Surgeon: TTroy Sine MD;  [facility];  Service: Cardiovascular;  Laterality: N/A;  . MASTECTOMY    . MINOR BREAST BIOPSY Left 11/25/2015   Procedure: MINOR EXCISION MASS LEFT CHEST WALL;  Surgeon: PAutumn MessingIII, MD;  [facility]  Service: General;  Laterality: Left;  . MINOR BREAST BIOPSY Left 07/17/2016   Procedure: EXCISION OF 2 LEFT CHEST WALL MASSES;  Surgeon: PAutumn MessingIII, MD;  [place] [facility]  Service: General;  Laterality: Left;  . TONSILLECTOMY      FAMILY HISTORY: Family History  Problem Relation  Age of Onset  . Cancer Father        prostate  . Cancer Sister        breast  . Heart disease Sister   . Breast cancer Sister   . Cancer Brother        pancreatic  . Cancer Maternal Grandmother        colon  . Dementia Mother   . Sudden death Brother   . Heart attack Brother   . Heart attack Brother   . Arthritis Sister   . Breast cancer Maternal Aunt     SOCIAL HISTORY: Social History  Substance Use Topics  .  Smoking status: Former Smoker    Quit date: 11/15/1991  . Smokeless tobacco: Never Used  . Alcohol use 1.2 oz/week    2 Glasses of wine per week     Comment: 1 glass per week    ALLERGIES: Allergies  Allergen Reactions  . Contrast Media [Iodinated Diagnostic Agents] Anaphylaxis  . Penicillins Anaphylaxis and Swelling    Has patient had a PCN reaction causing immediate rash, facial/tongue/throat swelling, SOB or lightheadedness with hypotension: Yes Has patient had a PCN reaction causing severe rash involving mucus membranes or skin necrosis: No Has patient had a PCN reaction that required hospitalization Yes Has patient had a PCN reaction occurring within the last 10 years: No If all of the above answers are "NO", then may proceed with Cephalosporin use.   . Adhesive [Tape] Other (See Comments)    blisters  . Cephalexin Swelling  . Ciprofloxacin Swelling  . Demerol Nausea And Vomiting  . Sulfamethoxazole-Trimethoprim Itching and Swelling    REACTION: swelling/hives  . Quinolones Itching and Rash    REACTION: itching, rash Pt. Reports no problems with levaquin     MEDICATIONS:  Current Outpatient Prescriptions  Medication Sig Dispense Refill  . acetaminophen (TYLENOL) 500 MG tablet Take 1,000 mg by mouth daily as needed for moderate pain. Reported on 03/10/2016    . ALPRAZolam (XANAX) 0.25 MG tablet Take 1/2 tablet in am and 1 tablet prior to bedtime. 45 tablet 2  . aspirin 81 MG chewable tablet Chew 81 mg by mouth once a week. Reported on 03/10/2016    .  CALCIUM-MAGNESIUM-VITAMIN D PO Take 1 tablet by mouth 2 (two) times daily.    . cetirizine (ZYRTEC) 10 MG tablet Take 10 mg by mouth as needed for allergies.     Marland Kitchen doxepin (SINEQUAN) 10 MG capsule Take 10 mg by mouth at bedtime.     . fluticasone (FLONASE) 50 MCG/ACT nasal spray Place 1 spray into both nostrils daily as needed for allergies.     Marland Kitchen guaiFENesin (MUCINEX) 600 MG 12 hr tablet Take 600 mg by mouth daily as needed for cough or to loosen phlegm. Reported on 03/28/2016    . ipratropium (ATROVENT) 0.06 % nasal spray Place 2 sprays into both nostrils 4 (four) times daily. 15 mL 0  . losartan (COZAAR) 25 MG tablet Take 25 mg by mouth daily.   0  . nitroGLYCERIN (NITROSTAT) 0.4 MG SL tablet Place 1 tablet (0.4 mg total) under the tongue every 5 (five) minutes as needed for chest pain. <PLEASE MAKE APPOINTMENT> 25 tablet 3  . potassium chloride (K-DUR) 10 MEQ tablet Take 10 mEq by mouth daily.    . pseudoephedrine-acetaminophen (TYLENOL SINUS) 30-500 MG TABS tablet Take 1 tablet by mouth every 4 (four) hours as needed.    . tamoxifen (NOLVADEX) 20 MG tablet Take 1 tablet (20 mg total) by mouth daily. 90 tablet 1  . triamcinolone (KENALOG) 0.025 % cream Apply 1 application topically 2 (two) times daily. 30 g 0   No current facility-administered medications for this visit.       REVIEW OF SYSTEMS: A 10 point review of systems was completed and is negative except as noted above.    PHYSICAL EXAMINATION: BP 127/75 (BP Location: Left Arm, Patient Position: Sitting)   Pulse 82   Temp 98.5 F (36.9 C) (Oral)   Resp 18   Ht _0  (1.549 m)   Wt 126 lb (57.2 kg)   SpO2 100%   BMI 23.81  kg/m    GENERAL: Patient is a well appearing female in no acute distress HEENT:  Sclerae anicteric.  Oropharynx clear and moist. No ulcerations or evidence of oropharyngeal candidiasis. Neck is supple.  NODES:   There is no cervical , supraclavicular, or axillary lymphadenopathy palpated.  LUNGS:  Clear  to auscultation bilaterally.  No wheezes or rhonchi. HEART:  Regular rate and rhythm. No murmur appreciated.  Chest:  ABDOMEN:  Soft, nontender.  Positive, normoactive bowel sounds. No organomegaly palpated.  MSK:  No focal spinal tenderness to palpation. Full range of motion bilaterally in the upper extremities. EXTREMITIES:  No peripheral edema.   SKIN:  Clear with no obvious rashes or skin changes.  NEURO:  Nonfocal. Well oriented.  Appropriate affect. BREAST EXAM:  Left breast is surgically absent,  incision in the left frontal chest has healed well. Small nodularity along the incision line, unchanged, nontender. Right breast and bilateral axilla notable for no masses or nodules. stable size in nodule size, no palpable lymphadenopathy    ECOG FS:  Grade 1 - Symptomatic but completely ambulatory   LAB RESULTS: CBC Latest Ref Rng & Units 03/21/2017 01/23/2017 12/17/2016  WBC 3.9 - 10.3 10e3/uL 4.5 2.2(L) 4.5  Hemoglobin 11.6 - 15.9 g/dL 12.8 12.5 12.2  Hematocrit 34.8 - 46.6 % 37.9 37.5 36.6  Platelets 145 - 400 10e3/uL 117(L) 160 94(L)      CMP Latest Ref Rng & Units 03/21/2017 01/23/2017 12/17/2016  Glucose 70 - 140 mg/dl 79 104 93  BUN 7.0 - 26.0 mg/dL 17.9 15.8 16  Creatinine 0.6 - 1.1 mg/dL 0.8 0.9 0.79  Sodium 136 - 145 mEq/L 142 143 140  Potassium 3.5 - 5.1 mEq/L 4.0 4.0 4.1  Chloride 101 - 111 mmol/L - - 110  CO2 22 - 29 mEq/L _0 Calcium 8.4 - 10.4 mg/dL 9.2 9.0 8.6(L)  Total Protein 6.4 - 8.3 g/dL 5.8(L) 5.8(L) 6.0(L)  Total Bilirubin 0.20 - 1.20 mg/dL 0.49 0.39 0.7  Alkaline Phos 40 - 150 U/L 56 49 51  AST 5 - 34 U/L _1 ALT 0 - 55 U/L 10 14 13(L)   PATHOLOGY REPORT Diagnosis 11/25/2015 Soft tissue mass, simple excision, Left chest wall - INVASIVE LOBULAR CARCINOMA, PRESENT AT NON ORIENTED TISSUE EDGE(S). - SEE COMMENT. Microscopic Comment The carcinoma appears grade 2. A breast prognostic profile will be performed and the results reported  separately. (JDP:gt, 11/26/15).  Results: IMMUNOHISTOCHEMICAL AND MORPHOMETRIC ANALYSIS PERFORMED MANUALLY Estrogen Receptor: 95%, POSITIVE, STRONG STAINING INTENSITY Progesterone Receptor: 60%, POSITIVE, STRONG STAINING INTENSITY Results: HER2 - NEGATIVE RATIO OF HER2/CEP17 SIGNALS 1.07 AVERAGE HER2 COPY NUMBER PER CELL 2.90  Diagnosis 07/17/2016 1. Soft tissue mass, simple excision, Chest wall medial left - INVASIVE LOBULAR CARCINOMA, SEE COMMENT. - NODULE OF FIBROSIS AND CALCIFICATION. 2. Soft tissue mass, simple excision, Chest wall lateral left - INVASIVE LOBULAR CARCINOMA, SEE COMMENT. Microscopic Comment 1. -2. Both parts reveal foci of invasive lobular carcinoma. There is only limited tumor in part #1. Tumor does extend to the uninked edges of both parts. The carcinoma appears grade 2. A prognostic panel has been ordered on part #2.  2. PROGNOSTIC INDICATORS Results: IMMUNOHISTOCHEMICAL AND MORPHOMETRIC ANALYSIS PERFORMED MANUALLY Estrogen Receptor: 60%, POSITIVE, STRONG STAINING INTENSITY Progesterone Receptor: 90%, POSITIVE, STRONG STAINING INTENSITY Proliferation Marker Ki67: 10%  Results: HER2 - NEGATIVE RATIO OF HER2/CEP17 SIGNALS 1.20 AVERAGE HER2 COPY NUMBER PER CELL 2.40   RADIOGRAPHIC STUDIES:  PET 04/10/17 IMPRESSION: 1. Stable exam.  No findings of recurrent malignancy. 2. Changes of external beam radiation again noted within the lingula. 3. Small left axillary lymph node exhibits mild increased uptake. Unchanged from previous exam.   Mammogram 03/01/2017 IMPRESSION: No mammographic evidence of malignancy. A result letter of this screening mammogram will be mailed directly to the patient. RECOMMENDATION: Screening mammogram in one year   CT chest, abdomen and pelvis wo contrast  07/27/2016 IMPRESSION: Sigmoid diverticulosis without acute diverticulitis. No bowel obstruction or acute inflammation. Degenerative disc disease and facet arthropathy of  the lower lumbar spine with slight grade 1 anterolisthesis of L4 on L5. Stable appearing simple left-sided renal cysts.  PET 07/14/2016 IMPRESSION: 1. No findings of recurrent malignancy. 2. Radiation fibrosis in the lingula. A small left axillary lymph node is not hypermetabolic and only 6 mm in diameter. This is in the vicinity of the prior axillary cyst. 3. Pelvic floor laxity.   ASSESSMENT/PLAN: 83 y.o. female with:  1. Cancer of the central portion of left female breast, Stage IIB, T2 N1 invasive lobular carcinoma, grade 2, ER 95%, PR 97%, Ki-67 17%, HER-2/neu no amplification, diagnosed in 07/2010, local chest wall recurrence in 09/2015   -She was on adjuvant tamoxifen for 4.5 years, stopped on her own. -She unfortunately developed local recurrence 8 months after she stopped tamoxifen. -I previously reviewed her surgical pathology findings, which is consistent with lobular carcinoma recurrence from her previous breast cancer. -The tumor was surgically removed, with positive margins. -Her restaging CT scan and bone scan in 12/2015 was negative for distant metastasis. -She has completed chest wall radiation, however developed 2-3 small nodules during the radiation and anastrozole therapy, supicious for residual cancer. US showed a 4 mm and 6 mm subcutaneous nodule, partially calcified, highly suspicious for local recurrence. -She subsequently underwent a second chest wall surgery for two soft tissue resection, I previously reviewed her pathology results, both showed invasive lobular carcinoma, margins were positive. I spoke with Dr. Marlou Starks, no more surgery will be offered, because complete surgical resection is unlikely. Patient is also not in favor of more extensive surgery. -We previously discussed her that her cancer is unlikely curable at this stage, and our goal of therapy is palliative for disease control, and prolong her life. She voiced good understanding. -I have changed her  endocrine therapy to Tamoxifen, she is tolerating very well, we'll continue indefinitely. -I reviewed her restaging PET scan from 04/10/2017, which was negative for metastatic disease -She is clinically doing well, exam was unremarkable except the multiple small and stable nodules along the left mastectomy surgical incision, lab reviewed with her, no clinical concern for chest wall disease progression or metastatic disease for now. -Continue tamoxifen, she is tolerating well. Due to her advanced age and somewhat disabled and,I will hold on CDK4/6 inhibitor for now, but would definitely consider if she has disease progression.   2. HTN, CAD  -She'll continue follow-up with her primary care physician and cardiologist  3. Left shoulder blade pain -Chronic, intermittent, mild, for the past year. Prior bone scan was negative -Possible muscular related, we'll continue monitoring -near resolved lately   4. Osteoporosis -her last DEXA scan in 06/2012 showed osteoporosis  -I previously encouraged her to continue calcium and Vit D -I previously encouraged her to repeat DEXA at her PCP office   5. Chest Pain -Secondary to the small tumor nodules on the chest wall. -Manageable, she cannot take much pain medication.  Plan -I reviewed her mammograms and PET scan findings, and discussed  to that with her daughter on the phone  -lab and f/u in 2 months  -Continue tamoxifen    I spent 20 minutes counseling the patient face to face.  The total time spent in the appointment was 25 minutes.  Truitt Merle  04/11/2017  This document serves as a record of services personally performed by Truitt Merle, MD. It was created on her behalf by Joslyn Devon, a trained medical scribe. The creation of this record is based on the scribe's personal observations and the provider's statements to them. This document has been checked and approved by the attending provider.

## 2017-04-10 ENCOUNTER — Ambulatory Visit (HOSPITAL_COMMUNITY)
Admission: RE | Admit: 2017-04-10 | Discharge: 2017-04-10 | Disposition: A | Discharge status: Home / Self Care | Payer: PPO | Source: Ambulatory Visit | Attending: Hematology | Admitting: Hematology

## 2017-04-10 DIAGNOSIS — C50112 Malignant neoplasm of central portion of left female breast: Secondary | ICD-10-CM | POA: Diagnosis not present

## 2017-04-10 DIAGNOSIS — Z170 Estrogen receptor positive status [ER+]: Secondary | ICD-10-CM | POA: Diagnosis not present

## 2017-04-10 DIAGNOSIS — C50912 Malignant neoplasm of unspecified site of left female breast: Secondary | ICD-10-CM | POA: Diagnosis not present

## 2017-04-10 LAB — GLUCOSE, CAPILLARY: Glucose-Capillary: 85 mg/dL (ref 65–99)

## 2017-04-10 MED ORDER — FLUDEOXYGLUCOSE F - 18 (FDG) INJECTION
6.2900 | Freq: Once | INTRAVENOUS | Status: AC | PRN
  Administered 2017-04-10: 6.29 via INTRAVENOUS

## 2017-04-11 ENCOUNTER — Ambulatory Visit (HOSPITAL_BASED_OUTPATIENT_CLINIC_OR_DEPARTMENT_OTHER): Payer: PPO | Admitting: Hematology

## 2017-04-11 ENCOUNTER — Telehealth: Payer: Self-pay | Admitting: Hematology

## 2017-04-11 VITALS — BP 127/75 | HR 82 | Temp 98.5°F | Resp 18 | Ht 61.0 in | Wt 126.0 lb

## 2017-04-11 DIAGNOSIS — C50112 Malignant neoplasm of central portion of left female breast: Secondary | ICD-10-CM | POA: Diagnosis not present

## 2017-04-11 DIAGNOSIS — R0789 Other chest pain: Secondary | ICD-10-CM | POA: Diagnosis not present

## 2017-04-11 DIAGNOSIS — I2510 Atherosclerotic heart disease of native coronary artery without angina pectoris: Secondary | ICD-10-CM

## 2017-04-11 DIAGNOSIS — M25512 Pain in left shoulder: Secondary | ICD-10-CM | POA: Diagnosis not present

## 2017-04-11 DIAGNOSIS — Z170 Estrogen receptor positive status [ER+]: Secondary | ICD-10-CM

## 2017-04-11 DIAGNOSIS — M810 Age-related osteoporosis without current pathological fracture: Secondary | ICD-10-CM | POA: Diagnosis not present

## 2017-04-11 NOTE — Telephone Encounter (Signed)
Scheduled appt per 6/13 los -Gave patient AVS and calender per LOS - lab and f/u in 2 months.

## 2017-04-14 ENCOUNTER — Encounter: Payer: Self-pay | Admitting: Hematology

## 2017-04-18 DIAGNOSIS — N390 Urinary tract infection, site not specified: Secondary | ICD-10-CM | POA: Diagnosis not present

## 2017-04-18 DIAGNOSIS — B9620 Unspecified Escherichia coli [E. coli] as the cause of diseases classified elsewhere: Secondary | ICD-10-CM | POA: Diagnosis not present

## 2017-04-24 DIAGNOSIS — R197 Diarrhea, unspecified: Secondary | ICD-10-CM | POA: Diagnosis not present

## 2017-04-24 DIAGNOSIS — I10 Essential (primary) hypertension: Secondary | ICD-10-CM | POA: Diagnosis not present

## 2017-04-24 DIAGNOSIS — R300 Dysuria: Secondary | ICD-10-CM | POA: Diagnosis not present

## 2017-05-15 DIAGNOSIS — R300 Dysuria: Secondary | ICD-10-CM | POA: Diagnosis not present

## 2017-05-17 ENCOUNTER — Other Ambulatory Visit: Payer: Self-pay | Admitting: Hematology

## 2017-05-17 DIAGNOSIS — Z170 Estrogen receptor positive status [ER+]: Principal | ICD-10-CM

## 2017-05-17 DIAGNOSIS — C50112 Malignant neoplasm of central portion of left female breast: Secondary | ICD-10-CM

## 2017-05-21 DIAGNOSIS — H348322 Tributary (branch) retinal vein occlusion, left eye, stable: Secondary | ICD-10-CM | POA: Diagnosis not present

## 2017-05-21 DIAGNOSIS — H3562 Retinal hemorrhage, left eye: Secondary | ICD-10-CM | POA: Diagnosis not present

## 2017-05-21 DIAGNOSIS — H43392 Other vitreous opacities, left eye: Secondary | ICD-10-CM | POA: Diagnosis not present

## 2017-05-21 DIAGNOSIS — H353132 Nonexudative age-related macular degeneration, bilateral, intermediate dry stage: Secondary | ICD-10-CM | POA: Diagnosis not present

## 2017-05-25 ENCOUNTER — Telehealth: Payer: Self-pay | Admitting: *Deleted

## 2017-05-25 NOTE — Telephone Encounter (Signed)
Received call earlier from operator stating that pt wanted to be seen sooner d/t breast surgical site looking different.  Called pt & she states that surgical site is turned in & she has noticed this over last month but worse now.  Discussed with Dr Burr Medico & will send LOS to bring pt in next week in pm & pt knows that someone will call her.

## 2017-05-28 ENCOUNTER — Telehealth: Payer: Self-pay | Admitting: Hematology

## 2017-05-28 NOTE — Telephone Encounter (Signed)
Confirmed 8/2 appt at 315 pm per sch msg

## 2017-05-29 DIAGNOSIS — R300 Dysuria: Secondary | ICD-10-CM | POA: Diagnosis not present

## 2017-05-31 ENCOUNTER — Ambulatory Visit (HOSPITAL_BASED_OUTPATIENT_CLINIC_OR_DEPARTMENT_OTHER): Payer: PPO | Admitting: Hematology

## 2017-05-31 ENCOUNTER — Encounter: Payer: Self-pay | Admitting: Hematology

## 2017-05-31 ENCOUNTER — Other Ambulatory Visit (HOSPITAL_BASED_OUTPATIENT_CLINIC_OR_DEPARTMENT_OTHER): Payer: PPO

## 2017-05-31 ENCOUNTER — Telehealth: Payer: Self-pay | Admitting: Hematology

## 2017-05-31 VITALS — BP 136/86 | HR 75 | Temp 97.7°F | Resp 17 | Ht 61.0 in | Wt 127.2 lb

## 2017-05-31 DIAGNOSIS — K5900 Constipation, unspecified: Secondary | ICD-10-CM | POA: Diagnosis not present

## 2017-05-31 DIAGNOSIS — G893 Neoplasm related pain (acute) (chronic): Secondary | ICD-10-CM

## 2017-05-31 DIAGNOSIS — Z170 Estrogen receptor positive status [ER+]: Secondary | ICD-10-CM | POA: Diagnosis not present

## 2017-05-31 DIAGNOSIS — C50112 Malignant neoplasm of central portion of left female breast: Secondary | ICD-10-CM

## 2017-05-31 DIAGNOSIS — I10 Essential (primary) hypertension: Secondary | ICD-10-CM

## 2017-05-31 DIAGNOSIS — C44501 Unspecified malignant neoplasm of skin of breast: Secondary | ICD-10-CM

## 2017-05-31 DIAGNOSIS — R222 Localized swelling, mass and lump, trunk: Secondary | ICD-10-CM | POA: Diagnosis not present

## 2017-05-31 DIAGNOSIS — N390 Urinary tract infection, site not specified: Secondary | ICD-10-CM

## 2017-05-31 DIAGNOSIS — M810 Age-related osteoporosis without current pathological fracture: Secondary | ICD-10-CM

## 2017-05-31 LAB — CBC WITH DIFFERENTIAL/PLATELET
BASO%: 1.1 % (ref 0.0–2.0)
Basophils Absolute: 0.1 10*3/uL (ref 0.0–0.1)
EOS ABS: 0.3 10*3/uL (ref 0.0–0.5)
EOS%: 7.8 % — AB (ref 0.0–7.0)
HEMATOCRIT: 39.5 % (ref 34.8–46.6)
HGB: 13.1 g/dL (ref 11.6–15.9)
LYMPH%: 18.1 % (ref 14.0–49.7)
MCH: 30.7 pg (ref 25.1–34.0)
MCHC: 33.1 g/dL (ref 31.5–36.0)
MCV: 92.8 fL (ref 79.5–101.0)
MONO#: 0.4 10*3/uL (ref 0.1–0.9)
MONO%: 8.4 % (ref 0.0–14.0)
NEUT#: 2.9 10*3/uL (ref 1.5–6.5)
NEUT%: 64.6 % (ref 38.4–76.8)
Platelets: 122 10*3/uL — ABNORMAL LOW (ref 145–400)
RBC: 4.25 10*6/uL (ref 3.70–5.45)
RDW: 13.2 % (ref 11.2–14.5)
WBC: 4.5 10*3/uL (ref 3.9–10.3)
lymph#: 0.8 10*3/uL — ABNORMAL LOW (ref 0.9–3.3)

## 2017-05-31 LAB — COMPREHENSIVE METABOLIC PANEL
ALT: 8 U/L (ref 0–55)
AST: 19 U/L (ref 5–34)
Albumin: 3.7 g/dL (ref 3.5–5.0)
Alkaline Phosphatase: 46 U/L (ref 40–150)
Anion Gap: 7 mEq/L (ref 3–11)
BUN: 19.1 mg/dL (ref 7.0–26.0)
CALCIUM: 9.2 mg/dL (ref 8.4–10.4)
CHLORIDE: 106 meq/L (ref 98–109)
CO2: 29 meq/L (ref 22–29)
CREATININE: 1 mg/dL (ref 0.6–1.1)
EGFR: 53 mL/min/{1.73_m2} — ABNORMAL LOW (ref >90)
Glucose: 91 mg/dl (ref 70–140)
POTASSIUM: 4.4 meq/L (ref 3.5–5.1)
SODIUM: 141 meq/L (ref 136–145)
Total Bilirubin: 0.53 mg/dL (ref 0.20–1.20)
Total Protein: 6.3 g/dL — ABNORMAL LOW (ref 6.4–8.3)

## 2017-05-31 NOTE — Telephone Encounter (Signed)
Gave patient avs report and calendar for August. °

## 2017-05-31 NOTE — Progress Notes (Signed)
[place]  [phone] [phone]  OFFICE PROGRESS NOTE  PATIENT: Gloria [name]   DOB: 07/24/1928  MR#: [id number]  GEZ#:[id number]  YT:[id number], [name], MD Gloria Edison, MD Gloria Hines. [name], MD  DIAGNOSIS:  An 83 y.o. [place] woman with invasive lobular carcinoma of the left breast diagnosed in 07/2010, chest wall local recurrence in 09/2015 .  Oncology History   Cancer of central portion of left female breast [facility])   Staging form: Breast, AJCC 7th Edition     Pathologic stage from 08/04/2010: Stage IIB (T2, N1a, cM0) - Signed by Truitt Merle, MD on 12/09/2015       Cancer of central portion of left female breast ([place])   08/03/2010 Mammogram     left nipple retraction, and the palpable mass subareolar left breast at the 6:00 position. Ultrasound confirmed the presence of a mass measuring 2.6 x 1.7 x 2.4 cm      08/04/2010 Initial Biopsy    left breast mass biopsy showed an invasive mammary carcinoma with lobular features. ER 95%, PR 97%, Ki-67 17%, HER-2/neu (-)      08/04/2010 Pathologic Stage    Stage IIB: T2 N1a      08/11/2010 Imaging    left breast mass 4.6 x 4.2 x 2.3 cm with enhancement distortion extending to the nipple retraction. There is questionable anterior mediastinal lymph node seen      07/2010 - 09/03/2010 Anti-estrogen oral therapy    neoadjuvant letrozole       09/03/2010 Surgery     left breast mastectomy with sentinel node biopsy on 09/03/2010.      08/2010 - 01/2015 Anti-estrogen oral therapy    Tamoxifen, pt stopped on her own       12/03/2010 - 01/23/2011 Radiation Therapy    left chest wall and axilla radiaiton       10/19/2015 Progression    Left chest wall recurrence, s/p resection on 11/25/2015 with positive margins ([facility])       12/10/2015 -  Anti-estrogen oral therapy    anastrozole 14m daily, which was switched to Tamoxifen on 06/30/2016 due to persistent disease.      01/03/2016 - 03/03/2016 Radiation Therapy     Radiation to left chest wall recurrence (12/06/2015-02/04/2016 - 44 Gy), pt progressed through the radiation, and had additional 10 fractions of radiation (02/21/16-03/03/2016 - total 70 Gy to the left chest wall)      07/14/2016 PET scan    PET 07/14/2016 IMPRESSION: 1. No findings of recurrent malignancy. 2. Radiation fibrosis in the lingula. A small left axillary lymph node is not hypermetabolic and only 6 mm in diameter. This is in the vicinity of the prior axillary cyst. 3. Pelvic floor laxity.      07/17/2016 Surgery    Chest wall soft tissue resection for local recurrence      07/17/2016 Pathology Results    Left chest wall two soft tissue resection both showed invasive lobular carcinoma, margins were positive. ER 95% positive, PR 60% positive, HER-2 negative.      07/27/2016 Imaging    CT chest, abdomen and pelvis wo contrast  07/27/2016 IMPRESSION: Sigmoid diverticulosis without acute diverticulitis. No bowel obstruction or acute inflammation. Degenerative disc disease and facet arthropathy of the lower lumbar spine with slight grade 1 anterolisthesis of L4 on L5. Stable appearing simple left-sided renal cysts.      03/01/2017 Mammogram    Mammogram 03/01/2017 IMPRESSION: No mammographic evidence of malignancy. A result  letter of this screening mammogram will be mailed directly to the patient. RECOMMENDATION: Screening mammogram in one year      04/10/2017 Imaging    PET IMPRESSION: 1. Stable exam.  No findings of recurrent malignancy. 2. Changes of external beam radiation again noted within the lingula. 3. Small left axillary lymph node exhibits mild increased uptake. Unchanged from previous exam.        CURRENT THERAPY:  Tamoxifen 13m daily   INTERVAL HISTORY:  SChristelreturns for follow-up. She called last week and he requests to be seen earlier, due to her concern of the change on the left chest wall. She presents to the clinic today with her friend. Pt reports  sharp pain to her left chest wall. Pt has had a prior left breast mastectomy. She notes swelling to the left chest wall. Pt also reports being constipated. Her sx have improved since taking antibiotics. She states that she has had three UTI's since 04/04/17 that were treated with abx therapy. [facility] and had a UA culture result in E. coli. Pt notes that she is on day 3 of her 3rd round of abx and she will complete them in 4 days.   On review of systems, pt denies fever, chills, weight loss, decreased appetite, decreased energy levels. Pt denies abdominal pain, nausea, vomiting, blood in her stool. Pt reports constipations. Pt reports left chest wall pain and chest wall swelling.    PAST MEDICAL HISTORY: Past Medical History:  Diagnosis Date  . Anxiety   . Blood in urine   . CAD (coronary artery disease)    non obstructive by cath  . Cancer ([facility]    breast- left  . Depression   . GERD (gastroesophageal reflux disease)   . Hypertension   . Knee fracture   . Personal history of radiation therapy 2017  . TR (tricuspid regurgitation)    mild by Echo 12/2008 EF >55%    PAST SURGICAL HISTORY: Past Surgical History:  Procedure Laterality Date  . ABDOMINAL AORTAGRAM N/A 09/08/2011   Procedure: ABDOMINAL AMaxcine Ham  Surgeon: TTroy Sine MD;  [facility];  Service: Cardiovascular;  Laterality: N/A;  . ABDOMINAL HYSTERECTOMY    . BREAST EXCISIONAL BIOPSY Left 2017   X3  . BREAST SURGERY  08-30-10   mastectomy  . CARDIAC CATHETERIZATION  08/2011   20% LAD stenosis, 20% diagonal stenosis, 10-20% proximal dominant RCA stenosis  . COLON SURGERY    . LEFT HEART CATHETERIZATION WITH CORONARY ANGIOGRAM N/A 09/08/2011   Procedure: LEFT HEART CATHETERIZATION WITH CORONARY ANGIOGRAM;  Surgeon: TTroy Sine MD;  [facility];  Service: Cardiovascular;  Laterality: N/A;   . MASTECTOMY    . MINOR BREAST BIOPSY Left 11/25/2015   Procedure: MINOR EXCISION MASS LEFT CHEST WALL;  Surgeon: PAutumn MessingIII, MD;  [place] [facility]  Service: General;  Laterality: Left;  . MINOR BREAST BIOPSY Left 07/17/2016   Procedure: EXCISION OF 2 LEFT CHEST WALL MASSES;  Surgeon: PAutumn MessingIII, MD;  [place] [facility]  Service: General;  Laterality: Left;  . TONSILLECTOMY      FAMILY HISTORY: Family History  Problem Relation Age of Onset  . Cancer Father        prostate  . Cancer Sister        breast  . Heart disease Sister   .  Breast cancer Sister   . Cancer Brother        pancreatic  . Cancer Maternal Grandmother        colon  . Dementia Mother   . Sudden death Brother   . Heart attack Brother   . Heart attack Brother   . Arthritis Sister   . Breast cancer Maternal Aunt     SOCIAL HISTORY: Social History  Substance Use Topics  . Smoking status: Former Smoker    Quit date: 11/15/1991  . Smokeless tobacco: Never Used  . Alcohol use 1.2 oz/week    2 Glasses of wine per week     Comment: 1 glass per week    ALLERGIES: Allergies  Allergen Reactions  . Contrast Media [Iodinated Diagnostic Agents] Anaphylaxis  . Penicillins Anaphylaxis and Swelling    Has patient had a PCN reaction causing immediate rash, facial/tongue/throat swelling, SOB or lightheadedness with hypotension: Yes Has patient had a PCN reaction causing severe rash involving mucus membranes or skin necrosis: No Has patient had a PCN reaction that required hospitalization Yes Has patient had a PCN reaction occurring within the last 10 years: No If all of the above answers are "NO", then may proceed with Cephalosporin use.   . Adhesive [Tape] Other (See Comments)    blisters  . Cephalexin Swelling  . Ciprofloxacin Swelling  . Demerol Nausea And Vomiting  . Sulfamethoxazole-Trimethoprim Itching and Swelling    REACTION: swelling/hives  . Quinolones Itching  and Rash    REACTION: itching, rash Pt. Reports no problems with levaquin     MEDICATIONS:  Current Outpatient Prescriptions  Medication Sig Dispense Refill  . acetaminophen (TYLENOL) 500 MG tablet Take 1,000 mg by mouth daily as needed for moderate pain. Reported on 03/10/2016    . ALPRAZolam (XANAX) 0.25 MG tablet Take 1/2 tablet in am and 1 tablet prior to bedtime. 45 tablet 2  . aspirin 81 MG chewable tablet Chew 81 mg by mouth once a week. Reported on 03/10/2016    . CALCIUM-MAGNESIUM-VITAMIN D PO Take 1 tablet by mouth 2 (two) times daily.    . cetirizine (ZYRTEC) 10 MG tablet Take 10 mg by mouth as needed for allergies.     Marland Kitchen doxepin (SINEQUAN) 10 MG capsule Take 10 mg by mouth at bedtime.     . fluticasone (FLONASE) 50 MCG/ACT nasal spray Place 1 spray into both nostrils daily as needed for allergies.     Marland Kitchen guaiFENesin (MUCINEX) 600 MG 12 hr tablet Take 600 mg by mouth daily as needed for cough or to loosen phlegm. Reported on 03/28/2016    . ipratropium (ATROVENT) 0.06 % nasal spray Place 2 sprays into both nostrils 4 (four) times daily. 15 mL 0  . losartan (COZAAR) 25 MG tablet Take 25 mg by mouth daily.   0  . nitroGLYCERIN (NITROSTAT) 0.4 MG SL tablet Place 1 tablet (0.4 mg total) under the tongue every 5 (five) minutes as needed for chest pain. <PLEASE MAKE APPOINTMENT> 25 tablet 3  . potassium chloride (K-DUR) 10 MEQ tablet Take 10 mEq by mouth daily.    . pseudoephedrine-acetaminophen (TYLENOL SINUS) 30-500 MG TABS tablet Take 1 tablet by mouth every 4 (four) hours as needed.    . tamoxifen (NOLVADEX) 20 MG tablet take 1 tablet by mouth once daily 90 tablet 1  . triamcinolone (KENALOG) 0.025 % cream Apply 1 application topically 2 (two) times daily. 30 g 0   No current facility-administered medications for this visit.  REVIEW OF SYSTEMS:  A 10 point review of systems was completed and is negative except as noted above.    PHYSICAL EXAMINATION:  BP 136/86 (BP  Location: Right Arm, Patient Position: Sitting)   Pulse 75   Temp 97.7 F (36.5 C) (Oral)   Resp 17   Ht _0  (1.549 m)   Wt 127 lb 3.2 oz (57.7 kg)   SpO2 100%   BMI 24.03 kg/m    GENERAL: Patient is a well appearing female in no acute distress HEENT:  Sclerae anicteric.  Oropharynx clear and moist. No ulcerations or evidence of oropharyngeal candidiasis. Neck is supple.  NODES:   There is no cervical , supraclavicular, or axillary lymphadenopathy palpated.  LUNGS:  Clear to auscultation bilaterally.  No wheezes or rhonchi. HEART:  Regular rate and rhythm. No murmur appreciated.  Chest:  ABDOMEN:  Soft, nontender.  Positive, normoactive bowel sounds. No organomegaly palpated.  MSK:  No focal spinal tenderness to palpation. Full range of motion bilaterally in the upper extremities. EXTREMITIES:  No peripheral edema.   SKIN:  Clear with no obvious rashes or skin changes.  NEURO:  Nonfocal. Well oriented.  Appropriate affect. BREAST EXAM:  Left breast is surgically absent,  incision in the left frontal chest has healed well.  Mild soft tissue fullness and edema around the incision, with skin retraction. Multiple 5-15 mm nodules along the incision line, slightly larger than before. Soft tissues are slightly more tender than previous visits. Tissues swollen around incision line. Right breast and bilateral axilla notable for no masses or nodules.  RECTUM: external and internal hemorrhoids, no palpable abnormalities. Non-tender.   ECOG FS:  Grade 1 - Symptomatic but completely ambulatory   LAB RESULTS: CBC Latest Ref Rng & Units 05/31/2017 03/21/2017 01/23/2017  WBC 3.9 - 10.3 10e3/uL 4.5 4.5 2.2(L)  Hemoglobin 11.6 - 15.9 g/dL 13.1 12.8 12.5  Hematocrit 34.8 - 46.6 % 39.5 37.9 37.5  Platelets 145 - 400 10e3/uL 122(L) 117(L) 160      CMP Latest Ref Rng & Units 05/31/2017 03/21/2017 01/23/2017  Glucose 70 - 140 mg/dl 91 79 104  BUN 7.0 - 26.0 mg/dL 19.1 17.9 15.8  Creatinine 0.6 - 1.1 mg/dL  1.0 0.8 0.9  Sodium 136 - 145 mEq/L 141 142 143  Potassium 3.5 - 5.1 mEq/L 4.4 4.0 4.0  Chloride 101 - 111 mmol/L - - -  CO2 22 - 29 mEq/L _1 Calcium 8.4 - 10.4 mg/dL 9.2 9.2 9.0  Total Protein 6.4 - 8.3 g/dL 6.3(L) 5.8(L) 5.8(L)  Total Bilirubin 0.20 - 1.20 mg/dL 0.53 0.49 0.39  Alkaline Phos 40 - 150 U/L 46 56 49  AST 5 - 34 U/L _2 ALT 0 - 55 U/L _3 CA antigen 27.29 on 03/21/17 was 32.7  PATHOLOGY REPORT Diagnosis 11/25/2015 Soft tissue mass, simple excision, Left chest wall - INVASIVE LOBULAR CARCINOMA, PRESENT AT NON ORIENTED TISSUE EDGE(S). - SEE COMMENT. Microscopic Comment The carcinoma appears grade 2. A breast prognostic profile will be performed and the results reported separately. (JDP:gt, 11/26/15).  Results: IMMUNOHISTOCHEMICAL AND MORPHOMETRIC ANALYSIS PERFORMED MANUALLY Estrogen Receptor: 95%, POSITIVE, STRONG STAINING INTENSITY Progesterone Receptor: 60%, POSITIVE, STRONG STAINING INTENSITY Results: HER2 - NEGATIVE RATIO OF HER2/CEP17 SIGNALS 1.07 AVERAGE HER2 COPY NUMBER PER CELL 2.90  Diagnosis 07/17/2016 1. Soft tissue mass, simple excision, Chest wall medial left - INVASIVE LOBULAR CARCINOMA, SEE COMMENT. - NODULE OF FIBROSIS AND CALCIFICATION. 2. Soft tissue mass,  simple excision, Chest wall lateral left - INVASIVE LOBULAR CARCINOMA, SEE COMMENT. Microscopic Comment 1. -2. Both parts reveal foci of invasive lobular carcinoma. There is only limited tumor in part #1. Tumor does extend to the uninked edges of both parts. The carcinoma appears grade 2. A prognostic panel has been ordered on part #2.  2. PROGNOSTIC INDICATORS Results: IMMUNOHISTOCHEMICAL AND MORPHOMETRIC ANALYSIS PERFORMED MANUALLY Estrogen Receptor: 60%, POSITIVE, STRONG STAINING INTENSITY Progesterone Receptor: 90%, POSITIVE, STRONG STAINING INTENSITY Proliferation Marker Ki67: 10%  Results: HER2 - NEGATIVE RATIO OF HER2/CEP17 SIGNALS 1.20 AVERAGE HER2 COPY NUMBER  PER CELL 2.40   RADIOGRAPHIC STUDIES:  PET 04/10/17 IMPRESSION: 1. Stable exam.  No findings of recurrent malignancy. 2. Changes of external beam radiation again noted within the lingula. 3. Small left axillary lymph node exhibits mild increased uptake. Unchanged from previous exam.   Mammogram 03/01/2017 IMPRESSION: No mammographic evidence of malignancy. A result letter of this screening mammogram will be mailed directly to the patient. RECOMMENDATION: Screening mammogram in one year   CT chest, abdomen and pelvis wo contrast  07/27/2016 IMPRESSION: Sigmoid diverticulosis without acute diverticulitis. No bowel obstruction or acute inflammation. Degenerative disc disease and facet arthropathy of the lower lumbar spine with slight grade 1 anterolisthesis of L4 on L5. Stable appearing simple left-sided renal cysts.  PET 07/14/2016 IMPRESSION: 1. No findings of recurrent malignancy. 2. Radiation fibrosis in the lingula. A small left axillary lymph node is not hypermetabolic and only 6 mm in diameter. This is in the vicinity of the prior axillary cyst. 3. Pelvic floor laxity.   ASSESSMENT/PLAN: 83 y.o. female with: invasive lobular carcinoma of the left breast   1. Cancer of the central portion of left female breast, Stage IIB, T2 N1 invasive lobular carcinoma, grade 2, ER 95%, PR 97%, Ki-67 17%, HER-2/neu no amplification, diagnosed in 07/2010, local chest wall recurrence in 09/2015   -She was on adjuvant tamoxifen for 4.5 years, stopped on her own. -She unfortunately developed local recurrence 8 months after she stopped tamoxifen. -I previously reviewed her surgical pathology findings, which is consistent with lobular carcinoma recurrence from her previous breast cancer. -The tumor was surgically removed, with positive margins. -Her restaging CT scan and bone scan in 12/2015 was negative for distant metastasis. -She has completed chest wall radiation, however developed 2-3  small nodules during the radiation and anastrozole therapy, supicious for residual cancer. US showed a 4 mm and 6 mm subcutaneous nodule, partially calcified, highly suspicious for local recurrence. -She subsequently underwent a second chest wall surgery for two soft tissue resection, I previously reviewed her pathology results, both showed invasive lobular carcinoma, margins were positive. I spoke with Dr. Marlou Starks, no more surgery will be offered, because complete surgical resection is unlikely. Patient is also not in favor of more extensive surgery. - We previously discussed her that her cancer is unlikely curable at this stage, and our goal of therapy is palliative for disease control, and prolong her life. She voiced good understanding. - I have changed her endocrine therapy to Tamoxifen, she is tolerating very well. - I reviewed her restaging PET scan from 04/10/2017, which was negative for metastatic disease - She has developed more chest wall tenderness, the tumor nodules along the incision signs are slightly bigger and tender, I think she has had some disease progression. I recommend her to stop tamoxifen, and changed to fulvestrant injection -Pt will began Faslodex injections in 1 week and at that time, she will d/c Tamoxifen.  -I may  consider CDK4/6 inhibitor if her recurrent UTI resolves   2. HTN, CAD  - She'll continue follow-up with her primary care physician and cardiologist  3. Left shoulder blade pain - Chronic, intermittent, mild, for the past year. Prior bone scan was negative - Possible muscular related, we'll continue monitoring - near resolved lately    4. Osteoporosis - her last DEXA scan in 06/2012 showed osteoporosis  - I previously encouraged her to continue calcium and Vit D - I previously encouraged her to repeat DEXA at her PCP office   5. Chest Pain - Secondary to the small tumor nodules on the chest wall. - Manageable, she cannot take much pain medication.  6.  Recurrent UTI -She has had history episodes of UTI in the past few months, responded well to antibiotics. -She has a follow-up appointment with [facility] PA Merryl Hacker next week -We discussed option of prophylactic antibiotics, we'll consider a cystoscopy if needed   Plan -switch Tamoxifen to Faslodex injection, starting next week - Faslodex injection and f/u in the office in 3 weeks   - Follow up with [facility] on Tuesday, 06/05/17 with Rosario Jacks, PA, to discuss management of recurrent UTI  - Start taking probiotics -I called pt's daughter Gloria [name] after clinic, left a message and updated her   I spent 20 minutes counseling the patient face to face.  The total time spent in the appointment was 30 minutes.  Truitt Merle  05/31/2017   This document serves as a record of services personally performed by Truitt Merle, MD. It was created on her behalf by Steva Colder, a trained medical scribe. The creation of this record is based on the scribe's personal observations and the provider's statements to them. This document has been checked and approved by the attending provider.

## 2017-06-01 LAB — CANCER ANTIGEN 27.29: CA 27.29: 33.2 U/mL (ref 0.0–38.6)

## 2017-06-05 DIAGNOSIS — N3020 Other chronic cystitis without hematuria: Secondary | ICD-10-CM | POA: Diagnosis not present

## 2017-06-07 ENCOUNTER — Ambulatory Visit (HOSPITAL_BASED_OUTPATIENT_CLINIC_OR_DEPARTMENT_OTHER): Payer: PPO

## 2017-06-07 VITALS — BP 133/80 | HR 71 | Temp 97.9°F | Resp 18

## 2017-06-07 DIAGNOSIS — C44501 Unspecified malignant neoplasm of skin of breast: Secondary | ICD-10-CM | POA: Diagnosis not present

## 2017-06-07 DIAGNOSIS — C7802 Secondary malignant neoplasm of left lung: Secondary | ICD-10-CM | POA: Diagnosis not present

## 2017-06-07 DIAGNOSIS — C50112 Malignant neoplasm of central portion of left female breast: Secondary | ICD-10-CM

## 2017-06-07 DIAGNOSIS — Z170 Estrogen receptor positive status [ER+]: Principal | ICD-10-CM

## 2017-06-07 DIAGNOSIS — Z5111 Encounter for antineoplastic chemotherapy: Secondary | ICD-10-CM

## 2017-06-07 MED ORDER — FULVESTRANT 250 MG/5ML IM SOLN
500.0000 mg | INTRAMUSCULAR | Status: DC
  Administered 2017-06-07: 500 mg via INTRAMUSCULAR
  Filled 2017-06-07: qty 10

## 2017-06-07 NOTE — Patient Instructions (Signed)

## 2017-06-11 ENCOUNTER — Other Ambulatory Visit: Payer: PPO

## 2017-06-11 ENCOUNTER — Ambulatory Visit: Payer: PPO | Admitting: Hematology

## 2017-06-12 DIAGNOSIS — Z961 Presence of intraocular lens: Secondary | ICD-10-CM | POA: Diagnosis not present

## 2017-06-12 DIAGNOSIS — H348322 Tributary (branch) retinal vein occlusion, left eye, stable: Secondary | ICD-10-CM | POA: Diagnosis not present

## 2017-06-12 DIAGNOSIS — H353132 Nonexudative age-related macular degeneration, bilateral, intermediate dry stage: Secondary | ICD-10-CM | POA: Diagnosis not present

## 2017-06-12 DIAGNOSIS — H26493 Other secondary cataract, bilateral: Secondary | ICD-10-CM | POA: Diagnosis not present

## 2017-06-13 ENCOUNTER — Telehealth: Payer: Self-pay | Admitting: Hematology

## 2017-06-13 NOTE — Telephone Encounter (Signed)
sw pt to confirm r/s 8/16 appt to 8/23 per sch msg

## 2017-06-14 ENCOUNTER — Ambulatory Visit: Payer: PPO

## 2017-06-14 ENCOUNTER — Ambulatory Visit: Payer: PPO | Admitting: Hematology

## 2017-06-19 ENCOUNTER — Other Ambulatory Visit: Payer: Self-pay | Admitting: *Deleted

## 2017-06-21 ENCOUNTER — Telehealth: Payer: Self-pay | Admitting: Hematology

## 2017-06-21 ENCOUNTER — Ambulatory Visit (HOSPITAL_BASED_OUTPATIENT_CLINIC_OR_DEPARTMENT_OTHER): Payer: PPO

## 2017-06-21 ENCOUNTER — Ambulatory Visit (HOSPITAL_BASED_OUTPATIENT_CLINIC_OR_DEPARTMENT_OTHER): Payer: PPO | Admitting: Hematology

## 2017-06-21 VITALS — BP 133/69 | HR 73 | Temp 97.6°F | Resp 20 | Ht 61.0 in | Wt 127.7 lb

## 2017-06-21 DIAGNOSIS — Z87440 Personal history of urinary (tract) infections: Secondary | ICD-10-CM | POA: Diagnosis not present

## 2017-06-21 DIAGNOSIS — M8580 Other specified disorders of bone density and structure, unspecified site: Secondary | ICD-10-CM

## 2017-06-21 DIAGNOSIS — C7802 Secondary malignant neoplasm of left lung: Secondary | ICD-10-CM

## 2017-06-21 DIAGNOSIS — Z170 Estrogen receptor positive status [ER+]: Secondary | ICD-10-CM | POA: Diagnosis not present

## 2017-06-21 DIAGNOSIS — I2510 Atherosclerotic heart disease of native coronary artery without angina pectoris: Secondary | ICD-10-CM

## 2017-06-21 DIAGNOSIS — C50112 Malignant neoplasm of central portion of left female breast: Secondary | ICD-10-CM

## 2017-06-21 DIAGNOSIS — C7989 Secondary malignant neoplasm of other specified sites: Secondary | ICD-10-CM

## 2017-06-21 DIAGNOSIS — I10 Essential (primary) hypertension: Secondary | ICD-10-CM | POA: Diagnosis not present

## 2017-06-21 DIAGNOSIS — C44501 Unspecified malignant neoplasm of skin of breast: Secondary | ICD-10-CM

## 2017-06-21 DIAGNOSIS — Z5111 Encounter for antineoplastic chemotherapy: Secondary | ICD-10-CM

## 2017-06-21 MED ORDER — FULVESTRANT 250 MG/5ML IM SOLN
500.0000 mg | INTRAMUSCULAR | Status: DC
  Administered 2017-06-21: 500 mg via INTRAMUSCULAR
  Filled 2017-06-21: qty 10

## 2017-06-21 MED ORDER — FLUCONAZOLE 100 MG PO TABS: 100.0000 mg | ORAL_TABLET | Freq: Every day | ORAL | 0 refills | Status: DC

## 2017-06-21 NOTE — Telephone Encounter (Signed)
Gave patient avs report and appointments for September and October  °

## 2017-06-21 NOTE — Telephone Encounter (Signed)
Spoke with patient regarding the addition of her appts in September.   Sending her a reminder letter.

## 2017-06-21 NOTE — Progress Notes (Signed)
Gloria [name]  [phone]) [phone] [phone]) [phone]  OFFICE PROGRESS NOTE  PATIENT: Gloria [name]   DOB: [birth year]  MR#: [id number]  PPJ#:[id number]  [id number], [name], MD [name], MD [name]. [name], MD  DIAGNOSIS:  An 83 y.o.  woman with invasive lobular carcinoma of the left breast diagnosed in 07/2010, chest wall local recurrence in 09/2015 .  Oncology History   Cancer of central portion of left female breast [facility])   Staging form: Breast, AJCC 7th Edition     Pathologic stage from 08/04/2010: Stage IIB (T2, N1a, cM0) - Signed by Truitt Merle, MD on 12/09/2015       Cancer of central portion of left female breast ([place])   08/03/2010 Mammogram     left nipple retraction, and the palpable mass subareolar left breast at the 6:00 position. Ultrasound confirmed the presence of a mass measuring 2.6 x 1.7 x 2.4 cm      08/04/2010 Initial Biopsy    left breast mass biopsy showed an invasive mammary carcinoma with lobular features. ER 95%, PR 97%, Ki-67 17%, HER-2/neu (-)      08/04/2010 Pathologic Stage    Stage IIB: T2 N1a      08/11/2010 Imaging    left breast mass 4.6 x 4.2 x 2.3 cm with enhancement distortion extending to the nipple retraction. There is questionable anterior mediastinal lymph node seen      07/2010 - 09/03/2010 Anti-estrogen oral therapy    neoadjuvant letrozole       09/03/2010 Surgery     left breast mastectomy with sentinel node biopsy on 09/03/2010.      08/2010 - 01/2015 Anti-estrogen oral therapy    Tamoxifen, pt stopped on her own       12/03/2010 - 01/23/2011 Radiation Therapy    left chest wall and axilla radiaiton       10/19/2015 Progression    Left chest wall recurrence, s/p resection on 11/25/2015 with positive margins ([facility])       12/10/2015 -  Anti-estrogen oral therapy    anastrozole 45m daily, which was switched to Tamoxifen on 06/30/2016 due to persistent disease.      01/03/2016 - 03/03/2016 Radiation Therapy     Radiation to left chest wall recurrence (12/06/2015-02/04/2016 - 44 Gy), pt progressed through the radiation, and had additional 10 fractions of radiation (02/21/16-03/03/2016 - total 70 Gy to the left chest wall)      07/14/2016 PET scan    PET 07/14/2016 IMPRESSION: 1. No findings of recurrent malignancy. 2. Radiation fibrosis in the lingula. A small left axillary lymph node is not hypermetabolic and only 6 mm in diameter. This is in the vicinity of the prior axillary cyst. 3. Pelvic floor laxity.      07/17/2016 Surgery    Chest wall soft tissue resection for local recurrence      07/17/2016 Pathology Results    Left chest wall two soft tissue resection both showed invasive lobular carcinoma, margins were positive. ER 95% positive, PR 60% positive, HER-2 negative.      07/27/2016 Imaging    CT chest, abdomen and pelvis wo contrast  07/27/2016 IMPRESSION: Sigmoid diverticulosis without acute diverticulitis. No bowel obstruction or acute inflammation. Degenerative disc disease and facet arthropathy of the lower lumbar spine with slight grade 1 anterolisthesis of L4 on L5. Stable appearing simple left-sided renal cysts.      03/01/2017 Mammogram    Mammogram 03/01/2017 IMPRESSION: No mammographic evidence of malignancy. A result  letter of this screening mammogram will be mailed directly to the patient. RECOMMENDATION: Screening mammogram in one year      04/10/2017 Imaging    PET IMPRESSION: 1. Stable exam.  No findings of recurrent malignancy. 2. Changes of external beam radiation again noted within the lingula. 3. Small left axillary lymph node exhibits mild increased uptake. Unchanged from previous exam.        CURRENT THERAPY:  Tamoxifen 83m daily changed to Faslodex injection  INTERVAL HISTORY:  She called previously and he requests to be seen earlier, due to her concern of the change on the left chest wall. She presents to the clinic today with her friend. Pt reports  sharp pain to her left chest wall. Pt has had a prior left breast mastectomy. She notes swelling to the left chest wall. Pt also reports being constipated. Her sx have improved since taking antibiotics. She states that she has had three UTI's since 04/04/17 that were treated with abx therapy. [facility] and had a UA culture result in E. coli. Pt notes that she is on day 3 of her 3rd round of abx and she will complete them in 4 days.   On review of systems, pt denies fever, chills, weight loss, decreased appetite, decreased energy levels. Pt denies abdominal pain, nausea, vomiting, blood in her stool. Pt reports constipations. Pt reports left chest wall pain and chest wall swelling.   Today, [facility] for follow-up. She has been doing well following the injection. She is slightly confused on why she had to switch from tamoxifen. Her left side pain is tolerable and only bothers her when she sleeps on that side or raises her arm. She has been having some yeast infection like symptoms. She reports to having some constipation occasionally.   PAST MEDICAL HISTORY: Past Medical History:  Diagnosis Date  . Anxiety   . Blood in urine   . CAD (coronary artery disease)    non obstructive by cath  . Cancer ([facility]    breast- left  . Depression   . GERD (gastroesophageal reflux disease)   . Hypertension   . Knee fracture   . Personal history of radiation therapy 2017  . TR (tricuspid regurgitation)    mild by Echo 12/2008 EF >55%    PAST SURGICAL HISTORY: Past Surgical History:  Procedure Laterality Date  . ABDOMINAL AORTAGRAM N/A 09/08/2011   Procedure: ABDOMINAL AMaxcine Ham  Surgeon: TTroy Sine MD;  [facility];  Service: Cardiovascular;  Laterality: N/A;  . ABDOMINAL HYSTERECTOMY    . BREAST EXCISIONAL BIOPSY Left 2017   X3  . BREAST SURGERY  08-30-10   mastectomy  .  CARDIAC CATHETERIZATION  08/2011   20% LAD stenosis, 20% diagonal stenosis, 10-20% proximal dominant RCA stenosis  . COLON SURGERY    . LEFT HEART CATHETERIZATION WITH CORONARY ANGIOGRAM N/A 09/08/2011   Procedure: LEFT HEART CATHETERIZATION WITH CORONARY ANGIOGRAM;  Surgeon: TTroy Sine MD;  [facility];  Service: Cardiovascular;  Laterality: N/A;  . MASTECTOMY    . MINOR BREAST BIOPSY Left 11/25/2015   Procedure: MINOR EXCISION MASS LEFT CHEST WALL;  Surgeon: PAutumn MessingIII, MD;  [facility]  Service: General;  Laterality: Left;  . MINOR BREAST BIOPSY Left 07/17/2016   Procedure: EXCISION OF 2 LEFT CHEST WALL MASSES;  Surgeon: PAutumn MessingIII, MD;  Location: [place];  Service: General;  Laterality: Left;  . TONSILLECTOMY      FAMILY HISTORY: Family History  Problem Relation Age of Onset  . Cancer Father        prostate  . Cancer Sister        breast  . Heart disease Sister   . Breast cancer Sister   . Cancer Brother        pancreatic  . Cancer Maternal Grandmother        colon  . Dementia Mother   . Sudden death Brother   . Heart attack Brother   . Heart attack Brother   . Arthritis Sister   . Breast cancer Maternal Aunt     SOCIAL HISTORY: Social History  Substance Use Topics  . Smoking status: Former Smoker    Quit date: 11/15/1991  . Smokeless tobacco: Never Used  . Alcohol use 1.2 oz/week    2 Glasses of wine per week     Comment: 1 glass per week    ALLERGIES: Allergies  Allergen Reactions  . Contrast Media [Iodinated Diagnostic Agents] Anaphylaxis  . Penicillins Anaphylaxis and Swelling    Has patient had a PCN reaction causing immediate rash, facial/tongue/throat swelling, SOB or lightheadedness with hypotension: Yes Has patient had a PCN reaction causing severe rash involving mucus membranes or skin necrosis: No Has patient had a PCN reaction that required hospitalization Yes Has patient had a PCN  reaction occurring within the last 10 years: No If all of the above answers are "NO", then may proceed with Cephalosporin use.   . Adhesive [Tape] Other (See Comments)    blisters  . Cephalexin Swelling  . Ciprofloxacin Swelling  . Demerol Nausea And Vomiting  . Sulfamethoxazole-Trimethoprim Itching and Swelling    REACTION: swelling/hives  . Quinolones Itching and Rash    REACTION: itching, rash Pt. Reports no problems with levaquin     MEDICATIONS:  Current Outpatient Prescriptions  Medication Sig Dispense Refill  . acetaminophen (TYLENOL) 500 MG tablet Take 1,000 mg by mouth daily as needed for moderate pain. Reported on 03/10/2016    . ALPRAZolam (XANAX) 0.25 MG tablet Take 1/2 tablet in am and 1 tablet prior to bedtime. 45 tablet 2  . aspirin 81 MG chewable tablet Chew 81 mg by mouth once a week. Reported on 03/10/2016    . CALCIUM-MAGNESIUM-VITAMIN D PO Take 1 tablet by mouth 2 (two) times daily.    . cetirizine (ZYRTEC) 10 MG tablet Take 10 mg by mouth as needed for allergies.     Marland Kitchen doxepin (SINEQUAN) 10 MG capsule Take 10 mg by mouth at bedtime.     . fluconazole (DIFLUCAN) 100 MG tablet Take 1 tablet (100 mg total) by mouth daily. 1 tablet 0  . fluticasone (FLONASE) 50 MCG/ACT nasal spray Place 1 spray into both nostrils daily as needed for allergies.     Marland Kitchen guaiFENesin (MUCINEX) 600 MG 12 hr tablet Take 600 mg by mouth daily as needed for cough or to loosen phlegm. Reported on 03/28/2016    . ipratropium (ATROVENT) 0.06 % nasal spray Place 2 sprays into both nostrils 4 (four) times daily. 15 mL 0  . losartan (COZAAR) 25 MG tablet Take 25 mg by mouth daily.   0  . nitroGLYCERIN (NITROSTAT) 0.4 MG SL tablet Place 1 tablet (0.4 mg total) under the tongue every 5 (five) minutes as needed for chest pain. <PLEASE MAKE APPOINTMENT> 25 tablet 3  . potassium  chloride (K-DUR) 10 MEQ tablet Take 10 mEq by mouth daily.    . pseudoephedrine-acetaminophen (TYLENOL SINUS) 30-500 MG TABS tablet  Take 1 tablet by mouth every 4 (four) hours as needed.    . triamcinolone (KENALOG) 0.025 % cream Apply 1 application topically 2 (two) times daily. 30 g 0   No current facility-administered medications for this visit.       REVIEW OF SYSTEMS:  A 10 point review of systems was completed and is negative except as noted above.    PHYSICAL EXAMINATION:  BP 133/69 (BP Location: Left Arm, Patient Position: Sitting)   Pulse 73   Temp 97.6 F (36.4 C) (Oral)   Resp 20   Ht _0  (1.549 m)   Wt 127 lb 11.2 oz (57.9 kg)   SpO2 99%   BMI 24.13 kg/m    GENERAL: Patient is a well appearing female in no acute distress HEENT:  Sclerae anicteric.  Oropharynx clear and moist. No ulcerations or evidence of oropharyngeal candidiasis. Neck is supple.  NODES:   There is no cervical , supraclavicular, or axillary lymphadenopathy palpated.  LUNGS:  Clear to auscultation bilaterally.  No wheezes or rhonchi. HEART:  Regular rate and rhythm. No murmur appreciated.  Chest:  ABDOMEN:  Soft, nontender.  Positive, normoactive bowel sounds. No organomegaly palpated.  MSK:  No focal spinal tenderness to palpation. Full range of motion bilaterally in the upper extremities. EXTREMITIES:  No peripheral edema.   SKIN:  Clear with no obvious rashes or skin changes.  NEURO:  Nonfocal. Well oriented.  Appropriate affect. BREAST EXAM:  Left breast is surgically absent,  incision in the left frontal chest has healed well.  Mild soft tissue fullness and edema around the incision, with skin retraction. Multiple 5-15 mm nodules along the incision line, slightly larger than before. Soft tissues are slightly more tender than previous visits. Tissues swollen around incision line. Right breast and bilateral axilla notable for no masses or nodules.  RECTUM: external and internal hemorrhoids, no palpable abnormalities. Non-tender.   Nodule on left side?  ECOG FS:  Grade 1 - Symptomatic but completely ambulatory   LAB  RESULTS: CBC Latest Ref Rng & Units 05/31/2017 03/21/2017 01/23/2017  WBC 3.9 - 10.3 10e3/uL 4.5 4.5 2.2(L)  Hemoglobin 11.6 - 15.9 g/dL 13.1 12.8 12.5  Hematocrit 34.8 - 46.6 % 39.5 37.9 37.5  Platelets 145 - 400 10e3/uL 122(L) 117(L) 160      CMP Latest Ref Rng & Units 05/31/2017 03/21/2017 01/23/2017  Glucose 70 - 140 mg/dl 91 79 104  BUN 7.0 - 26.0 mg/dL 19.1 17.9 15.8  Creatinine 0.6 - 1.1 mg/dL 1.0 0.8 0.9  Sodium 136 - 145 mEq/L 141 142 143  Potassium 3.5 - 5.1 mEq/L 4.4 4.0 4.0  Chloride 101 - 111 mmol/L - - -  CO2 22 - 29 mEq/L _1 Calcium 8.4 - 10.4 mg/dL 9.2 9.2 9.0  Total Protein 6.4 - 8.3 g/dL 6.3(L) 5.8(L) 5.8(L)  Total Bilirubin 0.20 - 1.20 mg/dL 0.53 0.49 0.39  Alkaline Phos 40 - 150 U/L 46 56 49  AST 5 - 34 U/L _2 ALT 0 - 55 U/L _3 CA antigen 27.29 on 03/21/17 was 32.7  PATHOLOGY REPORT Diagnosis 11/25/2015 Soft tissue mass, simple excision, Left chest wall - INVASIVE LOBULAR CARCINOMA, PRESENT AT NON ORIENTED TISSUE EDGE(S). - SEE COMMENT. Microscopic Comment The carcinoma appears grade 2. A breast prognostic profile will be performed and  the results reported separately. (JDP:gt, 11/26/15).  Results: IMMUNOHISTOCHEMICAL AND MORPHOMETRIC ANALYSIS PERFORMED MANUALLY Estrogen Receptor: 95%, POSITIVE, STRONG STAINING INTENSITY Progesterone Receptor: 60%, POSITIVE, STRONG STAINING INTENSITY Results: HER2 - NEGATIVE RATIO OF HER2/CEP17 SIGNALS 1.07 AVERAGE HER2 COPY NUMBER PER CELL 2.90  Diagnosis 07/17/2016 1. Soft tissue mass, simple excision, Chest wall medial left - INVASIVE LOBULAR CARCINOMA, SEE COMMENT. - NODULE OF FIBROSIS AND CALCIFICATION. 2. Soft tissue mass, simple excision, Chest wall lateral left - INVASIVE LOBULAR CARCINOMA, SEE COMMENT. Microscopic Comment 1. -2. Both parts reveal foci of invasive lobular carcinoma. There is only limited tumor in part #1. Tumor does extend to the uninked edges of both parts. The carcinoma appears  grade 2. A prognostic panel has been ordered on part #2.  2. PROGNOSTIC INDICATORS Results: IMMUNOHISTOCHEMICAL AND MORPHOMETRIC ANALYSIS PERFORMED MANUALLY Estrogen Receptor: 60%, POSITIVE, STRONG STAINING INTENSITY Progesterone Receptor: 90%, POSITIVE, STRONG STAINING INTENSITY Proliferation Marker Ki67: 10%  Results: HER2 - NEGATIVE RATIO OF HER2/CEP17 SIGNALS 1.20 AVERAGE HER2 COPY NUMBER PER CELL 2.40   RADIOGRAPHIC STUDIES:  PET 04/10/17 IMPRESSION: 1. Stable exam.  No findings of recurrent malignancy. 2. Changes of external beam radiation again noted within the lingula. 3. Small left axillary lymph node exhibits mild increased uptake. Unchanged from previous exam.   Mammogram 03/01/2017 IMPRESSION: No mammographic evidence of malignancy. A result letter of this screening mammogram will be mailed directly to the patient. RECOMMENDATION: Screening mammogram in one year   CT chest, abdomen and pelvis wo contrast  07/27/2016 IMPRESSION: Sigmoid diverticulosis without acute diverticulitis. No bowel obstruction or acute inflammation. Degenerative disc disease and facet arthropathy of the lower lumbar spine with slight grade 1 anterolisthesis of L4 on L5. Stable appearing simple left-sided renal cysts.  PET 07/14/2016 IMPRESSION: 1. No findings of recurrent malignancy. 2. Radiation fibrosis in the lingula. A small left axillary lymph node is not hypermetabolic and only 6 mm in diameter. This is in the vicinity of the prior axillary cyst. 3. Pelvic floor laxity.   ASSESSMENT/PLAN: 83 y.o. female with: invasive lobular carcinoma of the left breast   1. Cancer of the central portion of left female breast, Stage IIB, T2 N1 invasive lobular carcinoma, grade 2, ER 95%, PR 97%, Ki-67 17%, HER-2/neu no amplification, diagnosed in 07/2010, local chest wall recurrence in 09/2015   -She was on adjuvant tamoxifen for 4.5 years, stopped on her own. -She unfortunately developed  local recurrence 8 months after she stopped tamoxifen. -I previously reviewed her surgical pathology findings, which is consistent with lobular carcinoma recurrence from her previous breast cancer. -The tumor was surgically removed, with positive margins. -Her restaging CT scan and bone scan in 12/2015 was negative for distant metastasis. -She has completed chest wall radiation, however developed 2-3 small nodules during the radiation and anastrozole therapy, supicious for residual cancer. US showed a 4 mm and 6 mm subcutaneous nodule, partially calcified, highly suspicious for local recurrence. -She subsequently underwent a second chest wall surgery for two soft tissue resection, I previously reviewed her pathology results, both showed invasive lobular carcinoma, margins were positive. I spoke with Dr. Marlou Starks, no more surgery will be offered, because complete surgical resection is unlikely. Patient is also not in favor of more extensive surgery. - We previously discussed her that her cancer is unlikely curable at this stage, and our goal of therapy is palliative for disease control, and prolong her life. She voiced good understanding. - I previously changed her endocrine therapy to Tamoxifen, she is tolerating very well. -  I reviewed her restaging PET scan from 04/10/2017, which was negative for metastatic disease - She has developed more chest wall tenderness, the tumor nodules along the incision signs are slightly bigger and tender, I think she has had some disease progression. I recommend her to stop tamoxifen, and changed to fulvestrant injection -Pt has begun Faslodex injections and d/c Tamoxifen. We will give it time and see how she tolerates. If injections are not enough alone, I will add on a CDK4/6 inhibitor, she is agreeable   2. HTN, CAD  - She'll continue follow-up with her primary care physician and cardiologist  3. Left shoulder blade pain - Chronic, intermittent, mild, for the past year.  Prior bone scan was negative - Possible muscular related, we'll continue monitoring - near resolved lately    4. Osteoporosis - her last DEXA scan in 06/2012 showed osteoporosis  - I previously encouraged her to continue calcium and Vit D - I previously encouraged her to repeat DEXA at her PCP office   5. Chest Pain - Secondary to the small tumor nodules on the chest wall. - Manageable, she cannot take much pain medication.  6. Recurrent UTI -She has had history episodes of UTI in the past few months, responded well to antibiotics. -She will f/u with urology  -We discussed option of prophylactic antibiotics.    Plan - Faslodex injection today and in 2 weeks, then every 4 weeks after - f/u, lab in 6 weeks   I spent 20 minutes counseling the patient face to face.  The total time spent in the appointment was 30 minutes.  This document serves as a record of services personally performed by Truitt Merle, MD. It was created on her behalf by Brandt Loosen, a trained medical scribe. The creation of this record is based on the scribe's personal observations and the provider's statements to them. This document has been checked and approved by the attending provider.  Truitt Merle  06/21/2017

## 2017-06-22 ENCOUNTER — Encounter: Payer: Self-pay | Admitting: Hematology

## 2017-06-30 ENCOUNTER — Other Ambulatory Visit: Payer: Self-pay | Admitting: Hematology

## 2017-07-04 ENCOUNTER — Other Ambulatory Visit: Payer: Self-pay | Admitting: *Deleted

## 2017-07-04 MED ORDER — ALPRAZOLAM 0.25 MG PO TABS: ORAL_TABLET | ORAL | 2 refills | Status: DC

## 2017-07-05 ENCOUNTER — Ambulatory Visit: Payer: PPO

## 2017-07-05 ENCOUNTER — Ambulatory Visit (HOSPITAL_BASED_OUTPATIENT_CLINIC_OR_DEPARTMENT_OTHER): Payer: PPO

## 2017-07-05 VITALS — BP 121/74 | HR 79 | Temp 97.9°F | Resp 18

## 2017-07-05 DIAGNOSIS — C7802 Secondary malignant neoplasm of left lung: Secondary | ICD-10-CM | POA: Diagnosis not present

## 2017-07-05 DIAGNOSIS — C44501 Unspecified malignant neoplasm of skin of breast: Secondary | ICD-10-CM | POA: Diagnosis not present

## 2017-07-05 DIAGNOSIS — C50112 Malignant neoplasm of central portion of left female breast: Secondary | ICD-10-CM

## 2017-07-05 DIAGNOSIS — Z5111 Encounter for antineoplastic chemotherapy: Secondary | ICD-10-CM

## 2017-07-05 DIAGNOSIS — Z170 Estrogen receptor positive status [ER+]: Principal | ICD-10-CM

## 2017-07-05 MED ORDER — FULVESTRANT 250 MG/5ML IM SOLN
500.0000 mg | INTRAMUSCULAR | Status: DC
Start: 2017-07-05 — End: 2017-07-05
  Administered 2017-07-05: 500 mg via INTRAMUSCULAR
  Filled 2017-07-05: qty 10

## 2017-07-05 NOTE — Patient Instructions (Signed)

## 2017-07-26 ENCOUNTER — Encounter: Payer: Self-pay | Admitting: Hematology

## 2017-07-26 ENCOUNTER — Ambulatory Visit (HOSPITAL_BASED_OUTPATIENT_CLINIC_OR_DEPARTMENT_OTHER): Payer: PPO | Admitting: Hematology

## 2017-07-26 ENCOUNTER — Other Ambulatory Visit (HOSPITAL_BASED_OUTPATIENT_CLINIC_OR_DEPARTMENT_OTHER): Payer: PPO

## 2017-07-26 ENCOUNTER — Ambulatory Visit: Payer: PPO

## 2017-07-26 VITALS — BP 119/66 | HR 82 | Temp 97.8°F | Resp 16 | Ht 61.0 in | Wt 130.4 lb

## 2017-07-26 DIAGNOSIS — Z170 Estrogen receptor positive status [ER+]: Secondary | ICD-10-CM | POA: Diagnosis not present

## 2017-07-26 DIAGNOSIS — C50112 Malignant neoplasm of central portion of left female breast: Secondary | ICD-10-CM

## 2017-07-26 DIAGNOSIS — I10 Essential (primary) hypertension: Secondary | ICD-10-CM | POA: Diagnosis not present

## 2017-07-26 DIAGNOSIS — K5900 Constipation, unspecified: Secondary | ICD-10-CM

## 2017-07-26 DIAGNOSIS — R079 Chest pain, unspecified: Secondary | ICD-10-CM | POA: Diagnosis not present

## 2017-07-26 DIAGNOSIS — C44501 Unspecified malignant neoplasm of skin of breast: Secondary | ICD-10-CM | POA: Diagnosis not present

## 2017-07-26 DIAGNOSIS — R0609 Other forms of dyspnea: Secondary | ICD-10-CM | POA: Diagnosis not present

## 2017-07-26 DIAGNOSIS — M810 Age-related osteoporosis without current pathological fracture: Secondary | ICD-10-CM

## 2017-07-26 DIAGNOSIS — R5383 Other fatigue: Secondary | ICD-10-CM

## 2017-07-26 LAB — COMPREHENSIVE METABOLIC PANEL
ALBUMIN: 3.7 g/dL (ref 3.5–5.0)
ALK PHOS: 64 U/L (ref 40–150)
ALT: 10 U/L (ref 0–55)
AST: 23 U/L (ref 5–34)
Anion Gap: 7 mEq/L (ref 3–11)
BILIRUBIN TOTAL: 0.4 mg/dL (ref 0.20–1.20)
BUN: 20.2 mg/dL (ref 7.0–26.0)
CALCIUM: 9.4 mg/dL (ref 8.4–10.4)
CO2: 28 mEq/L (ref 22–29)
CREATININE: 0.9 mg/dL (ref 0.6–1.1)
Chloride: 108 mEq/L (ref 98–109)
EGFR: 59 mL/min/{1.73_m2} — ABNORMAL LOW (ref >90)
Glucose: 97 mg/dl (ref 70–140)
Potassium: 4.1 mEq/L (ref 3.5–5.1)
Sodium: 143 mEq/L (ref 136–145)
Total Protein: 6.4 g/dL (ref 6.4–8.3)

## 2017-07-26 LAB — CBC WITH DIFFERENTIAL/PLATELET
BASO%: 1.3 % (ref 0.0–2.0)
BASOS ABS: 0 10*3/uL (ref 0.0–0.1)
EOS%: 6 % (ref 0.0–7.0)
Eosinophils Absolute: 0.2 10*3/uL (ref 0.0–0.5)
HEMATOCRIT: 37.8 % (ref 34.8–46.6)
HEMOGLOBIN: 12.5 g/dL (ref 11.6–15.9)
LYMPH#: 0.7 10*3/uL — AB (ref 0.9–3.3)
LYMPH%: 18.9 % (ref 14.0–49.7)
MCH: 30.8 pg (ref 25.1–34.0)
MCHC: 33.1 g/dL (ref 31.5–36.0)
MCV: 93 fL (ref 79.5–101.0)
MONO#: 0.3 10*3/uL (ref 0.1–0.9)
MONO%: 7.5 % (ref 0.0–14.0)
NEUT#: 2.6 10*3/uL (ref 1.5–6.5)
NEUT%: 66.3 % (ref 38.4–76.8)
PLATELETS: 129 10*3/uL — AB (ref 145–400)
RBC: 4.07 10*6/uL (ref 3.70–5.45)
RDW: 13.1 % (ref 11.2–14.5)
WBC: 3.9 10*3/uL (ref 3.9–10.3)

## 2017-07-26 NOTE — Progress Notes (Addendum)
[place]  [phone] [phone]  OFFICE PROGRESS NOTE  PATIENT: Gloria [name]   DOB: 1928/05/29  MR#: [id number]  STM#:[id number]  [id number], [name], MD [name], MD [name]. [name], MD  DIAGNOSIS:  An 83 y.o. Gloria [name] woman with invasive lobular carcinoma of the left breast diagnosed in 07/2010, chest wall local recurrence in 09/2015 .  Oncology History   Cancer of central portion of left female breast [facility])   Staging form: Breast, AJCC 7th Edition     Pathologic stage from 08/04/2010: Stage IIB (T2, N1a, cM0) - Signed by Truitt Merle, MD on 12/09/2015       Cancer of central portion of left female breast ([place])   08/03/2010 Mammogram     left nipple retraction, and the palpable mass subareolar left breast at the 6:00 position. Ultrasound confirmed the presence of a mass measuring 2.6 x 1.7 x 2.4 cm      08/04/2010 Initial Biopsy    left breast mass biopsy showed an invasive mammary carcinoma with lobular features. ER 95%, PR 97%, Ki-67 17%, HER-2/neu (-)      08/04/2010 Pathologic Stage    Stage IIB: T2 N1a      08/11/2010 Imaging    left breast mass 4.6 x 4.2 x 2.3 cm with enhancement distortion extending to the nipple retraction. There is questionable anterior mediastinal lymph node seen      07/2010 - 09/03/2010 Anti-estrogen oral therapy    neoadjuvant letrozole       09/03/2010 Surgery     left breast mastectomy with sentinel node biopsy on 09/03/2010.      08/2010 - 01/2015 Anti-estrogen oral therapy    Tamoxifen, pt stopped on her own       12/03/2010 - 01/23/2011 Radiation Therapy    left chest wall and axilla radiaiton       10/19/2015 Progression    Left chest wall recurrence, s/p resection on 11/25/2015 with positive margins ([facility])       12/10/2015 - 05/31/2017 Anti-estrogen oral therapy    anastrozole 89m daily, which was switched to Tamoxifen on 06/30/2016 due to persistent disease.      01/03/2016 - 03/03/2016 Radiation  Therapy    Radiation to left chest wall recurrence (12/06/2015-02/04/2016 - 44 Gy), pt progressed through the radiation, and had additional 10 fractions of radiation (02/21/16-03/03/2016 - total 70 Gy to the left chest wall)      07/14/2016 PET scan    PET 07/14/2016 IMPRESSION: 1. No findings of recurrent malignancy. 2. Radiation fibrosis in the lingula. A small left axillary lymph node is not hypermetabolic and only 6 mm in diameter. This is in the vicinity of the prior axillary cyst. 3. Pelvic floor laxity.      07/17/2016 Surgery    Chest wall soft tissue resection for local recurrence      07/17/2016 Pathology Results    Left chest wall two soft tissue resection both showed invasive lobular carcinoma, margins were positive. ER 95% positive, PR 60% positive, HER-2 negative.      07/27/2016 Imaging    CT chest, abdomen and pelvis wo contrast  07/27/2016 IMPRESSION: Sigmoid diverticulosis without acute diverticulitis. No bowel obstruction or acute inflammation. Degenerative disc disease and facet arthropathy of the lower lumbar spine with slight grade 1 anterolisthesis of L4 on L5. Stable appearing simple left-sided renal cysts.      03/01/2017 Mammogram    Mammogram 03/01/2017 IMPRESSION: No mammographic evidence of malignancy. A result  letter of this screening mammogram will be mailed directly to the patient. RECOMMENDATION: Screening mammogram in one year      04/10/2017 Imaging    PET IMPRESSION: 1. Stable exam.  No findings of recurrent malignancy. 2. Changes of external beam radiation again noted within the lingula. 3. Small left axillary lymph node exhibits mild increased uptake. Unchanged from previous exam.      06/07/2017 Miscellaneous    Began Faslodex IM injection after switched from tamoxifen       PREVIOUS THERAPY: Tamoxifen 11m daily  CURRENT THERAPY: Faslodex injection 500 mg on days 1, 15, and 29 then maintenance 500 mg monthly  INTERVAL HISTORY:  [name] presents today for follow-up as scheduled, she is here with a friend. She was last seen on 06/21/2017. She was previously switched from tamoxifen and began Faslodex injections on 06/07/2017, she has had 3 injections and is tolerating it well. Since her last visit, she notes increased moderate left breast and chest wall pain that is worse at night. She has noticed more retraction or "drawing tighter" around her ribs. She takes Tylenol and this helps. She is mildly fatigued over baseline with dyspnea on exertion, appetite is normal. She continues to do her household daily activities.   On review of systems, pt denies fever, chills, weight loss, decreased appetite. Pt denies abdominal pain, nausea, vomiting, blood in her stool. Pt reports mild constipation. Pt reports left chest wall pain and chest wall swelling increased from baseline.   PAST MEDICAL HISTORY: Past Medical History:  Diagnosis Date  . Anxiety   . Blood in urine   . CAD (coronary artery disease)    non obstructive by cath  . Cancer ([facility]    breast- left  . Depression   . GERD (gastroesophageal reflux disease)   . Hypertension   . Knee fracture   . Personal history of radiation therapy 2017  . TR (tricuspid regurgitation)    mild by Echo 12/2008 EF >55%    PAST SURGICAL HISTORY: Past Surgical History:  Procedure Laterality Date  . ABDOMINAL AORTAGRAM N/A 09/08/2011   Procedure: ABDOMINAL AMaxcine Ham  Surgeon: TTroy Sine MD;  [facility];  Service: Cardiovascular;  Laterality: N/A;  . ABDOMINAL HYSTERECTOMY    . BREAST EXCISIONAL BIOPSY Left 2017   X3  . BREAST SURGERY  08-30-10   mastectomy  . CARDIAC CATHETERIZATION  08/2011   20% LAD stenosis, 20% diagonal stenosis, 10-20% proximal dominant RCA stenosis  . COLON SURGERY    . LEFT HEART CATHETERIZATION WITH CORONARY ANGIOGRAM N/A 09/08/2011   Procedure: LEFT HEART CATHETERIZATION WITH CORONARY ANGIOGRAM;  Surgeon: TTroy Sine MD;  [facility];   Service: Cardiovascular;  Laterality: N/A;  . MASTECTOMY    . MINOR BREAST BIOPSY Left 11/25/2015   Procedure: MINOR EXCISION MASS LEFT CHEST WALL;  Surgeon: PAutumn MessingIII, MD;  [place]  Service: General;  Laterality: Left;  . MINOR BREAST BIOPSY Left 07/17/2016   Procedure: EXCISION OF 2 LEFT CHEST WALL MASSES;  Surgeon: PAutumn MessingIII, MD;  [place] [facility]  Service: General;  Laterality: Left;  . TONSILLECTOMY      FAMILY HISTORY: Family History  Problem Relation Age of Onset  . Cancer Father        prostate  . Cancer Sister        breast  . Heart disease Sister   . Breast cancer Sister   . Cancer  Brother        pancreatic  . Cancer Maternal Grandmother        colon  . Dementia Mother   . Sudden death Brother   . Heart attack Brother   . Heart attack Brother   . Arthritis Sister   . Breast cancer Maternal Aunt     SOCIAL HISTORY: Social History  Substance Use Topics  . Smoking status: Former Smoker    Quit date: 11/15/1991  . Smokeless tobacco: Never Used  . Alcohol use 1.2 oz/week    2 Glasses of wine per week     Comment: 1 glass per week    ALLERGIES: Allergies  Allergen Reactions  . Contrast Media [Iodinated Diagnostic Agents] Anaphylaxis  . Penicillins Anaphylaxis and Swelling    Has patient had a PCN reaction causing immediate rash, facial/tongue/throat swelling, SOB or lightheadedness with hypotension: Yes Has patient had a PCN reaction causing severe rash involving mucus membranes or skin necrosis: No Has patient had a PCN reaction that required hospitalization Yes Has patient had a PCN reaction occurring within the last 10 years: No If all of the above answers are "NO", then may proceed with Cephalosporin use.   . Adhesive [Tape] Other (See Comments)    blisters  . Cephalexin Swelling  . Ciprofloxacin Swelling  . Demerol Nausea And Vomiting  . Sulfamethoxazole-Trimethoprim Itching and Swelling     REACTION: swelling/hives  . Quinolones Itching and Rash    REACTION: itching, rash Pt. Reports no problems with levaquin     MEDICATIONS:  Current Outpatient Prescriptions  Medication Sig Dispense Refill  . acetaminophen (TYLENOL) 500 MG tablet Take 1,000 mg by mouth daily as needed for moderate pain. Reported on 03/10/2016    . ALPRAZolam (XANAX) 0.25 MG tablet Take 1/2 tablet in am and 1 tablet prior to bedtime. 45 tablet 2  . aspirin 81 MG chewable tablet Chew 81 mg by mouth once a week. Reported on 03/10/2016    . CALCIUM-MAGNESIUM-VITAMIN D PO Take 1 tablet by mouth 2 (two) times daily.    . cetirizine (ZYRTEC) 10 MG tablet Take 10 mg by mouth as needed for allergies.     Marland Kitchen doxepin (SINEQUAN) 10 MG capsule Take 10 mg by mouth at bedtime.     . fluconazole (DIFLUCAN) 100 MG tablet Take 1 tablet (100 mg total) by mouth daily. 1 tablet 0  . fluticasone (FLONASE) 50 MCG/ACT nasal spray Place 1 spray into both nostrils daily as needed for allergies.     Marland Kitchen guaiFENesin (MUCINEX) 600 MG 12 hr tablet Take 600 mg by mouth daily as needed for cough or to loosen phlegm. Reported on 03/28/2016    . ipratropium (ATROVENT) 0.06 % nasal spray Place 2 sprays into both nostrils 4 (four) times daily. 15 mL 0  . losartan (COZAAR) 25 MG tablet Take 25 mg by mouth daily.   0  . potassium chloride (K-DUR) 10 MEQ tablet Take 10 mEq by mouth as needed.     . pseudoephedrine-acetaminophen (TYLENOL SINUS) 30-500 MG TABS tablet Take 1 tablet by mouth every 4 (four) hours as needed.    . triamcinolone (KENALOG) 0.025 % cream Apply 1 application topically 2 (two) times daily. 30 g 0  . nitroGLYCERIN (NITROSTAT) 0.4 MG SL tablet Place 1 tablet (0.4 mg total) under the tongue every 5 (five) minutes as needed for chest pain. <PLEASE MAKE APPOINTMENT> (Patient not taking: Reported on 07/26/2017) 25 tablet 3  . tamoxifen (NOLVADEX) 20 MG tablet  Take 20 mg by mouth daily.  0   No current facility-administered medications  for this visit.     REVIEW OF SYSTEMS:  A 10 point review of systems was completed and is negative except as noted above.   PHYSICAL EXAMINATION:  BP 119/66 (BP Location: Right Arm, Patient Position: Sitting)   Pulse 82   Temp 97.8 F (36.6 C) (Oral)   Resp 16   Ht 5' 1"  (1.549 m)   Wt 130 lb 6.4 oz (59.1 kg)   SpO2 98%   BMI 24.64 kg/m    GENERAL: Patient is a well appearing female in no acute distress HEENT:  Sclerae anicteric.  Oropharynx clear and moist. No ulcerations or evidence of oropharyngeal candidiasis. Neck is supple.  NODES:   There is no cervical , supraclavicular, or axillary lymphadenopathy palpated.  LUNGS:  Clear to auscultation bilaterally.  No wheezes or rhonchi. HEART:  Regular rate and rhythm. No murmur appreciated.  Chest:  ABDOMEN:  Soft, nontender.  Positive, normoactive bowel sounds. No organomegaly palpated.  MSK:  No focal spinal tenderness to palpation. Full range of motion in right upper extremity; limited range of motion to left upper extremity secondary to left breast and chest wall pain.  EXTREMITIES:  No peripheral edema.   SKIN:  Clear with no obvious rashes or skin changes. Multiple skin tags present  NEURO:  Nonfocal. Well oriented.  Appropriate affect. BREAST EXAM:  Left breast is surgically absent, incision in the left frontal chest has healed well.  Mild soft tissue fullness and edema around the incision, with skin retraction. Multiple 5-15 mm nodules along the incision line. Soft tissues are tender. Tissues swollen around incision line. Right breast and bilateral axilla notable for no masses or nodules.   ECOG FS:  Grade 1 - Symptomatic but completely ambulatory  LAB RESULTS: CBC Latest Ref Rng & Units 07/26/2017 05/31/2017 03/21/2017  WBC 3.9 - 10.3 10e3/uL 3.9 4.5 4.5  Hemoglobin 11.6 - 15.9 g/dL 12.5 13.1 12.8  Hematocrit 34.8 - 46.6 % 37.8 39.5 37.9  Platelets 145 - 400 10e3/uL 129(L) 122(L) 117(L)     CMP Latest Ref Rng & Units  07/26/2017 05/31/2017 03/21/2017  Glucose 70 - 140 mg/dl 97 91 79  BUN 7.0 - 26.0 mg/dL 20.2 19.1 17.9  Creatinine 0.6 - 1.1 mg/dL 0.9 1.0 0.8  Sodium 136 - 145 mEq/L 143 141 142  Potassium 3.5 - 5.1 mEq/L 4.1 4.4 4.0  Chloride 101 - 111 mmol/L - - -  CO2 22 - 29 mEq/L 28 29 26   Calcium 8.4 - 10.4 mg/dL 9.4 9.2 9.2  Total Protein 6.4 - 8.3 g/dL 6.4 6.3(L) 5.8(L)  Total Bilirubin 0.20 - 1.20 mg/dL 0.40 0.53 0.49  Alkaline Phos 40 - 150 U/L 64 46 56  AST 5 - 34 U/L 23 19 20   ALT 0 - 55 U/L 10 8 10    CA antigen 27.29 on 03/21/17: 32.7  CA antigen 27.29 on 05/31/17: 33.2 CA antigen 27-29 on 07/26/2017 is pending  PATHOLOGY REPORT Diagnosis 11/25/2015 Soft tissue mass, simple excision, Left chest wall - INVASIVE LOBULAR CARCINOMA, PRESENT AT NON ORIENTED TISSUE EDGE(S). - SEE COMMENT. Microscopic Comment The carcinoma appears grade 2. A breast prognostic profile will be performed and the results reported separately. (JDP:gt, 11/26/15).  Results: IMMUNOHISTOCHEMICAL AND MORPHOMETRIC ANALYSIS PERFORMED MANUALLY Estrogen Receptor: 95%, POSITIVE, STRONG STAINING INTENSITY Progesterone Receptor: 60%, POSITIVE, STRONG STAINING INTENSITY Results: HER2 - NEGATIVE RATIO OF HER2/CEP17 SIGNALS 1.07 AVERAGE HER2 COPY NUMBER PER  CELL 2.90  Diagnosis 07/17/2016 1. Soft tissue mass, simple excision, Chest wall medial left - INVASIVE LOBULAR CARCINOMA, SEE COMMENT. - NODULE OF FIBROSIS AND CALCIFICATION. 2. Soft tissue mass, simple excision, Chest wall lateral left - INVASIVE LOBULAR CARCINOMA, SEE COMMENT. Microscopic Comment 1. -2. Both parts reveal foci of invasive lobular carcinoma. There is only limited tumor in part #1. Tumor does extend to the uninked edges of both parts. The carcinoma appears grade 2. A prognostic panel has been ordered on part #2.  2. PROGNOSTIC INDICATORS Results: IMMUNOHISTOCHEMICAL AND MORPHOMETRIC ANALYSIS PERFORMED MANUALLY Estrogen Receptor: 60%, POSITIVE, STRONG  STAINING INTENSITY Progesterone Receptor: 90%, POSITIVE, STRONG STAINING INTENSITY Proliferation Marker Ki67: 10%  Results: HER2 - NEGATIVE RATIO OF HER2/CEP17 SIGNALS 1.20 AVERAGE HER2 COPY NUMBER PER CELL 2.40   RADIOGRAPHIC STUDIES:  PET 04/10/17 IMPRESSION: 1. Stable exam.  No findings of recurrent malignancy. 2. Changes of external beam radiation again noted within the lingula. 3. Small left axillary lymph node exhibits mild increased uptake. Unchanged from previous exam.   Mammogram 03/01/2017 IMPRESSION: No mammographic evidence of malignancy. A result letter of this screening mammogram will be mailed directly to the patient. RECOMMENDATION: Screening mammogram in one year   CT chest, abdomen and pelvis wo contrast  07/27/2016 IMPRESSION: Sigmoid diverticulosis without acute diverticulitis. No bowel obstruction or acute inflammation. Degenerative disc disease and facet arthropathy of the lower lumbar spine with slight grade 1 anterolisthesis of L4 on L5. Stable appearing simple left-sided renal cysts.  PET 07/14/2016 IMPRESSION: 1. No findings of recurrent malignancy. 2. Radiation fibrosis in the lingula. A small left axillary lymph node is not hypermetabolic and only 6 mm in diameter. This is in the vicinity of the prior axillary cyst. 3. Pelvic floor laxity.   ASSESSMENT/PLAN: 83 y.o. female with: invasive lobular carcinoma of the left breast   1. Cancer of the central portion of left female breast, Stage IIB, T2 N1 invasive lobular carcinoma, grade 2, ER 95%, PR 97%, Ki-67 17%, HER-2/neu no amplification, diagnosed in 07/2010, local chest wall recurrence in 09/2015   -Chest wall recurrence in 09/2015 s/p resection on 11/25/2015 with positive margins -the patient was started on anastrazole 1 mg daily, but switched to tamoxifen on 06/30/16 due to persistent disease -she underwent left chest wall radiation at recurrence site from 01/03/16 to 03/03/16 -She completed  chest wall radiation, however developed 2-3 small nodules during the radiation and anastrozole therapy, supicious for residual cancer. US showed a 4 mm and 6 mm subcutaneous nodule, partially calcified, highly suspicious for local recurrence. -restaging PET on 07/14/16 showed no findings of recurrent malignancy -On 07/17/16 she underwent chest wall soft tissue resection for local recurrence -[name] previously reviewed her pathology results, both showed invasive lobular carcinoma, margins were positive. [name] spoke with Dr. Marlou Starks, no more surgery will be offered, because complete surgical resection is unlikely. Patient is also not in favor of more extensive surgery. - We again discussed her that her cancer is unlikely curable but certainly treatable at this stage, and our goal of therapy is palliative for disease control, and prolong her life. She voiced good understanding. - She tolerated Tamoxifen well after switching from anastrozole on 06/30/16 - Restaging PET scan from 04/10/2017 was negative for metastatic disease - She developed more chest wall tenderness and the tumor nodules along the incision slightly bigger and tender at a previous visit; [name] suspected some disease progression. She recommended her to stop tamoxifen, and changed to fulvestrant injection -Pt is  s/p 3 Faslodex injections beginning 06/07/2017. She is tolerating well but presents today with increased fatigue, left breast and chest wall pain, and possible enlarging nodules at incision site.  -At last visit, [name] discussed that if injections alone are not enough, she would recommend adding on CDK4/6 inhibitor, however, the maximum benefit from Faslodex has likely not yet been achieved after initial doses -[name] recommends to give faslodex more time to take effect before adding additional CDK4/6 inhibitor therapy, she is agreeable  -Follow up on CA 27-29 -she will return for faslodex injection on 08/02/17 Lab, f/u, and  injection on 11/1  2. HTN, CAD  - She'll continue follow-up with her primary care physician and cardiologist  4. Osteoporosis - her last DEXA scan in 06/2012 showed osteoporosis  - She continues calcium and Vit D - She was previously encouraged to repeat DEXA at her PCP office; she has not yet done so  5. Chest Pain - Secondary to the small tumor nodules on the chest wall. - moderate pain control with tylenol, she cannot tolerate stronger pain medication.   Plan - Follow-up CA 27-29 -Faslodex monthly, next due 08/02/17 -lab, f/u, injection on 11/1, will consider adding on additional therapy at that time if she has clinical signs of disease progression  -will call her daughter to update her   I spent 20 minutes counseling the patient face to face.  The total time spent in the appointment was 30 minutes.  This document serves as a record of services personally performed by Truitt Merle, MD. It was created on her behalf by Brandt Loosen, a trained medical scribe. The creation of this record is based on the scribe's personal observations and the provider's statements to them. This document has been checked and approved by the attending provider.  Alla Feeling  07/26/2017    I have seen the patient, examined her. I agree with the assessment and and plan and have edited the notes.   Truitt Merle  07/26/2017

## 2017-07-27 LAB — CANCER ANTIGEN 27.29: CA 27.29: 37.7 U/mL (ref 0.0–38.6)

## 2017-08-02 ENCOUNTER — Ambulatory Visit (HOSPITAL_BASED_OUTPATIENT_CLINIC_OR_DEPARTMENT_OTHER): Payer: PPO

## 2017-08-02 ENCOUNTER — Ambulatory Visit: Payer: PPO | Admitting: Hematology

## 2017-08-02 ENCOUNTER — Telehealth: Payer: Self-pay | Admitting: Hematology

## 2017-08-02 ENCOUNTER — Other Ambulatory Visit: Payer: PPO

## 2017-08-02 VITALS — BP 121/54 | HR 90 | Temp 97.9°F | Resp 18

## 2017-08-02 DIAGNOSIS — C44501 Unspecified malignant neoplasm of skin of breast: Secondary | ICD-10-CM | POA: Diagnosis not present

## 2017-08-02 DIAGNOSIS — Z5111 Encounter for antineoplastic chemotherapy: Secondary | ICD-10-CM

## 2017-08-02 DIAGNOSIS — Z23 Encounter for immunization: Secondary | ICD-10-CM

## 2017-08-02 DIAGNOSIS — Z170 Estrogen receptor positive status [ER+]: Principal | ICD-10-CM

## 2017-08-02 DIAGNOSIS — C50112 Malignant neoplasm of central portion of left female breast: Secondary | ICD-10-CM

## 2017-08-02 MED ORDER — FULVESTRANT 250 MG/5ML IM SOLN
500.0000 mg | INTRAMUSCULAR | Status: DC
Start: 2017-08-02 — End: 2017-08-02
  Administered 2017-08-02: 500 mg via INTRAMUSCULAR
  Filled 2017-08-02: qty 10

## 2017-08-02 MED ORDER — INFLUENZA VAC SPLIT QUAD 0.5 ML IM SUSY
0.5000 mL | PREFILLED_SYRINGE | Freq: Once | INTRAMUSCULAR | Status: AC
  Administered 2017-08-02: 0.5 mL via INTRAMUSCULAR
  Filled 2017-08-02: qty 0.5

## 2017-08-02 NOTE — Patient Instructions (Signed)
Influenza Virus Vaccine (Flucelvax) What is this medicine? INFLUENZA VIRUS VACCINE (in floo EN zuh VAHY ruhs vak SEEN) helps to reduce the risk of getting influenza also known as the flu. The vaccine only helps protect you against some strains of the flu. This medicine may be used for other purposes; ask your health care provider or pharmacist if you have questions. COMMON BRAND NAME(S): FLUCELVAX What should I tell my health care provider before I take this medicine? They need to know if you have any of these conditions: -bleeding disorder like hemophilia -fever or infection -Guillain-Barre syndrome or other neurological problems -immune system problems -infection with the human immunodeficiency virus (HIV) or AIDS -low blood platelet counts -multiple sclerosis -an unusual or allergic reaction to influenza virus vaccine, other medicines, foods, dyes or preservatives -pregnant or trying to get pregnant -breast-feeding How should I use this medicine? This vaccine is for injection into a muscle. It is given by a health care professional. A copy of Vaccine Information Statements will be given before each vaccination. Read this sheet carefully each time. The sheet may change frequently. Talk to your pediatrician regarding the use of this medicine in children. Special care may be needed. Overdosage: If you think you've taken too much of this medicine contact a poison control center or emergency room at once. Overdosage: If you think you have taken too much of this medicine contact a poison control center or emergency room at once. NOTE: This medicine is only for you. Do not share this medicine with others. What if I miss a dose? This does not apply. What may interact with this medicine? -chemotherapy or radiation therapy -medicines that lower your immune system like etanercept, anakinra, infliximab, and adalimumab -medicines that treat or prevent blood clots like  warfarin -phenytoin -steroid medicines like prednisone or cortisone -theophylline -vaccines This list may not describe all possible interactions. Give your health care provider a list of all the medicines, herbs, non-prescription drugs, or dietary supplements you use. Also tell them if you smoke, drink alcohol, or use illegal drugs. Some items may interact with your medicine. What should I watch for while using this medicine? Report any side effects that do not go away within 3 days to your doctor or health care professional. Call your health care provider if any unusual symptoms occur within 6 weeks of receiving this vaccine. You may still catch the flu, but the illness is not usually as bad. You cannot get the flu from the vaccine. The vaccine will not protect against colds or other illnesses that may cause fever. The vaccine is needed every year. What side effects may I notice from receiving this medicine? Side effects that you should report to your doctor or health care professional as soon as possible: -allergic reactions like skin rash, itching or hives, swelling of the face, lips, or tongue Side effects that usually do not require medical attention (Report these to your doctor or health care professional if they continue or are bothersome.): -fever -headache -muscle aches and pains -pain, tenderness, redness, or swelling at the injection site -tiredness This list may not describe all possible side effects. Call your doctor for medical advice about side effects. You may report side effects to FDA at 1-800-FDA-1088. Where should I keep my medicine? The vaccine will be given by a health care professional in a clinic, pharmacy, doctor's office, or other health care setting. You will not be given vaccine doses to store at home. NOTE: This sheet is a summary.   It may not cover all possible information. If you have questions about this medicine, talk to your doctor, pharmacist, or health care  provider.  2018 [employer] Standard (2011-09-27 14:06:47) Fulvestrant injection What is this medicine? FULVESTRANT (ful VES trant) blocks the effects of estrogen. It is used to treat breast cancer. This medicine may be used for other purposes; ask your health care provider or pharmacist if you have questions. COMMON BRAND NAME(S): FASLODEX What should I tell my health care provider before I take this medicine? They need to know if you have any of these conditions: -bleeding problems -liver disease -low levels of platelets in the blood -an unusual or allergic reaction to fulvestrant, other medicines, foods, dyes, or preservatives -pregnant or trying to get pregnant -breast-feeding How should I use this medicine? This medicine is for injection into a muscle. It is usually given by a health care professional in a hospital or clinic setting. Talk to your pediatrician regarding the use of this medicine in children. Special care may be needed. Overdosage: If you think you have taken too much of this medicine contact a poison control center or emergency room at once. NOTE: This medicine is only for you. Do not share this medicine with others. What if I miss a dose? It is important not to miss your dose. Call your doctor or health care professional if you are unable to keep an appointment. What may interact with this medicine? -medicines that treat or prevent blood clots like warfarin, enoxaparin, and dalteparin This list may not describe all possible interactions. Give your health care provider a list of all the medicines, herbs, non-prescription drugs, or dietary supplements you use. Also tell them if you smoke, drink alcohol, or use illegal drugs. Some items may interact with your medicine. What should I watch for while using this medicine? Your condition will be monitored carefully while you are receiving this medicine. You will need important blood work done while you are taking this  medicine. Do not become pregnant while taking this medicine or for at least 1 year after stopping it. Women of child-bearing potential will need to have a negative pregnancy test before starting this medicine. Women should inform their doctor if they wish to become pregnant or think they might be pregnant. There is a potential for serious side effects to an unborn child. Men should inform their doctors if they wish to father a child. This medicine may lower sperm counts. Talk to your health care professional or pharmacist for more information. Do not breast-feed an infant while taking this medicine or for 1 year after the last dose. What side effects may I notice from receiving this medicine? Side effects that you should report to your doctor or health care professional as soon as possible: -allergic reactions like skin rash, itching or hives, swelling of the face, lips, or tongue -feeling faint or lightheaded, falls -pain, tingling, numbness, or weakness in the legs -signs and symptoms of infection like fever or chills; cough; flu-like symptoms; sore throat -vaginal bleeding Side effects that usually do not require medical attention (report to your doctor or health care professional if they continue or are bothersome): -aches, pains -constipation -diarrhea -headache -hot flashes -nausea, vomiting -pain at site where injected -stomach pain This list may not describe all possible side effects. Call your doctor for medical advice about side effects. You may report side effects to FDA at 1-800-FDA-1088. Where should I keep my medicine? This drug is given in a hospital or clinic  and will not be stored at home. NOTE: This sheet is a summary. It may not cover all possible information. If you have questions about this medicine, talk to your doctor, pharmacist, or health care provider.  2018 [employer]/Gold Standard (2015-05-14 11:03:55)

## 2017-08-02 NOTE — Telephone Encounter (Signed)
Cancelled lab/fu 10/4. Per 10/4 schedule message patient will have injection only today. Also per 10/4 schedule message added additional lab/fu/injection for 11/1. Spoke with patient she is aware.

## 2017-08-27 DIAGNOSIS — J302 Other seasonal allergic rhinitis: Secondary | ICD-10-CM | POA: Diagnosis not present

## 2017-08-27 DIAGNOSIS — I10 Essential (primary) hypertension: Secondary | ICD-10-CM | POA: Diagnosis not present

## 2017-08-27 DIAGNOSIS — C50312 Malignant neoplasm of lower-inner quadrant of left female breast: Secondary | ICD-10-CM | POA: Diagnosis not present

## 2017-08-30 ENCOUNTER — Telehealth: Payer: Self-pay | Admitting: Hematology

## 2017-08-30 ENCOUNTER — Other Ambulatory Visit (HOSPITAL_BASED_OUTPATIENT_CLINIC_OR_DEPARTMENT_OTHER): Payer: PPO

## 2017-08-30 ENCOUNTER — Ambulatory Visit (HOSPITAL_BASED_OUTPATIENT_CLINIC_OR_DEPARTMENT_OTHER): Payer: PPO | Admitting: Hematology

## 2017-08-30 ENCOUNTER — Encounter: Payer: Self-pay | Admitting: Hematology

## 2017-08-30 ENCOUNTER — Ambulatory Visit (HOSPITAL_BASED_OUTPATIENT_CLINIC_OR_DEPARTMENT_OTHER): Payer: PPO

## 2017-08-30 VITALS — BP 122/68 | HR 70 | Temp 98.0°F | Resp 20 | Ht 61.0 in | Wt 132.6 lb

## 2017-08-30 DIAGNOSIS — Z5111 Encounter for antineoplastic chemotherapy: Secondary | ICD-10-CM

## 2017-08-30 DIAGNOSIS — I10 Essential (primary) hypertension: Secondary | ICD-10-CM | POA: Diagnosis not present

## 2017-08-30 DIAGNOSIS — C50112 Malignant neoplasm of central portion of left female breast: Secondary | ICD-10-CM

## 2017-08-30 DIAGNOSIS — Z170 Estrogen receptor positive status [ER+]: Secondary | ICD-10-CM

## 2017-08-30 DIAGNOSIS — G893 Neoplasm related pain (acute) (chronic): Secondary | ICD-10-CM | POA: Diagnosis not present

## 2017-08-30 DIAGNOSIS — C44501 Unspecified malignant neoplasm of skin of breast: Secondary | ICD-10-CM

## 2017-08-30 DIAGNOSIS — Z7189 Other specified counseling: Secondary | ICD-10-CM | POA: Diagnosis not present

## 2017-08-30 DIAGNOSIS — M810 Age-related osteoporosis without current pathological fracture: Secondary | ICD-10-CM

## 2017-08-30 LAB — COMPREHENSIVE METABOLIC PANEL
ALBUMIN: 3.6 g/dL (ref 3.5–5.0)
ALK PHOS: 53 U/L (ref 40–150)
ALT: 12 U/L (ref 0–55)
AST: 22 U/L (ref 5–34)
Anion Gap: 6 mEq/L (ref 3–11)
BILIRUBIN TOTAL: 0.47 mg/dL (ref 0.20–1.20)
BUN: 22.2 mg/dL (ref 7.0–26.0)
CALCIUM: 9.2 mg/dL (ref 8.4–10.4)
CO2: 30 mEq/L — ABNORMAL HIGH (ref 22–29)
CREATININE: 1 mg/dL (ref 0.6–1.1)
Chloride: 105 mEq/L (ref 98–109)
EGFR: 50 mL/min/{1.73_m2} — ABNORMAL LOW (ref >60)
Glucose: 87 mg/dl (ref 70–140)
Potassium: 4.5 mEq/L (ref 3.5–5.1)
Sodium: 141 mEq/L (ref 136–145)
TOTAL PROTEIN: 6.3 g/dL — AB (ref 6.4–8.3)

## 2017-08-30 LAB — CBC WITH DIFFERENTIAL/PLATELET
BASO%: 1.3 % (ref 0.0–2.0)
BASOS ABS: 0.1 10*3/uL (ref 0.0–0.1)
EOS%: 6.7 % (ref 0.0–7.0)
Eosinophils Absolute: 0.3 10*3/uL (ref 0.0–0.5)
HEMATOCRIT: 36.7 % (ref 34.8–46.6)
HEMOGLOBIN: 12.2 g/dL (ref 11.6–15.9)
LYMPH#: 0.7 10*3/uL — AB (ref 0.9–3.3)
LYMPH%: 15.9 % (ref 14.0–49.7)
MCH: 31.2 pg (ref 25.1–34.0)
MCHC: 33.3 g/dL (ref 31.5–36.0)
MCV: 93.5 fL (ref 79.5–101.0)
MONO#: 0.4 10*3/uL (ref 0.1–0.9)
MONO%: 9.9 % (ref 0.0–14.0)
NEUT%: 66.2 % (ref 38.4–76.8)
NEUTROS ABS: 2.8 10*3/uL (ref 1.5–6.5)
Platelets: 138 10*3/uL — ABNORMAL LOW (ref 145–400)
RBC: 3.92 10*6/uL (ref 3.70–5.45)
RDW: 13.3 % (ref 11.2–14.5)
WBC: 4.3 10*3/uL (ref 3.9–10.3)

## 2017-08-30 MED ORDER — FULVESTRANT 250 MG/5ML IM SOLN
500.0000 mg | INTRAMUSCULAR | Status: DC
  Administered 2017-08-30: 500 mg via INTRAMUSCULAR
  Filled 2017-08-30: qty 10

## 2017-08-30 NOTE — Progress Notes (Signed)
Pt came in to discuss copay assistance for Faslodex.  Unfortunately there aren't any foundations offering copay assistance for breast cancer.  I informed her she can set up a payment plan with the hospital or apply for the [employer] card.  I also informed her of the [employer], went over what it covers and gave her an expense sheet.  She would like to apply so she will bring her proof of income at a later date.  She has my card for any questions or concerns she may have in the future.

## 2017-08-30 NOTE — Progress Notes (Signed)
[place]  [phone] [phone]  OFFICE PROGRESS NOTE  PATIENT: Gloria [name]   DOB: 06/14/1928  MR#: [id number]  JGG#:[id number]  LY:[id number], Gloria Ha, MD Gloria Edison, MD Gloria [name]. Gloria Nakayama, MD  DIAGNOSIS:  An 83 y.o. [place] woman with invasive lobular carcinoma of the left breast diagnosed in 07/2010, chest wall local recurrence in 09/2015 .  Oncology History   Cancer of central portion of left female breast [facility])   Staging form: Breast, AJCC 7th Edition     Pathologic stage from 08/04/2010: Stage IIB (T2, N1a, cM0) - Signed by Truitt Merle, MD on 12/09/2015       Cancer of central portion of left female breast ([place])   08/03/2010 Mammogram     left nipple retraction, and the palpable mass subareolar left breast at the 6:00 position. Ultrasound confirmed the presence of a mass measuring 2.6 x 1.7 x 2.4 cm      08/04/2010 Initial Biopsy    left breast mass biopsy showed an invasive mammary carcinoma with lobular features. ER 95%, PR 97%, Ki-67 17%, HER-2/neu (-)      08/04/2010 Pathologic Stage    Stage IIB: T2 N1a      08/11/2010 Imaging    left breast mass 4.6 x 4.2 x 2.3 cm with enhancement distortion extending to the nipple retraction. There is questionable anterior mediastinal lymph node seen      07/2010 - 09/03/2010 Anti-estrogen oral therapy    neoadjuvant letrozole       09/03/2010 Surgery     left breast mastectomy with sentinel node biopsy on 09/03/2010.      08/2010 - 01/2015 Anti-estrogen oral therapy    Tamoxifen, pt stopped on her own       12/03/2010 - 01/23/2011 Radiation Therapy    left chest wall and axilla radiaiton       10/19/2015 Progression    Left chest wall recurrence, s/p resection on 11/25/2015 with positive margins ([facility])       12/10/2015 - 05/31/2017 Anti-estrogen oral therapy    anastrozole 16m daily, which was switched to Tamoxifen on 06/30/2016 due to persistent disease.      01/03/2016 - 03/03/2016 Radiation  Therapy    Radiation to left chest wall recurrence (12/06/2015-02/04/2016 - 44 Gy), pt progressed through the radiation, and had additional 10 fractions of radiation (02/21/16-03/03/2016 - total 70 Gy to the left chest wall)      07/14/2016 PET scan    PET 07/14/2016 IMPRESSION: 1. No findings of recurrent malignancy. 2. Radiation fibrosis in the lingula. A small left axillary lymph node is not hypermetabolic and only 6 mm in diameter. This is in the vicinity of the prior axillary cyst. 3. Pelvic floor laxity.      07/17/2016 Surgery    Chest wall soft tissue resection for local recurrence      07/17/2016 Pathology Results    Left chest wall two soft tissue resection both showed invasive lobular carcinoma, margins were positive. ER 95% positive, PR 60% positive, HER-2 negative.      07/27/2016 Imaging    CT chest, abdomen and pelvis wo contrast  07/27/2016 IMPRESSION: Sigmoid diverticulosis without acute diverticulitis. No bowel obstruction or acute inflammation. Degenerative disc disease and facet arthropathy of the lower lumbar spine with slight grade 1 anterolisthesis of L4 on L5. Stable appearing simple left-sided renal cysts.      03/01/2017 Mammogram    Mammogram 03/01/2017 IMPRESSION: No mammographic evidence of malignancy. A result  letter of this screening mammogram will be mailed directly to the patient. RECOMMENDATION: Screening mammogram in one year      04/10/2017 Imaging    PET IMPRESSION: 1. Stable exam.  No findings of recurrent malignancy. 2. Changes of external beam radiation again noted within the lingula. 3. Small left axillary lymph node exhibits mild increased uptake. Unchanged from previous exam.      06/07/2017 Miscellaneous    Began Faslodex IM injection after switched from tamoxifen       CURRENT THERAPY:  Tamoxifen 1m daily changed to Faslodex injection  INTERVAL HISTORY:  Today, SSimrahreturns for follow-up. She has tolerated her last four injections  well and without significant difficulty. She has been doing well overall in the interim, but she notes that her pain has worsened to about a 4 overall. She has been taking Tylenol, typically 1-2 daily, with some control over this. She reports that her pain is worse upon palpation. She describes her pain as sharp and it is typically constant and waxing and waning in nature. Her pain is not prohibitive related to her daily activities. Additionally, she reports that she has some moderate, intermittent, sharp pains within the right breast occasionally as well. She denies any pain within the abdomen. She has been gaining weight and she reports that she believes this is due to her injections. She also notes some arthralgic pain within her fingers and feet, this is improved with ambulation and movement. She does report that she has been having issues with the co-pay on her injections, noting that she typically has to pay around $400 each injection.    PAST MEDICAL HISTORY: Past Medical History:  Diagnosis Date  . Anxiety   . Blood in urine   . CAD (coronary artery disease)    non obstructive by cath  . Cancer ([facility]    breast- left  . Depression   . GERD (gastroesophageal reflux disease)   . Hypertension   . Knee fracture   . Personal history of radiation therapy 2017  . TR (tricuspid regurgitation)    mild by Echo 12/2008 EF >55%    PAST SURGICAL HISTORY: Past Surgical History:  Procedure Laterality Date  . ABDOMINAL AORTAGRAM N/A 09/08/2011   Procedure: ABDOMINAL AMaxcine Ham  Surgeon: TTroy Sine MD;  [facility];  Service: Cardiovascular;  Laterality: N/A;  . ABDOMINAL HYSTERECTOMY    . BREAST EXCISIONAL BIOPSY Left 2017   X3  . BREAST SURGERY  08-30-10   mastectomy  . CARDIAC CATHETERIZATION  08/2011   20% LAD stenosis, 20% diagonal stenosis, 10-20% proximal dominant RCA stenosis  . COLON SURGERY    . LEFT HEART CATHETERIZATION WITH CORONARY ANGIOGRAM N/A 09/08/2011    Procedure: LEFT HEART CATHETERIZATION WITH CORONARY ANGIOGRAM;  Surgeon: TTroy Sine MD;  [facility];  Service: Cardiovascular;  Laterality: N/A;  . MASTECTOMY    . MINOR BREAST BIOPSY Left 11/25/2015   Procedure: MINOR EXCISION MASS LEFT CHEST WALL;  Surgeon: PAutumn MessingIII, MD;  [place]  Service: General;  Laterality: Left;  . MINOR BREAST BIOPSY Left 07/17/2016   Procedure: EXCISION OF 2 LEFT CHEST WALL MASSES;  Surgeon: PAutumn MessingIII, MD;  [place] [facility]  Service: General;  Laterality: Left;  . TONSILLECTOMY      FAMILY HISTORY: Family History  Problem Relation Age of Onset  . Cancer Father        prostate  . Cancer Sister  breast  . Heart disease Sister   . Breast cancer Sister   . Cancer Brother        pancreatic  . Cancer Maternal Grandmother        colon  . Dementia Mother   . Sudden death Brother   . Heart attack Brother   . Heart attack Brother   . Arthritis Sister   . Breast cancer Maternal Aunt     SOCIAL HISTORY: Social History  Substance Use Topics  . Smoking status: Former Smoker    Quit date: 11/15/1991  . Smokeless tobacco: Never Used  . Alcohol use 1.2 oz/week    2 Glasses of wine per week     Comment: 1 glass per week    ALLERGIES: Allergies  Allergen Reactions  . Contrast Media [Iodinated Diagnostic Agents] Anaphylaxis  . Penicillins Anaphylaxis and Swelling    Has patient had a PCN reaction causing immediate rash, facial/tongue/throat swelling, SOB or lightheadedness with hypotension: Yes Has patient had a PCN reaction causing severe rash involving mucus membranes or skin necrosis: No Has patient had a PCN reaction that required hospitalization Yes Has patient had a PCN reaction occurring within the last 10 years: No If all of the above answers are "NO", then may proceed with Cephalosporin use.   . Adhesive [Tape] Other (See Comments)    blisters  . Cephalexin Swelling  .  Ciprofloxacin Swelling  . Demerol Nausea And Vomiting  . Sulfamethoxazole-Trimethoprim Itching and Swelling    REACTION: swelling/hives  . Quinolones Itching and Rash    REACTION: itching, rash Pt. Reports no problems with levaquin     MEDICATIONS:  Current Outpatient Prescriptions  Medication Sig Dispense Refill  . acetaminophen (TYLENOL) 500 MG tablet Take 1,000 mg by mouth daily as needed for moderate pain. Reported on 03/10/2016    . ALPRAZolam (XANAX) 0.25 MG tablet Take 1/2 tablet in am and 1 tablet prior to bedtime. 45 tablet 2  . aspirin 81 MG chewable tablet Chew 81 mg by mouth once a week. Reported on 03/10/2016    . CALCIUM-MAGNESIUM-VITAMIN D PO Take 1 tablet by mouth 2 (two) times daily.    . cetirizine (ZYRTEC) 10 MG tablet Take 10 mg by mouth as needed for allergies.     Marland Kitchen doxepin (SINEQUAN) 10 MG capsule Take 10 mg by mouth at bedtime.     . fluconazole (DIFLUCAN) 100 MG tablet Take 1 tablet (100 mg total) by mouth daily. 1 tablet 0  . fluticasone (FLONASE) 50 MCG/ACT nasal spray Place 1 spray into both nostrils daily as needed for allergies.     Marland Kitchen guaiFENesin (MUCINEX) 600 MG 12 hr tablet Take 600 mg by mouth daily as needed for cough or to loosen phlegm. Reported on 03/28/2016    . ipratropium (ATROVENT) 0.06 % nasal spray Place 2 sprays into both nostrils 4 (four) times daily. 15 mL 0  . losartan (COZAAR) 25 MG tablet Take 25 mg by mouth daily.   0  . nitroGLYCERIN (NITROSTAT) 0.4 MG SL tablet Place 1 tablet (0.4 mg total) under the tongue every 5 (five) minutes as needed for chest pain. <PLEASE MAKE APPOINTMENT> (Patient not taking: Reported on 07/26/2017) 25 tablet 3  . potassium chloride (K-DUR) 10 MEQ tablet Take 10 mEq by mouth as needed.     . pseudoephedrine-acetaminophen (TYLENOL SINUS) 30-500 MG TABS tablet Take 1 tablet by mouth every 4 (four) hours as needed.    . triamcinolone (KENALOG) 0.025 % cream Apply 1  application topically 2 (two) times daily. 30 g 0    No current facility-administered medications for this visit.    Facility-Administered Medications Ordered in Other Visits  Medication Dose Route Frequency Provider Last Rate Last Dose  . fulvestrant (FASLODEX) injection 500 mg  500 mg Intramuscular Q14 Days Truitt Merle, MD   500 mg at 08/30/17 1455    REVIEW OF SYSTEMS:  A 10 point review of systems was completed and is negative except as noted above.    PHYSICAL EXAMINATION:  BP 122/68 (BP Location: Left Arm, Patient Position: Sitting)   Pulse 70   Temp 98 F (36.7 C) (Oral)   Resp 20   Ht 5' 1"  (1.549 m)   Wt 132 lb 9.6 oz (60.1 kg)   SpO2 99%   BMI 25.05 kg/m    GENERAL: Patient is a well appearing female in no acute distress HEENT:  Sclerae anicteric.  Oropharynx clear and moist. No ulcerations or evidence of oropharyngeal candidiasis. Neck is supple.  NODES:   There is no cervical , supraclavicular, or axillary lymphadenopathy palpated.  LUNGS:  Clear to auscultation bilaterally.  No wheezes or rhonchi. HEART:  Regular rate and rhythm. No murmur appreciated.  Chest:  ABDOMEN:  Soft, nontender.  Positive, normoactive bowel sounds. No organomegaly palpated.  MSK:  No focal spinal tenderness to palpation. Full range of motion bilaterally in the upper extremities. EXTREMITIES:  No peripheral edema.   SKIN:  Clear with no obvious rashes or skin changes.  NEURO:  Nonfocal. Well oriented.  Appropriate affect. BREAST EXAM:  Left breast is surgically absent,  incision in the left frontal chest has healed well.  Mild soft tissue fullness and edema around the incision, with skin retraction. Multiple 5-15 mm nodules along the incision line, slightly larger than before. Soft tissues are slightly more tender than previous visits. Tissues swollen around incision line. Right breast and bilateral axilla notable for no masses or nodules.  RECTUM: external and internal hemorrhoids, no palpable abnormalities. Non-tender.   Nodule on left  side?  ECOG FS:  Grade 1 - Symptomatic but completely ambulatory   LAB RESULTS: CBC Latest Ref Rng & Units 08/30/2017 07/26/2017 05/31/2017  WBC 3.9 - 10.3 10e3/uL 4.3 3.9 4.5  Hemoglobin 11.6 - 15.9 g/dL 12.2 12.5 13.1  Hematocrit 34.8 - 46.6 % 36.7 37.8 39.5  Platelets 145 - 400 10e3/uL 138(L) 129(L) 122(L)      CMP Latest Ref Rng & Units 08/30/2017 07/26/2017 05/31/2017  Glucose 70 - 140 mg/dl 87 97 91  BUN 7.0 - 26.0 mg/dL 22.2 20.2 19.1  Creatinine 0.6 - 1.1 mg/dL 1.0 0.9 1.0  Sodium 136 - 145 mEq/L 141 143 141  Potassium 3.5 - 5.1 mEq/L 4.5 4.1 4.4  Chloride 101 - 111 mmol/L - - -  CO2 22 - 29 mEq/L 30(H) 28 29  Calcium 8.4 - 10.4 mg/dL 9.2 9.4 9.2  Total Protein 6.4 - 8.3 g/dL 6.3(L) 6.4 6.3(L)  Total Bilirubin 0.20 - 1.20 mg/dL 0.47 0.40 0.53  Alkaline Phos 40 - 150 U/L 53 64 46  AST 5 - 34 U/L 22 23 19   ALT 0 - 55 U/L 12 10 8    CA antigen 27.29 on 03/21/17 was 32.7  PATHOLOGY REPORT Diagnosis 11/25/2015 Soft tissue mass, simple excision, Left chest wall - INVASIVE LOBULAR CARCINOMA, PRESENT AT NON ORIENTED TISSUE EDGE(S). - SEE COMMENT. Microscopic Comment The carcinoma appears grade 2. A breast prognostic profile will be performed and the results reported separately. (JDP:gt,  11/26/15).  Results: IMMUNOHISTOCHEMICAL AND MORPHOMETRIC ANALYSIS PERFORMED MANUALLY Estrogen Receptor: 95%, POSITIVE, STRONG STAINING INTENSITY Progesterone Receptor: 60%, POSITIVE, STRONG STAINING INTENSITY Results: HER2 - NEGATIVE RATIO OF HER2/CEP17 SIGNALS 1.07 AVERAGE HER2 COPY NUMBER PER CELL 2.90  Diagnosis 07/17/2016 1. Soft tissue mass, simple excision, Chest wall medial left - INVASIVE LOBULAR CARCINOMA, SEE COMMENT. - NODULE OF FIBROSIS AND CALCIFICATION. 2. Soft tissue mass, simple excision, Chest wall lateral left - INVASIVE LOBULAR CARCINOMA, SEE COMMENT. Microscopic Comment 1. -2. Both parts reveal foci of invasive lobular carcinoma. There is only limited tumor in part #1.  Tumor does extend to the uninked edges of both parts. The carcinoma appears grade 2. A prognostic panel has been ordered on part #2.  2. PROGNOSTIC INDICATORS Results: IMMUNOHISTOCHEMICAL AND MORPHOMETRIC ANALYSIS PERFORMED MANUALLY Estrogen Receptor: 60%, POSITIVE, STRONG STAINING INTENSITY Progesterone Receptor: 90%, POSITIVE, STRONG STAINING INTENSITY Proliferation Marker Ki67: 10%  Results: HER2 - NEGATIVE RATIO OF HER2/CEP17 SIGNALS 1.20 AVERAGE HER2 COPY NUMBER PER CELL 2.40   RADIOGRAPHIC STUDIES:  PET 04/10/17 IMPRESSION: 1. Stable exam.  No findings of recurrent malignancy. 2. Changes of external beam radiation again noted within the lingula. 3. Small left axillary lymph node exhibits mild increased uptake. Unchanged from previous exam.   Mammogram 03/01/2017 IMPRESSION: No mammographic evidence of malignancy. A result letter of this screening mammogram will be mailed directly to the patient. RECOMMENDATION: Screening mammogram in one year   CT chest, abdomen and pelvis wo contrast  07/27/2016 IMPRESSION: Sigmoid diverticulosis without acute diverticulitis. No bowel obstruction or acute inflammation. Degenerative disc disease and facet arthropathy of the lower lumbar spine with slight grade 1 anterolisthesis of L4 on L5. Stable appearing simple left-sided renal cysts.  PET 07/14/2016 IMPRESSION: 1. No findings of recurrent malignancy. 2. Radiation fibrosis in the lingula. A small left axillary lymph node is not hypermetabolic and only 6 mm in diameter. This is in the vicinity of the prior axillary cyst. 3. Pelvic floor laxity.  ASSESSMENT/PLAN: 83 y.o. female with: invasive lobular carcinoma of the left breast   1. Cancer of the central portion of left female breast, Stage IIB, T2 N1 invasive lobular carcinoma, grade 2, ER 95%, PR 97%, Ki-67 17%, HER-2/neu no amplification, diagnosed in 07/2010, local chest wall recurrence in 09/2015   -She was on adjuvant  tamoxifen for 4.5 years, stopped on her own. -She unfortunately developed local recurrence 8 months after she stopped tamoxifen. -I previously reviewed her surgical pathology findings, which is consistent with lobular carcinoma recurrence from her previous breast cancer. -The tumor was surgically removed, with positive margins. -Her restaging CT scan and bone scan in 12/2015 was negative for distant metastasis. -She has completed chest wall radiation, however developed 2-3 small nodules during the radiation and anastrozole therapy, supicious for residual cancer. US showed a 4 mm and 6 mm subcutaneous nodule, partially calcified, highly suspicious for local recurrence. -She subsequently underwent a second chest wall surgery for two soft tissue resection, I previously reviewed her pathology results, both showed invasive lobular carcinoma, margins were positive. I spoke with Dr. Marlou Starks, no more surgery will be offered, because complete surgical resection is unlikely. Patient is also not in favor of more extensive surgery. - We previously discussed her that her cancer is unlikely curable at this stage, and our goal of therapy is palliative for disease control, and prolong her life. She voiced good understanding. - I reviewed her restaging PET scan from 04/10/2017, which was negative for metastatic disease - She has developed more  chest wall tenderness, the tumor nodules along the incision signs are slightly bigger and tender, I think she has had some disease progression. I recommend her to stop tamoxifen, and changed to fulvestrant injection -She has been tolerating Faslodex injections well, we will continue this unless she were to develop more prohibitive symptoms.  If injections are not enough alone, I will add on a CDK4/6 inhibitor, she is agreeable. -repeat CT and DEXA scan next month, will rule out other metastasis, especially rib invasion from the local recurrence.  2. HTN, CAD  - She'll continue  follow-up with her primary care physician and cardiologist  3. Left shoulder blade pain - Chronic, intermittent, mild, for the past year. Prior bone scan was negative - Possible muscular related, we'll continue monitoring - near resolved lately    4. Osteoporosis - her last DEXA scan in 06/2012 showed osteoporosis  - I previously encouraged her to continue calcium and Vit D - I previously encouraged her to repeat DEXA at her PCP office   5. Chest Pain - Secondary to the small tumor nodules on the chest wall. - Manageable, she cannot take much pain medication.  - Since she has been having continued pain, I did offer her Gabapentin; however, she declined this for now. I encouraged her to take Tylenol and Ibuprofen for further management. I also encouraged her to keep her fluid intake up while taking these medications.   6. Financial stress  -She has about $400 co-pay for fulvestrant injection, I will send her to our financial officer to see if we can locate breast cancer fund to cover her co-pay.  7. Goal of care discussion  -We again discussed the incurable nature of her cancer, and the overall poor prognosis, especially if he/she does not have good response to chemotherapy or progress on chemo -The patient understands the goal of care is palliative. -I recommend DNR/DNI, she will think about it   Plan F/u and injection in 4 weeks  Lab and CT and Bone scan a few days before  We will have her speak with our financial office regarding her high copay for her injections  I called her daughter and updated her   I spent 20 minutes counseling the patient face to face.  The total time spent in the appointment was 30 minutes.  This document serves as a record of services personally performed by Truitt Merle, MD. It was created on her behalf by Reola Mosher, a trained medical scribe. The creation of this record is based on the scribe's personal observations and the provider's statements to  them. This document has been checked and approved by the attending provider.  Truitt Merle  08/30/2017

## 2017-08-30 NOTE — Patient Instructions (Signed)

## 2017-08-30 NOTE — Telephone Encounter (Signed)
gave avs and calendar for November

## 2017-08-31 LAB — CANCER ANTIGEN 27.29: CA 27.29: 33.5 U/mL (ref 0.0–38.6)

## 2017-09-03 ENCOUNTER — Encounter: Payer: Self-pay | Admitting: Hematology

## 2017-09-03 NOTE — Progress Notes (Signed)
Pt is approved for the $1000 [employer] grant.  

## 2017-09-17 IMAGING — US US BREAST LTD UNI LEFT INC AXILLA
1 series · 5 of 5 positions shown · non-contrast
Comparison: Previous exam(s).

CLINICAL DATA: 87-year-old female presenting for palpable areas of
concern in the left breast. The patient is status post left
mastectomy in Monday August, 2010.

EXAM:
DIGITAL DIAGNOSTIC RIGHT MAMMOGRAM WITH 3D TOMOSYNTHESIS AND CAD
LEFT BREAST ULTRASOUND

[Series 1: advbreast · 5 of 5 slices shown]
[im 1/5]
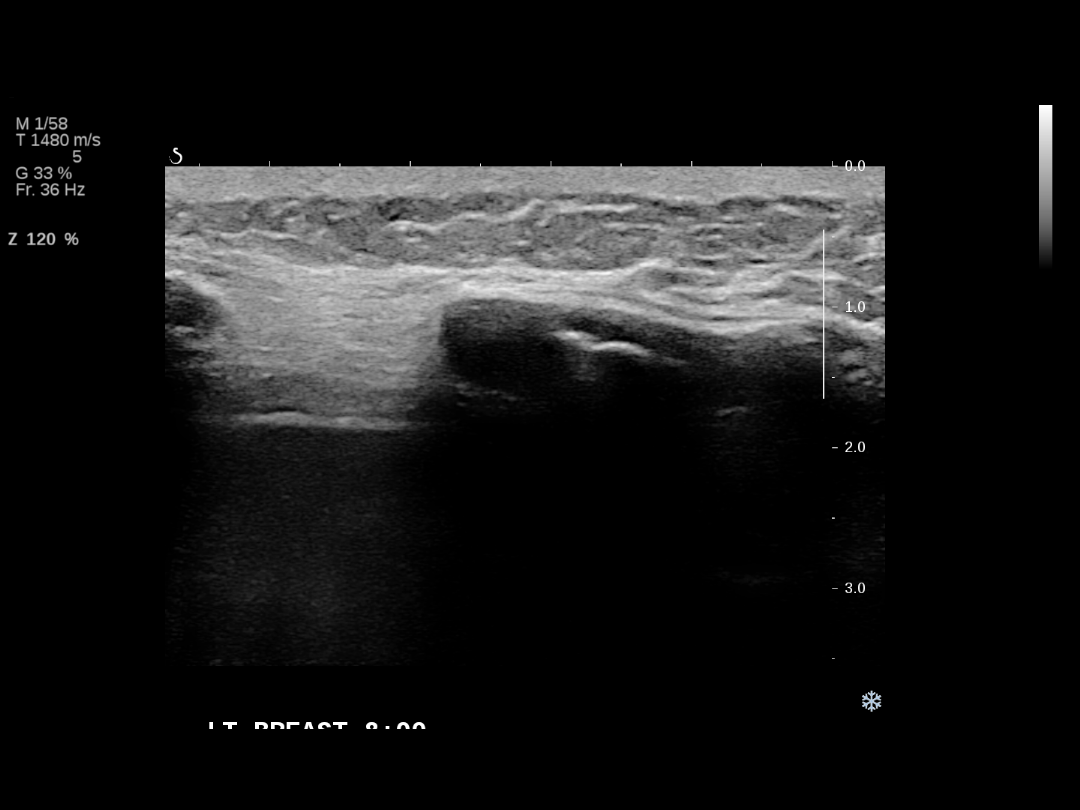
[im 2/5]
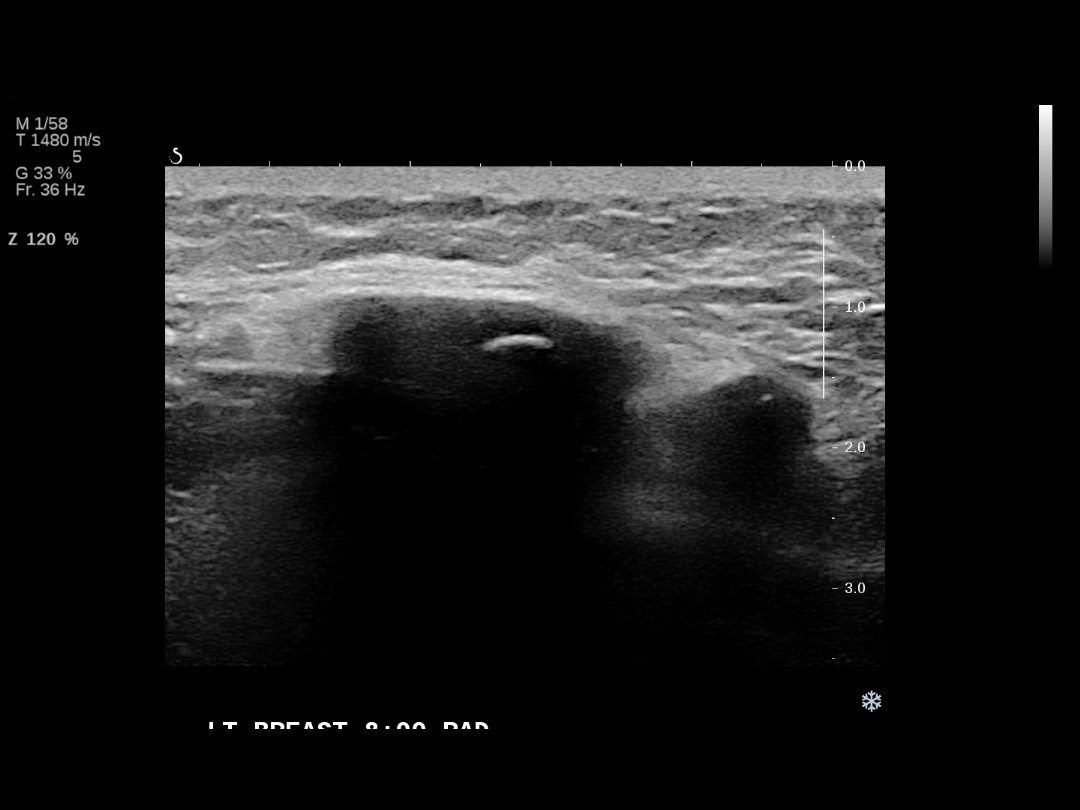
[im 3/5]
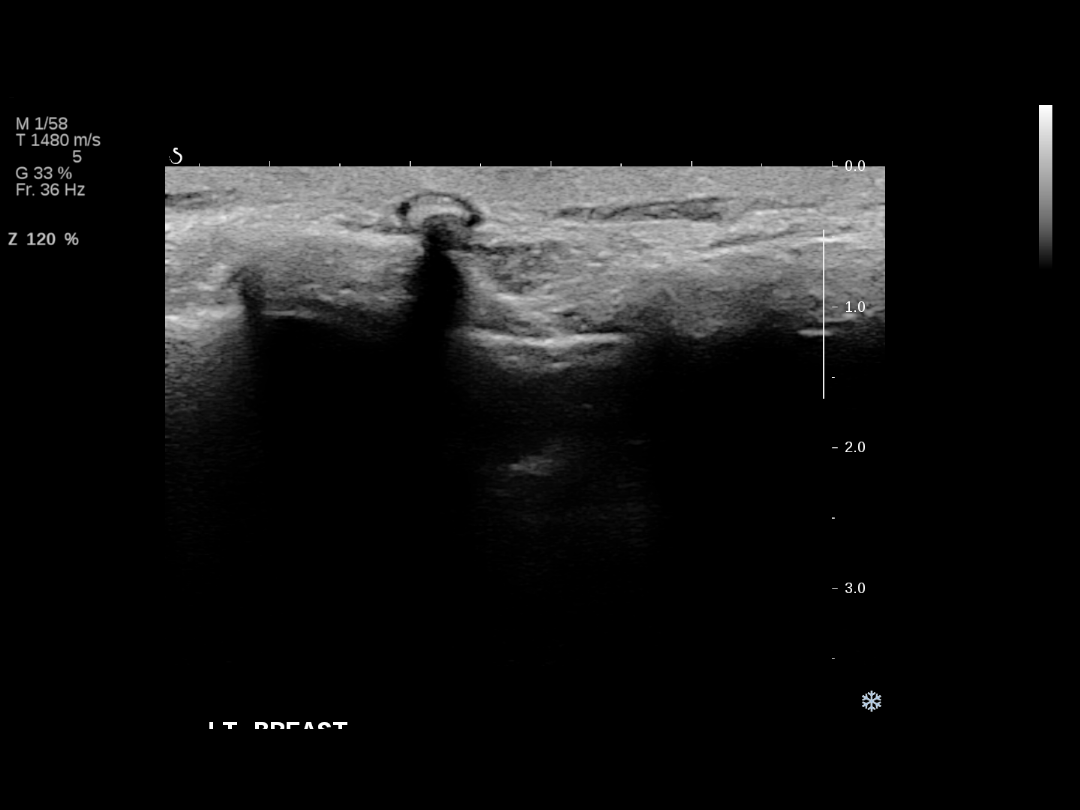
[im 4/5]
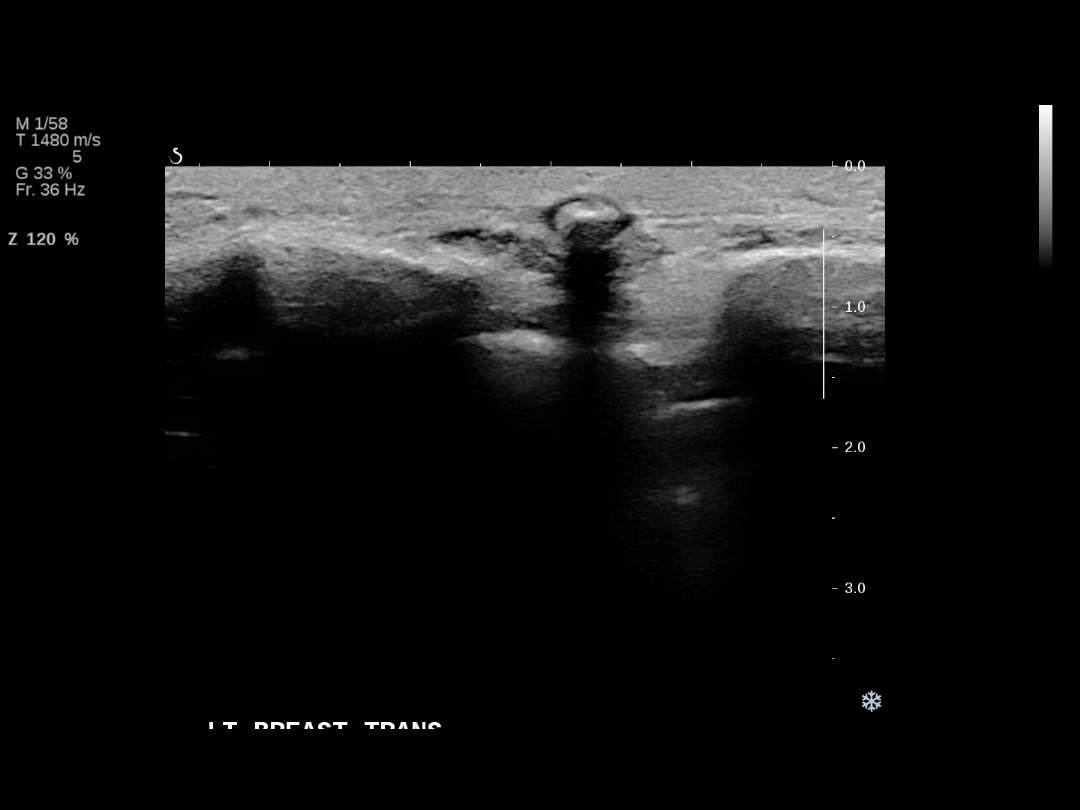
[im 5/5]
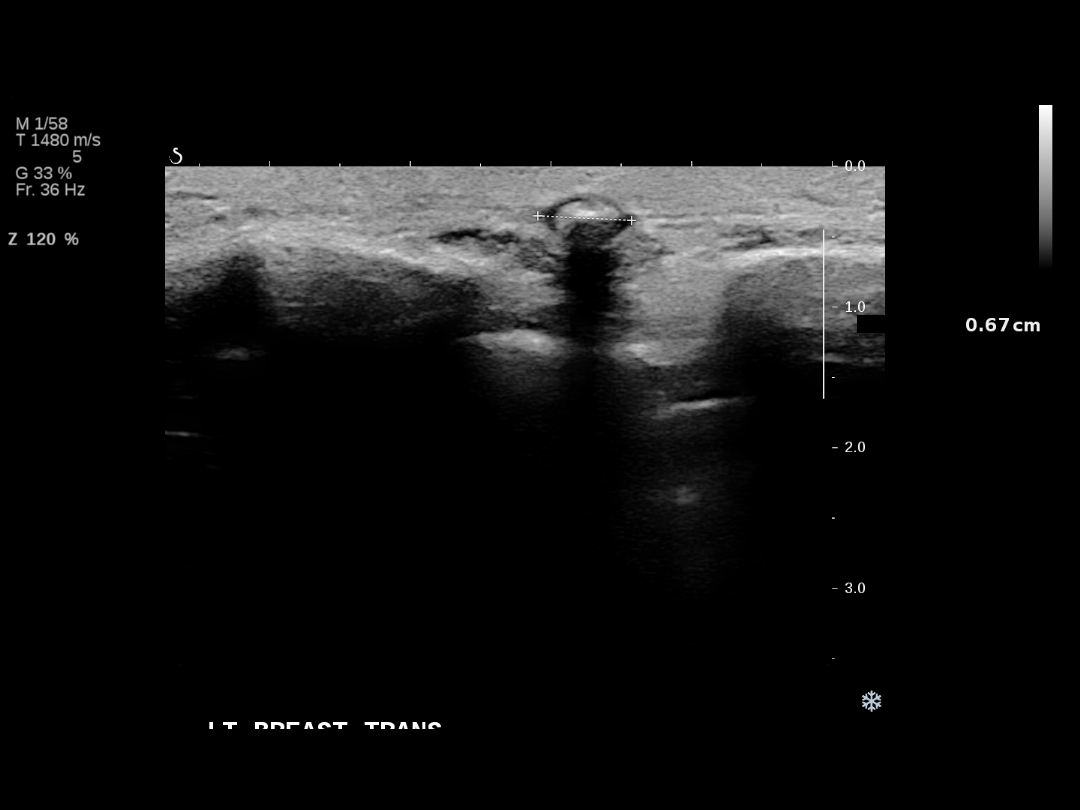

[5 of 5 positions shown; findings below may reference images not displayed]

ACR Breast Density Category c: The breast tissue is heterogeneously
dense, which may obscure small masses.
FINDINGS: No suspicious calcifications, masses or areas of distortion are seen
in the right breast. On the mammogram images of view left mastectomy
site, there is no definite mass identified.

Mammographic images were processed with CAD.

Physical exam of the palpable area of concern along the medial
aspect inferior to the mastectomy scar, there is a soft area of
tissue, with the texture of normal subcutaneous fat. No suspicious
masses are palpated. Just lateral to this area, there is a tiny
nodular palpable mass.

Ultrasound targeted to the site of the patient's palpable concern,
normal subcutaneous fat is identified. Just lateral to this at the
palpable site labeled central left breast, there is a 7 mm oval mass
just deep to the skin, which has a peripheral hypoechoic area and a
centrally hyperechoic area. This is suspected to represent fat
necrosis.
IMPRESSION: 1. There is a 7 mm mass centrally in the left chest at the
mastectomy site. While this is suspected to represent fat necrosis,
a solid mass cannot be excluded.

2. The palpable area of concern by the patient corresponds with
normal subcutaneous fat by mammogram and ultrasound.

3.  No mammographic evidence of right breast malignancy.

RECOMMENDATION:
1. Ultrasound-guided biopsy is recommended for the 7 mm mass in the
central left breast. This has been scheduled for 10/19/2015 at 2
p.m..

2. No mammographic or targeted sonographic correlate for the
palpable area of concern in the medial left breast. Any further
workup of the palpable area should be based on clinical assessment.

I have discussed the findings and recommendations with the patient.
Results were also provided in writing at the conclusion of the
visit. If applicable, a reminder letter will be sent to the patient
regarding the next appointment.

BI-RADS CATEGORY  4: Suspicious.

## 2017-09-18 ENCOUNTER — Other Ambulatory Visit: Payer: Self-pay | Admitting: *Deleted

## 2017-09-18 ENCOUNTER — Telehealth: Payer: Self-pay | Admitting: *Deleted

## 2017-09-18 DIAGNOSIS — C50112 Malignant neoplasm of central portion of left female breast: Secondary | ICD-10-CM

## 2017-09-18 MED ORDER — DIPHENHYDRAMINE HCL 50 MG PO TABS: 50.0000 mg | ORAL_TABLET | ORAL | 0 refills | Status: DC

## 2017-09-18 MED ORDER — PREDNISONE 50 MG PO TABS: ORAL_TABLET | ORAL | 0 refills | Status: DC

## 2017-09-18 NOTE — Telephone Encounter (Signed)
Spoke with pt and gave pt verbal instructions for pre meds for CT scans.  Pt able to verbalize back correct instructions on how to take Prednisone and Benadryl pre meds to nurse. Prednisone 50 mg and Benadryl 50 mg called in to pharmacy per pt's request.

## 2017-09-19 ENCOUNTER — Encounter (HOSPITAL_COMMUNITY): Payer: PPO

## 2017-09-19 ENCOUNTER — Telehealth: Payer: Self-pay | Admitting: Cardiovascular Disease

## 2017-09-19 ENCOUNTER — Encounter (HOSPITAL_COMMUNITY): Payer: Self-pay

## 2017-09-19 ENCOUNTER — Other Ambulatory Visit: Payer: Self-pay | Admitting: Hematology

## 2017-09-19 ENCOUNTER — Encounter (HOSPITAL_COMMUNITY)
Admission: RE | Admit: 2017-09-19 | Discharge: 2017-09-19 | Disposition: A | Discharge status: Home / Self Care | Payer: PPO | Source: Ambulatory Visit | Attending: Hematology | Admitting: Hematology

## 2017-09-19 ENCOUNTER — Ambulatory Visit (HOSPITAL_COMMUNITY)
Admission: RE | Admit: 2017-09-19 | Discharge: 2017-09-19 | Disposition: A | Discharge status: Home / Self Care | Payer: PPO | Source: Ambulatory Visit | Attending: Hematology | Admitting: Hematology

## 2017-09-19 DIAGNOSIS — Z170 Estrogen receptor positive status [ER+]: Secondary | ICD-10-CM | POA: Insufficient documentation

## 2017-09-19 DIAGNOSIS — C50112 Malignant neoplasm of central portion of left female breast: Secondary | ICD-10-CM

## 2017-09-19 DIAGNOSIS — N281 Cyst of kidney, acquired: Secondary | ICD-10-CM | POA: Diagnosis not present

## 2017-09-19 DIAGNOSIS — Z9012 Acquired absence of left breast and nipple: Secondary | ICD-10-CM | POA: Diagnosis not present

## 2017-09-19 DIAGNOSIS — Z923 Personal history of irradiation: Secondary | ICD-10-CM | POA: Insufficient documentation

## 2017-09-19 DIAGNOSIS — C50912 Malignant neoplasm of unspecified site of left female breast: Secondary | ICD-10-CM | POA: Diagnosis not present

## 2017-09-19 MED ORDER — IOPAMIDOL (ISOVUE-300) INJECTION 61%
INTRAVENOUS | Status: AC
  Filled 2017-09-19: qty 100

## 2017-09-19 MED ORDER — TECHNETIUM TC 99M MEDRONATE IV KIT
25.0000 | PACK | Freq: Once | INTRAVENOUS | Status: AC | PRN
  Administered 2017-09-19: 21.3 via INTRAVENOUS

## 2017-09-19 MED ORDER — IOPAMIDOL (ISOVUE-370) INJECTION 76%: 100.0000 mL | Freq: Once | INTRAVENOUS | Status: DC | PRN

## 2017-09-19 NOTE — Telephone Encounter (Signed)
New Message  Pt daughter called requesting to speak with RN about pt being allergic to the dye used for the CT scan. .please call back to discuss

## 2017-09-19 NOTE — Telephone Encounter (Signed)
Contrast media has been added to allergy list

## 2017-09-19 NOTE — Telephone Encounter (Signed)
Returned call to pt daughter Corporate investment banker) she states that pt went for CT today and since pt is allergic to contrast dye daughter states that she wanted it done without contrast and they did only CT without contrast. She states that pt did take the pre-medication this morning. She just wanted you to know

## 2017-09-23 NOTE — Telephone Encounter (Signed)
ok 

## 2017-09-24 ENCOUNTER — Other Ambulatory Visit (HOSPITAL_BASED_OUTPATIENT_CLINIC_OR_DEPARTMENT_OTHER): Payer: PPO

## 2017-09-24 DIAGNOSIS — C50112 Malignant neoplasm of central portion of left female breast: Secondary | ICD-10-CM

## 2017-09-24 DIAGNOSIS — C44501 Unspecified malignant neoplasm of skin of breast: Secondary | ICD-10-CM | POA: Diagnosis not present

## 2017-09-24 DIAGNOSIS — Z170 Estrogen receptor positive status [ER+]: Secondary | ICD-10-CM | POA: Diagnosis not present

## 2017-09-24 LAB — CBC WITH DIFFERENTIAL/PLATELET
BASO%: 0.6 % (ref 0.0–2.0)
BASOS ABS: 0 10*3/uL (ref 0.0–0.1)
EOS ABS: 0.3 10*3/uL (ref 0.0–0.5)
EOS%: 9.6 % — AB (ref 0.0–7.0)
HEMATOCRIT: 39.6 % (ref 34.8–46.6)
HEMOGLOBIN: 12.9 g/dL (ref 11.6–15.9)
LYMPH#: 0.7 10*3/uL — AB (ref 0.9–3.3)
LYMPH%: 21.3 % (ref 14.0–49.7)
MCH: 31 pg (ref 25.1–34.0)
MCHC: 32.6 g/dL (ref 31.5–36.0)
MCV: 95.2 fL (ref 79.5–101.0)
MONO#: 0.2 10*3/uL (ref 0.1–0.9)
MONO%: 6.6 % (ref 0.0–14.0)
NEUT#: 2.1 10*3/uL (ref 1.5–6.5)
NEUT%: 61.9 % (ref 38.4–76.8)
PLATELETS: 133 10*3/uL — AB (ref 145–400)
RBC: 4.16 10*6/uL (ref 3.70–5.45)
RDW: 13.2 % (ref 11.2–14.5)
WBC: 3.3 10*3/uL — ABNORMAL LOW (ref 3.9–10.3)

## 2017-09-24 LAB — COMPREHENSIVE METABOLIC PANEL
ALT: 13 U/L (ref 0–55)
ANION GAP: 9 meq/L (ref 3–11)
AST: 18 U/L (ref 5–34)
Albumin: 3.6 g/dL (ref 3.5–5.0)
Alkaline Phosphatase: 57 U/L (ref 40–150)
BILIRUBIN TOTAL: 0.44 mg/dL (ref 0.20–1.20)
BUN: 13.3 mg/dL (ref 7.0–26.0)
CHLORIDE: 106 meq/L (ref 98–109)
CO2: 26 meq/L (ref 22–29)
Calcium: 9.2 mg/dL (ref 8.4–10.4)
Creatinine: 0.9 mg/dL (ref 0.6–1.1)
Glucose: 105 mg/dl (ref 70–140)
POTASSIUM: 3.9 meq/L (ref 3.5–5.1)
Sodium: 142 mEq/L (ref 136–145)
Total Protein: 6.2 g/dL — ABNORMAL LOW (ref 6.4–8.3)

## 2017-09-25 LAB — CANCER ANTIGEN 27.29: CAN 27.29: 32.9 U/mL (ref 0.0–38.6)

## 2017-09-26 NOTE — Progress Notes (Signed)
[place]  Telephone:([phone]) [phone] Fax:(336) [phone]  OFFICE PROGRESS NOTE  PATIENT: Gloria [name]   DOB: 1928-03-28  MR#: [id number]  OIN#:[id number]  BS:[id number], [name], MD [name], MD [name]. [name], MD  DIAGNOSIS:  An 83 y.o. [place] woman with invasive lobular carcinoma of the left breast diagnosed in 07/2010, chest wall local recurrence in 09/2015 .  Oncology History   Cancer of central portion of left female breast [facility])   Staging form: Breast, AJCC 7th Edition     Pathologic stage from 08/04/2010: Stage IIB (T2, N1a, cM0) - Signed by Truitt Merle, MD on 12/09/2015       Cancer of central portion of left female breast ([place])   08/03/2010 Mammogram     left nipple retraction, and the palpable mass subareolar left breast at the 6:00 position. Ultrasound confirmed the presence of a mass measuring 2.6 x 1.7 x 2.4 cm      08/04/2010 Initial Biopsy    left breast mass biopsy showed an invasive mammary carcinoma with lobular features. ER 95%, PR 97%, Ki-67 17%, HER-2/neu (-)      08/04/2010 Pathologic Stage    Stage IIB: T2 N1a      08/11/2010 Imaging    left breast mass 4.6 x 4.2 x 2.3 cm with enhancement distortion extending to the nipple retraction. There is questionable anterior mediastinal lymph node seen      07/2010 - 09/03/2010 Anti-estrogen oral therapy    neoadjuvant letrozole       09/03/2010 Surgery     left breast mastectomy with sentinel node biopsy on 09/03/2010.      08/2010 - 01/2015 Anti-estrogen oral therapy    Tamoxifen, pt stopped on her own       12/03/2010 - 01/23/2011 Radiation Therapy    left chest wall and axilla radiaiton       10/19/2015 Progression    Left chest wall recurrence, s/p resection on 11/25/2015 with positive margins ([facility])       12/10/2015 - 05/31/2017 Anti-estrogen oral therapy    anastrozole 99m daily, which was switched to Tamoxifen on 06/30/2016 due to persistent disease.      01/03/2016 - 03/03/2016 Radiation  Therapy    Radiation to left chest wall recurrence (12/06/2015-02/04/2016 - 44 Gy), pt progressed through the radiation, and had additional 10 fractions of radiation (02/21/16-03/03/2016 - total 70 Gy to the left chest wall)      07/14/2016 PET scan    PET 07/14/2016 IMPRESSION: 1. No findings of recurrent malignancy. 2. Radiation fibrosis in the lingula. A small left axillary lymph node is not hypermetabolic and only 6 mm in diameter. This is in the vicinity of the prior axillary cyst. 3. Pelvic floor laxity.      07/17/2016 Surgery    Chest wall soft tissue resection for local recurrence      07/17/2016 Pathology Results    Left chest wall two soft tissue resection both showed invasive lobular carcinoma, margins were positive. ER 95% positive, PR 60% positive, HER-2 negative.      07/27/2016 Imaging    CT chest, abdomen and pelvis wo contrast  07/27/2016 IMPRESSION: Sigmoid diverticulosis without acute diverticulitis. No bowel obstruction or acute inflammation. Degenerative disc disease and facet arthropathy of the lower lumbar spine with slight grade 1 anterolisthesis of L4 on L5. Stable appearing simple left-sided renal cysts.      03/01/2017 Mammogram    Mammogram 03/01/2017 IMPRESSION: No mammographic evidence of malignancy. A result  letter of this screening mammogram will be mailed directly to the patient. RECOMMENDATION: Screening mammogram in one year      04/10/2017 Imaging    PET IMPRESSION: 1. Stable exam.  No findings of recurrent malignancy. 2. Changes of external beam radiation again noted within the lingula. 3. Small left axillary lymph node exhibits mild increased uptake. Unchanged from previous exam.      06/07/2017 -  Anti-estrogen oral therapy    Stopped Tamoxifen and Began Faslodex IM injection on 06/07/17       09/19/2017 Imaging    CT CAP WO Contrast 09/19/17 IMPRESSION: 1. Surgical changes from a left mastectomy without CT findings to suggest recurrent chest  wall tumor or axillary lymphadenopathy. 2. No findings to suggest metastatic disease involving the neck, chest, abdomen or pelvis or osseous structures. 3. Stable radiation changes involving the lingula.       09/19/2017 Imaging    Bone Scan Whole Body  IMPRESSION: No definite scintigraphic evidence of osseous metastatic disease. Scattered degenerative type uptake as above.       CURRENT THERAPY:  monthly Faslodex injection started on 06/07/17    INTERVAL HISTORY:   Gloria [name] returns for follow-up. She presents to the clinic today accompanied by her daughter.  She notes her left breast will feel tighter and tighter and cause palpitations. The chest pain (6/10) comes and goes for about 3-4 minutes. She does not take pain medication. She does take Tylenol for her sinus allergies. She notes if she does not take it she cannot sleep through the night. She notes she stopped breathing because of allergy reaction to contrast in 2012 so contrast was not given with last scan. She is not sure what the actual cause of the allergic reaction.  She notes the Faslodex injection will cause itching and will cause pain for about 3 weeks now compared to before. She took anti-itching cream to help. She notes occasional constipation and ensure boost takes care of it. She believes she was given a grant to cover her injections. She has been paying about $400 for each injection until she reaches her deductible.     PAST MEDICAL HISTORY: Past Medical History:  Diagnosis Date  . Anxiety   . Blood in urine   . CAD (coronary artery disease)    non obstructive by cath  . Cancer [facility])    breast- left  . Depression   . GERD (gastroesophageal reflux disease)   . Hypertension   . Knee fracture   . Personal history of radiation therapy 2017  . TR (tricuspid regurgitation)    mild by Echo 12/2008 EF >55%    PAST SURGICAL HISTORY: Past Surgical History:  Procedure Laterality Date  . ABDOMINAL AORTAGRAM N/A  09/08/2011   Procedure: ABDOMINAL Maxcine Ham;  Surgeon: Troy Sine, MD;  Location[facility] CATH LAB;  Service: Cardiovascular;  Laterality: N/A;  . ABDOMINAL HYSTERECTOMY    . BREAST EXCISIONAL BIOPSY Left 2017   X3  . BREAST SURGERY  08-30-10   mastectomy  . CARDIAC CATHETERIZATION  08/2011   20% LAD stenosis, 20% diagonal stenosis, 10-20% proximal dominant RCA stenosis  . COLON SURGERY    . LEFT HEART CATHETERIZATION WITH CORONARY ANGIOGRAM N/A 09/08/2011   Procedure: LEFT HEART CATHETERIZATION WITH CORONARY ANGIOGRAM;  Surgeon: Troy Sine, MD;  Location: [facility] CATH LAB;  Service: Cardiovascular;  Laterality: N/A;  . MASTECTOMY    . MINOR BREAST BIOPSY Left 11/25/2015   Procedure: MINOR EXCISION MASS LEFT CHEST WALL;  Surgeon: Autumn Messing III, MD;  Location: [place];  Service: General;  Laterality: Left;  . MINOR BREAST BIOPSY Left 07/17/2016   Procedure: EXCISION OF 2 LEFT CHEST WALL MASSES;  Surgeon: Autumn Messing III, MD;  Location: [place];  Service: General;  Laterality: Left;  . TONSILLECTOMY      FAMILY HISTORY: Family History  Problem Relation Age of Onset  . Cancer Father        prostate  . Cancer Sister        breast  . Heart disease Sister   . Breast cancer Sister   . Cancer Brother        pancreatic  . Cancer Maternal Grandmother        colon  . Dementia Mother   . Sudden death Brother   . Heart attack Brother   . Heart attack Brother   . Arthritis Sister   . Breast cancer Maternal Aunt     SOCIAL HISTORY: Social History   Tobacco Use  . Smoking status: Former Smoker    Last attempt to quit: 11/15/1991    Years since quitting: 25.8  . Smokeless tobacco: Never Used  Substance Use Topics  . Alcohol use: Yes    Alcohol/week: 1.2 oz    Types: 2 Glasses of wine per week    Comment: 1 glass per week  . Drug use: No    ALLERGIES: Allergies  Allergen Reactions  . Contrast Media [Iodinated Diagnostic Agents] Anaphylaxis     09/19/17-Pt premedicated. Then pt stated she was DNR. Spoke with [name] who did not realize allergy was recorded as anaphylactic. [name] verbally gave ok to change CT exam to without IV contrast. Pt and daughter do not recall occurrence of anaphylactic reaction to contrast media. Geanie Kenning, 3:27pm  . Penicillins Anaphylaxis and Swelling    Has patient had a PCN reaction causing immediate rash, facial/tongue/throat swelling, SOB or lightheadedness with hypotension: Yes Has patient had a PCN reaction causing severe rash involving mucus membranes or skin necrosis: No Has patient had a PCN reaction that required hospitalization Yes Has patient had a PCN reaction occurring within the last 10 years: No If all of the above answers are "NO", then may proceed with Cephalosporin use.   . Adhesive [Tape] Other (See Comments)    blisters  . Cephalexin Swelling  . Ciprofloxacin Swelling  . Demerol Nausea And Vomiting  . Sulfamethoxazole-Trimethoprim Itching and Swelling    REACTION: swelling/hives  . Quinolones Itching and Rash    REACTION: itching, rash Pt. Reports no problems with levaquin     MEDICATIONS:  Current Outpatient Medications  Medication Sig Dispense Refill  . acetaminophen (TYLENOL) 500 MG tablet Take 1,000 mg by mouth daily as needed for moderate pain. Reported on 03/10/2016    . ALPRAZolam (XANAX) 0.25 MG tablet Take 1/2 tablet in am and 1 tablet prior to bedtime. 45 tablet 2  . aspirin 81 MG chewable tablet Chew 81 mg by mouth once a week. Reported on 03/10/2016    . CALCIUM-MAGNESIUM-VITAMIN D PO Take 1 tablet by mouth 2 (two) times daily.    . cetirizine (ZYRTEC) 10 MG tablet Take 10 mg by mouth as needed for allergies.     . diphenhydrAMINE (BENADRYL) 50 MG tablet Take 1 tablet (50 mg total) by mouth as directed. Take Benadryl 50 mg po 1 hour before CT scan. 1 tablet 0  . doxepin (SINEQUAN) 10 MG capsule Take 10 mg by  mouth at bedtime.     . fluconazole (DIFLUCAN) 100 MG  tablet Take 1 tablet (100 mg total) by mouth daily. 1 tablet 0  . fluticasone (FLONASE) 50 MCG/ACT nasal spray Place 1 spray into both nostrils daily as needed for allergies.     Marland Kitchen guaiFENesin (MUCINEX) 600 MG 12 hr tablet Take 600 mg by mouth daily as needed for cough or to loosen phlegm. Reported on 03/28/2016    . ipratropium (ATROVENT) 0.06 % nasal spray Place 2 sprays into both nostrils 4 (four) times daily. 15 mL 0  . losartan (COZAAR) 25 MG tablet Take 25 mg by mouth daily.   0  . nitroGLYCERIN (NITROSTAT) 0.4 MG SL tablet Place 1 tablet (0.4 mg total) under the tongue every 5 (five) minutes as needed for chest pain. <PLEASE MAKE APPOINTMENT> (Patient not taking: Reported on 07/26/2017) 25 tablet 3  . potassium chloride (K-DUR) 10 MEQ tablet Take 10 mEq by mouth as needed.     . predniSONE (DELTASONE) 50 MG tablet Take Prednisone 50 mg po at 13 hours, 7 hours, and 1 hour before CT scan. 3 tablet 0  . pseudoephedrine-acetaminophen (TYLENOL SINUS) 30-500 MG TABS tablet Take 1 tablet by mouth every 4 (four) hours as needed.    . triamcinolone (KENALOG) 0.025 % cream Apply 1 application topically 2 (two) times daily. 30 g 0   No current facility-administered medications for this visit.     REVIEW OF SYSTEMS:  A 10 point review of systems was completed and is negative except as noted above.    PHYSICAL EXAMINATION:  BP 128/70 (BP Location: Right Arm, Patient Position: Sitting)   Pulse 77   Temp 98.2 F (36.8 C) (Oral)   Resp 18   Ht 5' 1"  (1.549 m)   Wt 130 lb 1.6 oz (59 kg)   SpO2 98%   BMI 24.58 kg/m   GENERAL: Patient is a well appearing female in no acute distress HEENT:  Sclerae anicteric.  Oropharynx clear and moist. No ulcerations or evidence of oropharyngeal candidiasis. Neck is supple.  NODES:   There is no cervical , supraclavicular, or axillary lymphadenopathy palpated.  LUNGS:  Clear to auscultation bilaterally.  No wheezes or rhonchi. HEART:  Regular rate and rhythm. No  murmur appreciated.  Chest:  ABDOMEN:  Soft, nontender.  Positive, normoactive bowel sounds. No organomegaly palpated.  MSK:  No focal spinal tenderness to palpation. Full range of motion bilaterally in the upper extremities. EXTREMITIES:  No peripheral edema.   SKIN:  Clear with no obvious rashes or skin changes.  NEURO:  Nonfocal. Well oriented.  Appropriate affect. BREAST EXAM:  Left breast is surgically absent,  incision in the left frontal chest has healed well.  Mild soft tissue fullness and edema around the incision, with skin retraction. Multiple 5-10 mm nodules along the incision line, slightly smaller than before, with tenderness on palpation. Right breast and bilateral axilla notable for no masses or nodules.  RECTUM: external and internal hemorrhoids, no palpable abnormalities. Non-tender.    ECOG PS:  1   LAB RESULTS: CBC Latest Ref Rng & Units 09/24/2017 08/30/2017 07/26/2017  WBC 3.9 - 10.3 10e3/uL 3.3(L) 4.3 3.9  Hemoglobin 11.6 - 15.9 g/dL 12.9 12.2 12.5  Hematocrit 34.8 - 46.6 % 39.6 36.7 37.8  Platelets 145 - 400 10e3/uL 133(L) 138(L) 129(L)      CMP Latest Ref Rng & Units 09/24/2017 08/30/2017 07/26/2017  Glucose 70 - 140 mg/dl 105 87 97  BUN 7.0 -  26.0 mg/dL 13.3 22.2 20.2  Creatinine 0.6 - 1.1 mg/dL 0.9 1.0 0.9  Sodium 136 - 145 mEq/L 142 141 143  Potassium 3.5 - 5.1 mEq/L 3.9 4.5 4.1  Chloride 101 - 111 mmol/L - - -  CO2 22 - 29 mEq/L 26 30(H) 28  Calcium 8.4 - 10.4 mg/dL 9.2 9.2 9.4  Total Protein 6.4 - 8.3 g/dL 6.2(L) 6.3(L) 6.4  Total Bilirubin 0.20 - 1.20 mg/dL 0.44 0.47 0.40  Alkaline Phos 40 - 150 U/L 57 53 64  AST 5 - 34 U/L 18 22 23   ALT 0 - 55 U/L 13 12 10    CA antigen 27.29 on 03/21/17 was 32.7  PATHOLOGY REPORT Diagnosis 11/25/2015 Soft tissue mass, simple excision, Left chest wall - INVASIVE LOBULAR CARCINOMA, PRESENT AT NON ORIENTED TISSUE EDGE(S). - SEE COMMENT. Microscopic Comment The carcinoma appears grade 2. A breast prognostic profile  will be performed and the results reported separately. (JDP:gt, 11/26/15).  Results: IMMUNOHISTOCHEMICAL AND MORPHOMETRIC ANALYSIS PERFORMED MANUALLY Estrogen Receptor: 95%, POSITIVE, STRONG STAINING INTENSITY Progesterone Receptor: 60%, POSITIVE, STRONG STAINING INTENSITY Results: HER2 - NEGATIVE RATIO OF HER2/CEP17 SIGNALS 1.07 AVERAGE HER2 COPY NUMBER PER CELL 2.90  Diagnosis 07/17/2016 1. Soft tissue mass, simple excision, Chest wall medial left - INVASIVE LOBULAR CARCINOMA, SEE COMMENT. - NODULE OF FIBROSIS AND CALCIFICATION. 2. Soft tissue mass, simple excision, Chest wall lateral left - INVASIVE LOBULAR CARCINOMA, SEE COMMENT. Microscopic Comment 1. -2. Both parts reveal foci of invasive lobular carcinoma. There is only limited tumor in part #1. Tumor does extend to the uninked edges of both parts. The carcinoma appears grade 2. A prognostic panel has been ordered on part #2.  2. PROGNOSTIC INDICATORS Results: IMMUNOHISTOCHEMICAL AND MORPHOMETRIC ANALYSIS PERFORMED MANUALLY Estrogen Receptor: 60%, POSITIVE, STRONG STAINING INTENSITY Progesterone Receptor: 90%, POSITIVE, STRONG STAINING INTENSITY Proliferation Marker Ki67: 10%  Results: HER2 - NEGATIVE RATIO OF HER2/CEP17 SIGNALS 1.20 AVERAGE HER2 COPY NUMBER PER CELL 2.40   RADIOGRAPHIC STUDIES:   Bone Scan Whole Body 09/19/17  IMPRESSION: No definite scintigraphic evidence of osseous metastatic disease. Scattered degenerative type uptake as above.   CT CAP WO Contrast 09/19/17 IMPRESSION: 1. Surgical changes from a left mastectomy without CT findings to suggest recurrent chest wall tumor or axillary lymphadenopathy. 2. No findings to suggest metastatic disease involving the neck, chest, abdomen or pelvis or osseous structures. 3. Stable radiation changes involving the lingula.    PET 04/10/17 IMPRESSION: 1. Stable exam.  No findings of recurrent malignancy. 2. Changes of external beam radiation again  noted within the lingula. 3. Small left axillary lymph node exhibits mild increased uptake. Unchanged from previous exam.   Mammogram 03/01/2017 IMPRESSION: No mammographic evidence of malignancy. A result letter of this screening mammogram will be mailed directly to the patient. RECOMMENDATION: Screening mammogram in one year   CT chest, abdomen and pelvis wo contrast  07/27/2016 IMPRESSION: Sigmoid diverticulosis without acute diverticulitis. No bowel obstruction or acute inflammation. Degenerative disc disease and facet arthropathy of the lower lumbar spine with slight grade 1 anterolisthesis of L4 on L5. Stable appearing simple left-sided renal cysts.  PET 07/14/2016 IMPRESSION: 1. No findings of recurrent malignancy. 2. Radiation fibrosis in the lingula. A small left axillary lymph node is not hypermetabolic and only 6 mm in diameter. This is in the vicinity of the prior axillary cyst. 3. Pelvic floor laxity.  ASSESSMENT/PLAN: 83 y.o. female with: invasive lobular carcinoma of the left breast   1. Cancer of the central portion of left female  breast, Stage IIB, T2 N1 invasive lobular carcinoma, grade 2, ER 95%, PR 97%, Ki-67 17%, HER-2/neu no amplification, diagnosed in 07/2010, local chest wall recurrence in 09/2015   -She was on adjuvant tamoxifen for 4.5 years, stopped on her own. -She unfortunately developed local recurrence 8 months after she stopped tamoxifen. -I previously reviewed her surgical pathology findings, which is consistent with lobular carcinoma recurrence from her previous breast cancer. -The tumor was surgically removed, with positive margins. -Her restaging CT scan and bone scan in 12/2015 was negative for distant metastasis. -She has completed chest wall radiation, however developed 2-3 small nodules during the radiation and anastrozole therapy, suspicious for residual cancer. US showed a 4 mm and 6 mm subcutaneous nodule, partially calcified, highly  suspicious for local recurrence. -She subsequently underwent a second chest wall surgery for two soft tissue resection, I previously reviewed her pathology results, both showed invasive lobular carcinoma, margins were positive. I spoke with Dr. Marlou Starks, no more surgery will be offered, because complete surgical resection is unlikely. Patient is also not in favor of more extensive surgery. - We previously discussed her that her cancer is unlikely curable at this stage, and our goal of therapy is palliative for disease control, and prolong her life. She voiced good understanding. - I reviewed her restaging PET scan from 04/10/2017, which was negative for metastatic disease - She has developed more chest wall tenderness, the tumor nodules along the incision signs are slightly bigger and tender, I think she has had some disease progression. I recommend her to stop tamoxifen, and changed to fulvestrant injection on 06/07/17 -She has been tolerating Faslodex injections well overall, with some pain at injection sites  -Due to possible anaphylactic reaction, she will not receive contrast with scans.  -We discussed her CT CAP wo contrast and bone scan from 09/19/17 which shows no bone metastasis and no cancer spread beyond chest wall.  Of note, due to limitation of noncontrast CT, small metastasis is not ruled out completely. -09/24/17 Labs reviewed and adequate to continue with Faslodex injection monthly -F/u in 2 months   2. HTN, CAD  - She'll continue follow-up with her primary care physician and cardiologist  3. Left shoulder blade pain  - Chronic, intermittent, mild, for the past year. Prior bone scan was negative - Possible muscular related, we'll continue monitoring -resolved   4. Osteoporosis  - her last DEXA scan in 06/2012 showed osteoporosis  - I previously encouraged her to continue calcium and Vit D - I previously encouraged her to repeat DEXA at her PCP office   5. Chest Pain - Secondary to  the small tumor nodules on the chest wall. - Manageable, she cannot take much pain medication.  - Since she has been having continued pain, I did offer her Gabapentin; however, she declined this for now. I encouraged her to take Tylenol and Ibuprofen for further management. I also encouraged her to keep her fluid intake up while taking these medications.  -I advised her to regularly stretch her left arm to help with ROM and tightening of her incision location.   6. Financial stress  -She has about $400 co-pay for fulvestrant injection,I spoke with our financial specialist. She does not have co-pay assistant for now -she received $1000 for personal expense from [employer]   7. Goal of care discussion  -We again discussed the incurable nature of her cancer, and the overall poor prognosis, especially if he/she does not have good response to chemotherapy or  progress on chemo -The patient understands the goal of care is palliative. -I recommend DNR/DNI, she will think about it   Plan -lab and scan reviewed  -continue fulvestrant injection monthly, schedule Injection in 4 and 8 weeks  -Lab and f/u in 8 weeks    I spent 20 minutes counseling the patient face to face.  The total time spent in the appointment was 30 minutes.  This document serves as a record of services personally performed by Truitt Merle, MD. It was created on her behalf by Joslyn Devon, a trained medical scribe. The creation of this record is based on the scribe's personal observations and the provider's statements to them.    I have reviewed the above documentation for accuracy and completeness, and I agree with the above.    Truitt Merle  09/27/2017

## 2017-09-27 ENCOUNTER — Telehealth: Payer: Self-pay | Admitting: Hematology

## 2017-09-27 ENCOUNTER — Encounter: Payer: Self-pay | Admitting: Hematology

## 2017-09-27 ENCOUNTER — Ambulatory Visit (HOSPITAL_BASED_OUTPATIENT_CLINIC_OR_DEPARTMENT_OTHER): Payer: PPO

## 2017-09-27 ENCOUNTER — Ambulatory Visit (HOSPITAL_BASED_OUTPATIENT_CLINIC_OR_DEPARTMENT_OTHER): Payer: PPO | Admitting: Hematology

## 2017-09-27 VITALS — BP 128/70 | HR 77 | Temp 98.2°F | Resp 18 | Ht 61.0 in | Wt 130.1 lb

## 2017-09-27 DIAGNOSIS — C50112 Malignant neoplasm of central portion of left female breast: Secondary | ICD-10-CM

## 2017-09-27 DIAGNOSIS — M810 Age-related osteoporosis without current pathological fracture: Secondary | ICD-10-CM

## 2017-09-27 DIAGNOSIS — I2510 Atherosclerotic heart disease of native coronary artery without angina pectoris: Secondary | ICD-10-CM

## 2017-09-27 DIAGNOSIS — I10 Essential (primary) hypertension: Secondary | ICD-10-CM

## 2017-09-27 DIAGNOSIS — G893 Neoplasm related pain (acute) (chronic): Secondary | ICD-10-CM

## 2017-09-27 DIAGNOSIS — Z7189 Other specified counseling: Secondary | ICD-10-CM

## 2017-09-27 DIAGNOSIS — Z5111 Encounter for antineoplastic chemotherapy: Secondary | ICD-10-CM | POA: Diagnosis not present

## 2017-09-27 DIAGNOSIS — Z170 Estrogen receptor positive status [ER+]: Principal | ICD-10-CM

## 2017-09-27 DIAGNOSIS — C44501 Unspecified malignant neoplasm of skin of breast: Secondary | ICD-10-CM

## 2017-09-27 MED ORDER — FULVESTRANT 250 MG/5ML IM SOLN
500.0000 mg | INTRAMUSCULAR | Status: DC
  Administered 2017-09-27: 500 mg via INTRAMUSCULAR
  Filled 2017-09-27: qty 10

## 2017-09-27 NOTE — Telephone Encounter (Signed)
Gave avs and calendar for for December and January 2019

## 2017-09-27 NOTE — Patient Instructions (Signed)

## 2017-10-04 ENCOUNTER — Encounter: Payer: Self-pay | Admitting: General Practice

## 2017-10-04 NOTE — Progress Notes (Signed)
[place] CSW Progress Note  Pt no showed for scheduled appt w CSW to complete [employer] [place] application.  CSW will attempt to contact and reschedule.  Edwyna Shell, LCSW Clinical Social Worker Phone:  [phone]

## 2017-10-11 ENCOUNTER — Encounter: Payer: Self-pay | Admitting: General Practice

## 2017-10-11 NOTE — Progress Notes (Signed)
[place] CSW Progress Note  Appointment scheduled w patient for 12/31 at 2 PM to complete [facility] paperwork, packet mailed to patient w paperwork she will need to supply to support application.  Edwyna Shell, LCSW Clinical Social Worker Phone:  [phone]

## 2017-10-17 ENCOUNTER — Encounter: Payer: Self-pay | Admitting: General Practice

## 2017-10-17 NOTE — Progress Notes (Signed)
[place] CSW Progress Note  Appointment to complete [place] [employer] [place] application w patient rescheduled for Jan 2nd at 10:30 AM.  Patient aware and agreeable.  Edwyna Shell, LCSW Clinical Social Worker Phone:  [phone]

## 2017-10-25 ENCOUNTER — Ambulatory Visit (HOSPITAL_BASED_OUTPATIENT_CLINIC_OR_DEPARTMENT_OTHER): Payer: PPO

## 2017-10-25 VITALS — BP 140/86 | HR 74 | Temp 98.0°F | Resp 20

## 2017-10-25 DIAGNOSIS — C50112 Malignant neoplasm of central portion of left female breast: Secondary | ICD-10-CM

## 2017-10-25 DIAGNOSIS — Z5111 Encounter for antineoplastic chemotherapy: Secondary | ICD-10-CM

## 2017-10-25 DIAGNOSIS — C44501 Unspecified malignant neoplasm of skin of breast: Secondary | ICD-10-CM

## 2017-10-25 DIAGNOSIS — Z170 Estrogen receptor positive status [ER+]: Principal | ICD-10-CM

## 2017-10-25 MED ORDER — FULVESTRANT 250 MG/5ML IM SOLN
500.0000 mg | INTRAMUSCULAR | Status: DC
  Administered 2017-10-25: 500 mg via INTRAMUSCULAR

## 2017-10-25 NOTE — Patient Instructions (Signed)

## 2017-11-01 ENCOUNTER — Other Ambulatory Visit: Payer: Self-pay | Admitting: Hematology

## 2017-11-02 ENCOUNTER — Other Ambulatory Visit: Payer: Self-pay | Admitting: *Deleted

## 2017-11-02 ENCOUNTER — Encounter: Payer: Self-pay | Admitting: General Practice

## 2017-11-02 MED ORDER — ALPRAZOLAM 0.25 MG PO TABS: ORAL_TABLET | ORAL | 2 refills | Status: DC

## 2017-11-02 NOTE — Progress Notes (Addendum)
[place] CSW Progress Note  Per Estate manager/land agent L White, pt was just approved for patient assistance funds to cover cost of current medications/chemotherapy.  Patient does not want to proceed w submission of [place] application as costs are now covered.   Edwyna Shell, LCSW Clinical Social Worker Phone:  [phone]

## 2017-11-05 ENCOUNTER — Encounter: Payer: Self-pay | Admitting: Hematology

## 2017-11-05 NOTE — Progress Notes (Signed)
The Patient [place] has funding available for pt's Dx so I completed the application online.  She was approved for $4,000 for 12 months from 10/31/17 with a 6 month look back period.  $2,500 is her standard award, $1,500 is accessible on a [employer] basis as long as funding remains available. Emailed Community education officer and POE to Ledora Bottcher and [place] in billing and to HIM to scan in pt's chart.

## 2017-11-20 NOTE — Progress Notes (Signed)
[place]  [phone] Fax:(336) [phone]  OFFICE PROGRESS NOTE  PATIENT: Gloria [name]   DOB: 07/18/1928  MR#: [id number]  YJE#:[id number]  OV:ZCHYIFOYD, [name], MD [name], MD [name]. [name], MD  Date of Service:  11/22/2017    DIAGNOSIS:  An 84 y.o.  woman with invasive lobular carcinoma of the left breast diagnosed in 07/2010, chest wall local recurrence in 09/2015 .  Oncology History   Cancer of central portion of left female breast [facility])   Staging form: Breast, AJCC 7th Edition     Pathologic stage from 08/04/2010: Stage IIB (T2, N1a, cM0) - Signed by Truitt Merle, MD on 12/09/2015       Cancer of central portion of left female breast ([place])   08/03/2010 Mammogram     left nipple retraction, and the palpable mass subareolar left breast at the 6:00 position. Ultrasound confirmed the presence of a mass measuring 2.6 x 1.7 x 2.4 cm      08/04/2010 Initial Biopsy    left breast mass biopsy showed an invasive mammary carcinoma with lobular features. ER 95%, PR 97%, Ki-67 17%, HER-2/neu (-)      08/04/2010 Pathologic Stage    Stage IIB: T2 N1a      08/11/2010 Imaging    left breast mass 4.6 x 4.2 x 2.3 cm with enhancement distortion extending to the nipple retraction. There is questionable anterior mediastinal lymph node seen      07/2010 - 09/03/2010 Anti-estrogen oral therapy    neoadjuvant letrozole       09/03/2010 Surgery     left breast mastectomy with sentinel node biopsy on 09/03/2010.      08/2010 - 01/2015 Anti-estrogen oral therapy    Tamoxifen, pt stopped on her own       12/03/2010 - 01/23/2011 Radiation Therapy    left chest wall and axilla radiaiton       10/19/2015 Progression    Left chest wall recurrence, s/p resection on 11/25/2015 with positive margins ([facility])       12/10/2015 - 05/31/2017 Anti-estrogen oral therapy    anastrozole 64m daily, which was switched to Tamoxifen on 06/30/2016 due to persistent disease.       01/03/2016 - 03/03/2016 Radiation Therapy    Radiation to left chest wall recurrence (12/06/2015-02/04/2016 - 44 Gy), pt progressed through the radiation, and had additional 10 fractions of radiation (02/21/16-03/03/2016 - total 70 Gy to the left chest wall)      07/14/2016 PET scan    PET 07/14/2016 IMPRESSION: 1. No findings of recurrent malignancy. 2. Radiation fibrosis in the lingula. A small left axillary lymph node is not hypermetabolic and only 6 mm in diameter. This is in the vicinity of the prior axillary cyst. 3. Pelvic floor laxity.      07/17/2016 Surgery    Chest wall soft tissue resection for local recurrence      07/17/2016 Pathology Results    Left chest wall two soft tissue resection both showed invasive lobular carcinoma, margins were positive. ER 95% positive, PR 60% positive, HER-2 negative.      07/27/2016 Imaging    CT chest, abdomen and pelvis wo contrast  07/27/2016 IMPRESSION: Sigmoid diverticulosis without acute diverticulitis. No bowel obstruction or acute inflammation. Degenerative disc disease and facet arthropathy of the lower lumbar spine with slight grade 1 anterolisthesis of L4 on L5. Stable appearing simple left-sided renal cysts.      03/01/2017 Mammogram    Mammogram 03/01/2017  IMPRESSION: No mammographic evidence of malignancy. A result letter of this screening mammogram will be mailed directly to the patient. RECOMMENDATION: Screening mammogram in one year      04/10/2017 Imaging    PET IMPRESSION: 1. Stable exam.  No findings of recurrent malignancy. 2. Changes of external beam radiation again noted within the lingula. 3. Small left axillary lymph node exhibits mild increased uptake. Unchanged from previous exam.      06/07/2017 -  Anti-estrogen oral therapy    Stopped Tamoxifen and Began Faslodex IM injection on 06/07/17       09/19/2017 Imaging    CT CAP WO Contrast 09/19/17 IMPRESSION: 1. Surgical changes from a left mastectomy without CT  findings to suggest recurrent chest wall tumor or axillary lymphadenopathy. 2. No findings to suggest metastatic disease involving the neck, chest, abdomen or pelvis or osseous structures. 3. Stable radiation changes involving the lingula.       09/19/2017 Imaging    Bone Scan Whole Body  IMPRESSION: No definite scintigraphic evidence of osseous metastatic disease. Scattered degenerative type uptake as above.       CURRENT THERAPY:  monthly Faslodex injection started on 06/07/17    INTERVAL HISTORY:   [name] returns for follow-up. She presents to the clinic today asking if she can take Levaquin for her sickness while on Faslodex. She went to ENT for this treatment before when she had a sinus infection which she thinks she has again. She notes she was given more information on her allergy to CT contrast. She had an allergic reaction when she had catheterization. She asked to have her daughter called about today's visit.   On review of symptoms, pt notes her temperature is slightly elevated than normal and she feels congested. She has been congested for 2 months now. She has chills 1 week ago. Last night she was hot in the night. She notes to also having facial tenderness from sinus congestions. She notes having a light dry cough.    PAST MEDICAL HISTORY: Past Medical History:  Diagnosis Date  . Anxiety   . Blood in urine   . CAD (coronary artery disease)    non obstructive by cath  . Cancer [facility])    breast- left  . Depression   . GERD (gastroesophageal reflux disease)   . Hypertension   . Knee fracture   . Personal history of radiation therapy 2017  . TR (tricuspid regurgitation)    mild by Echo 12/2008 EF >55%    PAST SURGICAL HISTORY: Past Surgical History:  Procedure Laterality Date  . ABDOMINAL AORTAGRAM N/A 09/08/2011   Procedure: ABDOMINAL Maxcine Ham;  Surgeon: Troy Sine, MD;  Location: [facility] CATH LAB;  Service: Cardiovascular;  Laterality: N/A;  . ABDOMINAL  HYSTERECTOMY    . BREAST EXCISIONAL BIOPSY Left 2017   X3  . BREAST SURGERY  08-30-10   mastectomy  . CARDIAC CATHETERIZATION  08/2011   20% LAD stenosis, 20% diagonal stenosis, 10-20% proximal dominant RCA stenosis  . COLON SURGERY    . LEFT HEART CATHETERIZATION WITH CORONARY ANGIOGRAM N/A 09/08/2011   Procedure: LEFT HEART CATHETERIZATION WITH CORONARY ANGIOGRAM;  Surgeon: Troy Sine, MD;  Location: [facility] CATH LAB;  Service: Cardiovascular;  Laterality: N/A;  . MASTECTOMY    . MINOR BREAST BIOPSY Left 11/25/2015   Procedure: MINOR EXCISION MASS LEFT CHEST WALL;  Surgeon: Autumn Messing III, MD;  Location: [place];  Service: General;  Laterality: Left;  . MINOR BREAST BIOPSY Left  07/17/2016   Procedure: EXCISION OF 2 LEFT CHEST WALL MASSES;  Surgeon: Autumn Messing III, MD;  Location: [place];  Service: General;  Laterality: Left;  . TONSILLECTOMY      FAMILY HISTORY: Family History  Problem Relation Age of Onset  . Cancer Father        prostate  . Cancer Sister        breast  . Heart disease Sister   . Breast cancer Sister   . Cancer Brother        pancreatic  . Cancer Maternal Grandmother        colon  . Dementia Mother   . Sudden death Brother   . Heart attack Brother   . Heart attack Brother   . Arthritis Sister   . Breast cancer Maternal Aunt     SOCIAL HISTORY: Social History   Tobacco Use  . Smoking status: Former Smoker    Last attempt to quit: 11/15/1991    Years since quitting: 26.0  . Smokeless tobacco: Never Used  Substance Use Topics  . Alcohol use: Yes    Alcohol/week: 1.2 oz    Types: 2 Glasses of wine per week    Comment: 1 glass per week  . Drug use: No    ALLERGIES: Allergies  Allergen Reactions  . Contrast Media [Iodinated Diagnostic Agents] Anaphylaxis    09/19/17-Pt premedicated. Then pt stated she was DNR. Spoke with [name] who did not realize allergy was recorded as anaphylactic. [name] verbally gave ok to  change CT exam to without IV contrast. Pt and daughter do not recall occurrence of anaphylactic reaction to contrast media. Geanie Kenning, 3:27pm  . Penicillins Anaphylaxis and Swelling    Has patient had a PCN reaction causing immediate rash, facial/tongue/throat swelling, SOB or lightheadedness with hypotension: Yes Has patient had a PCN reaction causing severe rash involving mucus membranes or skin necrosis: No Has patient had a PCN reaction that required hospitalization Yes Has patient had a PCN reaction occurring within the last 10 years: No If all of the above answers are "NO", then may proceed with Cephalosporin use.   . Adhesive [Tape] Other (See Comments)    blisters  . Cephalexin Swelling  . Ciprofloxacin Swelling  . Demerol Nausea And Vomiting  . Sulfamethoxazole-Trimethoprim Itching and Swelling    REACTION: swelling/hives  . Quinolones Itching and Rash    REACTION: itching, rash Pt. Reports no problems with levaquin     MEDICATIONS:  Current Outpatient Medications  Medication Sig Dispense Refill  . acetaminophen (TYLENOL) 500 MG tablet Take 1,000 mg by mouth daily as needed for moderate pain. Reported on 03/10/2016    . ALPRAZolam (XANAX) 0.25 MG tablet Take 1/2 tablet in am and 1 tablet prior to bedtime. 45 tablet 2  . aspirin 81 MG chewable tablet Chew 81 mg by mouth once a week. Reported on 03/10/2016    . CALCIUM-MAGNESIUM-VITAMIN D PO Take 1 tablet by mouth 2 (two) times daily.    . cetirizine (ZYRTEC) 10 MG tablet Take 10 mg by mouth as needed for allergies.     . diphenhydrAMINE (BENADRYL) 50 MG tablet Take 1 tablet (50 mg total) by mouth as directed. Take Benadryl 50 mg po 1 hour before CT scan. 1 tablet 0  . doxepin (SINEQUAN) 10 MG capsule Take 10 mg by mouth at bedtime.     . fluconazole (DIFLUCAN) 100 MG tablet Take 1 tablet (100 mg total) by mouth daily. 1  tablet 0  . fluticasone (FLONASE) 50 MCG/ACT nasal spray Place 1 spray into both nostrils daily as needed  for allergies.     Marland Kitchen guaiFENesin (MUCINEX) 600 MG 12 hr tablet Take 600 mg by mouth daily as needed for cough or to loosen phlegm. Reported on 03/28/2016    . ipratropium (ATROVENT) 0.06 % nasal spray Place 2 sprays into both nostrils 4 (four) times daily. 15 mL 0  . levofloxacin (LEVAQUIN) 500 MG tablet Take 1 tablet (500 mg total) by mouth daily. 7 tablet 0  . losartan (COZAAR) 25 MG tablet Take 25 mg by mouth daily.   0  . nitroGLYCERIN (NITROSTAT) 0.4 MG SL tablet Place 1 tablet (0.4 mg total) under the tongue every 5 (five) minutes as needed for chest pain. <PLEASE MAKE APPOINTMENT> (Patient not taking: Reported on 07/26/2017) 25 tablet 3  . potassium chloride (K-DUR) 10 MEQ tablet Take 10 mEq by mouth as needed.     . predniSONE (DELTASONE) 50 MG tablet Take Prednisone 50 mg po at 13 hours, 7 hours, and 1 hour before CT scan. 3 tablet 0  . pseudoephedrine-acetaminophen (TYLENOL SINUS) 30-500 MG TABS tablet Take 1 tablet by mouth every 4 (four) hours as needed.    . triamcinolone (KENALOG) 0.025 % cream Apply 1 application topically 2 (two) times daily. 30 g 0   No current facility-administered medications for this visit.     REVIEW OF SYSTEMS:  A 10 point review of systems was completed and is negative except as noted above.    PHYSICAL EXAMINATION:  BP (!) 153/83 (BP Location: Right Arm, Patient Position: Sitting) Comment: Dr.Isadora Delorey is aware of the bp.  Pulse 71   Temp 98.7 F (37.1 C) (Oral)   Resp 16   Ht 5' 1"  (1.549 m)   Wt 133 lb 11.2 oz (60.6 kg)   SpO2 98%   BMI 25.26 kg/m     GENERAL: Patient is a well appearing female in no acute distress HEENT:  Sclerae anicteric.  Oropharynx clear and moist. No ulcerations or evidence of oropharyngeal candidiasis. Neck is supple.  NODES:   There is no cervical , supraclavicular, or axillary lymphadenopathy palpated.  LUNGS:  Clear to auscultation bilaterally.  No wheezes or rhonchi. HEART:  Regular rate and rhythm. No murmur  appreciated.  Chest:  ABDOMEN:  Soft, nontender.  Positive, normoactive bowel sounds. No organomegaly palpated.  MSK:  No focal spinal tenderness to palpation. Full range of motion bilaterally in the upper extremities. EXTREMITIES:  No peripheral edema.   SKIN:  Clear with no obvious rashes or skin changes.  NEURO:  Nonfocal. Well oriented.  Appropriate affect. BREAST EXAM: Left breast is surgically absent,  incision in the left frontal chest has healed well.  Mild soft tissue fullness and edema around the incision, with skin retraction. Multiple 5-10 mm nodules along the incision line, overall stable, with tenderness on palpation. Right breast and bilateral axilla notable for no masses or nodules.  RECTUM: external and internal hemorrhoids, no palpable abnormalities. Non-tender.    ECOG PS:  1   LAB RESULTS: CBC Latest Ref Rng & Units 11/22/2017 09/24/2017 08/30/2017  WBC 3.9 - 10.3 K/uL 3.5(L) 3.3(L) 4.3  Hemoglobin 11.6 - 15.9 g/dL 13.2 12.9 12.2  Hematocrit 34.8 - 46.6 % 40.8 39.6 36.7  Platelets 145 - 400 K/uL 133(L) 133(L) 138(L)      CMP Latest Ref Rng & Units 11/22/2017 09/24/2017 08/30/2017  Glucose 70 - 140 mg/dL 70 105 87  BUN 7 - 26 mg/dL 12 13.3 22.2  Creatinine 0.60 - 1.10 mg/dL 0.85 0.9 1.0  Sodium 136 - 145 mmol/L 143 142 141  Potassium 3.3 - 4.7 mmol/L 4.3 3.9 4.5  Chloride 98 - 109 mmol/L 107 - -  CO2 22 - 29 mmol/L 28 26 30(H)  Calcium 8.4 - 10.4 mg/dL 9.4 9.2 9.2  Total Protein 6.4 - 8.3 g/dL 6.4 6.2(L) 6.3(L)  Total Bilirubin 0.2 - 1.2 mg/dL 0.5 0.44 0.47  Alkaline Phos 40 - 150 U/L 74 57 53  AST 5 - 34 U/L 22 18 22   ALT 0 - 55 U/L 13 13 12    CA antigen 27.29 on 03/21/17 was 32.7  PATHOLOGY REPORT Diagnosis 11/25/2015 Soft tissue mass, simple excision, Left chest wall - INVASIVE LOBULAR CARCINOMA, PRESENT AT NON ORIENTED TISSUE EDGE(S). - SEE COMMENT. Microscopic Comment The carcinoma appears grade 2. A breast prognostic profile will be performed and the  results reported separately. (JDP:gt, 11/26/15).  Results: IMMUNOHISTOCHEMICAL AND MORPHOMETRIC ANALYSIS PERFORMED MANUALLY Estrogen Receptor: 95%, POSITIVE, STRONG STAINING INTENSITY Progesterone Receptor: 60%, POSITIVE, STRONG STAINING INTENSITY Results: HER2 - NEGATIVE RATIO OF HER2/CEP17 SIGNALS 1.07 AVERAGE HER2 COPY NUMBER PER CELL 2.90  Diagnosis 07/17/2016 1. Soft tissue mass, simple excision, Chest wall medial left - INVASIVE LOBULAR CARCINOMA, SEE COMMENT. - NODULE OF FIBROSIS AND CALCIFICATION. 2. Soft tissue mass, simple excision, Chest wall lateral left - INVASIVE LOBULAR CARCINOMA, SEE COMMENT. Microscopic Comment 1. -2. Both parts reveal foci of invasive lobular carcinoma. There is only limited tumor in part #1. Tumor does extend to the uninked edges of both parts. The carcinoma appears grade 2. A prognostic panel has been ordered on part #2.  2. PROGNOSTIC INDICATORS Results: IMMUNOHISTOCHEMICAL AND MORPHOMETRIC ANALYSIS PERFORMED MANUALLY Estrogen Receptor: 60%, POSITIVE, STRONG STAINING INTENSITY Progesterone Receptor: 90%, POSITIVE, STRONG STAINING INTENSITY Proliferation Marker Ki67: 10%  Results: HER2 - NEGATIVE RATIO OF HER2/CEP17 SIGNALS 1.20 AVERAGE HER2 COPY NUMBER PER CELL 2.40   RADIOGRAPHIC STUDIES:   Bone Scan Whole Body 09/19/17  IMPRESSION: No definite scintigraphic evidence of osseous metastatic disease. Scattered degenerative type uptake as above.   CT CAP WO Contrast 09/19/17 IMPRESSION: 1. Surgical changes from a left mastectomy without CT findings to suggest recurrent chest wall tumor or axillary lymphadenopathy. 2. No findings to suggest metastatic disease involving the neck, chest, abdomen or pelvis or osseous structures. 3. Stable radiation changes involving the lingula.    PET 04/10/17 IMPRESSION: 1. Stable exam.  No findings of recurrent malignancy. 2. Changes of external beam radiation again noted within the lingula. 3.  Small left axillary lymph node exhibits mild increased uptake. Unchanged from previous exam.   Mammogram 03/01/2017 IMPRESSION: No mammographic evidence of malignancy. A result letter of this screening mammogram will be mailed directly to the patient. RECOMMENDATION: Screening mammogram in one year   CT chest, abdomen and pelvis wo contrast  07/27/2016 IMPRESSION: Sigmoid diverticulosis without acute diverticulitis. No bowel obstruction or acute inflammation. Degenerative disc disease and facet arthropathy of the lower lumbar spine with slight grade 1 anterolisthesis of L4 on L5. Stable appearing simple left-sided renal cysts.  PET 07/14/2016 IMPRESSION: 1. No findings of recurrent malignancy. 2. Radiation fibrosis in the lingula. A small left axillary lymph node is not hypermetabolic and only 6 mm in diameter. This is in the vicinity of the prior axillary cyst. 3. Pelvic floor laxity.  ASSESSMENT/PLAN: 84 y.o. female with: invasive lobular carcinoma of the left breast   1. Cancer of the central portion  of left female breast, Stage IIB, T2 N1 invasive lobular carcinoma, grade 2, ER 95%, PR 97%, Ki-67 17%, HER-2/neu no amplification, diagnosed in 07/2010, local chest wall recurrence in 09/2015   -She was on adjuvant tamoxifen for 4.5 years, stopped on her own. -She unfortunately developed local recurrence 8 months after she stopped tamoxifen. -I previously reviewed her surgical pathology findings, which is consistent with lobular carcinoma recurrence from her previous breast cancer. -The tumor was surgically removed, with positive margins. -Her restaging CT scan and bone scan in 12/2015 was negative for distant metastasis. -She has completed chest wall radiation, however developed 2-3 small nodules during the radiation and anastrozole therapy, suspicious for residual cancer. US showed a 4 mm and 6 mm subcutaneous nodule, partially calcified, highly suspicious for local  recurrence. -She subsequently underwent a second chest wall surgery for two soft tissue resection, I previously reviewed her pathology results, both showed invasive lobular carcinoma, margins were positive. I spoke with Dr. Marlou Starks, no more surgery will be offered, because complete surgical resection is unlikely. Patient is also not in favor of more extensive surgery. - We previously discussed her that her cancer is unlikely curable at this stage, and our goal of therapy is palliative for disease control, and prolong her life. She voiced good understanding. - I reviewed her restaging PET scan from 04/10/2017, which was negative for metastatic disease - She has developed more chest wall tenderness, the tumor nodules along the incision line are slightly bigger and tender, I think she has had some disease progression. I have switched her tamoxifen to fulvestrant injection on 06/07/17 -She has been tolerating Faslodex injections well overall, with some pain at injection sites  -Due to possible anaphylactic reaction, she will not receive CT contrast with scans.  -We discussed her CT CAP wo contrast and bone scan from 09/19/17 which shows no bone metastasis and no cancer spread beyond chest wall.  -11/22/17 Labs reviewed and adequate to continue with Faslodex injection monthly -she is clinically do well overall, still has mild pain at the left chest wall, exam today showed a stable nodules around her incision line, otherwise negative.  No clinical concern for disease progression. -F/u in 2 months and if doing well will see her every 3 months afterwards.  -plan to repeat CT or PET scan in May 2019   2. HTN, CAD  - She'll continue follow-up with her primary care physician and cardiologist  3. Osteoporosis  - her last DEXA scan in 06/2012 showed osteoporosis  - I previously encouraged her to continue calcium and Vit D - I previously encouraged her to repeat DEXA at her PCP office   4. Chest Pain  - Secondary  to the small tumor nodules on the chest wall. - Manageable, she cannot take much pain medication.  - Since she has been having continued pain, I did offer her Gabapentin; however, she declined this for now. I encouraged her to take Tylenol and Ibuprofen for further management. I also encouraged her to keep her fluid intake up while taking these medications.  -I advised her to regularly stretch her left arm to help with ROM and tightening of her incision location.  -Her pain is manageable, her breast nodule is stable.   5. Goal of care discussion  -We again discussed the incurable nature of her cancer, and the overall poor prognosis, especially if he/she does not have good response to chemotherapy or progress on chemo -The patient understands the goal of care is palliative. -  I recommend DNR/DNI, she will think about it   6. Sinus infection  -Has had sinus congestion for the past 2 months with an episode of chills, hot flash and low grade fever yesterday   -She was previously treated with Levaquin for a prior sinus infection by ENT. She has tenderness on palpitation of sinus area today  -I prescribe Levaquin 540m today for 7 day dose.    Plan -Prescribe Levaquin 7 day dose  -Labs reviewed and adequate to get fulvestrant injection today and continue monthly -Lab and injection in 4 and 8 weeks -F/u in 8 weeks  -I called her daughter SLovey Newcomerand updated her    I spent 20 minutes counseling the patient face to face.  The total time spent in the appointment was 30 minutes.  This document serves as a record of services personally performed by YTruitt Merle MD. It was created on her behalf by AJoslyn Devon a trained medical scribe. The creation of this record is based on the scribe's personal observations and the provider's statements to them.    I have reviewed the above documentation for accuracy and completeness, and I agree with the above.   YTruitt Merle 11/22/2017

## 2017-11-22 ENCOUNTER — Inpatient Hospital Stay: Payer: PPO

## 2017-11-22 ENCOUNTER — Inpatient Hospital Stay: Payer: PPO | Attending: Hematology | Admitting: Hematology

## 2017-11-22 ENCOUNTER — Telehealth: Payer: Self-pay | Admitting: Hematology

## 2017-11-22 ENCOUNTER — Encounter: Payer: Self-pay | Admitting: Hematology

## 2017-11-22 VITALS — BP 153/83 | HR 71 | Temp 98.7°F | Resp 16 | Ht 61.0 in | Wt 133.7 lb

## 2017-11-22 DIAGNOSIS — Z8719 Personal history of other diseases of the digestive system: Secondary | ICD-10-CM | POA: Insufficient documentation

## 2017-11-22 DIAGNOSIS — M5136 Other intervertebral disc degeneration, lumbar region: Secondary | ICD-10-CM | POA: Insufficient documentation

## 2017-11-22 DIAGNOSIS — Z170 Estrogen receptor positive status [ER+]: Secondary | ICD-10-CM | POA: Insufficient documentation

## 2017-11-22 DIAGNOSIS — J329 Chronic sinusitis, unspecified: Secondary | ICD-10-CM | POA: Diagnosis not present

## 2017-11-22 DIAGNOSIS — C50112 Malignant neoplasm of central portion of left female breast: Secondary | ICD-10-CM

## 2017-11-22 DIAGNOSIS — I2510 Atherosclerotic heart disease of native coronary artery without angina pectoris: Secondary | ICD-10-CM | POA: Insufficient documentation

## 2017-11-22 DIAGNOSIS — Z800 Family history of malignant neoplasm of digestive organs: Secondary | ICD-10-CM | POA: Insufficient documentation

## 2017-11-22 DIAGNOSIS — K219 Gastro-esophageal reflux disease without esophagitis: Secondary | ICD-10-CM | POA: Diagnosis not present

## 2017-11-22 DIAGNOSIS — F329 Major depressive disorder, single episode, unspecified: Secondary | ICD-10-CM | POA: Diagnosis not present

## 2017-11-22 DIAGNOSIS — N281 Cyst of kidney, acquired: Secondary | ICD-10-CM | POA: Diagnosis not present

## 2017-11-22 DIAGNOSIS — Z7982 Long term (current) use of aspirin: Secondary | ICD-10-CM | POA: Diagnosis not present

## 2017-11-22 DIAGNOSIS — R0981 Nasal congestion: Secondary | ICD-10-CM | POA: Insufficient documentation

## 2017-11-22 DIAGNOSIS — M810 Age-related osteoporosis without current pathological fracture: Secondary | ICD-10-CM | POA: Diagnosis not present

## 2017-11-22 DIAGNOSIS — Z803 Family history of malignant neoplasm of breast: Secondary | ICD-10-CM | POA: Diagnosis not present

## 2017-11-22 DIAGNOSIS — Z79818 Long term (current) use of other agents affecting estrogen receptors and estrogen levels: Secondary | ICD-10-CM

## 2017-11-22 DIAGNOSIS — Z87891 Personal history of nicotine dependence: Secondary | ICD-10-CM | POA: Insufficient documentation

## 2017-11-22 DIAGNOSIS — R079 Chest pain, unspecified: Secondary | ICD-10-CM

## 2017-11-22 DIAGNOSIS — F419 Anxiety disorder, unspecified: Secondary | ICD-10-CM | POA: Diagnosis not present

## 2017-11-22 DIAGNOSIS — Z8042 Family history of malignant neoplasm of prostate: Secondary | ICD-10-CM | POA: Insufficient documentation

## 2017-11-22 DIAGNOSIS — Z923 Personal history of irradiation: Secondary | ICD-10-CM | POA: Diagnosis not present

## 2017-11-22 DIAGNOSIS — Z79899 Other long term (current) drug therapy: Secondary | ICD-10-CM | POA: Insufficient documentation

## 2017-11-22 DIAGNOSIS — Z9012 Acquired absence of left breast and nipple: Secondary | ICD-10-CM | POA: Insufficient documentation

## 2017-11-22 DIAGNOSIS — Z9049 Acquired absence of other specified parts of digestive tract: Secondary | ICD-10-CM

## 2017-11-22 DIAGNOSIS — Z90710 Acquired absence of both cervix and uterus: Secondary | ICD-10-CM | POA: Diagnosis not present

## 2017-11-22 DIAGNOSIS — I10 Essential (primary) hypertension: Secondary | ICD-10-CM | POA: Insufficient documentation

## 2017-11-22 LAB — CBC WITH DIFFERENTIAL/PLATELET
Basophils Absolute: 0.1 10*3/uL (ref 0.0–0.1)
Basophils Relative: 1 %
Eosinophils Absolute: 0.3 10*3/uL (ref 0.0–0.5)
Eosinophils Relative: 8 %
HEMATOCRIT: 40.8 % (ref 34.8–46.6)
Hemoglobin: 13.2 g/dL (ref 11.6–15.9)
LYMPHS PCT: 20 %
Lymphs Abs: 0.7 10*3/uL — ABNORMAL LOW (ref 0.9–3.3)
MCH: 30.8 pg (ref 25.1–34.0)
MCHC: 32.4 g/dL (ref 31.5–36.0)
MCV: 95.3 fL (ref 79.5–101.0)
MONO ABS: 0.5 10*3/uL (ref 0.1–0.9)
MONOS PCT: 13 %
NEUTROS ABS: 2 10*3/uL (ref 1.5–6.5)
Neutrophils Relative %: 58 %
Platelets: 133 10*3/uL — ABNORMAL LOW (ref 145–400)
RBC: 4.28 MIL/uL (ref 3.70–5.45)
RDW: 13.2 % (ref 11.2–16.1)
WBC: 3.5 10*3/uL — ABNORMAL LOW (ref 3.9–10.3)

## 2017-11-22 LAB — COMPREHENSIVE METABOLIC PANEL
ALT: 13 U/L (ref 0–55)
ANION GAP: 8 (ref 3–11)
AST: 22 U/L (ref 5–34)
Albumin: 3.6 g/dL (ref 3.5–5.0)
Alkaline Phosphatase: 74 U/L (ref 40–150)
BUN: 12 mg/dL (ref 7–26)
CO2: 28 mmol/L (ref 22–29)
Calcium: 9.4 mg/dL (ref 8.4–10.4)
Chloride: 107 mmol/L (ref 98–109)
Creatinine, Ser: 0.85 mg/dL (ref 0.60–1.10)
GFR, EST NON AFRICAN AMERICAN: 60 mL/min — AB (ref >60)
Glucose, Bld: 70 mg/dL (ref 70–140)
POTASSIUM: 4.3 mmol/L (ref 3.3–4.7)
Sodium: 143 mmol/L (ref 136–145)
TOTAL PROTEIN: 6.4 g/dL (ref 6.4–8.3)
Total Bilirubin: 0.5 mg/dL (ref 0.2–1.2)

## 2017-11-22 MED ORDER — LEVOFLOXACIN 500 MG PO TABS: 500.0000 mg | ORAL_TABLET | Freq: Every day | ORAL | 0 refills | Status: DC

## 2017-11-22 MED ORDER — FULVESTRANT 250 MG/5ML IM SOLN
500.0000 mg | INTRAMUSCULAR | Status: DC
  Administered 2017-11-22: 500 mg via INTRAMUSCULAR

## 2017-11-22 NOTE — Patient Instructions (Signed)

## 2017-11-22 NOTE — Telephone Encounter (Signed)
Scheduled appt per 1/24 los - Gave patient AVS and calender per los.  

## 2017-11-23 LAB — CANCER ANTIGEN 27.29: CA 27.29: 32.3 U/mL (ref 0.0–38.6)

## 2017-12-20 ENCOUNTER — Inpatient Hospital Stay: Payer: PPO

## 2017-12-20 ENCOUNTER — Inpatient Hospital Stay: Payer: PPO | Attending: Hematology

## 2017-12-20 VITALS — BP 144/83 | HR 72 | Temp 97.9°F | Resp 18

## 2017-12-20 DIAGNOSIS — Z5111 Encounter for antineoplastic chemotherapy: Secondary | ICD-10-CM | POA: Diagnosis not present

## 2017-12-20 DIAGNOSIS — Z170 Estrogen receptor positive status [ER+]: Secondary | ICD-10-CM

## 2017-12-20 DIAGNOSIS — Z853 Personal history of malignant neoplasm of breast: Secondary | ICD-10-CM | POA: Insufficient documentation

## 2017-12-20 DIAGNOSIS — C44501 Unspecified malignant neoplasm of skin of breast: Secondary | ICD-10-CM | POA: Insufficient documentation

## 2017-12-20 DIAGNOSIS — C50112 Malignant neoplasm of central portion of left female breast: Secondary | ICD-10-CM

## 2017-12-20 LAB — COMPREHENSIVE METABOLIC PANEL
ALBUMIN: 3.6 g/dL (ref 3.5–5.0)
ALT: 13 U/L (ref 0–55)
ANION GAP: 9 (ref 3–11)
AST: 20 U/L (ref 5–34)
Alkaline Phosphatase: 67 U/L (ref 40–150)
BILIRUBIN TOTAL: 0.5 mg/dL (ref 0.2–1.2)
BUN: 13 mg/dL (ref 7–26)
CO2: 25 mmol/L (ref 22–29)
Calcium: 9.4 mg/dL (ref 8.4–10.4)
Chloride: 108 mmol/L (ref 98–109)
Creatinine, Ser: 0.85 mg/dL (ref 0.60–1.10)
GFR calc Af Amer: >60 mL/min (ref >60)
GFR calc non Af Amer: 60 mL/min — ABNORMAL LOW (ref >60)
GLUCOSE: 67 mg/dL — AB (ref 70–140)
POTASSIUM: 4 mmol/L (ref 3.5–5.1)
SODIUM: 142 mmol/L (ref 136–145)
TOTAL PROTEIN: 6.3 g/dL — AB (ref 6.4–8.3)

## 2017-12-20 LAB — CBC WITH DIFFERENTIAL/PLATELET
BASOS ABS: 0 10*3/uL (ref 0.0–0.1)
Basophils Relative: 1 %
Eosinophils Absolute: 0.2 10*3/uL (ref 0.0–0.5)
Eosinophils Relative: 6 %
HEMATOCRIT: 41 % (ref 34.8–46.6)
HEMOGLOBIN: 13.1 g/dL (ref 11.6–15.9)
LYMPHS PCT: 14 %
Lymphs Abs: 0.5 10*3/uL — ABNORMAL LOW (ref 0.9–3.3)
MCH: 30.8 pg (ref 25.1–34.0)
MCHC: 32 g/dL (ref 31.5–36.0)
MCV: 96.2 fL (ref 79.5–101.0)
MONO ABS: 0.4 10*3/uL (ref 0.1–0.9)
Monocytes Relative: 12 %
NEUTROS ABS: 2.3 10*3/uL (ref 1.5–6.5)
NEUTROS PCT: 67 %
Platelets: 114 10*3/uL — ABNORMAL LOW (ref 145–400)
RBC: 4.26 MIL/uL (ref 3.70–5.45)
RDW: 13.4 % (ref 11.2–14.5)
WBC: 3.4 10*3/uL — ABNORMAL LOW (ref 3.9–10.3)

## 2017-12-20 MED ORDER — FULVESTRANT 250 MG/5ML IM SOLN
500.0000 mg | INTRAMUSCULAR | Status: DC
  Administered 2017-12-20: 500 mg via INTRAMUSCULAR

## 2017-12-20 NOTE — Patient Instructions (Signed)

## 2017-12-21 ENCOUNTER — Other Ambulatory Visit: Payer: Self-pay | Admitting: Geriatric Medicine

## 2017-12-21 DIAGNOSIS — R1084 Generalized abdominal pain: Secondary | ICD-10-CM | POA: Diagnosis not present

## 2017-12-21 DIAGNOSIS — D696 Thrombocytopenia, unspecified: Secondary | ICD-10-CM | POA: Diagnosis not present

## 2017-12-21 DIAGNOSIS — S90221A Contusion of right lesser toe(s) with damage to nail, initial encounter: Secondary | ICD-10-CM | POA: Diagnosis not present

## 2017-12-21 LAB — CANCER ANTIGEN 27.29: CA 27.29: 25.9 U/mL (ref 0.0–38.6)

## 2018-01-01 ENCOUNTER — Other Ambulatory Visit: Payer: PPO

## 2018-01-15 NOTE — Progress Notes (Signed)
[place]  [phone] [phone]  OFFICE PROGRESS NOTE  PATIENT: Gloria [name]   DOB: [birth year]  MR#: [id number]  LZJ#:[id number]  KW:[id number], [name], MD [name], MD [name]. [name], MD  Date of Service:  01/17/2018    DIAGNOSIS:  An 84 y.o. [place] woman with invasive lobular carcinoma of the left breast diagnosed in 07/2010, chest wall local recurrence in 09/2015 .  Oncology History   Cancer of central portion of left female breast [facility])   Staging form: Breast, AJCC 7th Edition     Pathologic stage from 08/04/2010: Stage IIB (T2, N1a, cM0) - Signed by Truitt Merle, MD on 12/09/2015       Cancer of central portion of left female breast ([place])   08/03/2010 Mammogram     left nipple retraction, and the palpable mass subareolar left breast at the 6:00 position. Ultrasound confirmed the presence of a mass measuring 2.6 x 1.7 x 2.4 cm      08/04/2010 Initial Biopsy    left breast mass biopsy showed an invasive mammary carcinoma with lobular features. ER 95%, PR 97%, Ki-67 17%, HER-2/neu (-)      08/04/2010 Pathologic Stage    Stage IIB: T2 N1a      08/11/2010 Imaging    left breast mass 4.6 x 4.2 x 2.3 cm with enhancement distortion extending to the nipple retraction. There is questionable anterior mediastinal lymph node seen      07/2010 - 09/03/2010 Anti-estrogen oral therapy    neoadjuvant letrozole       09/03/2010 Surgery     left breast mastectomy with sentinel node biopsy on 09/03/2010.      08/2010 - 01/2015 Anti-estrogen oral therapy    Tamoxifen, pt stopped on her own       12/03/2010 - 01/23/2011 Radiation Therapy    left chest wall and axilla radiaiton       10/19/2015 Progression    Left chest wall recurrence, s/p resection on 11/25/2015 with positive margins ([facility])       12/10/2015 - 05/31/2017 Anti-estrogen oral therapy    anastrozole 28m daily, which was switched to Tamoxifen on 06/30/2016 due to persistent disease.       01/03/2016 - 03/03/2016 Radiation Therapy    Radiation to left chest wall recurrence (12/06/2015-02/04/2016 - 44 Gy), pt progressed through the radiation, and had additional 10 fractions of radiation (02/21/16-03/03/2016 - total 70 Gy to the left chest wall)      07/14/2016 PET scan    PET 07/14/2016 IMPRESSION: 1. No findings of recurrent malignancy. 2. Radiation fibrosis in the lingula. A small left axillary lymph node is not hypermetabolic and only 6 mm in diameter. This is in the vicinity of the prior axillary cyst. 3. Pelvic floor laxity.      07/17/2016 Surgery    Chest wall soft tissue resection for local recurrence      07/17/2016 Pathology Results    Left chest wall two soft tissue resection both showed invasive lobular carcinoma, margins were positive. ER 95% positive, PR 60% positive, HER-2 negative.      07/27/2016 Imaging    CT chest, abdomen and pelvis wo contrast  07/27/2016 IMPRESSION: Sigmoid diverticulosis without acute diverticulitis. No bowel obstruction or acute inflammation. Degenerative disc disease and facet arthropathy of the lower lumbar spine with slight grade 1 anterolisthesis of L4 on L5. Stable appearing simple left-sided renal cysts.      03/01/2017 Mammogram    Mammogram 03/01/2017  IMPRESSION: No mammographic evidence of malignancy. A result letter of this screening mammogram will be mailed directly to the patient. RECOMMENDATION: Screening mammogram in one year      04/10/2017 Imaging    PET IMPRESSION: 1. Stable exam.  No findings of recurrent malignancy. 2. Changes of external beam radiation again noted within the lingula. 3. Small left axillary lymph node exhibits mild increased uptake. Unchanged from previous exam.      06/07/2017 -  Anti-estrogen oral therapy    Stopped Tamoxifen and Began Faslodex IM injection on 06/07/17       09/19/2017 Imaging    CT CAP WO Contrast 09/19/17 IMPRESSION: 1. Surgical changes from a left mastectomy without CT  findings to suggest recurrent chest wall tumor or axillary lymphadenopathy. 2. No findings to suggest metastatic disease involving the neck, chest, abdomen or pelvis or osseous structures. 3. Stable radiation changes involving the lingula.       09/19/2017 Imaging    Bone Scan Whole Body  IMPRESSION: No definite scintigraphic evidence of osseous metastatic disease. Scattered degenerative type uptake as above.       CURRENT THERAPY:  monthly Faslodex injection started on 06/07/17    INTERVAL HISTORY:   Gloria [name] returns for follow-up and her Faslodex injection. She presents to the clinic today noting she is doing well. Her nephrologist gave her nitrofurantoin once every night to prevent bladder infection 9 months ago. She notes she takes it 2.5 weeks at a time and 2 months off. She has only had 1 bladder infection last year. She notes she saw her PCP, Dr. Felipa Eth 1-2 months ago. She notes her BP has been elevated. She is on Losartan.    On review of symptoms, pt notes pain in her left chest from tightness that it woke her. She notes she has diarrhea and abdominal pain. She has imodium but allows does not take it unless needed.     PAST MEDICAL HISTORY: Past Medical History:  Diagnosis Date  . Anxiety   . Blood in urine   . CAD (coronary artery disease)    non obstructive by cath  . Cancer [facility])    breast- left  . Depression   . GERD (gastroesophageal reflux disease)   . Hypertension   . Knee fracture   . Personal history of radiation therapy 2017  . TR (tricuspid regurgitation)    mild by Echo 12/2008 EF >55%    PAST SURGICAL HISTORY: Past Surgical History:  Procedure Laterality Date  . ABDOMINAL AORTAGRAM N/A 09/08/2011   Procedure: ABDOMINAL Maxcine Ham;  Surgeon: Troy Sine, MD;  Location: [facility] CATH LAB;  Service: Cardiovascular;  Laterality: N/A;  . ABDOMINAL HYSTERECTOMY    . BREAST EXCISIONAL BIOPSY Left 2017   X3  . BREAST SURGERY  08-30-10   mastectomy  .  CARDIAC CATHETERIZATION  08/2011   20% LAD stenosis, 20% diagonal stenosis, 10-20% proximal dominant RCA stenosis  . COLON SURGERY    . LEFT HEART CATHETERIZATION WITH CORONARY ANGIOGRAM N/A 09/08/2011   Procedure: LEFT HEART CATHETERIZATION WITH CORONARY ANGIOGRAM;  Surgeon: Troy Sine, MD;  Location: [facility] CATH LAB;  Service: Cardiovascular;  Laterality: N/A;  . MASTECTOMY    . MINOR BREAST BIOPSY Left 11/25/2015   Procedure: MINOR EXCISION MASS LEFT CHEST WALL;  Surgeon: Autumn Messing III, MD;  Location: [place];  Service: General;  Laterality: Left;  . MINOR BREAST BIOPSY Left 07/17/2016   Procedure: EXCISION OF 2 LEFT CHEST WALL MASSES;  Surgeon:  Autumn Messing III, MD;  Location: [place];  Service: General;  Laterality: Left;  . TONSILLECTOMY      FAMILY HISTORY: Family History  Problem Relation Age of Onset  . Cancer Father        prostate  . Cancer Sister        breast  . Heart disease Sister   . Breast cancer Sister   . Cancer Brother        pancreatic  . Cancer Maternal Grandmother        colon  . Dementia Mother   . Sudden death Brother   . Heart attack Brother   . Heart attack Brother   . Arthritis Sister   . Breast cancer Maternal Aunt     SOCIAL HISTORY: Social History   Tobacco Use  . Smoking status: Former Smoker    Last attempt to quit: 11/15/1991    Years since quitting: 26.1  . Smokeless tobacco: Never Used  Substance Use Topics  . Alcohol use: Yes    Alcohol/week: 1.2 oz    Types: 2 Glasses of wine per week    Comment: 1 glass per week  . Drug use: No    ALLERGIES: Allergies  Allergen Reactions  . Contrast Media [Iodinated Diagnostic Agents] Anaphylaxis    09/19/17-Pt premedicated. Then pt stated she was DNR. Spoke with [name] who did not realize allergy was recorded as anaphylactic. [name] verbally gave ok to change CT exam to without IV contrast. Pt and daughter do not recall occurrence of anaphylactic reaction to  contrast media. Geanie Kenning, 3:27pm  . Penicillins Anaphylaxis and Swelling    Has patient had a PCN reaction causing immediate rash, facial/tongue/throat swelling, SOB or lightheadedness with hypotension: Yes Has patient had a PCN reaction causing severe rash involving mucus membranes or skin necrosis: No Has patient had a PCN reaction that required hospitalization Yes Has patient had a PCN reaction occurring within the last 10 years: No If all of the above answers are "NO", then may proceed with Cephalosporin use.   . Adhesive [Tape] Other (See Comments)    blisters  . Cephalexin Swelling  . Ciprofloxacin Swelling  . Demerol Nausea And Vomiting  . Sulfamethoxazole-Trimethoprim Itching and Swelling    REACTION: swelling/hives  . Quinolones Itching and Rash    REACTION: itching, rash Pt. Reports no problems with levaquin     MEDICATIONS:  Current Outpatient Medications  Medication Sig Dispense Refill  . acetaminophen (TYLENOL) 500 MG tablet Take 1,000 mg by mouth daily as needed for moderate pain. Reported on 03/10/2016    . ALPRAZolam (XANAX) 0.25 MG tablet Take 1/2 tablet in am and 1 tablet prior to bedtime. 45 tablet 2  . aspirin 81 MG chewable tablet Chew 81 mg by mouth once a week. Reported on 03/10/2016    . CALCIUM-MAGNESIUM-VITAMIN D PO Take 1 tablet by mouth 2 (two) times daily.    . cetirizine (ZYRTEC) 10 MG tablet Take 10 mg by mouth as needed for allergies.     . diphenhydrAMINE (BENADRYL) 50 MG tablet Take 1 tablet (50 mg total) by mouth as directed. Take Benadryl 50 mg po 1 hour before CT scan. 1 tablet 0  . doxepin (SINEQUAN) 10 MG capsule Take 10 mg by mouth at bedtime.     . fluconazole (DIFLUCAN) 100 MG tablet Take 1 tablet (100 mg total) by mouth daily. 1 tablet 0  . fluticasone (FLONASE) 50 MCG/ACT nasal spray Place 1 spray  into both nostrils daily as needed for allergies.     Marland Kitchen guaiFENesin (MUCINEX) 600 MG 12 hr tablet Take 600 mg by mouth daily as needed for  cough or to loosen phlegm. Reported on 03/28/2016    . losartan (COZAAR) 25 MG tablet Take 25 mg by mouth daily.   0  . potassium chloride (K-DUR) 10 MEQ tablet Take 10 mEq by mouth as needed.     . pseudoephedrine-acetaminophen (TYLENOL SINUS) 30-500 MG TABS tablet Take 1 tablet by mouth every 4 (four) hours as needed.    . triamcinolone (KENALOG) 0.025 % cream Apply 1 application topically 2 (two) times daily. 30 g 0  . nitroGLYCERIN (NITROSTAT) 0.4 MG SL tablet Place 1 tablet (0.4 mg total) under the tongue every 5 (five) minutes as needed for chest pain. <PLEASE MAKE APPOINTMENT> (Patient not taking: Reported on 07/26/2017) 25 tablet 3   No current facility-administered medications for this visit.    Facility-Administered Medications Ordered in Other Visits  Medication Dose Route Frequency Provider Last Rate Last Dose  . fulvestrant (FASLODEX) injection 500 mg  500 mg Intramuscular Q14 Days Truitt Merle, MD   500 mg at 01/17/18 1238    REVIEW OF SYSTEMS:  A 10 point review of systems was completed and is negative except as noted in HPI.    PHYSICAL EXAMINATION:  BP (!) 145/82 (Patient Position: Sitting) Comment: Thu RN is aware  Pulse 66   Temp 98.1 F (36.7 C) (Oral)   Resp 17   Ht 5' 1" (1.549 m)   Wt 130 lb 14.4 oz (59.4 kg)   SpO2 100%   BMI 24.73 kg/m    GENERAL: Patient is a well appearing female in no acute distress HEENT:  Sclerae anicteric.  Oropharynx clear and moist. No ulcerations or evidence of oropharyngeal candidiasis. Neck is supple.  NODES:   There is no cervical , supraclavicular, or axillary lymphadenopathy palpated.  LUNGS:  Clear to auscultation bilaterally.  No wheezes or rhonchi. HEART:  Regular rate and rhythm. No murmur appreciated.  Chest:  ABDOMEN:  Soft, nontender.  normoactive bowel sounds. No organomegaly palpated.  MSK:  No focal spinal tenderness to palpation. Full range of motion bilaterally in the upper extremities. EXTREMITIES:  No peripheral  edema.   SKIN:  Clear with no obvious rashes or skin changes.  NEURO:  Nonfocal. Well oriented.  Appropriate affect. BREAST EXAM: Left breast is surgically absent, incision in the left frontal chest has healed well. Previous multiple 5-10 mm nodules along the incision line are much less prominent, barely palpable now, with tenderness upon palpation. Right breast and bilateral axilla notable for no masses or nodules.    ECOG PS:  1   LAB RESULTS: CBC Latest Ref Rng & Units 01/17/2018 12/20/2017 11/22/2017  WBC 3.9 - 10.3 K/uL 3.1(L) 3.4(L) 3.5(L)  Hemoglobin 11.6 - 15.9 g/dL 13.7 13.1 13.2  Hematocrit 34.8 - 46.6 % 41.5 41.0 40.8  Platelets 145 - 400 K/uL 151 114(L) 133(L)      CMP Latest Ref Rng & Units 01/17/2018 12/20/2017 11/22/2017  Glucose 70 - 140 mg/dL 84 67(L) 70  BUN 7 - 26 mg/dL _0 Creatinine 0.60 - 1.10 mg/dL 0.90 0.85 0.85  Sodium 136 - 145 mmol/L 143 142 143  Potassium 3.5 - 5.1 mmol/L 3.7 4.0 4.3  Chloride 98 - 109 mmol/L 106 108 107  CO2 22 - 29 mmol/L _1 Calcium 8.4 - 10.4 mg/dL 9.8 9.4 9.4  Total Protein 6.4 - 8.3 g/dL 6.4 6.3(L) 6.4  Total Bilirubin 0.2 - 1.2 mg/dL 0.6 0.5 0.5  Alkaline Phos 40 - 150 U/L 70 67 74  AST 5 - 34 U/L _0 ALT 0 - 55 U/L _1 CA antigen 27.29 on 03/21/17 was 32.7  PATHOLOGY REPORT Diagnosis 11/25/2015 Soft tissue mass, simple excision, Left chest wall - INVASIVE LOBULAR CARCINOMA, PRESENT AT NON ORIENTED TISSUE EDGE(S). - SEE COMMENT. Microscopic Comment The carcinoma appears grade 2. A breast prognostic profile will be performed and the results reported separately. (JDP:gt, 11/26/15).  Results: IMMUNOHISTOCHEMICAL AND MORPHOMETRIC ANALYSIS PERFORMED MANUALLY Estrogen Receptor: 95%, POSITIVE, STRONG STAINING INTENSITY Progesterone Receptor: 60%, POSITIVE, STRONG STAINING INTENSITY Results: HER2 - NEGATIVE RATIO OF HER2/CEP17 SIGNALS 1.07 AVERAGE HER2 COPY NUMBER PER CELL 2.90  Diagnosis 07/17/2016 1.  Soft tissue mass, simple excision, Chest wall medial left - INVASIVE LOBULAR CARCINOMA, SEE COMMENT. - NODULE OF FIBROSIS AND CALCIFICATION. 2. Soft tissue mass, simple excision, Chest wall lateral left - INVASIVE LOBULAR CARCINOMA, SEE COMMENT. Microscopic Comment 1. -2. Both parts reveal foci of invasive lobular carcinoma. There is only limited tumor in part #1. Tumor does extend to the uninked edges of both parts. The carcinoma appears grade 2. A prognostic panel has been ordered on part #2.  2. PROGNOSTIC INDICATORS Results: IMMUNOHISTOCHEMICAL AND MORPHOMETRIC ANALYSIS PERFORMED MANUALLY Estrogen Receptor: 60%, POSITIVE, STRONG STAINING INTENSITY Progesterone Receptor: 90%, POSITIVE, STRONG STAINING INTENSITY Proliferation Marker Ki67: 10%  Results: HER2 - NEGATIVE RATIO OF HER2/CEP17 SIGNALS 1.20 AVERAGE HER2 COPY NUMBER PER CELL 2.40   RADIOGRAPHIC STUDIES:   Bone Scan Whole Body 09/19/17  IMPRESSION: No definite scintigraphic evidence of osseous metastatic disease. Scattered degenerative type uptake as above.   CT CAP WO Contrast 09/19/17 IMPRESSION: 1. Surgical changes from a left mastectomy without CT findings to suggest recurrent chest wall tumor or axillary lymphadenopathy. 2. No findings to suggest metastatic disease involving the neck, chest, abdomen or pelvis or osseous structures. 3. Stable radiation changes involving the lingula.    PET 04/10/17 IMPRESSION: 1. Stable exam.  No findings of recurrent malignancy. 2. Changes of external beam radiation again noted within the lingula. 3. Small left axillary lymph node exhibits mild increased uptake. Unchanged from previous exam.   Mammogram 03/01/2017 IMPRESSION: No mammographic evidence of malignancy. A result letter of this screening mammogram will be mailed directly to the patient. RECOMMENDATION: Screening mammogram in one year   CT chest, abdomen and pelvis wo contrast   07/27/2016 IMPRESSION: Sigmoid diverticulosis without acute diverticulitis. No bowel obstruction or acute inflammation. Degenerative disc disease and facet arthropathy of the lower lumbar spine with slight grade 1 anterolisthesis of L4 on L5. Stable appearing simple left-sided renal cysts.  PET 07/14/2016 IMPRESSION: 1. No findings of recurrent malignancy. 2. Radiation fibrosis in the lingula. A small left axillary lymph node is not hypermetabolic and only 6 mm in diameter. This is in the vicinity of the prior axillary cyst. 3. Pelvic floor laxity.  ASSESSMENT/PLAN: 84 y.o. female with: invasive lobular carcinoma of the left breast   1. Cancer of the central portion of left female breast, Stage IIB, T2 N1 invasive lobular carcinoma, grade 2, ER 95%, PR 97%, Ki-67 17%, HER-2/neu no amplification, diagnosed in 07/2010, local chest wall recurrence in 09/2015   -She was on adjuvant tamoxifen for 4.5 years, stopped on her own. -She unfortunately developed local recurrence 8 months after she stopped tamoxifen. -I previously reviewed her surgical pathology findings,  which is consistent with lobular carcinoma recurrence from her previous breast cancer. -The tumor was surgically removed, with positive margins. -Her restaging CT scan and bone scan in 12/2015 was negative for distant metastasis. -She has completed chest wall radiation, however developed 2-3 small nodules during the radiation and anastrozole therapy, suspicious for residual cancer. US showed a 4 mm and 6 mm subcutaneous nodule, partially calcified, highly suspicious for local recurrence. -She subsequently underwent a second chest wall surgery for two soft tissue resection in 06/2016, I previously reviewed her pathology results, both showed invasive lobular carcinoma, margins were positive. I spoke with Dr. Marlou Starks, no more surgery will be offered, because complete surgical resection is unlikely. Patient is also not in favor of more extensive  surgery. -We previously discussed that her cancer is unlikely curable at this stage, and our goal of therapy is palliative for disease control, and prolong her life. She voiced good understanding. -I reviewed her restaging PET scan from 04/10/2017, which was negative for metastatic disease -She has developed more chest wall tenderness, the tumor nodules along the incision line are slightly bigger and tender, I think she has had some disease progression. I have switched her tamoxifen to fulvestrant injection on 06/07/17. -She has been tolerating Faslodex injections well overall, with some pain at injection sites  -Due to possible anaphylactic reaction, she will not receive CT contrast with scans.  -We discussed her CT CAP wo contrast and bone scan from 09/19/17 which shows no bone metastasis and no cancer spread beyond chest wall.  -she is clinically do well overall, still has mild pain at the left chest wall, exam today showed nodules are much smaller now around her incision line, otherwise negative. No clinical concern for disease progression. -I discussed her pain is Liley from surgery and RT. I encouraged to see her PCP about abdominal discomfort and diarrhea.  -Lab adequate to proceed with Fulvestrant injection today and continue monthly.  -plan to repeat CT or PET scan in May 2019  -F/u in 8 weeks   2. HTN, CAD  - She'll continue follow-up with her primary care physician and cardiologist -Her BP has been elevated in clinic last 2 visit. I encouraged her to monitor her BP at home and if remains elevated she should see her PCP. She is on Losartan.   3. Osteoporosis  - her last DEXA scan in 06/2012 showed osteoporosis  - I previously encouraged her to continue calcium and Vit D - I previously encouraged her to repeat DEXA at her PCP office   4. Left Chest Pain  -Secondary to the small tumor nodules on the chest wall. -Manageable, she cannot take much pain medication.  -Since she has been  having continued pain, I did offer her Gabapentin; however, she declined this for now. I encouraged her to take Tylenol and Ibuprofen for further management. I also encouraged her to keep her fluid intake up while taking these medications.  -I previously advised her to regularly stretch her left arm to help with ROM and tightening of her incision location.  -Her pain is manageable, her breast nodules have gotten much smaller.   5. Epigastric pain -She has developed mild intermittent epigastric pain lately, exam was negative, nontender, other GI symptoms -We will monitor, she will discussed with her primary care physician also.   6. Goal of care discussion  -We again discussed the incurable nature of her cancer, and the overall poor prognosis, especially if he/she does not have good response to  chemotherapy or progress on chemo -The patient understands the goal of care is palliative. -I recommend DNR/DNI, she will think about it     Plan -Injection in 4 and 8 weeks -Lab and f/u with Lacie in 8 weeks with a PET a few days before    I spent 20 minutes counseling the patient face to face. The total time spent in the appointment was 30 minutes.  This document serves as a record of services personally performed by Truitt Merle, MD. It was created on her behalf by Joslyn Devon, a trained medical scribe. The creation of this record is based on the scribe's personal observations and the provider's statements to them.    I have reviewed the above documentation for accuracy and completeness, and I agree with the above.   Truitt Merle  01/17/2018

## 2018-01-17 ENCOUNTER — Inpatient Hospital Stay (HOSPITAL_BASED_OUTPATIENT_CLINIC_OR_DEPARTMENT_OTHER): Payer: PPO | Admitting: Hematology

## 2018-01-17 ENCOUNTER — Encounter: Payer: Self-pay | Admitting: Hematology

## 2018-01-17 ENCOUNTER — Telehealth: Payer: Self-pay

## 2018-01-17 ENCOUNTER — Inpatient Hospital Stay: Payer: PPO | Attending: Hematology

## 2018-01-17 ENCOUNTER — Inpatient Hospital Stay: Payer: PPO

## 2018-01-17 VITALS — BP 145/82 | HR 66 | Temp 98.1°F | Resp 17 | Ht 61.0 in | Wt 130.9 lb

## 2018-01-17 DIAGNOSIS — Z8041 Family history of malignant neoplasm of ovary: Secondary | ICD-10-CM | POA: Diagnosis not present

## 2018-01-17 DIAGNOSIS — Z79818 Long term (current) use of other agents affecting estrogen receptors and estrogen levels: Secondary | ICD-10-CM | POA: Insufficient documentation

## 2018-01-17 DIAGNOSIS — F329 Major depressive disorder, single episode, unspecified: Secondary | ICD-10-CM | POA: Insufficient documentation

## 2018-01-17 DIAGNOSIS — I071 Rheumatic tricuspid insufficiency: Secondary | ICD-10-CM | POA: Insufficient documentation

## 2018-01-17 DIAGNOSIS — Z7982 Long term (current) use of aspirin: Secondary | ICD-10-CM

## 2018-01-17 DIAGNOSIS — Z923 Personal history of irradiation: Secondary | ICD-10-CM | POA: Insufficient documentation

## 2018-01-17 DIAGNOSIS — R109 Unspecified abdominal pain: Secondary | ICD-10-CM

## 2018-01-17 DIAGNOSIS — R197 Diarrhea, unspecified: Secondary | ICD-10-CM | POA: Insufficient documentation

## 2018-01-17 DIAGNOSIS — I10 Essential (primary) hypertension: Secondary | ICD-10-CM | POA: Diagnosis not present

## 2018-01-17 DIAGNOSIS — R1013 Epigastric pain: Secondary | ICD-10-CM | POA: Diagnosis not present

## 2018-01-17 DIAGNOSIS — Z170 Estrogen receptor positive status [ER+]: Secondary | ICD-10-CM | POA: Diagnosis not present

## 2018-01-17 DIAGNOSIS — M5136 Other intervertebral disc degeneration, lumbar region: Secondary | ICD-10-CM

## 2018-01-17 DIAGNOSIS — M810 Age-related osteoporosis without current pathological fracture: Secondary | ICD-10-CM

## 2018-01-17 DIAGNOSIS — Z79811 Long term (current) use of aromatase inhibitors: Secondary | ICD-10-CM | POA: Insufficient documentation

## 2018-01-17 DIAGNOSIS — Z79899 Other long term (current) drug therapy: Secondary | ICD-10-CM | POA: Insufficient documentation

## 2018-01-17 DIAGNOSIS — K219 Gastro-esophageal reflux disease without esophagitis: Secondary | ICD-10-CM | POA: Insufficient documentation

## 2018-01-17 DIAGNOSIS — C50112 Malignant neoplasm of central portion of left female breast: Secondary | ICD-10-CM

## 2018-01-17 DIAGNOSIS — R079 Chest pain, unspecified: Secondary | ICD-10-CM

## 2018-01-17 DIAGNOSIS — Z87891 Personal history of nicotine dependence: Secondary | ICD-10-CM | POA: Insufficient documentation

## 2018-01-17 DIAGNOSIS — I2510 Atherosclerotic heart disease of native coronary artery without angina pectoris: Secondary | ICD-10-CM | POA: Diagnosis not present

## 2018-01-17 DIAGNOSIS — Z9012 Acquired absence of left breast and nipple: Secondary | ICD-10-CM

## 2018-01-17 DIAGNOSIS — Z803 Family history of malignant neoplasm of breast: Secondary | ICD-10-CM | POA: Diagnosis not present

## 2018-01-17 DIAGNOSIS — R1084 Generalized abdominal pain: Secondary | ICD-10-CM

## 2018-01-17 LAB — COMPREHENSIVE METABOLIC PANEL
ALT: 13 U/L (ref 0–55)
AST: 21 U/L (ref 5–34)
Albumin: 3.7 g/dL (ref 3.5–5.0)
Alkaline Phosphatase: 70 U/L (ref 40–150)
Anion gap: 9 (ref 3–11)
BUN: 14 mg/dL (ref 7–26)
CHLORIDE: 106 mmol/L (ref 98–109)
CO2: 28 mmol/L (ref 22–29)
Calcium: 9.8 mg/dL (ref 8.4–10.4)
Creatinine, Ser: 0.9 mg/dL (ref 0.60–1.10)
GFR calc Af Amer: >60 mL/min (ref >60)
GFR, EST NON AFRICAN AMERICAN: 56 mL/min — AB (ref >60)
Glucose, Bld: 84 mg/dL (ref 70–140)
Potassium: 3.7 mmol/L (ref 3.5–5.1)
Sodium: 143 mmol/L (ref 136–145)
Total Bilirubin: 0.6 mg/dL (ref 0.2–1.2)
Total Protein: 6.4 g/dL (ref 6.4–8.3)

## 2018-01-17 LAB — CBC WITH DIFFERENTIAL/PLATELET
Basophils Absolute: 0.1 10*3/uL (ref 0.0–0.1)
Basophils Relative: 2 %
EOS ABS: 0.3 10*3/uL (ref 0.0–0.5)
EOS PCT: 8 %
HCT: 41.5 % (ref 34.8–46.6)
Hemoglobin: 13.7 g/dL (ref 11.6–15.9)
LYMPHS ABS: 0.6 10*3/uL — AB (ref 0.9–3.3)
Lymphocytes Relative: 21 %
MCH: 30.8 pg (ref 25.1–34.0)
MCHC: 33 g/dL (ref 31.5–36.0)
MCV: 93.4 fL (ref 79.5–101.0)
MONOS PCT: 9 %
Monocytes Absolute: 0.3 10*3/uL (ref 0.1–0.9)
Neutro Abs: 1.9 10*3/uL (ref 1.5–6.5)
Neutrophils Relative %: 60 %
PLATELETS: 151 10*3/uL (ref 145–400)
RBC: 4.44 MIL/uL (ref 3.70–5.45)
RDW: 13.3 % (ref 11.2–14.5)
WBC: 3.1 10*3/uL — ABNORMAL LOW (ref 3.9–10.3)

## 2018-01-17 MED ORDER — FULVESTRANT 250 MG/5ML IM SOLN
500.0000 mg | INTRAMUSCULAR | Status: DC
  Administered 2018-01-17: 500 mg via INTRAMUSCULAR

## 2018-01-17 NOTE — Telephone Encounter (Signed)
Printed avs and calender of upcoming appointment. Per 3/21 los 

## 2018-01-17 NOTE — Patient Instructions (Signed)

## 2018-01-18 LAB — CANCER ANTIGEN 27.29: CA 27.29: 29.4 U/mL (ref 0.0–38.6)

## 2018-01-30 ENCOUNTER — Other Ambulatory Visit: Payer: Self-pay | Admitting: Hematology

## 2018-02-04 ENCOUNTER — Other Ambulatory Visit: Payer: Self-pay | Admitting: *Deleted

## 2018-02-04 MED ORDER — ALPRAZOLAM 0.25 MG PO TABS: ORAL_TABLET | ORAL | 2 refills | Status: DC

## 2018-02-14 ENCOUNTER — Inpatient Hospital Stay: Payer: PPO | Attending: Hematology

## 2018-02-14 VITALS — BP 138/79 | HR 84 | Temp 98.0°F | Resp 19

## 2018-02-14 DIAGNOSIS — Z170 Estrogen receptor positive status [ER+]: Secondary | ICD-10-CM | POA: Insufficient documentation

## 2018-02-14 DIAGNOSIS — Z79818 Long term (current) use of other agents affecting estrogen receptors and estrogen levels: Secondary | ICD-10-CM | POA: Diagnosis not present

## 2018-02-14 DIAGNOSIS — C50112 Malignant neoplasm of central portion of left female breast: Secondary | ICD-10-CM | POA: Insufficient documentation

## 2018-02-14 MED ORDER — FULVESTRANT 250 MG/5ML IM SOLN
500.0000 mg | INTRAMUSCULAR | Status: DC
  Administered 2018-02-14: 500 mg via INTRAMUSCULAR

## 2018-02-20 DIAGNOSIS — H6120 Impacted cerumen, unspecified ear: Secondary | ICD-10-CM | POA: Diagnosis not present

## 2018-02-20 DIAGNOSIS — H9201 Otalgia, right ear: Secondary | ICD-10-CM | POA: Diagnosis not present

## 2018-02-28 ENCOUNTER — Other Ambulatory Visit: Payer: PPO

## 2018-03-01 DIAGNOSIS — C50919 Malignant neoplasm of unspecified site of unspecified female breast: Secondary | ICD-10-CM | POA: Diagnosis not present

## 2018-03-01 DIAGNOSIS — D696 Thrombocytopenia, unspecified: Secondary | ICD-10-CM | POA: Diagnosis not present

## 2018-03-01 DIAGNOSIS — Z0000 Encounter for general adult medical examination without abnormal findings: Secondary | ICD-10-CM | POA: Diagnosis not present

## 2018-03-01 DIAGNOSIS — J449 Chronic obstructive pulmonary disease, unspecified: Secondary | ICD-10-CM | POA: Diagnosis not present

## 2018-03-01 DIAGNOSIS — H6123 Impacted cerumen, bilateral: Secondary | ICD-10-CM | POA: Diagnosis not present

## 2018-03-01 DIAGNOSIS — Z1389 Encounter for screening for other disorder: Secondary | ICD-10-CM | POA: Diagnosis not present

## 2018-03-01 DIAGNOSIS — I10 Essential (primary) hypertension: Secondary | ICD-10-CM | POA: Diagnosis not present

## 2018-03-01 DIAGNOSIS — Z79899 Other long term (current) drug therapy: Secondary | ICD-10-CM | POA: Diagnosis not present

## 2018-03-07 ENCOUNTER — Ambulatory Visit
Admission: RE | Admit: 2018-03-07 | Discharge: 2018-03-07 | Disposition: A | Discharge status: Home / Self Care | Payer: PPO | Source: Ambulatory Visit | Attending: Geriatric Medicine | Admitting: Geriatric Medicine

## 2018-03-07 DIAGNOSIS — R1084 Generalized abdominal pain: Secondary | ICD-10-CM

## 2018-03-07 DIAGNOSIS — N281 Cyst of kidney, acquired: Secondary | ICD-10-CM | POA: Diagnosis not present

## 2018-03-11 ENCOUNTER — Encounter (HOSPITAL_COMMUNITY)
Admission: RE | Admit: 2018-03-11 | Discharge: 2018-03-11 | Disposition: A | Discharge status: Home / Self Care | Payer: PPO | Source: Ambulatory Visit | Attending: Hematology | Admitting: Hematology

## 2018-03-11 DIAGNOSIS — Z170 Estrogen receptor positive status [ER+]: Secondary | ICD-10-CM | POA: Diagnosis not present

## 2018-03-11 DIAGNOSIS — C50112 Malignant neoplasm of central portion of left female breast: Secondary | ICD-10-CM | POA: Diagnosis not present

## 2018-03-11 DIAGNOSIS — C50919 Malignant neoplasm of unspecified site of unspecified female breast: Secondary | ICD-10-CM | POA: Diagnosis not present

## 2018-03-11 LAB — GLUCOSE, CAPILLARY: GLUCOSE-CAPILLARY: 95 mg/dL (ref 65–99)

## 2018-03-11 MED ORDER — FLUDEOXYGLUCOSE F - 18 (FDG) INJECTION
6.5000 | Freq: Once | INTRAVENOUS | Status: AC | PRN
  Administered 2018-03-11: 6.5 via INTRAVENOUS

## 2018-03-12 ENCOUNTER — Telehealth: Payer: Self-pay

## 2018-03-12 NOTE — Telephone Encounter (Signed)
Patient called to confirm 5/16 appointments amongst concern that appointment reminder call system was not accepting patient confirmation response of "1".

## 2018-03-13 DIAGNOSIS — H353132 Nonexudative age-related macular degeneration, bilateral, intermediate dry stage: Secondary | ICD-10-CM | POA: Diagnosis not present

## 2018-03-13 DIAGNOSIS — H26491 Other secondary cataract, right eye: Secondary | ICD-10-CM | POA: Diagnosis not present

## 2018-03-13 DIAGNOSIS — H26492 Other secondary cataract, left eye: Secondary | ICD-10-CM | POA: Diagnosis not present

## 2018-03-13 DIAGNOSIS — H348322 Tributary (branch) retinal vein occlusion, left eye, stable: Secondary | ICD-10-CM | POA: Diagnosis not present

## 2018-03-13 NOTE — Progress Notes (Signed)
[place]  Telephone:(336) [phone] Fax:(336) [phone]  OFFICE PROGRESS NOTE  PATIENT: Gloria [name]   DOB: [birth year]  MR#: [id number]  WNI#:[id number]  [id number], [name], MD [name], MD [name]. [name], MD  Date of Service:  03/14/2018    DIAGNOSIS:  An 84 y.o. [place] woman with invasive lobular carcinoma of the left breast diagnosed in 07/2010, chest wall local recurrence in 09/2015 .  Oncology History   Cancer of central portion of left female breast [facility])   Staging form: Breast, AJCC 7th Edition     Pathologic stage from 08/04/2010: Stage IIB (T2, N1a, cM0) - Signed by Truitt Merle, MD on 12/09/2015       Cancer of central portion of left female breast ([place])   08/03/2010 Mammogram     left nipple retraction, and the palpable mass subareolar left breast at the 6:00 position. Ultrasound confirmed the presence of a mass measuring 2.6 x 1.7 x 2.4 cm      08/04/2010 Initial Biopsy    left breast mass biopsy showed an invasive mammary carcinoma with lobular features. ER 95%, PR 97%, Ki-67 17%, HER-2/neu (-)      08/04/2010 Pathologic Stage    Stage IIB: T2 N1a      08/11/2010 Imaging    left breast mass 4.6 x 4.2 x 2.3 cm with enhancement distortion extending to the nipple retraction. There is questionable anterior mediastinal lymph node seen      07/2010 - 09/03/2010 Anti-estrogen oral therapy    neoadjuvant letrozole       09/03/2010 Surgery     left breast mastectomy with sentinel node biopsy on 09/03/2010.      08/2010 - 01/2015 Anti-estrogen oral therapy    Tamoxifen, pt stopped on her own       12/03/2010 - 01/23/2011 Radiation Therapy    left chest wall and axilla radiaiton       10/19/2015 Progression    Left chest wall recurrence, s/p resection on 11/25/2015 with positive margins ([facility])       12/10/2015 - 05/31/2017 Anti-estrogen oral therapy    anastrozole 23m daily, which was switched to Tamoxifen on 06/30/2016 due to persistent disease.       01/03/2016 - 03/03/2016 Radiation Therapy    Radiation to left chest wall recurrence (12/06/2015-02/04/2016 - 44 Gy), pt progressed through the radiation, and had additional 10 fractions of radiation (02/21/16-03/03/2016 - total 70 Gy to the left chest wall)      07/14/2016 PET scan    PET 07/14/2016 IMPRESSION: 1. No findings of recurrent malignancy. 2. Radiation fibrosis in the lingula. A small left axillary lymph node is not hypermetabolic and only 6 mm in diameter. This is in the vicinity of the prior axillary cyst. 3. Pelvic floor laxity.      07/17/2016 Surgery    Chest wall soft tissue resection for local recurrence      07/17/2016 Pathology Results    Left chest wall two soft tissue resection both showed invasive lobular carcinoma, margins were positive. ER 95% positive, PR 60% positive, HER-2 negative.      07/27/2016 Imaging    CT chest, abdomen and pelvis wo contrast  07/27/2016 IMPRESSION: Sigmoid diverticulosis without acute diverticulitis. No bowel obstruction or acute inflammation. Degenerative disc disease and facet arthropathy of the lower lumbar spine with slight grade 1 anterolisthesis of L4 on L5. Stable appearing simple left-sided renal cysts.      03/01/2017 Mammogram    Mammogram 03/01/2017  IMPRESSION: No mammographic evidence of malignancy. A result letter of this screening mammogram will be mailed directly to the patient. RECOMMENDATION: Screening mammogram in one year      04/10/2017 Imaging    PET IMPRESSION: 1. Stable exam.  No findings of recurrent malignancy. 2. Changes of external beam radiation again noted within the lingula. 3. Small left axillary lymph node exhibits mild increased uptake. Unchanged from previous exam.      06/07/2017 -  Anti-estrogen oral therapy    Stopped Tamoxifen and Began Faslodex IM injection on 06/07/17       09/19/2017 Imaging    CT CAP WO Contrast 09/19/17 IMPRESSION: 1. Surgical changes from a left mastectomy without CT  findings to suggest recurrent chest wall tumor or axillary lymphadenopathy. 2. No findings to suggest metastatic disease involving the neck, chest, abdomen or pelvis or osseous structures. 3. Stable radiation changes involving the lingula.       09/19/2017 Imaging    Bone Scan Whole Body  IMPRESSION: No definite scintigraphic evidence of osseous metastatic disease. Scattered degenerative type uptake as above.      03/11/2018 PET scan    IMPRESSION:  1. No evidence of recurrent disease. 2.  Aortic atherosclerosis        CURRENT THERAPY:  monthly Faslodex injection started on 06/07/17    INTERVAL HISTORY:   Gloria [name] returns for follow-up and her Faslodex injection. She has been doing well overall. She notes that she is not having to pay for the faslodex.   Since her last visit to the office, she underwent a PET scan on 03/11/2018 with results showing: No evidence of recurrent disease. Aortic atherosclerosis   On review of systems, she denies any other symptoms. Pertinent positives are listed and detailed within the above HPI.    PAST MEDICAL HISTORY: Past Medical History:  Diagnosis Date  . Anxiety   . Blood in urine   . CAD (coronary artery disease)    non obstructive by cath  . Cancer [facility])    breast- left  . Depression   . GERD (gastroesophageal reflux disease)   . Hypertension   . Knee fracture   . Personal history of radiation therapy 2017  . TR (tricuspid regurgitation)    mild by Echo 12/2008 EF >55%    PAST SURGICAL HISTORY: Past Surgical History:  Procedure Laterality Date  . ABDOMINAL AORTAGRAM N/A 09/08/2011   Procedure: ABDOMINAL Maxcine Ham;  Surgeon: Troy Sine, MD;  Location: [facility] CATH LAB;  Service: Cardiovascular;  Laterality: N/A;  . ABDOMINAL HYSTERECTOMY    . BREAST EXCISIONAL BIOPSY Left 2017   X3  . BREAST SURGERY  08-30-10   mastectomy  . CARDIAC CATHETERIZATION  08/2011   20% LAD stenosis, 20% diagonal stenosis, 10-20% proximal dominant RCA  stenosis  . COLON SURGERY    . LEFT HEART CATHETERIZATION WITH CORONARY ANGIOGRAM N/A 09/08/2011   Procedure: LEFT HEART CATHETERIZATION WITH CORONARY ANGIOGRAM;  Surgeon: Troy Sine, MD;  Location: [facility] CATH LAB;  Service: Cardiovascular;  Laterality: N/A;  . MASTECTOMY    . MINOR BREAST BIOPSY Left 11/25/2015   Procedure: MINOR EXCISION MASS LEFT CHEST WALL;  Surgeon: Autumn Messing III, MD;  Location: [place];  Service: General;  Laterality: Left;  . MINOR BREAST BIOPSY Left 07/17/2016   Procedure: EXCISION OF 2 LEFT CHEST WALL MASSES;  Surgeon: Autumn Messing III, MD;  Location: [place];  Service: General;  Laterality: Left;  . TONSILLECTOMY  FAMILY HISTORY: Family History  Problem Relation Age of Onset  . Cancer Father        prostate  . Cancer Sister        breast  . Heart disease Sister   . Breast cancer Sister   . Cancer Brother        pancreatic  . Cancer Maternal Grandmother        colon  . Dementia Mother   . Sudden death Brother   . Heart attack Brother   . Heart attack Brother   . Arthritis Sister   . Breast cancer Maternal Aunt     SOCIAL HISTORY: Social History   Tobacco Use  . Smoking status: Former Smoker    Last attempt to quit: 11/15/1991    Years since quitting: 26.3  . Smokeless tobacco: Never Used  Substance Use Topics  . Alcohol use: Yes    Alcohol/week: 1.2 oz    Types: 2 Glasses of wine per week    Comment: 1 glass per week  . Drug use: No    ALLERGIES: Allergies  Allergen Reactions  . Contrast Media [Iodinated Diagnostic Agents] Anaphylaxis    09/19/17-Pt premedicated. Then pt stated she was DNR. Spoke with [name] who did not realize allergy was recorded as anaphylactic. [name] verbally gave ok to change CT exam to without IV contrast. Pt and daughter do not recall occurrence of anaphylactic reaction to contrast media. Geanie Kenning, 3:27pm  . Penicillins Anaphylaxis and Swelling    Has patient had a PCN  reaction causing immediate rash, facial/tongue/throat swelling, SOB or lightheadedness with hypotension: Yes Has patient had a PCN reaction causing severe rash involving mucus membranes or skin necrosis: No Has patient had a PCN reaction that required hospitalization Yes Has patient had a PCN reaction occurring within the last 10 years: No If all of the above answers are "NO", then may proceed with Cephalosporin use.   . Adhesive [Tape] Other (See Comments)    blisters  . Cephalexin Swelling  . Ciprofloxacin Swelling  . Demerol Nausea And Vomiting  . Sulfamethoxazole-Trimethoprim Itching and Swelling    REACTION: swelling/hives  . Quinolones Itching and Rash    REACTION: itching, rash Pt. Reports no problems with levaquin     MEDICATIONS:  Current Outpatient Medications  Medication Sig Dispense Refill  . acetaminophen (TYLENOL) 500 MG tablet Take 1,000 mg by mouth daily as needed for moderate pain. Reported on 03/10/2016    . ALPRAZolam (XANAX) 0.25 MG tablet Take 1/2 tablet in am and 1 tablet prior to bedtime. 45 tablet 2  . aspirin 81 MG chewable tablet Chew 81 mg by mouth once a week. Reported on 03/10/2016    . CALCIUM-MAGNESIUM-VITAMIN D PO Take 1 tablet by mouth 2 (two) times daily.    . cetirizine (ZYRTEC) 10 MG tablet Take 10 mg by mouth as needed for allergies.     . diphenhydrAMINE (BENADRYL) 50 MG tablet Take 1 tablet (50 mg total) by mouth as directed. Take Benadryl 50 mg po 1 hour before CT scan. 1 tablet 0  . doxepin (SINEQUAN) 10 MG capsule Take 10 mg by mouth at bedtime.     . fluconazole (DIFLUCAN) 100 MG tablet Take 1 tablet (100 mg total) by mouth daily. 1 tablet 0  . fluticasone (FLONASE) 50 MCG/ACT nasal spray Place 1 spray into both nostrils daily as needed for allergies.     Marland Kitchen guaiFENesin (MUCINEX) 600 MG 12 hr tablet Take 600 mg by  mouth daily as needed for cough or to loosen phlegm. Reported on 03/28/2016    . losartan (COZAAR) 25 MG tablet Take 25 mg by mouth  daily.   0  . nitroGLYCERIN (NITROSTAT) 0.4 MG SL tablet Place 1 tablet (0.4 mg total) under the tongue every 5 (five) minutes as needed for chest pain. <PLEASE MAKE APPOINTMENT> 25 tablet 3  . potassium chloride (K-DUR) 10 MEQ tablet Take 10 mEq by mouth as needed.     . pseudoephedrine-acetaminophen (TYLENOL SINUS) 30-500 MG TABS tablet Take 1 tablet by mouth every 4 (four) hours as needed.    . triamcinolone (KENALOG) 0.025 % cream Apply 1 application topically 2 (two) times daily. 30 g 0   No current facility-administered medications for this visit.     REVIEW OF SYSTEMS:  A 10 point review of systems was completed and is negative except as noted in HPI.    PHYSICAL EXAMINATION:  BP (!) 141/78 (BP Location: Left Arm, Patient Position: Sitting)   Pulse 80   Temp 98.4 F (36.9 C) (Oral)   Resp 17   Ht 5' 1"  (1.549 m)   Wt 130 lb 9.6 oz (59.2 kg)   SpO2 97%   BMI 24.68 kg/m    GENERAL: Patient is a well appearing female in no acute distress HEENT:  Sclerae anicteric.  Oropharynx clear and moist. No ulcerations or evidence of oropharyngeal candidiasis. Neck is supple.  NODES:   There is no cervical , supraclavicular, or axillary lymphadenopathy palpated.  LUNGS:  Clear to auscultation bilaterally.  No wheezes or rhonchi. HEART:  Regular rate and rhythm. No murmur appreciated.  Chest:  ABDOMEN:  Soft, nontender.  normoactive bowel sounds. No organomegaly palpated.  MSK:  No focal spinal tenderness to palpation. Full range of motion bilaterally in the upper extremities. EXTREMITIES:  No peripheral edema.   SKIN:  Clear with no obvious rashes or skin changes.  NEURO:  Nonfocal. Well oriented.  Appropriate affect. BREAST EXAM: Left breast is surgically absent, incision in the left frontal chest has healed well. Few tiny nodules on the left chest wall of the incision. No other palpable masses noted. Right breast and bilateral axilla notable for no masses or nodules.    ECOG PS:   1   LAB RESULTS: CBC Latest Ref Rng & Units 03/14/2018 01/17/2018 12/20/2017  WBC 3.9 - 10.3 K/uL 3.3(L) 3.1(L) 3.4(L)  Hemoglobin 11.6 - 15.9 g/dL 13.4 13.7 13.1  Hematocrit 34.8 - 46.6 % 39.5 41.5 41.0  Platelets 145 - 400 K/uL 141(L) 151 114(L)      CMP Latest Ref Rng & Units 03/14/2018 01/17/2018 12/20/2017  Glucose 70 - 140 mg/dL 69(L) 84 67(L)  BUN 7 - 26 mg/dL 17 14 13   Creatinine 0.60 - 1.10 mg/dL 0.88 0.90 0.85  Sodium 136 - 145 mmol/L 141 143 142  Potassium 3.5 - 5.1 mmol/L 3.9 3.7 4.0  Chloride 98 - 109 mmol/L 107 106 108  CO2 22 - 29 mmol/L 26 28 25   Calcium 8.4 - 10.4 mg/dL 9.7 9.8 9.4  Total Protein 6.4 - 8.3 g/dL 6.5 6.4 6.3(L)  Total Bilirubin 0.2 - 1.2 mg/dL 0.4 0.6 0.5  Alkaline Phos 40 - 150 U/L 84 70 67  AST 5 - 34 U/L 21 21 20   ALT 0 - 55 U/L 13 13 13    CA antigen 27.29 on 03/21/17 was 32.7  PATHOLOGY REPORT Diagnosis 11/25/2015 Soft tissue mass, simple excision, Left chest wall - INVASIVE LOBULAR CARCINOMA, PRESENT AT  NON ORIENTED TISSUE EDGE(S). - SEE COMMENT. Microscopic Comment The carcinoma appears grade 2. A breast prognostic profile will be performed and the results reported separately. (JDP:gt, 11/26/15).  Results: IMMUNOHISTOCHEMICAL AND MORPHOMETRIC ANALYSIS PERFORMED MANUALLY Estrogen Receptor: 95%, POSITIVE, STRONG STAINING INTENSITY Progesterone Receptor: 60%, POSITIVE, STRONG STAINING INTENSITY Results: HER2 - NEGATIVE RATIO OF HER2/CEP17 SIGNALS 1.07 AVERAGE HER2 COPY NUMBER PER CELL 2.90  Diagnosis 07/17/2016 1. Soft tissue mass, simple excision, Chest wall medial left - INVASIVE LOBULAR CARCINOMA, SEE COMMENT. - NODULE OF FIBROSIS AND CALCIFICATION. 2. Soft tissue mass, simple excision, Chest wall lateral left - INVASIVE LOBULAR CARCINOMA, SEE COMMENT. Microscopic Comment 1. -2. Both parts reveal foci of invasive lobular carcinoma. There is only limited tumor in part #1. Tumor does extend to the uninked edges of both parts. The  carcinoma appears grade 2. A prognostic panel has been ordered on part #2.  2. PROGNOSTIC INDICATORS Results: IMMUNOHISTOCHEMICAL AND MORPHOMETRIC ANALYSIS PERFORMED MANUALLY Estrogen Receptor: 60%, POSITIVE, STRONG STAINING INTENSITY Progesterone Receptor: 90%, POSITIVE, STRONG STAINING INTENSITY Proliferation Marker Ki67: 10%  Results: HER2 - NEGATIVE RATIO OF HER2/CEP17 SIGNALS 1.20 AVERAGE HER2 COPY NUMBER PER CELL 2.40   RADIOGRAPHIC STUDIES: US Abdomen Complete  Result Date: 03/07/2018 CLINICAL DATA:  Chronic left upper quadrant abdominal pain. EXAM: ABDOMEN ULTRASOUND COMPLETE COMPARISON:  CT scan of September 19, 2017. FINDINGS: Gallbladder: No gallstones or wall thickening visualized. No sonographic Murphy sign noted by sonographer. Common bile duct: Diameter: 4 mm which is within normal limits. Liver: No focal lesion identified. Increased echogenicity of hepatic parenchyma concerning for fatty infiltration. Portal vein is patent on color Doppler imaging with normal direction of blood flow towards the liver. IVC: No abnormality visualized. Pancreas: Visualized portion unremarkable. Spleen: Size and appearance within normal limits. Right Kidney: Length: 10.2 cm. Parapelvic cysts are noted. Echogenicity within normal limits. No mass or hydronephrosis visualized. Left Kidney: Length: 10.7 cm. Two simple cysts are noted, with the largest measuring 4.4 cm in upper pole. Echogenicity within normal limits. No mass or hydronephrosis visualized. Abdominal aorta: No aneurysm visualized. Other findings: None. IMPRESSION: Increased echogenicity of hepatic parenchyma is noted concerning for fatty infiltration. Bilateral simple renal cysts are noted. No other significant abnormality seen in the abdomen. Electronically Signed   By: Marijo Conception, M.D.   On: 03/07/2018 15:12   Nm Pet Image Restag (ps) Skull Base To Thigh  Result Date: 03/11/2018 CLINICAL DATA:  Subsequent treatment strategy for breast  cancer. EXAM: NUCLEAR MEDICINE PET SKULL BASE TO THIGH TECHNIQUE: 6.5 mCi F-18 FDG was injected intravenously. Full-ring PET imaging was performed from the skull base to thigh after the radiotracer. CT data was obtained and used for attenuation correction and anatomic localization. Fasting blood glucose: 95 mg/dl COMPARISON:  CT chest abdomen pelvis 09/19/2017 and PET 04/10/2017. FINDINGS: Mediastinal blood pool activity: SUV max 2.3 NECK: No hypermetabolic lymph nodes. Incidental CT findings: None. CHEST: No hypermetabolic mediastinal, hilar or axillary lymph nodes. No hypermetabolic pulmonary nodules. Incidental CT findings: Atherosclerotic calcification of the arterial vasculature. Heart is enlarged. No pericardial effusion. Radiation changes in the lingula. ABDOMEN/PELVIS: No abnormal hypermetabolism in the liver, adrenal glands, spleen or pancreas. No hypermetabolic lymph nodes. Incidental CT findings: Probable cysts in the left kidney, measuring up to 4.5 cm, difficult to definitively characterize without post-contrast imaging. Atherosclerotic calcification of the arterial vasculature without abdominal aortic aneurysm. No free fluid. SKELETON: No abnormal osseous hypermetabolism. Incidental CT findings: Degenerative changes in the spine. No worrisome lytic or sclerotic lesions. IMPRESSION: 1.  No evidence of recurrent disease. 2.  Aortic atherosclerosis (ICD10-170.0). Electronically Signed   By: Lorin Picket M.D.   On: 03/11/2018 11:19     Bone Scan Whole Body 09/19/17  IMPRESSION: No definite scintigraphic evidence of osseous metastatic disease. Scattered degenerative type uptake as above.   CT CAP WO Contrast 09/19/17 IMPRESSION: 1. Surgical changes from a left mastectomy without CT findings to suggest recurrent chest wall tumor or axillary lymphadenopathy. 2. No findings to suggest metastatic disease involving the neck, chest, abdomen or pelvis or osseous structures. 3. Stable radiation  changes involving the lingula.    PET 04/10/17 IMPRESSION: 1. Stable exam.  No findings of recurrent malignancy. 2. Changes of external beam radiation again noted within the lingula. 3. Small left axillary lymph node exhibits mild increased uptake. Unchanged from previous exam.   Mammogram 03/01/2017 IMPRESSION: No mammographic evidence of malignancy. A result letter of this screening mammogram will be mailed directly to the patient. RECOMMENDATION: Screening mammogram in one year   CT chest, abdomen and pelvis wo contrast  07/27/2016 IMPRESSION: Sigmoid diverticulosis without acute diverticulitis. No bowel obstruction or acute inflammation. Degenerative disc disease and facet arthropathy of the lower lumbar spine with slight grade 1 anterolisthesis of L4 on L5. Stable appearing simple left-sided renal cysts.  PET 07/14/2016 IMPRESSION: 1. No findings of recurrent malignancy. 2. Radiation fibrosis in the lingula. A small left axillary lymph node is not hypermetabolic and only 6 mm in diameter. This is in the vicinity of the prior axillary cyst. 3. Pelvic floor laxity.  ASSESSMENT/PLAN: 84 y.o. female with: invasive lobular carcinoma of the left breast   1. Cancer of the central portion of left female breast, Stage IIB, T2 N1 invasive lobular carcinoma, grade 2, ER 95%, PR 97%, Ki-67 17%, HER-2/neu no amplification, diagnosed in 07/2010, local chest wall recurrence in 09/2015   -She was on adjuvant tamoxifen for 4.5 years, stopped on her own. -She unfortunately developed local recurrence 8 months after she stopped tamoxifen. -I previously reviewed her surgical pathology findings, which is consistent with lobular carcinoma recurrence from her previous breast cancer. -The tumor was surgically removed, with positive margins. -Her restaging CT scan and bone scan in 12/2015 was negative for distant metastasis. -She has completed chest wall radiation, however developed 2-3 small  nodules during the radiation and anastrozole therapy, suspicious for residual cancer. US showed a 4 mm and 6 mm subcutaneous nodule, partially calcified, highly suspicious for local recurrence. -She subsequently underwent a second chest wall surgery for two soft tissue resection in 06/2016, I previously reviewed her pathology results, both showed invasive lobular carcinoma, margins were positive. I spoke with Dr. Marlou Starks, no more surgery will be offered, because complete surgical resection is unlikely. Patient is also not in favor of more extensive surgery. -We previously discussed that her cancer is unlikely curable at this stage, and our goal of therapy is palliative for disease control, and prolong her life. She voiced good understanding. -I reviewed her restaging PET scan from 04/10/2017, which was negative for metastatic disease -She has developed more chest wall tenderness, the tumor nodules along the incision line are slightly bigger and tender, I think she has had some disease progression. I have switched her tamoxifen to fulvestrant injection on 06/07/17. -She has been tolerating Faslodex injections well overall, with some pain at injection sites  -Due to possible anaphylactic reaction, she will not receive CT contrast with scans.  -We discussed her CT CAP wo contrast and bone scan from 09/19/17  which shows no bone metastasis and no cancer spread beyond chest wall.  -she is clinically do well overall, still has mild pain at the left chest wall, exam today showed nodules are much smaller now around her incision line, otherwise negative. No clinical concern for disease progression. -PET scan on 03/11/2018 showed: No evidence of recurrent disease. I reviewed with her  -I reviewed the recent imaging and lab studies with the patient today, no concerns  -Lab adequate to proceed with Fulvestrant injection today and continue monthly. -Injection in 4 and 8 weeks -Lab and f/u in 8 weeks    2. HTN, CAD  -  She'll continue follow-up with her primary care physician and cardiologist -Her BP has been elevated in clinic last 2 visit. I encouraged her to monitor her BP at home and if remains elevated she should see her PCP. She is on Losartan.   3. Osteoporosis  - her last DEXA scan in 06/2012 showed osteoporosis  - I previously encouraged her to continue calcium and Vit D - I previously encouraged her to repeat DEXA at her PCP office   4. Left Chest Pain  -Secondary to the small tumor nodules on the chest wall. -Manageable, she cannot take much pain medication.  -Since she has been having continued pain, I did offer her Gabapentin; however, she declined this for now. I encouraged her to take Tylenol and Ibuprofen for further management. I also encouraged her to keep her fluid intake up while taking these medications.  -I previously advised her to regularly stretch her left arm to help with ROM and tightening of her incision location.  -Her pain is manageable, her breast nodules have gotten much smaller.   5. Goal of care discussion  -We again discussed the incurable nature of her cancer, and the overall poor prognosis, especially if he/she does not have good response to chemotherapy or progress on chemo -The patient understands the goal of care is palliative. -I previously recommended DNR/DNI, she will think about it     Plan -Injection in 4 and 8 weeks -Lab and f/u in 8 weeks  -I called her daughter [name] after her clinical visit, and updated her condition    I spent 15 minutes counseling the patient face to face. The total time spent in the appointment was 20 minutes.  This document serves as a record of services personally performed by Truitt Merle, MD. It was created on her behalf by Steva Colder, a trained medical scribe. The creation of this record is based on the scribe's personal observations and the provider's statements to them.   I have reviewed the above documentation for accuracy  and completeness, and I agree with the above.     Truitt Merle  03/14/2018

## 2018-03-14 ENCOUNTER — Other Ambulatory Visit: Payer: PPO

## 2018-03-14 ENCOUNTER — Ambulatory Visit: Payer: PPO

## 2018-03-14 ENCOUNTER — Inpatient Hospital Stay: Payer: PPO

## 2018-03-14 ENCOUNTER — Ambulatory Visit: Payer: PPO | Admitting: Hematology

## 2018-03-14 ENCOUNTER — Inpatient Hospital Stay: Payer: PPO | Attending: Hematology

## 2018-03-14 ENCOUNTER — Inpatient Hospital Stay (HOSPITAL_BASED_OUTPATIENT_CLINIC_OR_DEPARTMENT_OTHER): Payer: PPO | Admitting: Hematology

## 2018-03-14 ENCOUNTER — Encounter: Payer: Self-pay | Admitting: Hematology

## 2018-03-14 VITALS — BP 141/78 | HR 80 | Temp 98.4°F | Resp 17 | Ht 61.0 in | Wt 130.6 lb

## 2018-03-14 DIAGNOSIS — Z800 Family history of malignant neoplasm of digestive organs: Secondary | ICD-10-CM | POA: Diagnosis not present

## 2018-03-14 DIAGNOSIS — I2510 Atherosclerotic heart disease of native coronary artery without angina pectoris: Secondary | ICD-10-CM | POA: Diagnosis not present

## 2018-03-14 DIAGNOSIS — Z7982 Long term (current) use of aspirin: Secondary | ICD-10-CM | POA: Diagnosis not present

## 2018-03-14 DIAGNOSIS — F329 Major depressive disorder, single episode, unspecified: Secondary | ICD-10-CM | POA: Insufficient documentation

## 2018-03-14 DIAGNOSIS — C50112 Malignant neoplasm of central portion of left female breast: Secondary | ICD-10-CM

## 2018-03-14 DIAGNOSIS — I071 Rheumatic tricuspid insufficiency: Secondary | ICD-10-CM

## 2018-03-14 DIAGNOSIS — K219 Gastro-esophageal reflux disease without esophagitis: Secondary | ICD-10-CM | POA: Diagnosis not present

## 2018-03-14 DIAGNOSIS — I700 Atherosclerosis of aorta: Secondary | ICD-10-CM | POA: Diagnosis not present

## 2018-03-14 DIAGNOSIS — Z8042 Family history of malignant neoplasm of prostate: Secondary | ICD-10-CM | POA: Insufficient documentation

## 2018-03-14 DIAGNOSIS — M5136 Other intervertebral disc degeneration, lumbar region: Secondary | ICD-10-CM

## 2018-03-14 DIAGNOSIS — K5730 Diverticulosis of large intestine without perforation or abscess without bleeding: Secondary | ICD-10-CM

## 2018-03-14 DIAGNOSIS — I10 Essential (primary) hypertension: Secondary | ICD-10-CM | POA: Insufficient documentation

## 2018-03-14 DIAGNOSIS — N281 Cyst of kidney, acquired: Secondary | ICD-10-CM

## 2018-03-14 DIAGNOSIS — M810 Age-related osteoporosis without current pathological fracture: Secondary | ICD-10-CM | POA: Diagnosis not present

## 2018-03-14 DIAGNOSIS — Z79899 Other long term (current) drug therapy: Secondary | ICD-10-CM | POA: Diagnosis not present

## 2018-03-14 DIAGNOSIS — Z9012 Acquired absence of left breast and nipple: Secondary | ICD-10-CM | POA: Insufficient documentation

## 2018-03-14 DIAGNOSIS — Z170 Estrogen receptor positive status [ER+]: Secondary | ICD-10-CM | POA: Insufficient documentation

## 2018-03-14 DIAGNOSIS — Z87891 Personal history of nicotine dependence: Secondary | ICD-10-CM

## 2018-03-14 DIAGNOSIS — R0789 Other chest pain: Secondary | ICD-10-CM

## 2018-03-14 DIAGNOSIS — F419 Anxiety disorder, unspecified: Secondary | ICD-10-CM | POA: Diagnosis not present

## 2018-03-14 DIAGNOSIS — Z923 Personal history of irradiation: Secondary | ICD-10-CM | POA: Insufficient documentation

## 2018-03-14 DIAGNOSIS — Z803 Family history of malignant neoplasm of breast: Secondary | ICD-10-CM | POA: Diagnosis not present

## 2018-03-14 DIAGNOSIS — Z79811 Long term (current) use of aromatase inhibitors: Secondary | ICD-10-CM | POA: Insufficient documentation

## 2018-03-14 LAB — COMPREHENSIVE METABOLIC PANEL
ALT: 13 U/L (ref 0–55)
AST: 21 U/L (ref 5–34)
Albumin: 3.7 g/dL (ref 3.5–5.0)
Alkaline Phosphatase: 84 U/L (ref 40–150)
Anion gap: 8 (ref 3–11)
BILIRUBIN TOTAL: 0.4 mg/dL (ref 0.2–1.2)
BUN: 17 mg/dL (ref 7–26)
CALCIUM: 9.7 mg/dL (ref 8.4–10.4)
CO2: 26 mmol/L (ref 22–29)
CREATININE: 0.88 mg/dL (ref 0.60–1.10)
Chloride: 107 mmol/L (ref 98–109)
GFR calc Af Amer: >60 mL/min (ref >60)
GFR, EST NON AFRICAN AMERICAN: 57 mL/min — AB (ref >60)
Glucose, Bld: 69 mg/dL — ABNORMAL LOW (ref 70–140)
POTASSIUM: 3.9 mmol/L (ref 3.5–5.1)
Sodium: 141 mmol/L (ref 136–145)
TOTAL PROTEIN: 6.5 g/dL (ref 6.4–8.3)

## 2018-03-14 LAB — CBC WITH DIFFERENTIAL/PLATELET
BASOS ABS: 0.1 10*3/uL (ref 0.0–0.1)
Basophils Relative: 2 %
EOS ABS: 0.4 10*3/uL (ref 0.0–0.5)
EOS PCT: 12 %
HCT: 39.5 % (ref 34.8–46.6)
Hemoglobin: 13.4 g/dL (ref 11.6–15.9)
Lymphocytes Relative: 16 %
Lymphs Abs: 0.5 10*3/uL — ABNORMAL LOW (ref 0.9–3.3)
MCH: 31.3 pg (ref 25.1–34.0)
MCHC: 33.8 g/dL (ref 31.5–36.0)
MCV: 92.8 fL (ref 79.5–101.0)
Monocytes Absolute: 0.4 10*3/uL (ref 0.1–0.9)
Monocytes Relative: 12 %
Neutro Abs: 1.9 10*3/uL (ref 1.5–6.5)
Neutrophils Relative %: 58 %
PLATELETS: 141 10*3/uL — AB (ref 145–400)
RBC: 4.26 MIL/uL (ref 3.70–5.45)
RDW: 13.3 % (ref 11.2–14.5)
WBC: 3.3 10*3/uL — AB (ref 3.9–10.3)

## 2018-03-14 MED ORDER — FULVESTRANT 250 MG/5ML IM SOLN
INTRAMUSCULAR | Status: AC
  Filled 2018-03-14: qty 10

## 2018-03-14 MED ORDER — FULVESTRANT 250 MG/5ML IM SOLN
500.0000 mg | INTRAMUSCULAR | Status: DC
  Administered 2018-03-14: 500 mg via INTRAMUSCULAR

## 2018-03-14 NOTE — Patient Instructions (Signed)

## 2018-03-15 ENCOUNTER — Telehealth: Payer: Self-pay | Admitting: Hematology

## 2018-03-15 LAB — CANCER ANTIGEN 27.29: CAN 27.29: 31.9 U/mL (ref 0.0–38.6)

## 2018-03-15 NOTE — Telephone Encounter (Signed)
Appointments scheduled Letter/calendar mailed per 5/16 los

## 2018-03-27 DIAGNOSIS — H26492 Other secondary cataract, left eye: Secondary | ICD-10-CM | POA: Diagnosis not present

## 2018-03-28 DIAGNOSIS — H6121 Impacted cerumen, right ear: Secondary | ICD-10-CM | POA: Diagnosis not present

## 2018-04-11 ENCOUNTER — Inpatient Hospital Stay: Payer: PPO | Attending: Hematology

## 2018-04-11 ENCOUNTER — Inpatient Hospital Stay: Payer: PPO

## 2018-04-11 VITALS — BP 140/82 | HR 73 | Temp 98.3°F | Resp 18

## 2018-04-11 DIAGNOSIS — C50112 Malignant neoplasm of central portion of left female breast: Secondary | ICD-10-CM

## 2018-04-11 DIAGNOSIS — Z79818 Long term (current) use of other agents affecting estrogen receptors and estrogen levels: Secondary | ICD-10-CM | POA: Insufficient documentation

## 2018-04-11 DIAGNOSIS — C50111 Malignant neoplasm of central portion of right female breast: Secondary | ICD-10-CM | POA: Insufficient documentation

## 2018-04-11 DIAGNOSIS — Z170 Estrogen receptor positive status [ER+]: Secondary | ICD-10-CM | POA: Diagnosis not present

## 2018-04-11 LAB — COMPREHENSIVE METABOLIC PANEL
ALT: 13 U/L (ref 0–55)
AST: 24 U/L (ref 5–34)
Albumin: 3.9 g/dL (ref 3.5–5.0)
Alkaline Phosphatase: 81 U/L (ref 40–150)
Anion gap: 8 (ref 3–11)
BUN: 18 mg/dL (ref 7–26)
CO2: 27 mmol/L (ref 22–29)
CREATININE: 0.87 mg/dL (ref 0.60–1.10)
Calcium: 9.7 mg/dL (ref 8.4–10.4)
Chloride: 106 mmol/L (ref 98–109)
GFR, EST NON AFRICAN AMERICAN: 58 mL/min — AB (ref >60)
Glucose, Bld: 103 mg/dL (ref 70–140)
POTASSIUM: 3.9 mmol/L (ref 3.5–5.1)
SODIUM: 141 mmol/L (ref 136–145)
Total Bilirubin: 0.7 mg/dL (ref 0.2–1.2)
Total Protein: 6.5 g/dL (ref 6.4–8.3)

## 2018-04-11 LAB — CBC WITH DIFFERENTIAL/PLATELET
BASOS ABS: 0 10*3/uL (ref 0.0–0.1)
Basophils Relative: 1 %
EOS ABS: 0.4 10*3/uL (ref 0.0–0.5)
Eosinophils Relative: 11 %
HCT: 39.5 % (ref 34.8–46.6)
Hemoglobin: 13.1 g/dL (ref 11.6–15.9)
Lymphocytes Relative: 18 %
Lymphs Abs: 0.6 10*3/uL — ABNORMAL LOW (ref 0.9–3.3)
MCH: 31.1 pg (ref 25.1–34.0)
MCHC: 33.2 g/dL (ref 31.5–36.0)
MCV: 93.8 fL (ref 79.5–101.0)
MONO ABS: 0.4 10*3/uL (ref 0.1–0.9)
Monocytes Relative: 13 %
Neutro Abs: 1.7 10*3/uL (ref 1.5–6.5)
Neutrophils Relative %: 57 %
PLATELETS: 130 10*3/uL — AB (ref 145–400)
RBC: 4.21 MIL/uL (ref 3.70–5.45)
RDW: 13.4 % (ref 11.2–14.5)
WBC: 3.1 10*3/uL — AB (ref 3.9–10.3)

## 2018-04-11 MED ORDER — FULVESTRANT 250 MG/5ML IM SOLN
INTRAMUSCULAR | Status: AC
  Filled 2018-04-11: qty 5

## 2018-04-11 MED ORDER — FULVESTRANT 250 MG/5ML IM SOLN
500.0000 mg | INTRAMUSCULAR | Status: DC
  Administered 2018-04-11: 500 mg via INTRAMUSCULAR

## 2018-04-11 NOTE — Patient Instructions (Signed)

## 2018-04-12 LAB — CANCER ANTIGEN 27.29: CAN 27.29: 55.4 U/mL — AB (ref 0.0–38.6)

## 2018-05-01 ENCOUNTER — Other Ambulatory Visit: Payer: Self-pay | Admitting: Nurse Practitioner

## 2018-05-01 ENCOUNTER — Other Ambulatory Visit: Payer: Self-pay | Admitting: Hematology

## 2018-05-01 DIAGNOSIS — C50112 Malignant neoplasm of central portion of left female breast: Secondary | ICD-10-CM

## 2018-05-01 DIAGNOSIS — Z170 Estrogen receptor positive status [ER+]: Principal | ICD-10-CM

## 2018-05-01 MED ORDER — ALPRAZOLAM 0.25 MG PO TABS: ORAL_TABLET | ORAL | 0 refills | Status: DC

## 2018-05-02 ENCOUNTER — Other Ambulatory Visit: Payer: Self-pay | Admitting: Hematology

## 2018-05-02 DIAGNOSIS — C50112 Malignant neoplasm of central portion of left female breast: Secondary | ICD-10-CM

## 2018-05-02 DIAGNOSIS — Z170 Estrogen receptor positive status [ER+]: Principal | ICD-10-CM

## 2018-05-04 ENCOUNTER — Other Ambulatory Visit: Payer: Self-pay | Admitting: Hematology

## 2018-05-04 DIAGNOSIS — C50112 Malignant neoplasm of central portion of left female breast: Secondary | ICD-10-CM

## 2018-05-04 DIAGNOSIS — Z170 Estrogen receptor positive status [ER+]: Principal | ICD-10-CM

## 2018-05-07 NOTE — Progress Notes (Signed)
[place]  [phone]) [phone] Fax:(336) [phone]  OFFICE PROGRESS NOTE  PATIENT: Gloria [name]   DOB: 11/03/1927  MR#: [id number]  TML#:[id number]  [id number], [name], MD [name], MD [name]. [name], MD  Date of Service:  05/09/2018    DIAGNOSIS:  An 84 y.o. [place] woman with invasive lobular carcinoma of the left breast diagnosed in 07/2010, chest wall local recurrence in 09/2015 .  Oncology History   Cancer of central portion of left female breast [facility])   Staging form: Breast, AJCC 7th Edition     Pathologic stage from 08/04/2010: Stage IIB (T2, N1a, cM0) - Signed by Truitt Merle, MD on 12/09/2015       Cancer of central portion of left female breast ([place])   08/03/2010 Mammogram     left nipple retraction, and the palpable mass subareolar left breast at the 6:00 position. Ultrasound confirmed the presence of a mass measuring 2.6 x 1.7 x 2.4 cm      08/04/2010 Initial Biopsy    left breast mass biopsy showed an invasive mammary carcinoma with lobular features. ER 95%, PR 97%, Ki-67 17%, HER-2/neu (-)      08/04/2010 Pathologic Stage    Stage IIB: T2 N1a      08/11/2010 Imaging    left breast mass 4.6 x 4.2 x 2.3 cm with enhancement distortion extending to the nipple retraction. There is questionable anterior mediastinal lymph node seen      07/2010 - 09/03/2010 Anti-estrogen oral therapy    neoadjuvant letrozole       09/03/2010 Surgery     left breast mastectomy with sentinel node biopsy on 09/03/2010.      08/2010 - 01/2015 Anti-estrogen oral therapy    Tamoxifen, pt stopped on her own       12/03/2010 - 01/23/2011 Radiation Therapy    left chest wall and axilla radiaiton       10/19/2015 Progression    Left chest wall recurrence, s/p resection on 11/25/2015 with positive margins ([facility])       12/10/2015 - 05/31/2017 Anti-estrogen oral therapy    anastrozole 38m daily, which was switched to Tamoxifen on 06/30/2016 due to persistent disease.       01/03/2016 - 03/03/2016 Radiation Therapy    Radiation to left chest wall recurrence (12/06/2015-02/04/2016 - 44 Gy), pt progressed through the radiation, and had additional 10 fractions of radiation (02/21/16-03/03/2016 - total 70 Gy to the left chest wall)      07/14/2016 PET scan    PET 07/14/2016 IMPRESSION: 1. No findings of recurrent malignancy. 2. Radiation fibrosis in the lingula. A small left axillary lymph node is not hypermetabolic and only 6 mm in diameter. This is in the vicinity of the prior axillary cyst. 3. Pelvic floor laxity.      07/17/2016 Surgery    Chest wall soft tissue resection for local recurrence      07/17/2016 Pathology Results    Left chest wall two soft tissue resection both showed invasive lobular carcinoma, margins were positive. ER 95% positive, PR 60% positive, HER-2 negative.      07/27/2016 Imaging    CT chest, abdomen and pelvis wo contrast  07/27/2016 IMPRESSION: Sigmoid diverticulosis without acute diverticulitis. No bowel obstruction or acute inflammation. Degenerative disc disease and facet arthropathy of the lower lumbar spine with slight grade 1 anterolisthesis of L4 on L5. Stable appearing simple left-sided renal cysts.      03/01/2017 Mammogram    Mammogram 03/01/2017  IMPRESSION: No mammographic evidence of malignancy. A result letter of this screening mammogram will be mailed directly to the patient. RECOMMENDATION: Screening mammogram in one year      04/10/2017 Imaging    PET IMPRESSION: 1. Stable exam.  No findings of recurrent malignancy. 2. Changes of external beam radiation again noted within the lingula. 3. Small left axillary lymph node exhibits mild increased uptake. Unchanged from previous exam.      06/07/2017 -  Anti-estrogen oral therapy    Stopped Tamoxifen and Began Faslodex IM injection on 06/07/17       09/19/2017 Imaging    CT CAP WO Contrast 09/19/17 IMPRESSION: 1. Surgical changes from a left mastectomy without CT  findings to suggest recurrent chest wall tumor or axillary lymphadenopathy. 2. No findings to suggest metastatic disease involving the neck, chest, abdomen or pelvis or osseous structures. 3. Stable radiation changes involving the lingula.       09/19/2017 Imaging    Bone Scan Whole Body  IMPRESSION: No definite scintigraphic evidence of osseous metastatic disease. Scattered degenerative type uptake as above.      03/11/2018 PET scan    IMPRESSION:  1. No evidence of recurrent disease. 2.  Aortic atherosclerosis        CURRENT THERAPY:  monthly Faslodex injection started on 06/07/17    INTERVAL HISTORY:  Tomasina returns for follow-up and her Faslodex injection. She presents to the clinic today by herself. She notes for the past few weeks she has abdominal issues with diarrhea 1-2 times a day. She notes increased pain in her left chest wall with no change in her breast. She notes prominence of her right clavicle that can constrict when swallowing. She will occasionally cough when she eats. She understands she has been eating heavier foods. She also notes some fatigue but nothing significant.  She takes tylenol nightly to moderately help and does not want to take any narcotics.    PAST MEDICAL HISTORY: Past Medical History:  Diagnosis Date  . Anxiety   . Blood in urine   . CAD (coronary artery disease)    non obstructive by cath  . Cancer [facility])    breast- left  . Depression   . GERD (gastroesophageal reflux disease)   . Hypertension   . Knee fracture   . Personal history of radiation therapy 2017  . TR (tricuspid regurgitation)    mild by Echo 12/2008 EF >55%    PAST SURGICAL HISTORY: Past Surgical History:  Procedure Laterality Date  . ABDOMINAL AORTAGRAM N/A 09/08/2011   Procedure: ABDOMINAL Maxcine Ham;  Surgeon: Troy Sine, MD;  Location: [facility] CATH LAB;  Service: Cardiovascular;  Laterality: N/A;  . ABDOMINAL HYSTERECTOMY    . BREAST EXCISIONAL BIOPSY Left 2017   X3   . BREAST SURGERY  08-30-10   mastectomy  . CARDIAC CATHETERIZATION  08/2011   20% LAD stenosis, 20% diagonal stenosis, 10-20% proximal dominant RCA stenosis  . COLON SURGERY    . LEFT HEART CATHETERIZATION WITH CORONARY ANGIOGRAM N/A 09/08/2011   Procedure: LEFT HEART CATHETERIZATION WITH CORONARY ANGIOGRAM;  Surgeon: Troy Sine, MD;  Location: [facility] CATH LAB;  Service: Cardiovascular;  Laterality: N/A;  . MASTECTOMY    . MINOR BREAST BIOPSY Left 11/25/2015   Procedure: MINOR EXCISION MASS LEFT CHEST WALL;  Surgeon: Autumn Messing III, MD;  Location: [facility];  Service: General;  Laterality: Left;  . MINOR BREAST BIOPSY Left 07/17/2016   Procedure: EXCISION OF 2 LEFT CHEST WALL  MASSES;  Surgeon: Autumn Messing III, MD;  Location: [place];  Service: General;  Laterality: Left;  . TONSILLECTOMY      FAMILY HISTORY: Family History  Problem Relation Age of Onset  . Cancer Father        prostate  . Cancer Sister        breast  . Heart disease Sister   . Breast cancer Sister   . Cancer Brother        pancreatic  . Cancer Maternal Grandmother        colon  . Dementia Mother   . Sudden death Brother   . Heart attack Brother   . Heart attack Brother   . Arthritis Sister   . Breast cancer Maternal Aunt     SOCIAL HISTORY: Social History   Tobacco Use  . Smoking status: Former Smoker    Last attempt to quit: 11/15/1991    Years since quitting: 26.4  . Smokeless tobacco: Never Used  Substance Use Topics  . Alcohol use: Yes    Alcohol/week: 1.2 oz    Types: 2 Glasses of wine per week    Comment: 1 glass per week  . Drug use: No    ALLERGIES: Allergies  Allergen Reactions  . Contrast Media [Iodinated Diagnostic Agents] Anaphylaxis    09/19/17-Pt premedicated. Then pt stated she was DNR. Spoke with [name] who did not realize allergy was recorded as anaphylactic. [name] verbally gave ok to change CT exam to without IV contrast. Pt and daughter do not  recall occurrence of anaphylactic reaction to contrast media. Geanie Kenning, 3:27pm  . Penicillins Anaphylaxis and Swelling    Has patient had a PCN reaction causing immediate rash, facial/tongue/throat swelling, SOB or lightheadedness with hypotension: Yes Has patient had a PCN reaction causing severe rash involving mucus membranes or skin necrosis: No Has patient had a PCN reaction that required hospitalization Yes Has patient had a PCN reaction occurring within the last 10 years: No If all of the above answers are "NO", then may proceed with Cephalosporin use.   . Adhesive [Tape] Other (See Comments)    blisters  . Cephalexin Swelling  . Ciprofloxacin Swelling  . Demerol Nausea And Vomiting  . Sulfamethoxazole-Trimethoprim Itching and Swelling    REACTION: swelling/hives  . Quinolones Itching and Rash    REACTION: itching, rash Pt. Reports no problems with levaquin     MEDICATIONS:  Current Outpatient Medications  Medication Sig Dispense Refill  . acetaminophen (TYLENOL) 500 MG tablet Take 1,000 mg by mouth daily as needed for moderate pain. Reported on 03/10/2016    . ALPRAZolam (XANAX) 0.25 MG tablet TAKE 1/2 (ONE-HALF) TABLET BY MOUTH IN THE MORNING AND 1 AT BEDTIME 45 tablet 2  . aspirin 81 MG chewable tablet Chew 81 mg by mouth once a week. Reported on 03/10/2016    . CALCIUM-MAGNESIUM-VITAMIN D PO Take 1 tablet by mouth 2 (two) times daily.    . cetirizine (ZYRTEC) 10 MG tablet Take 10 mg by mouth as needed for allergies.     . diphenhydrAMINE (BENADRYL) 50 MG tablet Take 1 tablet (50 mg total) by mouth as directed. Take Benadryl 50 mg po 1 hour before CT scan. 1 tablet 0  . doxepin (SINEQUAN) 10 MG capsule Take 10 mg by mouth at bedtime.     . fluconazole (DIFLUCAN) 100 MG tablet Take 1 tablet (100 mg total) by mouth daily. 1 tablet 0  . fluticasone (FLONASE) 50 MCG/ACT  nasal spray Place 1 spray into both nostrils daily as needed for allergies.     Marland Kitchen guaiFENesin (MUCINEX) 600  MG 12 hr tablet Take 600 mg by mouth daily as needed for cough or to loosen phlegm. Reported on 03/28/2016    . losartan (COZAAR) 25 MG tablet Take 25 mg by mouth daily.   0  . nitroGLYCERIN (NITROSTAT) 0.4 MG SL tablet Place 1 tablet (0.4 mg total) under the tongue every 5 (five) minutes as needed for chest pain. <PLEASE MAKE APPOINTMENT> 25 tablet 3  . potassium chloride (K-DUR) 10 MEQ tablet Take 10 mEq by mouth as needed.     . pseudoephedrine-acetaminophen (TYLENOL SINUS) 30-500 MG TABS tablet Take 1 tablet by mouth every 4 (four) hours as needed.    . triamcinolone (KENALOG) 0.025 % cream Apply 1 application topically 2 (two) times daily. 30 g 0  . lidocaine-prilocaine (EMLA) cream Apply 1 application topically 2 (two) times daily as needed. 30 g 1   No current facility-administered medications for this visit.     REVIEW OF SYSTEMS:  A 10 point review of systems was completed and is negative except as noted in HPI.    PHYSICAL EXAMINATION:  BP 138/76 (BP Location: Left Arm, Patient Position: Sitting)   Pulse 73   Temp 98.1 F (36.7 C) (Oral)   Resp 20   Ht 5' 1"  (1.549 m)   Wt 132 lb 1.6 oz (59.9 kg)   SpO2 98%   BMI 24.96 kg/m    GENERAL: Patient is a well appearing female in no acute distress HEENT:  Sclerae anicteric.  Oropharynx clear and moist. No ulcerations or evidence of oropharyngeal candidiasis. Neck is supple.  NODES:   There is no cervical , supraclavicular, or axillary lymphadenopathy palpated.  LUNGS:  Clear to auscultation bilaterally.  No wheezes or rhonchi. HEART:  Regular rate and rhythm. No murmur appreciated.  Chest:  ABDOMEN:  Soft, nontender.  normoactive bowel sounds. No organomegaly palpated.  MSK:  No focal spinal tenderness to palpation. Full range of motion bilaterally in the upper extremities. EXTREMITIES:  No peripheral edema.   SKIN:  Clear with no obvious rashes or skin changes.  NEURO:  Nonfocal. Well oriented.  Appropriate affect. BREAST  EXAM: S/p Left breast mastectomy: Left breast is surgically absent, incision in the left frontal chest has healed well. Few tiny nodules on the left chest wall of the incision are barely palpable now. No other palpable masses noted, (+) excessive soft tissue fullness along her incision. Right breast and bilateral axilla notable for no masses or nodules.    ECOG PS:  1   LAB RESULTS: CBC Latest Ref Rng & Units 05/09/2018 04/11/2018 03/14/2018  WBC 3.9 - 10.3 K/uL 3.5(L) 3.1(L) 3.3(L)  Hemoglobin 11.6 - 15.9 g/dL 13.3 13.1 13.4  Hematocrit 34.8 - 46.6 % 40.1 39.5 39.5  Platelets 145 - 400 K/uL 133(L) 130(L) 141(L)      CMP Latest Ref Rng & Units 05/09/2018 04/11/2018 03/14/2018  Glucose 70 - 99 mg/dL 113(H) 103 69(L)  BUN 8 - 23 mg/dL 21 18 17   Creatinine 0.44 - 1.00 mg/dL 0.88 0.87 0.88  Sodium 135 - 145 mmol/L 143 141 141  Potassium 3.5 - 5.1 mmol/L 3.6 3.9 3.9  Chloride 98 - 111 mmol/L 107 106 107  CO2 22 - 32 mmol/L 28 27 26   Calcium 8.9 - 10.3 mg/dL 9.5 9.7 9.7  Total Protein 6.5 - 8.1 g/dL 6.5 6.5 6.5  Total Bilirubin 0.3 -  1.2 mg/dL 0.5 0.7 0.4  Alkaline Phos 38 - 126 U/L 82 81 84  AST 15 - 41 U/L 21 24 21   ALT 0 - 44 U/L 13 13 13    CA antigen 27.29 on 03/21/17 was 32.7  PATHOLOGY REPORT Diagnosis 11/25/2015 Soft tissue mass, simple excision, Left chest wall - INVASIVE LOBULAR CARCINOMA, PRESENT AT NON ORIENTED TISSUE EDGE(S). - SEE COMMENT. Microscopic Comment The carcinoma appears grade 2. A breast prognostic profile will be performed and the results reported separately. (JDP:gt, 11/26/15).  Results: IMMUNOHISTOCHEMICAL AND MORPHOMETRIC ANALYSIS PERFORMED MANUALLY Estrogen Receptor: 95%, POSITIVE, STRONG STAINING INTENSITY Progesterone Receptor: 60%, POSITIVE, STRONG STAINING INTENSITY Results: HER2 - NEGATIVE RATIO OF HER2/CEP17 SIGNALS 1.07 AVERAGE HER2 COPY NUMBER PER CELL 2.90  Diagnosis 07/17/2016 1. Soft tissue mass, simple excision, Chest wall medial left -  INVASIVE LOBULAR CARCINOMA, SEE COMMENT. - NODULE OF FIBROSIS AND CALCIFICATION. 2. Soft tissue mass, simple excision, Chest wall lateral left - INVASIVE LOBULAR CARCINOMA, SEE COMMENT. Microscopic Comment 1. -2. Both parts reveal foci of invasive lobular carcinoma. There is only limited tumor in part #1. Tumor does extend to the uninked edges of both parts. The carcinoma appears grade 2. A prognostic panel has been ordered on part #2.  2. PROGNOSTIC INDICATORS Results: IMMUNOHISTOCHEMICAL AND MORPHOMETRIC ANALYSIS PERFORMED MANUALLY Estrogen Receptor: 60%, POSITIVE, STRONG STAINING INTENSITY Progesterone Receptor: 90%, POSITIVE, STRONG STAINING INTENSITY Proliferation Marker Ki67: 10%  Results: HER2 - NEGATIVE RATIO OF HER2/CEP17 SIGNALS 1.20 AVERAGE HER2 COPY NUMBER PER CELL 2.40   RADIOGRAPHIC STUDIES: No results found.   Bone Scan Whole Body 09/19/17  IMPRESSION: No definite scintigraphic evidence of osseous metastatic disease. Scattered degenerative type uptake as above.   CT CAP WO Contrast 09/19/17 IMPRESSION: 1. Surgical changes from a left mastectomy without CT findings to suggest recurrent chest wall tumor or axillary lymphadenopathy. 2. No findings to suggest metastatic disease involving the neck, chest, abdomen or pelvis or osseous structures. 3. Stable radiation changes involving the lingula.    PET 04/10/17 IMPRESSION: 1. Stable exam.  No findings of recurrent malignancy. 2. Changes of external beam radiation again noted within the lingula. 3. Small left axillary lymph node exhibits mild increased uptake. Unchanged from previous exam.   Mammogram 03/01/2017 IMPRESSION: No mammographic evidence of malignancy. A result letter of this screening mammogram will be mailed directly to the patient. RECOMMENDATION: Screening mammogram in one year   CT chest, abdomen and pelvis wo contrast  07/27/2016 IMPRESSION: Sigmoid diverticulosis without acute  diverticulitis. No bowel obstruction or acute inflammation. Degenerative disc disease and facet arthropathy of the lower lumbar spine with slight grade 1 anterolisthesis of L4 on L5. Stable appearing simple left-sided renal cysts.  PET 07/14/2016 IMPRESSION: 1. No findings of recurrent malignancy. 2. Radiation fibrosis in the lingula. A small left axillary lymph node is not hypermetabolic and only 6 mm in diameter. This is in the vicinity of the prior axillary cyst. 3. Pelvic floor laxity.  ASSESSMENT/PLAN: 84 y.o. female with: invasive lobular carcinoma of the left breast   1. Cancer of the central portion of left female breast, Stage IIB, T2 N1 invasive lobular carcinoma, grade 2, ER 95%, PR 97%, Ki-67 17%, HER-2/neu no amplification, diagnosed in 07/2010, local chest wall recurrence in 09/2015   -She was on adjuvant tamoxifen for 4.5 years, stopped on her own. -She unfortunately developed local recurrence 8 months after she stopped tamoxifen. -I previously reviewed her surgical pathology findings, which is consistent with lobular carcinoma recurrence from her previous breast  cancer. -The tumor was surgically removed, with positive margins. -Her restaging CT scan and bone scan in 12/2015 was negative for distant metastasis. -She has completed chest wall radiation, however developed 2-3 small nodules during the radiation and anastrozole therapy, suspicious for residual cancer. US showed a 4 mm and 6 mm subcutaneous nodule, partially calcified, highly suspicious for local recurrence. -She subsequently underwent a second chest wall surgery for two soft tissue resection in 06/2016, I previously reviewed her pathology results, both showed invasive lobular carcinoma, margins were positive. I spoke with Dr. Marlou Starks, no more surgery will be offered, because complete surgical resection is unlikely. Patient is also not in favor of more extensive surgery. -We previously discussed that her cancer is unlikely  curable at this stage, and our goal of therapy is palliative for disease control, and prolong her life. She voiced good understanding. -I previously reviewed her restaging PET scan from 04/10/2017, which was negative for metastatic disease -She has developed more chest wall tenderness, the tumor nodules along the incision line are slightly bigger and tender, I think she has had some disease progression. I have switched her tamoxifen to fulvestrant injection on 06/07/17. -She has been tolerating Faslodex injections well overall, with some pain at injection sites  -Due to possible anaphylactic reaction, she will not receive CT contrast with scans. We previously discussed her CT CAP wo contrast and bone scan from 09/19/17 which shows no bone metastasis and no cancer spread beyond chest wall.  -PET scan on 03/11/2018 showed: No evidence of recurrent disease. I reviewed with her  -For her recent abdominal issues and mild diarrhea I recommend she goes on a more bland diet, she is agreeable. Will monitor.   -She is clinically doing well overall, still has mild to moderate pain at the left chest wall, exam today showed nodules are barely palpable now around her incision line, otherwise negative. No clinical concern for disease progression. -I discussed the use of topical EMLA cream to help numb her chest wall and reduce her pain. She is interested. I filled today.  -Lab adequate to proceed with Fulvestrant injection today and continue monthly. -F/u in 8 weeks    2. HTN, CAD   -She'll continue follow-up with her primary care physician and cardiologist -I previously encouraged her to monitor her BP at home and if remains elevated she should see her PCP. She is on Losartan.   3. Osteoporosis  - her last DEXA scan in 06/2012 showed osteoporosis  - I previously encouraged her to continue calcium and Vit D - I previously encouraged her to repeat DEXA at her PCP office   4. Left Chest Pain  -Secondary to the  small tumor nodules on the chest wall. -Since she has been having continued pain, I did offer her Gabapentin; however, she declined this for now. I encouraged her to take Tylenol and Ibuprofen for further management. I also encouraged her to keep her fluid intake up while taking these medications.  -I previously advised her to regularly stretch her left arm to help with ROM and tightening of her incision location.  -Her pain has increased lately, I advised her to continue nightly tylenol and take another in the morning.  -I prescribed her topical EMLA cream today (05/09/18) to use 1-2 times a day along her left chest wall to help further reduce her pain.  -Her latest PET scan was negative for disease, and today's exam showed nodules along breast incision are barely palpable.   5. Goal  of care discussion  -We again discussed the incurable nature of her cancer, and the overall poor prognosis, especially if he/she does not have good response to chemotherapy or progress on chemo -The patient understands the goal of care is palliative. -I previously recommended DNR/DNI, she will think about it     Plan -I prescribed her EMLA cream today for her left chest wall pain  -Labs reviewed and adequate to proceed with Fulvestrant injection today  -Injection in 4 and 8 weeks  -Lab and f/u in 8 weeks  -I will call her daughter [name] after her clinical visit, and updated her condition   I spent 20 minutes counseling the patient face to face. The total time spent in the appointment was 25 minutes.  Oneal Deputy, am acting as scribe for Truitt Merle, MD.   I have reviewed the above documentation for accuracy and completeness, and I agree with the above.    Truitt Merle  05/09/2018

## 2018-05-09 ENCOUNTER — Telehealth: Payer: Self-pay

## 2018-05-09 ENCOUNTER — Inpatient Hospital Stay (HOSPITAL_BASED_OUTPATIENT_CLINIC_OR_DEPARTMENT_OTHER): Payer: PPO | Admitting: Hematology

## 2018-05-09 ENCOUNTER — Inpatient Hospital Stay: Payer: PPO

## 2018-05-09 ENCOUNTER — Encounter

## 2018-05-09 ENCOUNTER — Encounter: Payer: Self-pay | Admitting: Hematology

## 2018-05-09 ENCOUNTER — Inpatient Hospital Stay: Payer: PPO | Attending: Hematology

## 2018-05-09 VITALS — BP 138/76 | HR 73 | Temp 98.1°F | Resp 20 | Ht 61.0 in | Wt 132.1 lb

## 2018-05-09 DIAGNOSIS — Z79818 Long term (current) use of other agents affecting estrogen receptors and estrogen levels: Secondary | ICD-10-CM | POA: Insufficient documentation

## 2018-05-09 DIAGNOSIS — Z7982 Long term (current) use of aspirin: Secondary | ICD-10-CM | POA: Diagnosis not present

## 2018-05-09 DIAGNOSIS — I2510 Atherosclerotic heart disease of native coronary artery without angina pectoris: Secondary | ICD-10-CM | POA: Diagnosis not present

## 2018-05-09 DIAGNOSIS — Z803 Family history of malignant neoplasm of breast: Secondary | ICD-10-CM | POA: Diagnosis not present

## 2018-05-09 DIAGNOSIS — M5136 Other intervertebral disc degeneration, lumbar region: Secondary | ICD-10-CM | POA: Diagnosis not present

## 2018-05-09 DIAGNOSIS — Z170 Estrogen receptor positive status [ER+]: Secondary | ICD-10-CM | POA: Insufficient documentation

## 2018-05-09 DIAGNOSIS — R0789 Other chest pain: Secondary | ICD-10-CM | POA: Insufficient documentation

## 2018-05-09 DIAGNOSIS — Z923 Personal history of irradiation: Secondary | ICD-10-CM | POA: Insufficient documentation

## 2018-05-09 DIAGNOSIS — K219 Gastro-esophageal reflux disease without esophagitis: Secondary | ICD-10-CM | POA: Diagnosis not present

## 2018-05-09 DIAGNOSIS — I10 Essential (primary) hypertension: Secondary | ICD-10-CM | POA: Insufficient documentation

## 2018-05-09 DIAGNOSIS — Z8042 Family history of malignant neoplasm of prostate: Secondary | ICD-10-CM | POA: Insufficient documentation

## 2018-05-09 DIAGNOSIS — C50112 Malignant neoplasm of central portion of left female breast: Secondary | ICD-10-CM

## 2018-05-09 DIAGNOSIS — F418 Other specified anxiety disorders: Secondary | ICD-10-CM | POA: Diagnosis not present

## 2018-05-09 DIAGNOSIS — M810 Age-related osteoporosis without current pathological fracture: Secondary | ICD-10-CM | POA: Insufficient documentation

## 2018-05-09 DIAGNOSIS — Z79899 Other long term (current) drug therapy: Secondary | ICD-10-CM | POA: Diagnosis not present

## 2018-05-09 DIAGNOSIS — Z800 Family history of malignant neoplasm of digestive organs: Secondary | ICD-10-CM | POA: Diagnosis not present

## 2018-05-09 DIAGNOSIS — N281 Cyst of kidney, acquired: Secondary | ICD-10-CM | POA: Insufficient documentation

## 2018-05-09 DIAGNOSIS — Z87891 Personal history of nicotine dependence: Secondary | ICD-10-CM | POA: Diagnosis not present

## 2018-05-09 DIAGNOSIS — R918 Other nonspecific abnormal finding of lung field: Secondary | ICD-10-CM | POA: Diagnosis not present

## 2018-05-09 DIAGNOSIS — I700 Atherosclerosis of aorta: Secondary | ICD-10-CM | POA: Insufficient documentation

## 2018-05-09 DIAGNOSIS — Z9012 Acquired absence of left breast and nipple: Secondary | ICD-10-CM | POA: Diagnosis not present

## 2018-05-09 LAB — CBC WITH DIFFERENTIAL/PLATELET
Basophils Absolute: 0 10*3/uL (ref 0.0–0.1)
Basophils Relative: 1 %
Eosinophils Absolute: 0.3 10*3/uL (ref 0.0–0.5)
Eosinophils Relative: 10 %
HEMATOCRIT: 40.1 % (ref 34.8–46.6)
HEMOGLOBIN: 13.3 g/dL (ref 11.6–15.9)
LYMPHS PCT: 20 %
Lymphs Abs: 0.7 10*3/uL — ABNORMAL LOW (ref 0.9–3.3)
MCH: 31.1 pg (ref 25.1–34.0)
MCHC: 33.2 g/dL (ref 31.5–36.0)
MCV: 93.9 fL (ref 79.5–101.0)
MONO ABS: 0.3 10*3/uL (ref 0.1–0.9)
MONOS PCT: 7 %
NEUTROS ABS: 2.2 10*3/uL (ref 1.5–6.5)
NEUTROS PCT: 62 %
Platelets: 133 10*3/uL — ABNORMAL LOW (ref 145–400)
RBC: 4.27 MIL/uL (ref 3.70–5.45)
RDW: 13.3 % (ref 11.2–14.5)
WBC: 3.5 10*3/uL — ABNORMAL LOW (ref 3.9–10.3)

## 2018-05-09 LAB — COMPREHENSIVE METABOLIC PANEL
ALK PHOS: 82 U/L (ref 38–126)
ALT: 13 U/L (ref 0–44)
ANION GAP: 8 (ref 5–15)
AST: 21 U/L (ref 15–41)
Albumin: 3.8 g/dL (ref 3.5–5.0)
BILIRUBIN TOTAL: 0.5 mg/dL (ref 0.3–1.2)
BUN: 21 mg/dL (ref 8–23)
CALCIUM: 9.5 mg/dL (ref 8.9–10.3)
CO2: 28 mmol/L (ref 22–32)
Chloride: 107 mmol/L (ref 98–111)
Creatinine, Ser: 0.88 mg/dL (ref 0.44–1.00)
GFR calc Af Amer: >60 mL/min (ref >60)
GFR, EST NON AFRICAN AMERICAN: 57 mL/min — AB (ref >60)
GLUCOSE: 113 mg/dL — AB (ref 70–99)
POTASSIUM: 3.6 mmol/L (ref 3.5–5.1)
Sodium: 143 mmol/L (ref 135–145)
TOTAL PROTEIN: 6.5 g/dL (ref 6.5–8.1)

## 2018-05-09 MED ORDER — LIDOCAINE-PRILOCAINE 2.5-2.5 % EX CREA: 1.0000 "application " | TOPICAL_CREAM | Freq: Two times a day (BID) | CUTANEOUS | 1 refills | Status: DC | PRN

## 2018-05-09 MED ORDER — FULVESTRANT 250 MG/5ML IM SOLN
INTRAMUSCULAR | Status: AC
  Filled 2018-05-09: qty 5

## 2018-05-09 MED ORDER — FULVESTRANT 250 MG/5ML IM SOLN
500.0000 mg | INTRAMUSCULAR | Status: DC
  Administered 2018-05-09: 500 mg via INTRAMUSCULAR

## 2018-05-09 NOTE — Patient Instructions (Signed)

## 2018-05-09 NOTE — Telephone Encounter (Signed)
Printed avs and calender of upcoming appointment. Per 7/11 los

## 2018-05-09 NOTE — Telephone Encounter (Signed)
Error opening  

## 2018-05-10 LAB — CANCER ANTIGEN 27.29: CA 27.29: 57.3 U/mL — ABNORMAL HIGH (ref 0.0–38.6)

## 2018-05-18 DIAGNOSIS — H43392 Other vitreous opacities, left eye: Secondary | ICD-10-CM | POA: Diagnosis not present

## 2018-05-18 DIAGNOSIS — H43812 Vitreous degeneration, left eye: Secondary | ICD-10-CM | POA: Diagnosis not present

## 2018-06-06 ENCOUNTER — Inpatient Hospital Stay: Payer: PPO | Attending: Hematology

## 2018-06-06 VITALS — BP 129/81 | HR 80 | Resp 18

## 2018-06-06 DIAGNOSIS — Z79818 Long term (current) use of other agents affecting estrogen receptors and estrogen levels: Secondary | ICD-10-CM | POA: Diagnosis not present

## 2018-06-06 DIAGNOSIS — C50112 Malignant neoplasm of central portion of left female breast: Secondary | ICD-10-CM | POA: Diagnosis not present

## 2018-06-06 DIAGNOSIS — Z170 Estrogen receptor positive status [ER+]: Secondary | ICD-10-CM | POA: Diagnosis not present

## 2018-06-06 MED ORDER — FULVESTRANT 250 MG/5ML IM SOLN
INTRAMUSCULAR | Status: AC
  Filled 2018-06-06: qty 10

## 2018-06-06 MED ORDER — FULVESTRANT 250 MG/5ML IM SOLN
500.0000 mg | INTRAMUSCULAR | Status: DC
  Administered 2018-06-06: 500 mg via INTRAMUSCULAR

## 2018-07-03 NOTE — Progress Notes (Signed)
[place]  Telephone:(336) [phone] Fax:(336) [phone]  OFFICE PROGRESS NOTE  PATIENT: Gloria [name]   DOB: Apr 18, 1928  MR#: [id number]  UTM#:[id number]  FK:[id number], [name], MD [name], MD [name]. [name], MD  Date of Service:  07/04/2018    DIAGNOSIS:  An 84 y.o. [place] woman with invasive lobular carcinoma of the left breast diagnosed in 07/2010, chest wall local recurrence in 09/2015 .  Oncology History   Cancer of central portion of left female breast [facility])   Staging form: Breast, AJCC 7th Edition     Pathologic stage from 08/04/2010: Stage IIB (T2, N1a, cM0) - Signed by Truitt Merle, MD on 12/09/2015       Cancer of central portion of left female breast ([place])   08/03/2010 Mammogram     left nipple retraction, and the palpable mass subareolar left breast at the 6:00 position. Ultrasound confirmed the presence of a mass measuring 2.6 x 1.7 x 2.4 cm    08/04/2010 Initial Biopsy    left breast mass biopsy showed an invasive mammary carcinoma with lobular features. ER 95%, PR 97%, Ki-67 17%, HER-2/neu (-)    08/04/2010 Pathologic Stage    Stage IIB: T2 N1a    08/11/2010 Imaging    left breast mass 4.6 x 4.2 x 2.3 cm with enhancement distortion extending to the nipple retraction. There is questionable anterior mediastinal lymph node seen    07/2010 - 09/03/2010 Anti-estrogen oral therapy    neoadjuvant letrozole     09/03/2010 Surgery     left breast mastectomy with sentinel node biopsy on 09/03/2010.    08/2010 - 01/2015 Anti-estrogen oral therapy    Tamoxifen, pt stopped on her own     12/03/2010 - 01/23/2011 Radiation Therapy    left chest wall and axilla radiaiton     10/19/2015 Progression    Left chest wall recurrence, s/p resection on 11/25/2015 with positive margins (IL[facility])     12/10/2015 - 05/31/2017 Anti-estrogen oral therapy    anastrozole '1mg'$  daily, which was switched to Tamoxifen on 06/30/2016 due to persistent disease.    01/03/2016 - 03/03/2016  Radiation Therapy    Radiation to left chest wall recurrence (12/06/2015-02/04/2016 - 44 Gy), pt progressed through the radiation, and had additional 10 fractions of radiation (02/21/16-03/03/2016 - total 70 Gy to the left chest wall)    07/14/2016 PET scan    PET 07/14/2016 IMPRESSION: 1. No findings of recurrent malignancy. 2. Radiation fibrosis in the lingula. A small left axillary lymph node is not hypermetabolic and only 6 mm in diameter. This is in the vicinity of the prior axillary cyst. 3. Pelvic floor laxity.    07/17/2016 Surgery    Chest wall soft tissue resection for local recurrence    07/17/2016 Pathology Results    Left chest wall two soft tissue resection both showed invasive lobular carcinoma, margins were positive. ER 95% positive, PR 60% positive, HER-2 negative.    07/27/2016 Imaging    CT chest, abdomen and pelvis wo contrast  07/27/2016 IMPRESSION: Sigmoid diverticulosis without acute diverticulitis. No bowel obstruction or acute inflammation. Degenerative disc disease and facet arthropathy of the lower lumbar spine with slight grade 1 anterolisthesis of L4 on L5. Stable appearing simple left-sided renal cysts.    03/01/2017 Mammogram    Mammogram 03/01/2017 IMPRESSION: No mammographic evidence of malignancy. A result letter of this screening mammogram will be mailed directly to the patient. RECOMMENDATION: Screening mammogram in one year    04/10/2017  Imaging    PET IMPRESSION: 1. Stable exam.  No findings of recurrent malignancy. 2. Changes of external beam radiation again noted within the lingula. 3. Small left axillary lymph node exhibits mild increased uptake. Unchanged from previous exam.    06/07/2017 -  Anti-estrogen oral therapy    Stopped Tamoxifen and Began Faslodex IM injection on 06/07/17     09/19/2017 Imaging    CT CAP WO Contrast 09/19/17 IMPRESSION: 1. Surgical changes from a left mastectomy without CT findings to suggest recurrent chest wall tumor or  axillary lymphadenopathy. 2. No findings to suggest metastatic disease involving the neck, chest, abdomen or pelvis or osseous structures. 3. Stable radiation changes involving the lingula.     09/19/2017 Imaging    Bone Scan Whole Body  IMPRESSION: No definite scintigraphic evidence of osseous metastatic disease. Scattered degenerative type uptake as above.    03/11/2018 PET scan    IMPRESSION:  1. No evidence of recurrent disease. 2.  Aortic atherosclerosis      CURRENT THERAPY:  monthly Faslodex injection started on 06/07/17    INTERVAL HISTORY:  Gloria [name] returns for follow-up. She is here alone at the clinic. She still has some left midaxillary chest pain. She also has a skin lesion on the lsides of her lips. She tries topical antibiotics without relief.  She is tolerating the injections well with minimal side effects.    PAST MEDICAL HISTORY: Past Medical History:  Diagnosis Date  . Anxiety   . Blood in urine   . CAD (coronary artery disease)    non obstructive by cath  . Cancer [facility])    breast- left  . Depression   . GERD (gastroesophageal reflux disease)   . Hypertension   . Knee fracture   . Personal history of radiation therapy 2017  . TR (tricuspid regurgitation)    mild by Echo 12/2008 EF >55%    PAST SURGICAL HISTORY: Past Surgical History:  Procedure Laterality Date  . ABDOMINAL AORTAGRAM N/A 09/08/2011   Procedure: ABDOMINAL Maxcine Ham;  Surgeon: Troy Sine, MD;  Location: [facility] CATH LAB;  Service: Cardiovascular;  Laterality: N/A;  . ABDOMINAL HYSTERECTOMY    . BREAST EXCISIONAL BIOPSY Left 2017   X3  . BREAST SURGERY  08-30-10   mastectomy  . CARDIAC CATHETERIZATION  08/2011   20% LAD stenosis, 20% diagonal stenosis, 10-20% proximal dominant RCA stenosis  . COLON SURGERY    . LEFT HEART CATHETERIZATION WITH CORONARY ANGIOGRAM N/A 09/08/2011   Procedure: LEFT HEART CATHETERIZATION WITH CORONARY ANGIOGRAM;  Surgeon: Troy Sine, MD;  Location: [facility] CATH  LAB;  Service: Cardiovascular;  Laterality: N/A;  . MASTECTOMY    . MINOR BREAST BIOPSY Left 11/25/2015   Procedure: MINOR EXCISION MASS LEFT CHEST WALL;  Surgeon: Autumn Messing III, MD;  Location: [place];  Service: General;  Laterality: Left;  . MINOR BREAST BIOPSY Left 07/17/2016   Procedure: EXCISION OF 2 LEFT CHEST WALL MASSES;  Surgeon: Autumn Messing III, MD;  Location: [place];  Service: General;  Laterality: Left;  . TONSILLECTOMY      FAMILY HISTORY: Family History  Problem Relation Age of Onset  . Cancer Father        prostate  . Cancer Sister        breast  . Heart disease Sister   . Breast cancer Sister   . Cancer Brother        pancreatic  . Cancer Maternal Grandmother  colon  . Dementia Mother   . Sudden death Brother   . Heart attack Brother   . Heart attack Brother   . Arthritis Sister   . Breast cancer Maternal Aunt     SOCIAL HISTORY: Social History   Tobacco Use  . Smoking status: Former Smoker    Last attempt to quit: 11/15/1991    Years since quitting: 26.6  . Smokeless tobacco: Never Used  Substance Use Topics  . Alcohol use: Yes    Alcohol/week: 2.0 standard drinks    Types: 2 Glasses of wine per week    Comment: 1 glass per week  . Drug use: No    ALLERGIES: Allergies  Allergen Reactions  . Contrast Media [Iodinated Diagnostic Agents] Anaphylaxis    09/19/17-Pt premedicated. Then pt stated she was DNR. Spoke with [name] who did not realize allergy was recorded as anaphylactic. [name] verbally gave ok to change CT exam to without IV contrast. Pt and daughter do not recall occurrence of anaphylactic reaction to contrast media. Geanie Kenning, 3:27pm  . Penicillins Anaphylaxis and Swelling    Has patient had a PCN reaction causing immediate rash, facial/tongue/throat swelling, SOB or lightheadedness with hypotension: Yes Has patient had a PCN reaction causing severe rash involving mucus membranes or skin  necrosis: No Has patient had a PCN reaction that required hospitalization Yes Has patient had a PCN reaction occurring within the last 10 years: No If all of the above answers are "NO", then may proceed with Cephalosporin use.   . Adhesive [Tape] Other (See Comments)    blisters  . Cephalexin Swelling  . Ciprofloxacin Swelling  . Demerol Nausea And Vomiting  . Sulfamethoxazole-Trimethoprim Itching and Swelling    REACTION: swelling/hives  . Quinolones Itching and Rash    REACTION: itching, rash Pt. Reports no problems with levaquin     MEDICATIONS:  Current Outpatient Medications  Medication Sig Dispense Refill  . acetaminophen (TYLENOL) 500 MG tablet Take 1,000 mg by mouth daily as needed for moderate pain. Reported on 03/10/2016    . ALPRAZolam (XANAX) 0.25 MG tablet TAKE 1/2 (ONE-HALF) TABLET BY MOUTH IN THE MORNING AND 1 AT BEDTIME 45 tablet 2  . aspirin 81 MG chewable tablet Chew 81 mg by mouth once a week. Reported on 03/10/2016    . CALCIUM-MAGNESIUM-VITAMIN D PO Take 1 tablet by mouth 2 (two) times daily.    . cetirizine (ZYRTEC) 10 MG tablet Take 10 mg by mouth as needed for allergies.     . diphenhydrAMINE (BENADRYL) 50 MG tablet Take 1 tablet (50 mg total) by mouth as directed. Take Benadryl 50 mg po 1 hour before CT scan. 1 tablet 0  . doxepin (SINEQUAN) 10 MG capsule Take 10 mg by mouth at bedtime.     . fluconazole (DIFLUCAN) 100 MG tablet Take 1 tablet (100 mg total) by mouth daily. 1 tablet 0  . fluticasone (FLONASE) 50 MCG/ACT nasal spray Place 1 spray into both nostrils daily as needed for allergies.     Marland Kitchen guaiFENesin (MUCINEX) 600 MG 12 hr tablet Take 600 mg by mouth daily as needed for cough or to loosen phlegm. Reported on 03/28/2016    . lidocaine-prilocaine (EMLA) cream Apply 1 application topically 2 (two) times daily as needed. 30 g 1  . losartan (COZAAR) 25 MG tablet Take 25 mg by mouth daily.   0  . nitroGLYCERIN (NITROSTAT) 0.4 MG SL tablet Place 1 tablet  (0.4 mg total) under the tongue  every 5 (five) minutes as needed for chest pain. <PLEASE MAKE APPOINTMENT> 25 tablet 3  . potassium chloride (K-DUR) 10 MEQ tablet Take 10 mEq by mouth as needed.     . pseudoephedrine-acetaminophen (TYLENOL SINUS) 30-500 MG TABS tablet Take 1 tablet by mouth every 4 (four) hours as needed.    . triamcinolone (KENALOG) 0.025 % cream Apply 1 application topically 2 (two) times daily. 30 g 0   No current facility-administered medications for this visit.    Facility-Administered Medications Ordered in Other Visits  Medication Dose Route Frequency Provider Last Rate Last Dose  . fulvestrant (FASLODEX) injection 500 mg  500 mg Intramuscular Q14 Days Truitt Merle, MD   500 mg at 07/04/18 1337    REVIEW OF SYSTEMS:  Constitutional: Denies fevers, chills or abnormal night sweats Eyes: Denies blurriness of vision, double vision or watery eyes Ears, nose, mouth, throat, and face: Denies mucositis or sore throat Respiratory: Denies cough, dyspnea or wheezes Cardiovascular: Denies palpitation, chest discomfort or lower extremity swelling Gastrointestinal:  Denies nausea, heartburn or change in bowel habits Skin: Denies abnormal skin rashes (+) sores on the side of her lips Lymphatics: Denies new lymphadenopathy or easy bruising Neurological:Denies numbness, tingling or new weaknesses Behavioral/Psych: Mood is stable, no new changes  All other systems were reviewed with the patient and are negative.    PHYSICAL EXAMINATION:  BP 137/84 (BP Location: Right Arm, Patient Position: Sitting)   Pulse 74   Temp 97.6 F (36.4 C) (Oral)   Resp 18   Ht 5' 1" (1.549 m)   Wt 130 lb 6.4 oz (59.1 kg)   SpO2 97%   BMI 24.64 kg/m    GENERAL: Patient is a well appearing female in no acute distress HEENT:  Sclerae anicteric.  Oropharynx clear and moist. No ulcerations or evidence of oropharyngeal candidiasis. Neck is supple.  NODES:   There is no cervical , supraclavicular, or  axillary lymphadenopathy palpated.  LUNGS:  Clear to auscultation bilaterally.  No wheezes or rhonchi. HEART:  Regular rate and rhythm. No murmur appreciated.  Chest:  ABDOMEN:  Soft, nontender.  normoactive bowel sounds. No organomegaly palpated.  MSK:  No focal spinal tenderness to palpation. Full range of motion bilaterally in the upper extremities. EXTREMITIES:  No peripheral edema.   SKIN:  Clear with no obvious rashes or skin changes. (+) sores on the side of her lips, worse on left side  NEURO:  Nonfocal. Well oriented.  Appropriate affect. BREAST EXAM: S/p Left breast mastectomy: Left breast is surgically absent, incision in the left frontal chest has healed well. Few tiny nodules on the left chest wall of the incision are barely palpable now. No other palpable masses noted, (+) excessive soft tissue fullness along her incision. Right breast and bilateral axilla notable for no masses or nodules.    ECOG PS:  1   LAB RESULTS: CBC Latest Ref Rng & Units 07/04/2018 05/09/2018 04/11/2018  WBC 3.9 - 10.3 K/uL 3.8(L) 3.5(L) 3.1(L)  Hemoglobin 11.6 - 15.9 g/dL 13.1 13.3 13.1  Hematocrit 34.8 - 46.6 % 40.1 40.1 39.5  Platelets 145 - 400 K/uL 135(L) 133(L) 130(L)      CMP Latest Ref Rng & Units 07/04/2018 05/09/2018 04/11/2018  Glucose 70 - 99 mg/dL 81 113(H) 103  BUN 8 - 23 mg/dL _0 Creatinine 0.44 - 1.00 mg/dL 1.01(H) 0.88 0.87  Sodium 135 - 145 mmol/L 143 143 141  Potassium 3.5 - 5.1 mmol/L 4.4 3.6 3.9  Chloride 98 - 111 mmol/L 105 107 106  CO2 22 - 32 mmol/L _0 Calcium 8.9 - 10.3 mg/dL 9.5 9.5 9.7  Total Protein 6.5 - 8.1 g/dL 6.5 6.5 6.5  Total Bilirubin 0.3 - 1.2 mg/dL 0.6 0.5 0.7  Alkaline Phos 38 - 126 U/L 87 82 81  AST 15 - 41 U/L _1 ALT 0 - 44 U/L _2 Tumor Marker  CA 27.29 Results for Gloria, [name] (MRN [id number]) as of 07/03/2018 11:45  Ref. Range 12/20/2017 10:46 01/17/2018 10:47 03/14/2018 09:38 04/11/2018 11:08 05/09/2018 11:23  CA 27.29 Latest  Ref Range: 0.0 - 38.6 U/mL 25.9 29.4 31.9 55.4 (H) 57.3 (H)     PATHOLOGY REPORT Diagnosis 11/25/2015 Soft tissue mass, simple excision, Left chest wall - INVASIVE LOBULAR CARCINOMA, PRESENT AT NON ORIENTED TISSUE EDGE(S). - SEE COMMENT. Microscopic Comment The carcinoma appears grade 2. A breast prognostic profile will be performed and the results reported separately. (JDP:gt, 11/26/15).  Results: IMMUNOHISTOCHEMICAL AND MORPHOMETRIC ANALYSIS PERFORMED MANUALLY Estrogen Receptor: 95%, POSITIVE, STRONG STAINING INTENSITY Progesterone Receptor: 60%, POSITIVE, STRONG STAINING INTENSITY Results: HER2 - NEGATIVE RATIO OF HER2/CEP17 SIGNALS 1.07 AVERAGE HER2 COPY NUMBER PER CELL 2.90  Diagnosis 07/17/2016 1. Soft tissue mass, simple excision, Chest wall medial left - INVASIVE LOBULAR CARCINOMA, SEE COMMENT. - NODULE OF FIBROSIS AND CALCIFICATION. 2. Soft tissue mass, simple excision, Chest wall lateral left - INVASIVE LOBULAR CARCINOMA, SEE COMMENT. Microscopic Comment 1. -2. Both parts reveal foci of invasive lobular carcinoma. There is only limited tumor in part #1. Tumor does extend to the uninked edges of both parts. The carcinoma appears grade 2. A prognostic panel has been ordered on part #2.  2. PROGNOSTIC INDICATORS Results: IMMUNOHISTOCHEMICAL AND MORPHOMETRIC ANALYSIS PERFORMED MANUALLY Estrogen Receptor: 60%, POSITIVE, STRONG STAINING INTENSITY Progesterone Receptor: 90%, POSITIVE, STRONG STAINING INTENSITY Proliferation Marker Ki67: 10%  Results: HER2 - NEGATIVE RATIO OF HER2/CEP17 SIGNALS 1.20 AVERAGE HER2 COPY NUMBER PER CELL 2.40   RADIOGRAPHIC STUDIES:  03/11/2018 PET Scan IMPRESSION: 1. No evidence of recurrent disease. 2.  Aortic atherosclerosis (ICD10-170.0).  03/07/2018 US Abdomen IMPRESSION: Increased echogenicity of hepatic parenchyma is noted concerning for fatty infiltration. Bilateral simple renal cysts are noted. No other significant abnormality  seen in the abdomen.  Bone Scan Whole Body 09/19/17  IMPRESSION: No definite scintigraphic evidence of osseous metastatic disease. Scattered degenerative type uptake as above.   CT CAP WO Contrast 09/19/17 IMPRESSION: 1. Surgical changes from a left mastectomy without CT findings to suggest recurrent chest wall tumor or axillary lymphadenopathy. 2. No findings to suggest metastatic disease involving the neck, chest, abdomen or pelvis or osseous structures. 3. Stable radiation changes involving the lingula.    PET 04/10/17 IMPRESSION: 1. Stable exam.  No findings of recurrent malignancy. 2. Changes of external beam radiation again noted within the lingula. 3. Small left axillary lymph node exhibits mild increased uptake. Unchanged from previous exam.   Mammogram 03/01/2017 IMPRESSION: No mammographic evidence of malignancy. A result letter of this screening mammogram will be mailed directly to the patient. RECOMMENDATION: Screening mammogram in one year   CT chest, abdomen and pelvis wo contrast  07/27/2016 IMPRESSION: Sigmoid diverticulosis without acute diverticulitis. No bowel obstruction or acute inflammation. Degenerative disc disease and facet arthropathy of the lower lumbar spine with slight grade 1 anterolisthesis of L4 on L5. Stable appearing simple left-sided renal cysts.  PET 07/14/2016 IMPRESSION: 1. No findings of recurrent malignancy. 2. Radiation fibrosis in the  lingula. A small left axillary lymph node is not hypermetabolic and only 6 mm in diameter. This is in the vicinity of the prior axillary cyst. 3. Pelvic floor laxity.  ASSESSMENT/PLAN:   84 y.o. female with: invasive lobular carcinoma of the left breast   1. Cancer of the central portion of left female breast, Stage IIB, T2 N1 invasive lobular carcinoma, grade 2, ER 95%, PR 97%, Ki-67 17%, HER-2/neu no amplification, diagnosed in 07/2010, local chest wall recurrence in 09/2015   -She was on  adjuvant tamoxifen for 4.5 years, stopped on her own. -She unfortunately developed local recurrence 8 months after she stopped tamoxifen. -I previously reviewed her surgical pathology findings, which is consistent with lobular carcinoma recurrence from her previous breast cancer. -The tumor was surgically removed, with positive margins. -Her restaging CT scan and bone scan in 12/2015 was negative for distant metastasis. -She has completed chest wall radiation, however developed 2-3 small nodules during the radiation and anastrozole therapy, suspicious for residual cancer. US showed a 4 mm and 6 mm subcutaneous nodule, partially calcified, highly suspicious for local recurrence. -She subsequently underwent a second chest wall surgery for two soft tissue resection in 06/2016, I previously reviewed her pathology results, both showed invasive lobular carcinoma, margins were positive. I spoke with Dr. Marlou Starks, no more surgery will be offered, because complete surgical resection is unlikely. Patient is also not in favor of more extensive surgery. -We previously discussed that her cancer is unlikely curable at this stage, and our goal of therapy is palliative for disease control, and prolong her life. She voiced good understanding. -I previously reviewed her restaging PET scan from 04/10/2017, which was negative for metastatic disease -She has developed more chest wall tenderness, the tumor nodules along the incision line are slightly bigger and tender, I think she has had some disease progression. I have switched her tamoxifen to fulvestrant injection on 06/07/17. -She has been tolerating Faslodex injections well overall, with some pain at injection sites  -Due to possible anaphylactic reaction, she will not receive CT contrast with scans.  -PET scan on 03/11/2018 showed: No evidence of recurrent disease. I reviewed with her  -She is clinically doing well overall, still has mild to moderate pain at the left mid  axillary chest wall, exam today showed nodules are barely palpable now around her incision line similar to previous exam, otherwise negative. No clinical concern for disease progression. -She was prescribed topical EMLA cream to help numb her chest wall her pain, but she reports not using it.  -Lab adequate to proceed with Fulvestrant injection today and continue monthly.  She will speak our financial staff to see if she can renew her financial assistance -F/u in 8 weeks with a restaging PET scan before   2. HTN, CAD   -She'll continue follow-up with her primary care physician and cardiologist -I previously encouraged her to monitor her BP at home and if remains elevated she should see her PCP. She is on Losartan.   3. Osteoporosis  - her last DEXA scan in 06/2012 showed osteoporosis  - I previously encouraged her to continue calcium and Vit D - I previously encouraged her to repeat DEXA at her PCP office   4. Left Chest Pain  -Secondary to the small tumor nodules on the chest wall. -Since she has been having continued pain, I did offer her Gabapentin; however, she declined this for now. I encouraged her to take Tylenol and Ibuprofen for further management. I also encouraged  her to keep her fluid intake up while taking these medications.  -I previously advised her to regularly stretch her left arm to help with ROM and tightening of her incision location.  -Her pain has increased lately, I advised her to continue nightly tylenol and take another in the morning.  -I previously prescribed her topical EMLA cream to use 1-2 times a day along her left chest wall to help further reduce her pain. -Her latest PET scan was negative for disease, and today's exam showed nodules along breast incision are barely palpable similar to previous exam. -She still complains of left midaxillary chest pain. She states that she's not using the EMLA cream I prescribes before. I advised her to use it.   5. Goal of care  discussion  -We again discussed the incurable nature of her cancer, and the overall poor prognosis, especially if Gloria [name] does not have good response to chemotherapy or progress on chemo -The patient understands the goal of care is palliative. -I previously recommended DNR/DNI, she will think about it   6. Skin lesion on the side of her lips -She's been complaining of sores on the sides of her lips, worse on left side, exam showed a small skin lesion on left end of lip -I advised her to use warm compress and see her PCP for further evaluation   Plan -Proceed with Fulvestrant injection today and continue monthly X2 -Lab and f/u in 8 weeks with PETa few days before  -I will call her daughter [name] after her clinical visit, and updated her condition   I spent 20 minutes counseling the patient face to face. The total time spent in the appointment was 25 minutes.  Dierdre Searles Dweik am acting as scribe for Dr. Truitt Merle.  I have reviewed the above documentation for accuracy and completeness, and I agree with the above.      Truitt Merle  07/04/2018

## 2018-07-04 ENCOUNTER — Inpatient Hospital Stay: Payer: PPO | Attending: Hematology | Admitting: Hematology

## 2018-07-04 ENCOUNTER — Inpatient Hospital Stay: Payer: PPO

## 2018-07-04 ENCOUNTER — Other Ambulatory Visit: Payer: PPO

## 2018-07-04 ENCOUNTER — Encounter: Payer: Self-pay | Admitting: Hematology

## 2018-07-04 ENCOUNTER — Telehealth: Payer: Self-pay

## 2018-07-04 VITALS — BP 137/84 | HR 74 | Temp 97.6°F | Resp 18 | Ht 61.0 in | Wt 130.4 lb

## 2018-07-04 DIAGNOSIS — M5136 Other intervertebral disc degeneration, lumbar region: Secondary | ICD-10-CM | POA: Insufficient documentation

## 2018-07-04 DIAGNOSIS — Z9012 Acquired absence of left breast and nipple: Secondary | ICD-10-CM | POA: Insufficient documentation

## 2018-07-04 DIAGNOSIS — Z803 Family history of malignant neoplasm of breast: Secondary | ICD-10-CM | POA: Insufficient documentation

## 2018-07-04 DIAGNOSIS — L989 Disorder of the skin and subcutaneous tissue, unspecified: Secondary | ICD-10-CM | POA: Insufficient documentation

## 2018-07-04 DIAGNOSIS — C50112 Malignant neoplasm of central portion of left female breast: Secondary | ICD-10-CM

## 2018-07-04 DIAGNOSIS — Z7982 Long term (current) use of aspirin: Secondary | ICD-10-CM | POA: Diagnosis not present

## 2018-07-04 DIAGNOSIS — I2510 Atherosclerotic heart disease of native coronary artery without angina pectoris: Secondary | ICD-10-CM | POA: Diagnosis not present

## 2018-07-04 DIAGNOSIS — Z923 Personal history of irradiation: Secondary | ICD-10-CM | POA: Insufficient documentation

## 2018-07-04 DIAGNOSIS — N281 Cyst of kidney, acquired: Secondary | ICD-10-CM

## 2018-07-04 DIAGNOSIS — Z87891 Personal history of nicotine dependence: Secondary | ICD-10-CM | POA: Diagnosis not present

## 2018-07-04 DIAGNOSIS — Z79810 Long term (current) use of selective estrogen receptor modulators (SERMs): Secondary | ICD-10-CM

## 2018-07-04 DIAGNOSIS — F418 Other specified anxiety disorders: Secondary | ICD-10-CM | POA: Diagnosis not present

## 2018-07-04 DIAGNOSIS — Z79899 Other long term (current) drug therapy: Secondary | ICD-10-CM | POA: Insufficient documentation

## 2018-07-04 DIAGNOSIS — K219 Gastro-esophageal reflux disease without esophagitis: Secondary | ICD-10-CM | POA: Insufficient documentation

## 2018-07-04 DIAGNOSIS — M4316 Spondylolisthesis, lumbar region: Secondary | ICD-10-CM | POA: Insufficient documentation

## 2018-07-04 DIAGNOSIS — I700 Atherosclerosis of aorta: Secondary | ICD-10-CM | POA: Insufficient documentation

## 2018-07-04 DIAGNOSIS — Z79818 Long term (current) use of other agents affecting estrogen receptors and estrogen levels: Secondary | ICD-10-CM | POA: Diagnosis not present

## 2018-07-04 DIAGNOSIS — Z170 Estrogen receptor positive status [ER+]: Principal | ICD-10-CM

## 2018-07-04 DIAGNOSIS — Z8042 Family history of malignant neoplasm of prostate: Secondary | ICD-10-CM | POA: Diagnosis not present

## 2018-07-04 DIAGNOSIS — I10 Essential (primary) hypertension: Secondary | ICD-10-CM | POA: Diagnosis not present

## 2018-07-04 LAB — CBC WITH DIFFERENTIAL/PLATELET
BASOS ABS: 0 10*3/uL (ref 0.0–0.1)
BASOS PCT: 1 %
EOS ABS: 0.4 10*3/uL (ref 0.0–0.5)
EOS PCT: 10 %
HCT: 40.1 % (ref 34.8–46.6)
Hemoglobin: 13.1 g/dL (ref 11.6–15.9)
Lymphocytes Relative: 24 %
Lymphs Abs: 0.9 10*3/uL (ref 0.9–3.3)
MCH: 31.3 pg (ref 25.1–34.0)
MCHC: 32.7 g/dL (ref 31.5–36.0)
MCV: 95.7 fL (ref 79.5–101.0)
Monocytes Absolute: 0.3 10*3/uL (ref 0.1–0.9)
Monocytes Relative: 7 %
NEUTROS PCT: 58 %
Neutro Abs: 2.2 10*3/uL (ref 1.5–6.5)
PLATELETS: 135 10*3/uL — AB (ref 145–400)
RBC: 4.19 MIL/uL (ref 3.70–5.45)
RDW: 13.4 % (ref 11.2–14.5)
WBC: 3.8 10*3/uL — AB (ref 3.9–10.3)

## 2018-07-04 LAB — COMPREHENSIVE METABOLIC PANEL
ALBUMIN: 3.7 g/dL (ref 3.5–5.0)
ALK PHOS: 87 U/L (ref 38–126)
ALT: 11 U/L (ref 0–44)
AST: 18 U/L (ref 15–41)
Anion gap: 10 (ref 5–15)
BILIRUBIN TOTAL: 0.6 mg/dL (ref 0.3–1.2)
BUN: 17 mg/dL (ref 8–23)
CO2: 28 mmol/L (ref 22–32)
CREATININE: 1.01 mg/dL — AB (ref 0.44–1.00)
Calcium: 9.5 mg/dL (ref 8.9–10.3)
Chloride: 105 mmol/L (ref 98–111)
GFR, EST AFRICAN AMERICAN: 56 mL/min — AB (ref >60)
GFR, EST NON AFRICAN AMERICAN: 48 mL/min — AB (ref >60)
Glucose, Bld: 81 mg/dL (ref 70–99)
Potassium: 4.4 mmol/L (ref 3.5–5.1)
Sodium: 143 mmol/L (ref 135–145)
TOTAL PROTEIN: 6.5 g/dL (ref 6.5–8.1)

## 2018-07-04 MED ORDER — FULVESTRANT 250 MG/5ML IM SOLN
INTRAMUSCULAR | Status: AC
  Filled 2018-07-04: qty 5

## 2018-07-04 MED ORDER — FULVESTRANT 250 MG/5ML IM SOLN
500.0000 mg | INTRAMUSCULAR | Status: DC
  Administered 2018-07-04: 500 mg via INTRAMUSCULAR

## 2018-07-04 NOTE — Telephone Encounter (Signed)
Printed avs and calender of upcoming appointment. Per 9/5 los. Gave patient contrast, with instructions, and phone number to radiology

## 2018-07-04 NOTE — Patient Instructions (Signed)

## 2018-07-05 LAB — CANCER ANTIGEN 27.29: CA 27.29: 40.8 U/mL — ABNORMAL HIGH (ref 0.0–38.6)

## 2018-07-08 ENCOUNTER — Telehealth: Payer: Self-pay

## 2018-07-08 NOTE — Telephone Encounter (Signed)
ERROR

## 2018-07-08 NOTE — Telephone Encounter (Signed)
Spoke with patient concerning her CT was changed to a pet scan. Also transferred patient to radiolgy W/L to schedule that appointment. Per 9/5 los

## 2018-08-01 ENCOUNTER — Inpatient Hospital Stay: Payer: PPO | Attending: Hematology

## 2018-08-01 VITALS — BP 122/81 | HR 82 | Temp 98.0°F | Resp 18

## 2018-08-01 DIAGNOSIS — C50112 Malignant neoplasm of central portion of left female breast: Secondary | ICD-10-CM | POA: Diagnosis not present

## 2018-08-01 DIAGNOSIS — Z170 Estrogen receptor positive status [ER+]: Secondary | ICD-10-CM

## 2018-08-01 MED ORDER — FULVESTRANT 250 MG/5ML IM SOLN
500.0000 mg | INTRAMUSCULAR | Status: DC
  Administered 2018-08-01: 500 mg via INTRAMUSCULAR

## 2018-08-02 ENCOUNTER — Other Ambulatory Visit: Payer: Self-pay | Admitting: Nurse Practitioner

## 2018-08-02 ENCOUNTER — Other Ambulatory Visit: Payer: Self-pay | Admitting: Hematology

## 2018-08-02 DIAGNOSIS — C50112 Malignant neoplasm of central portion of left female breast: Secondary | ICD-10-CM

## 2018-08-02 DIAGNOSIS — Z170 Estrogen receptor positive status [ER+]: Principal | ICD-10-CM

## 2018-08-02 MED ORDER — ALPRAZOLAM 0.25 MG PO TABS: ORAL_TABLET | ORAL | 0 refills | Status: DC

## 2018-08-07 DIAGNOSIS — H353132 Nonexudative age-related macular degeneration, bilateral, intermediate dry stage: Secondary | ICD-10-CM | POA: Diagnosis not present

## 2018-08-07 DIAGNOSIS — H348322 Tributary (branch) retinal vein occlusion, left eye, stable: Secondary | ICD-10-CM | POA: Diagnosis not present

## 2018-08-07 DIAGNOSIS — H43812 Vitreous degeneration, left eye: Secondary | ICD-10-CM | POA: Diagnosis not present

## 2018-08-07 DIAGNOSIS — H3562 Retinal hemorrhage, left eye: Secondary | ICD-10-CM | POA: Diagnosis not present

## 2018-09-03 ENCOUNTER — Encounter (HOSPITAL_COMMUNITY)
Admission: RE | Admit: 2018-09-03 | Discharge: 2018-09-03 | Disposition: A | Discharge status: Home / Self Care | Payer: PPO | Source: Ambulatory Visit | Attending: Hematology | Admitting: Hematology

## 2018-09-03 ENCOUNTER — Inpatient Hospital Stay: Payer: PPO | Attending: Hematology

## 2018-09-03 DIAGNOSIS — Z170 Estrogen receptor positive status [ER+]: Secondary | ICD-10-CM | POA: Insufficient documentation

## 2018-09-03 DIAGNOSIS — C50112 Malignant neoplasm of central portion of left female breast: Secondary | ICD-10-CM | POA: Insufficient documentation

## 2018-09-03 DIAGNOSIS — G893 Neoplasm related pain (acute) (chronic): Secondary | ICD-10-CM | POA: Insufficient documentation

## 2018-09-03 DIAGNOSIS — Z87891 Personal history of nicotine dependence: Secondary | ICD-10-CM | POA: Insufficient documentation

## 2018-09-03 DIAGNOSIS — Z5111 Encounter for antineoplastic chemotherapy: Secondary | ICD-10-CM | POA: Diagnosis not present

## 2018-09-03 DIAGNOSIS — Z923 Personal history of irradiation: Secondary | ICD-10-CM | POA: Insufficient documentation

## 2018-09-03 DIAGNOSIS — C50912 Malignant neoplasm of unspecified site of left female breast: Secondary | ICD-10-CM | POA: Diagnosis not present

## 2018-09-03 DIAGNOSIS — Z9012 Acquired absence of left breast and nipple: Secondary | ICD-10-CM | POA: Diagnosis not present

## 2018-09-03 LAB — CBC WITH DIFFERENTIAL/PLATELET
Abs Immature Granulocytes: 0.01 10*3/uL (ref 0.00–0.07)
BASOS ABS: 0.1 10*3/uL (ref 0.0–0.1)
BASOS PCT: 2 %
EOS ABS: 0.3 10*3/uL (ref 0.0–0.5)
EOS PCT: 8 %
HEMATOCRIT: 40.7 % (ref 36.0–46.0)
Hemoglobin: 13.2 g/dL (ref 12.0–15.0)
IMMATURE GRANULOCYTES: 0 %
Lymphocytes Relative: 22 %
Lymphs Abs: 0.8 10*3/uL (ref 0.7–4.0)
MCH: 31.1 pg (ref 26.0–34.0)
MCHC: 32.4 g/dL (ref 30.0–36.0)
MCV: 95.8 fL (ref 80.0–100.0)
Monocytes Absolute: 0.4 10*3/uL (ref 0.1–1.0)
Monocytes Relative: 10 %
NRBC: 0 % (ref 0.0–0.2)
Neutro Abs: 2.1 10*3/uL (ref 1.7–7.7)
Neutrophils Relative %: 58 %
PLATELETS: 155 10*3/uL (ref 150–400)
RBC: 4.25 MIL/uL (ref 3.87–5.11)
RDW: 13 % (ref 11.5–15.5)
WBC: 3.6 10*3/uL — AB (ref 4.0–10.5)

## 2018-09-03 LAB — COMPREHENSIVE METABOLIC PANEL
ALK PHOS: 87 U/L (ref 38–126)
ALT: 14 U/L (ref 0–44)
ANION GAP: 7 (ref 5–15)
AST: 24 U/L (ref 15–41)
Albumin: 3.9 g/dL (ref 3.5–5.0)
BUN: 18 mg/dL (ref 8–23)
CALCIUM: 9.5 mg/dL (ref 8.9–10.3)
CO2: 26 mmol/L (ref 22–32)
CREATININE: 0.83 mg/dL (ref 0.44–1.00)
Chloride: 109 mmol/L (ref 98–111)
Glucose, Bld: 94 mg/dL (ref 70–99)
Potassium: 4.2 mmol/L (ref 3.5–5.1)
SODIUM: 142 mmol/L (ref 135–145)
TOTAL PROTEIN: 6.7 g/dL (ref 6.5–8.1)
Total Bilirubin: 0.7 mg/dL (ref 0.3–1.2)

## 2018-09-03 LAB — GLUCOSE, CAPILLARY: GLUCOSE-CAPILLARY: 86 mg/dL (ref 70–99)

## 2018-09-03 MED ORDER — FLUDEOXYGLUCOSE F - 18 (FDG) INJECTION
6.4700 | Freq: Once | INTRAVENOUS | Status: AC | PRN
  Administered 2018-09-03: 6.47 via INTRAVENOUS

## 2018-09-03 NOTE — Progress Notes (Signed)
[place]  [phone] [phone]  OFFICE PROGRESS NOTE  PATIENT: Gloria [name]   DOB: 05-21-1928  MR#: [id number]  YEM#:[id number]  PN:[id number], [name], MD [name], MD [name]. [name], MD  Date of Service:  09/05/2018    DIAGNOSIS:  An 84 y.o. [place] woman with invasive lobular carcinoma of the left breast diagnosed in 07/2010, chest wall local recurrence in 09/2015 .  Oncology History   Cancer of central portion of left female breast [facility])   Staging form: Breast, AJCC 7th Edition     Pathologic stage from 08/04/2010: Stage IIB (T2, N1a, cM0) - Signed by Truitt Merle, MD on 12/09/2015       Cancer of central portion of left female breast ([place])   08/03/2010 Mammogram     left nipple retraction, and the palpable mass subareolar left breast at the 6:00 position. Ultrasound confirmed the presence of a mass measuring 2.6 x 1.7 x 2.4 cm    08/04/2010 Initial Biopsy    left breast mass biopsy showed an invasive mammary carcinoma with lobular features. ER 95%, PR 97%, Ki-67 17%, HER-2/neu (-)    08/04/2010 Pathologic Stage    Stage IIB: T2 N1a    08/11/2010 Imaging    left breast mass 4.6 x 4.2 x 2.3 cm with enhancement distortion extending to the nipple retraction. There is questionable anterior mediastinal lymph node seen    07/2010 - 09/03/2010 Anti-estrogen oral therapy    neoadjuvant letrozole     09/03/2010 Surgery     left breast mastectomy with sentinel node biopsy on 09/03/2010.    08/2010 - 01/2015 Anti-estrogen oral therapy    Tamoxifen, pt stopped on her own     12/03/2010 - 01/23/2011 Radiation Therapy    left chest wall and axilla radiaiton     10/19/2015 Progression    Left chest wall recurrence, s/p resection on 11/25/2015 with positive margins ([facility])     12/10/2015 - 05/31/2017 Anti-estrogen oral therapy    anastrozole 28m daily, which was switched to Tamoxifen on 06/30/2016 due to persistent disease.    01/03/2016 - 03/03/2016  Radiation Therapy    Radiation to left chest wall recurrence (12/06/2015-02/04/2016 - 44 Gy), pt progressed through the radiation, and had additional 10 fractions of radiation (02/21/16-03/03/2016 - total 70 Gy to the left chest wall)    07/14/2016 PET scan    PET 07/14/2016 IMPRESSION: 1. No findings of recurrent malignancy. 2. Radiation fibrosis in the lingula. A small left axillary lymph node is not hypermetabolic and only 6 mm in diameter. This is in the vicinity of the prior axillary cyst. 3. Pelvic floor laxity.    07/17/2016 Surgery    Chest wall soft tissue resection for local recurrence    07/17/2016 Pathology Results    Left chest wall two soft tissue resection both showed invasive lobular carcinoma, margins were positive. ER 95% positive, PR 60% positive, HER-2 negative.    07/27/2016 Imaging    CT chest, abdomen and pelvis wo contrast  07/27/2016 IMPRESSION: Sigmoid diverticulosis without acute diverticulitis. No bowel obstruction or acute inflammation. Degenerative disc disease and facet arthropathy of the lower lumbar spine with slight grade 1 anterolisthesis of L4 on L5. Stable appearing simple left-sided renal cysts.    03/01/2017 Mammogram    Mammogram 03/01/2017 IMPRESSION: No mammographic evidence of malignancy. A result letter of this screening mammogram will be mailed directly to the patient. RECOMMENDATION: Screening mammogram in one year    04/10/2017  Imaging    PET IMPRESSION: 1. Stable exam.  No findings of recurrent malignancy. 2. Changes of external beam radiation again noted within the lingula. 3. Small left axillary lymph node exhibits mild increased uptake. Unchanged from previous exam.    06/07/2017 -  Anti-estrogen oral therapy    Stopped Tamoxifen and Began Faslodex IM injection on 06/07/17     09/19/2017 Imaging    CT CAP WO Contrast 09/19/17 IMPRESSION: 1. Surgical changes from a left mastectomy without CT findings to suggest recurrent chest wall tumor or  axillary lymphadenopathy. 2. No findings to suggest metastatic disease involving the neck, chest, abdomen or pelvis or osseous structures. 3. Stable radiation changes involving the lingula.     09/19/2017 Imaging    Bone Scan Whole Body  IMPRESSION: No definite scintigraphic evidence of osseous metastatic disease. Scattered degenerative type uptake as above.    03/11/2018 PET scan    IMPRESSION:  1. No evidence of recurrent disease. 2.  Aortic atherosclerosis     09/03/2018 PET scan    09/03/2018 PET Scan IMPRESSION: 1. No findings to suggest locally recurrent disease or metastatic disease in the neck, chest, abdomen or pelvis. 2. Hepatic steatosis. 3. Colonic diverticulosis without evidence of acute diverticulitis at this time. 4. Aortic atherosclerosis. 5. Additional incidental findings, as above.     CURRENT THERAPY:  monthly Faslodex injection started on 06/07/17    INTERVAL HISTORY:  Imani returns for follow-up. She is here today with [name]. She is doing well. She states that her left surgical incision itches and hurts at night. The pain is 5/10 and she is taking one tylenol pill. She feels like the left breast is getting tighter. She says that the itching is around her left surgical incision and upper abdomen, and thinks that this is an allergic reaction to her medications. She uses Benadryl as needed.    PAST MEDICAL HISTORY: Past Medical History:  Diagnosis Date  . Anxiety   . Blood in urine   . CAD (coronary artery disease)    non obstructive by cath  . Cancer [facility])    breast- left  . Depression   . GERD (gastroesophageal reflux disease)   . Hypertension   . Knee fracture   . Personal history of radiation therapy 2017  . TR (tricuspid regurgitation)    mild by Echo 12/2008 EF >55%    PAST SURGICAL HISTORY: Past Surgical History:  Procedure Laterality Date  . ABDOMINAL AORTAGRAM N/A 09/08/2011   Procedure: ABDOMINAL Maxcine Ham;  Surgeon: Troy Sine, MD;   Location: [facility] CATH LAB;  Service: Cardiovascular;  Laterality: N/A;  . ABDOMINAL HYSTERECTOMY    . BREAST EXCISIONAL BIOPSY Left 2017   X3  . BREAST SURGERY  08-30-10   mastectomy  . CARDIAC CATHETERIZATION  08/2011   20% LAD stenosis, 20% diagonal stenosis, 10-20% proximal dominant RCA stenosis  . COLON SURGERY    . LEFT HEART CATHETERIZATION WITH CORONARY ANGIOGRAM N/A 09/08/2011   Procedure: LEFT HEART CATHETERIZATION WITH CORONARY ANGIOGRAM;  Surgeon: Troy Sine, MD;  Location: [facility] CATH LAB;  Service: Cardiovascular;  Laterality: N/A;  . MASTECTOMY    . MINOR BREAST BIOPSY Left 11/25/2015   Procedure: MINOR EXCISION MASS LEFT CHEST WALL;  Surgeon: Autumn Messing III, MD;  Location: [place];  Service: General;  Laterality: Left;  . MINOR BREAST BIOPSY Left 07/17/2016   Procedure: EXCISION OF 2 LEFT CHEST WALL MASSES;  Surgeon: Autumn Messing III, MD;  Location: [place]  [facility];  Service: General;  Laterality: Left;  . TONSILLECTOMY      FAMILY HISTORY: Family History  Problem Relation Age of Onset  . Cancer Father        prostate  . Cancer Sister        breast  . Heart disease Sister   . Breast cancer Sister   . Cancer Brother        pancreatic  . Cancer Maternal Grandmother        colon  . Dementia Mother   . Sudden death Brother   . Heart attack Brother   . Heart attack Brother   . Arthritis Sister   . Breast cancer Maternal Aunt     SOCIAL HISTORY: Social History   Tobacco Use  . Smoking status: Former Smoker    Last attempt to quit: 11/15/1991    Years since quitting: 26.8  . Smokeless tobacco: Never Used  Substance Use Topics  . Alcohol use: Yes    Alcohol/week: 2.0 standard drinks    Types: 2 Glasses of wine per week    Comment: 1 glass per week  . Drug use: No    ALLERGIES: Allergies  Allergen Reactions  . Contrast Media [Iodinated Diagnostic Agents] Anaphylaxis    09/19/17-Pt premedicated. Then pt stated she was DNR. Spoke with Dr.  Burr Medico who did not realize allergy was recorded as anaphylactic. [name] verbally gave ok to change CT exam to without IV contrast. Pt and daughter do not recall occurrence of anaphylactic reaction to contrast media. Geanie Kenning, 3:27pm  . Penicillins Anaphylaxis and Swelling    Has patient had a PCN reaction causing immediate rash, facial/tongue/throat swelling, SOB or lightheadedness with hypotension: Yes Has patient had a PCN reaction causing severe rash involving mucus membranes or skin necrosis: No Has patient had a PCN reaction that required hospitalization Yes Has patient had a PCN reaction occurring within the last 10 years: No If all of the above answers are "NO", then may proceed with Cephalosporin use.   . Adhesive [Tape] Other (See Comments)    blisters  . Cephalexin Swelling  . Ciprofloxacin Swelling  . Demerol Nausea And Vomiting  . Sulfamethoxazole-Trimethoprim Itching and Swelling    REACTION: swelling/hives  . Quinolones Itching and Rash    REACTION: itching, rash Pt. Reports no problems with levaquin     MEDICATIONS:  Current Outpatient Medications  Medication Sig Dispense Refill  . acetaminophen (TYLENOL) 500 MG tablet Take 1,000 mg by mouth daily as needed for moderate pain. Reported on 03/10/2016    . ALPRAZolam (XANAX) 0.25 MG tablet Take 1/2 tablet by mouth in the morning and at bedtime as needed 45 tablet 0  . aspirin 81 MG chewable tablet Chew 81 mg by mouth once a week. Reported on 03/10/2016    . CALCIUM-MAGNESIUM-VITAMIN D PO Take 1 tablet by mouth 2 (two) times daily.    . cetirizine (ZYRTEC) 10 MG tablet Take 10 mg by mouth as needed for allergies.     . diphenhydrAMINE (BENADRYL) 50 MG tablet Take 1 tablet (50 mg total) by mouth as directed. Take Benadryl 50 mg po 1 hour before CT scan. 1 tablet 0  . doxepin (SINEQUAN) 10 MG capsule Take 10 mg by mouth at bedtime.     . fluconazole (DIFLUCAN) 100 MG tablet Take 1 tablet (100 mg total) by mouth daily. 1  tablet 0  . fluticasone (FLONASE) 50 MCG/ACT nasal spray Place 1 spray into both nostrils daily  as needed for allergies.     Marland Kitchen guaiFENesin (MUCINEX) 600 MG 12 hr tablet Take 600 mg by mouth daily as needed for cough or to loosen phlegm. Reported on 03/28/2016    . lidocaine-prilocaine (EMLA) cream Apply 1 application topically 2 (two) times daily as needed. 30 g 1  . losartan (COZAAR) 25 MG tablet Take 25 mg by mouth daily.   0  . nitroGLYCERIN (NITROSTAT) 0.4 MG SL tablet Place 1 tablet (0.4 mg total) under the tongue every 5 (five) minutes as needed for chest pain. <PLEASE MAKE APPOINTMENT> 25 tablet 3  . potassium chloride (K-DUR) 10 MEQ tablet Take 10 mEq by mouth as needed.     . pseudoephedrine-acetaminophen (TYLENOL SINUS) 30-500 MG TABS tablet Take 1 tablet by mouth every 4 (four) hours as needed.    . triamcinolone (KENALOG) 0.025 % cream Apply 1 application topically 2 (two) times daily. 30 g 0   No current facility-administered medications for this visit.     REVIEW OF SYSTEMS:  Constitutional: Denies fevers, chills or abnormal night sweats Eyes: Denies blurriness of vision, double vision or watery eyes Ears, nose, mouth, throat, and face: Denies mucositis or sore throat Respiratory: Denies cough, dyspnea or wheezes Cardiovascular: Denies palpitation, chest discomfort or lower extremity swelling Gastrointestinal:  Denies nausea, heartburn or change in bowel habits Skin: Denies abnormal skin rashes  Lymphatics: Denies new lymphadenopathy or easy bruising Neurological:Denies numbness, tingling or new weaknesses Breast: (+) left incision itching and pain Behavioral/Psych: Mood is stable, no new changes  All other systems were reviewed with the patient and are negative.   PHYSICAL EXAMINATION:  BP 127/80 (BP Location: Left Arm, Patient Position: Sitting)   Pulse 86   Temp 98.4 F (36.9 C) (Oral)   Resp 17   Ht 5' 1"  (1.549 m)   Wt 127 lb 14.4 oz (58 kg)   SpO2 95%   BMI  24.17 kg/m    GENERAL: Patient is a well appearing female in no acute distress HEENT:  Sclerae anicteric.  Oropharynx clear and moist. No ulcerations or evidence of oropharyngeal candidiasis. Neck is supple.  NODES:   There is no cervical , supraclavicular, or axillary lymphadenopathy palpated.  LUNGS:  Clear to auscultation bilaterally.  No wheezes or rhonchi. HEART:  Regular rate and rhythm. No murmur appreciated.  Chest:  ABDOMEN:  Soft, nontender.  normoactive bowel sounds. No organomegaly palpated.  MSK:  No focal spinal tenderness to palpation. Full range of motion bilaterally in the upper extremities. EXTREMITIES:  No peripheral edema.   SKIN:  Clear with no obvious rashes or skin changes. (+) sores on the side of her lips, worse on left side  NEURO:  Nonfocal. Well oriented.  Appropriate affect. BREAST EXAM: S/p Left breast mastectomy: Left breast is surgically absent, incision in the left frontal chest has healed well. There is mild tenderness at left surgical incision. No other palpable masses noted. No rash seen   ECOG PS:  1   LAB RESULTS: CBC Latest Ref Rng & Units 09/03/2018 07/04/2018 05/09/2018  WBC 4.0 - 10.5 K/uL 3.6(L) 3.8(L) 3.5(L)  Hemoglobin 12.0 - 15.0 g/dL 13.2 13.1 13.3  Hematocrit 36.0 - 46.0 % 40.7 40.1 40.1  Platelets 150 - 400 K/uL 155 135(L) 133(L)      CMP Latest Ref Rng & Units 09/03/2018 07/04/2018 05/09/2018  Glucose 70 - 99 mg/dL 94 81 113(H)  BUN 8 - 23 mg/dL 18 17 21   Creatinine 0.44 - 1.00 mg/dL 0.83 1.01(H)  0.88  Sodium 135 - 145 mmol/L 142 143 143  Potassium 3.5 - 5.1 mmol/L 4.2 4.4 3.6  Chloride 98 - 111 mmol/L 109 105 107  CO2 22 - 32 mmol/L 26 28 28   Calcium 8.9 - 10.3 mg/dL 9.5 9.5 9.5  Total Protein 6.5 - 8.1 g/dL 6.7 6.5 6.5  Total Bilirubin 0.3 - 1.2 mg/dL 0.7 0.6 0.5  Alkaline Phos 38 - 126 U/L 87 87 82  AST 15 - 41 U/L 24 18 21   ALT 0 - 44 U/L 14 11 13    Tumor Marker  CA 27.29 11/22/17: 32.3 12/20/17: 25.9 01/17/18: 29.4 03/14/18:  31.9 04/11/18: 55.4 05/09/18: 57.3 07/04/18: 40.8   PATHOLOGY REPORT  Diagnosis 11/25/2015 Soft tissue mass, simple excision, Left chest wall - INVASIVE LOBULAR CARCINOMA, PRESENT AT NON ORIENTED TISSUE EDGE(S). - SEE COMMENT. Microscopic Comment The carcinoma appears grade 2. A breast prognostic profile will be performed and the results reported separately. (JDP:gt, 11/26/15).  Results: IMMUNOHISTOCHEMICAL AND MORPHOMETRIC ANALYSIS PERFORMED MANUALLY Estrogen Receptor: 95%, POSITIVE, STRONG STAINING INTENSITY Progesterone Receptor: 60%, POSITIVE, STRONG STAINING INTENSITY Results: HER2 - NEGATIVE RATIO OF HER2/CEP17 SIGNALS 1.07 AVERAGE HER2 COPY NUMBER PER CELL 2.90  Diagnosis 07/17/2016 1. Soft tissue mass, simple excision, Chest wall medial left - INVASIVE LOBULAR CARCINOMA, SEE COMMENT. - NODULE OF FIBROSIS AND CALCIFICATION. 2. Soft tissue mass, simple excision, Chest wall lateral left - INVASIVE LOBULAR CARCINOMA, SEE COMMENT. Microscopic Comment 1. -2. Both parts reveal foci of invasive lobular carcinoma. There is only limited tumor in part #1. Tumor does extend to the uninked edges of both parts. The carcinoma appears grade 2. A prognostic panel has been ordered on part #2.  2. PROGNOSTIC INDICATORS Results: IMMUNOHISTOCHEMICAL AND MORPHOMETRIC ANALYSIS PERFORMED MANUALLY Estrogen Receptor: 60%, POSITIVE, STRONG STAINING INTENSITY Progesterone Receptor: 90%, POSITIVE, STRONG STAINING INTENSITY Proliferation Marker Ki67: 10%  Results: HER2 - NEGATIVE RATIO OF HER2/CEP17 SIGNALS 1.20 AVERAGE HER2 COPY NUMBER PER CELL 2.40   RADIOGRAPHIC STUDIES:  09/03/2018 PET Scan IMPRESSION: 1. No findings to suggest locally recurrent disease or metastatic disease in the neck, chest, abdomen or pelvis. 2. Hepatic steatosis. 3. Colonic diverticulosis without evidence of acute diverticulitis at this time. 4. Aortic atherosclerosis. 5. Additional incidental findings, as  above.  03/11/2018 PET Scan IMPRESSION: 1. No evidence of recurrent disease. 2.  Aortic atherosclerosis (ICD10-170.0).  03/07/2018 US Abdomen IMPRESSION: Increased echogenicity of hepatic parenchyma is noted concerning for fatty infiltration. Bilateral simple renal cysts are noted. No other significant abnormality seen in the abdomen.  Bone Scan Whole Body 09/19/17  IMPRESSION: No definite scintigraphic evidence of osseous metastatic disease. Scattered degenerative type uptake as above.   CT CAP WO Contrast 09/19/17 IMPRESSION: 1. Surgical changes from a left mastectomy without CT findings to suggest recurrent chest wall tumor or axillary lymphadenopathy. 2. No findings to suggest metastatic disease involving the neck, chest, abdomen or pelvis or osseous structures. 3. Stable radiation changes involving the lingula.    PET 04/10/17 IMPRESSION: 1. Stable exam.  No findings of recurrent malignancy. 2. Changes of external beam radiation again noted within the lingula. 3. Small left axillary lymph node exhibits mild increased uptake. Unchanged from previous exam.   Mammogram 03/01/2017 IMPRESSION: No mammographic evidence of malignancy. A result letter of this screening mammogram will be mailed directly to the patient. RECOMMENDATION: Screening mammogram in one year   CT chest, abdomen and pelvis wo contrast  07/27/2016 IMPRESSION: Sigmoid diverticulosis without acute diverticulitis. No bowel obstruction or acute inflammation. Degenerative disc disease  and facet arthropathy of the lower lumbar spine with slight grade 1 anterolisthesis of L4 on L5. Stable appearing simple left-sided renal cysts.  PET 07/14/2016 IMPRESSION: 1. No findings of recurrent malignancy. 2. Radiation fibrosis in the lingula. A small left axillary lymph node is not hypermetabolic and only 6 mm in diameter. This is in the vicinity of the prior axillary cyst. 3. Pelvic floor  laxity.  ASSESSMENT/PLAN:   84 y.o. female with: invasive lobular carcinoma of the left breast   1. Cancer of the central portion of left female breast, Stage IIB, T2 N1 invasive lobular carcinoma, grade 2, ER 95%, PR 97%, Ki-67 17%, HER-2/neu no amplification, diagnosed in 07/2010, local chest wall recurrence in 09/2015   -She was on adjuvant tamoxifen for 4.5 years, stopped on her own. -She unfortunately developed local recurrence 8 months after she stopped tamoxifen. -I previously reviewed her surgical pathology findings, which is consistent with lobular carcinoma recurrence from her previous breast cancer. -The tumor was surgically removed, with positive margins. -Her restaging CT scan and bone scan in 12/2015 was negative for distant metastasis. -She has completed chest wall radiation, however developed 2-3 small nodules during the radiation and anastrozole therapy, suspicious for residual cancer. US showed a 4 mm and 6 mm subcutaneous nodule, partially calcified, highly suspicious for local recurrence. -She subsequently underwent a second chest wall surgery for two soft tissue resection in 06/2016, I previously reviewed her pathology results, both showed invasive lobular carcinoma, margins were positive. I spoke with Dr. Marlou Starks, no more surgery will be offered, because complete surgical resection is unlikely. Patient is also not in favor of more extensive surgery. -We previously discussed that her cancer is unlikely curable at this stage, and our goal of therapy is palliative for disease control, and prolong her life. She voiced good understanding. -I previously reviewed her restaging PET scan from 04/10/2017, which was negative for metastatic disease -I have switched her tamoxifen to fulvestrant injection on 06/07/17 due to clinical suspicion for disease progress  -She has been tolerating Faslodex injections well overall, with some pain at injection sites  -Due to possible anaphylactic reaction,  she will not receive CT contrast with scans.  -PET scan on 03/11/2018 showed: No evidence of recurrent disease. I reviewed with her  -She is clinically doing well overall, left chest wall pain has much improved, exam today showed mild tenderness of left breast, otherwise negative. No clinical concern for disease progression. -Her tumor marker CA27.29 has decreased to normal lately, indicating good clinical response  -restaging PET from 09/03/2018 reviewed with pt and her daughter, showed no hypermetabolic lesion -Lab adequate to proceed with Fulvestrant injection today and continue monthly.  -f/u in 3 months   2. HTN, CAD   -She'll continue follow-up with her primary care physician and cardiologist -I previously encouraged her to monitor her BP at home and if remains elevated she should see her PCP. She is on Losartan.   3. Osteoporosis  - her last DEXA scan in 06/2012 showed osteoporosis  - I previously encouraged her to continue calcium and Vit D - I previously encouraged her to repeat DEXA at her PCP office   4. Left Chest Pain  -Secondary to the small tumor nodules on the chest wall. -Since she has been having continued pain, I did offer her Gabapentin; however, she declined this for now. I encouraged her to take Tylenol and Ibuprofen for further management. I also encouraged her to keep her fluid intake up while taking  these medications.  -I previously advised her to regularly stretch her left arm to help with ROM and tightening of her incision location.  -Her pain has increased lately, I advised her to continue nightly tylenol and take another in the morning.  -I previously prescribed her topical EMLA cream to use 1-2 times a day along her left chest wall to help further reduce her pain. -Her latest PET scan was negative for disease. -She still complains of left midaxillary chest pain. She states that she's not using the EMLA cream I prescribed before. I advised her to use it.   5. Goal  of care discussion  -We again discussed the incurable nature of her cancer, and the overall poor prognosis, especially if he/she does not have good response to chemotherapy or progress on chemo -The patient understands the goal of care is palliative. -I previously recommended DNR/DNI, she will think about it   Plan -PET scan reviewed, NED -continue Fulvestrant injection today and monthly Lab and f/u in 3 months    I spent 20 minutes counseling the patient face to face. The total time spent in the appointment was 25 minutes.  Dierdre Searles Dweik am acting as scribe for Dr. Truitt Merle.  I have reviewed the above documentation for accuracy and completeness, and I agree with the above.   Truitt Merle  09/05/2018

## 2018-09-04 LAB — CANCER ANTIGEN 27.29: CA 27.29: 35.9 U/mL (ref 0.0–38.6)

## 2018-09-05 ENCOUNTER — Telehealth: Payer: Self-pay

## 2018-09-05 ENCOUNTER — Inpatient Hospital Stay (HOSPITAL_BASED_OUTPATIENT_CLINIC_OR_DEPARTMENT_OTHER): Payer: PPO | Admitting: Hematology

## 2018-09-05 ENCOUNTER — Inpatient Hospital Stay: Payer: PPO

## 2018-09-05 ENCOUNTER — Encounter: Payer: Self-pay | Admitting: Hematology

## 2018-09-05 VITALS — BP 127/80 | HR 86 | Temp 98.4°F | Resp 17 | Ht 61.0 in | Wt 127.9 lb

## 2018-09-05 DIAGNOSIS — Z170 Estrogen receptor positive status [ER+]: Secondary | ICD-10-CM

## 2018-09-05 DIAGNOSIS — C50112 Malignant neoplasm of central portion of left female breast: Secondary | ICD-10-CM

## 2018-09-05 DIAGNOSIS — G893 Neoplasm related pain (acute) (chronic): Secondary | ICD-10-CM | POA: Diagnosis not present

## 2018-09-05 DIAGNOSIS — Z5111 Encounter for antineoplastic chemotherapy: Secondary | ICD-10-CM | POA: Diagnosis not present

## 2018-09-05 DIAGNOSIS — Z923 Personal history of irradiation: Secondary | ICD-10-CM

## 2018-09-05 DIAGNOSIS — Z9012 Acquired absence of left breast and nipple: Secondary | ICD-10-CM | POA: Diagnosis not present

## 2018-09-05 DIAGNOSIS — Z87891 Personal history of nicotine dependence: Secondary | ICD-10-CM

## 2018-09-05 MED ORDER — FULVESTRANT 250 MG/5ML IM SOLN
INTRAMUSCULAR | Status: AC
  Filled 2018-09-05: qty 5

## 2018-09-05 MED ORDER — FULVESTRANT 250 MG/5ML IM SOLN
500.0000 mg | INTRAMUSCULAR | Status: DC
  Administered 2018-09-05: 500 mg via INTRAMUSCULAR

## 2018-09-05 NOTE — Patient Instructions (Signed)

## 2018-09-05 NOTE — Telephone Encounter (Signed)
Per 11/7 los printed avs and calender of upcoming appointment.

## 2018-09-30 DIAGNOSIS — M1711 Unilateral primary osteoarthritis, right knee: Secondary | ICD-10-CM | POA: Diagnosis not present

## 2018-09-30 DIAGNOSIS — M170 Bilateral primary osteoarthritis of knee: Secondary | ICD-10-CM | POA: Diagnosis not present

## 2018-09-30 DIAGNOSIS — M1712 Unilateral primary osteoarthritis, left knee: Secondary | ICD-10-CM | POA: Diagnosis not present

## 2018-10-01 ENCOUNTER — Other Ambulatory Visit: Payer: Self-pay | Admitting: Nurse Practitioner

## 2018-10-01 DIAGNOSIS — J0100 Acute maxillary sinusitis, unspecified: Secondary | ICD-10-CM | POA: Diagnosis not present

## 2018-10-01 DIAGNOSIS — C50112 Malignant neoplasm of central portion of left female breast: Secondary | ICD-10-CM

## 2018-10-01 DIAGNOSIS — Z170 Estrogen receptor positive status [ER+]: Principal | ICD-10-CM

## 2018-10-02 ENCOUNTER — Other Ambulatory Visit: Payer: Self-pay | Admitting: Nurse Practitioner

## 2018-10-02 DIAGNOSIS — C50112 Malignant neoplasm of central portion of left female breast: Secondary | ICD-10-CM

## 2018-10-02 DIAGNOSIS — Z170 Estrogen receptor positive status [ER+]: Principal | ICD-10-CM

## 2018-10-02 MED ORDER — ALPRAZOLAM 0.25 MG PO TABS: ORAL_TABLET | ORAL | 0 refills | Status: DC

## 2018-10-03 ENCOUNTER — Inpatient Hospital Stay: Payer: PPO

## 2018-10-03 ENCOUNTER — Inpatient Hospital Stay: Payer: PPO | Attending: Hematology

## 2018-10-03 DIAGNOSIS — Z170 Estrogen receptor positive status [ER+]: Principal | ICD-10-CM

## 2018-10-03 DIAGNOSIS — Z79818 Long term (current) use of other agents affecting estrogen receptors and estrogen levels: Secondary | ICD-10-CM | POA: Insufficient documentation

## 2018-10-03 DIAGNOSIS — C50112 Malignant neoplasm of central portion of left female breast: Secondary | ICD-10-CM

## 2018-10-03 DIAGNOSIS — C50012 Malignant neoplasm of nipple and areola, left female breast: Secondary | ICD-10-CM | POA: Insufficient documentation

## 2018-10-03 LAB — COMPREHENSIVE METABOLIC PANEL
ALK PHOS: 85 U/L (ref 38–126)
ALT: 18 U/L (ref 0–44)
AST: 23 U/L (ref 15–41)
Albumin: 3.7 g/dL (ref 3.5–5.0)
Anion gap: 7 (ref 5–15)
BILIRUBIN TOTAL: 0.7 mg/dL (ref 0.3–1.2)
BUN: 25 mg/dL — ABNORMAL HIGH (ref 8–23)
CALCIUM: 9.1 mg/dL (ref 8.9–10.3)
CO2: 26 mmol/L (ref 22–32)
CREATININE: 0.98 mg/dL (ref 0.44–1.00)
Chloride: 108 mmol/L (ref 98–111)
GFR, EST AFRICAN AMERICAN: 60 mL/min — AB (ref >60)
GFR, EST NON AFRICAN AMERICAN: 52 mL/min — AB (ref >60)
Glucose, Bld: 111 mg/dL — ABNORMAL HIGH (ref 70–99)
Potassium: 3.8 mmol/L (ref 3.5–5.1)
Sodium: 141 mmol/L (ref 135–145)
TOTAL PROTEIN: 6.3 g/dL — AB (ref 6.5–8.1)

## 2018-10-03 LAB — CBC WITH DIFFERENTIAL/PLATELET
Abs Immature Granulocytes: 0.04 10*3/uL (ref 0.00–0.07)
Basophils Absolute: 0 10*3/uL (ref 0.0–0.1)
Basophils Relative: 0 %
EOS PCT: 1 %
Eosinophils Absolute: 0 10*3/uL (ref 0.0–0.5)
HCT: 41.1 % (ref 36.0–46.0)
HEMOGLOBIN: 13.4 g/dL (ref 12.0–15.0)
Immature Granulocytes: 1 %
LYMPHS PCT: 13 %
Lymphs Abs: 0.9 10*3/uL (ref 0.7–4.0)
MCH: 30.9 pg (ref 26.0–34.0)
MCHC: 32.6 g/dL (ref 30.0–36.0)
MCV: 94.9 fL (ref 80.0–100.0)
MONO ABS: 0.5 10*3/uL (ref 0.1–1.0)
MONOS PCT: 7 %
Neutro Abs: 5.5 10*3/uL (ref 1.7–7.7)
Neutrophils Relative %: 78 %
Platelets: 160 10*3/uL (ref 150–400)
RBC: 4.33 MIL/uL (ref 3.87–5.11)
RDW: 13.1 % (ref 11.5–15.5)
WBC: 7.1 10*3/uL (ref 4.0–10.5)
nRBC: 0 % (ref 0.0–0.2)

## 2018-10-03 MED ORDER — FULVESTRANT 250 MG/5ML IM SOLN
INTRAMUSCULAR | Status: AC
  Filled 2018-10-03: qty 5

## 2018-10-03 MED ORDER — FULVESTRANT 250 MG/5ML IM SOLN
500.0000 mg | Freq: Once | INTRAMUSCULAR | Status: AC
  Administered 2018-10-03: 500 mg via INTRAMUSCULAR

## 2018-10-04 LAB — CANCER ANTIGEN 27.29: CA 27.29: 28 U/mL (ref 0.0–38.6)

## 2018-10-31 ENCOUNTER — Inpatient Hospital Stay: Payer: PPO | Attending: Hematology

## 2018-10-31 ENCOUNTER — Inpatient Hospital Stay: Payer: PPO

## 2018-10-31 DIAGNOSIS — C50112 Malignant neoplasm of central portion of left female breast: Secondary | ICD-10-CM | POA: Diagnosis not present

## 2018-10-31 DIAGNOSIS — Z79818 Long term (current) use of other agents affecting estrogen receptors and estrogen levels: Secondary | ICD-10-CM | POA: Diagnosis not present

## 2018-10-31 DIAGNOSIS — Z170 Estrogen receptor positive status [ER+]: Principal | ICD-10-CM

## 2018-10-31 LAB — COMPREHENSIVE METABOLIC PANEL
ALT: 17 U/L (ref 0–44)
AST: 22 U/L (ref 15–41)
Albumin: 3.7 g/dL (ref 3.5–5.0)
Alkaline Phosphatase: 91 U/L (ref 38–126)
Anion gap: 7 (ref 5–15)
BUN: 12 mg/dL (ref 8–23)
CO2: 28 mmol/L (ref 22–32)
Calcium: 9.3 mg/dL (ref 8.9–10.3)
Chloride: 108 mmol/L (ref 98–111)
Creatinine, Ser: 0.89 mg/dL (ref 0.44–1.00)
GFR calc Af Amer: >60 mL/min (ref >60)
GFR calc non Af Amer: 58 mL/min — ABNORMAL LOW (ref >60)
Glucose, Bld: 94 mg/dL (ref 70–99)
Potassium: 3.8 mmol/L (ref 3.5–5.1)
Sodium: 143 mmol/L (ref 135–145)
Total Bilirubin: 0.6 mg/dL (ref 0.3–1.2)
Total Protein: 6.6 g/dL (ref 6.5–8.1)

## 2018-10-31 LAB — CBC WITH DIFFERENTIAL/PLATELET
Abs Immature Granulocytes: 0.01 10*3/uL (ref 0.00–0.07)
BASOS PCT: 1 %
Basophils Absolute: 0 10*3/uL (ref 0.0–0.1)
Eosinophils Absolute: 0.2 10*3/uL (ref 0.0–0.5)
Eosinophils Relative: 6 %
HCT: 42.4 % (ref 36.0–46.0)
Hemoglobin: 13.5 g/dL (ref 12.0–15.0)
Immature Granulocytes: 0 %
Lymphocytes Relative: 21 %
Lymphs Abs: 0.7 10*3/uL (ref 0.7–4.0)
MCH: 30.8 pg (ref 26.0–34.0)
MCHC: 31.8 g/dL (ref 30.0–36.0)
MCV: 96.8 fL (ref 80.0–100.0)
Monocytes Absolute: 0.4 10*3/uL (ref 0.1–1.0)
Monocytes Relative: 11 %
Neutro Abs: 2.1 10*3/uL (ref 1.7–7.7)
Neutrophils Relative %: 61 %
Platelets: 149 10*3/uL — ABNORMAL LOW (ref 150–400)
RBC: 4.38 MIL/uL (ref 3.87–5.11)
RDW: 13 % (ref 11.5–15.5)
WBC: 3.4 10*3/uL — AB (ref 4.0–10.5)
nRBC: 0 % (ref 0.0–0.2)

## 2018-10-31 MED ORDER — FULVESTRANT 250 MG/5ML IM SOLN
500.0000 mg | INTRAMUSCULAR | Status: DC
  Administered 2018-10-31: 500 mg via INTRAMUSCULAR

## 2018-10-31 MED ORDER — FULVESTRANT 250 MG/5ML IM SOLN
INTRAMUSCULAR | Status: AC
  Filled 2018-10-31: qty 5

## 2018-10-31 NOTE — Patient Instructions (Signed)

## 2018-11-01 LAB — CANCER ANTIGEN 27.29: CA 27.29: 39 U/mL — ABNORMAL HIGH (ref 0.0–38.6)

## 2018-11-02 ENCOUNTER — Other Ambulatory Visit: Payer: Self-pay | Admitting: Nurse Practitioner

## 2018-11-02 DIAGNOSIS — C50112 Malignant neoplasm of central portion of left female breast: Secondary | ICD-10-CM

## 2018-11-02 DIAGNOSIS — Z170 Estrogen receptor positive status [ER+]: Principal | ICD-10-CM

## 2018-11-04 ENCOUNTER — Other Ambulatory Visit: Payer: Self-pay | Admitting: Hematology

## 2018-11-04 DIAGNOSIS — C50112 Malignant neoplasm of central portion of left female breast: Secondary | ICD-10-CM

## 2018-11-04 DIAGNOSIS — Z170 Estrogen receptor positive status [ER+]: Principal | ICD-10-CM

## 2018-11-04 MED ORDER — ALPRAZOLAM 0.25 MG PO TABS: ORAL_TABLET | ORAL | 1 refills | Status: DC

## 2018-11-04 NOTE — Telephone Encounter (Signed)
Need authorization to refill medication- Sent to [facility]'s basket

## 2018-11-06 ENCOUNTER — Telehealth: Payer: Self-pay

## 2018-11-06 ENCOUNTER — Encounter: Payer: Self-pay | Admitting: Hematology

## 2018-11-06 ENCOUNTER — Other Ambulatory Visit: Payer: Self-pay | Admitting: Hematology

## 2018-11-06 DIAGNOSIS — Z170 Estrogen receptor positive status [ER+]: Principal | ICD-10-CM

## 2018-11-06 DIAGNOSIS — C50112 Malignant neoplasm of central portion of left female breast: Secondary | ICD-10-CM

## 2018-11-06 MED ORDER — ALPRAZOLAM 0.25 MG PO TABS: ORAL_TABLET | ORAL | 1 refills | Status: DC

## 2018-11-06 NOTE — Telephone Encounter (Signed)
Patient calls stating her refill for Xanax was incorrect, needs to be: take one half tablet in am and 1 tablet at bedtime to [facility].

## 2018-11-06 NOTE — Progress Notes (Signed)
Re enrolled pt w/ the[employer] Foundationand she was approvedfor $16,000 for 12 months from 11/06/18. The standard award is $2,500 and the additional $13,500 is accessible on a first-come, first- served basis as long as funding remains available in the [facility] [place].

## 2018-11-12 DIAGNOSIS — N3020 Other chronic cystitis without hematuria: Secondary | ICD-10-CM | POA: Diagnosis not present

## 2018-11-12 DIAGNOSIS — M545 Low back pain: Secondary | ICD-10-CM | POA: Diagnosis not present

## 2018-11-17 IMAGING — CR DG CHEST 2V
2 series · 2 of 2 positions shown · non-contrast
Comparison: 09/02/2015

CLINICAL DATA: Cough congestion for 1 week

EXAM:
CHEST  2 VIEW

[w chest pa]
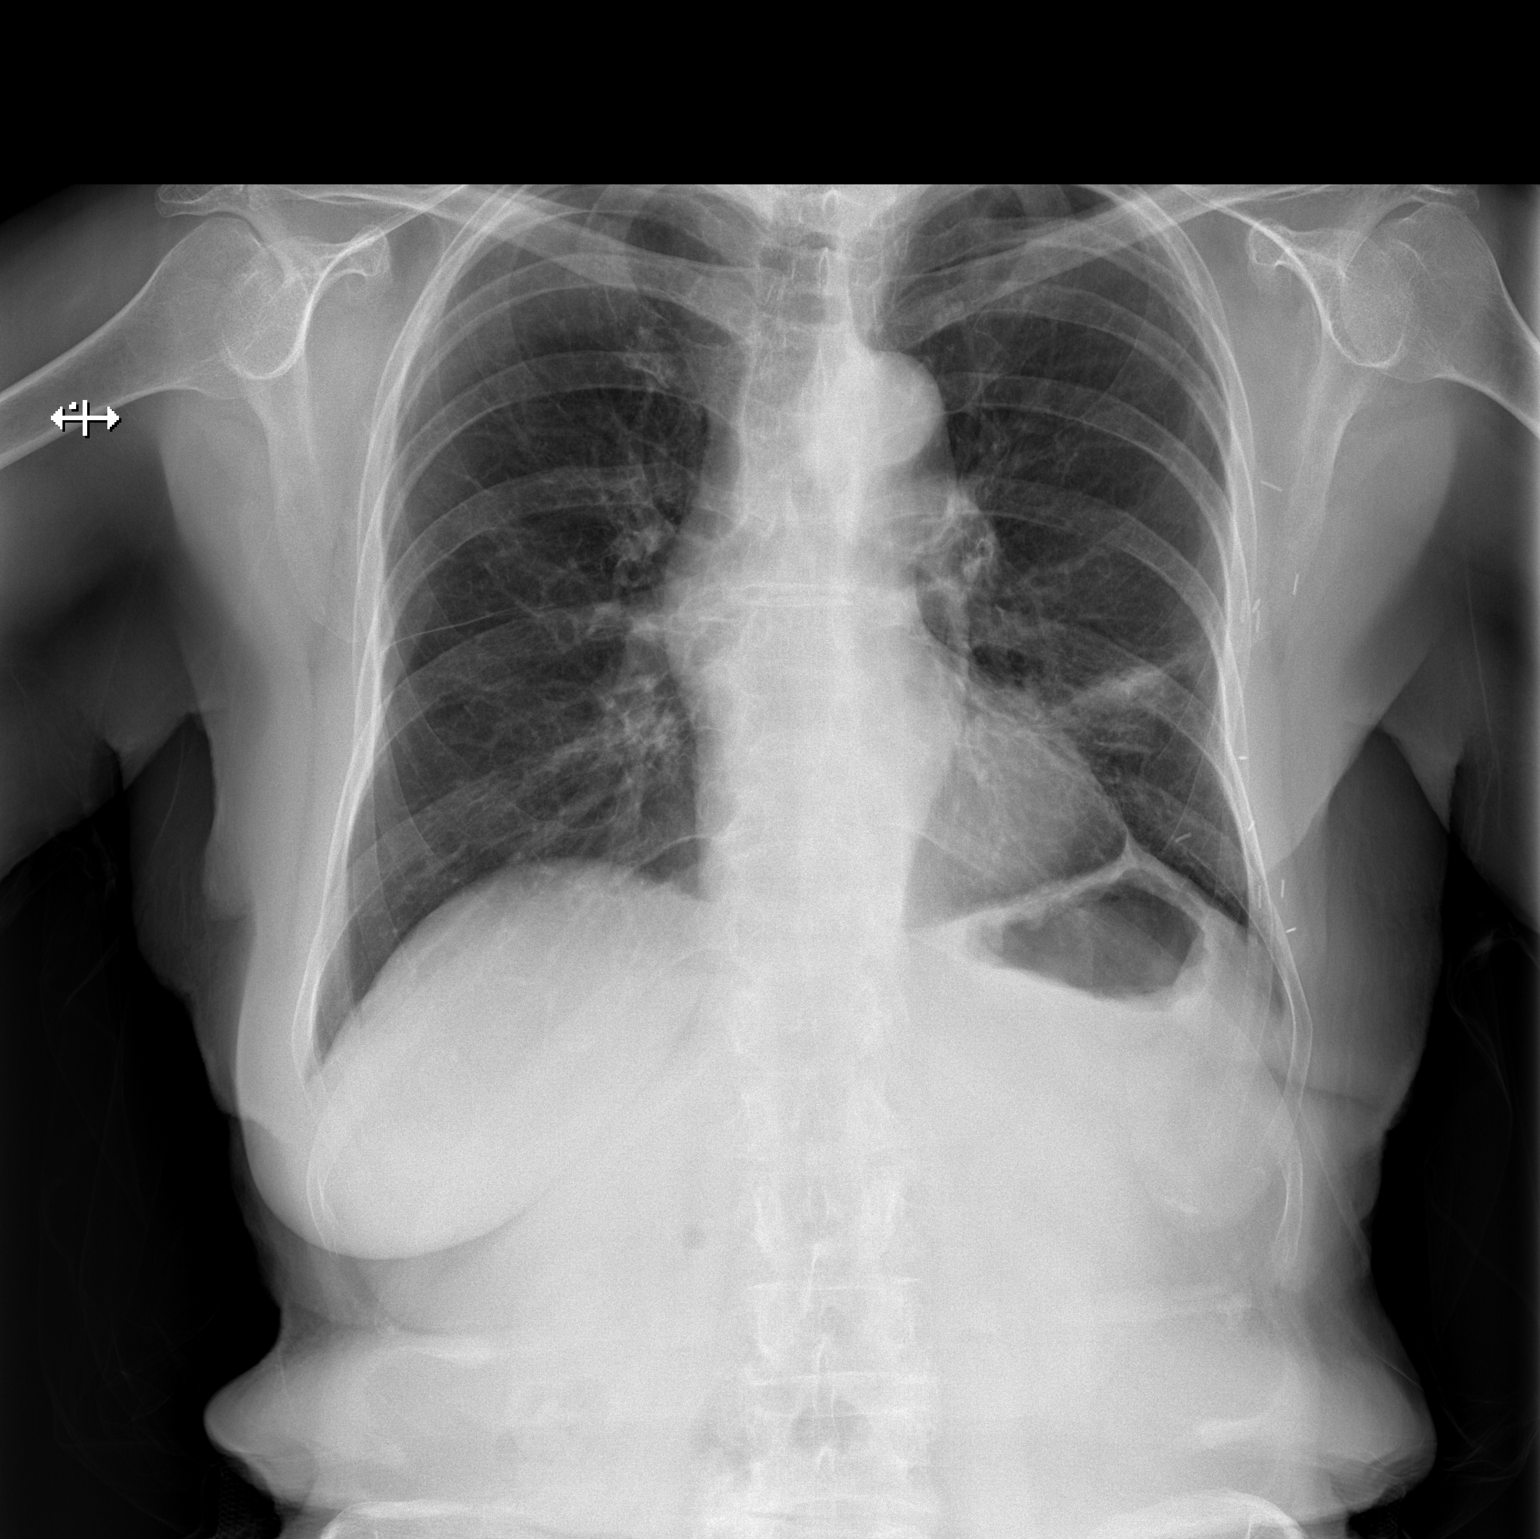

[w chest lat]
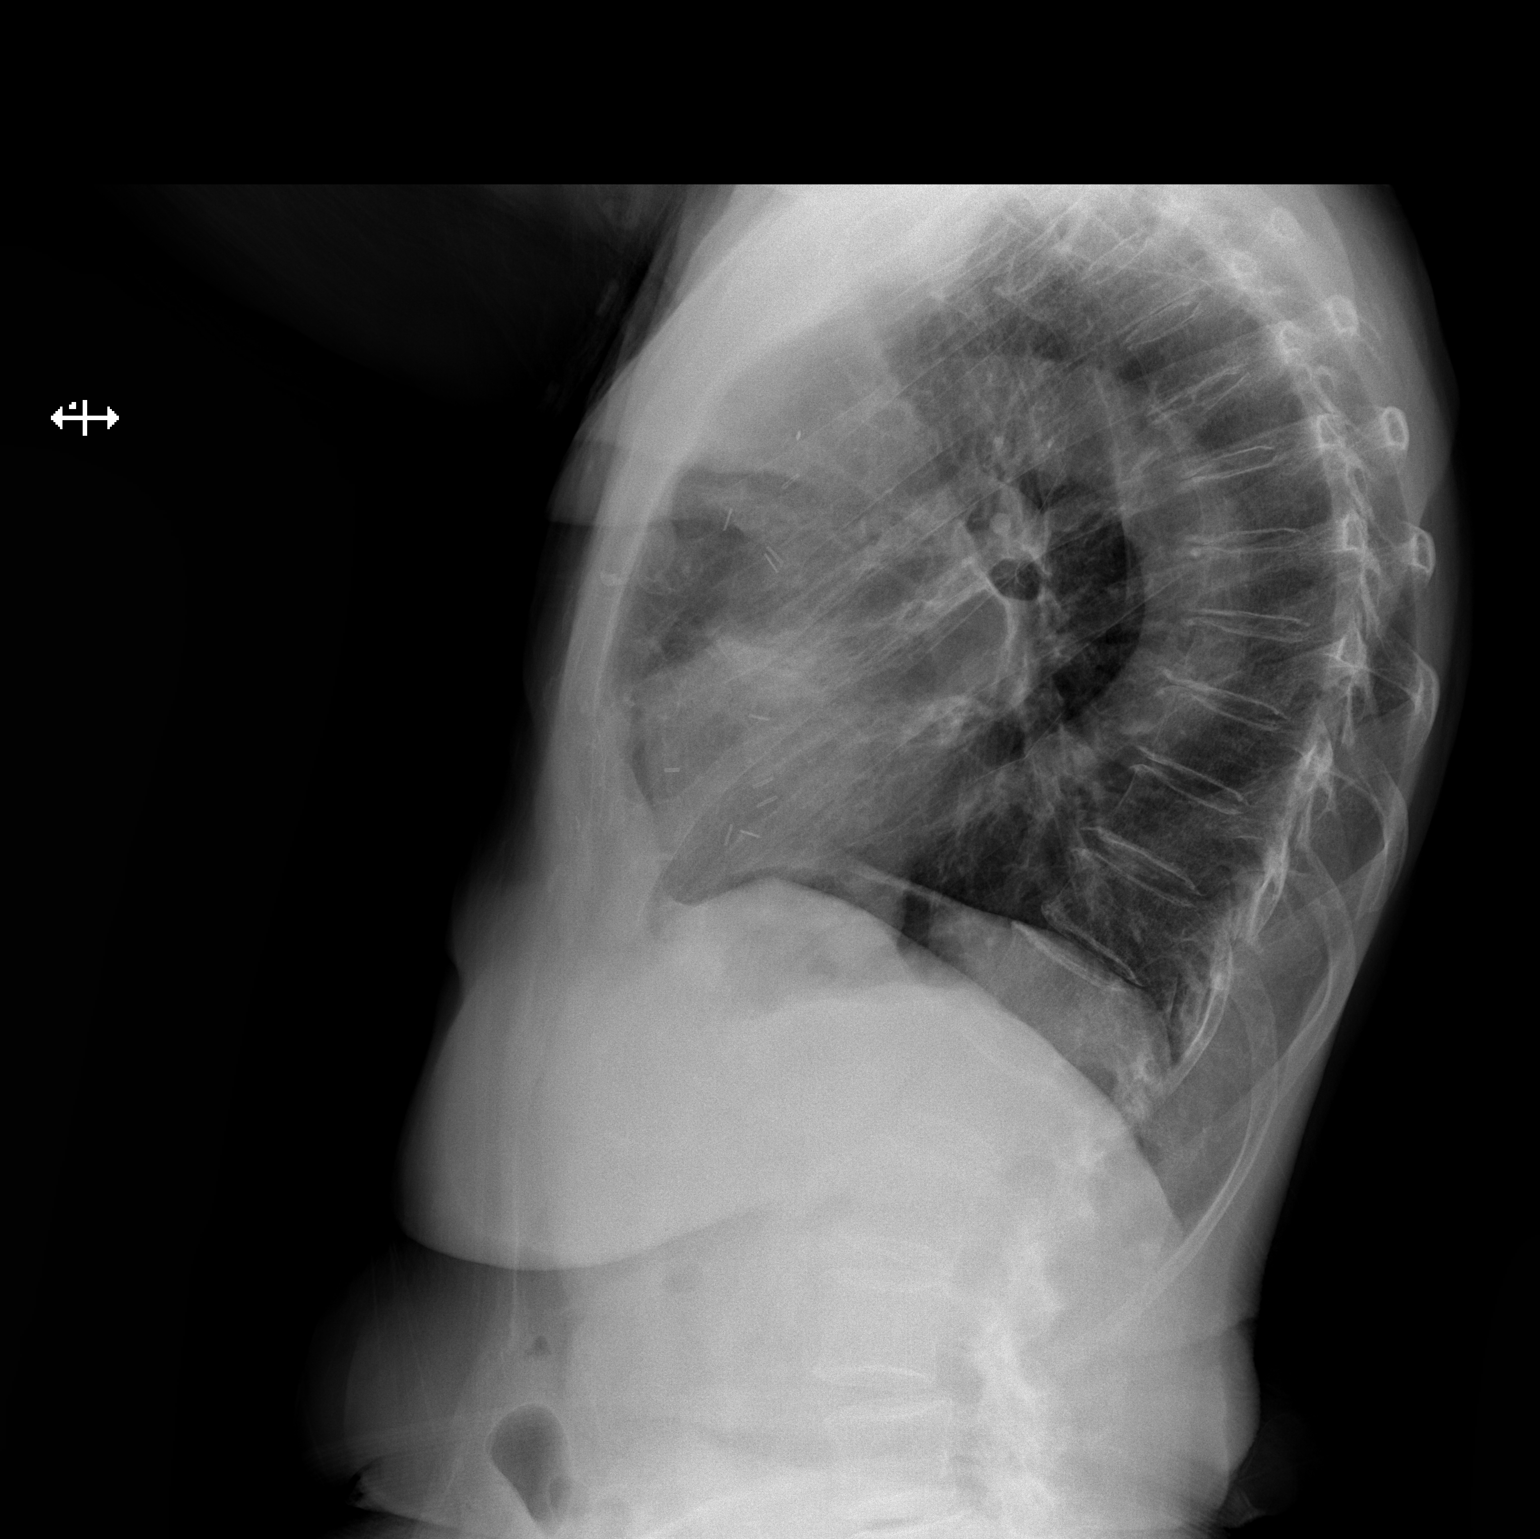

[2 of 2 positions shown; findings below may reference images not displayed]

FINDINGS: Radiation fibrosis in the lingula. There is no edema, consolidation,
effusion, or pneumothorax. Status post left mastectomy and axillary
dissection. Normal heart size and chronic aortic tortuosity.
IMPRESSION: Stable post treatment chest.  No evidence of acute disease.

## 2018-11-25 ENCOUNTER — Other Ambulatory Visit: Payer: Self-pay | Admitting: Internal Medicine

## 2018-11-25 ENCOUNTER — Ambulatory Visit
Admission: RE | Admit: 2018-11-25 | Discharge: 2018-11-25 | Disposition: A | Discharge status: Home / Self Care | Payer: PPO | Source: Ambulatory Visit | Attending: Internal Medicine | Admitting: Internal Medicine

## 2018-11-25 DIAGNOSIS — R0781 Pleurodynia: Secondary | ICD-10-CM

## 2018-11-25 DIAGNOSIS — R140 Abdominal distension (gaseous): Secondary | ICD-10-CM | POA: Diagnosis not present

## 2018-11-26 DIAGNOSIS — N3020 Other chronic cystitis without hematuria: Secondary | ICD-10-CM | POA: Diagnosis not present

## 2018-11-26 DIAGNOSIS — N390 Urinary tract infection, site not specified: Secondary | ICD-10-CM | POA: Diagnosis not present

## 2018-12-03 NOTE — Progress Notes (Signed)
[place]   [phone] [phone]   Clinic Follow up Note   Patient Care Team: Lajean Manes, MD as PCP - [place], [place], MD as Consulting Physician (General Surgery) Truitt Merle, MD as Consulting Physician (Hematology) [name], MD as Consulting Physician (Radiation Oncology) Sylvan Cheese, NP as Nurse Practitioner (Hematology and Oncology) Karen Kays, NP as Nurse Practitioner (Nurse Practitioner) 12/05/2018  CHIEF COMPLAINT: F/u invasive lobular carcinoma of the left breast  SUMMARY OF ONCOLOGIC HISTORY: Oncology History   Cancer of central portion of left female breast [facility])   Staging form: Breast, AJCC 7th Edition     Pathologic stage from 08/04/2010: Stage IIB (T2, N1a, cM0) - Signed by Truitt Merle, MD on 12/09/2015       Cancer of central portion of left female breast ([place])   08/03/2010 Mammogram     left nipple retraction, and the palpable mass subareolar left breast at the 6:00 position. Ultrasound confirmed the presence of a mass measuring 2.6 x 1.7 x 2.4 cm    08/04/2010 Initial Biopsy    left breast mass biopsy showed an invasive mammary carcinoma with lobular features. ER 95%, PR 97%, Ki-67 17%, HER-2/neu (-)    08/04/2010 Pathologic Stage    Stage IIB: T2 N1a    08/11/2010 Imaging    left breast mass 4.6 x 4.2 x 2.3 cm with enhancement distortion extending to the nipple retraction. There is questionable anterior mediastinal lymph node seen    07/2010 - 09/03/2010 Anti-estrogen oral therapy    neoadjuvant letrozole     09/03/2010 Surgery     left breast mastectomy with sentinel node biopsy on 09/03/2010.    08/2010 - 01/2015 Anti-estrogen oral therapy    Tamoxifen, pt stopped on her own     12/03/2010 - 01/23/2011 Radiation Therapy    left chest wall and axilla radiaiton     10/19/2015 Progression    Left chest wall recurrence, s/p resection on 11/25/2015 with positive margins ([facility])     12/10/2015  - 05/31/2017 Anti-estrogen oral therapy    anastrozole 79m daily, which was switched to Tamoxifen on 06/30/2016 due to persistent disease.    01/03/2016 - 03/03/2016 Radiation Therapy    Radiation to left chest wall recurrence (12/06/2015-02/04/2016 - 44 Gy), pt progressed through the radiation, and had additional 10 fractions of radiation (02/21/16-03/03/2016 - total 70 Gy to the left chest wall)    07/14/2016 PET scan    PET 07/14/2016 IMPRESSION: 1. No findings of recurrent malignancy. 2. Radiation fibrosis in the lingula. A small left axillary lymph node is not hypermetabolic and only 6 mm in diameter. This is in the vicinity of the prior axillary cyst. 3. Pelvic floor laxity.    07/17/2016 Surgery    Chest wall soft tissue resection for local recurrence    07/17/2016 Pathology Results    Left chest wall two soft tissue resection both showed invasive lobular carcinoma, margins were positive. ER 95% positive, PR 60% positive, HER-2 negative.    07/27/2016 Imaging    CT chest, abdomen and pelvis wo contrast  07/27/2016 IMPRESSION: Sigmoid diverticulosis without acute diverticulitis. No bowel obstruction or acute inflammation. Degenerative disc disease and facet arthropathy of the lower lumbar spine with slight grade 1 anterolisthesis of L4 on L5. Stable appearing simple left-sided renal cysts.    03/01/2017 Mammogram    Mammogram 03/01/2017 IMPRESSION: No mammographic evidence of malignancy. A result letter of this screening mammogram will be mailed directly  to the patient. RECOMMENDATION: Screening mammogram in one year    04/10/2017 Imaging    PET IMPRESSION: 1. Stable exam.  No findings of recurrent malignancy. 2. Changes of external beam radiation again noted within the lingula. 3. Small left axillary lymph node exhibits mild increased uptake. Unchanged from previous exam.    06/07/2017 -  Anti-estrogen oral therapy    Stopped Tamoxifen and Began Faslodex IM injection on 06/07/17      09/19/2017 Imaging    CT CAP WO Contrast 09/19/17 IMPRESSION: 1. Surgical changes from a left mastectomy without CT findings to suggest recurrent chest wall tumor or axillary lymphadenopathy. 2. No findings to suggest metastatic disease involving the neck, chest, abdomen or pelvis or osseous structures. 3. Stable radiation changes involving the lingula.     09/19/2017 Imaging    Bone Scan Whole Body  IMPRESSION: No definite scintigraphic evidence of osseous metastatic disease. Scattered degenerative type uptake as above.    03/11/2018 PET scan    IMPRESSION:  1. No evidence of recurrent disease. 2.  Aortic atherosclerosis     09/03/2018 PET scan    09/03/2018 PET Scan IMPRESSION: 1. No findings to suggest locally recurrent disease or metastatic disease in the neck, chest, abdomen or pelvis. 2. Hepatic steatosis. 3. Colonic diverticulosis without evidence of acute diverticulitis at this time. 4. Aortic atherosclerosis. 5. Additional incidental findings, as above.    11/25/2018 Imaging    DG Ribs Unilateral W/Chest Left  IMPRESSION: Stable left lingular scarring. Normal left ribs. No acute cardiopulmonary abnormality seen.      CURRENT THERAPY  monthly Faslodex injection started on 06/07/17    INTERVAL HISTORY: Gloria [name] is a 85 y.o. female who is here for follow-up. Since her last visit she was re enrolled in [employer]. Today, she is here alone. She had a UTI recently and finished medication 2 days ago and is feeling better. She describes that she recently had itchiness around her bellybutton but she took probiotics. She has intermettent back pain and recently she has been feeling dizzy when she stands up. Pt is eating well and denies feeling bloated.      Pertinent positives and negatives of review of systems are listed and detailed within the above HPI.  REVIEW OF SYSTEMS:   Constitutional: Denies fevers, chills or abnormal weight loss, (+)  dizziness when she stands up  Eyes: Denies blurriness of vision Ears, nose, mouth, throat, and face: Denies mucositis or sore throat Respiratory: Denies cough, dyspnea or wheezes Cardiovascular: Denies palpitation, chest discomfort or lower extremity swelling Gastrointestinal:  Denies nausea, heartburn or change in bowel habits Skin: Denies abnormal skin rashes Lymphatics: Denies new lymphadenopathy or easy bruising Neurological:Denies numbness, tingling or new weaknesses Behavioral/Psych: Mood is stable, no new changes  MSK: (+) intermittent back pain  All other systems were reviewed with the patient and are negative.  MEDICAL HISTORY:  Past Medical History:  Diagnosis Date  . Anxiety   . Blood in urine   . CAD (coronary artery disease)    non obstructive by cath  . Cancer [facility])    breast- left  . Depression   . GERD (gastroesophageal reflux disease)   . Hypertension   . Knee fracture   . Personal history of radiation therapy 2017  . TR (tricuspid regurgitation)    mild by Echo 12/2008 EF >55%    SURGICAL HISTORY: Past Surgical History:  Procedure Laterality Date  . ABDOMINAL AORTAGRAM N/A 09/08/2011   Procedure: ABDOMINAL  Maxcine Ham;  Surgeon: Troy Sine, MD;  Location: [facility] CATH LAB;  Service: Cardiovascular;  Laterality: N/A;  . ABDOMINAL HYSTERECTOMY    . BREAST EXCISIONAL BIOPSY Left 2017   X3  . BREAST SURGERY  08-30-10   mastectomy  . CARDIAC CATHETERIZATION  08/2011   20% LAD stenosis, 20% diagonal stenosis, 10-20% proximal dominant RCA stenosis  . COLON SURGERY    . LEFT HEART CATHETERIZATION WITH CORONARY ANGIOGRAM N/A 09/08/2011   Procedure: LEFT HEART CATHETERIZATION WITH CORONARY ANGIOGRAM;  Surgeon: Troy Sine, MD;  Location: [facility] CATH LAB;  Service: Cardiovascular;  Laterality: N/A;  . MASTECTOMY    . MINOR BREAST BIOPSY Left 11/25/2015   Procedure: MINOR EXCISION MASS LEFT CHEST WALL;  Surgeon: Autumn Messing III, MD;  Location: [facility];   Service: General;  Laterality: Left;  . MINOR BREAST BIOPSY Left 07/17/2016   Procedure: EXCISION OF 2 LEFT CHEST WALL MASSES;  Surgeon: Autumn Messing III, MD;  Location: [place];  Service: General;  Laterality: Left;  . TONSILLECTOMY      I have reviewed the social history and family history with the patient and they are unchanged from previous note.  ALLERGIES:  is allergic to contrast media [iodinated diagnostic agents]; penicillins; adhesive [tape]; cephalexin; ciprofloxacin; demerol; sulfamethoxazole-trimethoprim; and quinolones.  MEDICATIONS:  Current Outpatient Medications  Medication Sig Dispense Refill  . acetaminophen (TYLENOL) 500 MG tablet Take 1,000 mg by mouth daily as needed for moderate pain. Reported on 03/10/2016    . ALPRAZolam (XANAX) 0.25 MG tablet Take 1/2 tablet by mouth in the morning and 1 tab at bedtime as needed 45 tablet 1  . aspirin 81 MG chewable tablet Chew 81 mg by mouth once a week. Reported on 03/10/2016    . CALCIUM-MAGNESIUM-VITAMIN D PO Take 1 tablet by mouth 2 (two) times daily.    . cetirizine (ZYRTEC) 10 MG tablet Take 10 mg by mouth as needed for allergies.     . diphenhydrAMINE (BENADRYL) 50 MG tablet Take 1 tablet (50 mg total) by mouth as directed. Take Benadryl 50 mg po 1 hour before CT scan. 1 tablet 0  . doxepin (SINEQUAN) 10 MG capsule Take 10 mg by mouth at bedtime.     . fluconazole (DIFLUCAN) 100 MG tablet Take 1 tablet (100 mg total) by mouth daily. 1 tablet 0  . fluticasone (FLONASE) 50 MCG/ACT nasal spray Place 1 spray into both nostrils daily as needed for allergies.     Marland Kitchen guaiFENesin (MUCINEX) 600 MG 12 hr tablet Take 600 mg by mouth daily as needed for cough or to loosen phlegm. Reported on 03/28/2016    . lidocaine-prilocaine (EMLA) cream Apply 1 application topically 2 (two) times daily as needed. 30 g 1  . losartan (COZAAR) 25 MG tablet Take 25 mg by mouth daily.   0  . nitroGLYCERIN (NITROSTAT) 0.4 MG SL tablet Place 1  tablet (0.4 mg total) under the tongue every 5 (five) minutes as needed for chest pain. <PLEASE MAKE APPOINTMENT> 25 tablet 3  . potassium chloride (K-DUR) 10 MEQ tablet Take 10 mEq by mouth as needed.     . pseudoephedrine-acetaminophen (TYLENOL SINUS) 30-500 MG TABS tablet Take 1 tablet by mouth every 4 (four) hours as needed.    . triamcinolone (KENALOG) 0.025 % cream Apply 1 application topically 2 (two) times daily. 30 g 0   No current facility-administered medications for this visit.    Facility-Administered Medications Ordered in Other Visits  Medication Dose Route Frequency Provider Last Rate Last Dose  . fulvestrant (FASLODEX) injection 500 mg  500 mg Intramuscular Q30 days Truitt Merle, MD   500 mg at 12/05/18 1210    PHYSICAL EXAMINATION: ECOG PERFORMANCE STATUS: 1 - Symptomatic but completely ambulatory  Vitals:   12/05/18 1201 12/05/18 1202  BP: 132/86 (!) 141/90  Pulse: 80 87  Resp: 16 17  Temp:    SpO2: 98% 97%   Filed Weights   12/05/18 1136  Weight: 130 lb 1.6 oz (59 kg)    GENERAL:alert, no distress and comfortable SKIN: skin color, texture, turgor are normal, no rashes or significant lesions EYES: normal, Conjunctiva are pink and non-injected, sclera clear OROPHARYNX:no exudate, no erythema and lips, buccal mucosa, and tongue normal  NECK: supple, thyroid normal size, non-tender, without nodularity LYMPH:  no palpable lymphadenopathy in the cervical, axillary or inguinal LUNGS: clear to auscultation and percussion with normal breathing effort HEART: regular rate & rhythm and no murmurs and no lower extremity edema ABDOMEN:abdomen soft, non-tender and normal bowel sounds Musculoskeletal:no cyanosis of digits and no clubbing  NEURO: alert & oriented x 3 with fluent speech, no focal motor/sensory deficits BREAST: no palpable masses (+) scar tissue, (+) mild tenderness   LABORATORY DATA:  I have reviewed the data as listed CBC Latest Ref Rng & Units 12/05/2018  10/31/2018 10/03/2018  WBC 4.0 - 10.5 K/uL 3.6(L) 3.4(L) 7.1  Hemoglobin 12.0 - 15.0 g/dL 14.3 13.5 13.4  Hematocrit 36.0 - 46.0 % 44.3 42.4 41.1  Platelets 150 - 400 K/uL 150 149(L) 160     CMP Latest Ref Rng & Units 12/05/2018 10/31/2018 10/03/2018  Glucose 70 - 99 mg/dL 124(H) 94 111(H)  BUN 8 - 23 mg/dL 16 12 25(H)  Creatinine 0.44 - 1.00 mg/dL 0.90 0.89 0.98  Sodium 135 - 145 mmol/L 143 143 141  Potassium 3.5 - 5.1 mmol/L 3.8 3.8 3.8  Chloride 98 - 111 mmol/L 105 108 108  CO2 22 - 32 mmol/L _0 Calcium 8.9 - 10.3 mg/dL 9.7 9.3 9.1  Total Protein 6.5 - 8.1 g/dL 6.8 6.6 6.3(L)  Total Bilirubin 0.3 - 1.2 mg/dL 0.8 0.6 0.7  Alkaline Phos 38 - 126 U/L 101 91 85  AST 15 - 41 U/L _1 ALT 0 - 44 U/L _2 Tumor Marker: CA 27.29 11/22/17: 32.3 12/20/17: 25.9 01/17/18: 29.4 03/14/18: 31.9 04/11/18: 55.4 05/09/18: 57.3 07/04/18: 40.8 10/31/18: 39.0  RADIOGRAPHIC STUDIES: I have personally reviewed the radiological images as listed and agreed with the findings in the report.  11/25/2018 DG Ribs Unilateral W/Chest Left  IMPRESSION: Stable left lingular scarring. Normal left ribs. No acute cardiopulmonary abnormality seen.   ASSESSMENT & PLAN:  Gloria [name] is a 85 y.o. female with history of  1. Cancer of the central portion of left female breast, Stage IIB, T2 N1 invasive lobular carcinoma, grade 2, ER 95%, PR 97%, Ki-67 17%, HER-2/neu no amplification, diagnosed in 07/2010, local chest wall recurrence in 09/2015   - She was diagnosed in 07/2010 but had chest wall local recurrence in 09/2015 .She was on adjuvant tamoxifen for 4.5 years, stopped on her own and developed local recurrence 8 months after she stopped tamoxifen. -I previously reviewed her surgical pathology findings, which is consistent with lobular carcinoma recurrence from her previous breast cancer. -The tumor was surgically removed, with positive margins. -She has completed chest wall radiation, however  developed local recurrence again and  underwent a second chest wall surgery for two soft tissue resection in 06/2016. Unfortunately the surgical margins were positive. I spoke with Dr. Marlou Starks, no more surgery will be offered, because complete surgical resection is unlikely. Patient is also not in favor of more extensive surgery. -I have switched her to fulvestrant injection on 06/07/17 due to clinical suspicion for disease progress  -She has been tolerating Faslodex injections well overall, with some pain at injection sites  -Due to possible anaphylactic reaction, she will not receive CT contrast with scans.  -Last estaging PET from 09/03/2018 reviewed with pt and her daughter, showed no hypermetabolic lesion -She is clinically doing well, mild chest wall tenderness at the mastectomy site is stable, exam showed stable mild nodules at the incision site, no significant change.  Lab reviewed, CBC and CMP unremarkable, no clinical concern for cancer progression.  we will continue fulvestrant injection monthly -Follow-up in 3 months with a PET scan a few days before.   2. HTN, CAD   -She'll continue follow-up with her primary care physician and cardiologist -I previously encouraged her to monitor her BP at home and if remains elevated she should see her PCP. She is on Losartan. -She complains of dizziness when she stands up, we checked orthostatic blood pressure, which was normal.   3. Osteoporosis  - her last DEXA scan in 06/2012 showed osteoporosis  - I previously encouraged her to continue calcium and Vit D - I previously encouraged her to repeat DEXA at her PCP office   4. Left Chest Pain  -Secondary to the small tumor nodules on the chest wall. -Since she has been having continued pain, I did offer her Gabapentin; she declined this for now. I encouraged her to take Tylenol and Ibuprofen for further management. -overall stable and tolerable  5. Goal of care discussion  -We again discussed the  incurable nature of her cancer, and the overall poor prognosis, especially if he/she does not have good response to chemotherapy or progress on chemo -The patient understands the goal of care is palliative. -she is full code for now    Plan  - We will continue monthly fulvestrant injection  - I will order a PET scan before next visit  - F/u in 3 months   No problem-specific Assessment & Plan notes found for this encounter.   Orders Placed This Encounter  Procedures  . NM PET Image Restag (PS) Skull Base To Thigh    Standing Status:   Future    Standing Expiration Date:   12/05/2019    Order Specific Question:   If indicated for the ordered procedure, I authorize the administration of a radiopharmaceutical per Radiology protocol    Answer:   Yes    Order Specific Question:   Preferred imaging location?    Answer:   [facility]    Order Specific Question:   Radiology Contrast Protocol - do NOT remove file path    Answer:   [email or web address]   All questions were answered. The patient knows to call the clinic with any problems, questions or concerns. No barriers to learning was detected. I spent 20 minutes counseling the patient face to face. The total time spent in the appointment was 25 minutes and more than 50% was on counseling and review of test results  I, Manson Allan am acting as scribe for Dr. Truitt Merle.  I have reviewed the above documentation for accuracy and completeness, and I agree with the above.  Truitt Merle, MD 12/05/2018

## 2018-12-05 ENCOUNTER — Encounter: Payer: Self-pay | Admitting: Hematology

## 2018-12-05 ENCOUNTER — Inpatient Hospital Stay: Payer: PPO | Attending: Hematology

## 2018-12-05 ENCOUNTER — Inpatient Hospital Stay (HOSPITAL_BASED_OUTPATIENT_CLINIC_OR_DEPARTMENT_OTHER): Payer: PPO | Admitting: Hematology

## 2018-12-05 ENCOUNTER — Inpatient Hospital Stay: Payer: PPO

## 2018-12-05 VITALS — BP 141/90 | HR 87 | Temp 98.1°F | Resp 17 | Ht 60.0 in | Wt 130.1 lb

## 2018-12-05 DIAGNOSIS — I10 Essential (primary) hypertension: Secondary | ICD-10-CM | POA: Diagnosis not present

## 2018-12-05 DIAGNOSIS — Z170 Estrogen receptor positive status [ER+]: Secondary | ICD-10-CM | POA: Diagnosis not present

## 2018-12-05 DIAGNOSIS — I2510 Atherosclerotic heart disease of native coronary artery without angina pectoris: Secondary | ICD-10-CM | POA: Insufficient documentation

## 2018-12-05 DIAGNOSIS — Z9012 Acquired absence of left breast and nipple: Secondary | ICD-10-CM | POA: Diagnosis not present

## 2018-12-05 DIAGNOSIS — R42 Dizziness and giddiness: Secondary | ICD-10-CM | POA: Insufficient documentation

## 2018-12-05 DIAGNOSIS — M549 Dorsalgia, unspecified: Secondary | ICD-10-CM | POA: Insufficient documentation

## 2018-12-05 DIAGNOSIS — Z7982 Long term (current) use of aspirin: Secondary | ICD-10-CM

## 2018-12-05 DIAGNOSIS — R0789 Other chest pain: Secondary | ICD-10-CM | POA: Insufficient documentation

## 2018-12-05 DIAGNOSIS — C50112 Malignant neoplasm of central portion of left female breast: Secondary | ICD-10-CM | POA: Insufficient documentation

## 2018-12-05 DIAGNOSIS — Z79899 Other long term (current) drug therapy: Secondary | ICD-10-CM

## 2018-12-05 LAB — COMPREHENSIVE METABOLIC PANEL
ALT: 14 U/L (ref 0–44)
AST: 19 U/L (ref 15–41)
Albumin: 3.9 g/dL (ref 3.5–5.0)
Alkaline Phosphatase: 101 U/L (ref 38–126)
Anion gap: 9 (ref 5–15)
BUN: 16 mg/dL (ref 8–23)
CO2: 29 mmol/L (ref 22–32)
Calcium: 9.7 mg/dL (ref 8.9–10.3)
Chloride: 105 mmol/L (ref 98–111)
Creatinine, Ser: 0.9 mg/dL (ref 0.44–1.00)
GFR calc Af Amer: >60 mL/min (ref >60)
GFR calc non Af Amer: 57 mL/min — ABNORMAL LOW (ref >60)
Glucose, Bld: 124 mg/dL — ABNORMAL HIGH (ref 70–99)
Potassium: 3.8 mmol/L (ref 3.5–5.1)
SODIUM: 143 mmol/L (ref 135–145)
Total Bilirubin: 0.8 mg/dL (ref 0.3–1.2)
Total Protein: 6.8 g/dL (ref 6.5–8.1)

## 2018-12-05 LAB — CBC WITH DIFFERENTIAL/PLATELET
Abs Immature Granulocytes: 0.01 10*3/uL (ref 0.00–0.07)
Basophils Absolute: 0.1 10*3/uL (ref 0.0–0.1)
Basophils Relative: 1 %
Eosinophils Absolute: 0.3 10*3/uL (ref 0.0–0.5)
Eosinophils Relative: 8 %
HEMATOCRIT: 44.3 % (ref 36.0–46.0)
Hemoglobin: 14.3 g/dL (ref 12.0–15.0)
Immature Granulocytes: 0 %
LYMPHS ABS: 0.6 10*3/uL — AB (ref 0.7–4.0)
Lymphocytes Relative: 18 %
MCH: 31.2 pg (ref 26.0–34.0)
MCHC: 32.3 g/dL (ref 30.0–36.0)
MCV: 96.5 fL (ref 80.0–100.0)
MONOS PCT: 8 %
Monocytes Absolute: 0.3 10*3/uL (ref 0.1–1.0)
Neutro Abs: 2.4 10*3/uL (ref 1.7–7.7)
Neutrophils Relative %: 65 %
Platelets: 150 10*3/uL (ref 150–400)
RBC: 4.59 MIL/uL (ref 3.87–5.11)
RDW: 13 % (ref 11.5–15.5)
WBC: 3.6 10*3/uL — ABNORMAL LOW (ref 4.0–10.5)
nRBC: 0 % (ref 0.0–0.2)

## 2018-12-05 MED ORDER — FULVESTRANT 250 MG/5ML IM SOLN
INTRAMUSCULAR | Status: AC
  Filled 2018-12-05: qty 10

## 2018-12-05 MED ORDER — FULVESTRANT 250 MG/5ML IM SOLN
500.0000 mg | INTRAMUSCULAR | Status: DC
  Administered 2018-12-05: 500 mg via INTRAMUSCULAR

## 2018-12-05 NOTE — Patient Instructions (Signed)

## 2018-12-06 LAB — CANCER ANTIGEN 27.29: CA 27.29: 41.1 U/mL — ABNORMAL HIGH (ref 0.0–38.6)

## 2018-12-20 ENCOUNTER — Telehealth: Payer: Self-pay | Admitting: Hematology

## 2018-12-20 NOTE — Telephone Encounter (Signed)
Patient called her injections were not scheduled

## 2019-01-02 ENCOUNTER — Inpatient Hospital Stay: Payer: PPO | Attending: Hematology

## 2019-01-02 VITALS — BP 155/85 | HR 68 | Resp 18

## 2019-01-02 DIAGNOSIS — Z170 Estrogen receptor positive status [ER+]: Secondary | ICD-10-CM | POA: Diagnosis not present

## 2019-01-02 DIAGNOSIS — C50112 Malignant neoplasm of central portion of left female breast: Secondary | ICD-10-CM | POA: Insufficient documentation

## 2019-01-02 DIAGNOSIS — Z79818 Long term (current) use of other agents affecting estrogen receptors and estrogen levels: Secondary | ICD-10-CM | POA: Insufficient documentation

## 2019-01-02 MED ORDER — FULVESTRANT 250 MG/5ML IM SOLN
500.0000 mg | INTRAMUSCULAR | Status: DC
  Administered 2019-01-02: 500 mg via INTRAMUSCULAR

## 2019-01-02 MED ORDER — FULVESTRANT 250 MG/5ML IM SOLN
INTRAMUSCULAR | Status: AC
  Filled 2019-01-02: qty 5

## 2019-01-02 NOTE — Patient Instructions (Signed)

## 2019-01-14 ENCOUNTER — Other Ambulatory Visit: Payer: Self-pay | Admitting: Hematology

## 2019-01-14 DIAGNOSIS — C50112 Malignant neoplasm of central portion of left female breast: Secondary | ICD-10-CM

## 2019-01-14 DIAGNOSIS — Z170 Estrogen receptor positive status [ER+]: Principal | ICD-10-CM

## 2019-01-14 MED ORDER — ALPRAZOLAM 0.25 MG PO TABS: ORAL_TABLET | ORAL | 1 refills | Status: DC

## 2019-01-17 DIAGNOSIS — S81812A Laceration without foreign body, left lower leg, initial encounter: Secondary | ICD-10-CM | POA: Diagnosis not present

## 2019-01-23 DIAGNOSIS — K5790 Diverticulosis of intestine, part unspecified, without perforation or abscess without bleeding: Secondary | ICD-10-CM | POA: Diagnosis not present

## 2019-01-23 DIAGNOSIS — H3530 Unspecified macular degeneration: Secondary | ICD-10-CM | POA: Diagnosis not present

## 2019-01-23 DIAGNOSIS — Z9181 History of falling: Secondary | ICD-10-CM | POA: Diagnosis not present

## 2019-01-23 DIAGNOSIS — K760 Fatty (change of) liver, not elsewhere classified: Secondary | ICD-10-CM | POA: Diagnosis not present

## 2019-01-23 DIAGNOSIS — I10 Essential (primary) hypertension: Secondary | ICD-10-CM | POA: Diagnosis not present

## 2019-01-23 DIAGNOSIS — M810 Age-related osteoporosis without current pathological fracture: Secondary | ICD-10-CM | POA: Diagnosis not present

## 2019-01-23 DIAGNOSIS — D696 Thrombocytopenia, unspecified: Secondary | ICD-10-CM | POA: Diagnosis not present

## 2019-01-23 DIAGNOSIS — Z7982 Long term (current) use of aspirin: Secondary | ICD-10-CM | POA: Diagnosis not present

## 2019-01-23 DIAGNOSIS — S81812A Laceration without foreign body, left lower leg, initial encounter: Secondary | ICD-10-CM | POA: Diagnosis not present

## 2019-01-23 DIAGNOSIS — J449 Chronic obstructive pulmonary disease, unspecified: Secondary | ICD-10-CM | POA: Diagnosis not present

## 2019-01-23 DIAGNOSIS — Z853 Personal history of malignant neoplasm of breast: Secondary | ICD-10-CM | POA: Diagnosis not present

## 2019-01-27 DIAGNOSIS — S81812A Laceration without foreign body, left lower leg, initial encounter: Secondary | ICD-10-CM | POA: Diagnosis not present

## 2019-01-27 DIAGNOSIS — K760 Fatty (change of) liver, not elsewhere classified: Secondary | ICD-10-CM | POA: Diagnosis not present

## 2019-01-27 DIAGNOSIS — Z7982 Long term (current) use of aspirin: Secondary | ICD-10-CM | POA: Diagnosis not present

## 2019-01-27 DIAGNOSIS — H3530 Unspecified macular degeneration: Secondary | ICD-10-CM | POA: Diagnosis not present

## 2019-01-27 DIAGNOSIS — Z9181 History of falling: Secondary | ICD-10-CM | POA: Diagnosis not present

## 2019-01-27 DIAGNOSIS — Z853 Personal history of malignant neoplasm of breast: Secondary | ICD-10-CM | POA: Diagnosis not present

## 2019-01-27 DIAGNOSIS — D696 Thrombocytopenia, unspecified: Secondary | ICD-10-CM | POA: Diagnosis not present

## 2019-01-27 DIAGNOSIS — M810 Age-related osteoporosis without current pathological fracture: Secondary | ICD-10-CM | POA: Diagnosis not present

## 2019-01-27 DIAGNOSIS — I10 Essential (primary) hypertension: Secondary | ICD-10-CM | POA: Diagnosis not present

## 2019-01-27 DIAGNOSIS — J449 Chronic obstructive pulmonary disease, unspecified: Secondary | ICD-10-CM | POA: Diagnosis not present

## 2019-01-27 DIAGNOSIS — K5790 Diverticulosis of intestine, part unspecified, without perforation or abscess without bleeding: Secondary | ICD-10-CM | POA: Diagnosis not present

## 2019-01-30 ENCOUNTER — Encounter: Payer: Self-pay | Admitting: General Practice

## 2019-01-30 ENCOUNTER — Inpatient Hospital Stay: Payer: PPO | Attending: Hematology

## 2019-01-30 ENCOUNTER — Other Ambulatory Visit: Payer: Self-pay

## 2019-01-30 DIAGNOSIS — R079 Chest pain, unspecified: Secondary | ICD-10-CM | POA: Diagnosis not present

## 2019-01-30 DIAGNOSIS — Z79811 Long term (current) use of aromatase inhibitors: Secondary | ICD-10-CM | POA: Insufficient documentation

## 2019-01-30 DIAGNOSIS — Z853 Personal history of malignant neoplasm of breast: Secondary | ICD-10-CM | POA: Diagnosis not present

## 2019-01-30 DIAGNOSIS — F419 Anxiety disorder, unspecified: Secondary | ICD-10-CM | POA: Insufficient documentation

## 2019-01-30 DIAGNOSIS — I10 Essential (primary) hypertension: Secondary | ICD-10-CM | POA: Insufficient documentation

## 2019-01-30 DIAGNOSIS — Z9012 Acquired absence of left breast and nipple: Secondary | ICD-10-CM | POA: Diagnosis not present

## 2019-01-30 DIAGNOSIS — M5136 Other intervertebral disc degeneration, lumbar region: Secondary | ICD-10-CM | POA: Insufficient documentation

## 2019-01-30 DIAGNOSIS — I700 Atherosclerosis of aorta: Secondary | ICD-10-CM | POA: Diagnosis not present

## 2019-01-30 DIAGNOSIS — Z923 Personal history of irradiation: Secondary | ICD-10-CM | POA: Insufficient documentation

## 2019-01-30 DIAGNOSIS — S81812A Laceration without foreign body, left lower leg, initial encounter: Secondary | ICD-10-CM | POA: Diagnosis not present

## 2019-01-30 DIAGNOSIS — C50112 Malignant neoplasm of central portion of left female breast: Secondary | ICD-10-CM | POA: Diagnosis not present

## 2019-01-30 DIAGNOSIS — K760 Fatty (change of) liver, not elsewhere classified: Secondary | ICD-10-CM | POA: Diagnosis not present

## 2019-01-30 DIAGNOSIS — H3530 Unspecified macular degeneration: Secondary | ICD-10-CM | POA: Diagnosis not present

## 2019-01-30 DIAGNOSIS — K5790 Diverticulosis of intestine, part unspecified, without perforation or abscess without bleeding: Secondary | ICD-10-CM | POA: Diagnosis not present

## 2019-01-30 DIAGNOSIS — M810 Age-related osteoporosis without current pathological fracture: Secondary | ICD-10-CM | POA: Diagnosis not present

## 2019-01-30 DIAGNOSIS — Z8719 Personal history of other diseases of the digestive system: Secondary | ICD-10-CM | POA: Diagnosis not present

## 2019-01-30 DIAGNOSIS — Z9181 History of falling: Secondary | ICD-10-CM | POA: Diagnosis not present

## 2019-01-30 DIAGNOSIS — K219 Gastro-esophageal reflux disease without esophagitis: Secondary | ICD-10-CM | POA: Insufficient documentation

## 2019-01-30 DIAGNOSIS — Z79899 Other long term (current) drug therapy: Secondary | ICD-10-CM | POA: Insufficient documentation

## 2019-01-30 DIAGNOSIS — Z170 Estrogen receptor positive status [ER+]: Secondary | ICD-10-CM | POA: Insufficient documentation

## 2019-01-30 DIAGNOSIS — R1310 Dysphagia, unspecified: Secondary | ICD-10-CM | POA: Diagnosis not present

## 2019-01-30 DIAGNOSIS — D696 Thrombocytopenia, unspecified: Secondary | ICD-10-CM | POA: Diagnosis not present

## 2019-01-30 DIAGNOSIS — I2510 Atherosclerotic heart disease of native coronary artery without angina pectoris: Secondary | ICD-10-CM | POA: Diagnosis not present

## 2019-01-30 DIAGNOSIS — Z7982 Long term (current) use of aspirin: Secondary | ICD-10-CM | POA: Diagnosis not present

## 2019-01-30 DIAGNOSIS — Z79818 Long term (current) use of other agents affecting estrogen receptors and estrogen levels: Secondary | ICD-10-CM | POA: Diagnosis not present

## 2019-01-30 DIAGNOSIS — J449 Chronic obstructive pulmonary disease, unspecified: Secondary | ICD-10-CM | POA: Diagnosis not present

## 2019-01-30 MED ORDER — FULVESTRANT 250 MG/5ML IM SOLN
INTRAMUSCULAR | Status: AC
  Filled 2019-01-30: qty 5

## 2019-01-30 MED ORDER — FULVESTRANT 250 MG/5ML IM SOLN
500.0000 mg | Freq: Once | INTRAMUSCULAR | Status: AC
  Administered 2019-01-30: 11:00:00 500 mg via INTRAMUSCULAR

## 2019-01-30 NOTE — Patient Instructions (Signed)

## 2019-01-30 NOTE — Progress Notes (Signed)
[facility] Support Services   [facility] Support Services Team contacted patient to assess for food insecurity and other psychosocial needs during current COVID19 pandemic.  No answer, no VM.   Clerance Umland C Lynnwood Beckford, LCSW  [facility]        

## 2019-02-03 DIAGNOSIS — K760 Fatty (change of) liver, not elsewhere classified: Secondary | ICD-10-CM | POA: Diagnosis not present

## 2019-02-03 DIAGNOSIS — J449 Chronic obstructive pulmonary disease, unspecified: Secondary | ICD-10-CM | POA: Diagnosis not present

## 2019-02-03 DIAGNOSIS — I10 Essential (primary) hypertension: Secondary | ICD-10-CM | POA: Diagnosis not present

## 2019-02-03 DIAGNOSIS — K5790 Diverticulosis of intestine, part unspecified, without perforation or abscess without bleeding: Secondary | ICD-10-CM | POA: Diagnosis not present

## 2019-02-03 DIAGNOSIS — Z7982 Long term (current) use of aspirin: Secondary | ICD-10-CM | POA: Diagnosis not present

## 2019-02-03 DIAGNOSIS — H3530 Unspecified macular degeneration: Secondary | ICD-10-CM | POA: Diagnosis not present

## 2019-02-03 DIAGNOSIS — S81812A Laceration without foreign body, left lower leg, initial encounter: Secondary | ICD-10-CM | POA: Diagnosis not present

## 2019-02-03 DIAGNOSIS — Z9181 History of falling: Secondary | ICD-10-CM | POA: Diagnosis not present

## 2019-02-03 DIAGNOSIS — M810 Age-related osteoporosis without current pathological fracture: Secondary | ICD-10-CM | POA: Diagnosis not present

## 2019-02-03 DIAGNOSIS — Z853 Personal history of malignant neoplasm of breast: Secondary | ICD-10-CM | POA: Diagnosis not present

## 2019-02-03 DIAGNOSIS — D696 Thrombocytopenia, unspecified: Secondary | ICD-10-CM | POA: Diagnosis not present

## 2019-02-04 DIAGNOSIS — L97222 Non-pressure chronic ulcer of left calf with fat layer exposed: Secondary | ICD-10-CM | POA: Diagnosis not present

## 2019-02-04 DIAGNOSIS — S81812A Laceration without foreign body, left lower leg, initial encounter: Secondary | ICD-10-CM | POA: Diagnosis not present

## 2019-02-04 DIAGNOSIS — L570 Actinic keratosis: Secondary | ICD-10-CM | POA: Diagnosis not present

## 2019-02-04 DIAGNOSIS — I890 Lymphedema, not elsewhere classified: Secondary | ICD-10-CM | POA: Diagnosis not present

## 2019-02-04 DIAGNOSIS — E11622 Type 2 diabetes mellitus with other skin ulcer: Secondary | ICD-10-CM | POA: Diagnosis not present

## 2019-02-04 DIAGNOSIS — L97829 Non-pressure chronic ulcer of other part of left lower leg with unspecified severity: Secondary | ICD-10-CM | POA: Diagnosis not present

## 2019-02-17 DIAGNOSIS — M810 Age-related osteoporosis without current pathological fracture: Secondary | ICD-10-CM | POA: Diagnosis not present

## 2019-02-17 DIAGNOSIS — Z9181 History of falling: Secondary | ICD-10-CM | POA: Diagnosis not present

## 2019-02-17 DIAGNOSIS — S81812A Laceration without foreign body, left lower leg, initial encounter: Secondary | ICD-10-CM | POA: Diagnosis not present

## 2019-02-17 DIAGNOSIS — I10 Essential (primary) hypertension: Secondary | ICD-10-CM | POA: Diagnosis not present

## 2019-02-17 DIAGNOSIS — Z7982 Long term (current) use of aspirin: Secondary | ICD-10-CM | POA: Diagnosis not present

## 2019-02-17 DIAGNOSIS — J449 Chronic obstructive pulmonary disease, unspecified: Secondary | ICD-10-CM | POA: Diagnosis not present

## 2019-02-17 DIAGNOSIS — K760 Fatty (change of) liver, not elsewhere classified: Secondary | ICD-10-CM | POA: Diagnosis not present

## 2019-02-17 DIAGNOSIS — D696 Thrombocytopenia, unspecified: Secondary | ICD-10-CM | POA: Diagnosis not present

## 2019-02-17 DIAGNOSIS — Z853 Personal history of malignant neoplasm of breast: Secondary | ICD-10-CM | POA: Diagnosis not present

## 2019-02-17 DIAGNOSIS — K5790 Diverticulosis of intestine, part unspecified, without perforation or abscess without bleeding: Secondary | ICD-10-CM | POA: Diagnosis not present

## 2019-02-17 DIAGNOSIS — H3530 Unspecified macular degeneration: Secondary | ICD-10-CM | POA: Diagnosis not present

## 2019-02-20 ENCOUNTER — Telehealth: Payer: Self-pay

## 2019-02-20 NOTE — Telephone Encounter (Signed)
Patient calls reluctant to come into [facility] for PET scan on 5/5, per [name] okay to reschedule for one to two months out, spoke with patient advised her we will cancel 5/5 and reschedule for one to two months out.

## 2019-02-24 NOTE — Progress Notes (Signed)
[place]   [phone] [phone]   Clinic Follow up Note   Patient Care Team: Lajean Manes, MD as PCP - [place], [place], MD as Consulting Physician (General Surgery) Truitt Merle, MD as Consulting Physician (Hematology) [name], MD as Consulting Physician (Radiation Oncology) Sylvan Cheese, NP as Nurse Practitioner (Hematology and Oncology) Karen Kays, NP as Nurse Practitioner (Nurse Practitioner)  Date of Service:  02/27/2019  CHIEF COMPLAINT: F/u invasive lobular carcinoma of the left breast  SUMMARY OF ONCOLOGIC HISTORY: Oncology History   Cancer of central portion of left female breast [facility])   Staging form: Breast, AJCC 7th Edition     Pathologic stage from 08/04/2010: Stage IIB (T2, N1a, cM0) - Signed by Truitt Merle, MD on 12/09/2015       Cancer of central portion of left female breast ([place])   08/03/2010 Mammogram     left nipple retraction, and the palpable mass subareolar left breast at the 6:00 position. Ultrasound confirmed the presence of a mass measuring 2.6 x 1.7 x 2.4 cm    08/04/2010 Initial Biopsy    left breast mass biopsy showed an invasive mammary carcinoma with lobular features. ER 95%, PR 97%, Ki-67 17%, HER-2/neu (-)    08/04/2010 Pathologic Stage    Stage IIB: T2 N1a    08/11/2010 Imaging    left breast mass 4.6 x 4.2 x 2.3 cm with enhancement distortion extending to the nipple retraction. There is questionable anterior mediastinal lymph node seen    07/2010 - 09/03/2010 Anti-estrogen oral therapy    neoadjuvant letrozole     09/03/2010 Surgery     left breast mastectomy with sentinel node biopsy on 09/03/2010.    08/2010 - 01/2015 Anti-estrogen oral therapy    Tamoxifen, pt stopped on her own     12/03/2010 - 01/23/2011 Radiation Therapy    left chest wall and axilla radiaiton     10/19/2015 Progression    Left chest wall recurrence, s/p resection on 11/25/2015 with positive margins  (IL[facility])     12/10/2015 - 05/31/2017 Anti-estrogen oral therapy    anastrozole 72m daily, which was switched to Tamoxifen on 06/30/2016 due to persistent disease.    01/03/2016 - 03/03/2016 Radiation Therapy    Radiation to left chest wall recurrence (12/06/2015-02/04/2016 - 44 Gy), pt progressed through the radiation, and had additional 10 fractions of radiation (02/21/16-03/03/2016 - total 70 Gy to the left chest wall)    07/14/2016 PET scan    PET 07/14/2016 IMPRESSION: 1. No findings of recurrent malignancy. 2. Radiation fibrosis in the lingula. A small left axillary lymph node is not hypermetabolic and only 6 mm in diameter. This is in the vicinity of the prior axillary cyst. 3. Pelvic floor laxity.    07/17/2016 Surgery    Chest wall soft tissue resection for local recurrence    07/17/2016 Pathology Results    Left chest wall two soft tissue resection both showed invasive lobular carcinoma, margins were positive. ER 95% positive, PR 60% positive, HER-2 negative.    07/27/2016 Imaging    CT chest, abdomen and pelvis wo contrast  07/27/2016 IMPRESSION: Sigmoid diverticulosis without acute diverticulitis. No bowel obstruction or acute inflammation. Degenerative disc disease and facet arthropathy of the lower lumbar spine with slight grade 1 anterolisthesis of L4 on L5. Stable appearing simple left-sided renal cysts.    03/01/2017 Mammogram    Mammogram 03/01/2017 IMPRESSION: No mammographic evidence of malignancy. A result letter of this screening  mammogram will be mailed directly to the patient. RECOMMENDATION: Screening mammogram in one year    04/10/2017 Imaging    PET IMPRESSION: 1. Stable exam.  No findings of recurrent malignancy. 2. Changes of external beam radiation again noted within the lingula. 3. Small left axillary lymph node exhibits mild increased uptake. Unchanged from previous exam.    06/07/2017 -  Anti-estrogen oral therapy    Stopped Tamoxifen and Began Faslodex IM injection on  06/07/17     09/19/2017 Imaging    CT CAP WO Contrast 09/19/17 IMPRESSION: 1. Surgical changes from a left mastectomy without CT findings to suggest recurrent chest wall tumor or axillary lymphadenopathy. 2. No findings to suggest metastatic disease involving the neck, chest, abdomen or pelvis or osseous structures. 3. Stable radiation changes involving the lingula.     09/19/2017 Imaging    Bone Scan Whole Body  IMPRESSION: No definite scintigraphic evidence of osseous metastatic disease. Scattered degenerative type uptake as above.    03/11/2018 PET scan    IMPRESSION:  1. No evidence of recurrent disease. 2.  Aortic atherosclerosis     09/03/2018 PET scan    09/03/2018 PET Scan IMPRESSION: 1. No findings to suggest locally recurrent disease or metastatic disease in the neck, chest, abdomen or pelvis. 2. Hepatic steatosis. 3. Colonic diverticulosis without evidence of acute diverticulitis at this time. 4. Aortic atherosclerosis. 5. Additional incidental findings, as above.    11/25/2018 Imaging    DG Ribs Unilateral W/Chest Left  IMPRESSION: Stable left lingular scarring. Normal left ribs. No acute cardiopulmonary abnormality seen.       CURRENT THERAPY:  monthly Faslodex injection started on 06/07/17   INTERVAL HISTORY:  Gloria [name] is here for a follow up. She presents to the clinic today by herself. She notes she is doing well. She notes worrying some with COVID-19 but notes she does not leave the house much. Her daughter does most of the shopping for her.  She notes a car door hit her left posterior calf over 1 month ago. This is healing well. She has a home health nurse who comes see her. She notes s a spot on a her back that itches constantly . She notes the closer she gets date of Faslodex injection the more it itches and after injection the itching subsides.   She notes when swallowing certain foods she feels heavy pressure when it goes down her chest. She notes  the food goes down. She notes this has been going on for 2 months.  I reviewed her medication list. She takes Xanax at night and in the morning.    REVIEW OF SYSTEMS:   Constitutional: Denies fevers, chills or abnormal weight loss Eyes: Denies blurriness of vision Ears, nose, mouth, throat, and face: Denies mucositis or sore throat (+) Chest pressure with swallowing certain foods  Respiratory: Denies cough, dyspnea or wheezes Cardiovascular: Denies palpitation, chest discomfort or lower extremity swelling Gastrointestinal:  Denies nausea, heartburn or change in bowel habits Skin: Denies abnormal skin rashes (+) wound of left post calf, healing (+) Mid back spot with itching.  Lymphatics: Denies new lymphadenopathy or easy bruising Neurological:Denies numbness, tingling or new weaknesses Behavioral/Psych: Mood is stable, no new changes  All other systems were reviewed with the patient and are negative.  MEDICAL HISTORY:  Past Medical History:  Diagnosis Date  . Anxiety   . Blood in urine   . CAD (coronary artery disease)    non obstructive by cath  .  Cancer [facility])    breast- left  . Depression   . GERD (gastroesophageal reflux disease)   . Hypertension   . Knee fracture   . Personal history of radiation therapy 2017  . TR (tricuspid regurgitation)    mild by Echo 12/2008 EF >55%    SURGICAL HISTORY: Past Surgical History:  Procedure Laterality Date  . ABDOMINAL AORTAGRAM N/A 09/08/2011   Procedure: ABDOMINAL Maxcine Ham;  Surgeon: Troy Sine, MD;  Location: [facility] CATH LAB;  Service: Cardiovascular;  Laterality: N/A;  . ABDOMINAL HYSTERECTOMY    . BREAST EXCISIONAL BIOPSY Left 2017   X3  . BREAST SURGERY  08-30-10   mastectomy  . CARDIAC CATHETERIZATION  08/2011   20% LAD stenosis, 20% diagonal stenosis, 10-20% proximal dominant RCA stenosis  . COLON SURGERY    . LEFT HEART CATHETERIZATION WITH CORONARY ANGIOGRAM N/A 09/08/2011   Procedure: LEFT HEART CATHETERIZATION WITH  CORONARY ANGIOGRAM;  Surgeon: Troy Sine, MD;  Location: [facility] CATH LAB;  Service: Cardiovascular;  Laterality: N/A;  . MASTECTOMY    . MINOR BREAST BIOPSY Left 11/25/2015   Procedure: MINOR EXCISION MASS LEFT CHEST WALL;  Surgeon: Autumn Messing III, MD;  Location: [place];  Service: General;  Laterality: Left;  . MINOR BREAST BIOPSY Left 07/17/2016   Procedure: EXCISION OF 2 LEFT CHEST WALL MASSES;  Surgeon: Autumn Messing III, MD;  Location: [place];  Service: General;  Laterality: Left;  . TONSILLECTOMY      I have reviewed the social history and family history with the patient and they are unchanged from previous note.  ALLERGIES:  is allergic to contrast media [iodinated diagnostic agents]; penicillins; adhesive [tape]; cephalexin; ciprofloxacin; demerol; sulfamethoxazole-trimethoprim; and quinolones.  MEDICATIONS:  Current Outpatient Medications  Medication Sig Dispense Refill  . acetaminophen (TYLENOL) 500 MG tablet Take 1,000 mg by mouth daily as needed for moderate pain. Reported on 03/10/2016    . ALPRAZolam (XANAX) 0.25 MG tablet Take 1/2 tablet by mouth in the morning and 1 tab at bedtime as needed 45 tablet 1  . aspirin 81 MG chewable tablet Chew 81 mg by mouth once a week. Reported on 03/10/2016    . CALCIUM-MAGNESIUM-VITAMIN D PO Take 1 tablet by mouth 2 (two) times daily.    . cetirizine (ZYRTEC) 10 MG tablet Take 10 mg by mouth as needed for allergies.     . diphenhydrAMINE (BENADRYL) 50 MG tablet Take 1 tablet (50 mg total) by mouth as directed. Take Benadryl 50 mg po 1 hour before CT scan. 1 tablet 0  . doxepin (SINEQUAN) 10 MG capsule Take 10 mg by mouth at bedtime.     . fluconazole (DIFLUCAN) 100 MG tablet Take 1 tablet (100 mg total) by mouth daily. 1 tablet 0  . fluticasone (FLONASE) 50 MCG/ACT nasal spray Place 1 spray into both nostrils daily as needed for allergies.     Marland Kitchen guaiFENesin (MUCINEX) 600 MG 12 hr tablet Take 600 mg by mouth daily  as needed for cough or to loosen phlegm. Reported on 03/28/2016    . lidocaine-prilocaine (EMLA) cream Apply 1 application topically 2 (two) times daily as needed. 30 g 1  . losartan (COZAAR) 25 MG tablet Take 25 mg by mouth daily.   0  . nitroGLYCERIN (NITROSTAT) 0.4 MG SL tablet Place 1 tablet (0.4 mg total) under the tongue every 5 (five) minutes as needed for chest pain. <PLEASE MAKE APPOINTMENT> 25 tablet 3  . potassium chloride (K-DUR) 10  MEQ tablet Take 10 mEq by mouth as needed.     . pseudoephedrine-acetaminophen (TYLENOL SINUS) 30-500 MG TABS tablet Take 1 tablet by mouth every 4 (four) hours as needed.    . triamcinolone (KENALOG) 0.025 % cream Apply 1 application topically 2 (two) times daily. 30 g 0   No current facility-administered medications for this visit.    Facility-Administered Medications Ordered in Other Visits  Medication Dose Route Frequency Provider Last Rate Last Dose  . fulvestrant (FASLODEX) injection 500 mg  500 mg Intramuscular Q30 days Truitt Merle, MD        PHYSICAL EXAMINATION: ECOG PERFORMANCE STATUS: 1 - Symptomatic but completely ambulatory  Vitals:   02/27/19 1005  BP: (!) 151/82  Pulse: 71  Resp: 17  Temp: 98 F (36.7 C)  SpO2: 99%   Filed Weights   02/27/19 1005  Weight: 132 lb 11.2 oz (60.2 kg)    GENERAL:alert, no distress and comfortable SKIN: skin color, texture, turgor are normal, no rashes or significant lesions (+) Mid back spot, flat (+) wound of left post calf, covered in bandage  EYES: normal, Conjunctiva are pink and non-injected, sclera clear OROPHARYNX:no exudate, no erythema and lips, buccal mucosa, and tongue normal  NECK: supple, thyroid normal size, non-tender, without nodularity LYMPH:  no palpable lymphadenopathy in the cervical, axillary or inguinal LUNGS: clear to auscultation and percussion with normal breathing effort HEART: regular rate & rhythm and no murmurs and no lower extremity edema ABDOMEN:abdomen soft,  non-tender and normal bowel sounds Musculoskeletal:no cyanosis of digits and no clubbing  NEURO: alert & oriented x 3 with fluent speech, no focal motor/sensory deficits BREAST: S/p left breast mastectomy: (+) no palpable mass or adenopathy  LABORATORY DATA:  I have reviewed the data as listed CBC Latest Ref Rng & Units 02/27/2019 12/05/2018 10/31/2018  WBC 4.0 - 10.5 K/uL 3.5(L) 3.6(L) 3.4(L)  Hemoglobin 12.0 - 15.0 g/dL 13.2 14.3 13.5  Hematocrit 36.0 - 46.0 % 41.3 44.3 42.4  Platelets 150 - 400 K/uL 136(L) 150 149(L)     CMP Latest Ref Rng & Units 02/27/2019 12/05/2018 10/31/2018  Glucose 70 - 99 mg/dL 80 124(H) 94  BUN 8 - 23 mg/dL _0 Creatinine 0.44 - 1.00 mg/dL 0.87 0.90 0.89  Sodium 135 - 145 mmol/L 143 143 143  Potassium 3.5 - 5.1 mmol/L 3.9 3.8 3.8  Chloride 98 - 111 mmol/L 106 105 108  CO2 22 - 32 mmol/L _1 Calcium 8.9 - 10.3 mg/dL 9.1 9.7 9.3  Total Protein 6.5 - 8.1 g/dL 6.6 6.8 6.6  Total Bilirubin 0.3 - 1.2 mg/dL 0.4 0.8 0.6  Alkaline Phos 38 - 126 U/L 92 101 91  AST 15 - 41 U/L _2 ALT 0 - 44 U/L _3 Tumor Marker: CA 27.29 11/22/17: 32.3 12/20/17: 25.9 01/17/18: 29.4 03/14/18: 31.9 04/11/18: 55.4 05/09/18: 57.3 07/04/18: 40.8 10/31/18: 39.0 12/05/18: 41.1 02/27/19: PENDING    RADIOGRAPHIC STUDIES: I have personally reviewed the radiological images as listed and agreed with the findings in the report. No results found.   ASSESSMENT & PLAN:  Gloria [name] is a 85 y.o. female with   1. Cancer of the central portion of left female breast, Stage IIB, T2 N1 invasive lobular carcinoma, grade 2, ER 95%, PR 97%, Ki-67 17%, HER-2/neu no amplification, diagnosed in 07/2010, local chest wall recurrence in 09/2015  - She was diagnosed in 07/2010 but had chest wall local recurrence  in 09/2015. She was on adjuvant tamoxifen for 4.5 years, stopped on her own and developed local recurrence 8 months after she stopped tamoxifen. -She has completed chest wall  radiation, however developed local recurrence again and underwent a second chest wall surgery for two soft tissue resection in 06/2016. Unfortunately the surgical margins were positive. I spoke with Dr. Marlou Starks, no more surgery will be offered, because complete surgical resection is unlikely. Patient is also not in favor of more extensive surgery. -I have started her on fulvestrant injection on 8/9/18due to clinical suspicion for disease progress. She has been tolerating Faslodex injections well overall, her left chest wall pain has nearly resolved. -Due to possible anaphylactic reaction, she will not receive CT contrast with scans.  -From a breast cancer standpoint she is clinically doing well, stable. She has a few small skin lesions which she will continue to f/u with dermatologist.  Exam showed few tiny residual nodule at the left mastectomy, smaller than before, there is no clinical concern for recurrence.  -Labs reviewed, CBC WNL except WBC 3.5, PLT 136, CMP WNL. Exam unremarkable today. Ca 27.29 still pending. Overall adequate to proceed with Faslodex injection today. We will continue fulvestrant injection monthly -She is scheduled for PET on 03/04/19, but due to COVID-19 she would like to postpone for 2 months.  -F/u in 2 months   2. HTN, CAD  -She'll continue follow-up with her primary care physician and cardiologist -I previously encouraged her to monitor her BP at home and if remains elevated she should see her PCP. She is on Losartan.  3. Osteoporosis  -Her last DEXA scan in 06/2012 showed osteoporosis  -I previously encouraged her to continue calcium and Vit D -I previously encouraged her to repeat DEXA at her PCP office   4. Left Chest Pain  -Secondary to the small tumor nodules on the chest wall. -Since she has been having continued pain, I did offer her Gabapentin; she declined this for now. I encouraged her to take Tylenol and Ibuprofen for further management. -Now resolved. She  does note chest pressure with swallowing certain foods. Will   5. Goal of care discussion  -We again discussed the incurable nature of her cancer, and the overall poor prognosis, especially if he/she does not have good response to chemotherapy or progress on chemo -The patient understands the goal of care is palliative. -she is full code for now    Plan  -Labs reviewed, will continue monthly fulvestrant injection today  -Injection monthly X3 -F/u in 2 months with PET a few days before (postpone from 03/04/19)    No problem-specific Assessment & Plan notes found for this encounter.   No orders of the defined types were placed in this encounter.  All questions were answered. The patient knows to call the clinic with any problems, questions or concerns. No barriers to learning was detected. I spent 20 minutes counseling the patient face to face. The total time spent in the appointment was 25 minutes and more than 50% was on counseling and review of test results     Truitt Merle, MD 02/27/2019   I, Joslyn Devon, am acting as scribe for Truitt Merle, MD.   I have reviewed the above documentation for accuracy and completeness, and I agree with the above.

## 2019-02-27 ENCOUNTER — Telehealth: Payer: Self-pay

## 2019-02-27 ENCOUNTER — Telehealth: Payer: Self-pay | Admitting: Hematology

## 2019-02-27 ENCOUNTER — Other Ambulatory Visit: Payer: Self-pay

## 2019-02-27 ENCOUNTER — Inpatient Hospital Stay (HOSPITAL_BASED_OUTPATIENT_CLINIC_OR_DEPARTMENT_OTHER): Payer: PPO | Admitting: Hematology

## 2019-02-27 ENCOUNTER — Encounter: Payer: Self-pay | Admitting: Hematology

## 2019-02-27 ENCOUNTER — Inpatient Hospital Stay: Payer: PPO

## 2019-02-27 VITALS — BP 151/82 | HR 71 | Temp 98.0°F | Resp 17 | Ht 60.0 in | Wt 132.7 lb

## 2019-02-27 DIAGNOSIS — Z79811 Long term (current) use of aromatase inhibitors: Secondary | ICD-10-CM

## 2019-02-27 DIAGNOSIS — Z170 Estrogen receptor positive status [ER+]: Principal | ICD-10-CM

## 2019-02-27 DIAGNOSIS — I2510 Atherosclerotic heart disease of native coronary artery without angina pectoris: Secondary | ICD-10-CM

## 2019-02-27 DIAGNOSIS — F419 Anxiety disorder, unspecified: Secondary | ICD-10-CM | POA: Diagnosis not present

## 2019-02-27 DIAGNOSIS — M810 Age-related osteoporosis without current pathological fracture: Secondary | ICD-10-CM

## 2019-02-27 DIAGNOSIS — Z7982 Long term (current) use of aspirin: Secondary | ICD-10-CM

## 2019-02-27 DIAGNOSIS — M5136 Other intervertebral disc degeneration, lumbar region: Secondary | ICD-10-CM

## 2019-02-27 DIAGNOSIS — Z79818 Long term (current) use of other agents affecting estrogen receptors and estrogen levels: Secondary | ICD-10-CM

## 2019-02-27 DIAGNOSIS — I10 Essential (primary) hypertension: Secondary | ICD-10-CM

## 2019-02-27 DIAGNOSIS — C50112 Malignant neoplasm of central portion of left female breast: Secondary | ICD-10-CM | POA: Diagnosis not present

## 2019-02-27 DIAGNOSIS — Z923 Personal history of irradiation: Secondary | ICD-10-CM

## 2019-02-27 DIAGNOSIS — K219 Gastro-esophageal reflux disease without esophagitis: Secondary | ICD-10-CM | POA: Diagnosis not present

## 2019-02-27 DIAGNOSIS — R1310 Dysphagia, unspecified: Secondary | ICD-10-CM | POA: Diagnosis not present

## 2019-02-27 DIAGNOSIS — Z79899 Other long term (current) drug therapy: Secondary | ICD-10-CM

## 2019-02-27 DIAGNOSIS — R079 Chest pain, unspecified: Secondary | ICD-10-CM | POA: Diagnosis not present

## 2019-02-27 DIAGNOSIS — Z8719 Personal history of other diseases of the digestive system: Secondary | ICD-10-CM

## 2019-02-27 DIAGNOSIS — I700 Atherosclerosis of aorta: Secondary | ICD-10-CM

## 2019-02-27 DIAGNOSIS — Z9012 Acquired absence of left breast and nipple: Secondary | ICD-10-CM

## 2019-02-27 LAB — CBC WITH DIFFERENTIAL/PLATELET
Abs Immature Granulocytes: 0.01 10*3/uL (ref 0.00–0.07)
Basophils Absolute: 0.1 10*3/uL (ref 0.0–0.1)
Basophils Relative: 2 %
Eosinophils Absolute: 0.4 10*3/uL (ref 0.0–0.5)
Eosinophils Relative: 11 %
HCT: 41.3 % (ref 36.0–46.0)
Hemoglobin: 13.2 g/dL (ref 12.0–15.0)
Immature Granulocytes: 0 %
Lymphocytes Relative: 19 %
Lymphs Abs: 0.7 10*3/uL (ref 0.7–4.0)
MCH: 30.6 pg (ref 26.0–34.0)
MCHC: 32 g/dL (ref 30.0–36.0)
MCV: 95.8 fL (ref 80.0–100.0)
Monocytes Absolute: 0.4 10*3/uL (ref 0.1–1.0)
Monocytes Relative: 11 %
Neutro Abs: 2 10*3/uL (ref 1.7–7.7)
Neutrophils Relative %: 57 %
Platelets: 136 10*3/uL — ABNORMAL LOW (ref 150–400)
RBC: 4.31 MIL/uL (ref 3.87–5.11)
RDW: 12.7 % (ref 11.5–15.5)
WBC: 3.5 10*3/uL — ABNORMAL LOW (ref 4.0–10.5)
nRBC: 0 % (ref 0.0–0.2)

## 2019-02-27 LAB — COMPREHENSIVE METABOLIC PANEL
ALT: 13 U/L (ref 0–44)
AST: 22 U/L (ref 15–41)
Albumin: 3.6 g/dL (ref 3.5–5.0)
Alkaline Phosphatase: 92 U/L (ref 38–126)
Anion gap: 10 (ref 5–15)
BUN: 17 mg/dL (ref 8–23)
CO2: 27 mmol/L (ref 22–32)
Calcium: 9.1 mg/dL (ref 8.9–10.3)
Chloride: 106 mmol/L (ref 98–111)
Creatinine, Ser: 0.87 mg/dL (ref 0.44–1.00)
GFR calc Af Amer: >60 mL/min (ref >60)
GFR calc non Af Amer: 59 mL/min — ABNORMAL LOW (ref >60)
Glucose, Bld: 80 mg/dL (ref 70–99)
Potassium: 3.9 mmol/L (ref 3.5–5.1)
Sodium: 143 mmol/L (ref 135–145)
Total Bilirubin: 0.4 mg/dL (ref 0.3–1.2)
Total Protein: 6.6 g/dL (ref 6.5–8.1)

## 2019-02-27 MED ORDER — FULVESTRANT 250 MG/5ML IM SOLN
500.0000 mg | INTRAMUSCULAR | Status: DC
  Administered 2019-02-27: 500 mg via INTRAMUSCULAR

## 2019-02-27 MED ORDER — ALPRAZOLAM 0.25 MG PO TABS: ORAL_TABLET | ORAL | 1 refills | Status: DC

## 2019-02-27 MED ORDER — FULVESTRANT 250 MG/5ML IM SOLN
INTRAMUSCULAR | Status: AC
  Filled 2019-02-27: qty 10

## 2019-02-27 NOTE — Telephone Encounter (Signed)
Spoke with Tasha in [employer] informed her patient does not want to have PET scan on 5/5, would like to reschedule for one month out, she will take care of this and call the patient.

## 2019-02-27 NOTE — Patient Instructions (Signed)

## 2019-02-27 NOTE — Telephone Encounter (Signed)
Scheduled appt per 4/30 los. ° °A calendar will be mailed out. °

## 2019-02-28 LAB — CANCER ANTIGEN 27.29: CA 27.29: 49.2 U/mL — ABNORMAL HIGH (ref 0.0–38.6)

## 2019-03-04 ENCOUNTER — Encounter (HOSPITAL_COMMUNITY): Payer: PPO

## 2019-03-04 DIAGNOSIS — I10 Essential (primary) hypertension: Secondary | ICD-10-CM | POA: Diagnosis not present

## 2019-03-04 DIAGNOSIS — K760 Fatty (change of) liver, not elsewhere classified: Secondary | ICD-10-CM | POA: Diagnosis not present

## 2019-03-04 DIAGNOSIS — S81812A Laceration without foreign body, left lower leg, initial encounter: Secondary | ICD-10-CM | POA: Diagnosis not present

## 2019-03-04 DIAGNOSIS — Z9181 History of falling: Secondary | ICD-10-CM | POA: Diagnosis not present

## 2019-03-04 DIAGNOSIS — Z7982 Long term (current) use of aspirin: Secondary | ICD-10-CM | POA: Diagnosis not present

## 2019-03-04 DIAGNOSIS — K5790 Diverticulosis of intestine, part unspecified, without perforation or abscess without bleeding: Secondary | ICD-10-CM | POA: Diagnosis not present

## 2019-03-04 DIAGNOSIS — D696 Thrombocytopenia, unspecified: Secondary | ICD-10-CM | POA: Diagnosis not present

## 2019-03-04 DIAGNOSIS — H3530 Unspecified macular degeneration: Secondary | ICD-10-CM | POA: Diagnosis not present

## 2019-03-04 DIAGNOSIS — Z853 Personal history of malignant neoplasm of breast: Secondary | ICD-10-CM | POA: Diagnosis not present

## 2019-03-04 DIAGNOSIS — J449 Chronic obstructive pulmonary disease, unspecified: Secondary | ICD-10-CM | POA: Diagnosis not present

## 2019-03-04 DIAGNOSIS — M810 Age-related osteoporosis without current pathological fracture: Secondary | ICD-10-CM | POA: Diagnosis not present

## 2019-03-06 IMAGING — US US ABDOMEN COMPLETE
1 series · 13 of 25 positions shown · non-contrast
Comparison: CT scan of September 19, 2017.

CLINICAL DATA: Chronic left upper quadrant abdominal pain.

EXAM:
ABDOMEN ULTRASOUND COMPLETE

[Series 1: us abdomen complete · 0.17mm/px · 13 of 99 slices shown]
[im 1/99]
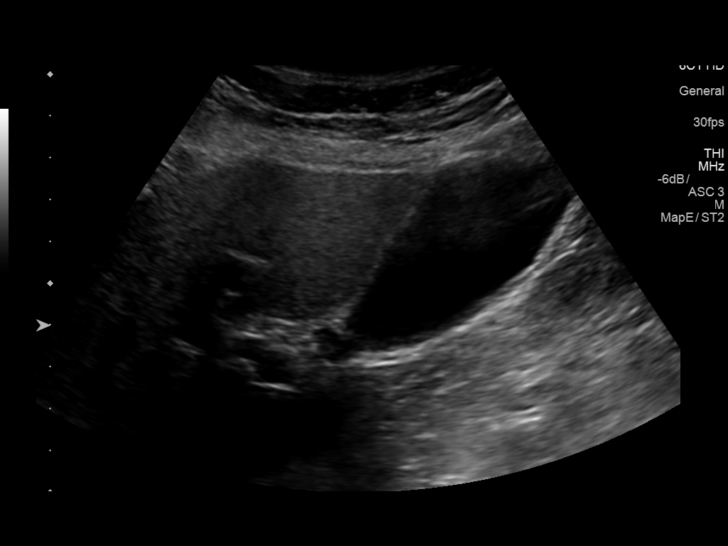
[im 9/99]
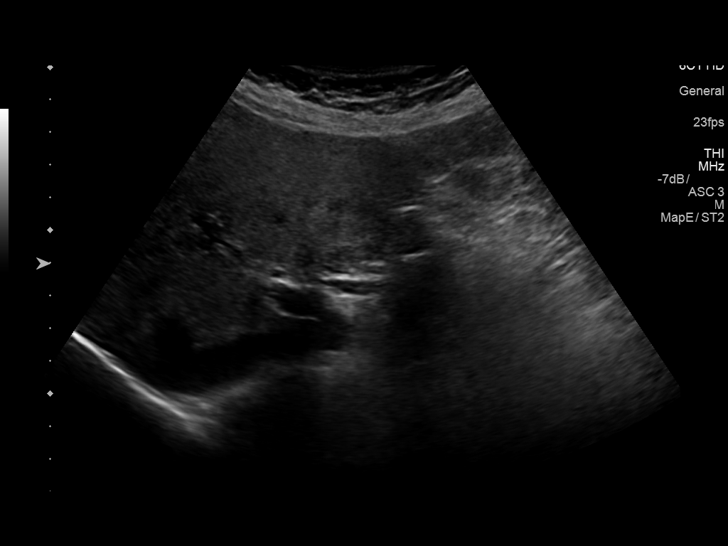
[im 17/99]
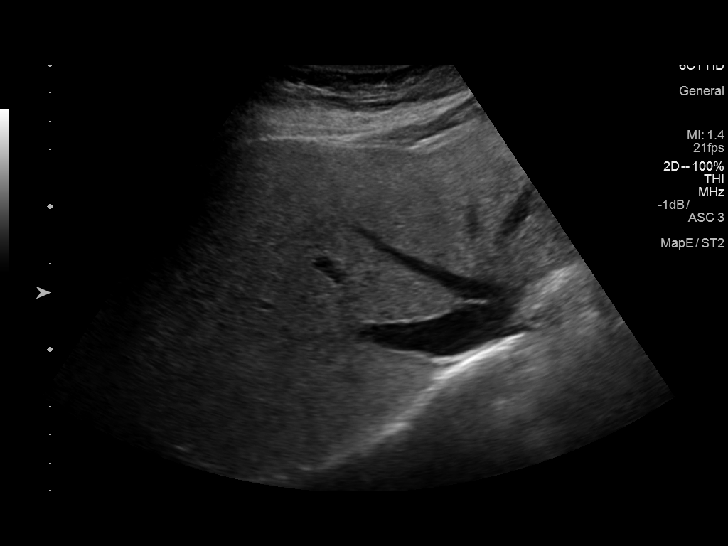
[im 25/99]
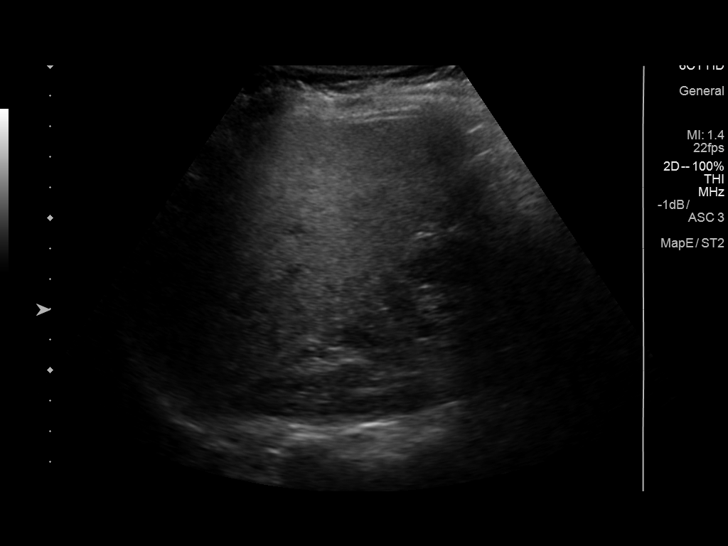
[im 33/99]
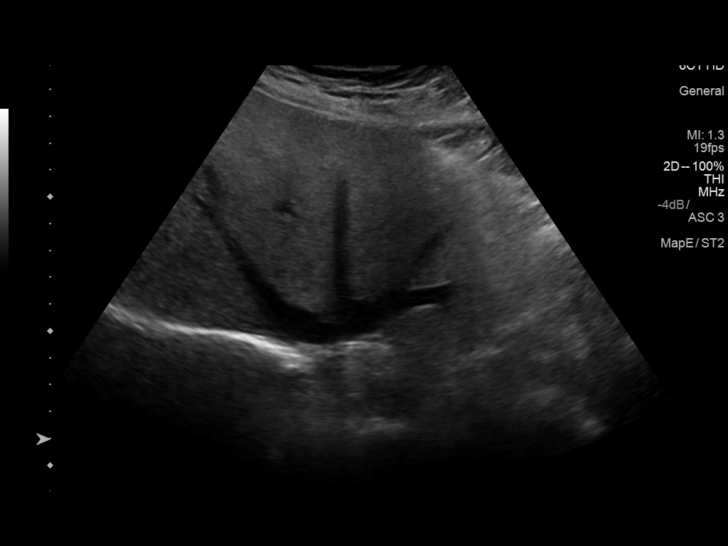
[im 41/99]
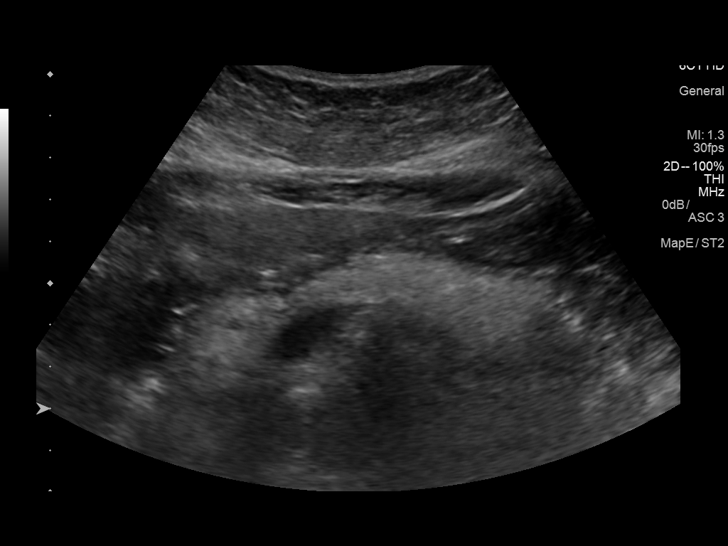
[im 50/99]
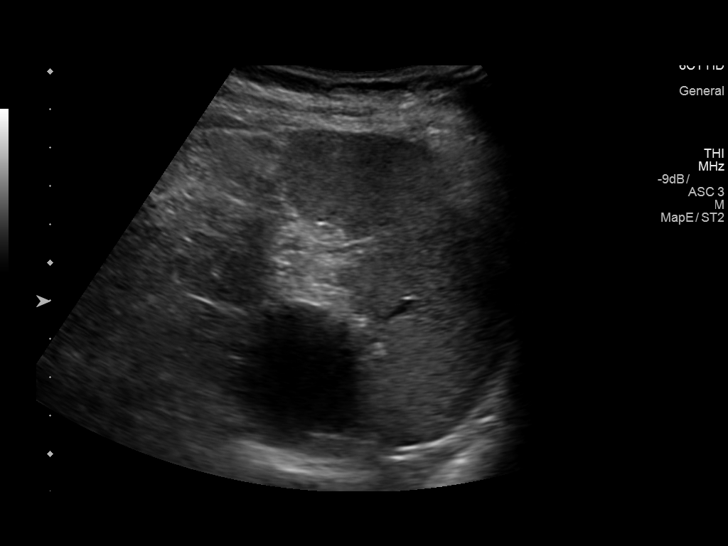
[im 58/99]
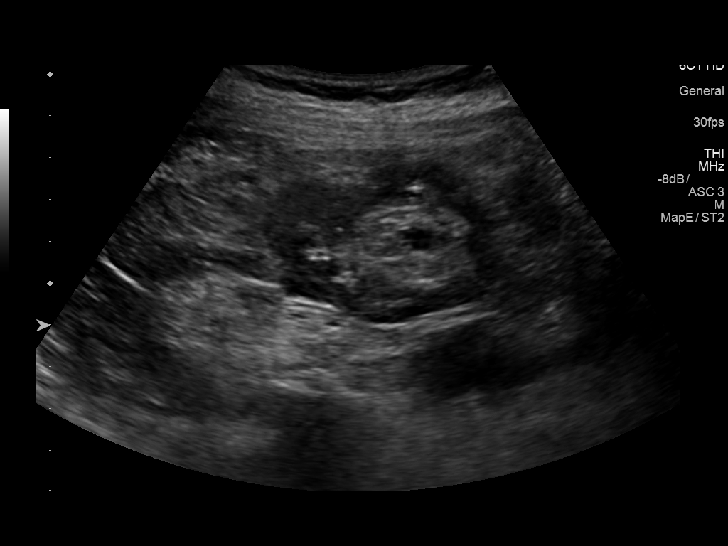
[im 66/99]
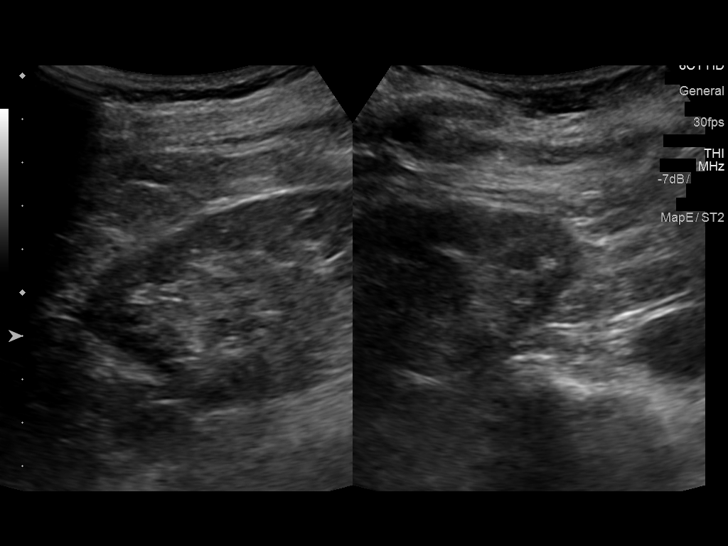
[im 74/99]
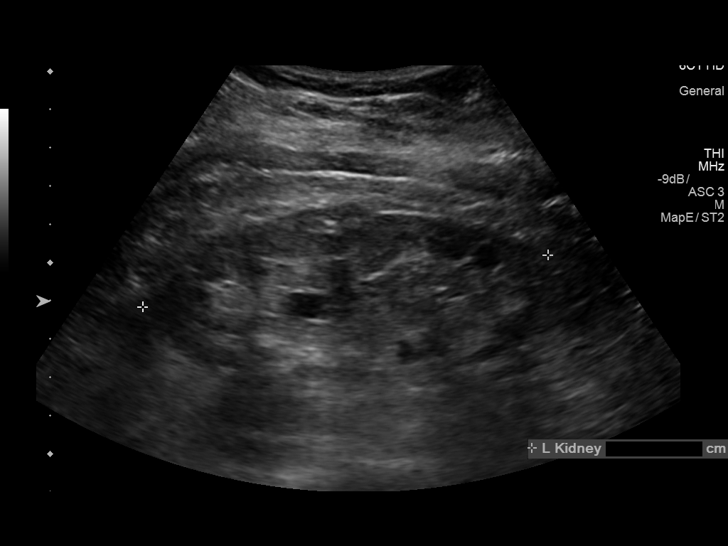
[im 82/99]
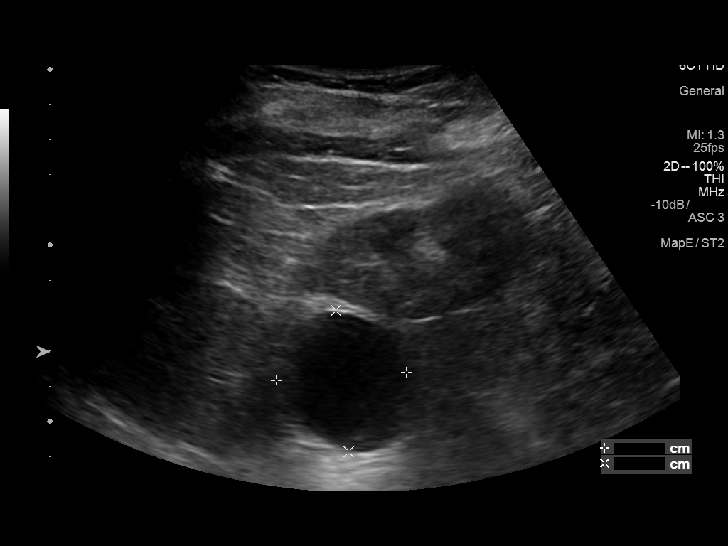
[im 90/99]
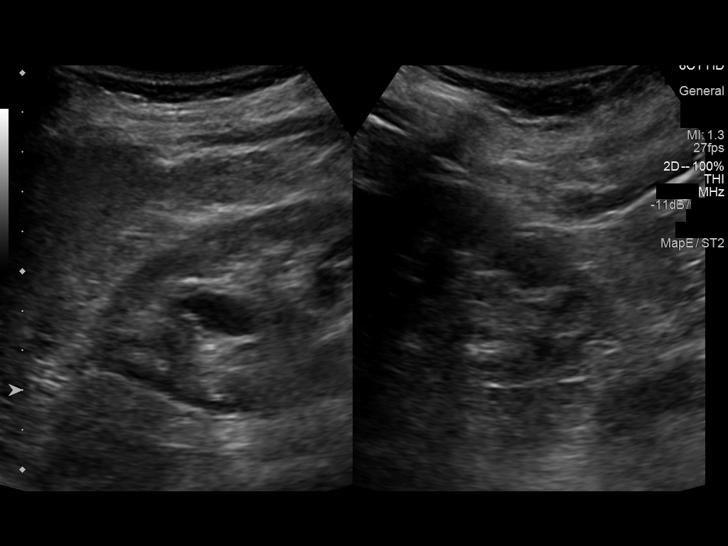
[im 99/99]
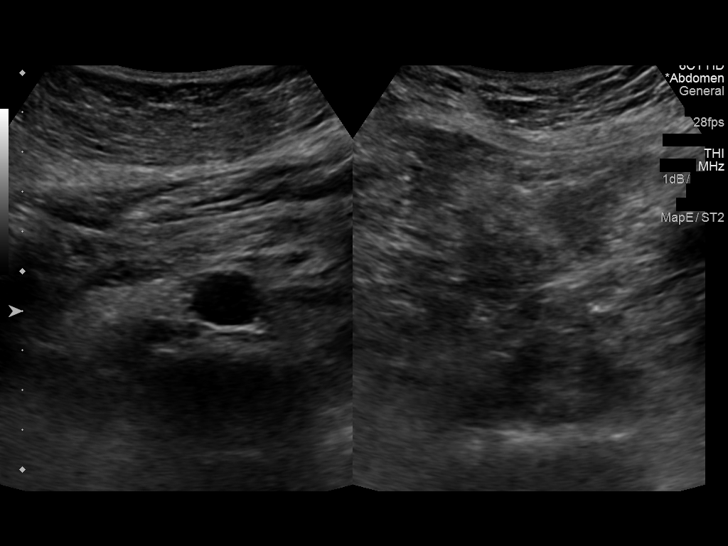

[13 of 25 positions shown; findings below may reference images not displayed]

FINDINGS: Gallbladder: No gallstones or wall thickening visualized. No
sonographic Murphy sign noted by sonographer.

Common bile duct: Diameter: 4 mm which is within normal limits.

Liver: No focal lesion identified. Increased echogenicity of hepatic
parenchyma concerning for fatty infiltration. Portal vein is patent
on color Doppler imaging with normal direction of blood flow towards
the liver.

IVC: No abnormality visualized.

Pancreas: Visualized portion unremarkable.

Spleen: Size and appearance within normal limits.

Right Kidney: Length: 10.2 cm. Parapelvic cysts are noted.
Echogenicity within normal limits. No mass or hydronephrosis
visualized.

Left Kidney: Length: 10.7 cm. Two simple cysts are noted, with the
largest measuring 4.4 cm in upper pole. Echogenicity within normal
limits. No mass or hydronephrosis visualized.

Abdominal aorta: No aneurysm visualized.

Other findings: None.
IMPRESSION: Increased echogenicity of hepatic parenchyma is noted concerning for
fatty infiltration. Bilateral simple renal cysts are noted. No other
significant abnormality seen in the abdomen.

## 2019-03-12 DIAGNOSIS — M1711 Unilateral primary osteoarthritis, right knee: Secondary | ICD-10-CM | POA: Diagnosis not present

## 2019-03-12 DIAGNOSIS — M1712 Unilateral primary osteoarthritis, left knee: Secondary | ICD-10-CM | POA: Diagnosis not present

## 2019-03-12 DIAGNOSIS — M25561 Pain in right knee: Secondary | ICD-10-CM | POA: Diagnosis not present

## 2019-03-18 DIAGNOSIS — Z7982 Long term (current) use of aspirin: Secondary | ICD-10-CM | POA: Diagnosis not present

## 2019-03-18 DIAGNOSIS — H3530 Unspecified macular degeneration: Secondary | ICD-10-CM | POA: Diagnosis not present

## 2019-03-18 DIAGNOSIS — D696 Thrombocytopenia, unspecified: Secondary | ICD-10-CM | POA: Diagnosis not present

## 2019-03-18 DIAGNOSIS — K760 Fatty (change of) liver, not elsewhere classified: Secondary | ICD-10-CM | POA: Diagnosis not present

## 2019-03-18 DIAGNOSIS — Z9181 History of falling: Secondary | ICD-10-CM | POA: Diagnosis not present

## 2019-03-18 DIAGNOSIS — S81812A Laceration without foreign body, left lower leg, initial encounter: Secondary | ICD-10-CM | POA: Diagnosis not present

## 2019-03-18 DIAGNOSIS — M810 Age-related osteoporosis without current pathological fracture: Secondary | ICD-10-CM | POA: Diagnosis not present

## 2019-03-18 DIAGNOSIS — I10 Essential (primary) hypertension: Secondary | ICD-10-CM | POA: Diagnosis not present

## 2019-03-18 DIAGNOSIS — K5790 Diverticulosis of intestine, part unspecified, without perforation or abscess without bleeding: Secondary | ICD-10-CM | POA: Diagnosis not present

## 2019-03-18 DIAGNOSIS — Z853 Personal history of malignant neoplasm of breast: Secondary | ICD-10-CM | POA: Diagnosis not present

## 2019-03-18 DIAGNOSIS — J449 Chronic obstructive pulmonary disease, unspecified: Secondary | ICD-10-CM | POA: Diagnosis not present

## 2019-03-28 ENCOUNTER — Inpatient Hospital Stay: Payer: PPO | Attending: Hematology

## 2019-03-28 ENCOUNTER — Other Ambulatory Visit: Payer: Self-pay

## 2019-03-28 DIAGNOSIS — Z79818 Long term (current) use of other agents affecting estrogen receptors and estrogen levels: Secondary | ICD-10-CM | POA: Insufficient documentation

## 2019-03-28 DIAGNOSIS — Z170 Estrogen receptor positive status [ER+]: Secondary | ICD-10-CM | POA: Diagnosis not present

## 2019-03-28 DIAGNOSIS — C50112 Malignant neoplasm of central portion of left female breast: Secondary | ICD-10-CM | POA: Diagnosis not present

## 2019-03-28 MED ORDER — FULVESTRANT 250 MG/5ML IM SOLN
INTRAMUSCULAR | Status: AC
  Filled 2019-03-28: qty 10

## 2019-03-28 MED ORDER — FULVESTRANT 250 MG/5ML IM SOLN
500.0000 mg | INTRAMUSCULAR | Status: DC
  Administered 2019-03-28: 500 mg via INTRAMUSCULAR

## 2019-03-28 NOTE — Patient Instructions (Signed)

## 2019-04-03 NOTE — Progress Notes (Addendum)
[place]   [phone] Fax:([phone]) [phone]   Clinic Follow up Note   Patient Care Team: Lajean Manes, MD as PCP - [place], [place], MD as Consulting Physician (General Surgery) Truitt Merle, MD as Consulting Physician (Hematology) [name], MD as Consulting Physician (Radiation Oncology) Sylvan Cheese, NP as Nurse Practitioner (Hematology and Oncology) Karen Kays, NP as Nurse Practitioner (Nurse Practitioner)  Date of Service:  04/07/2019   I connected with [name] on 04/07/2019  at 11:15am EST by telephone visit and verified that I am speaking with the correct person using two identifiers.  I discussed the limitations, risks, security and privacy concerns of performing an evaluation and management service by telephone and the availability of in person appointments. I also discussed with the patient that there may be a patient responsible charge related to this service. The patient expressed understanding and agreed to proceed.   Patient's location:  Her home  Provider's location:  My office   CHIEF COMPLAINT: F/uinvasive lobular carcinoma of the left breast  SUMMARY OF ONCOLOGIC HISTORY: Oncology History   Cancer of central portion of left female breast [facility])   Staging form: Breast, AJCC 7th Edition     Pathologic stage from 08/04/2010: Stage IIB (T2, N1a, cM0) - Signed by Truitt Merle, MD on 12/09/2015       Cancer of central portion of left female breast ([place])   08/03/2010 Mammogram     left nipple retraction, and the palpable mass subareolar left breast at the 6:00 position. Ultrasound confirmed the presence of a mass measuring 2.6 x 1.7 x 2.4 cm    08/04/2010 Initial Biopsy    left breast mass biopsy showed an invasive mammary carcinoma with lobular features. ER 95%, PR 97%, Ki-67 17%, HER-2/neu (-)    08/04/2010 Pathologic Stage    Stage IIB: T2 N1a    08/11/2010 Imaging    left breast mass 4.6 x 4.2 x 2.3 cm  with enhancement distortion extending to the nipple retraction. There is questionable anterior mediastinal lymph node seen    07/2010 - 09/03/2010 Anti-estrogen oral therapy    neoadjuvant letrozole     09/03/2010 Surgery     left breast mastectomy with sentinel node biopsy on 09/03/2010.    08/2010 - 01/2015 Anti-estrogen oral therapy    Tamoxifen, pt stopped on her own     12/03/2010 - 01/23/2011 Radiation Therapy    left chest wall and axilla radiaiton     10/19/2015 Progression    Left chest wall recurrence, s/p resection on 11/25/2015 with positive margins ([facility])     12/10/2015 - 05/31/2017 Anti-estrogen oral therapy    anastrozole 33m daily, which was switched to Tamoxifen on 06/30/2016 due to persistent disease.    01/03/2016 - 03/03/2016 Radiation Therapy    Radiation to left chest wall recurrence (12/06/2015-02/04/2016 - 44 Gy), pt progressed through the radiation, and had additional 10 fractions of radiation (02/21/16-03/03/2016 - total 70 Gy to the left chest wall)    07/14/2016 PET scan    PET 07/14/2016 IMPRESSION: 1. No findings of recurrent malignancy. 2. Radiation fibrosis in the lingula. A small left axillary lymph node is not hypermetabolic and only 6 mm in diameter. This is in the vicinity of the prior axillary cyst. 3. Pelvic floor laxity.    07/17/2016 Surgery    Chest wall soft tissue resection for local recurrence    07/17/2016 Pathology Results    Left chest wall two soft  tissue resection both showed invasive lobular carcinoma, margins were positive. ER 95% positive, PR 60% positive, HER-2 negative.    07/27/2016 Imaging    CT chest, abdomen and pelvis wo contrast  07/27/2016 IMPRESSION: Sigmoid diverticulosis without acute diverticulitis. No bowel obstruction or acute inflammation. Degenerative disc disease and facet arthropathy of the lower lumbar spine with slight grade 1 anterolisthesis of L4 on L5. Stable appearing simple left-sided renal cysts.    03/01/2017 Mammogram     Mammogram 03/01/2017 IMPRESSION: No mammographic evidence of malignancy. A result letter of this screening mammogram will be mailed directly to the patient. RECOMMENDATION: Screening mammogram in one year    04/10/2017 Imaging    PET IMPRESSION: 1. Stable exam.  No findings of recurrent malignancy. 2. Changes of external beam radiation again noted within the lingula. 3. Small left axillary lymph node exhibits mild increased uptake. Unchanged from previous exam.    06/07/2017 -  Anti-estrogen oral therapy    Stopped Tamoxifen and Began Faslodex IM injection on 06/07/17     09/19/2017 Imaging    CT CAP WO Contrast 09/19/17 IMPRESSION: 1. Surgical changes from a left mastectomy without CT findings to suggest recurrent chest wall tumor or axillary lymphadenopathy. 2. No findings to suggest metastatic disease involving the neck, chest, abdomen or pelvis or osseous structures. 3. Stable radiation changes involving the lingula.     09/19/2017 Imaging    Bone Scan Whole Body  IMPRESSION: No definite scintigraphic evidence of osseous metastatic disease. Scattered degenerative type uptake as above.    03/11/2018 PET scan    IMPRESSION:  1. No evidence of recurrent disease. 2.  Aortic atherosclerosis     09/03/2018 PET scan    09/03/2018 PET Scan IMPRESSION: 1. No findings to suggest locally recurrent disease or metastatic disease in the neck, chest, abdomen or pelvis. 2. Hepatic steatosis. 3. Colonic diverticulosis without evidence of acute diverticulitis at this time. 4. Aortic atherosclerosis. 5. Additional incidental findings, as above.    11/25/2018 Imaging    DG Ribs Unilateral W/Chest Left  IMPRESSION: Stable left lingular scarring. Normal left ribs. No acute cardiopulmonary abnormality seen.     04/04/2019 PET scan    PET  IMPRESSION: 1. No evidence of hypermetabolic recurrent or metastatic disease. 2. Hepatic steatosis. 3.  Aortic Atherosclerosis (ICD10-I70.0).       CURRENT THERAPY:  monthly Faslodex injection started on 06/07/17   INTERVAL HISTORY:  Gloria [name] is here for a follow up. She presents to the clinic alone. She notes she is at her PA to her nephrologist due to recent bladder infection and sinus issues. She notes she otherwise feels well. She notes her 02/2019 injection was painful and feels like the needle broke.     REVIEW OF SYSTEMS:   Constitutional: Denies fevers, chills or abnormal weight loss Eyes: Denies blurriness of vision Ears, nose, mouth, throat, and face: Denies mucositis or sore throat (+) sinus infection symptoms Respiratory: Denies cough, dyspnea or wheezes Cardiovascular: Denies palpitation, chest discomfort or lower extremity swelling Gastrointestinal:  Denies nausea, heartburn or change in bowel habits Skin: Denies abnormal skin rashes UA: (+) bladder infection Lymphatics: Denies new lymphadenopathy or easy bruising Neurological:Denies numbness, tingling or new weaknesses Behavioral/Psych: Mood is stable, no new changes  All other systems were reviewed with the patient and are negative.  MEDICAL HISTORY:  Past Medical History:  Diagnosis Date   Anxiety    Blood in urine    CAD (coronary artery disease)    non  obstructive by cath   Cancer [facility])    breast- left   Depression    GERD (gastroesophageal reflux disease)    Hypertension    Knee fracture    Personal history of radiation therapy 2017   TR (tricuspid regurgitation)    mild by Echo 12/2008 EF >55%    SURGICAL HISTORY: Past Surgical History:  Procedure Laterality Date   ABDOMINAL AORTAGRAM N/A 09/08/2011   Procedure: ABDOMINAL Maxcine Ham;  Surgeon: Troy Sine, MD;  Location: [facility] CATH LAB;  Service: Cardiovascular;  Laterality: N/A;   ABDOMINAL HYSTERECTOMY     BREAST EXCISIONAL BIOPSY Left 2017   X3   BREAST SURGERY  08-30-10   mastectomy   CARDIAC CATHETERIZATION  08/2011   20% LAD stenosis, 20% diagonal stenosis, 10-20%  proximal dominant RCA stenosis   COLON SURGERY     LEFT HEART CATHETERIZATION WITH CORONARY ANGIOGRAM N/A 09/08/2011   Procedure: LEFT HEART CATHETERIZATION WITH CORONARY ANGIOGRAM;  Surgeon: Troy Sine, MD;  Location: [facility] CATH LAB;  Service: Cardiovascular;  Laterality: N/A;   MASTECTOMY     MINOR BREAST BIOPSY Left 11/25/2015   Procedure: MINOR EXCISION MASS LEFT CHEST WALL;  Surgeon: Autumn Messing III, MD;  Location: [place];  Service: General;  Laterality: Left;   MINOR BREAST BIOPSY Left 07/17/2016   Procedure: EXCISION OF 2 LEFT CHEST WALL MASSES;  Surgeon: Autumn Messing III, MD;  Location: [place];  Service: General;  Laterality: Left;   TONSILLECTOMY      I have reviewed the social history and family history with the patient and they are unchanged from previous note.  ALLERGIES:  is allergic to contrast media [iodinated diagnostic agents]; penicillins; adhesive [tape]; cephalexin; ciprofloxacin; demerol; sulfamethoxazole-trimethoprim; and quinolones.  MEDICATIONS:  Current Outpatient Medications  Medication Sig Dispense Refill   acetaminophen (TYLENOL) 500 MG tablet Take 1,000 mg by mouth daily as needed for moderate pain. Reported on 03/10/2016     ALPRAZolam (XANAX) 0.25 MG tablet Take 1/2 tablet by mouth in the morning and 1 tab at bedtime as needed 45 tablet 1   aspirin 81 MG chewable tablet Chew 81 mg by mouth once a week. Reported on 03/10/2016     CALCIUM-MAGNESIUM-VITAMIN D PO Take 1 tablet by mouth 2 (two) times daily.     cetirizine (ZYRTEC) 10 MG tablet Take 10 mg by mouth as needed for allergies.      diphenhydrAMINE (BENADRYL) 50 MG tablet Take 1 tablet (50 mg total) by mouth as directed. Take Benadryl 50 mg po 1 hour before CT scan. 1 tablet 0   doxepin (SINEQUAN) 10 MG capsule Take 10 mg by mouth at bedtime.      fluconazole (DIFLUCAN) 100 MG tablet Take 1 tablet (100 mg total) by mouth daily. 1 tablet 0   fluticasone  (FLONASE) 50 MCG/ACT nasal spray Place 1 spray into both nostrils daily as needed for allergies.      guaiFENesin (MUCINEX) 600 MG 12 hr tablet Take 600 mg by mouth daily as needed for cough or to loosen phlegm. Reported on 03/28/2016     lidocaine-prilocaine (EMLA) cream Apply 1 application topically 2 (two) times daily as needed. 30 g 1   losartan (COZAAR) 25 MG tablet Take 25 mg by mouth daily.   0   nitroGLYCERIN (NITROSTAT) 0.4 MG SL tablet Place 1 tablet (0.4 mg total) under the tongue every 5 (five) minutes as needed for chest pain. <PLEASE MAKE APPOINTMENT> 25 tablet 3  potassium chloride (K-DUR) 10 MEQ tablet Take 10 mEq by mouth as needed.      pseudoephedrine-acetaminophen (TYLENOL SINUS) 30-500 MG TABS tablet Take 1 tablet by mouth every 4 (four) hours as needed.     triamcinolone (KENALOG) 0.025 % cream Apply 1 application topically 2 (two) times daily. 30 g 0   No current facility-administered medications for this visit.     PHYSICAL EXAMINATION: ECOG PERFORMANCE STATUS: 1 - Symptomatic but completely ambulatory  No vitals taken today, Exam not performed today  LABORATORY DATA:  I have reviewed the data as listed CBC Latest Ref Rng & Units 02/27/2019 12/05/2018 10/31/2018  WBC 4.0 - 10.5 K/uL 3.5(L) 3.6(L) 3.4(L)  Hemoglobin 12.0 - 15.0 g/dL 13.2 14.3 13.5  Hematocrit 36.0 - 46.0 % 41.3 44.3 42.4  Platelets 150 - 400 K/uL 136(L) 150 149(L)     CMP Latest Ref Rng & Units 02/27/2019 12/05/2018 10/31/2018  Glucose 70 - 99 mg/dL 80 124(H) 94  BUN 8 - 23 mg/dL 17 16 12   Creatinine 0.44 - 1.00 mg/dL 0.87 0.90 0.89  Sodium 135 - 145 mmol/L 143 143 143  Potassium 3.5 - 5.1 mmol/L 3.9 3.8 3.8  Chloride 98 - 111 mmol/L 106 105 108  CO2 22 - 32 mmol/L 27 29 28   Calcium 8.9 - 10.3 mg/dL 9.1 9.7 9.3  Total Protein 6.5 - 8.1 g/dL 6.6 6.8 6.6  Total Bilirubin 0.3 - 1.2 mg/dL 0.4 0.8 0.6  Alkaline Phos 38 - 126 U/L 92 101 91  AST 15 - 41 U/L 22 19 22   ALT 0 - 44 U/L 13 14 17    CA  27.29 11/22/17: 32.3 12/20/17: 25.9 01/17/18: 29.4 03/14/18: 31.9 04/11/18: 55.4 05/09/18: 57.3 07/04/18: 40.8 10/31/18: 39.0 12/05/18: 41.1 02/27/19: 49.2   RADIOGRAPHIC STUDIES: I have personally reviewed the radiological images as listed and agreed with the findings in the report. No results found.   ASSESSMENT & PLAN:  Gloria [name] is a 85 y.o. female with   1. Cancer of the central portion of left female breast, Stage IIB, T2 N1 invasive lobular carcinoma, grade 2, ER 95%, PR 97%, Ki-67 17%, HER-2/neu no amplification, diagnosed in 07/2010, local chest wall recurrence in 09/2015  -She wasdiagnosed in 10/2011but hadchest wall local recurrence in 09/2015. She was on adjuvant tamoxifen for 4.5 years, stopped on her own anddeveloped local recurrence 8 months after she stopped tamoxifen. -She has completed chest wall radiation, however developedlocal recurrence again andunderwent a second chest wall surgery for two soft tissue resection in 06/2016. Unfortunately the surgicalmargins were positive. I spoke with Dr. Marlou Starks, no more surgery will be offered, because complete surgical resection is unlikely. Patient is also not in favor of more extensive surgery. -I have started her on fulvestrant injection on 8/9/18due to clinical suspicion for disease progress. She has been tolerating Faslodex injections well overall, her left chest wall pain has nearly resolved. -Due to possible anaphylactic reaction, she will not receive CT contrast with scans.  -We discussed her PET from 04/04/19 which shows no evidence of recurrence or metastatic disease.  -Her next fulvestrant injection is 6/29, she complains severe pain from injection last time, will see her on 6/29  2. HTN, CAD  -She'll continue follow-up with her primary care physician and cardiologist -I previously encouraged her to monitor her BP at home and if remains elevated she should see her PCP. She is on Losartan.  3. Osteoporosis  -Her last  DEXA scan in 06/2012 showed osteoporosis  -  I previously encouraged her to continue calcium and Vit D -I previously encouraged her to repeat DEXA at her PCP office   4. Left Chest Pain  -Secondary to the small tumor nodules on the chest wall. -Since she has been having continued pain, I did offer her Gabapentin; she declined this for now. I encouraged her to take Tylenol and Ibuprofen for further management. -Now resolved. She previously noted chest pressure with swallowing certain foods.   5. Goal of care discussion  -We again discussed the incurable nature of her cancer, and the overall poor prognosis, especially if he/she does not have good response to chemotherapy or progress on chemo -The patient understands the goal of care is palliative. -she is full code for now   6. Bladder Infection, sinus infection -She is seeing her Nephrologist today, will discuss with them    Plan -PET scan reviewed, NED, I informed her daughter also  -Lab, f/u and Fulvestrant injection on 5/29   No problem-specific Assessment & Plan notes found for this encounter.   No orders of the defined types were placed in this encounter.  I discussed the assessment and treatment plan with the patient. The patient was provided an opportunity to ask questions and all were answered. The patient agreed with the plan and demonstrated an understanding of the instructions.  The patient was advised to call back or seek an in-person evaluation if the symptoms worsen or if the condition fails to improve as anticipated.  I provided 12 minutes of non face-to-face telephone visit time during this encounter, and > 50% was spent counseling as documented under my assessment & plan.     Truitt Merle, MD 04/07/2019   I, Joslyn Devon, am acting as scribe for Truitt Merle, MD.   I have reviewed the above documentation for accuracy and completeness, and I agree with the above.

## 2019-04-04 ENCOUNTER — Other Ambulatory Visit: Payer: Self-pay

## 2019-04-04 ENCOUNTER — Ambulatory Visit (HOSPITAL_COMMUNITY)
Admission: RE | Admit: 2019-04-04 | Discharge: 2019-04-04 | Disposition: A | Discharge status: Home / Self Care | Payer: PPO | Source: Ambulatory Visit | Attending: Hematology | Admitting: Hematology

## 2019-04-04 DIAGNOSIS — Z170 Estrogen receptor positive status [ER+]: Secondary | ICD-10-CM | POA: Insufficient documentation

## 2019-04-04 DIAGNOSIS — C50112 Malignant neoplasm of central portion of left female breast: Secondary | ICD-10-CM | POA: Insufficient documentation

## 2019-04-04 DIAGNOSIS — K760 Fatty (change of) liver, not elsewhere classified: Secondary | ICD-10-CM | POA: Insufficient documentation

## 2019-04-04 DIAGNOSIS — C50912 Malignant neoplasm of unspecified site of left female breast: Secondary | ICD-10-CM | POA: Diagnosis not present

## 2019-04-04 LAB — GLUCOSE, CAPILLARY: Glucose-Capillary: 90 mg/dL (ref 70–99)

## 2019-04-04 MED ORDER — FLUDEOXYGLUCOSE F - 18 (FDG) INJECTION
8.5600 | Freq: Once | INTRAVENOUS | Status: AC | PRN
  Administered 2019-04-04: 6.56 via INTRAVENOUS

## 2019-04-07 ENCOUNTER — Inpatient Hospital Stay: Payer: PPO | Attending: Hematology | Admitting: Hematology

## 2019-04-07 ENCOUNTER — Encounter: Payer: Self-pay | Admitting: Hematology

## 2019-04-07 ENCOUNTER — Other Ambulatory Visit: Payer: Self-pay

## 2019-04-07 DIAGNOSIS — Z7982 Long term (current) use of aspirin: Secondary | ICD-10-CM | POA: Insufficient documentation

## 2019-04-07 DIAGNOSIS — Z9012 Acquired absence of left breast and nipple: Secondary | ICD-10-CM | POA: Insufficient documentation

## 2019-04-07 DIAGNOSIS — N281 Cyst of kidney, acquired: Secondary | ICD-10-CM | POA: Insufficient documentation

## 2019-04-07 DIAGNOSIS — Z79811 Long term (current) use of aromatase inhibitors: Secondary | ICD-10-CM | POA: Diagnosis not present

## 2019-04-07 DIAGNOSIS — I700 Atherosclerosis of aorta: Secondary | ICD-10-CM | POA: Insufficient documentation

## 2019-04-07 DIAGNOSIS — Z170 Estrogen receptor positive status [ER+]: Secondary | ICD-10-CM | POA: Diagnosis not present

## 2019-04-07 DIAGNOSIS — M5136 Other intervertebral disc degeneration, lumbar region: Secondary | ICD-10-CM | POA: Insufficient documentation

## 2019-04-07 DIAGNOSIS — F329 Major depressive disorder, single episode, unspecified: Secondary | ICD-10-CM | POA: Insufficient documentation

## 2019-04-07 DIAGNOSIS — Z79899 Other long term (current) drug therapy: Secondary | ICD-10-CM | POA: Insufficient documentation

## 2019-04-07 DIAGNOSIS — I071 Rheumatic tricuspid insufficiency: Secondary | ICD-10-CM | POA: Insufficient documentation

## 2019-04-07 DIAGNOSIS — Z79818 Long term (current) use of other agents affecting estrogen receptors and estrogen levels: Secondary | ICD-10-CM | POA: Insufficient documentation

## 2019-04-07 DIAGNOSIS — C50112 Malignant neoplasm of central portion of left female breast: Secondary | ICD-10-CM | POA: Diagnosis not present

## 2019-04-07 DIAGNOSIS — M4316 Spondylolisthesis, lumbar region: Secondary | ICD-10-CM | POA: Insufficient documentation

## 2019-04-07 DIAGNOSIS — Z923 Personal history of irradiation: Secondary | ICD-10-CM | POA: Insufficient documentation

## 2019-04-07 DIAGNOSIS — K219 Gastro-esophageal reflux disease without esophagitis: Secondary | ICD-10-CM | POA: Insufficient documentation

## 2019-04-07 DIAGNOSIS — K760 Fatty (change of) liver, not elsewhere classified: Secondary | ICD-10-CM | POA: Insufficient documentation

## 2019-04-07 DIAGNOSIS — N3020 Other chronic cystitis without hematuria: Secondary | ICD-10-CM | POA: Diagnosis not present

## 2019-04-07 DIAGNOSIS — I2510 Atherosclerotic heart disease of native coronary artery without angina pectoris: Secondary | ICD-10-CM | POA: Insufficient documentation

## 2019-04-07 DIAGNOSIS — I10 Essential (primary) hypertension: Secondary | ICD-10-CM | POA: Diagnosis not present

## 2019-04-07 DIAGNOSIS — M810 Age-related osteoporosis without current pathological fracture: Secondary | ICD-10-CM | POA: Insufficient documentation

## 2019-04-08 ENCOUNTER — Telehealth: Payer: Self-pay | Admitting: Hematology

## 2019-04-08 NOTE — Telephone Encounter (Signed)
Scheduled appt per 6/8 los. Spoke with patient and she is aware of her appt date and time.

## 2019-04-28 ENCOUNTER — Other Ambulatory Visit: Payer: Self-pay

## 2019-04-28 ENCOUNTER — Ambulatory Visit: Payer: PPO

## 2019-04-28 ENCOUNTER — Encounter: Payer: Self-pay | Admitting: Nurse Practitioner

## 2019-04-28 ENCOUNTER — Inpatient Hospital Stay (HOSPITAL_BASED_OUTPATIENT_CLINIC_OR_DEPARTMENT_OTHER): Payer: PPO | Admitting: Nurse Practitioner

## 2019-04-28 ENCOUNTER — Inpatient Hospital Stay: Payer: PPO

## 2019-04-28 VITALS — BP 139/83 | HR 75 | Temp 97.8°F | Resp 18 | Ht 60.0 in | Wt 131.1 lb

## 2019-04-28 DIAGNOSIS — K219 Gastro-esophageal reflux disease without esophagitis: Secondary | ICD-10-CM | POA: Diagnosis not present

## 2019-04-28 DIAGNOSIS — M5136 Other intervertebral disc degeneration, lumbar region: Secondary | ICD-10-CM

## 2019-04-28 DIAGNOSIS — F329 Major depressive disorder, single episode, unspecified: Secondary | ICD-10-CM

## 2019-04-28 DIAGNOSIS — Z170 Estrogen receptor positive status [ER+]: Secondary | ICD-10-CM

## 2019-04-28 DIAGNOSIS — I071 Rheumatic tricuspid insufficiency: Secondary | ICD-10-CM | POA: Diagnosis not present

## 2019-04-28 DIAGNOSIS — I2510 Atherosclerotic heart disease of native coronary artery without angina pectoris: Secondary | ICD-10-CM

## 2019-04-28 DIAGNOSIS — I10 Essential (primary) hypertension: Secondary | ICD-10-CM

## 2019-04-28 DIAGNOSIS — M4316 Spondylolisthesis, lumbar region: Secondary | ICD-10-CM | POA: Diagnosis not present

## 2019-04-28 DIAGNOSIS — Z79899 Other long term (current) drug therapy: Secondary | ICD-10-CM

## 2019-04-28 DIAGNOSIS — Z9012 Acquired absence of left breast and nipple: Secondary | ICD-10-CM

## 2019-04-28 DIAGNOSIS — C50112 Malignant neoplasm of central portion of left female breast: Secondary | ICD-10-CM | POA: Diagnosis not present

## 2019-04-28 DIAGNOSIS — N281 Cyst of kidney, acquired: Secondary | ICD-10-CM

## 2019-04-28 DIAGNOSIS — M810 Age-related osteoporosis without current pathological fracture: Secondary | ICD-10-CM

## 2019-04-28 DIAGNOSIS — Z7982 Long term (current) use of aspirin: Secondary | ICD-10-CM

## 2019-04-28 DIAGNOSIS — I700 Atherosclerosis of aorta: Secondary | ICD-10-CM

## 2019-04-28 DIAGNOSIS — Z79818 Long term (current) use of other agents affecting estrogen receptors and estrogen levels: Secondary | ICD-10-CM

## 2019-04-28 DIAGNOSIS — K760 Fatty (change of) liver, not elsewhere classified: Secondary | ICD-10-CM | POA: Diagnosis not present

## 2019-04-28 DIAGNOSIS — Z923 Personal history of irradiation: Secondary | ICD-10-CM

## 2019-04-28 LAB — CBC WITH DIFFERENTIAL/PLATELET
Abs Immature Granulocytes: 0.03 10*3/uL (ref 0.00–0.07)
Basophils Absolute: 0.1 10*3/uL (ref 0.0–0.1)
Basophils Relative: 1 %
Eosinophils Absolute: 0.4 10*3/uL (ref 0.0–0.5)
Eosinophils Relative: 9 %
HCT: 40.7 % (ref 36.0–46.0)
Hemoglobin: 13 g/dL (ref 12.0–15.0)
Immature Granulocytes: 1 %
Lymphocytes Relative: 16 %
Lymphs Abs: 0.8 10*3/uL (ref 0.7–4.0)
MCH: 31 pg (ref 26.0–34.0)
MCHC: 31.9 g/dL (ref 30.0–36.0)
MCV: 96.9 fL (ref 80.0–100.0)
Monocytes Absolute: 0.5 10*3/uL (ref 0.1–1.0)
Monocytes Relative: 9 %
Neutro Abs: 3.2 10*3/uL (ref 1.7–7.7)
Neutrophils Relative %: 64 %
Platelets: 156 10*3/uL (ref 150–400)
RBC: 4.2 MIL/uL (ref 3.87–5.11)
RDW: 13.2 % (ref 11.5–15.5)
WBC: 5 10*3/uL (ref 4.0–10.5)
nRBC: 0 % (ref 0.0–0.2)

## 2019-04-28 LAB — CMP (CANCER CENTER ONLY)
ALT: 15 U/L (ref 0–44)
AST: 21 U/L (ref 15–41)
Albumin: 3.7 g/dL (ref 3.5–5.0)
Alkaline Phosphatase: 83 U/L (ref 38–126)
Anion gap: 9 (ref 5–15)
BUN: 17 mg/dL (ref 8–23)
CO2: 26 mmol/L (ref 22–32)
Calcium: 9 mg/dL (ref 8.9–10.3)
Chloride: 105 mmol/L (ref 98–111)
Creatinine: 0.9 mg/dL (ref 0.44–1.00)
GFR, Est AFR Am: >60 mL/min (ref >60)
GFR, Estimated: 57 mL/min — ABNORMAL LOW (ref >60)
Glucose, Bld: 96 mg/dL (ref 70–99)
Potassium: 4.4 mmol/L (ref 3.5–5.1)
Sodium: 140 mmol/L (ref 135–145)
Total Bilirubin: 0.4 mg/dL (ref 0.3–1.2)
Total Protein: 6.5 g/dL (ref 6.5–8.1)

## 2019-04-28 MED ORDER — FULVESTRANT 250 MG/5ML IM SOLN
INTRAMUSCULAR | Status: AC
  Filled 2019-04-28: qty 10

## 2019-04-28 MED ORDER — FULVESTRANT 250 MG/5ML IM SOLN
500.0000 mg | Freq: Once | INTRAMUSCULAR | Status: AC
  Administered 2019-04-28: 500 mg via INTRAMUSCULAR

## 2019-04-28 NOTE — Patient Instructions (Signed)
Fulvestrant injection What is this medicine? FULVESTRANT (ful VES trant) blocks the effects of estrogen. It is used to treat breast cancer. This medicine may be used for other purposes; ask your health care provider or pharmacist if you have questions. COMMON BRAND NAME(S): FASLODEX What should I tell my health care provider before I take this medicine? They need to know if you have any of these conditions:  bleeding disorders  liver disease  low blood counts, like low white cell, platelet, or red cell counts  an unusual or allergic reaction to fulvestrant, other medicines, foods, dyes, or preservatives  pregnant or trying to get pregnant  breast-feeding How should I use this medicine? This medicine is for injection into a muscle. It is usually given by a health care professional in a hospital or clinic setting. Talk to your pediatrician regarding the use of this medicine in children. Special care may be needed. Overdosage: If you think you have taken too much of this medicine contact a poison control center or emergency room at once. NOTE: This medicine is only for you. Do not share this medicine with others. What if I miss a dose? It is important not to miss your dose. Call your doctor or health care professional if you are unable to keep an appointment. What may interact with this medicine?  medicines that treat or prevent blood clots like warfarin, enoxaparin, dalteparin, apixaban, dabigatran, and rivaroxaban This list may not describe all possible interactions. Give your health care provider a list of all the medicines, herbs, non-prescription drugs, or dietary supplements you use. Also tell them if you smoke, drink alcohol, or use illegal drugs. Some items may interact with your medicine. What should I watch for while using this medicine? Your condition will be monitored carefully while you are receiving this medicine. You will need important blood work done while you are taking  this medicine. Do not become pregnant while taking this medicine or for at least 1 year after stopping it. Women of child-bearing potential will need to have a negative pregnancy test before starting this medicine. Women should inform their doctor if they wish to become pregnant or think they might be pregnant. There is a potential for serious side effects to an unborn child. Men should inform their doctors if they wish to father a child. This medicine may lower sperm counts. Talk to your health care professional or pharmacist for more information. Do not breast-feed an infant while taking this medicine or for 1 year after the last dose. What side effects may I notice from receiving this medicine? Side effects that you should report to your doctor or health care professional as soon as possible:  allergic reactions like skin rash, itching or hives, swelling of the face, lips, or tongue  feeling faint or lightheaded, falls  pain, tingling, numbness, or weakness in the legs  signs and symptoms of infection like fever or chills; cough; flu-like symptoms; sore throat  vaginal bleeding Side effects that usually do not require medical attention (report to your doctor or health care professional if they continue or are bothersome):  aches, pains  constipation  diarrhea  headache  hot flashes  nausea, vomiting  pain at site where injected  stomach pain This list may not describe all possible side effects. Call your doctor for medical advice about side effects. You may report side effects to FDA at 1-800-FDA-1088. Where should I keep my medicine? This drug is given in a hospital or clinic and will   not be stored at home. NOTE: This sheet is a summary. It may not cover all possible information. If you have questions about this medicine, talk to your doctor, pharmacist, or health care provider.  2020 Elsevier/Gold Standard (2018-01-24 11:34:41)  

## 2019-04-28 NOTE — Progress Notes (Signed)
Gloria [name]   [phone] [phone]   Clinic Follow up Note   Patient Care Team: Lajean Manes, MD as PCP - [place], Ch[place]adero, MD as Consulting Physician (General Surgery) Truitt Merle, MD as Consulting Physician (Hematology) [name], MD as Consulting Physician (Radiation Oncology) Sylvan Cheese, NP as Nurse Practitioner (Hematology and Oncology) Karen Kays, NP as Nurse Practitioner (Nurse Practitioner) 04/28/2019  CHIEF COMPLAINT: f/u left breast invasive lobular carcinoma   SUMMARY OF ONCOLOGIC HISTORY: Oncology History Overview Note  Cancer of central portion of left female breast [facility])   Staging form: Breast, AJCC 7th Edition     Pathologic stage from 08/04/2010: Stage IIB (T2, N1a, cM0) - Signed by Truitt Merle, MD on 12/09/2015     Cancer of central portion of left female breast ([place])  08/03/2010 Mammogram    left nipple retraction, and the palpable mass subareolar left breast at the 6:00 position. Ultrasound confirmed the presence of a mass measuring 2.6 x 1.7 x 2.4 cm   08/04/2010 Initial Biopsy   left breast mass biopsy showed an invasive mammary carcinoma with lobular features. ER 95%, PR 97%, Ki-67 17%, HER-2/neu (-)   08/04/2010 Pathologic Stage   Stage IIB: T2 N1a   08/11/2010 Imaging   left breast mass 4.6 x 4.2 x 2.3 cm with enhancement distortion extending to the nipple retraction. There is questionable anterior mediastinal lymph node seen   07/2010 - 09/03/2010 Anti-estrogen oral therapy   neoadjuvant letrozole    09/03/2010 Surgery    left breast mastectomy with sentinel node biopsy on 09/03/2010.   08/2010 - 01/2015 Anti-estrogen oral therapy   Tamoxifen, pt stopped on her own    12/03/2010 - 01/23/2011 Radiation Therapy   left chest wall and axilla radiaiton    10/19/2015 Progression   Left chest wall recurrence, s/p resection on 11/25/2015 with positive margins ([facility])    12/10/2015 - 05/31/2017  Anti-estrogen oral therapy   anastrozole 66m daily, which was switched to Tamoxifen on 06/30/2016 due to persistent disease.   01/03/2016 - 03/03/2016 Radiation Therapy   Radiation to left chest wall recurrence (12/06/2015-02/04/2016 - 44 Gy), pt progressed through the radiation, and had additional 10 fractions of radiation (02/21/16-03/03/2016 - total 70 Gy to the left chest wall)   07/14/2016 PET scan   PET 07/14/2016 IMPRESSION: 1. No findings of recurrent malignancy. 2. Radiation fibrosis in the lingula. A small left axillary lymph node is not hypermetabolic and only 6 mm in diameter. This is in the vicinity of the prior axillary cyst. 3. Pelvic floor laxity.   07/17/2016 Surgery   Chest wall soft tissue resection for local recurrence   07/17/2016 Pathology Results   Left chest wall two soft tissue resection both showed invasive lobular carcinoma, margins were positive. ER 95% positive, PR 60% positive, HER-2 negative.   07/27/2016 Imaging   CT chest, abdomen and pelvis wo contrast  07/27/2016 IMPRESSION: Sigmoid diverticulosis without acute diverticulitis. No bowel obstruction or acute inflammation. Degenerative disc disease and facet arthropathy of the lower lumbar spine with slight grade 1 anterolisthesis of L4 on L5. Stable appearing simple left-sided renal cysts.   03/01/2017 Mammogram   Mammogram 03/01/2017 IMPRESSION: No mammographic evidence of malignancy. A result letter of this screening mammogram will be mailed directly to the patient. RECOMMENDATION: Screening mammogram in one year   04/10/2017 Imaging   PET IMPRESSION: 1. Stable exam.  No findings of recurrent malignancy. 2. Changes of external beam radiation again noted  within the lingula. 3. Small left axillary lymph node exhibits mild increased uptake. Unchanged from previous exam.   06/07/2017 -  Anti-estrogen oral therapy   Stopped Tamoxifen and Began Faslodex IM injection on 06/07/17    09/19/2017 Imaging   CT CAP WO  Contrast 09/19/17 IMPRESSION: 1. Surgical changes from a left mastectomy without CT findings to suggest recurrent chest wall tumor or axillary lymphadenopathy. 2. No findings to suggest metastatic disease involving the neck, chest, abdomen or pelvis or osseous structures. 3. Stable radiation changes involving the lingula.    09/19/2017 Imaging   Bone Scan Whole Body  IMPRESSION: No definite scintigraphic evidence of osseous metastatic disease. Scattered degenerative type uptake as above.   03/11/2018 PET scan   IMPRESSION:  1. No evidence of recurrent disease. 2.  Aortic atherosclerosis    09/03/2018 PET scan   09/03/2018 PET Scan IMPRESSION: 1. No findings to suggest locally recurrent disease or metastatic disease in the neck, chest, abdomen or pelvis. 2. Hepatic steatosis. 3. Colonic diverticulosis without evidence of acute diverticulitis at this time. 4. Aortic atherosclerosis. 5. Additional incidental findings, as above.   11/25/2018 Imaging   DG Ribs Unilateral W/Chest Left  IMPRESSION: Stable left lingular scarring. Normal left ribs. No acute cardiopulmonary abnormality seen.    04/04/2019 PET scan   PET  IMPRESSION: 1. No evidence of hypermetabolic recurrent or metastatic disease. 2. Hepatic steatosis. 3.  Aortic Atherosclerosis (ICD10-I70.0).     CURRENT THERAPY: monthly Faslodex injection started on 06/07/17  INTERVAL HISTORY: [name] returns for f/u and injection as scheduled. She had 2 weeks pain at injection site after last injection on 5/29. She reports mild pain in various parts of her body including right back, hands, and feet. She has some more difficulty walking, denies fall or need for assistive devices. She takes tylenol at bedtime. Pain occurs daily, started 1 month ago. She recently completed macrobid for UTI. She frequently urinates; denies dysuria, hematuria, urgency. Followed by nephrologist. Appetite is normal. Denies n/v/c/d. Denies bleeding.  Denies concerns in her breast.     MEDICAL HISTORY:  Past Medical History:  Diagnosis Date   Anxiety    Blood in urine    CAD (coronary artery disease)    non obstructive by cath   Cancer [facility])    breast- left   Depression    GERD (gastroesophageal reflux disease)    Hypertension    Knee fracture    Personal history of radiation therapy 2017   TR (tricuspid regurgitation)    mild by Echo 12/2008 EF >55%    SURGICAL HISTORY: Past Surgical History:  Procedure Laterality Date   ABDOMINAL AORTAGRAM N/A 09/08/2011   Procedure: ABDOMINAL Maxcine Ham;  Surgeon: Troy Sine, MD;  [facility] CATH LAB;  Service: Cardiovascular;  Laterality: N/A;   ABDOMINAL HYSTERECTOMY     BREAST EXCISIONAL BIOPSY Left 2017   X3   BREAST SURGERY  08-30-10   mastectomy   CARDIAC CATHETERIZATION  08/2011   20% LAD stenosis, 20% diagonal stenosis, 10-20% proximal dominant RCA stenosis   COLON SURGERY     LEFT HEART CATHETERIZATION WITH CORONARY ANGIOGRAM N/A 09/08/2011   Procedure: LEFT HEART CATHETERIZATION WITH CORONARY ANGIOGRAM;  Surgeon: Troy Sine, MD;  Location: [facility] CATH LAB;  Service: Cardiovascular;  Laterality: N/A;   MASTECTOMY     MINOR BREAST BIOPSY Left 11/25/2015   Procedure: MINOR EXCISION MASS LEFT CHEST WALL;  Surgeon: Autumn Messing III, MD;  Location: [place];  Service:  General;  Laterality: Left;   MINOR BREAST BIOPSY Left 07/17/2016   Procedure: EXCISION OF 2 LEFT CHEST WALL MASSES;  Surgeon: Autumn Messing III, MD;  Location: [place];  Service: General;  Laterality: Left;   TONSILLECTOMY      I have reviewed the social history and family history with the patient and they are unchanged from previous note.  ALLERGIES:  is allergic to contrast media [iodinated diagnostic agents]; penicillins; adhesive [tape]; cephalexin; ciprofloxacin; demerol; sulfamethoxazole-trimethoprim; and quinolones.  MEDICATIONS:  Current Outpatient  Medications  Medication Sig Dispense Refill   acetaminophen (TYLENOL) 500 MG tablet Take 1,000 mg by mouth daily as needed for moderate pain. Reported on 03/10/2016     ALPRAZolam (XANAX) 0.25 MG tablet Take 1/2 tablet by mouth in the morning and 1 tab at bedtime as needed 45 tablet 1   aspirin 81 MG chewable tablet Chew 81 mg by mouth once a week. Reported on 03/10/2016     CALCIUM-MAGNESIUM-VITAMIN D PO Take 1 tablet by mouth 2 (two) times daily.     cetirizine (ZYRTEC) 10 MG tablet Take 10 mg by mouth as needed for allergies.      diphenhydrAMINE (BENADRYL) 50 MG tablet Take 1 tablet (50 mg total) by mouth as directed. Take Benadryl 50 mg po 1 hour before CT scan. 1 tablet 0   doxepin (SINEQUAN) 10 MG capsule Take 10 mg by mouth at bedtime.      fluticasone (FLONASE) 50 MCG/ACT nasal spray Place 1 spray into both nostrils daily as needed for allergies.      guaiFENesin (MUCINEX) 600 MG 12 hr tablet Take 600 mg by mouth daily as needed for cough or to loosen phlegm. Reported on 03/28/2016     lidocaine-prilocaine (EMLA) cream Apply 1 application topically 2 (two) times daily as needed. 30 g 1   losartan (COZAAR) 25 MG tablet Take 25 mg by mouth daily.   0   nitroGLYCERIN (NITROSTAT) 0.4 MG SL tablet Place 1 tablet (0.4 mg total) under the tongue every 5 (five) minutes as needed for chest pain. <PLEASE MAKE APPOINTMENT> 25 tablet 3   potassium chloride (K-DUR) 10 MEQ tablet Take 10 mEq by mouth as needed.      pseudoephedrine-acetaminophen (TYLENOL SINUS) 30-500 MG TABS tablet Take 1 tablet by mouth every 4 (four) hours as needed.     triamcinolone (KENALOG) 0.025 % cream Apply 1 application topically 2 (two) times daily. 30 g 0   fluconazole (DIFLUCAN) 100 MG tablet Take 1 tablet (100 mg total) by mouth daily. 1 tablet 0   No current facility-administered medications for this visit.     PHYSICAL EXAMINATION: ECOG PERFORMANCE STATUS: 1 - Symptomatic but completely  ambulatory  Vitals:   04/28/19 1455  BP: 139/83  Pulse: 75  Resp: 18  Temp: 97.8 F (36.6 C)  SpO2: 98%   Filed Weights   04/28/19 1455  Weight: 131 lb 1.6 oz (59.5 kg)    GENERAL:alert, no distress and comfortable SKIN:  no rashes or significant lesions EYES: sclera clear LUNGS: respirations even and unlabored  HEART:  no lower extremity edema Musculoskeletal:no cyanosis of digits. Mild tenderness to right lower back  NEURO: alert & oriented x 3 with fluent speech, stable gait  BREAST: patient declined breast exam Limited exam for covid19 outbreak   LABORATORY DATA:  I have reviewed the data as listed CBC Latest Ref Rng & Units 04/28/2019 02/27/2019 12/05/2018  WBC 4.0 - 10.5 K/uL 5.0 3.5(L) 3.6(L)  Hemoglobin 12.0 - 15.0 g/dL 13.0 13.2 14.3  Hematocrit 36.0 - 46.0 % 40.7 41.3 44.3  Platelets 150 - 400 K/uL 156 136(L) 150     CMP Latest Ref Rng & Units 04/28/2019 02/27/2019 12/05/2018  Glucose 70 - 99 mg/dL 96 80 124(H)  BUN 8 - 23 mg/dL 17 17 16   Creatinine 0.44 - 1.00 mg/dL 0.90 0.87 0.90  Sodium 135 - 145 mmol/L 140 143 143  Potassium 3.5 - 5.1 mmol/L 4.4 3.9 3.8  Chloride 98 - 111 mmol/L 105 106 105  CO2 22 - 32 mmol/L 26 27 29   Calcium 8.9 - 10.3 mg/dL 9.0 9.1 9.7  Total Protein 6.5 - 8.1 g/dL 6.5 6.6 6.8  Total Bilirubin 0.3 - 1.2 mg/dL 0.4 0.4 0.8  Alkaline Phos 38 - 126 U/L 83 92 101  AST 15 - 41 U/L 21 22 19   ALT 0 - 44 U/L 15 13 14       RADIOGRAPHIC STUDIES: I have personally reviewed the radiological images as listed and agreed with the findings in the report. No results found.   ASSESSMENT & PLAN: EVITA MERIDA is a 85 y.o. female with   1. Cancer of the central portion of left female breast, Stage IIB, T2 N1 invasive lobular carcinoma, grade 2, ER 95%, PR 97%, Ki-67 17%, HER-2/neu no amplification, diagnosed in 07/2010, local chest wall recurrence in 09/2015  -She wasdiagnosed in 10/2011but hadchest wall local recurrence in 09/2015. She was on  adjuvant tamoxifen for 4.5 years, stopped on her own anddeveloped local recurrence 8 months after she stopped tamoxifen. -She has completed chest wall radiation, however developedlocal recurrence again andunderwent a second chest wall surgery for two soft tissue resection in 06/2016. Unfortunately the surgicalmargins were positive, Dr. Marlou Starks Dr. Marlou Starks did not offer more surgery because complete surgical resection is unlikely. Patient is also not in favor of more extensive surgery. -She began fulvestrant injection on 06/07/2017, she has been tolerating well overall.  -[name] previously reviewed 04/04/19 PET which was negative for recurrent or metastatic disease 2. HTN, CAD - f/u PCP  3. Osteoporosis -Her last DEXA scan in 06/2012 showed osteoporosis, recommended calcium and vitamin D 4. Left Chest Pain Secondary to the small tumor nodules on the chest wall which previously resolved  5. Goal of care discussion - treatment goal is palliative, full code  6. Recent UTI s/p course of macrobid, followed by nephrology   Ms. Salo appears well today. She continues to tolerate monthly fulvestrant moderately well. She notes increased pain at injection site and in general in her joints. But mild, and tolerable. Pain managed with tylenol. She does not require assistive device. I strongly encouraged her to use them if pain worsens or she feels her balance/gait is unsteady. She understands. Labs are unremarkable; BP 139/83, improved. She declined breast exam today. Proceed with fulvestrant injection today and continue monthly, f/u in 2 months or sooner if pain worsens or she develops new concerns.   PLAN: -Fulvestrant injection today, and monthly -F/u in 2 months   All questions were answered. The patient knows to call the clinic with any problems, questions or concerns. No barriers to learning was detected.     Alla Feeling, NP 04/28/19

## 2019-04-29 ENCOUNTER — Telehealth: Payer: Self-pay | Admitting: Nurse Practitioner

## 2019-04-29 LAB — CANCER ANTIGEN 27.29: CA 27.29: 43.4 U/mL — ABNORMAL HIGH (ref 0.0–38.6)

## 2019-04-29 NOTE — Telephone Encounter (Signed)
Scheduled appt per 6/29 los. Patient stated she was busy and was not around her calendar.  Print and mailed calendar.

## 2019-05-16 DIAGNOSIS — N3020 Other chronic cystitis without hematuria: Secondary | ICD-10-CM | POA: Diagnosis not present

## 2019-05-20 DIAGNOSIS — N3020 Other chronic cystitis without hematuria: Secondary | ICD-10-CM | POA: Diagnosis not present

## 2019-05-28 ENCOUNTER — Inpatient Hospital Stay: Payer: PPO | Attending: Hematology

## 2019-05-28 ENCOUNTER — Other Ambulatory Visit: Payer: Self-pay

## 2019-05-28 VITALS — BP 159/79 | HR 67 | Temp 98.6°F | Resp 16

## 2019-05-28 DIAGNOSIS — Z79818 Long term (current) use of other agents affecting estrogen receptors and estrogen levels: Secondary | ICD-10-CM | POA: Diagnosis not present

## 2019-05-28 DIAGNOSIS — Z170 Estrogen receptor positive status [ER+]: Secondary | ICD-10-CM | POA: Diagnosis not present

## 2019-05-28 DIAGNOSIS — C50112 Malignant neoplasm of central portion of left female breast: Secondary | ICD-10-CM | POA: Diagnosis not present

## 2019-05-28 MED ORDER — FULVESTRANT 250 MG/5ML IM SOLN
500.0000 mg | INTRAMUSCULAR | Status: DC
  Administered 2019-05-28: 12:00:00 500 mg via INTRAMUSCULAR

## 2019-05-28 NOTE — Patient Instructions (Signed)
Fulvestrant injection What is this medicine? FULVESTRANT (ful VES trant) blocks the effects of estrogen. It is used to treat breast cancer. This medicine may be used for other purposes; ask your health care provider or pharmacist if you have questions. COMMON BRAND NAME(S): FASLODEX What should I tell my health care provider before I take this medicine? They need to know if you have any of these conditions:  bleeding disorders  liver disease  low blood counts, like low white cell, platelet, or red cell counts  an unusual or allergic reaction to fulvestrant, other medicines, foods, dyes, or preservatives  pregnant or trying to get pregnant  breast-feeding How should I use this medicine? This medicine is for injection into a muscle. It is usually given by a health care professional in a hospital or clinic setting. Talk to your pediatrician regarding the use of this medicine in children. Special care may be needed. Overdosage: If you think you have taken too much of this medicine contact a poison control center or emergency room at once. NOTE: This medicine is only for you. Do not share this medicine with others. What if I miss a dose? It is important not to miss your dose. Call your doctor or health care professional if you are unable to keep an appointment. What may interact with this medicine?  medicines that treat or prevent blood clots like warfarin, enoxaparin, dalteparin, apixaban, dabigatran, and rivaroxaban This list may not describe all possible interactions. Give your health care provider a list of all the medicines, herbs, non-prescription drugs, or dietary supplements you use. Also tell them if you smoke, drink alcohol, or use illegal drugs. Some items may interact with your medicine. What should I watch for while using this medicine? Your condition will be monitored carefully while you are receiving this medicine. You will need important blood work done while you are taking  this medicine. Do not become pregnant while taking this medicine or for at least 1 year after stopping it. Women of child-bearing potential will need to have a negative pregnancy test before starting this medicine. Women should inform their doctor if they wish to become pregnant or think they might be pregnant. There is a potential for serious side effects to an unborn child. Men should inform their doctors if they wish to father a child. This medicine may lower sperm counts. Talk to your health care professional or pharmacist for more information. Do not breast-feed an infant while taking this medicine or for 1 year after the last dose. What side effects may I notice from receiving this medicine? Side effects that you should report to your doctor or health care professional as soon as possible:  allergic reactions like skin rash, itching or hives, swelling of the face, lips, or tongue  feeling faint or lightheaded, falls  pain, tingling, numbness, or weakness in the legs  signs and symptoms of infection like fever or chills; cough; flu-like symptoms; sore throat  vaginal bleeding Side effects that usually do not require medical attention (report to your doctor or health care professional if they continue or are bothersome):  aches, pains  constipation  diarrhea  headache  hot flashes  nausea, vomiting  pain at site where injected  stomach pain This list may not describe all possible side effects. Call your doctor for medical advice about side effects. You may report side effects to FDA at 1-800-FDA-1088. Where should I keep my medicine? This drug is given in a hospital or clinic and will   not be stored at home. NOTE: This sheet is a summary. It may not cover all possible information. If you have questions about this medicine, talk to your doctor, pharmacist, or health care provider.  2020 Elsevier/Gold Standard (2018-01-24 11:34:41)  

## 2019-06-22 ENCOUNTER — Other Ambulatory Visit: Payer: Self-pay | Admitting: Hematology

## 2019-06-22 DIAGNOSIS — C50112 Malignant neoplasm of central portion of left female breast: Secondary | ICD-10-CM

## 2019-06-24 ENCOUNTER — Other Ambulatory Visit: Payer: Self-pay | Admitting: Nurse Practitioner

## 2019-06-24 DIAGNOSIS — C50112 Malignant neoplasm of central portion of left female breast: Secondary | ICD-10-CM

## 2019-06-24 MED ORDER — ALPRAZOLAM 0.25 MG PO TABS: ORAL_TABLET | ORAL | 1 refills | Status: DC

## 2019-06-26 NOTE — Progress Notes (Signed)
[place]   [phone] [phone]   Clinic Follow up Note   Patient Care Team: Lajean Manes, MD as PCP - [place], [place], MD as Consulting Physician (General Surgery) Truitt Merle, MD as Consulting Physician (Hematology) [name], MD as Consulting Physician (Radiation Oncology) Sylvan Cheese, NP as Nurse Practitioner (Hematology and Oncology) Karen Kays, NP as Nurse Practitioner (Nurse Practitioner)  Date of Service:  06/27/2019  CHIEF COMPLAINT: F/uinvasive lobular carcinoma of the left breast  SUMMARY OF ONCOLOGIC HISTORY: Oncology History Overview Note  Cancer of central portion of left female breast [facility])   Staging form: Breast, AJCC 7th Edition     Pathologic stage from 08/04/2010: Stage IIB (T2, N1a, cM0) - Signed by Truitt Merle, MD on 12/09/2015     Cancer of central portion of left female breast ([place])  08/03/2010 Mammogram    left nipple retraction, and the palpable mass subareolar left breast at the 6:00 position. Ultrasound confirmed the presence of a mass measuring 2.6 x 1.7 x 2.4 cm   08/04/2010 Initial Biopsy   left breast mass biopsy showed an invasive mammary carcinoma with lobular features. ER 95%, PR 97%, Ki-67 17%, HER-2/neu (-)   08/04/2010 Pathologic Stage   Stage IIB: T2 N1a   08/11/2010 Imaging   left breast mass 4.6 x 4.2 x 2.3 cm with enhancement distortion extending to the nipple retraction. There is questionable anterior mediastinal lymph node seen   07/2010 - 09/03/2010 Anti-estrogen oral therapy   neoadjuvant letrozole    09/03/2010 Surgery    left breast mastectomy with sentinel node biopsy on 09/03/2010.   08/2010 - 01/2015 Anti-estrogen oral therapy   Tamoxifen, pt stopped on her own    12/03/2010 - 01/23/2011 Radiation Therapy   left chest wall and axilla radiaiton    10/19/2015 Progression   Left chest wall recurrence, s/p resection on 11/25/2015 with positive margins (IL[facility])    12/10/2015 - 05/31/2017 Anti-estrogen oral therapy   anastrozole 67m daily, which was switched to Tamoxifen on 06/30/2016 due to persistent disease.   01/03/2016 - 03/03/2016 Radiation Therapy   Radiation to left chest wall recurrence (12/06/2015-02/04/2016 - 44 Gy), pt progressed through the radiation, and had additional 10 fractions of radiation (02/21/16-03/03/2016 - total 70 Gy to the left chest wall)   07/14/2016 PET scan   PET 07/14/2016 IMPRESSION: 1. No findings of recurrent malignancy. 2. Radiation fibrosis in the lingula. A small left axillary lymph node is not hypermetabolic and only 6 mm in diameter. This is in the vicinity of the prior axillary cyst. 3. Pelvic floor laxity.   07/17/2016 Surgery   Chest wall soft tissue resection for local recurrence   07/17/2016 Pathology Results   Left chest wall two soft tissue resection both showed invasive lobular carcinoma, margins were positive. ER 95% positive, PR 60% positive, HER-2 negative.   07/27/2016 Imaging   CT chest, abdomen and pelvis wo contrast  07/27/2016 IMPRESSION: Sigmoid diverticulosis without acute diverticulitis. No bowel obstruction or acute inflammation. Degenerative disc disease and facet arthropathy of the lower lumbar spine with slight grade 1 anterolisthesis of L4 on L5. Stable appearing simple left-sided renal cysts.   03/01/2017 Mammogram   Mammogram 03/01/2017 IMPRESSION: No mammographic evidence of malignancy. A result letter of this screening mammogram will be mailed directly to the patient. RECOMMENDATION: Screening mammogram in one year   04/10/2017 Imaging   PET IMPRESSION: 1. Stable exam.  No findings of recurrent malignancy. 2. Changes of external  beam radiation again noted within the lingula. 3. Small left axillary lymph node exhibits mild increased uptake. Unchanged from previous exam.   06/07/2017 -  Anti-estrogen oral therapy   Stopped Tamoxifen and Began Faslodex IM injection on 06/07/17    09/19/2017  Imaging   CT CAP WO Contrast 09/19/17 IMPRESSION: 1. Surgical changes from a left mastectomy without CT findings to suggest recurrent chest wall tumor or axillary lymphadenopathy. 2. No findings to suggest metastatic disease involving the neck, chest, abdomen or pelvis or osseous structures. 3. Stable radiation changes involving the lingula.    09/19/2017 Imaging   Bone Scan Whole Body  IMPRESSION: No definite scintigraphic evidence of osseous metastatic disease. Scattered degenerative type uptake as above.   03/11/2018 PET scan   IMPRESSION:  1. No evidence of recurrent disease. 2.  Aortic atherosclerosis    09/03/2018 PET scan   09/03/2018 PET Scan IMPRESSION: 1. No findings to suggest locally recurrent disease or metastatic disease in the neck, chest, abdomen or pelvis. 2. Hepatic steatosis. 3. Colonic diverticulosis without evidence of acute diverticulitis at this time. 4. Aortic atherosclerosis. 5. Additional incidental findings, as above.   11/25/2018 Imaging   DG Ribs Unilateral W/Chest Left  IMPRESSION: Stable left lingular scarring. Normal left ribs. No acute cardiopulmonary abnormality seen.    04/04/2019 PET scan   PET  IMPRESSION: 1. No evidence of hypermetabolic recurrent or metastatic disease. 2. Hepatic steatosis. 3.  Aortic Atherosclerosis (ICD10-I70.0).      CURRENT THERAPY:  monthly Faslodex injection started on 06/07/17  INTERVAL HISTORY:  Gloria [name] is here for a follow up. She presents to the clinic alone. She notes she is doing fair. She notes difficulty walking from pain due to swelling of both posterior knees. She notes her feet are freezing cold at night time. She notes stable mild-low back pain. This is tolerable.     REVIEW OF SYSTEMS:   Constitutional: Denies fevers, chills or abnormal weight loss Eyes: Denies blurriness of vision Ears, nose, mouth, throat, and face: Denies mucositis or sore throat Respiratory: Denies cough, dyspnea  or wheezes Cardiovascular: Denies palpitation, chest discomfort (+) Swelling of b/l posterior knees  Gastrointestinal:  Denies nausea, heartburn or change in bowel habits Skin: Denies abnormal skin rashes MSK: (+) mid low back pain, stable  Lymphatics: Denies new lymphadenopathy or easy bruising Neurological:Denies numbness, tingling or new weaknesses Behavioral/Psych: Mood is stable, no new changes  All other systems were reviewed with the patient and are negative.  MEDICAL HISTORY:  Past Medical History:  Diagnosis Date   Anxiety    Blood in urine    CAD (coronary artery disease)    non obstructive by cath   Cancer [facility])    breast- left   Depression    GERD (gastroesophageal reflux disease)    Hypertension    Knee fracture    Personal history of radiation therapy 2017   TR (tricuspid regurgitation)    mild by Echo 12/2008 EF >55%    SURGICAL HISTORY: Past Surgical History:  Procedure Laterality Date   ABDOMINAL AORTAGRAM N/A 09/08/2011   Procedure: ABDOMINAL Maxcine Ham;  Surgeon: Troy Sine, MD;  Location: [facility] CATH LAB;  Service: Cardiovascular;  Laterality: N/A;   ABDOMINAL HYSTERECTOMY     BREAST EXCISIONAL BIOPSY Left 2017   X3   BREAST SURGERY  08-30-10   mastectomy   CARDIAC CATHETERIZATION  08/2011   20% LAD stenosis, 20% diagonal stenosis, 10-20% proximal dominant RCA stenosis   COLON SURGERY  LEFT HEART CATHETERIZATION WITH CORONARY ANGIOGRAM N/A 09/08/2011   Procedure: LEFT HEART CATHETERIZATION WITH CORONARY ANGIOGRAM;  Surgeon: Troy Sine, MD;  Location: [facility] CATH LAB;  Service: Cardiovascular;  Laterality: N/A;   MASTECTOMY     MINOR BREAST BIOPSY Left 11/25/2015   Procedure: MINOR EXCISION MASS LEFT CHEST WALL;  Surgeon: Autumn Messing III, MD;  Location: [place];  Service: General;  Laterality: Left;   MINOR BREAST BIOPSY Left 07/17/2016   Procedure: EXCISION OF 2 LEFT CHEST WALL MASSES;  Surgeon: Autumn Messing III, MD;   Location: [place];  Service: General;  Laterality: Left;   TONSILLECTOMY      I have reviewed the social history and family history with the patient and they are unchanged from previous note.  ALLERGIES:  is allergic to contrast media [iodinated diagnostic agents]; penicillins; adhesive [tape]; cephalexin; ciprofloxacin; demerol; sulfamethoxazole-trimethoprim; and quinolones.  MEDICATIONS:  Current Outpatient Medications  Medication Sig Dispense Refill   acetaminophen (TYLENOL) 500 MG tablet Take 1,000 mg by mouth daily as needed for moderate pain. Reported on 03/10/2016     ALPRAZolam (XANAX) 0.25 MG tablet Take 1/2 tablet by mouth in the morning and 1 tab at bedtime as needed 45 tablet 1   aspirin 81 MG chewable tablet Chew 81 mg by mouth once a week. Reported on 03/10/2016     CALCIUM-MAGNESIUM-VITAMIN D PO Take 1 tablet by mouth 2 (two) times daily.     cetirizine (ZYRTEC) 10 MG tablet Take 10 mg by mouth as needed for allergies.      diphenhydrAMINE (BENADRYL) 50 MG tablet Take 1 tablet (50 mg total) by mouth as directed. Take Benadryl 50 mg po 1 hour before CT scan. 1 tablet 0   doxepin (SINEQUAN) 10 MG capsule Take 10 mg by mouth at bedtime.      fluconazole (DIFLUCAN) 100 MG tablet Take 1 tablet (100 mg total) by mouth daily. 1 tablet 0   fluticasone (FLONASE) 50 MCG/ACT nasal spray Place 1 spray into both nostrils daily as needed for allergies.      guaiFENesin (MUCINEX) 600 MG 12 hr tablet Take 600 mg by mouth daily as needed for cough or to loosen phlegm. Reported on 03/28/2016     lidocaine-prilocaine (EMLA) cream Apply 1 application topically 2 (two) times daily as needed. 30 g 1   losartan (COZAAR) 25 MG tablet Take 25 mg by mouth daily.   0   nitroGLYCERIN (NITROSTAT) 0.4 MG SL tablet Place 1 tablet (0.4 mg total) under the tongue every 5 (five) minutes as needed for chest pain. <PLEASE MAKE APPOINTMENT> 25 tablet 3   potassium chloride (K-DUR) 10  MEQ tablet Take 10 mEq by mouth as needed.      pseudoephedrine-acetaminophen (TYLENOL SINUS) 30-500 MG TABS tablet Take 1 tablet by mouth every 4 (four) hours as needed.     triamcinolone (KENALOG) 0.025 % cream Apply 1 application topically 2 (two) times daily. 30 g 0   No current facility-administered medications for this visit.     PHYSICAL EXAMINATION: ECOG PERFORMANCE STATUS: 1 - Symptomatic but completely ambulatory  Vitals:   06/27/19 1353  BP: (!) 148/72  Pulse: 86  Resp: 20  Temp: 98.5 F (36.9 C)  SpO2: 99%   Filed Weights   06/27/19 1353  Weight: 134 lb (60.8 kg)    GENERAL:alert, no distress and comfortable SKIN: skin color, texture, turgor are normal, no rashes or significant lesions EYES: normal, Conjunctiva are pink and  non-injected, sclera clear  NECK: supple, thyroid normal size, non-tender, without nodularity LYMPH:  no palpable lymphadenopathy in the cervical, axillary  LUNGS: clear to auscultation and percussion with normal breathing effort HEART: regular rate & rhythm and no murmurs and no lower extremity edema ABDOMEN:abdomen soft, non-tender and normal bowel sounds (+) epigastric tenderness. No hepatomegaly  Musculoskeletal:no cyanosis of digits and no clubbing (+) tenderness of b/l posterior knees (+) Right clavicle protrudes more than left (+) Mid-lower back tenderness NEURO: alert & oriented x 3 with fluent speech, no focal motor/sensory deficits BREAST: S/p left mastectomy: Breast surgically absent (+) smaller soft tissue mass in her left chest wall incision. No adenopathy bilaterally.  LABORATORY DATA:  I have reviewed the data as listed CBC Latest Ref Rng & Units 06/27/2019 04/28/2019 02/27/2019  WBC 4.0 - 10.5 K/uL 4.6 5.0 3.5(L)  Hemoglobin 12.0 - 15.0 g/dL 12.8 13.0 13.2  Hematocrit 36.0 - 46.0 % 39.4 40.7 41.3  Platelets 150 - 400 K/uL 148(L) 156 136(L)     CMP Latest Ref Rng & Units 06/27/2019 04/28/2019 02/27/2019  Glucose 70 - 99 mg/dL  124(H) 96 80  BUN 8 - 23 mg/dL _0 Creatinine 0.44 - 1.00 mg/dL 0.89 0.90 0.87  Sodium 135 - 145 mmol/L 142 140 143  Potassium 3.5 - 5.1 mmol/L 3.8 4.4 3.9  Chloride 98 - 111 mmol/L 106 105 106  CO2 22 - 32 mmol/L _1 Calcium 8.9 - 10.3 mg/dL 9.0 9.0 9.1  Total Protein 6.5 - 8.1 g/dL 6.4(L) 6.5 6.6  Total Bilirubin 0.3 - 1.2 mg/dL 0.4 0.4 0.4  Alkaline Phos 38 - 126 U/L 87 83 92  AST 15 - 41 U/L _2 ALT 0 - 44 U/L _3 CA 27.29 11/22/17: 32.3 12/20/17: 25.9 01/17/18: 29.4 03/14/18: 31.9 04/11/18: 55.4 05/09/18: 57.3 07/04/18: 40.8 10/31/18: 39.0 12/05/18: 41.1 02/27/19: 49.2 04/28/19: 43.4 06/27/19: PENDING   RADIOGRAPHIC STUDIES: I have personally reviewed the radiological images as listed and agreed with the findings in the report. Vas [place] Lower Extremity Venous (dvt)  Result Date: 06/27/2019  Lower Venous Study Indications: Pain, and Swelling.  Risk Factors: None identified. Comparison Study: No prior studies. Performing Technologist: Oliver Hum RVT  Examination Guidelines: A complete evaluation includes B-mode imaging, spectral Doppler, color Doppler, and power Doppler as needed of all accessible portions of each vessel. Bilateral testing is considered an integral part of a complete examination. Limited examinations for reoccurring indications may be performed as noted.  +---------+---------------+---------+-----------+----------+--------------+  RIGHT     Compressibility Phasicity Spontaneity Properties Thrombus Aging  +---------+---------------+---------+-----------+----------+--------------+  CFV       Full            Yes       Yes                                    +---------+---------------+---------+-----------+----------+--------------+  SFJ       Full                                                             +---------+---------------+---------+-----------+----------+--------------+  FV Prox   Full                                                              +---------+---------------+---------+-----------+----------+--------------+  FV Mid    Full                                                             +---------+---------------+---------+-----------+----------+--------------+  FV Distal Full                                                             +---------+---------------+---------+-----------+----------+--------------+  PFV       Full                                                             +---------+---------------+---------+-----------+----------+--------------+  POP       Full            Yes       Yes                                    +---------+---------------+---------+-----------+----------+--------------+  PTV       Full                                                             +---------+---------------+---------+-----------+----------+--------------+  PERO      Full                                                             +---------+---------------+---------+-----------+----------+--------------+   +---------+---------------+---------+-----------+----------+--------------+  LEFT      Compressibility Phasicity Spontaneity Properties Thrombus Aging  +---------+---------------+---------+-----------+----------+--------------+  CFV       Full            Yes       Yes                                    +---------+---------------+---------+-----------+----------+--------------+  SFJ       Full                                                             +---------+---------------+---------+-----------+----------+--------------+  FV Prox   Full                                                             +---------+---------------+---------+-----------+----------+--------------+  FV Mid    Full                                                             +---------+---------------+---------+-----------+----------+--------------+  FV Distal Full                                                              +---------+---------------+---------+-----------+----------+--------------+  PFV       Full                                                             +---------+---------------+---------+-----------+----------+--------------+  POP       Full            Yes       Yes                                    +---------+---------------+---------+-----------+----------+--------------+  PTV       Full                                                             +---------+---------------+---------+-----------+----------+--------------+  PERO      Full                                                             +---------+---------------+---------+-----------+----------+--------------+     Summary: Right: There is no evidence of deep vein thrombosis in the lower extremity. No cystic structure found in the popliteal fossa. Left: There is no evidence of deep vein thrombosis in the lower extremity. No cystic structure found in the popliteal fossa.  *See table(s) above for measurements and observations. Electronically signed by Deitra Mayo MD on 06/27/2019 at 4:36:33 PM.    Final      ASSESSMENT & PLAN:  FARRA NIKOLIC is a 85 y.o. female with   1. Cancer of the central portion of left female breast, Stage IIB, T2 N1 invasive lobular carcinoma, grade 2, ER 95%, PR 97%, Ki-67 17%, HER-2/neu no amplification, diagnosed in 07/2010, local chest wall recurrence in 09/2015  -She wasdiagnosed in 10/2011but hadchest wall local recurrence in 09/2015.She was on adjuvant tamoxifen for 4.5 years, stopped on her own anddeveloped local recurrence 8 months after she stopped tamoxifen. -She has completed chest wall radiation, however developedlocal recurrence again andunderwent a second chest wall surgery for two soft tissue resection in 06/2016. Unfortunately the surgicalmargins were positive. I spoke with Dr. Marlou Starks, no more surgery  will be offered, because complete surgical resection is unlikely. Patient is also not in  favor of more extensive surgery. -she has been on fulvestrant injection on 8/9/18due to clinical suspicion for disease progress.She has been tolerating Faslodex injections well overall,her left chest wall pain has nearly resolved. -Due to possible anaphylactic reaction, she will not receive CT contrast with scans. -We discussed her PET from 04/04/19 which shows no evidence of recurrence or metastatic disease.  -She is clinically stable and doing well from a breast cancer standpoint. Labs reviewed, CBC and CMP WNL except 148K, BG 124, Protein 6.4. Ca 27.29 still pending. Her breast exam shows her soft tissue mass of left chest wall is smaller. Will proceed with Fulvestrant injection today and continue monthly  -I discussed Lenise will contact her before her grant for injections are to run out.  -F/u in 2 months, will order restaging PET on next visit   2. HTN, CAD  -She'll continue follow-up with her primary care physician and cardiologist -I previously encouraged her to monitor her BP at home and if remains elevated she should see her PCP. She is on Losartan.  3. Osteoporosis  -Her last DEXA scan in 06/2012 showed osteoporosis  -I previously encouraged her to continue calcium and Vit D -I previously encouraged her to repeat DEXA at her PCP office   4. Goal of care discussion  -The patient understands the goal of care is palliative. -she is full code for now   5. Recent UTI s/p course of macrobid, followed by nephrology  -She has been seen by Nephrologist   6. Swelling and tenderness of b/l posterior knees  -has been ongoing for the past month  -She has tenderness on exam there, pedal pulse is adequate -Will obtain b/l [place] of LE today to better evaluate. She is agreeable.    Plan -b/l [place] of LE today  -Labs reviewed, will proceed with Fulvestrant injection today - injection monthly  -Lab, f/u and injection in 2 months   No problem-specific Assessment & Plan notes found  for this encounter.   No orders of the defined types were placed in this encounter.  All questions were answered. The patient knows to call the clinic with any problems, questions or concerns. No barriers to learning was detected. I spent 20 minutes counseling the patient face to face. The total time spent in the appointment was 25 minutes and more than 50% was on counseling and review of test results     Truitt Merle, MD 06/27/2019   I, Joslyn Devon, am acting as scribe for Truitt Merle, MD.   I have reviewed the above documentation for accuracy and completeness, and I agree with the above.     Addendum Patient's ultrasound of lower extremities were negative for DVT.  Patient is aware of the results. I called her daughter Gloria [name] and updated her condition.  I recommend her to follow-up with her primary care physician for her back pain. She appreciated the call.  Truitt Merle  06/27/2019

## 2019-06-27 ENCOUNTER — Ambulatory Visit (HOSPITAL_COMMUNITY)
Admission: RE | Admit: 2019-06-27 | Discharge: 2019-06-27 | Disposition: A | Discharge status: Home / Self Care | Payer: PPO | Source: Ambulatory Visit | Attending: Hematology | Admitting: Hematology

## 2019-06-27 ENCOUNTER — Encounter: Payer: Self-pay | Admitting: Hematology

## 2019-06-27 ENCOUNTER — Inpatient Hospital Stay (HOSPITAL_BASED_OUTPATIENT_CLINIC_OR_DEPARTMENT_OTHER): Payer: PPO | Admitting: Hematology

## 2019-06-27 ENCOUNTER — Inpatient Hospital Stay: Payer: PPO | Attending: Hematology

## 2019-06-27 ENCOUNTER — Inpatient Hospital Stay: Payer: PPO

## 2019-06-27 ENCOUNTER — Other Ambulatory Visit: Payer: Self-pay

## 2019-06-27 VITALS — BP 148/72 | HR 86 | Temp 98.5°F | Resp 20 | Ht 60.0 in | Wt 134.0 lb

## 2019-06-27 DIAGNOSIS — Z170 Estrogen receptor positive status [ER+]: Secondary | ICD-10-CM | POA: Insufficient documentation

## 2019-06-27 DIAGNOSIS — C50012 Malignant neoplasm of nipple and areola, left female breast: Secondary | ICD-10-CM | POA: Insufficient documentation

## 2019-06-27 DIAGNOSIS — Z79818 Long term (current) use of other agents affecting estrogen receptors and estrogen levels: Secondary | ICD-10-CM | POA: Diagnosis not present

## 2019-06-27 DIAGNOSIS — I2510 Atherosclerotic heart disease of native coronary artery without angina pectoris: Secondary | ICD-10-CM | POA: Insufficient documentation

## 2019-06-27 DIAGNOSIS — Z9012 Acquired absence of left breast and nipple: Secondary | ICD-10-CM | POA: Diagnosis not present

## 2019-06-27 DIAGNOSIS — Z79899 Other long term (current) drug therapy: Secondary | ICD-10-CM | POA: Insufficient documentation

## 2019-06-27 DIAGNOSIS — K219 Gastro-esophageal reflux disease without esophagitis: Secondary | ICD-10-CM | POA: Diagnosis not present

## 2019-06-27 DIAGNOSIS — C50112 Malignant neoplasm of central portion of left female breast: Secondary | ICD-10-CM

## 2019-06-27 DIAGNOSIS — M810 Age-related osteoporosis without current pathological fracture: Secondary | ICD-10-CM | POA: Insufficient documentation

## 2019-06-27 DIAGNOSIS — F418 Other specified anxiety disorders: Secondary | ICD-10-CM | POA: Insufficient documentation

## 2019-06-27 DIAGNOSIS — Z923 Personal history of irradiation: Secondary | ICD-10-CM | POA: Insufficient documentation

## 2019-06-27 DIAGNOSIS — I10 Essential (primary) hypertension: Secondary | ICD-10-CM | POA: Diagnosis not present

## 2019-06-27 LAB — CBC WITH DIFFERENTIAL/PLATELET
Abs Immature Granulocytes: 0.02 10*3/uL (ref 0.00–0.07)
Basophils Absolute: 0 10*3/uL (ref 0.0–0.1)
Basophils Relative: 1 %
Eosinophils Absolute: 0.5 10*3/uL (ref 0.0–0.5)
Eosinophils Relative: 10 %
HCT: 39.4 % (ref 36.0–46.0)
Hemoglobin: 12.8 g/dL (ref 12.0–15.0)
Immature Granulocytes: 0 %
Lymphocytes Relative: 19 %
Lymphs Abs: 0.9 10*3/uL (ref 0.7–4.0)
MCH: 31.4 pg (ref 26.0–34.0)
MCHC: 32.5 g/dL (ref 30.0–36.0)
MCV: 96.6 fL (ref 80.0–100.0)
Monocytes Absolute: 0.3 10*3/uL (ref 0.1–1.0)
Monocytes Relative: 7 %
Neutro Abs: 2.9 10*3/uL (ref 1.7–7.7)
Neutrophils Relative %: 63 %
Platelets: 148 10*3/uL — ABNORMAL LOW (ref 150–400)
RBC: 4.08 MIL/uL (ref 3.87–5.11)
RDW: 13 % (ref 11.5–15.5)
WBC: 4.6 10*3/uL (ref 4.0–10.5)
nRBC: 0 % (ref 0.0–0.2)

## 2019-06-27 LAB — COMPREHENSIVE METABOLIC PANEL
ALT: 13 U/L (ref 0–44)
AST: 20 U/L (ref 15–41)
Albumin: 3.7 g/dL (ref 3.5–5.0)
Alkaline Phosphatase: 87 U/L (ref 38–126)
Anion gap: 10 (ref 5–15)
BUN: 15 mg/dL (ref 8–23)
CO2: 26 mmol/L (ref 22–32)
Calcium: 9 mg/dL (ref 8.9–10.3)
Chloride: 106 mmol/L (ref 98–111)
Creatinine, Ser: 0.89 mg/dL (ref 0.44–1.00)
GFR calc Af Amer: >60 mL/min (ref >60)
GFR calc non Af Amer: 57 mL/min — ABNORMAL LOW (ref >60)
Glucose, Bld: 124 mg/dL — ABNORMAL HIGH (ref 70–99)
Potassium: 3.8 mmol/L (ref 3.5–5.1)
Sodium: 142 mmol/L (ref 135–145)
Total Bilirubin: 0.4 mg/dL (ref 0.3–1.2)
Total Protein: 6.4 g/dL — ABNORMAL LOW (ref 6.5–8.1)

## 2019-06-27 MED ORDER — FULVESTRANT 250 MG/5ML IM SOLN
500.0000 mg | INTRAMUSCULAR | Status: DC
  Administered 2019-06-27: 500 mg via INTRAMUSCULAR

## 2019-06-27 MED ORDER — FULVESTRANT 250 MG/5ML IM SOLN
INTRAMUSCULAR | Status: AC
  Filled 2019-06-27: qty 5

## 2019-06-27 NOTE — Patient Instructions (Signed)
Fulvestrant injection What is this medicine? FULVESTRANT (ful VES trant) blocks the effects of estrogen. It is used to treat breast cancer. This medicine may be used for other purposes; ask your health care provider or pharmacist if you have questions. COMMON BRAND NAME(S): FASLODEX What should I tell my health care provider before I take this medicine? They need to know if you have any of these conditions:  bleeding disorders  liver disease  low blood counts, like low white cell, platelet, or red cell counts  an unusual or allergic reaction to fulvestrant, other medicines, foods, dyes, or preservatives  pregnant or trying to get pregnant  breast-feeding How should I use this medicine? This medicine is for injection into a muscle. It is usually given by a health care professional in a hospital or clinic setting. Talk to your pediatrician regarding the use of this medicine in children. Special care may be needed. Overdosage: If you think you have taken too much of this medicine contact a poison control center or emergency room at once. NOTE: This medicine is only for you. Do not share this medicine with others. What if I miss a dose? It is important not to miss your dose. Call your doctor or health care professional if you are unable to keep an appointment. What may interact with this medicine?  medicines that treat or prevent blood clots like warfarin, enoxaparin, dalteparin, apixaban, dabigatran, and rivaroxaban This list may not describe all possible interactions. Give your health care provider a list of all the medicines, herbs, non-prescription drugs, or dietary supplements you use. Also tell them if you smoke, drink alcohol, or use illegal drugs. Some items may interact with your medicine. What should I watch for while using this medicine? Your condition will be monitored carefully while you are receiving this medicine. You will need important blood work done while you are taking  this medicine. Do not become pregnant while taking this medicine or for at least 1 year after stopping it. Women of child-bearing potential will need to have a negative pregnancy test before starting this medicine. Women should inform their doctor if they wish to become pregnant or think they might be pregnant. There is a potential for serious side effects to an unborn child. Men should inform their doctors if they wish to father a child. This medicine may lower sperm counts. Talk to your health care professional or pharmacist for more information. Do not breast-feed an infant while taking this medicine or for 1 year after the last dose. What side effects may I notice from receiving this medicine? Side effects that you should report to your doctor or health care professional as soon as possible:  allergic reactions like skin rash, itching or hives, swelling of the face, lips, or tongue  feeling faint or lightheaded, falls  pain, tingling, numbness, or weakness in the legs  signs and symptoms of infection like fever or chills; cough; flu-like symptoms; sore throat  vaginal bleeding Side effects that usually do not require medical attention (report to your doctor or health care professional if they continue or are bothersome):  aches, pains  constipation  diarrhea  headache  hot flashes  nausea, vomiting  pain at site where injected  stomach pain This list may not describe all possible side effects. Call your doctor for medical advice about side effects. You may report side effects to FDA at 1-800-FDA-1088. Where should I keep my medicine? This drug is given in a hospital or clinic and will   not be stored at home. NOTE: This sheet is a summary. It may not cover all possible information. If you have questions about this medicine, talk to your doctor, pharmacist, or health care provider.  2020 Elsevier/Gold Standard (2018-01-24 11:34:41)  

## 2019-06-27 NOTE — Progress Notes (Signed)
Bilateral lower extremity venous duplex has been completed. Preliminary results can be found in CV Proc through chart review.  Results were given to [name].  06/27/19 3:42 PM Carlos Levering RVT

## 2019-06-28 LAB — CANCER ANTIGEN 27.29: CA 27.29: 32.3 U/mL (ref 0.0–38.6)

## 2019-06-30 ENCOUNTER — Telehealth: Payer: Self-pay | Admitting: Hematology

## 2019-06-30 NOTE — Telephone Encounter (Signed)
Scheduled appt per 8/28 los. ° °Spoke with patient and she is aware of her appt date and time. °

## 2019-07-15 ENCOUNTER — Encounter: Payer: Self-pay | Admitting: Hematology

## 2019-07-18 ENCOUNTER — Telehealth: Payer: Self-pay

## 2019-07-18 NOTE — Telephone Encounter (Signed)
Patient had called and left a voicemail asking [name] for a "certificate or letter saying that I am of sound mind" because she would not be able to see her PCP until February 2021 and needed to make changes to her will. Spoke with [name] who stated that being the patient's oncologist she does not feel it's appropriate for her to make that assessment, and patient should follow up with her PCP because they will need to do a full in-take and assessment.   Returned patient's call with above information. Patient stated she sees [name] more frequently than her PCP but if that is [name] stance, she understands and accepts it. Patient thank RN for call and denied any other needs at this time.

## 2019-07-28 ENCOUNTER — Telehealth: Payer: Self-pay

## 2019-07-28 ENCOUNTER — Other Ambulatory Visit: Payer: Self-pay

## 2019-07-28 ENCOUNTER — Inpatient Hospital Stay: Payer: PPO | Attending: Hematology

## 2019-07-28 VITALS — BP 153/81 | HR 67 | Temp 99.1°F | Resp 16

## 2019-07-28 DIAGNOSIS — Z79818 Long term (current) use of other agents affecting estrogen receptors and estrogen levels: Secondary | ICD-10-CM | POA: Diagnosis not present

## 2019-07-28 DIAGNOSIS — Z170 Estrogen receptor positive status [ER+]: Secondary | ICD-10-CM | POA: Diagnosis not present

## 2019-07-28 DIAGNOSIS — C50112 Malignant neoplasm of central portion of left female breast: Secondary | ICD-10-CM | POA: Insufficient documentation

## 2019-07-28 MED ORDER — FULVESTRANT 250 MG/5ML IM SOLN
INTRAMUSCULAR | Status: AC
  Filled 2019-07-28: qty 5

## 2019-07-28 MED ORDER — FULVESTRANT 250 MG/5ML IM SOLN
500.0000 mg | INTRAMUSCULAR | Status: DC
  Administered 2019-07-28: 12:00:00 500 mg via INTRAMUSCULAR

## 2019-07-28 NOTE — Telephone Encounter (Signed)
Patient had come in for injection this morning and told the flush nurse that she had been having some issues she wanted to speak to the nurse about prior to her visit in October. Lower extremity swelling x 3 weeks and elevated bp.  Attempted to call patient at home number call would not go through.  Called cell number no answer and her voice mail box is full.  Will try again later.

## 2019-07-28 NOTE — Telephone Encounter (Signed)
Spoke with patient again regarding lower extremity swelling.  Instructed to elevate her legs every time sitting for any period of time.  Also get compression stockings and wear which will help with circulation and hopefully decrease his swelling.  Patient verbalized an understanding.  She will also mention this to her PCP when she sees in October.

## 2019-07-28 NOTE — Telephone Encounter (Signed)
Gloria [name], If you have told her to elevate her legs when sits, and try compression stocks, that's adequate. Thanks   Truitt Merle MD

## 2019-07-28 NOTE — Telephone Encounter (Signed)
Patient was seen in flush today for injection and she expressed concern over her bilateral lower extremity swelling which starts behind her knees and down to the ankles on both legs.  This is the same symptoms she was having when she was sent for bilateral LE dopplers on 06/27/2019 by [name].  Which were negative.  The patient states the pain is a little more than before, swelling is the same.  She is using a walker when she ambulates.  She has not tried propping up her lower extremities when sitting, nor compression stockings.  I explained I would speak with [name] and get back to her.  She has an appointment with Dr. Felipa Eth on 07/10/2019.

## 2019-07-28 NOTE — Patient Instructions (Signed)
Fulvestrant injection What is this medicine? FULVESTRANT (ful VES trant) blocks the effects of estrogen. It is used to treat breast cancer. This medicine may be used for other purposes; ask your health care provider or pharmacist if you have questions. COMMON BRAND NAME(S): FASLODEX What should I tell my health care provider before I take this medicine? They need to know if you have any of these conditions:  bleeding disorders  liver disease  low blood counts, like low white cell, platelet, or red cell counts  an unusual or allergic reaction to fulvestrant, other medicines, foods, dyes, or preservatives  pregnant or trying to get pregnant  breast-feeding How should I use this medicine? This medicine is for injection into a muscle. It is usually given by a health care professional in a hospital or clinic setting. Talk to your pediatrician regarding the use of this medicine in children. Special care may be needed. Overdosage: If you think you have taken too much of this medicine contact a poison control center or emergency room at once. NOTE: This medicine is only for you. Do not share this medicine with others. What if I miss a dose? It is important not to miss your dose. Call your doctor or health care professional if you are unable to keep an appointment. What may interact with this medicine?  medicines that treat or prevent blood clots like warfarin, enoxaparin, dalteparin, apixaban, dabigatran, and rivaroxaban This list may not describe all possible interactions. Give your health care provider a list of all the medicines, herbs, non-prescription drugs, or dietary supplements you use. Also tell them if you smoke, drink alcohol, or use illegal drugs. Some items may interact with your medicine. What should I watch for while using this medicine? Your condition will be monitored carefully while you are receiving this medicine. You will need important blood work done while you are taking  this medicine. Do not become pregnant while taking this medicine or for at least 1 year after stopping it. Women of child-bearing potential will need to have a negative pregnancy test before starting this medicine. Women should inform their doctor if they wish to become pregnant or think they might be pregnant. There is a potential for serious side effects to an unborn child. Men should inform their doctors if they wish to father a child. This medicine may lower sperm counts. Talk to your health care professional or pharmacist for more information. Do not breast-feed an infant while taking this medicine or for 1 year after the last dose. What side effects may I notice from receiving this medicine? Side effects that you should report to your doctor or health care professional as soon as possible:  allergic reactions like skin rash, itching or hives, swelling of the face, lips, or tongue  feeling faint or lightheaded, falls  pain, tingling, numbness, or weakness in the legs  signs and symptoms of infection like fever or chills; cough; flu-like symptoms; sore throat  vaginal bleeding Side effects that usually do not require medical attention (report to your doctor or health care professional if they continue or are bothersome):  aches, pains  constipation  diarrhea  headache  hot flashes  nausea, vomiting  pain at site where injected  stomach pain This list may not describe all possible side effects. Call your doctor for medical advice about side effects. You may report side effects to FDA at 1-800-FDA-1088. Where should I keep my medicine? This drug is given in a hospital or clinic and will   not be stored at home. NOTE: This sheet is a summary. It may not cover all possible information. If you have questions about this medicine, talk to your doctor, pharmacist, or health care provider.  2020 Elsevier/Gold Standard (2018-01-24 11:34:41)  

## 2019-08-08 DIAGNOSIS — J449 Chronic obstructive pulmonary disease, unspecified: Secondary | ICD-10-CM | POA: Diagnosis not present

## 2019-08-08 DIAGNOSIS — I10 Essential (primary) hypertension: Secondary | ICD-10-CM | POA: Diagnosis not present

## 2019-08-08 DIAGNOSIS — Z0000 Encounter for general adult medical examination without abnormal findings: Secondary | ICD-10-CM | POA: Diagnosis not present

## 2019-08-13 ENCOUNTER — Telehealth: Payer: Self-pay | Admitting: Cardiovascular Disease

## 2019-08-13 ENCOUNTER — Other Ambulatory Visit: Payer: Self-pay | Admitting: Nurse Practitioner

## 2019-08-13 DIAGNOSIS — C50112 Malignant neoplasm of central portion of left female breast: Secondary | ICD-10-CM

## 2019-08-13 DIAGNOSIS — Z170 Estrogen receptor positive status [ER+]: Secondary | ICD-10-CM

## 2019-08-13 NOTE — Telephone Encounter (Signed)
Spoke to pt's daughter. She was not currently with the pt, but had her on another line talking. The pt is very hard of hearing so the daughter helps communicate. She states that the pt's PCP just put her on amlodipine with her losartan and she is having leg swelling and pain. The daughter does not think she should be on both medications. She says that the pt is up two pounds but is not SOB. Advised daughter to go get a current BP and weight and get them again in the AM. Set up an appt with Jory Sims for 8am tomorrow morning and advised the daughter to make sure she gets the vital signs and weight and to be present for phone call. Advised if the pt gets SOB and the BP is over [id number] systolic ans symptomatic to call 911.

## 2019-08-13 NOTE — Telephone Encounter (Signed)
New message  Pt c/o BP issue: STAT if pt c/o blurred vision, one-sided weakness or slurred speech  1. What are your last 5 BP readings?patient's daughter does not have a reading, she states that her b/p continues to rise   2. Are you having any other symptoms (ex. Dizziness, headache, blurred vision, passed out)? No   3. What is your BP issue?b/p is elevated.     Pt c/o swelling: STAT is pt has developed SOB within 24 hours  1) How much weight have you gained and in what time span? No   2) If swelling, where is the swelling located? Back of knees going up her leg   3) Are you currently taking a fluid pill?no   4) Are you currently SOB? No   5) Do you have a log of your daily weights (if so, list)? No   6) Have you gained 3 pounds in a day or 5 pounds in a week? No   7) Have you traveled recently? Patient's daughter states that travel beach for 3 days

## 2019-08-14 ENCOUNTER — Other Ambulatory Visit: Payer: Self-pay

## 2019-08-14 ENCOUNTER — Ambulatory Visit (INDEPENDENT_AMBULATORY_CARE_PROVIDER_SITE_OTHER): Payer: PPO | Admitting: Adult Health

## 2019-08-14 ENCOUNTER — Other Ambulatory Visit: Payer: Self-pay | Admitting: Nurse Practitioner

## 2019-08-14 ENCOUNTER — Encounter: Payer: Self-pay | Admitting: Adult Health

## 2019-08-14 VITALS — BP 170/100

## 2019-08-14 DIAGNOSIS — R609 Edema, unspecified: Secondary | ICD-10-CM | POA: Diagnosis not present

## 2019-08-14 DIAGNOSIS — I10 Essential (primary) hypertension: Secondary | ICD-10-CM | POA: Diagnosis not present

## 2019-08-14 DIAGNOSIS — I2510 Atherosclerotic heart disease of native coronary artery without angina pectoris: Secondary | ICD-10-CM

## 2019-08-14 DIAGNOSIS — Z170 Estrogen receptor positive status [ER+]: Secondary | ICD-10-CM

## 2019-08-14 DIAGNOSIS — C50112 Malignant neoplasm of central portion of left female breast: Secondary | ICD-10-CM

## 2019-08-14 MED ORDER — ALPRAZOLAM 0.25 MG PO TABS: ORAL_TABLET | ORAL | 1 refills | Status: DC

## 2019-08-14 MED ORDER — HYDROCHLOROTHIAZIDE 12.5 MG PO CAPS: 12.5000 mg | ORAL_CAPSULE | Freq: Every day | ORAL | 3 refills | Status: DC

## 2019-08-14 MED ORDER — LOSARTAN POTASSIUM 50 MG PO TABS: 50.0000 mg | ORAL_TABLET | Freq: Every day | ORAL | 2 refills | Status: DC

## 2019-08-14 NOTE — Telephone Encounter (Signed)
Agree with follow-up office visit

## 2019-08-14 NOTE — Progress Notes (Signed)
Virtual Visit via Video Note   This visit type was conducted due to national recommendations for restrictions regarding the COVID-19 Pandemic (e.g. social distancing) in an effort to limit this patient's exposure and mitigate transmission in our community.  Due to her co-morbid illnesses, this patient is at least at moderate risk for complications without adequate follow up.  This format is felt to be most appropriate for this patient at this time.  All issues noted in this document were discussed and addressed.  A limited physical exam was performed with this format.  Please refer to the patient's chart for her consent to telehealth for [facility].   Date:  08/14/2019   ID:  Gloria [name], DOB Jun 15, 1928, MRN [id number]  Patient Location: Home Provider Location: Home  PCP:  Lajean Manes, MD  Cardiologist:  Dr. Claiborne Billings  Electrophysiologist:  None   Evaluation Performed:  Follow-Up Visit  Chief Complaint:  Hypertension   History of Present Illness:    Gloria [name] is a 85 y.o. female we are following for ongoing assessment and management of coronary artery disease, history of LAD, diagonal, and right coronary artery stenosis by cath in 2012, history of hypertension, breast cancer, and GERD.  She is status post left mastectomy with radiation therapy and had been on chronic tamoxifen therapy in the past.  Other history includes anxiety and depression.  She was last seen in the office by Dr. Claiborne Billings on 03/15/2016, at which time she had been on losartan 50 mg for hypertension and was continued on aspirin therapy.  Patient had complained of some chest tenderness and soreness, especially on the left side, which Dr. Claiborne Billings believed was related to radiation-induced soreness.  She was scheduled for a repeat echocardiogram to evaluate her heart function.  She was to follow-up with her primary care physician thereafter.  Echocardiogram completed on 08/22/2016 revealed normal LV systolic function  with an EF of 60 to 65%.  She did have grade 1 diastolic dysfunction.  Her left atrium was mildly dilated.  She had a lower extremity Doppler ultrasound completed on 06/27/2019 due to lower extremity discomfort and edema.  There was no evidence of deep vein thrombosis bilaterally.  Gloria [name]'s daughter called [place] on 08/13/2019 with patient complains of elevated blood pressure.  She stated that her mother's PCP just started her on amlodipine along with her losartan and she was having lower extremity edema and pain.  Her daughter did not think that she needed to be on both medications, as she was having the side effects with a 2 pound weight gain.  She is here to discuss her blood pressure control and medication regimen.  She is seen today with her daughter in attendance as she is very hard of hearing.  She is gained approximately 5 pounds and has some lower extremity edema, she is noticing it mostly behind her knees bilaterally and into her feet.  She does wear support hose.  Blood pressure is elevated today at 170/100.  She has not taken amlodipine for the last 24 hours as she stated the swelling was worse on that medication.  She is taking losartan 25 mg daily.  She has taking her medication this morning.  The patient does not have symptoms concerning for COVID-19 infection (fever, chills, cough, or new shortness of breath).    Past Medical History:  Diagnosis Date  . Anxiety   . Blood in urine   . CAD (coronary artery disease)    non obstructive  by cath  . Cancer [facility])    breast- left  . Depression   . GERD (gastroesophageal reflux disease)   . Hypertension   . Knee fracture   . Personal history of radiation therapy 2017  . TR (tricuspid regurgitation)    mild by Echo 12/2008 EF >55%   Past Surgical History:  Procedure Laterality Date  . ABDOMINAL AORTAGRAM N/A 09/08/2011   Procedure: ABDOMINAL Maxcine Ham;  Surgeon: Troy Sine, MD;  Location: [facility] CATH LAB;  Service: Cardiovascular;   Laterality: N/A;  . ABDOMINAL HYSTERECTOMY    . BREAST EXCISIONAL BIOPSY Left 2017   X3  . BREAST SURGERY  08-30-10   mastectomy  . CARDIAC CATHETERIZATION  08/2011   20% LAD stenosis, 20% diagonal stenosis, 10-20% proximal dominant RCA stenosis  . COLON SURGERY    . LEFT HEART CATHETERIZATION WITH CORONARY ANGIOGRAM N/A 09/08/2011   Procedure: LEFT HEART CATHETERIZATION WITH CORONARY ANGIOGRAM;  Surgeon: Troy Sine, MD;  Location: [facility] CATH LAB;  Service: Cardiovascular;  Laterality: N/A;  . MASTECTOMY    . MINOR BREAST BIOPSY Left 11/25/2015   Procedure: MINOR EXCISION MASS LEFT CHEST WALL;  Surgeon: Autumn Messing III, MD;  Location: [place];  Service: General;  Laterality: Left;  . MINOR BREAST BIOPSY Left 07/17/2016   Procedure: EXCISION OF 2 LEFT CHEST WALL MASSES;  Surgeon: Autumn Messing III, MD;  Location: [place];  Service: General;  Laterality: Left;  . TONSILLECTOMY       No outpatient medications have been marked as taking for the 08/14/19 encounter (Appointment) with Lendon Colonel, NP.     Allergies:   Contrast media [iodinated diagnostic agents], Penicillins, Adhesive [tape], Cephalexin, Ciprofloxacin, Demerol, Sulfamethoxazole-trimethoprim, and Quinolones   Social History   Tobacco Use  . Smoking status: Former Smoker    Quit date: 11/15/1991    Years since quitting: 27.7  . Smokeless tobacco: Never Used  Substance Use Topics  . Alcohol use: Yes    Alcohol/week: 2.0 standard drinks    Types: 2 Glasses of wine per week    Comment: 1 glass per week  . Drug use: No     Family Hx: The patient's family history includes Arthritis in her sister; Breast cancer in her maternal aunt and sister; Cancer in her brother, father, maternal grandmother, and sister; Dementia in her mother; Heart attack in her brother and brother; Heart disease in her sister; Sudden death in her brother.  ROS:   Please see the history of present illness.     All  other systems reviewed and are negative.   Prior CV studies:   The following studies were reviewed today:  Echocardiogram 08/22/2016 Left ventricle: The cavity size was normal. Wall thickness was   normal. Systolic function was normal. The estimated ejection   fraction was in the range of 60% to 65%. Wall motion was normal;   there were no regional wall motion abnormalities. Doppler   parameters are consistent with abnormal left ventricular   relaxation (grade 1 diastolic dysfunction). - Left atrium: The atrium was mildly dilated.  Labs/Other Tests and Data Reviewed:    EKG:  No ECG reviewed.  Recent Labs: 06/27/2019: ALT 13; BUN 15; Creatinine, Ser 0.89; Hemoglobin 12.8; Platelets 148; Potassium 3.8; Sodium 142   Recent Lipid Panel Lab Results  Component Value Date/Time   CHOL  11/20/2007 05:05 AM    129        ATP III CLASSIFICATION:  <200  mg/dL   Desirable  200-239  mg/dL   Borderline High  >=240    mg/dL   High   TRIG 78 11/20/2007 05:05 AM   HDL 62 11/20/2007 05:05 AM   CHOLHDL 2.1 11/20/2007 05:05 AM   LDLCALC  11/20/2007 05:05 AM    51        Total Cholesterol/HDL:CHD Risk Coronary Heart Disease Risk Table                     Men   Women  1/2 Average Risk   3.4   3.3    Wt Readings from Last 3 Encounters:  06/27/19 134 lb (60.8 kg)  04/28/19 131 lb 1.6 oz (59.5 kg)  02/27/19 132 lb 11.2 oz (60.2 kg)     Objective:    Vital Signs:  There were no vitals taken for this visit.   VITAL SIGNS:  reviewed GEN:  no acute distress RESPIRATORY:  normal respiratory effort, symmetric expansion CARDIOVASCULAR:  lower extremity edema noted MUSCULOSKELETAL:  no obvious deformities. NEURO:  alert and oriented x 3, no obvious focal deficit PSYCH:  normal affect  ASSESSMENT & PLAN:    1. Uncontrolled Hypertension: Patient's blood pressure is not well controlled on current dose of losartan 25 mg daily.  She had amlodipine 5 mg added to her medication regimen which  caused her to have significant lower extremity edema.  She stopped taking it.  I am going to increase her losartan to 50 mg daily and add low-dose HCTZ 12.5 mg to her medication regimen.  Right now she has just picked up a new bottle of losartan 25 mg daily.  I told her to take 2 of those tablets daily and then take an additional HCTZ 12.5 mg daily (new prescription has been sent in).  She is to take her blood pressure every day at the same time every day at least 1 hour after taking her medication.  I have advised her that she may notice that her urination increases for short period of time.  She is to call us in 3 days and let us know how her blood pressure, edema, and weight is doing.  I have advised her on a low-sodium diet.  I will see her back in 1 week virtually to evaluate how she has responded to her medications.  However if she is not feeling well, blood pressures not well controlled, or edema has worsened, on phone call on Monday; she will need to be seen in person.  She verbalizes understanding.  I would like to get a follow-up BMET once we see her again.IF her BP drops below 130/60 she is to call.   2.  Coronary artery disease: Nonobstructive.  No complaints of chest pressure or dyspnea.  Continue current medication regimen.  COVID-19 Education: The signs and symptoms of COVID-19 were discussed with the patient and how to seek care for testing (follow up with PCP or arrange E-visit).  The importance of social distancing was discussed today.  Time:   Today, I have spent 20 minutes with the patient with telehealth technology discussing the above problems.     Medication Adjustments/Labs and Tests Ordered: Current medicines are reviewed at length with the patient today.  Concerns regarding medicines are outlined above.   Tests Ordered: No orders of the defined types were placed in this encounter.   Medication Changes: Increase losartan to 50 mg daily. Add HCTZ 12.5 mg daily.    Disposition:  Follow  up one week  Signed, Phill Myron. West Pugh, ANP, AACC  08/14/2019 7:42 AM    [facility]

## 2019-08-14 NOTE — Addendum Note (Signed)
Addended by: Vennie Homans on: 08/14/2019 09:01 AM   Modules accepted: Orders

## 2019-08-14 NOTE — Patient Instructions (Addendum)
Medication Instructions:  INCREASE- Losartan 50 mg daily START- Hydrochlorothiazide 12.5 mg daily  If you need a refill on your cardiac medications before your next appointment, please call your pharmacy.  Labwork: None Ordered  Testing/Procedures: None Ordered  Follow-Up: . Your physician recommends that you schedule a follow-up appointment in: Virtual Visit on October 22nd at 8:00 am   At [facility], you and your health needs are our priority.  As part of our continuing mission to provide you with exceptional heart care, we have created designated Provider Care Teams.  These Care Teams include your primary Cardiologist (physician) and Advanced Practice Providers (APPs -  Physician Assistants and Nurse Practitioners) who all work together to provide you with the care you need, when you need it.  Thank you for choosing [facility] at [facility]!!

## 2019-08-18 NOTE — Progress Notes (Signed)
[name]   [phone] [phone]   Clinic Follow up Note   Patient Care Team: Lajean Manes, MD as PCP - Iola, Howell, MD as Consulting Physician (General Surgery) Truitt Merle, MD as Consulting Physician (Hematology) [name], MD as Consulting Physician (Radiation Oncology) Sylvan Cheese, NP as Nurse Practitioner (Hematology and Oncology) Karen Kays, NP as Nurse Practitioner (Nurse Practitioner)  Date of Service:  08/27/2019  CHIEF COMPLAINT: F/uinvasive lobular carcinoma of the left breast  SUMMARY OF ONCOLOGIC HISTORY: Oncology History Overview Note  Cancer of central portion of left female breast [facility])   Staging form: Breast, AJCC 7th Edition     Pathologic stage from 08/04/2010: Stage IIB (T2, N1a, cM0) - Signed by Truitt Merle, MD on 12/09/2015     Cancer of central portion of left female breast ([place])  08/03/2010 Mammogram    left nipple retraction, and the palpable mass subareolar left breast at the 6:00 position. Ultrasound confirmed the presence of a mass measuring 2.6 x 1.7 x 2.4 cm   08/04/2010 Initial Biopsy   left breast mass biopsy showed an invasive mammary carcinoma with lobular features. ER 95%, PR 97%, Ki-67 17%, HER-2/neu (-)   08/04/2010 Pathologic Stage   Stage IIB: T2 N1a   08/11/2010 Imaging   left breast mass 4.6 x 4.2 x 2.3 cm with enhancement distortion extending to the nipple retraction. There is questionable anterior mediastinal lymph node seen   07/2010 - 09/03/2010 Anti-estrogen oral therapy   neoadjuvant letrozole    09/03/2010 Surgery    left breast mastectomy with sentinel node biopsy on 09/03/2010.   08/2010 - 01/2015 Anti-estrogen oral therapy   Tamoxifen, pt stopped on her own    12/03/2010 - 01/23/2011 Radiation Therapy   left chest wall and axilla radiaiton    10/19/2015 Progression   Left chest wall recurrence, s/p resection on 11/25/2015 with positive margins ([facility])    12/10/2015 - 05/31/2017 Anti-estrogen oral therapy   anastrozole 54m daily, which was switched to Tamoxifen on 06/30/2016 due to persistent disease.   01/03/2016 - 03/03/2016 Radiation Therapy   Radiation to left chest wall recurrence (12/06/2015-02/04/2016 - 44 Gy), pt progressed through the radiation, and had additional 10 fractions of radiation (02/21/16-03/03/2016 - total 70 Gy to the left chest wall)   07/14/2016 PET scan   PET 07/14/2016 IMPRESSION: 1. No findings of recurrent malignancy. 2. Radiation fibrosis in the lingula. A small left axillary lymph node is not hypermetabolic and only 6 mm in diameter. This is in the vicinity of the prior axillary cyst. 3. Pelvic floor laxity.   07/17/2016 Surgery   Chest wall soft tissue resection for local recurrence   07/17/2016 Pathology Results   Left chest wall two soft tissue resection both showed invasive lobular carcinoma, margins were positive. ER 95% positive, PR 60% positive, HER-2 negative.   07/27/2016 Imaging   CT chest, abdomen and pelvis wo contrast  07/27/2016 IMPRESSION: Sigmoid diverticulosis without acute diverticulitis. No bowel obstruction or acute inflammation. Degenerative disc disease and facet arthropathy of the lower lumbar spine with slight grade 1 anterolisthesis of L4 on L5. Stable appearing simple left-sided renal cysts.   03/01/2017 Mammogram   Mammogram 03/01/2017 IMPRESSION: No mammographic evidence of malignancy. A result letter of this screening mammogram will be mailed directly to the patient. RECOMMENDATION: Screening mammogram in one year   04/10/2017 Imaging   PET IMPRESSION: 1. Stable exam.  No findings of recurrent malignancy. 2. Changes of external  beam radiation again noted within the lingula. 3. Small left axillary lymph node exhibits mild increased uptake. Unchanged from previous exam.   06/07/2017 -  Anti-estrogen oral therapy   Stopped Tamoxifen and Began Faslodex IM injection on 06/07/17    09/19/2017  Imaging   CT CAP WO Contrast 09/19/17 IMPRESSION: 1. Surgical changes from a left mastectomy without CT findings to suggest recurrent chest wall tumor or axillary lymphadenopathy. 2. No findings to suggest metastatic disease involving the neck, chest, abdomen or pelvis or osseous structures. 3. Stable radiation changes involving the lingula.    09/19/2017 Imaging   Bone Scan Whole Body  IMPRESSION: No definite scintigraphic evidence of osseous metastatic disease. Scattered degenerative type uptake as above.   03/11/2018 PET scan   IMPRESSION:  1. No evidence of recurrent disease. 2.  Aortic atherosclerosis    09/03/2018 PET scan   09/03/2018 PET Scan IMPRESSION: 1. No findings to suggest locally recurrent disease or metastatic disease in the neck, chest, abdomen or pelvis. 2. Hepatic steatosis. 3. Colonic diverticulosis without evidence of acute diverticulitis at this time. 4. Aortic atherosclerosis. 5. Additional incidental findings, as above.   11/25/2018 Imaging   DG Ribs Unilateral W/Chest Left  IMPRESSION: Stable left lingular scarring. Normal left ribs. No acute cardiopulmonary abnormality seen.    04/04/2019 PET scan   PET  IMPRESSION: 1. No evidence of hypermetabolic recurrent or metastatic disease. 2. Hepatic steatosis. 3.  Aortic Atherosclerosis (ICD10-I70.0).      CURRENT THERAPY:  monthly Faslodex injection started on 06/07/17  INTERVAL HISTORY:  Gloria [name] is here for a follow up of left breast cancer. She presents to the clinic alone. She notes she has a recent open sore of upper lateral left leg. She went to [facility] ED for evaluation and drainage. She notes she has significant pain from this. She has been taking 1063m of extra strength tylenol. She will see Dermatologist tomorrow. She also notes still having left lateral chest wall pain. She also has some SOB which she related to her pain. She notes she is still able to adequately take care of herself.  She feels like her pain does not stop her from what she needs to do but less.    REVIEW OF SYSTEMS:   Constitutional: Denies fevers, chills or abnormal weight loss Eyes: Denies blurriness of vision Ears, nose, mouth, throat, and face: Denies mucositis or sore throat Respiratory: Denies cough or wheezes (+) Left lateral chest wall pain (+) Mild SOB  Cardiovascular: Denies palpitation, chest discomfort or lower extremity swelling Gastrointestinal:  Denies nausea, heartburn or change in bowel habits Skin: Denies abnormal skin rashes (+) Left lateral upper leg sore, healing MSK: (+) B/L LE pain  Lymphatics: Denies new lymphadenopathy or easy bruising Neurological:Denies numbness, tingling or new weaknesses Behavioral/Psych: Mood is stable, no new changes  All other systems were reviewed with the patient and are negative.  MEDICAL HISTORY:  Past Medical History:  Diagnosis Date   Anxiety    Blood in urine    CAD (coronary artery disease)    non obstructive by cath   Cancer ([facility]    breast- left   Depression    GERD (gastroesophageal reflux disease)    Hypertension    Knee fracture    Personal history of radiation therapy 2017   TR (tricuspid regurgitation)    mild by Echo 12/2008 EF >55%    SURGICAL HISTORY: Past Surgical History:  Procedure Laterality Date   ABDOMINAL AORTAGRAM N/A 09/08/2011  Procedure: ABDOMINAL Maxcine Ham;  Surgeon: Troy Sine, MD;  Location: [facility] CATH LAB;  Service: Cardiovascular;  Laterality: N/A;   ABDOMINAL HYSTERECTOMY     BREAST EXCISIONAL BIOPSY Left 2017   X3   BREAST SURGERY  08-30-10   mastectomy   CARDIAC CATHETERIZATION  08/2011   20% LAD stenosis, 20% diagonal stenosis, 10-20% proximal dominant RCA stenosis   COLON SURGERY     LEFT HEART CATHETERIZATION WITH CORONARY ANGIOGRAM N/A 09/08/2011   Procedure: LEFT HEART CATHETERIZATION WITH CORONARY ANGIOGRAM;  Surgeon: Troy Sine, MD;  Location: [facility] CATH LAB;  Service:  Cardiovascular;  Laterality: N/A;   MASTECTOMY     MINOR BREAST BIOPSY Left 11/25/2015   Procedure: MINOR EXCISION MASS LEFT CHEST WALL;  Surgeon: Autumn Messing III, MD;  Location: [place];  Service: General;  Laterality: Left;   MINOR BREAST BIOPSY Left 07/17/2016   Procedure: EXCISION OF 2 LEFT CHEST WALL MASSES;  Surgeon: Autumn Messing III, MD;  Location: [place];  Service: General;  Laterality: Left;   TONSILLECTOMY      I have reviewed the social history and family history with the patient and they are unchanged from previous note.  ALLERGIES:  is allergic to contrast media [iodinated diagnostic agents]; penicillins; adhesive [tape]; cephalexin; ciprofloxacin; demerol; sulfamethoxazole-trimethoprim; and quinolones.  MEDICATIONS:  Current Outpatient Medications  Medication Sig Dispense Refill   acetaminophen (TYLENOL) 500 MG tablet Take 1,000 mg by mouth daily as needed for moderate pain. Reported on 03/10/2016     ALPRAZolam (XANAX) 0.25 MG tablet Take 1/2 tablet by mouth in the morning and 1 tab at bedtime as needed 45 tablet 1   aspirin 81 MG chewable tablet Chew 81 mg by mouth once a week. Reported on 03/10/2016     azithromycin (ZITHROMAX) 250 MG tablet Take one tablet by mouth daily 7 each 0   CALCIUM-MAGNESIUM-VITAMIN D PO Take 1 tablet by mouth 2 (two) times daily.     cetirizine (ZYRTEC) 10 MG tablet Take 10 mg by mouth as needed for allergies.      diphenhydrAMINE (BENADRYL) 50 MG tablet Take 1 tablet (50 mg total) by mouth as directed. Take Benadryl 50 mg po 1 hour before CT scan. 1 tablet 0   doxepin (SINEQUAN) 10 MG capsule Take 10 mg by mouth at bedtime.      fluconazole (DIFLUCAN) 100 MG tablet Take 1 tablet (100 mg total) by mouth daily. 1 tablet 0   fluticasone (FLONASE) 50 MCG/ACT nasal spray Place 1 spray into both nostrils daily as needed for allergies.      guaiFENesin (MUCINEX) 600 MG 12 hr tablet Take 600 mg by mouth daily as  needed for cough or to loosen phlegm. Reported on 03/28/2016     hydrochlorothiazide (MICROZIDE) 12.5 MG capsule Take 1 capsule (12.5 mg total) by mouth daily. 90 capsule 3   lidocaine-prilocaine (EMLA) cream Apply 1 application topically 2 (two) times daily as needed. 30 g 1   losartan (COZAAR) 100 MG tablet Take 1 tablet (100 mg total) by mouth daily. 90 tablet 2   nitroGLYCERIN (NITROSTAT) 0.4 MG SL tablet Place 1 tablet (0.4 mg total) under the tongue every 5 (five) minutes as needed for chest pain. <PLEASE MAKE APPOINTMENT> 25 tablet 3   potassium chloride (K-DUR) 10 MEQ tablet Take 10 mEq by mouth as needed.      pseudoephedrine-acetaminophen (TYLENOL SINUS) 30-500 MG TABS tablet Take 1 tablet by mouth every 4 (four) hours as  needed.     triamcinolone (KENALOG) 0.025 % cream Apply 1 application topically 2 (two) times daily. 30 g 0   DULoxetine (CYMBALTA) 20 MG capsule Take 1 capsule (20 mg total) by mouth daily. 30 capsule 1   No current facility-administered medications for this visit.     PHYSICAL EXAMINATION: ECOG PERFORMANCE STATUS: 2 - Symptomatic, <50% confined to bed  Vitals:   08/27/19 1331  BP: 140/90  Pulse: 72  Resp: 20  Temp: 98.5 F (36.9 C)  SpO2: 99%   Filed Weights   08/27/19 1331  Weight: 133 lb 3.2 oz (60.4 kg)    GENERAL:alert, no distress and comfortable SKIN: skin color, texture, turgor are normal, no rashes or significant lesions (+) Left lateral upper leg 1cm open wound, healing without pus, very tender EYES: normal, Conjunctiva are pink and non-injected, sclera clear  NECK: supple, thyroid normal size, non-tender, without nodularity LYMPH:  no palpable lymphadenopathy in the cervical, axillary  LUNGS: clear to auscultation and percussion with normal breathing effort HEART: regular rate & rhythm and no murmurs and no lower extremity edema ABDOMEN:abdomen soft, non-tender and normal bowel sounds Musculoskeletal:no cyanosis of digits and no  clubbing  NEURO: alert & oriented x 3 with fluent speech, no focal motor/sensory deficits BREAST: S/p left mastectomy: Breast surgically absent and incision healed well. (+) Stable tiny nodules around incision, largest 0.5cm. No palpable mass or adenopathy bilaterally. Breast exam benign.    LABORATORY DATA:  I have reviewed the data as listed CBC Latest Ref Rng & Units 08/27/2019 06/27/2019 04/28/2019  WBC 4.0 - 10.5 K/uL 5.4 4.6 5.0  Hemoglobin 12.0 - 15.0 g/dL 13.3 12.8 13.0  Hematocrit 36.0 - 46.0 % 39.9 39.4 40.7  Platelets 150 - 400 K/uL 157 148(L) 156     CMP Latest Ref Rng & Units 08/27/2019 06/27/2019 04/28/2019  Glucose 70 - 99 mg/dL 86 124(H) 96  BUN 8 - 23 mg/dL 30(H) 15 17  Creatinine 0.44 - 1.00 mg/dL 1.09(H) 0.89 0.90  Sodium 135 - 145 mmol/L 137 142 140  Potassium 3.5 - 5.1 mmol/L 4.0 3.8 4.4  Chloride 98 - 111 mmol/L 100 106 105  CO2 22 - 32 mmol/L 25 26 26   Calcium 8.9 - 10.3 mg/dL 9.3 9.0 9.0  Total Protein 6.5 - 8.1 g/dL 6.6 6.4(L) 6.5  Total Bilirubin 0.3 - 1.2 mg/dL 0.5 0.4 0.4  Alkaline Phos 38 - 126 U/L 88 87 83  AST 15 - 41 U/L 21 20 21   ALT 0 - 44 U/L 14 13 15       RADIOGRAPHIC STUDIES: I have personally reviewed the radiological images as listed and agreed with the findings in the report. No results found.   ASSESSMENT & PLAN:  RAFAEL QUESADA is a 85 y.o. female with   1. Cancer of the central portion of left female breast, Stage IIB, T2 N1 invasive lobular carcinoma, grade 2, ER 95%, PR 97%, Ki-67 17%, HER-2/neu no amplification, diagnosed in 07/2010, local chest wall recurrence in 09/2015  -She wasdiagnosed in 10/2011but hadchest wall local recurrence in 09/2015.She was on adjuvant tamoxifen for 4.5 years, stopped on her own anddeveloped local recurrence 8 months after she stopped tamoxifen. -She has completed chest wall radiation, however developedlocal recurrence again andunderwent a second chest wall surgery for two soft tissue resection in  06/2016. Unfortunately the surgicalmargins were positive. I spoke with Dr. Marlou Starks, no more surgery will be offered, because complete surgical resection is unlikely. Patient is also not in  favor of more extensive surgery. -She has been on fulvestrant injection starting 8/9/18due to clinical suspicion for disease progress.She has been tolerating Faslodex injections well overall.  -Due to possible anaphylactic reaction, she will not receive CT contrast with scans. -PET from 04/04/19 showsno evidence of recurrence or metastatic disease.  -She notes still having left lateral chest wall pain, manageable currently. She notes having recent b/l LE pain. I suspect this is related to her Fulvestrant injection. She is currently on extra strength Tylenol. I will call in Cymbalta today, benefit and the potential side effects discussed with her, she agreed.  -From a breast cancer standpoint she is clinically doing well. Lab reviewed, her CBC and CMP are within normal limits. She does have tenderness of her left chest wall on exam today, otherwise unremarkable. There is no clinical concern for recurrence. -Will proceed with Fulvestrant today, continue monthly  -F/u in 2 months with PET scan   2. HTN, CAD  -She'll continue follow-up with her primary care physician and cardiologist -I previously encouraged her to monitor her BP at home and if remains elevated she should see her PCP. She is on Losartan.  3. Osteoporosis  -Her last DEXA scan in 06/2012 showed osteoporosis  -I previously encouraged her to continue calcium and Vit D -I previously encouraged her to repeat DEXA at her PCP office   4. Goal of care discussion  -The patient understands the goal of care is palliative. -she is full code for now  5. Furuncle  -She has had a furuncle in left lateral upper leg sore for 1 month. Due to pain she was seen by [facility] ED and had sore drained and started on Oral Doxycycline. She plans to complete this week.    -She has an healing wound, 1cm, with mild surrounding erythema. I encouraged her to continue to keep it clean. This location does contribute to her recent significant LE pain. She has been taking 1038m extra strength tylenol.  -She will be seen by Dermatologist tomorrow.  -I spoke with her daughter on the phone during her visit    Plan -I called in Cymbalta today for her left chest and body pain.  -Labs reviewed, will proceed with Fulvestrant injection today and continue monthly  -F/u in 2 months with lab and PET scan a few days before.    No problem-specific Assessment & Plan notes found for this encounter.   Orders Placed This Encounter  Procedures   NM PET Image Restag (PS) Skull Base To Thigh    Standing Status:   Future    Standing Expiration Date:   08/26/2020    Order Specific Question:   If indicated for the ordered procedure, I authorize the administration of a radiopharmaceutical per Radiology protocol    Answer:   Yes    Order Specific Question:   Preferred imaging location?    Answer:   [facility]   Order Specific Question:   Radiology Contrast Protocol - do NOT remove file path    Answer:   [email or web address]   All questions were answered. The patient knows to call the clinic with any problems, questions or concerns. No barriers to learning was detected. I spent 20 minutes counseling the patient face to face. The total time spent in the appointment was 25 minutes and more than 50% was on counseling and review of test results     YTruitt Merle MD 08/27/2019   I, AJoslyn Devon am acting as scribe for YTruitt Merle MD.  I have reviewed the above documentation for accuracy and completeness, and I agree with the above.

## 2019-08-20 NOTE — Progress Notes (Signed)
Virtual Visit via Video Note   This visit type was conducted due to national recommendations for restrictions regarding the COVID-19 Pandemic (e.g. social distancing) in an effort to limit this patient's exposure and mitigate transmission in our community.  Due to her co-morbid illnesses, this patient is at least at moderate risk for complications without adequate follow up.  This format is felt to be most appropriate for this patient at this time.  All issues noted in this document were discussed and addressed.  A limited physical exam was performed with this format.  Please refer to the patient's chart for her consent to telehealth for [facility].   Date:  08/20/2019   ID:  Gloria [name], DOB [birth year], MRN [id number]  Patient Location: Home Provider Location: Home  PCP:  Lajean Manes, MD  Cardiologist: [facility] Electrophysiologist:  None   Evaluation Performed:  Follow-Up Visit  Chief Complaint: Hypertension   History of Present Illness:    Gloria [name] is a 85 y.o. female we are following for ongoing assessment and management of coronary artery disease, history of LAD, diagonal, and right coronary artery stenosis by cath in 2012, history of hypertension, breast cancer, and GERD.  She is status post left mastectomy with radiation therapy and had been on chronic tamoxifen therapy in the past.  Other history includes anxiety and depression.  Gloria [name]'s daughter called [place] on 08/13/2019 with patient complains of elevated blood pressure.  She stated that her mother's PCP just started her on amlodipine along with her losartan and she was having lower extremity edema and pain.  Her daughter did not think that she needed to be on both medications, as she was having the side effects with a 2 pound weight gain.  She is here to discuss her blood pressure control and medication regimen.  She is seen today with her daughter in attendance as she is very hard of hearing.  She is gained  approximately 5 pounds and has some lower extremity edema, she is noticing it mostly behind her knees bilaterally and into her feet.  She does wear support hose.  Blood pressure is elevated today at 170/100.  She has not taken amlodipine for the last 24 hours as she stated the swelling was worse on that medication.  She is taking losartan 25 mg daily.  She had taken her medication this morning.  On last office visit I ncreased her losartan to 50 mg daily and added lose dose HCTZ 12.5 mg to her regimen. Stopped the amlodipine as she was not taking it. She was to call us with BP records.   Gloria [name] is seen today via video, and states that her blood pressure is not any better controlled.  She states that the lower extremity edema is vastly improved but her blood pressures been running in the 150s to 160s.  On review of her medication she is taking 25 mg of losartan in the morning and 25 mg of losartan in the evening.  She has some flushing in the evening after taking it.  She also shows me what appears to be a spider bite on her lower thigh on the left which appears to be infected.  At first it was bleeding and now there is erythema and pain at the site.  She has been treating it with Neosporin and covering it.  She states that it is painful at times.  She is trying to see her primary care physician about this.  She has  multiple antibiotic allergies.  The patient does not have symptoms concerning for COVID-19 infection (fever, chills, cough, or new shortness of breath).    Past Medical History:  Diagnosis Date  . Anxiety   . Blood in urine   . CAD (coronary artery disease)    non obstructive by cath  . Cancer [facility])    breast- left  . Depression   . GERD (gastroesophageal reflux disease)   . Hypertension   . Knee fracture   . Personal history of radiation therapy 2017  . TR (tricuspid regurgitation)    mild by Echo 12/2008 EF >55%   Past Surgical History:  Procedure Laterality Date  .  ABDOMINAL AORTAGRAM N/A 09/08/2011   Procedure: ABDOMINAL Maxcine Ham;  Surgeon: Troy Sine, MD;  Location: [facility] C[facility] LAB;  Service: Cardiovascular;  Laterality: N/A;  . ABDOMINAL HYSTERECTOMY    . BREAST EXCISIONAL BIOPSY Left 2017   X3  . BREAST SURGERY  08-30-10   mastectomy  . CARDIAC CATHETERIZATION  08/2011   20% LAD stenosis, 20% diagonal stenosis, 10-20% proximal dominant RCA stenosis  . COLON SURGERY    . LEFT HEART CATHETERIZATION WITH CORONARY ANGIOGRAM N/A 09/08/2011   Procedure: LEFT HEART CATHETERIZATION WITH CORONARY ANGIOGRAM;  Surgeon: Troy Sine, MD;  Location: [facility] CATH LAB;  Service: Cardiovascular;  Laterality: N/A;  . MASTECTOMY    . MINOR BREAST BIOPSY Left 11/25/2015   Procedure: MINOR EXCISION MASS LEFT CHEST WALL;  Surgeon: Autumn Messing III, MD;  Location: [place];  Service: General;  Laterality: Left;  . MINOR BREAST BIOPSY Left 07/17/2016   Procedure: EXCISION OF 2 LEFT CHEST WALL MASSES;  Surgeon: Autumn Messing III, MD;  Location: [place];  Service: General;  Laterality: Left;  . TONSILLECTOMY       No outpatient medications have been marked as taking for the 08/21/19 encounter (Appointment) with Lendon Colonel, NP.     Allergies:   Contrast media [iodinated diagnostic agents], Penicillins, Adhesive [tape], Cephalexin, Ciprofloxacin, Demerol, Sulfamethoxazole-trimethoprim, and Quinolones   Social History   Tobacco Use  . Smoking status: Former Smoker    Quit date: 11/15/1991    Years since quitting: 27.7  . Smokeless tobacco: Never Used  Substance Use Topics  . Alcohol use: Yes    Alcohol/week: 2.0 standard drinks    Types: 2 Glasses of wine per week    Comment: 1 glass per week  . Drug use: No     Family Hx: The patient's family history includes Arthritis in her sister; Breast cancer in her maternal aunt and sister; Cancer in her brother, father, maternal grandmother, and sister; Dementia in her mother; Heart attack  in her brother and brother; Heart disease in her sister; Sudden death in her brother.  ROS:   Please see the history of present illness.    All other systems reviewed and are negative.   Prior CV studies:   The following studies were reviewed today: Echocardiogram 08/22/2016 Left ventricle: The cavity size was normal. Wall thickness was   normal. Systolic function was normal. The estimated ejection   fraction was in the range of 60% to 65%. Wall motion was normal;   there were no regional wall motion abnormalities. Doppler   parameters are consistent with abnormal left ventricular   relaxation (grade 1 diastolic dysfunction). - Left atrium: The atrium was mildly dilated.  Labs/Other Tests and Data Reviewed:    EKG:  No ECG reviewed.  Recent Labs: 06/27/2019:  ALT 13; BUN 15; Creatinine, Ser 0.89; Hemoglobin 12.8; Platelets 148; Potassium 3.8; Sodium 142   Recent Lipid Panel Lab Results  Component Value Date/Time   CHOL  11/20/2007 05:05 AM    129        ATP III CLASSIFICATION:  <200     mg/dL   Desirable  200-239  mg/dL   Borderline High  >=240    mg/dL   High   TRIG 78 11/20/2007 05:05 AM   HDL 62 11/20/2007 05:05 AM   CHOLHDL 2.1 11/20/2007 05:05 AM   LDLCALC  11/20/2007 05:05 AM    51        Total Cholesterol/HDL:CHD Risk Coronary Heart Disease Risk Table                     Men   Women  1/2 Average Risk   3.4   3.3    Wt Readings from Last 3 Encounters:  06/27/19 134 lb (60.8 kg)  04/28/19 131 lb 1.6 oz (59.5 kg)  02/27/19 132 lb 11.2 oz (60.2 kg)     Objective:    Vital Signs:  There were no vitals taken for this visit.   VITAL SIGNS:  reviewed GEN:  no acute distress EYES:  sclerae anicteric, EOMI - Extraocular Movements Intact RESPIRATORY:  normal respiratory effort, symmetric expansion CARDIOVASCULAR:  no peripheral edema SKIN:  Open wound upper left thigh, size of a dime, with errythema noted, and some drainage. No bleeding.  MUSCULOSKELETAL:  no  obvious deformities. NEURO:  Hard of hearing, otherwise normal.  PSYCH:  normal affect  ASSESSMENT & PLAN:    1. Hypertension: Blood pressures currently not well controlled on increased dose of losartan to 50 mg daily with addition of 12.5 mg of HCTZ.  Daughter states her blood pressures have been running [id number] to [id number] systolic.  The patient has been taking 25 mg twice daily of losartan.  Having flushing at nighttime.  I advised her to take the losartan once a day in the morning and I am increasing it to 100 mg daily, she is to continue HCTZ 12.5 mg daily.  I want to see her back in the office in 1 month with BMET to follow.  She is to call us if she has any dizziness, lightheadedness, or blood pressure is less than 130/60.  2.  Open wound to thigh: She has a open wound to the lower left thigh that appears to be a insect bite that is gotten infected.  She has multiple medication allergies.  She is going to try and see her PCP.  I am going to start her on a very low dose of Zithromax 250 mg daily, unless she sees her PCP today.  If that is the case she will not start that antibiotic and follow PCP recommendations.  If she cannot get into see her PCP today or this week, she will begin the antibiotics until she is able to be seen.  Hopefully she will be seen within the next week.  3.  CAD: Currently without complaints of discomfort in her chest, shortness of breath, or fatigue.  COVID-19 Education: The signs and symptoms of COVID-19 were discussed with the patient and how to seek care for testing (follow up with PCP or arrange E-visit).  The importance of social distancing was discussed today.  Time:   Today, I have spent 25 minutes with the patient with telehealth technology discussing the above problems.     Medication  Adjustments/Labs and Tests Ordered: Current medicines are reviewed at length with the patient today.  Concerns regarding medicines are outlined above.   Tests Ordered: No orders  of the defined types were placed in this encounter.   Medication Changes: No orders of the defined types were placed in this encounter.   Disposition:  Follow up 1 month  Signed, Phill Myron. West Pugh, ANP, AACC  08/20/2019 7:33 AM    [facility]

## 2019-08-21 ENCOUNTER — Telehealth (INDEPENDENT_AMBULATORY_CARE_PROVIDER_SITE_OTHER): Payer: PPO | Admitting: Adult Health

## 2019-08-21 ENCOUNTER — Encounter: Payer: Self-pay | Admitting: Adult Health

## 2019-08-21 VITALS — BP 170/110 | HR 78 | Ht 60.0 in | Wt 132.0 lb

## 2019-08-21 DIAGNOSIS — I2510 Atherosclerotic heart disease of native coronary artery without angina pectoris: Secondary | ICD-10-CM

## 2019-08-21 DIAGNOSIS — L989 Disorder of the skin and subcutaneous tissue, unspecified: Secondary | ICD-10-CM | POA: Diagnosis not present

## 2019-08-21 DIAGNOSIS — I10 Essential (primary) hypertension: Secondary | ICD-10-CM | POA: Diagnosis not present

## 2019-08-21 DIAGNOSIS — W57XXXA Bitten or stung by nonvenomous insect and other nonvenomous arthropods, initial encounter: Secondary | ICD-10-CM

## 2019-08-21 DIAGNOSIS — S70362A Insect bite (nonvenomous), left thigh, initial encounter: Secondary | ICD-10-CM

## 2019-08-21 MED ORDER — AZITHROMYCIN 250 MG PO TABS: ORAL_TABLET | ORAL | 0 refills | Status: DC

## 2019-08-21 MED ORDER — LOSARTAN POTASSIUM 100 MG PO TABS: 100.0000 mg | ORAL_TABLET | Freq: Every day | ORAL | 2 refills | Status: DC

## 2019-08-21 NOTE — Patient Instructions (Addendum)
Medication Instructions:  INCREASE- Losartan 100 mg by mouth daily  If you need a refill on your cardiac medications before your next appointment, please call your pharmacy.  Labwork: None Ordered   Testing/Procedures: None Ordered  Follow-Up: Your physician recommends that you schedule a follow-up appointment in: 2 weeks Wednesday November 11th @ 9:45 am   At [facility], you and your health needs are our priority.  As part of our continuing mission to provide you with exceptional heart care, we have created designated Provider Care Teams.  These Care Teams include your primary Cardiologist (physician) and Advanced Practice Providers (APPs -  Physician Assistants and Nurse Practitioners) who all work together to provide you with the care you need, when you need it.  Thank you for choosing [facility] at [facility]!!

## 2019-08-23 ENCOUNTER — Encounter (HOSPITAL_COMMUNITY): Payer: Self-pay | Admitting: Emergency Medicine

## 2019-08-23 ENCOUNTER — Other Ambulatory Visit: Payer: Self-pay

## 2019-08-23 ENCOUNTER — Emergency Department (HOSPITAL_COMMUNITY)
Admission: EM | Admit: 2019-08-23 | Discharge: 2019-08-23 | Disposition: A | Discharge status: Home / Self Care | Payer: PPO | Attending: Emergency Medicine | Admitting: Emergency Medicine

## 2019-08-23 DIAGNOSIS — Z87891 Personal history of nicotine dependence: Secondary | ICD-10-CM | POA: Diagnosis not present

## 2019-08-23 DIAGNOSIS — L02414 Cutaneous abscess of left upper limb: Secondary | ICD-10-CM | POA: Insufficient documentation

## 2019-08-23 DIAGNOSIS — I2510 Atherosclerotic heart disease of native coronary artery without angina pectoris: Secondary | ICD-10-CM | POA: Insufficient documentation

## 2019-08-23 DIAGNOSIS — I10 Essential (primary) hypertension: Secondary | ICD-10-CM | POA: Insufficient documentation

## 2019-08-23 DIAGNOSIS — Z79899 Other long term (current) drug therapy: Secondary | ICD-10-CM | POA: Insufficient documentation

## 2019-08-23 DIAGNOSIS — L989 Disorder of the skin and subcutaneous tissue, unspecified: Secondary | ICD-10-CM | POA: Diagnosis not present

## 2019-08-23 DIAGNOSIS — C50012 Malignant neoplasm of nipple and areola, left female breast: Secondary | ICD-10-CM | POA: Diagnosis not present

## 2019-08-23 DIAGNOSIS — Z9012 Acquired absence of left breast and nipple: Secondary | ICD-10-CM | POA: Diagnosis not present

## 2019-08-23 MED ORDER — LIDOCAINE-EPINEPHRINE (PF) 2 %-1:200000 IJ SOLN
10.0000 mL | Freq: Once | INTRAMUSCULAR | Status: AC
  Administered 2019-08-23: 10 mL via INTRADERMAL
  Filled 2019-08-23: qty 20

## 2019-08-23 NOTE — ED Notes (Signed)
Pt. Given 2 warm blankets per request.

## 2019-08-23 NOTE — Discharge Instructions (Signed)
Keep the area clean and dry.  You can do warm soaks and flushes a few times daily.  You can take 1 to 2 tablets of Tylenol every 4-6 hours as needed for pain.   Follow-up with your dermatologist on Thursday as scheduled for reevaluation of your symptoms.  Please take all of your antibiotics until finished!   You may develop abdominal discomfort or diarrhea from the antibiotic.  You may help offset this with probiotics which you can buy or get in yogurt. Do not eat  or take the probiotics until 2 hours after your antibiotic.   Return to the emergency department immediately for any concerning signs or symptoms develop such as fevers, vomiting, worsening spread of redness.

## 2019-08-23 NOTE — ED Provider Notes (Signed)
[place] EMERGENCY DEPARTMENT Provider Note   CSN: [id number] Arrival date & time: 08/23/19  1117     History   Chief Complaint Chief Complaint  Patient presents with  . Wound Check    HPI Gloria [name] is a 85 y.o. female with history of CAD, hypertension, left breast cancer currently undergoing fulvestrant injections presenting for evaluation of acute onset, progressively worsening lesion of the left thigh for 1.5 months.  She reports that started out as a very small erythematous area that she thought may have been a spider bite 1.5 months ago and since then has began to spread.  She has noted some color changes.  More recently it has become more painful to touch.  She went to her PCP on Thursday and was started on doxycycline which she has had 3 tablets so far.  Denies fever, nausea, vomiting, abdominal pain.  Has also been cleaning the area with antibacterial soap and antibiotic ointment.  She does have an appointment to see a dermatologist on Thursday.    The history is provided by the patient.    Past Medical History:  Diagnosis Date  . Anxiety   . Blood in urine   . CAD (coronary artery disease)    non obstructive by cath  . Cancer [facility])    breast- left  . Depression   . GERD (gastroesophageal reflux disease)   . Hypertension   . Knee fracture   . Personal history of radiation therapy 2017  . TR (tricuspid regurgitation)    mild by Echo 12/2008 EF >55%    Patient Active Problem List   Diagnosis Date Noted  . Chest pain 03/15/2016  . Cancer of central portion of left female breast ([place]) 12/09/2015  . Bronchitis, chronic obstructive w acute bronchitis ([place]) 03/16/2014  . Bronchitis with bronchospasm 03/15/2014  . CAD (coronary artery disease), non obstructive  05/06/2013  . HYPERTENSION, BENIGN 12/23/2008  . HEMORRHOIDS 03/13/2008  . OTHER DYSPHAGIA 03/13/2008  . Asthma, intrinsic 10/30/2007  . G E R D 09/19/2007    Past Surgical History:   Procedure Laterality Date  . ABDOMINAL AORTAGRAM N/A 09/08/2011   Procedure: ABDOMINAL Maxcine Ham;  Surgeon: Troy Sine, MD;  Location: [facility] CATH LAB;  Service: Cardiovascular;  Laterality: N/A;  . ABDOMINAL HYSTERECTOMY    . BREAST EXCISIONAL BIOPSY Left 2017   X3  . BREAST SURGERY  08-30-10   mastectomy  . CARDIAC CATHETERIZATION  08/2011   20% LAD stenosis, 20% diagonal stenosis, 10-20% proximal dominant RCA stenosis  . COLON SURGERY    . LEFT HEART CATHETERIZATION WITH CORONARY ANGIOGRAM N/A 09/08/2011   Procedure: LEFT HEART CATHETERIZATION WITH CORONARY ANGIOGRAM;  Surgeon: Troy Sine, MD;  Location: [facility] C[facility] LAB;  Service: Cardiovascular;  Laterality: N/A;  . MASTECTOMY    . MINOR BREAST BIOPSY Left 11/25/2015   Procedure: MINOR EXCISION MASS LEFT CHEST WALL;  Surgeon: Autumn Messing III, MD;  Location: [place];  Service: General;  Laterality: Left;  . MINOR BREAST BIOPSY Left 07/17/2016   Procedure: EXCISION OF 2 LEFT CHEST WALL MASSES;  Surgeon: Autumn Messing III, MD;  Location: [place];  Service: General;  Laterality: Left;  . TONSILLECTOMY       OB History   No obstetric history on file.      Home Medications    Prior to Admission medications   Medication Sig Start Date End Date Taking? Authorizing Provider  acetaminophen (TYLENOL) 500 MG  tablet Take 1,000 mg by mouth daily as needed for moderate pain. Reported on 03/10/2016    [provider]  ALPRAZolam Duanne Moron) 0.25 MG tablet Take 1/2 tablet by mouth in the morning and 1 tab at bedtime as needed 08/14/19   Alla Feeling, NP  aspirin 81 MG chewable tablet Chew 81 mg by mouth once a week. Reported on 03/10/2016    [provider]  azithromycin (ZITHROMAX) 250 MG tablet Take one tablet by mouth daily 08/21/19   Lendon Colonel, NP  CALCIUM-MAGNESIUM-VITAMIN D PO Take 1 tablet by mouth 2 (two) times daily.    [provider]  cetirizine (ZYRTEC) 10 MG tablet Take  10 mg by mouth as needed for allergies.     [provider]  diphenhydrAMINE (BENADRYL) 50 MG tablet Take 1 tablet (50 mg total) by mouth as directed. Take Benadryl 50 mg po 1 hour before CT scan. 09/18/17   Truitt Merle, MD  doxepin (SINEQUAN) 10 MG capsule Take 10 mg by mouth at bedtime.     [provider]  fluconazole (DIFLUCAN) 100 MG tablet Take 1 tablet (100 mg total) by mouth daily. 06/21/17   Truitt Merle, MD  fluticasone (FLONASE) 50 MCG/ACT nasal spray Place 1 spray into both nostrils daily as needed for allergies.     [provider]  guaiFENesin (MUCINEX) 600 MG 12 hr tablet Take 600 mg by mouth daily as needed for cough or to loosen phlegm. Reported on 03/28/2016    [provider]  hydrochlorothiazide (MICROZIDE) 12.5 MG capsule Take 1 capsule (12.5 mg total) by mouth daily. 08/14/19 11/12/19  Lendon Colonel, NP  lidocaine-prilocaine (EMLA) cream Apply 1 application topically 2 (two) times daily as needed. 05/09/18   Truitt Merle, MD  losartan (COZAAR) 100 MG tablet Take 1 tablet (100 mg total) by mouth daily. 08/21/19   Lendon Colonel, NP  nitroGLYCERIN (NITROSTAT) 0.4 MG SL tablet Place 1 tablet (0.4 mg total) under the tongue every 5 (five) minutes as needed for chest pain. <PLEASE MAKE APPOINTMENT> 02/25/16   Troy Sine, MD  potassium chloride (K-DUR) 10 MEQ tablet Take 10 mEq by mouth as needed.     [provider]  pseudoephedrine-acetaminophen (TYLENOL SINUS) 30-500 MG TABS tablet Take 1 tablet by mouth every 4 (four) hours as needed.    [provider]  triamcinolone (KENALOG) 0.025 % cream Apply 1 application topically 2 (two) times daily. 11/20/16   Truitt Merle, MD    Family History Family History  Problem Relation Age of Onset  . Cancer Father        prostate  . Cancer Sister        breast  . Heart disease Sister   . Breast cancer Sister   . Cancer Brother        pancreatic  . Cancer Maternal Grandmother         colon  . Dementia Mother   . Sudden death Brother   . Heart attack Brother   . Heart attack Brother   . Arthritis Sister   . Breast cancer Maternal Aunt     Social History Social History   Tobacco Use  . Smoking status: Former Smoker    Quit date: 11/15/1991    Years since quitting: 27.7  . Smokeless tobacco: Never Used  Substance Use Topics  . Alcohol use: Yes    Alcohol/week: 2.0 standard drinks    Types: 2 Glasses of wine per week  Comment: 1 glass per week  . Drug use: No     Allergies   Contrast media [iodinated diagnostic agents], Penicillins, Adhesive [tape], Cephalexin, Ciprofloxacin, Demerol, Sulfamethoxazole-trimethoprim, and Quinolones   Review of Systems Review of Systems  Constitutional: Negative for fever.  Gastrointestinal: Negative for abdominal pain, nausea and vomiting.  Skin: Positive for color change.  Neurological: Negative for weakness.  All other systems reviewed and are negative.    Physical Exam Updated Vital Signs BP (!) 131/96 (BP Location: Right Arm)   Pulse 90   Temp 97.9 F (36.6 C) (Oral)   Resp 17   SpO2 100%   Physical Exam Vitals signs and nursing note reviewed.  Constitutional:      General: She is not in acute distress.    Appearance: She is well-developed.     Comments: Resting comfortably in no apparent distress  HENT:     Head: Normocephalic and atraumatic.  Eyes:     General:        Right eye: No discharge.        Left eye: No discharge.     Conjunctiva/sclera: Conjunctivae normal.  Neck:     Vascular: No JVD.     Trachea: No tracheal deviation.  Cardiovascular:     Rate and Rhythm: Normal rate and regular rhythm.  Pulmonary:     Effort: Pulmonary effort is normal.     Breath sounds: Normal breath sounds.     Comments: Status post left mastectomy Abdominal:     General: There is no distension.     Palpations: Abdomen is soft.     Tenderness: There is no abdominal tenderness. There is no guarding.   Skin:    General: Skin is warm and dry.     Findings: Erythema and lesion present.     Comments: See below image.  Patient with a 3 x 3 cm area of erythema to the lateral aspect of the left thigh.  There is some central fluctuance noted.  No active drainage.  Neurological:     Mental Status: She is alert.  Psychiatric:        Behavior: Behavior normal.        ED Treatments / Results  Labs (all labs ordered are listed, but only abnormal results are displayed) Labs Reviewed - No data to display  EKG None  Radiology No results found.  Procedures .Marland KitchenIncision and Drainage  Date/Time: 08/23/2019 2:38 PM Performed by: Renita Papa, PA-C Authorized by: Renita Papa, PA-C   Consent:    Consent obtained:  Verbal   Consent given by:  Patient   Risks discussed:  Bleeding, incomplete drainage and infection   Alternatives discussed:  No treatment and delayed treatment Location:    Type:  Abscess   Size:  1cm   Location:  Lower extremity   Lower extremity location:  Leg   Leg location:  L upper leg Pre-procedure details:    Skin preparation:  Betadine Anesthesia (see MAR for exact dosages):    Anesthesia method:  Local infiltration   Local anesthetic:  Lidocaine 2% WITH epi Procedure type:    Complexity:  Simple Procedure details:    Incision types:  Stab incision   Incision depth:  Dermal   Scalpel blade:  11   Wound management:  Probed and deloculated, irrigated with saline and extensive cleaning   Drainage:  Bloody   Drainage amount:  Scant   Wound treatment:  Wound left open   Packing materials:  None Post-procedure details:    Patient tolerance of procedure:  Tolerated well, no immediate complications   (including critical care time)  Medications Ordered in ED Medications  lidocaine-EPINEPHrine (XYLOCAINE W/EPI) 2 %-1:200000 (PF) injection 10 mL (10 mLs Intradermal Given 08/23/19 1403)     Initial Impression / Assessment and Plan / ED Course  I have  reviewed the triage vital signs and the nursing notes.  Pertinent labs & imaging results that were available during my care of the patient were reviewed by me and considered in my medical decision making (see chart for details).        Patient presenting for evaluation of persistent and worsening skin lesion to the left thigh for 1 month.  Recently started on doxycycline by her PCP, has only had 3 doses thus far.  No constitutional symptoms.  She is afebrile, vital signs are stable.  She is nontoxic in appearance. Doubt necrotizing fasciitis.  Based on the appearance of the lesion we felt there may be some underlying purulence or fluid collection that she underwent I&D with small amount of bloody discharge which she tolerated without difficulty.  Did not warrant packing.  She has an appointment to see a dermatologist on Thursday.  Encouraged her to continue taking her antibiotic course to completion.  Discussed wound care.  Discussed strict ED return precautions. Patient verbalized understanding of and agreement with plan and is safe for discharge home at this time.  Patient was seen and evaluated by Dr. Sedonia Small who agrees with assessment and plan at this time.  Final Clinical Impressions(s) / ED Diagnoses   Final diagnoses:  Skin lesion of left lower extremity    ED Discharge Orders    None       Renita Papa, PA-C 08/24/19 [id number]    Maudie Flakes, MD 08/24/19 [phone]

## 2019-08-23 NOTE — ED Triage Notes (Signed)
Pt saw PCP for wound on L upper leg on Thursday.  Started on Doxycycline and told to follow-up with dermatologist.  States she was unable to get appt on Friday.  States it started out like a spider bite 1 Month ago.

## 2019-08-27 ENCOUNTER — Encounter: Payer: Self-pay | Admitting: Hematology

## 2019-08-27 ENCOUNTER — Inpatient Hospital Stay (HOSPITAL_BASED_OUTPATIENT_CLINIC_OR_DEPARTMENT_OTHER): Payer: PPO | Admitting: Hematology

## 2019-08-27 ENCOUNTER — Inpatient Hospital Stay: Payer: PPO | Attending: Hematology

## 2019-08-27 ENCOUNTER — Inpatient Hospital Stay: Payer: PPO

## 2019-08-27 ENCOUNTER — Other Ambulatory Visit: Payer: Self-pay

## 2019-08-27 VITALS — BP 140/90 | HR 72 | Temp 98.5°F | Resp 20 | Ht 60.0 in | Wt 133.2 lb

## 2019-08-27 DIAGNOSIS — I2510 Atherosclerotic heart disease of native coronary artery without angina pectoris: Secondary | ICD-10-CM | POA: Diagnosis not present

## 2019-08-27 DIAGNOSIS — C50112 Malignant neoplasm of central portion of left female breast: Secondary | ICD-10-CM | POA: Diagnosis not present

## 2019-08-27 DIAGNOSIS — M810 Age-related osteoporosis without current pathological fracture: Secondary | ICD-10-CM | POA: Insufficient documentation

## 2019-08-27 DIAGNOSIS — F329 Major depressive disorder, single episode, unspecified: Secondary | ICD-10-CM | POA: Diagnosis not present

## 2019-08-27 DIAGNOSIS — F419 Anxiety disorder, unspecified: Secondary | ICD-10-CM | POA: Insufficient documentation

## 2019-08-27 DIAGNOSIS — Z923 Personal history of irradiation: Secondary | ICD-10-CM | POA: Diagnosis not present

## 2019-08-27 DIAGNOSIS — I10 Essential (primary) hypertension: Secondary | ICD-10-CM | POA: Insufficient documentation

## 2019-08-27 DIAGNOSIS — Z170 Estrogen receptor positive status [ER+]: Secondary | ICD-10-CM | POA: Insufficient documentation

## 2019-08-27 DIAGNOSIS — C7989 Secondary malignant neoplasm of other specified sites: Secondary | ICD-10-CM | POA: Insufficient documentation

## 2019-08-27 DIAGNOSIS — L0292 Furuncle, unspecified: Secondary | ICD-10-CM | POA: Insufficient documentation

## 2019-08-27 DIAGNOSIS — Z7982 Long term (current) use of aspirin: Secondary | ICD-10-CM | POA: Diagnosis not present

## 2019-08-27 DIAGNOSIS — Z9012 Acquired absence of left breast and nipple: Secondary | ICD-10-CM | POA: Diagnosis not present

## 2019-08-27 DIAGNOSIS — Z79899 Other long term (current) drug therapy: Secondary | ICD-10-CM | POA: Diagnosis not present

## 2019-08-27 LAB — COMPREHENSIVE METABOLIC PANEL
ALT: 14 U/L (ref 0–44)
AST: 21 U/L (ref 15–41)
Albumin: 3.9 g/dL (ref 3.5–5.0)
Alkaline Phosphatase: 88 U/L (ref 38–126)
Anion gap: 12 (ref 5–15)
BUN: 30 mg/dL — ABNORMAL HIGH (ref 8–23)
CO2: 25 mmol/L (ref 22–32)
Calcium: 9.3 mg/dL (ref 8.9–10.3)
Chloride: 100 mmol/L (ref 98–111)
Creatinine, Ser: 1.09 mg/dL — ABNORMAL HIGH (ref 0.44–1.00)
GFR calc Af Amer: 52 mL/min — ABNORMAL LOW (ref >60)
GFR calc non Af Amer: 45 mL/min — ABNORMAL LOW (ref >60)
Glucose, Bld: 86 mg/dL (ref 70–99)
Potassium: 4 mmol/L (ref 3.5–5.1)
Sodium: 137 mmol/L (ref 135–145)
Total Bilirubin: 0.5 mg/dL (ref 0.3–1.2)
Total Protein: 6.6 g/dL (ref 6.5–8.1)

## 2019-08-27 LAB — CBC WITH DIFFERENTIAL/PLATELET
Abs Immature Granulocytes: 0.02 10*3/uL (ref 0.00–0.07)
Basophils Absolute: 0.1 10*3/uL (ref 0.0–0.1)
Basophils Relative: 2 %
Eosinophils Absolute: 0.5 10*3/uL (ref 0.0–0.5)
Eosinophils Relative: 8 %
HCT: 39.9 % (ref 36.0–46.0)
Hemoglobin: 13.3 g/dL (ref 12.0–15.0)
Immature Granulocytes: 0 %
Lymphocytes Relative: 19 %
Lymphs Abs: 1 10*3/uL (ref 0.7–4.0)
MCH: 31.1 pg (ref 26.0–34.0)
MCHC: 33.3 g/dL (ref 30.0–36.0)
MCV: 93.2 fL (ref 80.0–100.0)
Monocytes Absolute: 0.5 10*3/uL (ref 0.1–1.0)
Monocytes Relative: 10 %
Neutro Abs: 3.3 10*3/uL (ref 1.7–7.7)
Neutrophils Relative %: 61 %
Platelets: 157 10*3/uL (ref 150–400)
RBC: 4.28 MIL/uL (ref 3.87–5.11)
RDW: 12.4 % (ref 11.5–15.5)
WBC: 5.4 10*3/uL (ref 4.0–10.5)
nRBC: 0 % (ref 0.0–0.2)

## 2019-08-27 MED ORDER — FULVESTRANT 250 MG/5ML IM SOLN
500.0000 mg | INTRAMUSCULAR | Status: DC
  Administered 2019-08-27: 500 mg via INTRAMUSCULAR

## 2019-08-27 MED ORDER — DULOXETINE HCL 20 MG PO CPEP: 20.0000 mg | ORAL_CAPSULE | Freq: Every day | ORAL | 1 refills | Status: DC

## 2019-08-27 MED ORDER — FULVESTRANT 250 MG/5ML IM SOLN
INTRAMUSCULAR | Status: AC
  Filled 2019-08-27: qty 5

## 2019-08-27 NOTE — Patient Instructions (Signed)
Fulvestrant injection What is this medicine? FULVESTRANT (ful VES trant) blocks the effects of estrogen. It is used to treat breast cancer. This medicine may be used for other purposes; ask your health care provider or pharmacist if you have questions. COMMON BRAND NAME(S): FASLODEX What should I tell my health care provider before I take this medicine? They need to know if you have any of these conditions:  bleeding disorders  liver disease  low blood counts, like low white cell, platelet, or red cell counts  an unusual or allergic reaction to fulvestrant, other medicines, foods, dyes, or preservatives  pregnant or trying to get pregnant  breast-feeding How should I use this medicine? This medicine is for injection into a muscle. It is usually given by a health care professional in a hospital or clinic setting. Talk to your pediatrician regarding the use of this medicine in children. Special care may be needed. Overdosage: If you think you have taken too much of this medicine contact a poison control center or emergency room at once. NOTE: This medicine is only for you. Do not share this medicine with others. What if I miss a dose? It is important not to miss your dose. Call your doctor or health care professional if you are unable to keep an appointment. What may interact with this medicine?  medicines that treat or prevent blood clots like warfarin, enoxaparin, dalteparin, apixaban, dabigatran, and rivaroxaban This list may not describe all possible interactions. Give your health care provider a list of all the medicines, herbs, non-prescription drugs, or dietary supplements you use. Also tell them if you smoke, drink alcohol, or use illegal drugs. Some items may interact with your medicine. What should I watch for while using this medicine? Your condition will be monitored carefully while you are receiving this medicine. You will need important blood work done while you are taking  this medicine. Do not become pregnant while taking this medicine or for at least 1 year after stopping it. Women of child-bearing potential will need to have a negative pregnancy test before starting this medicine. Women should inform their doctor if they wish to become pregnant or think they might be pregnant. There is a potential for serious side effects to an unborn child. Men should inform their doctors if they wish to father a child. This medicine may lower sperm counts. Talk to your health care professional or pharmacist for more information. Do not breast-feed an infant while taking this medicine or for 1 year after the last dose. What side effects may I notice from receiving this medicine? Side effects that you should report to your doctor or health care professional as soon as possible:  allergic reactions like skin rash, itching or hives, swelling of the face, lips, or tongue  feeling faint or lightheaded, falls  pain, tingling, numbness, or weakness in the legs  signs and symptoms of infection like fever or chills; cough; flu-like symptoms; sore throat  vaginal bleeding Side effects that usually do not require medical attention (report to your doctor or health care professional if they continue or are bothersome):  aches, pains  constipation  diarrhea  headache  hot flashes  nausea, vomiting  pain at site where injected  stomach pain This list may not describe all possible side effects. Call your doctor for medical advice about side effects. You may report side effects to FDA at 1-800-FDA-1088. Where should I keep my medicine? This drug is given in a hospital or clinic and will   not be stored at home. NOTE: This sheet is a summary. It may not cover all possible information. If you have questions about this medicine, talk to your doctor, pharmacist, or health care provider.  2020 Elsevier/Gold Standard (2018-01-24 11:34:41)  

## 2019-08-28 DIAGNOSIS — D485 Neoplasm of uncertain behavior of skin: Secondary | ICD-10-CM | POA: Diagnosis not present

## 2019-08-28 DIAGNOSIS — C44729 Squamous cell carcinoma of skin of left lower limb, including hip: Secondary | ICD-10-CM | POA: Diagnosis not present

## 2019-08-28 LAB — CANCER ANTIGEN 27.29: CA 27.29: 37.7 U/mL (ref 0.0–38.6)

## 2019-08-29 ENCOUNTER — Telehealth: Payer: Self-pay | Admitting: Hematology

## 2019-08-29 NOTE — Telephone Encounter (Signed)
I talk with patient regarding schedule  

## 2019-09-09 NOTE — Progress Notes (Deleted)
Cardiology Office Note   Date:  09/09/2019   ID:  Sherae, Karnopp 06/15/1928, MRN [id number]  PCP:  Lajean Manes, MD  Cardiologist:   No chief complaint on file.    History of Present Illness: Gloria [name] is a 85 y.o. female who presents  we are following for ongoing assessment and management of coronary artery disease, history of LAD, diagonal, and right coronary artery stenosis by cath in 2012, history of hypertension, breast cancer, and GERD. She is status post left mastectomy with radiation therapy and had been on chronic tamoxifen therapy in the past. Other history includes anxiety and depression.  I saw the patient last on 08/21/2019 via telemedicine at which time she had complaints of elevated blood pressure which were not improved with higher doses of losartan 50 mg daily and HCTZ 12.5 mg.  Her lower extremity edema was vastly improved but her blood pressures continue to run in the 150s and 160s.  She also appeared to have what looked like a spider bite on her lower left thigh which appear to be infected.  She stated that it began to bleed but now there is erythema and pain at the site.  On the last office visit I advised her to increase her losartan to 100 mg daily which she is to take in the morning and continue HCTZ 12.5 mg daily.  She was to have a follow-up BMET, and call us for any dizziness or lightheadedness or blood pressures less than 130/60.  For wound in her leg I started her on a very low dose of Zithromax 250 mg daily for 1 week and she was advised to follow-up with her PCP for reevaluation of her wound.  Unfortunately, on 08/23/2019 she presented to the emergency room with worsening symptoms of her wound concerning pain and edema.  She saw her PCP who had started her on doxycycline.  In the emergency room she had a stab incision, which allowed a probing and deloculated, with irrigation using saline.  She had bloody drainage of her wound.  The wound was left open.  She  was to follow-up with dermatologist  Past Medical History:  Diagnosis Date  . Anxiety   . Blood in urine   . CAD (coronary artery disease)    non obstructive by cath  . Cancer [facility])    breast- left  . Depression   . GERD (gastroesophageal reflux disease)   . Hypertension   . Knee fracture   . Personal history of radiation therapy 2017  . TR (tricuspid regurgitation)    mild by Echo 12/2008 EF >55%    Past Surgical History:  Procedure Laterality Date  . ABDOMINAL AORTAGRAM N/A 09/08/2011   Procedure: ABDOMINAL Maxcine Ham;  Surgeon: Troy Sine, MD;  Location: [facility] CATH LAB;  Service: Cardiovascular;  Laterality: N/A;  . ABDOMINAL HYSTERECTOMY    . BREAST EXCISIONAL BIOPSY Left 2017   X3  . BREAST SURGERY  08-30-10   mastectomy  . CARDIAC CATHETERIZATION  08/2011   20% LAD stenosis, 20% diagonal stenosis, 10-20% proximal dominant RCA stenosis  . COLON SURGERY    . LEFT HEART CATHETERIZATION WITH CORONARY ANGIOGRAM N/A 09/08/2011   Procedure: LEFT HEART CATHETERIZATION WITH CORONARY ANGIOGRAM;  Surgeon: Troy Sine, MD;  Location: [facility] CATH LAB;  Service: Cardiovascular;  Laterality: N/A;  . MASTECTOMY    . MINOR BREAST BIOPSY Left 11/25/2015   Procedure: MINOR EXCISION MASS LEFT CHEST WALL;  Surgeon: Autumn Messing III, MD;  Location: [place];  Service: General;  Laterality: Left;  . MINOR BREAST BIOPSY Left 07/17/2016   Procedure: EXCISION OF 2 LEFT CHEST WALL MASSES;  Surgeon: Autumn Messing III, MD;  Location: [place];  Service: General;  Laterality: Left;  . TONSILLECTOMY       Current Outpatient Medications  Medication Sig Dispense Refill  . acetaminophen (TYLENOL) 500 MG tablet Take 1,000 mg by mouth daily as needed for moderate pain. Reported on 03/10/2016    . ALPRAZolam (XANAX) 0.25 MG tablet Take 1/2 tablet by mouth in the morning and 1 tab at bedtime as needed 45 tablet 1  . aspirin 81 MG chewable tablet Chew 81 mg by mouth once a week.  Reported on 03/10/2016    . azithromycin (ZITHROMAX) 250 MG tablet Take one tablet by mouth daily 7 each 0  . CALCIUM-MAGNESIUM-VITAMIN D PO Take 1 tablet by mouth 2 (two) times daily.    . cetirizine (ZYRTEC) 10 MG tablet Take 10 mg by mouth as needed for allergies.     . diphenhydrAMINE (BENADRYL) 50 MG tablet Take 1 tablet (50 mg total) by mouth as directed. Take Benadryl 50 mg po 1 hour before CT scan. 1 tablet 0  . doxepin (SINEQUAN) 10 MG capsule Take 10 mg by mouth at bedtime.     . DULoxetine (CYMBALTA) 20 MG capsule Take 1 capsule (20 mg total) by mouth daily. 30 capsule 1  . fluconazole (DIFLUCAN) 100 MG tablet Take 1 tablet (100 mg total) by mouth daily. 1 tablet 0  . fluticasone (FLONASE) 50 MCG/ACT nasal spray Place 1 spray into both nostrils daily as needed for allergies.     Marland Kitchen guaiFENesin (MUCINEX) 600 MG 12 hr tablet Take 600 mg by mouth daily as needed for cough or to loosen phlegm. Reported on 03/28/2016    . hydrochlorothiazide (MICROZIDE) 12.5 MG capsule Take 1 capsule (12.5 mg total) by mouth daily. 90 capsule 3  . lidocaine-prilocaine (EMLA) cream Apply 1 application topically 2 (two) times daily as needed. 30 g 1  . losartan (COZAAR) 100 MG tablet Take 1 tablet (100 mg total) by mouth daily. 90 tablet 2  . nitroGLYCERIN (NITROSTAT) 0.4 MG SL tablet Place 1 tablet (0.4 mg total) under the tongue every 5 (five) minutes as needed for chest pain. <PLEASE MAKE APPOINTMENT> 25 tablet 3  . potassium chloride (K-DUR) 10 MEQ tablet Take 10 mEq by mouth as needed.     . pseudoephedrine-acetaminophen (TYLENOL SINUS) 30-500 MG TABS tablet Take 1 tablet by mouth every 4 (four) hours as needed.    . triamcinolone (KENALOG) 0.025 % cream Apply 1 application topically 2 (two) times daily. 30 g 0   No current facility-administered medications for this visit.     Allergies:   Contrast media [iodinated diagnostic agents], Penicillins, Adhesive [tape], Cephalexin, Ciprofloxacin, Demerol,  Meperidine hcl, Sulfa antibiotics, Sulfamethoxazole-trimethoprim, and Quinolones    Social History:  The patient  reports that she quit smoking about 27 years ago. She has never used smokeless tobacco. She reports current alcohol use of about 2.0 standard drinks of alcohol per week. She reports that she does not use drugs.   Family History:  The patient's family history includes Arthritis in her sister; Breast cancer in her maternal aunt and sister; Cancer in her brother, father, maternal grandmother, and sister; Dementia in her mother; Heart attack in her brother and brother; Heart disease in her sister; Sudden death in her brother.  ROS: All other systems are reviewed and negative. Unless otherwise mentioned in H&P    PHYSICAL EXAM: VS:  There were no vitals taken for this visit. , BMI There is no height or weight on file to calculate BMI. GEN: Well nourished, well developed, in no acute distress HEENT: normal Neck: no JVD, carotid bruits, or masses Cardiac: ***RRR; no murmurs, rubs, or gallops,no edema  Respiratory:  Clear to auscultation bilaterally, normal work of breathing GI: soft, nontender, nondistended, + BS MS: no deformity or atrophy Skin: warm and dry, no rash Neuro:  Strength and sensation are intact Psych: euthymic mood, full affect   EKG:  EKG {ACTION; IS/IS [id number] ordered today. The ekg ordered today demonstrates ***   Recent Labs: 08/27/2019: ALT 14; BUN 30; Creatinine, Ser 1.09; Hemoglobin 13.3; Platelets 157; Potassium 4.0; Sodium 137    Lipid Panel    Component Value Date/Time   CHOL  11/20/2007 0505    129        ATP III CLASSIFICATION:  <200     mg/dL   Desirable  200-239  mg/dL   Borderline High  >=240    mg/dL   High   TRIG 78 11/20/2007 0505   HDL 62 11/20/2007 0505   CHOLHDL 2.1 11/20/2007 0505   VLDL 16 11/20/2007 0505   LDLCALC  11/20/2007 0505    51        Total Cholesterol/HDL:CHD Risk Coronary Heart Disease Risk Table                      Men   Women  1/2 Average Risk   3.4   3.3      Wt Readings from Last 3 Encounters:  08/27/19 133 lb 3.2 oz (60.4 kg)  08/21/19 132 lb (59.9 kg)  06/27/19 134 lb (60.8 kg)      Other studies Reviewed: Additional studies/ records that were reviewed today include: ***. Review of the above records demonstrates: ***   ASSESSMENT AND PLAN:  1.  ***   Current medicines are reviewed at length with the patient today.    Labs/ tests ordered today include: *** Phill Myron. West Pugh, ANP, AACC   09/09/2019 12:18 PM    [facility] [place] [address] Office [phone] Fax [phone]  Notice: This dictation was prepared with Dragon dictation along with smaller phrase technology. Any transcriptional errors that result from this process are unintentional and may not be corrected upon review.

## 2019-09-10 ENCOUNTER — Ambulatory Visit: Payer: PPO | Admitting: Adult Health

## 2019-09-10 DIAGNOSIS — L97121 Non-pressure chronic ulcer of left thigh limited to breakdown of skin: Secondary | ICD-10-CM | POA: Diagnosis not present

## 2019-09-10 DIAGNOSIS — L0889 Other specified local infections of the skin and subcutaneous tissue: Secondary | ICD-10-CM | POA: Diagnosis not present

## 2019-09-10 DIAGNOSIS — Z85828 Personal history of other malignant neoplasm of skin: Secondary | ICD-10-CM | POA: Diagnosis not present

## 2019-09-10 NOTE — Progress Notes (Signed)
Virtual Visit via Video Note   This visit type was conducted due to national recommendations for restrictions regarding the COVID-19 Pandemic (e.g. social distancing) in an effort to limit this patient's exposure and mitigate transmission in our community.  Due to her co-morbid illnesses, this patient is at least at moderate risk for complications without adequate follow up.  This format is felt to be most appropriate for this patient at this time.  All issues noted in this document were discussed and addressed.  A limited physical exam was performed with this format.  Please refer to the patient's chart for her consent to telehealth for [facility].   Date:  09/10/2019   ID:  Gloria [name], DOB 1928/03/11, MRN [id number]  Patient Location: Home Provider Location: Home  PCP:  Lajean Manes, MD  Cardiologist: Dr. Claiborne Billings  Electrophysiologist:  None   Evaluation Performed:  Follow-Up Visit  Chief Complaint:  BP follow up  History of Present Illness:    Gloria [name] is a 85 y.o. female we are following for ongoing assessment and management of coronary artery disease, history of LAD, diagonal, and right coronary artery stenosis by cath in 2012, history of hypertension, breast cancer, and GERD. She is status post left mastectomy with radiation therapy and had been on chronic tamoxifen therapy in the past. Other history includes anxiety and depression.  On last office visit on 08/21/2019 the patient continued to have some hypertension and lower extremity edema and hypertension.  Due to lack of control of blood pressure I increased her losartan to 100 mg daily and added 12.5 mg of HCTZ.  She had only been taking 25 mg twice daily..  She also had what appeared to be a spider bite on her lower lip thigh on the left which appeared infected and she was started on doxycycline.  She did follow-up in the ED on 08/23/2019 and was found to have a for Elmyra Ricks which was lanced with withdrawal of blood  from site.  Blood pressure at the time of the ED visit was 131/96.  They have decreased the losartan to 75 mg daily as her BP got too low on 100 mg daily. This has kept her BP in normal range. There are no symptoms of dizziness or fatigue with lower dose.   She has been seen by dermatology, who has found her lesion to be a malignant melanoma. .  They are excising it next week.   The patient does not have symptoms concerning for COVID-19 infection (fever, chills, cough, or new shortness of breath).    Past Medical History:  Diagnosis Date  . Anxiety   . Blood in urine   . CAD (coronary artery disease)    non obstructive by cath  . Cancer [facility])    breast- left  . Depression   . GERD (gastroesophageal reflux disease)   . Hypertension   . Knee fracture   . Personal history of radiation therapy 2017  . TR (tricuspid regurgitation)    mild by Echo 12/2008 EF >55%   Past Surgical History:  Procedure Laterality Date  . ABDOMINAL AORTAGRAM N/A 09/08/2011   Procedure: ABDOMINAL Maxcine Ham;  Surgeon: Troy Sine, MD;  Location: [facility] CATH LAB;  Service: Cardiovascular;  Laterality: N/A;  . ABDOMINAL HYSTERECTOMY    . BREAST EXCISIONAL BIOPSY Left 2017   X3  . BREAST SURGERY  08-30-10   mastectomy  . CARDIAC CATHETERIZATION  08/2011   20% LAD stenosis, 20% diagonal stenosis, 10-20%  proximal dominant RCA stenosis  . COLON SURGERY    . LEFT HEART CATHETERIZATION WITH CORONARY ANGIOGRAM N/A 09/08/2011   Procedure: LEFT HEART CATHETERIZATION WITH CORONARY ANGIOGRAM;  Surgeon: Troy Sine, MD;  Location: [facility] CATH LAB;  Service: Cardiovascular;  Laterality: N/A;  . MASTECTOMY    . MINOR BREAST BIOPSY Left 11/25/2015   Procedure: MINOR EXCISION MASS LEFT CHEST WALL;  Surgeon: Autumn Messing III, MD;  Location: [place];  Service: General;  Laterality: Left;  . MINOR BREAST BIOPSY Left 07/17/2016   Procedure: EXCISION OF 2 LEFT CHEST WALL MASSES;  Surgeon: Autumn Messing III, MD;  Location:  [place];  Service: General;  Laterality: Left;  . TONSILLECTOMY       No outpatient medications have been marked as taking for the 09/11/19 encounter (Appointment) with Lendon Colonel, NP.     Allergies:   Contrast media [iodinated diagnostic agents], Penicillins, Adhesive [tape], Cephalexin, Ciprofloxacin, Demerol, Meperidine hcl, Sulfa antibiotics, Sulfamethoxazole-trimethoprim, and Quinolones   Social History   Tobacco Use  . Smoking status: Former Smoker    Quit date: 11/15/1991    Years since quitting: 27.8  . Smokeless tobacco: Never Used  Substance Use Topics  . Alcohol use: Yes    Alcohol/week: 2.0 standard drinks    Types: 2 Glasses of wine per week    Comment: 1 glass per week  . Drug use: No     Family Hx: The patient's family history includes Arthritis in her sister; Breast cancer in her maternal aunt and sister; Cancer in her brother, father, maternal grandmother, and sister; Dementia in her mother; Heart attack in her brother and brother; Heart disease in her sister; Sudden death in her brother.  ROS:   Please see the history of present illness.    All other systems reviewed and are negative.   Prior CV studies:   The following studies were reviewed today: Echocardiogram 08/22/2016 Left ventricle: The cavity size was normal. Wall thickness was normal. Systolic function was normal. The estimated ejection fraction was in the range of 60% to 65%. Wall motion was normal; there were no regional wall motion abnormalities. Doppler parameters are consistent with abnormal left ventricular relaxation (grade 1 diastolic dysfunction). - Left atrium: The atrium was mildly dilated.  Labs/Other Tests and Data Reviewed:    EKG:  No ECG reviewed.  Recent Labs: 08/27/2019: ALT 14; BUN 30; Creatinine, Ser 1.09; Hemoglobin 13.3; Platelets 157; Potassium 4.0; Sodium 137   Recent Lipid Panel Lab Results  Component Value Date/Time   CHOL   11/20/2007 05:05 AM    129        ATP III CLASSIFICATION:  <200     mg/dL   Desirable  200-239  mg/dL   Borderline High  >=240    mg/dL   High   TRIG 78 11/20/2007 05:05 AM   HDL 62 11/20/2007 05:05 AM   CHOLHDL 2.1 11/20/2007 05:05 AM   LDLCALC  11/20/2007 05:05 AM    51        Total Cholesterol/HDL:CHD Risk Coronary Heart Disease Risk Table                     Men   Women  1/2 Average Risk   3.4   3.3    Wt Readings from Last 3 Encounters:  08/27/19 133 lb 3.2 oz (60.4 kg)  08/21/19 132 lb (59.9 kg)  06/27/19 134 lb (60.8 kg)  Objective:    Vital Signs:  There were no vitals taken for this visit.   VITAL SIGNS:  reviewed GEN:  no acute distress EYES:  sclerae anicteric, EOMI - Extraocular Movements Intact RESPIRATORY:  normal respiratory effort, symmetric expansion NEURO:  alert and oriented x 3, no obvious focal deficit PSYCH:  normal affect  ASSESSMENT & PLAN:    1. Hypertension: Blood pressure medications have been adjusted.  Higher dose of losartan at 100 mg was too high for her causing her to have some dizziness, therefore they decreased it to 75 mg (they have 50 mg tablet, and are now taking 1-1/2 tablets a day) blood pressure is much better, she is tolerating the medication dosage without dizziness or fatigue.  We will continue her on losartan 75 mg daily.  She will follow-up with [place] in 6 months.  If blood pressure becomes lower will discontinue HCTZ on follow-up or in the interim between appointments.  2.  Skin cancer: Lesion of her left upper thigh was found to be cancerous and is being followed by dermatologist.  Excision is planned next week.  3.  Coronary artery disease: Continue medical management and secondary prevention.    COVID-19 Education: The signs and symptoms of COVID-19 were discussed with the patient and how to seek care for testing (follow up with PCP or arrange E-visit).  The importance of social distancing was discussed today.  Time:    Today, I have spent 15 minutes with the patient with telehealth technology discussing the above problems.     Medication Adjustments/Labs and Tests Ordered: Current medicines are reviewed at length with the patient today.  Concerns regarding medicines are outlined above.   Tests Ordered: No orders of the defined types were placed in this encounter.   Medication Changes: No orders of the defined types were placed in this encounter.   Disposition:  Follow up 6 months Signed, Phill Myron. West Pugh, ANP, AACC  09/10/2019 4:09 PM    [facility]

## 2019-09-11 ENCOUNTER — Encounter: Payer: Self-pay | Admitting: Adult Health

## 2019-09-11 ENCOUNTER — Telehealth (INDEPENDENT_AMBULATORY_CARE_PROVIDER_SITE_OTHER): Payer: PPO | Admitting: Adult Health

## 2019-09-11 VITALS — BP 135/84 | HR 85 | Ht 60.0 in | Wt 132.0 lb

## 2019-09-11 DIAGNOSIS — C44709 Unspecified malignant neoplasm of skin of left lower limb, including hip: Secondary | ICD-10-CM

## 2019-09-11 DIAGNOSIS — I2510 Atherosclerotic heart disease of native coronary artery without angina pectoris: Secondary | ICD-10-CM

## 2019-09-11 DIAGNOSIS — I10 Essential (primary) hypertension: Secondary | ICD-10-CM

## 2019-09-11 NOTE — Patient Instructions (Signed)
Medication Instructions:  Continue current medications  *If you need a refill on your cardiac medications before your next appointment, please call your pharmacy*  Lab Work: None Ordered  Testing/Procedures: None Ordered  Follow-Up: At [facility], you and your health needs are our priority.  As part of our continuing mission to provide you with exceptional heart care, we have created designated Provider Care Teams.  These Care Teams include your primary Cardiologist (physician) and Advanced Practice Providers (APPs -  Physician Assistants and Nurse Practitioners) who all work together to provide you with the care you need, when you need it.  Your next appointment:   6 months  The format for your next appointment:   In Person  Provider:   Shelva Majestic, MD

## 2019-09-17 DIAGNOSIS — C44729 Squamous cell carcinoma of skin of left lower limb, including hip: Secondary | ICD-10-CM | POA: Diagnosis not present

## 2019-09-17 DIAGNOSIS — L97121 Non-pressure chronic ulcer of left thigh limited to breakdown of skin: Secondary | ICD-10-CM | POA: Diagnosis not present

## 2019-09-23 ENCOUNTER — Telehealth: Payer: Self-pay | Admitting: Hematology

## 2019-09-23 NOTE — Telephone Encounter (Signed)
Faxed records to [facility] release id [id number]

## 2019-09-26 ENCOUNTER — Other Ambulatory Visit: Payer: Self-pay

## 2019-09-26 ENCOUNTER — Inpatient Hospital Stay: Payer: PPO | Attending: Hematology

## 2019-09-26 VITALS — BP 141/78 | HR 85 | Temp 98.0°F | Resp 17

## 2019-09-26 DIAGNOSIS — C50112 Malignant neoplasm of central portion of left female breast: Secondary | ICD-10-CM | POA: Insufficient documentation

## 2019-09-26 DIAGNOSIS — Z170 Estrogen receptor positive status [ER+]: Secondary | ICD-10-CM | POA: Diagnosis not present

## 2019-09-26 DIAGNOSIS — Z79818 Long term (current) use of other agents affecting estrogen receptors and estrogen levels: Secondary | ICD-10-CM | POA: Insufficient documentation

## 2019-09-26 MED ORDER — FULVESTRANT 250 MG/5ML IM SOLN
500.0000 mg | INTRAMUSCULAR | Status: DC
  Administered 2019-09-26: 500 mg via INTRAMUSCULAR

## 2019-09-26 NOTE — Patient Instructions (Signed)
Fulvestrant injection What is this medicine? FULVESTRANT (ful VES trant) blocks the effects of estrogen. It is used to treat breast cancer. This medicine may be used for other purposes; ask your health care provider or pharmacist if you have questions. COMMON BRAND NAME(S): FASLODEX What should I tell my health care provider before I take this medicine? They need to know if you have any of these conditions:  bleeding disorders  liver disease  low blood counts, like low white cell, platelet, or red cell counts  an unusual or allergic reaction to fulvestrant, other medicines, foods, dyes, or preservatives  pregnant or trying to get pregnant  breast-feeding How should I use this medicine? This medicine is for injection into a muscle. It is usually given by a health care professional in a hospital or clinic setting. Talk to your pediatrician regarding the use of this medicine in children. Special care may be needed. Overdosage: If you think you have taken too much of this medicine contact a poison control center or emergency room at once. NOTE: This medicine is only for you. Do not share this medicine with others. What if I miss a dose? It is important not to miss your dose. Call your doctor or health care professional if you are unable to keep an appointment. What may interact with this medicine?  medicines that treat or prevent blood clots like warfarin, enoxaparin, dalteparin, apixaban, dabigatran, and rivaroxaban This list may not describe all possible interactions. Give your health care provider a list of all the medicines, herbs, non-prescription drugs, or dietary supplements you use. Also tell them if you smoke, drink alcohol, or use illegal drugs. Some items may interact with your medicine. What should I watch for while using this medicine? Your condition will be monitored carefully while you are receiving this medicine. You will need important blood work done while you are taking  this medicine. Do not become pregnant while taking this medicine or for at least 1 year after stopping it. Women of child-bearing potential will need to have a negative pregnancy test before starting this medicine. Women should inform their doctor if they wish to become pregnant or think they might be pregnant. There is a potential for serious side effects to an unborn child. Men should inform their doctors if they wish to father a child. This medicine may lower sperm counts. Talk to your health care professional or pharmacist for more information. Do not breast-feed an infant while taking this medicine or for 1 year after the last dose. What side effects may I notice from receiving this medicine? Side effects that you should report to your doctor or health care professional as soon as possible:  allergic reactions like skin rash, itching or hives, swelling of the face, lips, or tongue  feeling faint or lightheaded, falls  pain, tingling, numbness, or weakness in the legs  signs and symptoms of infection like fever or chills; cough; flu-like symptoms; sore throat  vaginal bleeding Side effects that usually do not require medical attention (report to your doctor or health care professional if they continue or are bothersome):  aches, pains  constipation  diarrhea  headache  hot flashes  nausea, vomiting  pain at site where injected  stomach pain This list may not describe all possible side effects. Call your doctor for medical advice about side effects. You may report side effects to FDA at 1-800-FDA-1088. Where should I keep my medicine? This drug is given in a hospital or clinic and will   not be stored at home. NOTE: This sheet is a summary. It may not cover all possible information. If you have questions about this medicine, talk to your doctor, pharmacist, or health care provider.  2020 Elsevier/Gold Standard (2018-01-24 11:34:41)  

## 2019-10-21 ENCOUNTER — Other Ambulatory Visit: Payer: Self-pay | Admitting: Nurse Practitioner

## 2019-10-21 DIAGNOSIS — Z170 Estrogen receptor positive status [ER+]: Secondary | ICD-10-CM

## 2019-10-21 DIAGNOSIS — C50112 Malignant neoplasm of central portion of left female breast: Secondary | ICD-10-CM

## 2019-10-22 ENCOUNTER — Other Ambulatory Visit: Payer: Self-pay | Admitting: Nurse Practitioner

## 2019-10-22 DIAGNOSIS — C50112 Malignant neoplasm of central portion of left female breast: Secondary | ICD-10-CM

## 2019-10-22 MED ORDER — ALPRAZOLAM 0.25 MG PO TABS: ORAL_TABLET | ORAL | 1 refills | Status: DC

## 2019-10-27 ENCOUNTER — Inpatient Hospital Stay: Payer: PPO | Admitting: Hematology

## 2019-10-27 ENCOUNTER — Inpatient Hospital Stay: Payer: PPO

## 2019-11-06 ENCOUNTER — Telehealth: Payer: Self-pay

## 2019-11-06 ENCOUNTER — Telehealth: Payer: Self-pay | Admitting: Hematology

## 2019-11-06 NOTE — Telephone Encounter (Signed)
Called patient to let her know that her PET scan is scheduled for Wednesday 1/13 at 3:00 at [facility], she was given instructions to not eat anything after 9:00 am, sips of water only, arrive by 2:45.  She verbalized an understanding of the above instructions.  Explained will set up phone visit with [name] to discuss the results for few days after the scan.

## 2019-11-06 NOTE — Telephone Encounter (Signed)
Scheduled per 1/7 sch msg. Called and spoke with pt, confirmed 1/15 appt 

## 2019-11-10 ENCOUNTER — Telehealth: Payer: Self-pay

## 2019-11-10 NOTE — Telephone Encounter (Signed)
Patient calls stating she is scheduled for PET scan on 1/13 and phone visit with [name] on 1/15.  She would like to cancel these and postpone for a month or so.  Cancelled appointments and instructed her to call back when she is ready to reschedule.

## 2019-11-12 ENCOUNTER — Encounter (HOSPITAL_COMMUNITY): Payer: PPO

## 2019-11-14 ENCOUNTER — Other Ambulatory Visit: Payer: PPO

## 2019-11-14 ENCOUNTER — Ambulatory Visit: Payer: PPO

## 2019-11-14 ENCOUNTER — Ambulatory Visit: Payer: PPO | Admitting: Hematology

## 2019-12-21 DIAGNOSIS — H66002 Acute suppurative otitis media without spontaneous rupture of ear drum, left ear: Secondary | ICD-10-CM | POA: Diagnosis not present

## 2019-12-21 DIAGNOSIS — I10 Essential (primary) hypertension: Secondary | ICD-10-CM | POA: Diagnosis not present

## 2019-12-27 ENCOUNTER — Ambulatory Visit: Payer: PPO

## 2019-12-31 ENCOUNTER — Telehealth: Payer: Self-pay | Admitting: *Deleted

## 2020-01-19 ENCOUNTER — Other Ambulatory Visit: Payer: Self-pay | Admitting: Nurse Practitioner

## 2020-01-19 DIAGNOSIS — Z170 Estrogen receptor positive status [ER+]: Secondary | ICD-10-CM

## 2020-01-19 DIAGNOSIS — C50112 Malignant neoplasm of central portion of left female breast: Secondary | ICD-10-CM

## 2020-01-23 ENCOUNTER — Telehealth: Payer: Self-pay

## 2020-01-23 NOTE — Telephone Encounter (Signed)
Ms Brener called requesting xanax refill.  Message sent to [name].    Attempted to call Ms Zasada to let her know xanax was refilled.  No answer on either cell phone or home phone and unable to leave message

## 2020-01-26 DIAGNOSIS — I10 Essential (primary) hypertension: Secondary | ICD-10-CM | POA: Diagnosis not present

## 2020-01-26 DIAGNOSIS — F411 Generalized anxiety disorder: Secondary | ICD-10-CM | POA: Diagnosis not present

## 2020-01-26 DIAGNOSIS — M25562 Pain in left knee: Secondary | ICD-10-CM | POA: Diagnosis not present

## 2020-01-26 DIAGNOSIS — M25561 Pain in right knee: Secondary | ICD-10-CM | POA: Diagnosis not present

## 2020-02-03 DIAGNOSIS — H903 Sensorineural hearing loss, bilateral: Secondary | ICD-10-CM | POA: Diagnosis not present

## 2020-02-03 DIAGNOSIS — H6123 Impacted cerumen, bilateral: Secondary | ICD-10-CM | POA: Diagnosis not present

## 2020-02-11 DIAGNOSIS — M1712 Unilateral primary osteoarthritis, left knee: Secondary | ICD-10-CM | POA: Diagnosis not present

## 2020-02-11 DIAGNOSIS — M1711 Unilateral primary osteoarthritis, right knee: Secondary | ICD-10-CM | POA: Diagnosis not present

## 2020-02-11 DIAGNOSIS — M170 Bilateral primary osteoarthritis of knee: Secondary | ICD-10-CM | POA: Diagnosis not present

## 2020-02-13 DIAGNOSIS — Z79899 Other long term (current) drug therapy: Secondary | ICD-10-CM | POA: Diagnosis not present

## 2020-04-21 ENCOUNTER — Other Ambulatory Visit: Payer: Self-pay | Admitting: Geriatric Medicine

## 2020-04-21 ENCOUNTER — Ambulatory Visit
Admission: RE | Admit: 2020-04-21 | Discharge: 2020-04-21 | Disposition: A | Discharge status: Home / Self Care | Payer: PPO | Source: Ambulatory Visit | Attending: Geriatric Medicine | Admitting: Geriatric Medicine

## 2020-04-21 DIAGNOSIS — R059 Cough, unspecified: Secondary | ICD-10-CM

## 2020-04-21 DIAGNOSIS — R05 Cough: Secondary | ICD-10-CM | POA: Diagnosis not present

## 2020-04-27 ENCOUNTER — Telehealth: Payer: Self-pay

## 2020-04-27 DIAGNOSIS — N3020 Other chronic cystitis without hematuria: Secondary | ICD-10-CM | POA: Diagnosis not present

## 2020-04-27 NOTE — Telephone Encounter (Signed)
Ms Costanza called stating she is coughing and "the scar keeps cutting off my breathing".  She states she has a cough as well.  She saw her PCP last week who did a CXR.  She is requesting an appt with [name].  Aptt made for 04/29/2020

## 2020-04-27 NOTE — Progress Notes (Signed)
Gloria [name]   [phone] [phone]   Clinic Follow up Note   Patient Care Team: Lajean Manes, MD as PCP - Larue, [place], MD as Consulting Physician (General Surgery) Truitt Merle, MD as Consulting Physician (Hematology) [name], MD as Consulting Physician (Radiation Oncology) Sylvan Cheese, NP as Nurse Practitioner (Hematology and Oncology) Karen Kays, NP as Nurse Practitioner (Nurse Practitioner)  Date of Service:  04/29/2020  CHIEF COMPLAINT: F/uinvasive lobular carcinoma of the left breast  SUMMARY OF ONCOLOGIC HISTORY: Oncology History Overview Note  Cancer of central portion of left female breast [facility])   Staging form: Breast, AJCC 7th Edition     Pathologic stage from 08/04/2010: Stage IIB (T2, N1a, cM0) - Signed by Truitt Merle, MD on 12/09/2015     Cancer of central portion of left female breast ([place])  08/03/2010 Mammogram    left nipple retraction, and the palpable mass subareolar left breast at the 6:00 position. Ultrasound confirmed the presence of a mass measuring 2.6 x 1.7 x 2.4 cm   08/04/2010 Initial Biopsy   left breast mass biopsy showed an invasive mammary carcinoma with lobular features. ER 95%, PR 97%, Ki-67 17%, HER-2/neu (-)   08/04/2010 Pathologic Stage   Stage IIB: T2 N1a   08/11/2010 Imaging   left breast mass 4.6 x 4.2 x 2.3 cm with enhancement distortion extending to the nipple retraction. There is questionable anterior mediastinal lymph node seen   07/2010 - 09/03/2010 Anti-estrogen oral therapy   neoadjuvant letrozole    09/03/2010 Surgery    left breast mastectomy with sentinel node biopsy on 09/03/2010.   08/2010 - 01/2015 Anti-estrogen oral therapy   Tamoxifen, pt stopped on her own    12/03/2010 - 01/23/2011 Radiation Therapy   left chest wall and axilla radiaiton    10/19/2015 Progression   Left chest wall recurrence, s/p resection on 11/25/2015 with positive margins (IL[facility])      12/10/2015 - 05/31/2017 Anti-estrogen oral therapy   anastrozole '1mg'$  daily, which was switched to Tamoxifen on 06/30/2016 due to persistent disease.   01/03/2016 - 03/03/2016 Radiation Therapy   Radiation to left chest wall recurrence (12/06/2015-02/04/2016 - 44 Gy), pt progressed through the radiation, and had additional 10 fractions of radiation (02/21/16-03/03/2016 - total 70 Gy to the left chest wall)   07/14/2016 PET scan   PET 07/14/2016 IMPRESSION: 1. No findings of recurrent malignancy. 2. Radiation fibrosis in the lingula. A small left axillary lymph node is not hypermetabolic and only 6 mm in diameter. This is in the vicinity of the prior axillary cyst. 3. Pelvic floor laxity.   07/17/2016 Surgery   Chest wall soft tissue resection for local recurrence   07/17/2016 Pathology Results   Left chest wall two soft tissue resection both showed invasive lobular carcinoma, margins were positive. ER 95% positive, PR 60% positive, HER-2 negative.   07/27/2016 Imaging   CT chest, abdomen and pelvis wo contrast  07/27/2016 IMPRESSION: Sigmoid diverticulosis without acute diverticulitis. No bowel obstruction or acute inflammation. Degenerative disc disease and facet arthropathy of the lower lumbar spine with slight grade 1 anterolisthesis of L4 on L5. Stable appearing simple left-sided renal cysts.   03/01/2017 Mammogram   Mammogram 03/01/2017 IMPRESSION: No mammographic evidence of malignancy. A result letter of this screening mammogram will be mailed directly to the patient. RECOMMENDATION: Screening mammogram in one year   04/10/2017 Imaging   PET IMPRESSION: 1. Stable exam.  No findings of recurrent malignancy. 2. Changes  of external beam radiation again noted within the lingula. 3. Small left axillary lymph node exhibits mild increased uptake. Unchanged from previous exam.   06/07/2017 -  Anti-estrogen oral therapy   Stopped Tamoxifen and Began monthly Faslodex IM injection on 06/07/17  -Held  after 09/26/19 due to lost f/u. Restart with loading dose on 04/29/20.    09/19/2017 Imaging   CT CAP WO Contrast 09/19/17 IMPRESSION: 1. Surgical changes from a left mastectomy without CT findings to suggest recurrent chest wall tumor or axillary lymphadenopathy. 2. No findings to suggest metastatic disease involving the neck, chest, abdomen or pelvis or osseous structures. 3. Stable radiation changes involving the lingula.    09/19/2017 Imaging   Bone Scan Whole Body  IMPRESSION: No definite scintigraphic evidence of osseous metastatic disease. Scattered degenerative type uptake as above.   03/11/2018 PET scan   IMPRESSION:  1. No evidence of recurrent disease. 2.  Aortic atherosclerosis    09/03/2018 PET scan   09/03/2018 PET Scan IMPRESSION: 1. No findings to suggest locally recurrent disease or metastatic disease in the neck, chest, abdomen or pelvis. 2. Hepatic steatosis. 3. Colonic diverticulosis without evidence of acute diverticulitis at this time. 4. Aortic atherosclerosis. 5. Additional incidental findings, as above.   11/25/2018 Imaging   DG Ribs Unilateral W/Chest Left  IMPRESSION: Stable left lingular scarring. Normal left ribs. No acute cardiopulmonary abnormality seen.    04/04/2019 PET scan   PET  IMPRESSION: 1. No evidence of hypermetabolic recurrent or metastatic disease. 2. Hepatic steatosis. 3.  Aortic Atherosclerosis (ICD10-I70.0).      CURRENT THERAPY:  monthly Faslodex injection started on 06/07/17. Held after 09/26/19 due to lost f/u. Restart with loading dose on 04/29/20.    INTERVAL HISTORY:  Gloria [name] is here for a follow up of left breast cancer. She was last seen by me 9 months ago. She presents to the clinic alone. Her daughter was called to be included in the visit today. She notes her daughter sees her everyday. She notes she remembers being told by me to hold on PET scan a few weeks due to [place]. When she called our office back she was  told waiting for PET scan 6 months after her COVID19 vaccine. She also notes because her injections were not mentioned, she did not come in for them.  She notes chest wall pain near left lateral sternum (4/10). She feels this is effecting her breathing. She notes 2.5 weeks ago she had a rattling cough and feel choking. She had clear phlegm from this. She was seen by Dr Felipa Eth who gave her cough medication [place]. After medication her cough came back. For her pain she did not try Cymbalta I previously called in. She is willing to try. She notes she is already on an antidepressant Doxepin which she has been on for 25 years.     REVIEW OF SYSTEMS:   Constitutional: Denies fevers, chills or abnormal weight loss Eyes: Denies blurriness of vision Ears, nose, mouth, throat, and face: Denies mucositis or sore throat Respiratory: Denies dyspnea or wheezes (+) Progressive cough with clear phlegm Cardiovascular: Denies palpitation, chest discomfort or lower extremity swelling Gastrointestinal:  Denies nausea, heartburn or change in bowel habits Skin: Denies abnormal skin rashes Lymphatics: Denies new lymphadenopathy or easy bruising Neurological:Denies numbness, tingling or new weaknesses Behavioral/Psych: Mood is stable, no new changes  Breast: (+) Left chest wall pain near sternum.  All other systems were reviewed with the patient and are negative.  MEDICAL  HISTORY:  Past Medical History:  Diagnosis Date  . Anxiety   . Blood in urine   . CAD (coronary artery disease)    non obstructive by cath  . Cancer [facility])    breast- left  . Depression   . GERD (gastroesophageal reflux disease)   . Hypertension   . Knee fracture   . Personal history of radiation therapy 2017  . TR (tricuspid regurgitation)    mild by Echo 12/2008 EF >55%    SURGICAL HISTORY: Past Surgical History:  Procedure Laterality Date  . ABDOMINAL AORTAGRAM N/A 09/08/2011   Procedure: ABDOMINAL Maxcine Ham;  Surgeon: Troy Sine, MD;  Location: [facility] CATH LAB;  Service: Cardiovascular;  Laterality: N/A;  . ABDOMINAL HYSTERECTOMY    . BREAST EXCISIONAL BIOPSY Left 2017   X3  . BREAST SURGERY  08-30-10   mastectomy  . CARDIAC CATHETERIZATION  08/2011   20% LAD stenosis, 20% diagonal stenosis, 10-20% proximal dominant RCA stenosis  . COLON SURGERY    . LEFT HEART CATHETERIZATION WITH CORONARY ANGIOGRAM N/A 09/08/2011   Procedure: LEFT HEART CATHETERIZATION WITH CORONARY ANGIOGRAM;  Surgeon: Troy Sine, MD;  Location: [facility] CATH LAB;  Service: Cardiovascular;  Laterality: N/A;  . MASTECTOMY    . MINOR BREAST BIOPSY Left 11/25/2015   Procedure: MINOR EXCISION MASS LEFT CHEST WALL;  Surgeon: Autumn Messing III, MD;  Location: [place];  Service: General;  Laterality: Left;  . MINOR BREAST BIOPSY Left 07/17/2016   Procedure: EXCISION OF 2 LEFT CHEST WALL MASSES;  Surgeon: Autumn Messing III, MD;  Location: [place];  Service: General;  Laterality: Left;  . TONSILLECTOMY      I have reviewed the social history and family history with the patient and they are unchanged from previous note.  ALLERGIES:  is allergic to contrast media [iodinated diagnostic agents], penicillins, adhesive [tape], cephalexin, ciprofloxacin, demerol, meperidine hcl, sulfa antibiotics, sulfamethoxazole-trimethoprim, and quinolones.  MEDICATIONS:  Current Outpatient Medications  Medication Sig Dispense Refill  . losartan (COZAAR) 50 MG tablet Take 1 tablet by mouth in the morning.     . senna (SENOKOT) 8.6 MG tablet Take 1 tablet by mouth every other day.    Marland Kitchen acetaminophen (TYLENOL) 500 MG tablet Take 1,000 mg by mouth daily as needed for moderate pain. Reported on 03/10/2016    . ALPRAZolam (XANAX) 0.25 MG tablet TAKE 1/2 (ONE-HALF) TABLET BY MOUTH IN THE MORNING AND 1 AT BEDTIME AS NEEDED 30 tablet 0  . amLODipine (NORVASC) 2.5 MG tablet amlodipine 2.5 mg tablet  TK 1 T PO QD    . aspirin 81 MG chewable tablet Chew 81  mg by mouth once a week. Reported on 03/10/2016    . CALCIUM-MAGNESIUM-VITAMIN D PO Take 1 tablet by mouth 2 (two) times daily.    Marland Kitchen doxepin (SINEQUAN) 10 MG capsule Take 10 mg by mouth at bedtime.     Marland Kitchen doxycycline (VIBRA-TABS) 100 MG tablet Take 100 mg by mouth 2 (two) times daily.    . DULoxetine (CYMBALTA) 20 MG capsule Take 1 capsule (20 mg total) by mouth daily. 30 capsule 1  . fluticasone (FLONASE) 50 MCG/ACT nasal spray Place 1 spray into both nostrils daily as needed for allergies.     Marland Kitchen guaiFENesin (MUCINEX) 600 MG 12 hr tablet Take 600 mg by mouth daily as needed for cough or to loosen phlegm. Reported on 03/28/2016    . hydrochlorothiazide (MICROZIDE) 12.5 MG capsule Take 1 capsule (12.5 mg  total) by mouth daily. 90 capsule 3  . losartan (COZAAR) 25 MG tablet 25 mg at bedtime.     . nitroGLYCERIN (NITROSTAT) 0.4 MG SL tablet Place 1 tablet (0.4 mg total) under the tongue every 5 (five) minutes as needed for chest pain. <PLEASE MAKE APPOINTMENT> 25 tablet 3  . omeprazole (PRILOSEC) 20 MG capsule omeprazole 20 mg capsule,delayed release    . pseudoephedrine-acetaminophen (TYLENOL SINUS) 30-500 MG TABS tablet Take 1 tablet by mouth every 4 (four) hours as needed.    . triamcinolone (KENALOG) 0.025 % cream Apply 1 application topically 2 (two) times daily. 30 g 0   No current facility-administered medications for this visit.   Facility-Administered Medications Ordered in Other Visits  Medication Dose Route Frequency Provider Last Rate Last Admin  . fulvestrant (FASLODEX) injection 500 mg  500 mg Intramuscular Q30 days Truitt Merle, MD   500 mg at 04/29/20 1139    PHYSICAL EXAMINATION: ECOG PERFORMANCE STATUS: 2 - Symptomatic, <50% confined to bed  Vitals:   04/29/20 0943  BP: 120/78  Pulse: 73  Resp: 18  Temp: 98.1 F (36.7 C)  SpO2: 100%   Filed Weights   04/29/20 0943  Weight: 131 lb 6.4 oz (59.6 kg)    GENERAL:alert, no distress and comfortable SKIN: skin color, texture,  turgor are normal, no rashes or significant lesions EYES: normal, Conjunctiva are pink and non-injected, sclera clear  NECK: supple, thyroid normal size, non-tender, without nodularity LYMPH:  no palpable lymphadenopathy in the cervical, axillary  LUNGS: clear to auscultation and percussion with normal breathing effort HEART: regular rate & rhythm and no murmurs and no lower extremity edema ABDOMEN:abdomen soft, non-tender and normal bowel sounds Musculoskeletal:no cyanosis of digits and no clubbing  NEURO: alert & oriented x 3 with fluent speech, no focal motor/sensory deficits BREAST: (+) S/p left mastectomy: surgical incision healed well (+) Lump along mastectomy incision, mostly stable.   LABORATORY DATA:  I have reviewed the data as listed CBC Latest Ref Rng & Units 04/29/2020 08/27/2019 06/27/2019  WBC 4.0 - 10.5 K/uL 3.8(L) 5.4 4.6  Hemoglobin 12.0 - 15.0 g/dL 11.9(L) 13.3 12.8  Hematocrit 36 - 46 % 36.9 39.9 39.4  Platelets 150 - 400 K/uL 159 157 148(L)     CMP Latest Ref Rng & Units 04/29/2020 08/27/2019 06/27/2019  Glucose 70 - 99 mg/dL 80 86 124(H)  BUN 8 - 23 mg/dL 23 30(H) 15  Creatinine 0.44 - 1.00 mg/dL 0.99 1.09(H) 0.89  Sodium 135 - 145 mmol/L 142 137 142  Potassium 3.5 - 5.1 mmol/L 3.7 4.0 3.8  Chloride 98 - 111 mmol/L 105 100 106  CO2 22 - 32 mmol/L _0 Calcium 8.9 - 10.3 mg/dL 9.3 9.3 9.0  Total Protein 6.5 - 8.1 g/dL 6.4(L) 6.6 6.4(L)  Total Bilirubin 0.3 - 1.2 mg/dL 0.5 0.5 0.4  Alkaline Phos 38 - 126 U/L 88 88 87  AST 15 - 41 U/L _1 ALT 0 - 44 U/L _2 RADIOGRAPHIC STUDIES: I have personally reviewed the radiological images as listed and agreed with the findings in the report. No results found.   ASSESSMENT & PLAN:  Gloria [name] is a 86 y.o. female with    1. Cancer of the central portion of left female breast, Stage IIB, T2 N1 invasive lobular carcinoma, grade 2, ER 95%, PR 97%, Ki-67 17%, HER-2/neu no amplification, diagnosed in  07/2010, local chest wall recurrence in  09/2015  -She wasdiagnosed in 10/2011but hadchest wall local recurrence in 09/2015.She was on adjuvant tamoxifen for 4.5 years, stopped on her own anddeveloped local recurrence 8 months after she stopped tamoxifen. -She has completed chest wall radiation, however developedlocal recurrence again andunderwent a second chest wall surgery for two soft tissue resection in 06/2016. Unfortunately the surgicalmargins were positive. Dr. Marlou Starks did not offer more surgery because complete surgical resection was unlikely. Patient was also not in favor of more extensive surgery. -She has been onmonthly fulvestrant injection starting 8/9/18due to clinical suspicion for disease progress.She has tolerated Faslodex injections well overall.  -She lost f/u and injections after 09/26/19 due to misunderstandings and miscommunications. I discussed with her and her daughter that I recommend she continue monthly. I will notify both of them about any recommended changes in treatment. She and her daughter voiced good understanding.  -She notes having left chest wall pain. She also notes productive cough in the past 2-3 weeks. Her exam shows stable left chest wall nodule at her mastectomy incision. I recommend repeating PET scan in the 2-3 weeks for further evaluation. She is agreeable.  -I also recommend restarting her monthly Faslodex injection today, but giving she was off for so long will start at loading dose every 2 weeks for the first 3 doses, she is agreeable. Labs reviewed, CBC and CMP WNL except WBC 3.8, Hg 11.9, protein 6.4. Overall adequate to proceed.  -She is interested in grant from [facility] to help pay for her injections again. I will check with our financial office.  If she could not financial assistance available now, will postpone her injections -F/u in 4 weeks with PET scan results.    2. Left chest wall pain  -Secondary to breast surgery -For pain I will call in  Cymbalta (04/29/20). She is already on antidepressant Doxepin so I recommend she f/u with Dr Felipa Eth about weaning off Doxepin over 1-2 weeks. Due to drug interaction, she should not overlap the medications for more than 1 week. She is agreeable.   3. HTN, CAD  -She'll continue follow-up with her primary care physician and cardiologist -I previously encouraged her to monitor her BP at home and if remains elevated she should see her PCP. She is on Losartan.  4. Osteoporosis  -Her last DEXA scan in 06/2012 showed osteoporosis  -I previously encouraged her to continue calcium and Vit D -I previously encouraged her to repeat DEXA at her PCP office   5. Goal of care discussion  -The patient understands the goal of care is palliative. -she is full code for now    Plan -I called in Cymbalta today  -due to her high copay for fulvestrant injection, our financial advocate will look for new financial assistance - injection in 2 and 4 weeks if we can resolve the financial issue -PET scan in 3 weeks  -lab and F/u in 4 weeks  -I spoke with her daughter during her visit today   No problem-specific Assessment & Plan notes found for this encounter.   Orders Placed This Encounter  Procedures  . NM PET Image Restag (PS) Skull Base To Thigh    Standing Status:   Future    Standing Expiration Date:   04/29/2021    Order Specific Question:   If indicated for the ordered procedure, I authorize the administration of a radiopharmaceutical per Radiology protocol    Answer:   Yes    Order Specific Question:   Preferred imaging location?  Answer:   [facility]    Order Specific Question:   Radiology Contrast Protocol - do NOT remove file path    Answer:   [email or web address]   All questions were answered. The patient knows to call the clinic with any problems, questions or concerns. No barriers to learning was detected. The total time spent in the appointment was 30  minutes.     Truitt Merle, MD 04/29/2020   I, Joslyn Devon, am acting as scribe for Truitt Merle, MD.   I have reviewed the above documentation for accuracy and completeness, and I agree with the above.

## 2020-04-29 ENCOUNTER — Encounter: Payer: Self-pay | Admitting: Hematology

## 2020-04-29 ENCOUNTER — Other Ambulatory Visit: Payer: Self-pay

## 2020-04-29 ENCOUNTER — Inpatient Hospital Stay: Payer: PPO

## 2020-04-29 ENCOUNTER — Inpatient Hospital Stay: Payer: PPO | Attending: Hematology

## 2020-04-29 ENCOUNTER — Telehealth: Payer: Self-pay | Admitting: Hematology

## 2020-04-29 ENCOUNTER — Inpatient Hospital Stay: Payer: PPO | Admitting: Hematology

## 2020-04-29 VITALS — BP 120/78 | HR 73 | Temp 98.1°F | Resp 18 | Ht 60.0 in | Wt 131.4 lb

## 2020-04-29 DIAGNOSIS — K5730 Diverticulosis of large intestine without perforation or abscess without bleeding: Secondary | ICD-10-CM | POA: Diagnosis not present

## 2020-04-29 DIAGNOSIS — I2510 Atherosclerotic heart disease of native coronary artery without angina pectoris: Secondary | ICD-10-CM | POA: Insufficient documentation

## 2020-04-29 DIAGNOSIS — K760 Fatty (change of) liver, not elsewhere classified: Secondary | ICD-10-CM | POA: Insufficient documentation

## 2020-04-29 DIAGNOSIS — C50112 Malignant neoplasm of central portion of left female breast: Secondary | ICD-10-CM | POA: Diagnosis not present

## 2020-04-29 DIAGNOSIS — K219 Gastro-esophageal reflux disease without esophagitis: Secondary | ICD-10-CM | POA: Insufficient documentation

## 2020-04-29 DIAGNOSIS — Z923 Personal history of irradiation: Secondary | ICD-10-CM | POA: Diagnosis not present

## 2020-04-29 DIAGNOSIS — I700 Atherosclerosis of aorta: Secondary | ICD-10-CM | POA: Diagnosis not present

## 2020-04-29 DIAGNOSIS — Z79818 Long term (current) use of other agents affecting estrogen receptors and estrogen levels: Secondary | ICD-10-CM | POA: Diagnosis not present

## 2020-04-29 DIAGNOSIS — I071 Rheumatic tricuspid insufficiency: Secondary | ICD-10-CM | POA: Diagnosis not present

## 2020-04-29 DIAGNOSIS — F418 Other specified anxiety disorders: Secondary | ICD-10-CM | POA: Diagnosis not present

## 2020-04-29 DIAGNOSIS — Z79899 Other long term (current) drug therapy: Secondary | ICD-10-CM | POA: Diagnosis not present

## 2020-04-29 DIAGNOSIS — Z7982 Long term (current) use of aspirin: Secondary | ICD-10-CM | POA: Diagnosis not present

## 2020-04-29 DIAGNOSIS — Z170 Estrogen receptor positive status [ER+]: Secondary | ICD-10-CM | POA: Diagnosis not present

## 2020-04-29 DIAGNOSIS — I10 Essential (primary) hypertension: Secondary | ICD-10-CM | POA: Diagnosis not present

## 2020-04-29 DIAGNOSIS — Z9012 Acquired absence of left breast and nipple: Secondary | ICD-10-CM | POA: Diagnosis not present

## 2020-04-29 DIAGNOSIS — M810 Age-related osteoporosis without current pathological fracture: Secondary | ICD-10-CM | POA: Diagnosis not present

## 2020-04-29 DIAGNOSIS — R05 Cough: Secondary | ICD-10-CM | POA: Diagnosis not present

## 2020-04-29 LAB — CBC WITH DIFFERENTIAL/PLATELET
Abs Immature Granulocytes: 0.01 10*3/uL (ref 0.00–0.07)
Basophils Absolute: 0 10*3/uL (ref 0.0–0.1)
Basophils Relative: 1 %
Eosinophils Absolute: 0.6 10*3/uL — ABNORMAL HIGH (ref 0.0–0.5)
Eosinophils Relative: 16 %
HCT: 36.9 % (ref 36.0–46.0)
Hemoglobin: 11.9 g/dL — ABNORMAL LOW (ref 12.0–15.0)
Immature Granulocytes: 0 %
Lymphocytes Relative: 21 %
Lymphs Abs: 0.8 10*3/uL (ref 0.7–4.0)
MCH: 31.4 pg (ref 26.0–34.0)
MCHC: 32.2 g/dL (ref 30.0–36.0)
MCV: 97.4 fL (ref 80.0–100.0)
Monocytes Absolute: 0.4 10*3/uL (ref 0.1–1.0)
Monocytes Relative: 9 %
Neutro Abs: 2 10*3/uL (ref 1.7–7.7)
Neutrophils Relative %: 53 %
Platelets: 159 10*3/uL (ref 150–400)
RBC: 3.79 MIL/uL — ABNORMAL LOW (ref 3.87–5.11)
RDW: 13.1 % (ref 11.5–15.5)
WBC: 3.8 10*3/uL — ABNORMAL LOW (ref 4.0–10.5)
nRBC: 0 % (ref 0.0–0.2)

## 2020-04-29 LAB — COMPREHENSIVE METABOLIC PANEL
ALT: 17 U/L (ref 0–44)
AST: 23 U/L (ref 15–41)
Albumin: 3.6 g/dL (ref 3.5–5.0)
Alkaline Phosphatase: 88 U/L (ref 38–126)
Anion gap: 12 (ref 5–15)
BUN: 23 mg/dL (ref 8–23)
CO2: 25 mmol/L (ref 22–32)
Calcium: 9.3 mg/dL (ref 8.9–10.3)
Chloride: 105 mmol/L (ref 98–111)
Creatinine, Ser: 0.99 mg/dL (ref 0.44–1.00)
GFR calc Af Amer: 58 mL/min — ABNORMAL LOW (ref >60)
GFR calc non Af Amer: 50 mL/min — ABNORMAL LOW (ref >60)
Glucose, Bld: 80 mg/dL (ref 70–99)
Potassium: 3.7 mmol/L (ref 3.5–5.1)
Sodium: 142 mmol/L (ref 135–145)
Total Bilirubin: 0.5 mg/dL (ref 0.3–1.2)
Total Protein: 6.4 g/dL — ABNORMAL LOW (ref 6.5–8.1)

## 2020-04-29 MED ORDER — FULVESTRANT 250 MG/5ML IM SOLN
INTRAMUSCULAR | Status: AC
  Filled 2020-04-29: qty 10

## 2020-04-29 MED ORDER — DULOXETINE HCL 20 MG PO CPEP: 20.0000 mg | ORAL_CAPSULE | Freq: Every day | ORAL | 1 refills | Status: DC

## 2020-04-29 MED ORDER — FULVESTRANT 250 MG/5ML IM SOLN
500.0000 mg | INTRAMUSCULAR | Status: DC
  Administered 2020-04-29: 500 mg via INTRAMUSCULAR

## 2020-04-29 NOTE — Progress Notes (Signed)
I spoke with Stefanie Libel regarding [employer].  She states that grants are only good for 1 year.  Marguarite Arbour states she will review her chart and reach out to patient.

## 2020-04-29 NOTE — Patient Instructions (Signed)
Fulvestrant injection What is this medicine? FULVESTRANT (ful VES trant) blocks the effects of estrogen. It is used to treat breast cancer. This medicine may be used for other purposes; ask your health care provider or pharmacist if you have questions. COMMON BRAND NAME(S): FASLODEX What should I tell my health care provider before I take this medicine? They need to know if you have any of these conditions:  bleeding disorders  liver disease  low blood counts, like low white cell, platelet, or red cell counts  an unusual or allergic reaction to fulvestrant, other medicines, foods, dyes, or preservatives  pregnant or trying to get pregnant  breast-feeding How should I use this medicine? This medicine is for injection into a muscle. It is usually given by a health care professional in a hospital or clinic setting. Talk to your pediatrician regarding the use of this medicine in children. Special care may be needed. Overdosage: If you think you have taken too much of this medicine contact a poison control center or emergency room at once. NOTE: This medicine is only for you. Do not share this medicine with others. What if I miss a dose? It is important not to miss your dose. Call your doctor or health care professional if you are unable to keep an appointment. What may interact with this medicine?  medicines that treat or prevent blood clots like warfarin, enoxaparin, dalteparin, apixaban, dabigatran, and rivaroxaban This list may not describe all possible interactions. Give your health care provider a list of all the medicines, herbs, non-prescription drugs, or dietary supplements you use. Also tell them if you smoke, drink alcohol, or use illegal drugs. Some items may interact with your medicine. What should I watch for while using this medicine? Your condition will be monitored carefully while you are receiving this medicine. You will need important blood work done while you are taking  this medicine. Do not become pregnant while taking this medicine or for at least 1 year after stopping it. Women of child-bearing potential will need to have a negative pregnancy test before starting this medicine. Women should inform their doctor if they wish to become pregnant or think they might be pregnant. There is a potential for serious side effects to an unborn child. Men should inform their doctors if they wish to father a child. This medicine may lower sperm counts. Talk to your health care professional or pharmacist for more information. Do not breast-feed an infant while taking this medicine or for 1 year after the last dose. What side effects may I notice from receiving this medicine? Side effects that you should report to your doctor or health care professional as soon as possible:  allergic reactions like skin rash, itching or hives, swelling of the face, lips, or tongue  feeling faint or lightheaded, falls  pain, tingling, numbness, or weakness in the legs  signs and symptoms of infection like fever or chills; cough; flu-like symptoms; sore throat  vaginal bleeding Side effects that usually do not require medical attention (report to your doctor or health care professional if they continue or are bothersome):  aches, pains  constipation  diarrhea  headache  hot flashes  nausea, vomiting  pain at site where injected  stomach pain This list may not describe all possible side effects. Call your doctor for medical advice about side effects. You may report side effects to FDA at 1-800-FDA-1088. Where should I keep my medicine? This drug is given in a hospital or clinic and will   not be stored at home. NOTE: This sheet is a summary. It may not cover all possible information. If you have questions about this medicine, talk to your doctor, pharmacist, or health care provider.  2020 Elsevier/Gold Standard (2018-01-24 11:34:41)  

## 2020-04-29 NOTE — Progress Notes (Signed)
RN called to inquire about assistance for patient for Faslodex.  Patient only has about $3 remaining from her in-house [place].  Reviewed previous copay assistance and followed up with PAF to confirm grant has expired. Per [employer] has expired and there are currently no funds available for patient diagnosis to assist with her portion of Faslodex.  Advised doctor of updates. She asked if I would try to find something else for patient. I advised I would see what I can do. Will review with Lenise when she returns for follow up.

## 2020-04-29 NOTE — Telephone Encounter (Signed)
Scheduled per 07/01 los, patient received updated calender.

## 2020-04-30 LAB — CANCER ANTIGEN 27.29: CA 27.29: 54.8 U/mL — ABNORMAL HIGH (ref 0.0–38.6)

## 2020-05-11 ENCOUNTER — Telehealth: Payer: Self-pay | Admitting: *Deleted

## 2020-05-11 NOTE — Telephone Encounter (Signed)
Received several vm from pt stating that she has a sore throat & is on ATB for 5 days & might need to cancel inj for Thursday & r/s day of PET.  Returned call to pt & she reports no other symptoms.  She reports falling in her driveway & messing up her face a little.  She was seen couple of weeks ago by PCP & he called in Delsym cough med for her later after this visit & she called back & got ATB-Doxycycline for 5 days.  Informed to call us tomorrow pm or early am on Thursday to see if she needs to cancel & r/s.  Message to Dr Feng/Pod RN.

## 2020-05-13 ENCOUNTER — Inpatient Hospital Stay: Payer: PPO

## 2020-05-13 ENCOUNTER — Other Ambulatory Visit: Payer: Self-pay

## 2020-05-13 VITALS — BP 124/75 | HR 68 | Temp 98.1°F | Resp 18

## 2020-05-13 DIAGNOSIS — C50112 Malignant neoplasm of central portion of left female breast: Secondary | ICD-10-CM | POA: Diagnosis not present

## 2020-05-13 DIAGNOSIS — Z170 Estrogen receptor positive status [ER+]: Secondary | ICD-10-CM

## 2020-05-13 LAB — CBC WITH DIFFERENTIAL/PLATELET
Abs Immature Granulocytes: 0.02 10*3/uL (ref 0.00–0.07)
Basophils Absolute: 0.1 10*3/uL (ref 0.0–0.1)
Basophils Relative: 1 %
Eosinophils Absolute: 0.8 10*3/uL — ABNORMAL HIGH (ref 0.0–0.5)
Eosinophils Relative: 18 %
HCT: 38.2 % (ref 36.0–46.0)
Hemoglobin: 12.6 g/dL (ref 12.0–15.0)
Immature Granulocytes: 0 %
Lymphocytes Relative: 18 %
Lymphs Abs: 0.9 10*3/uL (ref 0.7–4.0)
MCH: 32.7 pg (ref 26.0–34.0)
MCHC: 33 g/dL (ref 30.0–36.0)
MCV: 99.2 fL (ref 80.0–100.0)
Monocytes Absolute: 0.5 10*3/uL (ref 0.1–1.0)
Monocytes Relative: 11 %
Neutro Abs: 2.4 10*3/uL (ref 1.7–7.7)
Neutrophils Relative %: 52 %
Platelets: 180 10*3/uL (ref 150–400)
RBC: 3.85 MIL/uL — ABNORMAL LOW (ref 3.87–5.11)
RDW: 13.1 % (ref 11.5–15.5)
WBC: 4.7 10*3/uL (ref 4.0–10.5)
nRBC: 0 % (ref 0.0–0.2)

## 2020-05-13 LAB — COMPREHENSIVE METABOLIC PANEL
ALT: 15 U/L (ref 0–44)
AST: 25 U/L (ref 15–41)
Albumin: 3.7 g/dL (ref 3.5–5.0)
Alkaline Phosphatase: 75 U/L (ref 38–126)
Anion gap: 10 (ref 5–15)
BUN: 23 mg/dL (ref 8–23)
CO2: 26 mmol/L (ref 22–32)
Calcium: 9.7 mg/dL (ref 8.9–10.3)
Chloride: 105 mmol/L (ref 98–111)
Creatinine, Ser: 0.97 mg/dL (ref 0.44–1.00)
GFR calc Af Amer: 60 mL/min — ABNORMAL LOW (ref >60)
GFR calc non Af Amer: 51 mL/min — ABNORMAL LOW (ref >60)
Glucose, Bld: 81 mg/dL (ref 70–99)
Potassium: 3.8 mmol/L (ref 3.5–5.1)
Sodium: 141 mmol/L (ref 135–145)
Total Bilirubin: 0.5 mg/dL (ref 0.3–1.2)
Total Protein: 6.7 g/dL (ref 6.5–8.1)

## 2020-05-13 MED ORDER — FULVESTRANT 250 MG/5ML IM SOLN
500.0000 mg | INTRAMUSCULAR | Status: DC
  Administered 2020-05-13: 500 mg via INTRAMUSCULAR

## 2020-05-13 NOTE — Patient Instructions (Signed)
Fulvestrant injection What is this medicine? FULVESTRANT (ful VES trant) blocks the effects of estrogen. It is used to treat breast cancer. This medicine may be used for other purposes; ask your health care provider or pharmacist if you have questions. COMMON BRAND NAME(S): FASLODEX What should I tell my health care provider before I take this medicine? They need to know if you have any of these conditions:  bleeding disorders  liver disease  low blood counts, like low white cell, platelet, or red cell counts  an unusual or allergic reaction to fulvestrant, other medicines, foods, dyes, or preservatives  pregnant or trying to get pregnant  breast-feeding How should I use this medicine? This medicine is for injection into a muscle. It is usually given by a health care professional in a hospital or clinic setting. Talk to your pediatrician regarding the use of this medicine in children. Special care may be needed. Overdosage: If you think you have taken too much of this medicine contact a poison control center or emergency room at once. NOTE: This medicine is only for you. Do not share this medicine with others. What if I miss a dose? It is important not to miss your dose. Call your doctor or health care professional if you are unable to keep an appointment. What may interact with this medicine?  medicines that treat or prevent blood clots like warfarin, enoxaparin, dalteparin, apixaban, dabigatran, and rivaroxaban This list may not describe all possible interactions. Give your health care provider a list of all the medicines, herbs, non-prescription drugs, or dietary supplements you use. Also tell them if you smoke, drink alcohol, or use illegal drugs. Some items may interact with your medicine. What should I watch for while using this medicine? Your condition will be monitored carefully while you are receiving this medicine. You will need important blood work done while you are taking  this medicine. Do not become pregnant while taking this medicine or for at least 1 year after stopping it. Women of child-bearing potential will need to have a negative pregnancy test before starting this medicine. Women should inform their doctor if they wish to become pregnant or think they might be pregnant. There is a potential for serious side effects to an unborn child. Men should inform their doctors if they wish to father a child. This medicine may lower sperm counts. Talk to your health care professional or pharmacist for more information. Do not breast-feed an infant while taking this medicine or for 1 year after the last dose. What side effects may I notice from receiving this medicine? Side effects that you should report to your doctor or health care professional as soon as possible:  allergic reactions like skin rash, itching or hives, swelling of the face, lips, or tongue  feeling faint or lightheaded, falls  pain, tingling, numbness, or weakness in the legs  signs and symptoms of infection like fever or chills; cough; flu-like symptoms; sore throat  vaginal bleeding Side effects that usually do not require medical attention (report to your doctor or health care professional if they continue or are bothersome):  aches, pains  constipation  diarrhea  headache  hot flashes  nausea, vomiting  pain at site where injected  stomach pain This list may not describe all possible side effects. Call your doctor for medical advice about side effects. You may report side effects to FDA at 1-800-FDA-1088. Where should I keep my medicine? This drug is given in a hospital or clinic and will   not be stored at home. NOTE: This sheet is a summary. It may not cover all possible information. If you have questions about this medicine, talk to your doctor, pharmacist, or health care provider.  2020 Elsevier/Gold Standard (2018-01-24 11:34:41)  

## 2020-05-18 ENCOUNTER — Ambulatory Visit (HOSPITAL_COMMUNITY)
Admission: RE | Admit: 2020-05-18 | Discharge: 2020-05-18 | Disposition: A | Discharge status: Home / Self Care | Payer: PPO | Source: Ambulatory Visit | Attending: Hematology | Admitting: Hematology

## 2020-05-18 ENCOUNTER — Other Ambulatory Visit: Payer: Self-pay

## 2020-05-18 DIAGNOSIS — I700 Atherosclerosis of aorta: Secondary | ICD-10-CM | POA: Diagnosis not present

## 2020-05-18 DIAGNOSIS — Z170 Estrogen receptor positive status [ER+]: Secondary | ICD-10-CM | POA: Diagnosis not present

## 2020-05-18 DIAGNOSIS — C50112 Malignant neoplasm of central portion of left female breast: Secondary | ICD-10-CM | POA: Insufficient documentation

## 2020-05-18 DIAGNOSIS — K760 Fatty (change of) liver, not elsewhere classified: Secondary | ICD-10-CM | POA: Diagnosis not present

## 2020-05-18 DIAGNOSIS — J9811 Atelectasis: Secondary | ICD-10-CM | POA: Diagnosis not present

## 2020-05-18 DIAGNOSIS — M47814 Spondylosis without myelopathy or radiculopathy, thoracic region: Secondary | ICD-10-CM | POA: Diagnosis not present

## 2020-05-18 DIAGNOSIS — C50919 Malignant neoplasm of unspecified site of unspecified female breast: Secondary | ICD-10-CM | POA: Diagnosis not present

## 2020-05-18 DIAGNOSIS — Z9012 Acquired absence of left breast and nipple: Secondary | ICD-10-CM | POA: Insufficient documentation

## 2020-05-18 LAB — GLUCOSE, CAPILLARY: Glucose-Capillary: 86 mg/dL (ref 70–99)

## 2020-05-18 MED ORDER — FLUDEOXYGLUCOSE F - 18 (FDG) INJECTION
6.5000 | Freq: Once | INTRAVENOUS | Status: AC
  Administered 2020-05-18: 6.5 via INTRAVENOUS

## 2020-05-21 NOTE — Progress Notes (Signed)
[place]   [phone]) [phone] Fax:(336) [phone]   Clinic Follow up Note   Patient Care Team: Lajean Manes, MD as PCP - Pocahontas, Sentinel, MD as Consulting Physician (General Surgery) Truitt Merle, MD as Consulting Physician (Hematology) [name], MD as Consulting Physician (Radiation Oncology) Sylvan Cheese, NP as Nurse Practitioner (Hematology and Oncology) Karen Kays, NP as Nurse Practitioner (Nurse Practitioner)  Date of Service:  05/27/2020  CHIEF COMPLAINT: F/uinvasive lobular carcinoma of the left breast  SUMMARY OF ONCOLOGIC HISTORY: Oncology History Overview Note  Cancer of central portion of left female breast [facility])   Staging form: Breast, AJCC 7th Edition     Pathologic stage from 08/04/2010: Stage IIB (T2, N1a, cM0) - Signed by Truitt Merle, MD on 12/09/2015     Cancer of central portion of left female breast ([place])  08/03/2010 Mammogram    left nipple retraction, and the palpable mass subareolar left breast at the 6:00 position. Ultrasound confirmed the presence of a mass measuring 2.6 x 1.7 x 2.4 cm   08/04/2010 Initial Biopsy   left breast mass biopsy showed an invasive mammary carcinoma with lobular features. ER 95%, PR 97%, Ki-67 17%, HER-2/neu (-)   08/04/2010 Pathologic Stage   Stage IIB: T2 N1a   08/11/2010 Imaging   left breast mass 4.6 x 4.2 x 2.3 cm with enhancement distortion extending to the nipple retraction. There is questionable anterior mediastinal lymph node seen   07/2010 - 09/03/2010 Anti-estrogen oral therapy   neoadjuvant letrozole    09/03/2010 Surgery    left breast mastectomy with sentinel node biopsy on 09/03/2010.   08/2010 - 01/2015 Anti-estrogen oral therapy   Tamoxifen, pt stopped on her own    12/03/2010 - 01/23/2011 Radiation Therapy   left chest wall and axilla radiaiton    10/19/2015 Progression   Left chest wall recurrence, s/p resection on 11/25/2015 with positive margins (IL[facility])      12/10/2015 - 05/31/2017 Anti-estrogen oral therapy   anastrozole 27m daily, which was switched to Tamoxifen on 06/30/2016 due to persistent disease.   01/03/2016 - 03/03/2016 Radiation Therapy   Radiation to left chest wall recurrence (12/06/2015-02/04/2016 - 44 Gy), pt progressed through the radiation, and had additional 10 fractions of radiation (02/21/16-03/03/2016 - total 70 Gy to the left chest wall)   07/14/2016 PET scan   PET 07/14/2016 IMPRESSION: 1. No findings of recurrent malignancy. 2. Radiation fibrosis in the lingula. A small left axillary lymph node is not hypermetabolic and only 6 mm in diameter. This is in the vicinity of the prior axillary cyst. 3. Pelvic floor laxity.   07/17/2016 Surgery   Chest wall soft tissue resection for local recurrence   07/17/2016 Pathology Results   Left chest wall two soft tissue resection both showed invasive lobular carcinoma, margins were positive. ER 95% positive, PR 60% positive, HER-2 negative.   07/27/2016 Imaging   CT chest, abdomen and pelvis wo contrast  07/27/2016 IMPRESSION: Sigmoid diverticulosis without acute diverticulitis. No bowel obstruction or acute inflammation. Degenerative disc disease and facet arthropathy of the lower lumbar spine with slight grade 1 anterolisthesis of L4 on L5. Stable appearing simple left-sided renal cysts.   03/01/2017 Mammogram   Mammogram 03/01/2017 IMPRESSION: No mammographic evidence of malignancy. A result letter of this screening mammogram will be mailed directly to the patient. RECOMMENDATION: Screening mammogram in one year   04/10/2017 Imaging   PET IMPRESSION: 1. Stable exam.  No findings of recurrent malignancy. 2. Changes  of external beam radiation again noted within the lingula. 3. Small left axillary lymph node exhibits mild increased uptake. Unchanged from previous exam.   06/07/2017 -  Anti-estrogen oral therapy   Stopped Tamoxifen and Began monthly Faslodex IM injection on 06/07/17  -Held  after 09/26/19 due to lost f/u. Restart with loading dose on 04/29/20.    09/19/2017 Imaging   CT CAP WO Contrast 09/19/17 IMPRESSION: 1. Surgical changes from a left mastectomy without CT findings to suggest recurrent chest wall tumor or axillary lymphadenopathy. 2. No findings to suggest metastatic disease involving the neck, chest, abdomen or pelvis or osseous structures. 3. Stable radiation changes involving the lingula.    09/19/2017 Imaging   Bone Scan Whole Body  IMPRESSION: No definite scintigraphic evidence of osseous metastatic disease. Scattered degenerative type uptake as above.   03/11/2018 PET scan   IMPRESSION:  1. No evidence of recurrent disease. 2.  Aortic atherosclerosis    09/03/2018 PET scan   09/03/2018 PET Scan IMPRESSION: 1. No findings to suggest locally recurrent disease or metastatic disease in the neck, chest, abdomen or pelvis. 2. Hepatic steatosis. 3. Colonic diverticulosis without evidence of acute diverticulitis at this time. 4. Aortic atherosclerosis. 5. Additional incidental findings, as above.   11/25/2018 Imaging   DG Ribs Unilateral W/Chest Left  IMPRESSION: Stable left lingular scarring. Normal left ribs. No acute cardiopulmonary abnormality seen.    04/04/2019 PET scan   PET  IMPRESSION: 1. No evidence of hypermetabolic recurrent or metastatic disease. 2. Hepatic steatosis. 3.  Aortic Atherosclerosis (ICD10-I70.0).   05/18/2020 PET scan   IMPRESSION: 1. Findings of LEFT mastectomy and axillary dissection with radiation changes, no signs of metastatic disease or disease recurrence at this time. 2. Hepatic steatosis. 3. Aortic atherosclerosis.   Aortic Atherosclerosis (ICD10-I70.0).        CURRENT THERAPY:  Monthly Faslodex injection started on 06/07/17. Held after 09/26/19 due to lost f/u. Restart with loading dose (every 2 weeks for 3 injections) on 04/29/20.   INTERVAL HISTORY:  Gloria [name] is here for a follow up. She  presents to the clinic alone. She notes she had fall 3-4 weeks  ago after felling dizzy when taking the trash out. She fell on her face and has small bruise of her nose. She notes ambulates with walker. She feels Cymbalta was related to her dizziness so she stopped it.  She notes mild left chest wall pain. She feels this has improved since starting Faslodex injections. She also notes her intermittent headaches have improved. She takes Tylenol or this which resolves it. She denies change in vision.    REVIEW OF SYSTEMS:   Constitutional: Denies fevers, chills or abnormal weight loss (+) Improved intermittent headache Eyes: Denies blurriness of vision Ears, nose, mouth, throat, and face: Denies mucositis or sore throat Respiratory: Denies cough, dyspnea or wheezes Cardiovascular: Denies palpitation, chest discomfort or lower extremity swelling Gastrointestinal:  Denies nausea, heartburn or change in bowel habits Skin: Denies abnormal skin rashes Lymphatics: Denies new lymphadenopathy or easy bruising Neurological:Denies numbness, tingling or new weaknesses Behavioral/Psych: Mood is stable, no new changes  BREAST: (+) Left chest wall pain, improved.  All other systems were reviewed with the patient and are negative.  MEDICAL HISTORY:  Past Medical History:  Diagnosis Date  . Anxiety   . Blood in urine   . CAD (coronary artery disease)    non obstructive by cath  . Cancer [facility])    breast- left  . Depression   .  GERD (gastroesophageal reflux disease)   . Hypertension   . Knee fracture   . Personal history of radiation therapy 2017  . TR (tricuspid regurgitation)    mild by Echo 12/2008 EF >55%    SURGICAL HISTORY: Past Surgical History:  Procedure Laterality Date  . ABDOMINAL AORTAGRAM N/A 09/08/2011   Procedure: ABDOMINAL Maxcine Ham;  Surgeon: Troy Sine, MD;  Location: [facility] CATH LAB;  Service: Cardiovascular;  Laterality: N/A;  . ABDOMINAL HYSTERECTOMY    . BREAST EXCISIONAL  BIOPSY Left 2017   X3  . BREAST SURGERY  08-30-10   mastectomy  . CARDIAC CATHETERIZATION  08/2011   20% LAD stenosis, 20% diagonal stenosis, 10-20% proximal dominant RCA stenosis  . COLON SURGERY    . LEFT HEART CATHETERIZATION WITH CORONARY ANGIOGRAM N/A 09/08/2011   Procedure: LEFT HEART CATHETERIZATION WITH CORONARY ANGIOGRAM;  Surgeon: Troy Sine, MD;  Location: [facility] CATH LAB;  Service: Cardiovascular;  Laterality: N/A;  . MASTECTOMY    . MINOR BREAST BIOPSY Left 11/25/2015   Procedure: MINOR EXCISION MASS LEFT CHEST WALL;  Surgeon: Autumn Messing III, MD;  Location: [place];  Service: General;  Laterality: Left;  . MINOR BREAST BIOPSY Left 07/17/2016   Procedure: EXCISION OF 2 LEFT CHEST WALL MASSES;  Surgeon: Autumn Messing III, MD;  Location: [place];  Service: General;  Laterality: Left;  . TONSILLECTOMY      I have reviewed the social history and family history with the patient and they are unchanged from previous note.  ALLERGIES:  is allergic to contrast media [iodinated diagnostic agents], penicillins, adhesive [tape], cephalexin, ciprofloxacin, demerol, meperidine hcl, sulfa antibiotics, sulfamethoxazole-trimethoprim, and quinolones.  MEDICATIONS:  Current Outpatient Medications  Medication Sig Dispense Refill  . acetaminophen (TYLENOL) 500 MG tablet Take 1,000 mg by mouth daily as needed for moderate pain. Reported on 03/10/2016    . ALPRAZolam (XANAX) 0.25 MG tablet TAKE 1/2 (ONE-HALF) TABLET BY MOUTH IN THE MORNING AND 1 AT BEDTIME AS NEEDED 30 tablet 0  . aspirin 81 MG chewable tablet Chew 81 mg by mouth once a week. Reported on 03/10/2016    . CALCIUM-MAGNESIUM-VITAMIN D PO Take 1 tablet by mouth 2 (two) times daily.    Marland Kitchen doxepin (SINEQUAN) 10 MG capsule Take 10 mg by mouth at bedtime.     Marland Kitchen doxycycline (VIBRA-TABS) 100 MG tablet Take 100 mg by mouth 2 (two) times daily.    . DULoxetine (CYMBALTA) 20 MG capsule Take 1 capsule (20 mg total) by  mouth daily. 30 capsule 1  . fluticasone (FLONASE) 50 MCG/ACT nasal spray Place 1 spray into both nostrils daily as needed for allergies.     Marland Kitchen guaiFENesin (MUCINEX) 600 MG 12 hr tablet Take 600 mg by mouth daily as needed for cough or to loosen phlegm. Reported on 03/28/2016    . losartan (COZAAR) 25 MG tablet 25 mg at bedtime.     Marland Kitchen losartan (COZAAR) 50 MG tablet Take 1 tablet by mouth in the morning.     . nitroGLYCERIN (NITROSTAT) 0.4 MG SL tablet Place 1 tablet (0.4 mg total) under the tongue every 5 (five) minutes as needed for chest pain. <PLEASE MAKE APPOINTMENT> 25 tablet 3  . omeprazole (PRILOSEC) 20 MG capsule omeprazole 20 mg capsule,delayed release    . pseudoephedrine-acetaminophen (TYLENOL SINUS) 30-500 MG TABS tablet Take 1 tablet by mouth every 4 (four) hours as needed.    . senna (SENOKOT) 8.6 MG tablet Take 1 tablet by mouth  every other day.    . triamcinolone (KENALOG) 0.025 % cream Apply 1 application topically 2 (two) times daily. 30 g 0  . amLODipine (NORVASC) 2.5 MG tablet amlodipine 2.5 mg tablet  TK 1 T PO QD    . hydrochlorothiazide (MICROZIDE) 12.5 MG capsule Take 1 capsule (12.5 mg total) by mouth daily. 90 capsule 3   No current facility-administered medications for this visit.   Facility-Administered Medications Ordered in Other Visits  Medication Dose Route Frequency Provider Last Rate Last Admin  . fulvestrant (FASLODEX) injection 500 mg  500 mg Intramuscular Q30 days Truitt Merle, MD   500 mg at 05/27/20 1109    PHYSICAL EXAMINATION: ECOG PERFORMANCE STATUS: 1 - Symptomatic but completely ambulatory  Vitals:   05/27/20 1023  BP: 126/74  Pulse: 80  Resp: 18  Temp: (!) 97.5 F (36.4 C)  SpO2: 97%   Filed Weights   05/27/20 1023  Weight: 131 lb 6.4 oz (59.6 kg)    GENERAL:alert, no distress and comfortable SKIN: skin color, texture, turgor are normal, no rashes or significant lesions EYES: normal, Conjunctiva are pink and non-injected, sclera clear    NECK: supple, thyroid normal size, non-tender, without nodularity LYMPH:  no palpable lymphadenopathy in the cervical, axillary  LUNGS: clear to auscultation and percussion with normal breathing effort HEART: regular rate & rhythm and no murmurs and no lower extremity edema ABDOMEN:abdomen soft, non-tender and normal bowel sounds Musculoskeletal:no cyanosis of digits and no clubbing  NEURO: alert & oriented x 3 with fluent speech, no focal motor/sensory deficits BREAST: (+) S/p left mastectomy: surgical incision healed well (+) Lump along mastectomy incision, mostly stable.   LABORATORY DATA:  I have reviewed the data as listed CBC Latest Ref Rng & Units 05/27/2020 05/13/2020 04/29/2020  WBC 4.0 - 10.5 K/uL 3.9(L) 4.7 3.8(L)  Hemoglobin 12.0 - 15.0 g/dL 12.2 12.6 11.9(L)  Hematocrit 36 - 46 % 37.5 38.2 36.9  Platelets 150 - 400 K/uL 144(L) 180 159     CMP Latest Ref Rng & Units 05/27/2020 05/13/2020 04/29/2020  Glucose 70 - 99 mg/dL 87 81 80  BUN 8 - 23 mg/dL _0 Creatinine 0.44 - 1.00 mg/dL 0.96 0.97 0.99  Sodium 135 - 145 mmol/L 144 141 142  Potassium 3.5 - 5.1 mmol/L 3.7 3.8 3.7  Chloride 98 - 111 mmol/L 108 105 105  CO2 22 - 32 mmol/L _1 Calcium 8.9 - 10.3 mg/dL 9.9 9.7 9.3  Total Protein 6.5 - 8.1 g/dL 6.3(L) 6.7 6.4(L)  Total Bilirubin 0.3 - 1.2 mg/dL 0.4 0.5 0.5  Alkaline Phos 38 - 126 U/L 76 75 88  AST 15 - 41 U/L _2 ALT 0 - 44 U/L _3 RADIOGRAPHIC STUDIES: I have personally reviewed the radiological images as listed and agreed with the findings in the report. No results found.   ASSESSMENT & PLAN:  KARLA PAVONE is a 86 y.o. female with    1. Cancer of the central portion of left female breast, Stage IIB, T2 N1 invasive lobular carcinoma, grade 2, ER 95%, PR 97%, Ki-67 17%, HER-2/neu no amplification, diagnosed in 07/2010, local chest wall recurrence in 09/2015  -She wasdiagnosed in 10/2011but hadchest wall local recurrence in  09/2015.She was on adjuvant tamoxifen for 4.5 years, stopped on her own anddeveloped local recurrence 8 months after she stopped tamoxifen. -She has completed chest wall radiation, however developedlocal recurrence again andunderwent a second  chest wall surgery for two soft tissue resection in 06/2016. Unfortunately the surgicalmargins were positive. Dr. Marlou Starks did not offer more surgery because complete surgical resection was unlikely. Patient was also not in favor of more extensive surgery. -She has been onmonthly fulvestrant injectionstarting8/9/18due to clinical suspicion for disease progress.Due to lost follow up and miscommunications she was off treatment 09/26/19-04/29/20.  -I personally reviewed and discussed her PET from 05/18/20 which showed no signs of metastatic disease or disease recurrence at this time. Scan also showed known fatty liver. We discussed that scan has limitation to detect small size especially very slow growing tumor, which she likely has. Her tumor marker CA27.29 was recently elevated after her treatment stopped last year, will monitor it since we restarted her treatment   -She is tolerating Faslodex injections well. Her left chest wall pain has improved. Labs reviewed, CBC and CMP WNL except WBC 3.9, plt 144K, protein 6.3. Physical exam stable.  -Will proceed with Faslodex injection today and continue every 4 weeks now.  -F/u in 3 months   2.Left chest wall pain  -Secondary to breast surgery -For pain I called in Cymbalta (04/29/20). She is already on antidepressant Doxepin so I recommend she f/u with Dr Felipa Eth about weaning off Doxepin. -Pain has improved on Faslodex injections. Her intermittent headaches also improved on injections  -She notes dizziness from Cymbalta which results in a fall 3-4 weeks ago (04/2020). No fracture occurred, but does have resolving bruise on nose. She has stopped Cymbalta.   3. HTN, CAD  -She'll continue follow-up with her primary  care physician and cardiologist -I previously encouraged her to monitor her BP at home and if remains elevated she should see her PCP. She is on Losartan.  4. Osteoporosis  -Her last DEXA scan in 06/2012 showed osteoporosis  -I previously encouraged her to continue calcium and Vit D -I previously encouraged her to repeat DEXA at her PCP office   5. Goal of care discussion  -The patient understands the goal of care is palliative. -she is full code for now   Plan -Faslodex injections every 4 weeks X3  -Lab and F/u in 12 weeks  -she is approved for financial assistance for treatment.  -I called her daughter [name] to update her   No problem-specific Assessment & Plan notes found for this encounter.   No orders of the defined types were placed in this encounter.  All questions were answered. The patient knows to call the clinic with any problems, questions or concerns. No barriers to learning was detected. The total time spent in the appointment was 30 minutes.     Truitt Merle, MD 05/27/2020   I, Joslyn Devon, am acting as scribe for Truitt Merle, MD.   I have reviewed the above documentation for accuracy and completeness, and I agree with the above.

## 2020-05-27 ENCOUNTER — Other Ambulatory Visit: Payer: Self-pay

## 2020-05-27 ENCOUNTER — Inpatient Hospital Stay: Payer: PPO | Admitting: Hematology

## 2020-05-27 ENCOUNTER — Inpatient Hospital Stay: Payer: PPO

## 2020-05-27 ENCOUNTER — Encounter: Payer: Self-pay | Admitting: Hematology

## 2020-05-27 VITALS — BP 126/74 | HR 80 | Temp 97.5°F | Resp 18 | Ht 60.0 in | Wt 131.4 lb

## 2020-05-27 DIAGNOSIS — Z170 Estrogen receptor positive status [ER+]: Secondary | ICD-10-CM | POA: Diagnosis not present

## 2020-05-27 DIAGNOSIS — C50112 Malignant neoplasm of central portion of left female breast: Secondary | ICD-10-CM | POA: Diagnosis not present

## 2020-05-27 LAB — CBC WITH DIFFERENTIAL/PLATELET
Abs Immature Granulocytes: 0.01 10*3/uL (ref 0.00–0.07)
Basophils Absolute: 0.1 10*3/uL (ref 0.0–0.1)
Basophils Relative: 1 %
Eosinophils Absolute: 0.7 10*3/uL — ABNORMAL HIGH (ref 0.0–0.5)
Eosinophils Relative: 17 %
HCT: 37.5 % (ref 36.0–46.0)
Hemoglobin: 12.2 g/dL (ref 12.0–15.0)
Immature Granulocytes: 0 %
Lymphocytes Relative: 20 %
Lymphs Abs: 0.8 10*3/uL (ref 0.7–4.0)
MCH: 32.2 pg (ref 26.0–34.0)
MCHC: 32.5 g/dL (ref 30.0–36.0)
MCV: 98.9 fL (ref 80.0–100.0)
Monocytes Absolute: 0.4 10*3/uL (ref 0.1–1.0)
Monocytes Relative: 10 %
Neutro Abs: 2 10*3/uL (ref 1.7–7.7)
Neutrophils Relative %: 52 %
Platelets: 144 10*3/uL — ABNORMAL LOW (ref 150–400)
RBC: 3.79 MIL/uL — ABNORMAL LOW (ref 3.87–5.11)
RDW: 12.7 % (ref 11.5–15.5)
WBC: 3.9 10*3/uL — ABNORMAL LOW (ref 4.0–10.5)
nRBC: 0 % (ref 0.0–0.2)

## 2020-05-27 LAB — COMPREHENSIVE METABOLIC PANEL
ALT: 14 U/L (ref 0–44)
AST: 23 U/L (ref 15–41)
Albumin: 3.6 g/dL (ref 3.5–5.0)
Alkaline Phosphatase: 76 U/L (ref 38–126)
Anion gap: 10 (ref 5–15)
BUN: 21 mg/dL (ref 8–23)
CO2: 26 mmol/L (ref 22–32)
Calcium: 9.9 mg/dL (ref 8.9–10.3)
Chloride: 108 mmol/L (ref 98–111)
Creatinine, Ser: 0.96 mg/dL (ref 0.44–1.00)
GFR calc Af Amer: >60 mL/min (ref >60)
GFR calc non Af Amer: 52 mL/min — ABNORMAL LOW (ref >60)
Glucose, Bld: 87 mg/dL (ref 70–99)
Potassium: 3.7 mmol/L (ref 3.5–5.1)
Sodium: 144 mmol/L (ref 135–145)
Total Bilirubin: 0.4 mg/dL (ref 0.3–1.2)
Total Protein: 6.3 g/dL — ABNORMAL LOW (ref 6.5–8.1)

## 2020-05-27 MED ORDER — FULVESTRANT 250 MG/5ML IM SOLN
500.0000 mg | INTRAMUSCULAR | Status: DC
  Administered 2020-05-27: 500 mg via INTRAMUSCULAR

## 2020-05-27 MED ORDER — FULVESTRANT 250 MG/5ML IM SOLN
INTRAMUSCULAR | Status: AC
  Filled 2020-05-27: qty 5

## 2020-05-27 NOTE — Patient Instructions (Signed)
Fulvestrant injection What is this medicine? FULVESTRANT (ful VES trant) blocks the effects of estrogen. It is used to treat breast cancer. This medicine may be used for other purposes; ask your health care provider or pharmacist if you have questions. COMMON BRAND NAME(S): FASLODEX What should I tell my health care provider before I take this medicine? They need to know if you have any of these conditions:  bleeding disorders  liver disease  low blood counts, like low white cell, platelet, or red cell counts  an unusual or allergic reaction to fulvestrant, other medicines, foods, dyes, or preservatives  pregnant or trying to get pregnant  breast-feeding How should I use this medicine? This medicine is for injection into a muscle. It is usually given by a health care professional in a hospital or clinic setting. Talk to your pediatrician regarding the use of this medicine in children. Special care may be needed. Overdosage: If you think you have taken too much of this medicine contact a poison control center or emergency room at once. NOTE: This medicine is only for you. Do not share this medicine with others. What if I miss a dose? It is important not to miss your dose. Call your doctor or health care professional if you are unable to keep an appointment. What may interact with this medicine?  medicines that treat or prevent blood clots like warfarin, enoxaparin, dalteparin, apixaban, dabigatran, and rivaroxaban This list may not describe all possible interactions. Give your health care provider a list of all the medicines, herbs, non-prescription drugs, or dietary supplements you use. Also tell them if you smoke, drink alcohol, or use illegal drugs. Some items may interact with your medicine. What should I watch for while using this medicine? Your condition will be monitored carefully while you are receiving this medicine. You will need important blood work done while you are taking  this medicine. Do not become pregnant while taking this medicine or for at least 1 year after stopping it. Women of child-bearing potential will need to have a negative pregnancy test before starting this medicine. Women should inform their doctor if they wish to become pregnant or think they might be pregnant. There is a potential for serious side effects to an unborn child. Men should inform their doctors if they wish to father a child. This medicine may lower sperm counts. Talk to your health care professional or pharmacist for more information. Do not breast-feed an infant while taking this medicine or for 1 year after the last dose. What side effects may I notice from receiving this medicine? Side effects that you should report to your doctor or health care professional as soon as possible:  allergic reactions like skin rash, itching or hives, swelling of the face, lips, or tongue  feeling faint or lightheaded, falls  pain, tingling, numbness, or weakness in the legs  signs and symptoms of infection like fever or chills; cough; flu-like symptoms; sore throat  vaginal bleeding Side effects that usually do not require medical attention (report to your doctor or health care professional if they continue or are bothersome):  aches, pains  constipation  diarrhea  headache  hot flashes  nausea, vomiting  pain at site where injected  stomach pain This list may not describe all possible side effects. Call your doctor for medical advice about side effects. You may report side effects to FDA at 1-800-FDA-1088. Where should I keep my medicine? This drug is given in a hospital or clinic and will   not be stored at home. NOTE: This sheet is a summary. It may not cover all possible information. If you have questions about this medicine, talk to your doctor, pharmacist, or health care provider.  2020 Elsevier/Gold Standard (2018-01-24 11:34:41)  

## 2020-05-28 LAB — CANCER ANTIGEN 27.29: CA 27.29: 57.1 U/mL — ABNORMAL HIGH (ref 0.0–38.6)

## 2020-06-03 DIAGNOSIS — Z03818 Encounter for observation for suspected exposure to other biological agents ruled out: Secondary | ICD-10-CM | POA: Diagnosis not present

## 2020-06-03 DIAGNOSIS — Z20822 Contact with and (suspected) exposure to covid-19: Secondary | ICD-10-CM | POA: Diagnosis not present

## 2020-06-14 ENCOUNTER — Other Ambulatory Visit: Payer: Self-pay | Admitting: Adult Health

## 2020-06-17 ENCOUNTER — Encounter: Payer: Self-pay | Admitting: Genetic Counselor

## 2020-06-21 DIAGNOSIS — T7840XA Allergy, unspecified, initial encounter: Secondary | ICD-10-CM | POA: Diagnosis not present

## 2020-06-21 DIAGNOSIS — I10 Essential (primary) hypertension: Secondary | ICD-10-CM | POA: Diagnosis not present

## 2020-06-21 DIAGNOSIS — R609 Edema, unspecified: Secondary | ICD-10-CM | POA: Diagnosis not present

## 2020-06-21 DIAGNOSIS — R110 Nausea: Secondary | ICD-10-CM | POA: Diagnosis not present

## 2020-06-21 DIAGNOSIS — R0602 Shortness of breath: Secondary | ICD-10-CM | POA: Diagnosis not present

## 2020-06-22 ENCOUNTER — Telehealth: Payer: Self-pay | Admitting: Hematology

## 2020-06-22 NOTE — Telephone Encounter (Signed)
R/s apt per 8/23 schmsg - left message for patient with appt date and time

## 2020-06-24 ENCOUNTER — Inpatient Hospital Stay: Payer: PPO

## 2020-06-25 ENCOUNTER — Encounter: Payer: Self-pay | Admitting: Pharmacy Technician

## 2020-06-25 NOTE — Progress Notes (Signed)
Patient has been approved for drug assistance by [facility] for Faslodex. The enrollment period is from 06/22/20-10/29/20 based on EOOP. First DOS covered is 07/01/20.

## 2020-07-01 ENCOUNTER — Inpatient Hospital Stay: Payer: PPO

## 2020-07-01 ENCOUNTER — Emergency Department (HOSPITAL_COMMUNITY)
Admission: EM | Admit: 2020-07-01 | Discharge: 2020-07-01 | Disposition: A | Discharge status: Home / Self Care | Payer: PPO | Attending: Emergency Medicine | Admitting: Emergency Medicine

## 2020-07-01 ENCOUNTER — Other Ambulatory Visit: Payer: Self-pay

## 2020-07-01 ENCOUNTER — Emergency Department (HOSPITAL_COMMUNITY): Payer: PPO

## 2020-07-01 ENCOUNTER — Encounter (HOSPITAL_COMMUNITY): Payer: Self-pay

## 2020-07-01 DIAGNOSIS — Z5321 Procedure and treatment not carried out due to patient leaving prior to being seen by health care provider: Secondary | ICD-10-CM | POA: Diagnosis not present

## 2020-07-01 DIAGNOSIS — R0789 Other chest pain: Secondary | ICD-10-CM | POA: Insufficient documentation

## 2020-07-01 DIAGNOSIS — R110 Nausea: Secondary | ICD-10-CM | POA: Diagnosis not present

## 2020-07-01 NOTE — ED Notes (Signed)
Pt left AMA at this time, pt aware of all risks involved with leaving AMA at this time. Pt aox4 ambulatory to baseline.

## 2020-07-01 NOTE — ED Triage Notes (Signed)
Pt arrived via walk in, was going to cancer center for tx, right before tx, started with 9/10 left sided intermittent chest pain, radiating to left shoulder, and nausea.

## 2020-07-02 ENCOUNTER — Telehealth: Payer: Self-pay

## 2020-07-02 NOTE — Telephone Encounter (Signed)
Gloria [name] left a message stating she didn't feel good yesterday and went to the ED instead of getting her faslodex.  I called Gloria [name]. She told me she got very nauseated yesterday while waiting in the lobby for her appt here.  ED notes state she also had left sided chest pain.  She states she started to feel better so they left. She states she had the covid booster shot 7 days ago and feels that is the reason for her symptoms. I recommended that she be evaluated by her PCP.  She does not want to reschedule the missed faslodex injection.  I reviewed her next appt date and time.  She verbalized understanding to all of the above.

## 2020-07-22 ENCOUNTER — Inpatient Hospital Stay: Payer: PPO | Attending: Hematology

## 2020-07-22 ENCOUNTER — Other Ambulatory Visit: Payer: Self-pay

## 2020-07-22 VITALS — BP 129/84 | HR 68 | Temp 97.6°F | Resp 18

## 2020-07-22 DIAGNOSIS — Z79818 Long term (current) use of other agents affecting estrogen receptors and estrogen levels: Secondary | ICD-10-CM | POA: Diagnosis not present

## 2020-07-22 DIAGNOSIS — Z170 Estrogen receptor positive status [ER+]: Secondary | ICD-10-CM | POA: Diagnosis not present

## 2020-07-22 DIAGNOSIS — C50112 Malignant neoplasm of central portion of left female breast: Secondary | ICD-10-CM | POA: Insufficient documentation

## 2020-07-22 MED ORDER — FULVESTRANT 250 MG/5ML IM SOLN
500.0000 mg | INTRAMUSCULAR | Status: DC
  Administered 2020-07-22: 500 mg via INTRAMUSCULAR

## 2020-07-22 NOTE — Patient Instructions (Signed)
Fulvestrant injection What is this medicine? FULVESTRANT (ful VES trant) blocks the effects of estrogen. It is used to treat breast cancer. This medicine may be used for other purposes; ask your health care provider or pharmacist if you have questions. COMMON BRAND NAME(S): FASLODEX What should I tell my health care provider before I take this medicine? They need to know if you have any of these conditions:  bleeding disorders  liver disease  low blood counts, like low white cell, platelet, or red cell counts  an unusual or allergic reaction to fulvestrant, other medicines, foods, dyes, or preservatives  pregnant or trying to get pregnant  breast-feeding How should I use this medicine? This medicine is for injection into a muscle. It is usually given by a health care professional in a hospital or clinic setting. Talk to your pediatrician regarding the use of this medicine in children. Special care may be needed. Overdosage: If you think you have taken too much of this medicine contact a poison control center or emergency room at once. NOTE: This medicine is only for you. Do not share this medicine with others. What if I miss a dose? It is important not to miss your dose. Call your doctor or health care professional if you are unable to keep an appointment. What may interact with this medicine?  medicines that treat or prevent blood clots like warfarin, enoxaparin, dalteparin, apixaban, dabigatran, and rivaroxaban This list may not describe all possible interactions. Give your health care provider a list of all the medicines, herbs, non-prescription drugs, or dietary supplements you use. Also tell them if you smoke, drink alcohol, or use illegal drugs. Some items may interact with your medicine. What should I watch for while using this medicine? Your condition will be monitored carefully while you are receiving this medicine. You will need important blood work done while you are taking  this medicine. Do not become pregnant while taking this medicine or for at least 1 year after stopping it. Women of child-bearing potential will need to have a negative pregnancy test before starting this medicine. Women should inform their doctor if they wish to become pregnant or think they might be pregnant. There is a potential for serious side effects to an unborn child. Men should inform their doctors if they wish to father a child. This medicine may lower sperm counts. Talk to your health care professional or pharmacist for more information. Do not breast-feed an infant while taking this medicine or for 1 year after the last dose. What side effects may I notice from receiving this medicine? Side effects that you should report to your doctor or health care professional as soon as possible:  allergic reactions like skin rash, itching or hives, swelling of the face, lips, or tongue  feeling faint or lightheaded, falls  pain, tingling, numbness, or weakness in the legs  signs and symptoms of infection like fever or chills; cough; flu-like symptoms; sore throat  vaginal bleeding Side effects that usually do not require medical attention (report to your doctor or health care professional if they continue or are bothersome):  aches, pains  constipation  diarrhea  headache  hot flashes  nausea, vomiting  pain at site where injected  stomach pain This list may not describe all possible side effects. Call your doctor for medical advice about side effects. You may report side effects to FDA at 1-800-FDA-1088. Where should I keep my medicine? This drug is given in a hospital or clinic and will   not be stored at home. NOTE: This sheet is a summary. It may not cover all possible information. If you have questions about this medicine, talk to your doctor, pharmacist, or health care provider.  2020 Elsevier/Gold Standard (2018-01-24 11:34:41)  

## 2020-08-16 NOTE — Progress Notes (Signed)
[place]   [phone] Fax:(336) [phone]   Clinic Follow up Note   Patient Care Team: Lajean Manes, MD as PCP - Nicholas, [place], MD as Consulting Physician (General Surgery) Truitt Merle, MD as Consulting Physician (Hematology) [name], MD as Consulting Physician (Radiation Oncology) Sylvan Cheese, NP as Nurse Practitioner (Hematology and Oncology) Karen Kays, NP as Nurse Practitioner (Nurse Practitioner)  Date of Service:  08/19/2020  CHIEF COMPLAINT: F/uinvasive lobular carcinoma of the left breast  SUMMARY OF ONCOLOGIC HISTORY: Oncology History Overview Note  Cancer of central portion of left female breast [facility])   Staging form: Breast, AJCC 7th Edition     Pathologic stage from 08/04/2010: Stage IIB (T2, N1a, cM0) - Signed by Truitt Merle, MD on 12/09/2015     Cancer of central portion of left female breast ([place])  08/03/2010 Mammogram    left nipple retraction, and the palpable mass subareolar left breast at the 6:00 position. Ultrasound confirmed the presence of a mass measuring 2.6 x 1.7 x 2.4 cm   08/04/2010 Initial Biopsy   left breast mass biopsy showed an invasive mammary carcinoma with lobular features. ER 95%, PR 97%, Ki-67 17%, HER-2/neu (-)   08/04/2010 Pathologic Stage   Stage IIB: T2 N1a   08/11/2010 Imaging   left breast mass 4.6 x 4.2 x 2.3 cm with enhancement distortion extending to the nipple retraction. There is questionable anterior mediastinal lymph node seen   07/2010 - 09/03/2010 Anti-estrogen oral therapy   neoadjuvant letrozole    09/03/2010 Surgery    left breast mastectomy with sentinel node biopsy on 09/03/2010.   08/2010 - 01/2015 Anti-estrogen oral therapy   Tamoxifen, pt stopped on her own    12/03/2010 - 01/23/2011 Radiation Therapy   left chest wall and axilla radiaiton    10/19/2015 Progression   Left chest wall recurrence, s/p resection on 11/25/2015 with positive margins ([facility])     12/10/2015 - 05/31/2017 Anti-estrogen oral therapy   anastrozole 2m daily, which was switched to Tamoxifen on 06/30/2016 due to persistent disease.   01/03/2016 - 03/03/2016 Radiation Therapy   Radiation to left chest wall recurrence (12/06/2015-02/04/2016 - 44 Gy), pt progressed through the radiation, and had additional 10 fractions of radiation (02/21/16-03/03/2016 - total 70 Gy to the left chest wall)   07/14/2016 PET scan   PET 07/14/2016 IMPRESSION: 1. No findings of recurrent malignancy. 2. Radiation fibrosis in the lingula. A small left axillary lymph node is not hypermetabolic and only 6 mm in diameter. This is in the vicinity of the prior axillary cyst. 3. Pelvic floor laxity.   07/17/2016 Surgery   Chest wall soft tissue resection for local recurrence   07/17/2016 Pathology Results   Left chest wall two soft tissue resection both showed invasive lobular carcinoma, margins were positive. ER 95% positive, PR 60% positive, HER-2 negative.   07/27/2016 Imaging   CT chest, abdomen and pelvis wo contrast  07/27/2016 IMPRESSION: Sigmoid diverticulosis without acute diverticulitis. No bowel obstruction or acute inflammation. Degenerative disc disease and facet arthropathy of the lower lumbar spine with slight grade 1 anterolisthesis of L4 on L5. Stable appearing simple left-sided renal cysts.   03/01/2017 Mammogram   Mammogram 03/01/2017 IMPRESSION: No mammographic evidence of malignancy. A result letter of this screening mammogram will be mailed directly to the patient. RECOMMENDATION: Screening mammogram in one year   04/10/2017 Imaging   PET IMPRESSION: 1. Stable exam.  No findings of recurrent malignancy. 2. Changes of  external beam radiation again noted within the lingula. 3. Small left axillary lymph node exhibits mild increased uptake. Unchanged from previous exam.   06/07/2017 -  Anti-estrogen oral therapy   Stopped Tamoxifen and Began monthly Faslodex IM injection on 06/07/17  -Held  after 09/26/19 due to lost f/u. Restart with loading dose on 04/29/20.    09/19/2017 Imaging   CT CAP WO Contrast 09/19/17 IMPRESSION: 1. Surgical changes from a left mastectomy without CT findings to suggest recurrent chest wall tumor or axillary lymphadenopathy. 2. No findings to suggest metastatic disease involving the neck, chest, abdomen or pelvis or osseous structures. 3. Stable radiation changes involving the lingula.    09/19/2017 Imaging   Bone Scan Whole Body  IMPRESSION: No definite scintigraphic evidence of osseous metastatic disease. Scattered degenerative type uptake as above.   03/11/2018 PET scan   IMPRESSION:  1. No evidence of recurrent disease. 2.  Aortic atherosclerosis    09/03/2018 PET scan   09/03/2018 PET Scan IMPRESSION: 1. No findings to suggest locally recurrent disease or metastatic disease in the neck, chest, abdomen or pelvis. 2. Hepatic steatosis. 3. Colonic diverticulosis without evidence of acute diverticulitis at this time. 4. Aortic atherosclerosis. 5. Additional incidental findings, as above.   11/25/2018 Imaging   DG Ribs Unilateral W/Chest Left  IMPRESSION: Stable left lingular scarring. Normal left ribs. No acute cardiopulmonary abnormality seen.    04/04/2019 PET scan   PET  IMPRESSION: 1. No evidence of hypermetabolic recurrent or metastatic disease. 2. Hepatic steatosis. 3.  Aortic Atherosclerosis (ICD10-I70.0).   05/18/2020 PET scan   IMPRESSION: 1. Findings of LEFT mastectomy and axillary dissection with radiation changes, no signs of metastatic disease or disease recurrence at this time. 2. Hepatic steatosis. 3. Aortic atherosclerosis.   Aortic Atherosclerosis (ICD10-I70.0).        CURRENT THERAPY:  Monthly Faslodex injection started on 06/07/17. Held after 09/26/19 due to lost f/u. Restart with loading dose (every 2 weeks for 3 injections) on 04/29/20.  INTERVAL HISTORY:  Gloria [name] is here for a follow up of left  breast cancer. She presents to the clinic alone. She is clinically doing well. She reports frequent urination since this morning, no dysuria or hematuria. No fever or chills. She also reports pain across left laterla lower chest.  Her lift on stairs has been broken lately and she has been walking up and down stairs at home, she thought the chest pain could be related to that.  No shortness of breath or other new symptoms.  Mild fatigue is stable, she is able to function well at home.  Review of system otherwise negative  All other systems were reviewed with the patient and are negative.  MEDICAL HISTORY:  Past Medical History:  Diagnosis Date  . Anxiety   . Blood in urine   . CAD (coronary artery disease)    non obstructive by cath  . Cancer [facility])    breast- left  . Depression   . Family history of breast cancer   . Family history of colon cancer   . Family history of ovarian cancer   . Family history of pancreatic cancer   . Family history of prostate cancer   . GERD (gastroesophageal reflux disease)   . Hypertension   . Knee fracture   . Personal history of radiation therapy 2017  . TR (tricuspid regurgitation)    mild by Echo 12/2008 EF >55%    SURGICAL HISTORY: Past Surgical History:  Procedure Laterality Date  .  ABDOMINAL AORTAGRAM N/A 09/08/2011   Procedure: ABDOMINAL Maxcine Ham;  Surgeon: Troy Sine, MD;  Location: [facility] CATH LAB;  Service: Cardiovascular;  Laterality: N/A;  . ABDOMINAL HYSTERECTOMY    . BREAST EXCISIONAL BIOPSY Left 2017   X3  . BREAST SURGERY  08-30-10   mastectomy  . CARDIAC CATHETERIZATION  08/2011   20% LAD stenosis, 20% diagonal stenosis, 10-20% proximal dominant RCA stenosis  . COLON SURGERY    . LEFT HEART CATHETERIZATION WITH CORONARY ANGIOGRAM N/A 09/08/2011   Procedure: LEFT HEART CATHETERIZATION WITH CORONARY ANGIOGRAM;  Surgeon: Troy Sine, MD;  Location: [facility] CATH LAB;  Service: Cardiovascular;  Laterality: N/A;  . MASTECTOMY    . MINOR  BREAST BIOPSY Left 11/25/2015   Procedure: MINOR EXCISION MASS LEFT CHEST WALL;  Surgeon: Autumn Messing III, MD;  Location: [place];  Service: General;  Laterality: Left;  . MINOR BREAST BIOPSY Left 07/17/2016   Procedure: EXCISION OF 2 LEFT CHEST WALL MASSES;  Surgeon: Autumn Messing III, MD;  Location: [place];  Service: General;  Laterality: Left;  . TONSILLECTOMY      I have reviewed the social history and family history with the patient and they are unchanged from previous note.  ALLERGIES:  is allergic to contrast media [iodinated diagnostic agents], penicillins, adhesive [tape], cephalexin, ciprofloxacin, demerol, meperidine hcl, sulfa antibiotics, sulfamethoxazole-trimethoprim, and quinolones.  MEDICATIONS:  Current Outpatient Medications  Medication Sig Dispense Refill  . acetaminophen (TYLENOL) 500 MG tablet Take 1,000 mg by mouth daily as needed for moderate pain. Reported on 03/10/2016    . ALPRAZolam (XANAX) 0.25 MG tablet TAKE 1/2 (ONE-HALF) TABLET BY MOUTH IN THE MORNING AND 1 AT BEDTIME AS NEEDED 30 tablet 0  . amLODipine (NORVASC) 2.5 MG tablet amlodipine 2.5 mg tablet  TK 1 T PO QD    . aspirin 81 MG chewable tablet Chew 81 mg by mouth once a week. Reported on 03/10/2016    . CALCIUM-MAGNESIUM-VITAMIN D PO Take 1 tablet by mouth 2 (two) times daily.    Marland Kitchen doxepin (SINEQUAN) 10 MG capsule Take 10 mg by mouth at bedtime.     Marland Kitchen doxycycline (VIBRA-TABS) 100 MG tablet Take 100 mg by mouth 2 (two) times daily.    . DULoxetine (CYMBALTA) 20 MG capsule Take 1 capsule (20 mg total) by mouth daily. 30 capsule 1  . fluticasone (FLONASE) 50 MCG/ACT nasal spray Place 1 spray into both nostrils daily as needed for allergies.     Marland Kitchen guaiFENesin (MUCINEX) 600 MG 12 hr tablet Take 600 mg by mouth daily as needed for cough or to loosen phlegm. Reported on 03/28/2016    . hydrochlorothiazide (MICROZIDE) 12.5 MG capsule Take 1 capsule (12.5 mg total) by mouth daily. 90  capsule 3  . losartan (COZAAR) 25 MG tablet 25 mg at bedtime.     Marland Kitchen losartan (COZAAR) 50 MG tablet Take 1 tablet by mouth once daily 90 tablet 1  . nitroGLYCERIN (NITROSTAT) 0.4 MG SL tablet Place 1 tablet (0.4 mg total) under the tongue every 5 (five) minutes as needed for chest pain. <PLEASE MAKE APPOINTMENT> 25 tablet 3  . omeprazole (PRILOSEC) 20 MG capsule omeprazole 20 mg capsule,delayed release    . pseudoephedrine-acetaminophen (TYLENOL SINUS) 30-500 MG TABS tablet Take 1 tablet by mouth every 4 (four) hours as needed.    . senna (SENOKOT) 8.6 MG tablet Take 1 tablet by mouth every other day.    . triamcinolone (KENALOG) 0.025 % cream Apply  1 application topically 2 (two) times daily. 30 g 0   No current facility-administered medications for this visit.   Facility-Administered Medications Ordered in Other Visits  Medication Dose Route Frequency Provider Last Rate Last Admin  . fulvestrant (FASLODEX) injection 500 mg  500 mg Intramuscular Q30 days Truitt Merle, MD   500 mg at 08/19/20 1516    PHYSICAL EXAMINATION: ECOG PERFORMANCE STATUS: 2 - Symptomatic, <50% confined to bed  Vitals:   08/19/20 1407  BP: 132/88  Pulse: 86  Resp: 17  Temp: (!) 97.2 F (36.2 C)  SpO2: 99%   Filed Weights   08/19/20 1407  Weight: 134 lb 8 oz (61 kg)    GENERAL:alert, no distress and comfortable SKIN: skin color, texture, turgor are normal, no rashes or significant lesions EYES: normal, Conjunctiva are pink and non-injected, sclera clear NECK: supple, thyroid normal size, non-tender, without nodularity LYMPH:  no palpable lymphadenopathy in the cervical, axillary  LUNGS: clear to auscultation and percussion with normal breathing effort HEART: regular rate & rhythm and no murmurs and no lower extremity edema ABDOMEN:abdomen soft, non-tender and normal bowel sounds Musculoskeletal:no cyanosis of digits and no clubbing  NEURO: alert & oriented x 3 with fluent speech, no focal motor/sensory  deficits Breasts: Breast inspection showed status post left mastectomy, with excessive soft tissue around incision line, and a few palpable nodules along the incision line. Palpation of the right breast and axilla revealed no obvious mass that I could appreciate.  LABORATORY DATA:  I have reviewed the data as listed CBC Latest Ref Rng & Units 08/19/2020 05/27/2020 05/13/2020  WBC 4.0 - 10.5 K/uL 4.6 3.9(L) 4.7  Hemoglobin 12.0 - 15.0 g/dL 11.9(L) 12.2 12.6  Hematocrit 36 - 46 % 36.5 37.5 38.2  Platelets 150 - 400 K/uL 156 144(L) 180     CMP Latest Ref Rng & Units 08/19/2020 05/27/2020 05/13/2020  Glucose 70 - 99 mg/dL 97 87 81  BUN 8 - 23 mg/dL _0 Creatinine 0.44 - 1.00 mg/dL 0.90 0.96 0.97  Sodium 135 - 145 mmol/L 141 144 141  Potassium 3.5 - 5.1 mmol/L 3.8 3.7 3.8  Chloride 98 - 111 mmol/L 106 108 105  CO2 22 - 32 mmol/L _1 Calcium 8.9 - 10.3 mg/dL 9.6 9.9 9.7  Total Protein 6.5 - 8.1 g/dL 6.6 6.3(L) 6.7  Total Bilirubin 0.3 - 1.2 mg/dL 0.3 0.4 0.5  Alkaline Phos 38 - 126 U/L 85 76 75  AST 15 - 41 U/L _2 ALT 0 - 44 U/L _3 RADIOGRAPHIC STUDIES: I have personally reviewed the radiological images as listed and agreed with the findings in the report. No results found.   ASSESSMENT & PLAN:  Gloria [name] is a 86 y.o. female with    1. Cancer of the central portion of left female breast, Stage IIB, T2 N1 invasive lobular carcinoma, grade 2, ER 95%, PR 97%, Ki-67 17%, HER-2/neu no amplification, diagnosed in 07/2010, local chest wall recurrence in 09/2015  -She wasdiagnosed in 10/2011but hadchest wall local recurrence in 09/2015.She was on adjuvant tamoxifen for 4.5 years, stopped on her own anddeveloped local recurrence 8 months after she stopped tamoxifen. -She has completed chest wall radiation, however developedlocal recurrence again andunderwent a second chest wall surgery for two soft tissue resection in 06/2016. Unfortunately the  surgicalmargins were positive. Dr. Reggie Pile not offer more surgerybecause complete surgical resection wasunlikely. Pt agreed.  -She has  been onmonthlyfulvestrant injectionstarting8/9/18due to clinical suspicion for disease progress.Due to lost follow up and miscommunications she was off treatment 09/26/19-04/29/20. Will continue every 4 weeks.  -She is clinically stable, new onset left lateral chest wall pain and tenderness likely muscular in nature, I encouraged her to use heating pad and take Tylenol or ibuprofen as needed -Lab reviewed CBC and CMP are unremarkable.  We will proceed fulvestrant injection today and continue every 4 weeks -Follow-up in 3 months with repeated a PET scan   2.Left chest wall pain  -Secondary to breast surgery -Due to dizziness she is no longer on Cymbalta. Dr. Felipa Eth previously had her on Doxepin. -new chest wall pain from lateral chest to back, likely muscular pain  3. HTN, CAD  -She'll continue follow-up with her primary care physician and cardiologist  4. Osteoporosis  -Her last DEXA scan in 06/2012 showed osteoporosis  -I previously encouraged her to continue calcium and Vit D -I previously encouraged her to repeat DEXA at her PCP office   5. Goal of care discussion  -The patient understands the goal of care is palliative. -she is full code for now  6.  Urinary frequency -UA today was negative.  Urine culture is pending -will hold on antibiotics now unless culture positive    Plan -Lab reviewed, will proceed fulvestrant injection today and continue every 4 weeks -Lab and F/u in 12 weeks  with PET scan a few days ago   No problem-specific Assessment & Plan notes found for this encounter.   Orders Placed This Encounter  Procedures  . NM PET Image Restag (PS) Skull Base To Thigh    Standing Status:   Future    Standing Expiration Date:   08/19/2021    Order Specific Question:   If indicated for the ordered procedure, I  authorize the administration of a radiopharmaceutical per Radiology protocol    Answer:   Yes    Order Specific Question:   Preferred imaging location?    Answer:   [facility]   All questions were answered. The patient knows to call the clinic with any problems, questions or concerns. No barriers to learning was detected. The total time spent in the appointment was 30 minutes.     Truitt Merle, MD 08/19/2020   I, Joslyn Devon, am acting as scribe for Truitt Merle, MD.   I have reviewed the above documentation for accuracy and completeness, and I agree with the above.

## 2020-08-19 ENCOUNTER — Telehealth: Payer: Self-pay | Admitting: Hematology

## 2020-08-19 ENCOUNTER — Other Ambulatory Visit: Payer: Self-pay | Admitting: Genetic Counselor

## 2020-08-19 ENCOUNTER — Other Ambulatory Visit: Payer: Self-pay

## 2020-08-19 ENCOUNTER — Ambulatory Visit: Payer: PPO

## 2020-08-19 ENCOUNTER — Inpatient Hospital Stay: Payer: PPO

## 2020-08-19 ENCOUNTER — Inpatient Hospital Stay: Payer: PPO | Admitting: Hematology

## 2020-08-19 ENCOUNTER — Encounter: Payer: Self-pay | Admitting: Genetic Counselor

## 2020-08-19 ENCOUNTER — Inpatient Hospital Stay: Payer: PPO | Attending: Hematology

## 2020-08-19 ENCOUNTER — Encounter: Payer: Self-pay | Admitting: Hematology

## 2020-08-19 ENCOUNTER — Inpatient Hospital Stay (HOSPITAL_BASED_OUTPATIENT_CLINIC_OR_DEPARTMENT_OTHER): Payer: PPO | Admitting: Genetic Counselor

## 2020-08-19 VITALS — BP 140/90 | HR 71 | Resp 18

## 2020-08-19 VITALS — BP 132/88 | HR 86 | Temp 97.2°F | Resp 17 | Ht 60.0 in | Wt 134.5 lb

## 2020-08-19 DIAGNOSIS — I10 Essential (primary) hypertension: Secondary | ICD-10-CM | POA: Diagnosis not present

## 2020-08-19 DIAGNOSIS — R0789 Other chest pain: Secondary | ICD-10-CM | POA: Diagnosis not present

## 2020-08-19 DIAGNOSIS — Z7982 Long term (current) use of aspirin: Secondary | ICD-10-CM | POA: Insufficient documentation

## 2020-08-19 DIAGNOSIS — C50112 Malignant neoplasm of central portion of left female breast: Secondary | ICD-10-CM

## 2020-08-19 DIAGNOSIS — N2889 Other specified disorders of kidney and ureter: Secondary | ICD-10-CM | POA: Insufficient documentation

## 2020-08-19 DIAGNOSIS — F418 Other specified anxiety disorders: Secondary | ICD-10-CM | POA: Insufficient documentation

## 2020-08-19 DIAGNOSIS — M810 Age-related osteoporosis without current pathological fracture: Secondary | ICD-10-CM | POA: Diagnosis not present

## 2020-08-19 DIAGNOSIS — C50012 Malignant neoplasm of nipple and areola, left female breast: Secondary | ICD-10-CM | POA: Insufficient documentation

## 2020-08-19 DIAGNOSIS — Z800 Family history of malignant neoplasm of digestive organs: Secondary | ICD-10-CM

## 2020-08-19 DIAGNOSIS — Z79811 Long term (current) use of aromatase inhibitors: Secondary | ICD-10-CM | POA: Diagnosis not present

## 2020-08-19 DIAGNOSIS — I700 Atherosclerosis of aorta: Secondary | ICD-10-CM | POA: Diagnosis not present

## 2020-08-19 DIAGNOSIS — R300 Dysuria: Secondary | ICD-10-CM

## 2020-08-19 DIAGNOSIS — Z170 Estrogen receptor positive status [ER+]: Secondary | ICD-10-CM | POA: Diagnosis not present

## 2020-08-19 DIAGNOSIS — Z79899 Other long term (current) drug therapy: Secondary | ICD-10-CM | POA: Diagnosis not present

## 2020-08-19 DIAGNOSIS — Z923 Personal history of irradiation: Secondary | ICD-10-CM | POA: Insufficient documentation

## 2020-08-19 DIAGNOSIS — Z79818 Long term (current) use of other agents affecting estrogen receptors and estrogen levels: Secondary | ICD-10-CM | POA: Diagnosis not present

## 2020-08-19 DIAGNOSIS — Z8042 Family history of malignant neoplasm of prostate: Secondary | ICD-10-CM | POA: Diagnosis not present

## 2020-08-19 DIAGNOSIS — Z803 Family history of malignant neoplasm of breast: Secondary | ICD-10-CM | POA: Diagnosis not present

## 2020-08-19 DIAGNOSIS — I2510 Atherosclerotic heart disease of native coronary artery without angina pectoris: Secondary | ICD-10-CM | POA: Diagnosis not present

## 2020-08-19 DIAGNOSIS — R5383 Other fatigue: Secondary | ICD-10-CM | POA: Insufficient documentation

## 2020-08-19 DIAGNOSIS — K219 Gastro-esophageal reflux disease without esophagitis: Secondary | ICD-10-CM | POA: Diagnosis not present

## 2020-08-19 DIAGNOSIS — Z9012 Acquired absence of left breast and nipple: Secondary | ICD-10-CM | POA: Diagnosis not present

## 2020-08-19 DIAGNOSIS — Z8041 Family history of malignant neoplasm of ovary: Secondary | ICD-10-CM | POA: Diagnosis not present

## 2020-08-19 LAB — URINALYSIS, COMPLETE (UACMP) WITH MICROSCOPIC
Bacteria, UA: NONE SEEN
Bilirubin Urine: NEGATIVE
Glucose, UA: NEGATIVE mg/dL
Hgb urine dipstick: NEGATIVE
Ketones, ur: NEGATIVE mg/dL
Nitrite: NEGATIVE
Protein, ur: NEGATIVE mg/dL
Specific Gravity, Urine: 1.011 (ref 1.005–1.030)
pH: 5 (ref 5.0–8.0)

## 2020-08-19 LAB — CMP (CANCER CENTER ONLY)
ALT: 14 U/L (ref 0–44)
AST: 22 U/L (ref 15–41)
Albumin: 3.7 g/dL (ref 3.5–5.0)
Alkaline Phosphatase: 85 U/L (ref 38–126)
Anion gap: 8 (ref 5–15)
BUN: 19 mg/dL (ref 8–23)
CO2: 27 mmol/L (ref 22–32)
Calcium: 9.6 mg/dL (ref 8.9–10.3)
Chloride: 106 mmol/L (ref 98–111)
Creatinine: 0.9 mg/dL (ref 0.44–1.00)
GFR, Estimated: >60 mL/min (ref >60)
Glucose, Bld: 97 mg/dL (ref 70–99)
Potassium: 3.8 mmol/L (ref 3.5–5.1)
Sodium: 141 mmol/L (ref 135–145)
Total Bilirubin: 0.3 mg/dL (ref 0.3–1.2)
Total Protein: 6.6 g/dL (ref 6.5–8.1)

## 2020-08-19 LAB — CBC WITH DIFFERENTIAL (CANCER CENTER ONLY)
Abs Immature Granulocytes: 0.01 10*3/uL (ref 0.00–0.07)
Basophils Absolute: 0.1 10*3/uL (ref 0.0–0.1)
Basophils Relative: 1 %
Eosinophils Absolute: 0.4 10*3/uL (ref 0.0–0.5)
Eosinophils Relative: 8 %
HCT: 36.5 % (ref 36.0–46.0)
Hemoglobin: 11.9 g/dL — ABNORMAL LOW (ref 12.0–15.0)
Immature Granulocytes: 0 %
Lymphocytes Relative: 16 %
Lymphs Abs: 0.8 10*3/uL (ref 0.7–4.0)
MCH: 31.6 pg (ref 26.0–34.0)
MCHC: 32.6 g/dL (ref 30.0–36.0)
MCV: 97.1 fL (ref 80.0–100.0)
Monocytes Absolute: 0.4 10*3/uL (ref 0.1–1.0)
Monocytes Relative: 9 %
Neutro Abs: 3 10*3/uL (ref 1.7–7.7)
Neutrophils Relative %: 66 %
Platelet Count: 156 10*3/uL (ref 150–400)
RBC: 3.76 MIL/uL — ABNORMAL LOW (ref 3.87–5.11)
RDW: 13.1 % (ref 11.5–15.5)
WBC Count: 4.6 10*3/uL (ref 4.0–10.5)
nRBC: 0 % (ref 0.0–0.2)

## 2020-08-19 MED ORDER — FULVESTRANT 250 MG/5ML IM SOLN
INTRAMUSCULAR | Status: AC
  Filled 2020-08-19: qty 10

## 2020-08-19 MED ORDER — FULVESTRANT 250 MG/5ML IM SOLN
500.0000 mg | INTRAMUSCULAR | Status: DC
  Administered 2020-08-19: 500 mg via INTRAMUSCULAR

## 2020-08-19 NOTE — Patient Instructions (Signed)
Fulvestrant injection What is this medicine? FULVESTRANT (ful VES trant) blocks the effects of estrogen. It is used to treat breast cancer. This medicine may be used for other purposes; ask your health care provider or pharmacist if you have questions. COMMON BRAND NAME(S): FASLODEX What should I tell my health care provider before I take this medicine? They need to know if you have any of these conditions:  bleeding disorders  liver disease  low blood counts, like low white cell, platelet, or red cell counts  an unusual or allergic reaction to fulvestrant, other medicines, foods, dyes, or preservatives  pregnant or trying to get pregnant  breast-feeding How should I use this medicine? This medicine is for injection into a muscle. It is usually given by a health care professional in a hospital or clinic setting. Talk to your pediatrician regarding the use of this medicine in children. Special care may be needed. Overdosage: If you think you have taken too much of this medicine contact a poison control center or emergency room at once. NOTE: This medicine is only for you. Do not share this medicine with others. What if I miss a dose? It is important not to miss your dose. Call your doctor or health care professional if you are unable to keep an appointment. What may interact with this medicine?  medicines that treat or prevent blood clots like warfarin, enoxaparin, dalteparin, apixaban, dabigatran, and rivaroxaban This list may not describe all possible interactions. Give your health care provider a list of all the medicines, herbs, non-prescription drugs, or dietary supplements you use. Also tell them if you smoke, drink alcohol, or use illegal drugs. Some items may interact with your medicine. What should I watch for while using this medicine? Your condition will be monitored carefully while you are receiving this medicine. You will need important blood work done while you are taking  this medicine. Do not become pregnant while taking this medicine or for at least 1 year after stopping it. Women of child-bearing potential will need to have a negative pregnancy test before starting this medicine. Women should inform their doctor if they wish to become pregnant or think they might be pregnant. There is a potential for serious side effects to an unborn child. Men should inform their doctors if they wish to father a child. This medicine may lower sperm counts. Talk to your health care professional or pharmacist for more information. Do not breast-feed an infant while taking this medicine or for 1 year after the last dose. What side effects may I notice from receiving this medicine? Side effects that you should report to your doctor or health care professional as soon as possible:  allergic reactions like skin rash, itching or hives, swelling of the face, lips, or tongue  feeling faint or lightheaded, falls  pain, tingling, numbness, or weakness in the legs  signs and symptoms of infection like fever or chills; cough; flu-like symptoms; sore throat  vaginal bleeding Side effects that usually do not require medical attention (report to your doctor or health care professional if they continue or are bothersome):  aches, pains  constipation  diarrhea  headache  hot flashes  nausea, vomiting  pain at site where injected  stomach pain This list may not describe all possible side effects. Call your doctor for medical advice about side effects. You may report side effects to FDA at 1-800-FDA-1088. Where should I keep my medicine? This drug is given in a hospital or clinic and will   not be stored at home. NOTE: This sheet is a summary. It may not cover all possible information. If you have questions about this medicine, talk to your doctor, pharmacist, or health care provider.  2020 Elsevier/Gold Standard (2018-01-24 11:34:41)  

## 2020-08-19 NOTE — Telephone Encounter (Signed)
Scheduled per los. Gave avs and calendar  

## 2020-08-19 NOTE — Progress Notes (Signed)
REFERRING PROVIDER: Truitt Merle, MD [place],  [place] [address]  PRIMARY PROVIDER:  Lajean Manes, MD  PRIMARY REASON FOR VISIT:  1. Malignant neoplasm of central portion of left breast in female, estrogen receptor positive ([place])   2. Family history of pancreatic cancer   3. Family history of prostate cancer   4. Family history of ovarian cancer   5. Family history of breast cancer   6. Family history of colon cancer      HISTORY OF PRESENT ILLNESS:   Gloria [name], a 86 y.o. female, was seen for a [facility] cancer genetics consultation due to a personal and family history of cancer and discussion of updated genetic testing.  Gloria [name] presents to clinic today to discuss the possibility of a hereditary predisposition to cancer, genetic testing, and to further clarify her future cancer risks, as well as potential cancer risks for family members.   In October of 2011, at the age of 49, Gloria [name] was diagnosed with invasive lobular carcinoma, ER+/PR+/Her2-, of the left breast. The treatment plan included left mastectomy, antiestrogen therapy, and radiation therapy. She had a recurrence of the left chest wall in 2016, for which she underwent surgery, radiation therapy, and antiestrogen therapy. She is currently treated with monthly faslodex injections.   Gloria [name] had negative genetic testing of the BRCA1 and BRCA2 genes in January of 2012 through [facility].  CANCER HISTORY:  Oncology History Overview Note  Cancer of central portion of left female breast [facility])   Staging form: Breast, AJCC 7th Edition     Pathologic stage from 08/04/2010: Stage IIB (T2, N1a, cM0) - Signed by Truitt Merle, MD on 12/09/2015     Cancer of central portion of left female breast ([place])  08/03/2010 Mammogram    left nipple retraction, and the palpable mass subareolar left breast at the 6:00 position. Ultrasound confirmed the presence of a mass measuring 2.6 x 1.7 x  2.4 cm   08/04/2010 Initial Biopsy   left breast mass biopsy showed an invasive mammary carcinoma with lobular features. ER 95%, PR 97%, Ki-67 17%, HER-2/neu (-)   08/04/2010 Pathologic Stage   Stage IIB: T2 N1a   08/11/2010 Imaging   left breast mass 4.6 x 4.2 x 2.3 cm with enhancement distortion extending to the nipple retraction. There is questionable anterior mediastinal lymph node seen   07/2010 - 09/03/2010 Anti-estrogen oral therapy   neoadjuvant letrozole    09/03/2010 Surgery    left breast mastectomy with sentinel node biopsy on 09/03/2010.   08/2010 - 01/2015 Anti-estrogen oral therapy   Tamoxifen, pt stopped on her own    12/03/2010 - 01/23/2011 Radiation Therapy   left chest wall and axilla radiaiton    10/19/2015 Progression   Left chest wall recurrence, s/p resection on 11/25/2015 with positive margins ([facility])    12/10/2015 - 05/31/2017 Anti-estrogen oral therapy   anastrozole 71m daily, which was switched to Tamoxifen on 06/30/2016 due to persistent disease.   01/03/2016 - 03/03/2016 Radiation Therapy   Radiation to left chest wall recurrence (12/06/2015-02/04/2016 - 44 Gy), pt progressed through the radiation, and had additional 10 fractions of radiation (02/21/16-03/03/2016 - total 70 Gy to the left chest wall)   07/14/2016 PET scan   PET 07/14/2016 IMPRESSION: 1. No findings of recurrent malignancy. 2. Radiation fibrosis in the lingula. A small left axillary lymph node is not hypermetabolic and only 6 mm in diameter. This is in the vicinity of the prior  axillary cyst. 3. Pelvic floor laxity.   07/17/2016 Surgery   Chest wall soft tissue resection for local recurrence   07/17/2016 Pathology Results   Left chest wall two soft tissue resection both showed invasive lobular carcinoma, margins were positive. ER 95% positive, PR 60% positive, HER-2 negative.   07/27/2016 Imaging   CT chest, abdomen and pelvis wo contrast  07/27/2016 IMPRESSION: Sigmoid diverticulosis without acute  diverticulitis. No bowel obstruction or acute inflammation. Degenerative disc disease and facet arthropathy of the lower lumbar spine with slight grade 1 anterolisthesis of L4 on L5. Stable appearing simple left-sided renal cysts.   03/01/2017 Mammogram   Mammogram 03/01/2017 IMPRESSION: No mammographic evidence of malignancy. A result letter of this screening mammogram will be mailed directly to the patient. RECOMMENDATION: Screening mammogram in one year   04/10/2017 Imaging   PET IMPRESSION: 1. Stable exam.  No findings of recurrent malignancy. 2. Changes of external beam radiation again noted within the lingula. 3. Small left axillary lymph node exhibits mild increased uptake. Unchanged from previous exam.   06/07/2017 -  Anti-estrogen oral therapy   Stopped Tamoxifen and Began monthly Faslodex IM injection on 06/07/17  -Held after 09/26/19 due to lost f/u. Restart with loading dose on 04/29/20.    09/19/2017 Imaging   CT CAP WO Contrast 09/19/17 IMPRESSION: 1. Surgical changes from a left mastectomy without CT findings to suggest recurrent chest wall tumor or axillary lymphadenopathy. 2. No findings to suggest metastatic disease involving the neck, chest, abdomen or pelvis or osseous structures. 3. Stable radiation changes involving the lingula.    09/19/2017 Imaging   Bone Scan Whole Body  IMPRESSION: No definite scintigraphic evidence of osseous metastatic disease. Scattered degenerative type uptake as above.   03/11/2018 PET scan   IMPRESSION:  1. No evidence of recurrent disease. 2.  Aortic atherosclerosis    09/03/2018 PET scan   09/03/2018 PET Scan IMPRESSION: 1. No findings to suggest locally recurrent disease or metastatic disease in the neck, chest, abdomen or pelvis. 2. Hepatic steatosis. 3. Colonic diverticulosis without evidence of acute diverticulitis at this time. 4. Aortic atherosclerosis. 5. Additional incidental findings, as above.   11/25/2018 Imaging     DG Ribs Unilateral W/Chest Left  IMPRESSION: Stable left lingular scarring. Normal left ribs. No acute cardiopulmonary abnormality seen.    04/04/2019 PET scan   PET  IMPRESSION: 1. No evidence of hypermetabolic recurrent or metastatic disease. 2. Hepatic steatosis. 3.  Aortic Atherosclerosis (ICD10-I70.0).   05/18/2020 PET scan   IMPRESSION: 1. Findings of LEFT mastectomy and axillary dissection with radiation changes, no signs of metastatic disease or disease recurrence at this time. 2. Hepatic steatosis. 3. Aortic atherosclerosis.   Aortic Atherosclerosis (ICD10-I70.0).        Past Medical History:  Diagnosis Date  . Anxiety   . Blood in urine   . CAD (coronary artery disease)    non obstructive by cath  . Cancer [facility])    breast- left  . Depression   . Family history of breast cancer   . Family history of colon cancer   . Family history of ovarian cancer   . Family history of pancreatic cancer   . Family history of prostate cancer   . GERD (gastroesophageal reflux disease)   . Hypertension   . Knee fracture   . Personal history of radiation therapy 2017  . TR (tricuspid regurgitation)    mild by Echo 12/2008 EF >55%    Past Surgical History:  Procedure  Laterality Date  . ABDOMINAL AORTAGRAM N/A 09/08/2011   Procedure: ABDOMINAL Maxcine [name];  Surgeon: Troy Sine, MD;  Location: [facility] CATH LAB;  Service: Cardiovascular;  Laterality: N/A;  . ABDOMINAL HYSTERECTOMY    . BREAST EXCISIONAL BIOPSY Left 2017   X3  . BREAST SURGERY  08-30-10   mastectomy  . CARDIAC CATHETERIZATION  08/2011   20% LAD stenosis, 20% diagonal stenosis, 10-20% proximal dominant RCA stenosis  . COLON SURGERY    . LEFT HEART CATHETERIZATION WITH CORONARY ANGIOGRAM N/A 09/08/2011   Procedure: LEFT HEART CATHETERIZATION WITH CORONARY ANGIOGRAM;  Surgeon: Troy Sine, MD;  Location: [facility] CATH LAB;  Service: Cardiovascular;  Laterality: N/A;  . MASTECTOMY    . MINOR BREAST BIOPSY Left  11/25/2015   Procedure: MINOR EXCISION MASS LEFT CHEST WALL;  Surgeon: Autumn Messing III, MD;  Location: [facility];  Service: General;  Laterality: Left;  . MINOR BREAST BIOPSY Left 07/17/2016   Procedure: EXCISION OF 2 LEFT CHEST WALL MASSES;  Surgeon: Autumn Messing III, MD;  Location: [facility];  Service: General;  Laterality: Left;  . TONSILLECTOMY      Social History   Socioeconomic History  . Marital status: Divorced    Spouse name: Not on file  . Number of children: Not on file  . Years of education: Not on file  . Highest education level: Not on file  Occupational History  . Not on file  Tobacco Use  . Smoking status: Former Smoker    Quit date: 11/15/1991    Years since quitting: 28.7  . Smokeless tobacco: Never Used  Substance and Sexual Activity  . Alcohol use: Yes    Alcohol/week: 2.0 standard drinks    Types: 2 Glasses of wine per week    Comment: 1 glass per week  . Drug use: No  . Sexual activity: Not Currently  Other Topics Concern  . Not on file  Social History Narrative  . Not on file   Social Determinants of Health   Financial Resource Strain:   . Difficulty of Paying Living Expenses: Not on file  Food Insecurity:   . Worried About Charity fundraiser in the Last Year: Not on file  . Ran Out of Food in the Last Year: Not on file  Transportation Needs:   . Lack of Transportation (Medical): Not on file  . Lack of Transportation (Non-Medical): Not on file  Physical Activity:   . Days of Exercise per Week: Not on file  . Minutes of Exercise per Session: Not on file  Stress:   . Feeling of Stress : Not on file  Social Connections:   . Frequency of Communication with Friends and Family: Not on file  . Frequency of Social Gatherings with Friends and Family: Not on file  . Attends Religious Services: Not on file  . Active Member of Clubs or Organizations: Not on file  . Attends Archivist Meetings: Not on file  . Marital  Status: Not on file     FAMILY HISTORY:  We obtained a detailed, 4-generation family history.  Significant diagnoses are listed below: Family History  Problem Relation Age of Onset  . Prostate cancer Father 13       metastatic  . Heart disease Sister   . Breast cancer Sister 63  . Pancreatic cancer Brother        dx in his 10s  . Colon cancer Maternal Grandmother  dx <50  . Dementia Mother   . Sudden death Brother   . Heart attack Brother   . Heart attack Brother   . Arthritis Sister   . Cancer Paternal Uncle        unknown type  . Ovarian cancer Niece 39   Gloria [name] has one daughter (age 30) and one son (age 64). Her daughter has had negative genetic testing (results not available for review today). She also notes that her daughter's daughter has schizophrenia, which has been difficult to treat. Gloria [name] had four brothers and three sisters. One brohter died from pancreatic cancer in his 60s. One sister may have had breast cancer around the age of 48 or 30. This sister has a daughter who was recently diagnosed with ovarian cancer and is undergoing genetic testing.  Gloria [name] mother died at the age of 55 and did not have cancer. Gloria [name] had one maternal uncle who died at the age of 17 without cancer. Her maternal grandmother died younger than 9 from colon cancer, and her maternal grandfather died in his 25s without cancer.  Gloria [name] father died at the age of 26 from metastatic prostate cancer that was first diagnosed when he was 41. She had six paternal aunts/uncles. One uncle had cancer, although she does not know the type or how old he was when it was diagnosed. Her paternal grandmother died at the age of 84, and her paternal grandfather died older than 93.   Ms. Apt is aware of previous family history of genetic testing for hereditary cancer risks in her daughter and niece. Patient's maternal ancestors are of [place] descent, and paternal ancestors are of [place]  descent. There is no reported Ashkenazi Jewish ancestry. There is no known consanguinity.  GENETIC COUNSELING ASSESSMENT: Gloria [name] is a 86 y.o. female with a personal history of breast cancer and a family history of metastatic prostate cancer, pancreatic cancer, breast cancer, and ovarian cancer, which is somewhat suggestive of a hereditary cancer syndrome and predisposition to cancer. We, therefore, discussed and recommended the following at today's visit.   DISCUSSION: We discussed that approximately 5-10% of breast cancer is hereditary, with most cases associated with the BRCA1 and BRCA2 genes. There are other genes that can be associated with hereditary breast cancer syndromes. These include ATM, CHEK2, PALB2, etc. We discussed that testing is beneficial for several reasons, including knowing about other cancer risks, identifying potential screening and risk-reduction options that may be appropriate, and to understand if other family members could be at risk for cancer and allow them to undergo genetic testing.  Gloria [name] had normal genetic testing for the BRCA1/2 genes in 2012 through the [place] test (result not available for review today). Because there are other genes known to increase breast cancer risk that she has not had testing for, and because mutations in these genes may impact medical management, it is appropriate for Gloria [name] to pursue updated genetic testing for other hereditary breast cancer genes.  We reviewed the characteristics, features and inheritance patterns of hereditary cancer syndromes. We also discussed genetic testing, including the appropriate family members to test, the process of testing, insurance coverage and turn-around-time for results. We discussed the implications of a negative, positive and/or variant of uncertain significant result. We recommended Gloria [name] pursue genetic testing for a hereditary cancer gene panel that includes known  breast cancer genes.   Based on Ms. Lanuza's personal and family history of cancer, she meets medical  criteria for genetic testing. Despite that she meets criteria, there may still be an out of pocket cost.   PLAN:  Despite our recommendation, Gloria [name] did not wish to pursue genetic testing at today's visit. We understand this decision and remain available to coordinate genetic testing at any time in the future. We, therefore, recommend Gloria [name] continue to follow the cancer screening guidelines given by her primary healthcare provider.  Lastly, we encouraged Gloria [name] to remain in contact with cancer genetics annually so that we can continuously update the family history and inform her of any changes in cancer genetics and testing that may be of benefit for this family.   Gloria [name] questions were answered to her satisfaction today. Our contact information was provided should additional questions or concerns arise. Thank you for the referral and allowing [place] to share in the care of your patient.   Clint Guy, [place], [facility] Licensed, Certified Dispensing [email or web address] Phone: [phone]  The patient was seen for a total of 40 minutes in face-to-face genetic counseling.  This patient was discussed with Drs. Magrinat, Lindi Adie and/or Burr Medico who agrees with the above.    _______________________________________________________________________ For Office Staff:  Number of people involved in session: 1 Was an Intern/ student involved with case: no

## 2020-08-20 LAB — CANCER ANTIGEN 27.29: CA 27.29: 45.3 U/mL — ABNORMAL HIGH (ref 0.0–38.6)

## 2020-08-21 LAB — URINE CULTURE: Culture: <10000 — AB

## 2020-08-24 ENCOUNTER — Telehealth: Payer: Self-pay | Admitting: *Deleted

## 2020-08-24 NOTE — Telephone Encounter (Signed)
-----   Message from Truitt Merle, MD sent at 08/24/2020  9:43 AM EDT ----- Please let pt know her urine culture last week was negative, thanks   Truitt Merle

## 2020-08-24 NOTE — Telephone Encounter (Signed)
TCT to patient's daughter and spoke with her and informed her that the urine culture is negative from last weeks's urine specimen. She voiced understanding.

## 2020-09-06 ENCOUNTER — Ambulatory Visit
Admission: RE | Admit: 2020-09-06 | Discharge: 2020-09-06 | Disposition: A | Discharge status: Home / Self Care | Payer: PPO | Source: Ambulatory Visit | Attending: Geriatric Medicine | Admitting: Geriatric Medicine

## 2020-09-06 ENCOUNTER — Other Ambulatory Visit: Payer: Self-pay | Admitting: Geriatric Medicine

## 2020-09-06 DIAGNOSIS — R21 Rash and other nonspecific skin eruption: Secondary | ICD-10-CM | POA: Diagnosis not present

## 2020-09-06 DIAGNOSIS — Z23 Encounter for immunization: Secondary | ICD-10-CM | POA: Diagnosis not present

## 2020-09-06 DIAGNOSIS — I10 Essential (primary) hypertension: Secondary | ICD-10-CM | POA: Diagnosis not present

## 2020-09-06 DIAGNOSIS — J301 Allergic rhinitis due to pollen: Secondary | ICD-10-CM | POA: Diagnosis not present

## 2020-09-06 DIAGNOSIS — J42 Unspecified chronic bronchitis: Secondary | ICD-10-CM

## 2020-09-06 DIAGNOSIS — L299 Pruritus, unspecified: Secondary | ICD-10-CM | POA: Diagnosis not present

## 2020-09-06 DIAGNOSIS — J449 Chronic obstructive pulmonary disease, unspecified: Secondary | ICD-10-CM | POA: Diagnosis not present

## 2020-09-06 DIAGNOSIS — H3530 Unspecified macular degeneration: Secondary | ICD-10-CM | POA: Diagnosis not present

## 2020-09-08 DIAGNOSIS — R197 Diarrhea, unspecified: Secondary | ICD-10-CM | POA: Diagnosis not present

## 2020-09-16 ENCOUNTER — Other Ambulatory Visit: Payer: Self-pay

## 2020-09-16 ENCOUNTER — Inpatient Hospital Stay: Payer: PPO | Attending: Hematology

## 2020-09-16 VITALS — BP 123/79 | HR 79 | Temp 98.2°F | Resp 16

## 2020-09-16 DIAGNOSIS — Z79818 Long term (current) use of other agents affecting estrogen receptors and estrogen levels: Secondary | ICD-10-CM | POA: Insufficient documentation

## 2020-09-16 DIAGNOSIS — C50112 Malignant neoplasm of central portion of left female breast: Secondary | ICD-10-CM | POA: Diagnosis not present

## 2020-09-16 DIAGNOSIS — Z170 Estrogen receptor positive status [ER+]: Secondary | ICD-10-CM | POA: Diagnosis not present

## 2020-09-16 MED ORDER — FULVESTRANT 250 MG/5ML IM SOLN
INTRAMUSCULAR | Status: AC
  Filled 2020-09-16: qty 10

## 2020-09-16 MED ORDER — FULVESTRANT 250 MG/5ML IM SOLN
500.0000 mg | INTRAMUSCULAR | Status: DC
  Administered 2020-09-16: 500 mg via INTRAMUSCULAR

## 2020-09-16 NOTE — Patient Instructions (Signed)
Fulvestrant injection What is this medicine? FULVESTRANT (ful VES trant) blocks the effects of estrogen. It is used to treat breast cancer. This medicine may be used for other purposes; ask your health care provider or pharmacist if you have questions. COMMON BRAND NAME(S): FASLODEX What should I tell my health care provider before I take this medicine? They need to know if you have any of these conditions:  bleeding disorders  liver disease  low blood counts, like low white cell, platelet, or red cell counts  an unusual or allergic reaction to fulvestrant, other medicines, foods, dyes, or preservatives  pregnant or trying to get pregnant  breast-feeding How should I use this medicine? This medicine is for injection into a muscle. It is usually given by a health care professional in a hospital or clinic setting. Talk to your pediatrician regarding the use of this medicine in children. Special care may be needed. Overdosage: If you think you have taken too much of this medicine contact a poison control center or emergency room at once. NOTE: This medicine is only for you. Do not share this medicine with others. What if I miss a dose? It is important not to miss your dose. Call your doctor or health care professional if you are unable to keep an appointment. What may interact with this medicine?  medicines that treat or prevent blood clots like warfarin, enoxaparin, dalteparin, apixaban, dabigatran, and rivaroxaban This list may not describe all possible interactions. Give your health care provider a list of all the medicines, herbs, non-prescription drugs, or dietary supplements you use. Also tell them if you smoke, drink alcohol, or use illegal drugs. Some items may interact with your medicine. What should I watch for while using this medicine? Your condition will be monitored carefully while you are receiving this medicine. You will need important blood work done while you are taking  this medicine. Do not become pregnant while taking this medicine or for at least 1 year after stopping it. Women of child-bearing potential will need to have a negative pregnancy test before starting this medicine. Women should inform their doctor if they wish to become pregnant or think they might be pregnant. There is a potential for serious side effects to an unborn child. Men should inform their doctors if they wish to father a child. This medicine may lower sperm counts. Talk to your health care professional or pharmacist for more information. Do not breast-feed an infant while taking this medicine or for 1 year after the last dose. What side effects may I notice from receiving this medicine? Side effects that you should report to your doctor or health care professional as soon as possible:  allergic reactions like skin rash, itching or hives, swelling of the face, lips, or tongue  feeling faint or lightheaded, falls  pain, tingling, numbness, or weakness in the legs  signs and symptoms of infection like fever or chills; cough; flu-like symptoms; sore throat  vaginal bleeding Side effects that usually do not require medical attention (report to your doctor or health care professional if they continue or are bothersome):  aches, pains  constipation  diarrhea  headache  hot flashes  nausea, vomiting  pain at site where injected  stomach pain This list may not describe all possible side effects. Call your doctor for medical advice about side effects. You may report side effects to FDA at 1-800-FDA-1088. Where should I keep my medicine? This drug is given in a hospital or clinic and will   not be stored at home. NOTE: This sheet is a summary. It may not cover all possible information. If you have questions about this medicine, talk to your doctor, pharmacist, or health care provider.  2020 Elsevier/Gold Standard (2018-01-24 11:34:41)  

## 2020-10-04 DIAGNOSIS — R059 Cough, unspecified: Secondary | ICD-10-CM | POA: Diagnosis not present

## 2020-10-04 DIAGNOSIS — Z20822 Contact with and (suspected) exposure to covid-19: Secondary | ICD-10-CM | POA: Diagnosis not present

## 2020-10-06 NOTE — Progress Notes (Signed)
..  The following Medication: Faslodex has been approved for re-enrollment thru [employer]. No interruptions of services, original program expires 10/29/2020. Approval extended to 10/30/2020 - 10/29/2021. Reason for Assist: EOOP.  Next DOS: 10/14/2020

## 2020-10-07 ENCOUNTER — Other Ambulatory Visit: Payer: Self-pay | Admitting: Adult Health

## 2020-10-11 ENCOUNTER — Other Ambulatory Visit: Payer: Self-pay | Admitting: Unknown Physician Specialty

## 2020-10-11 ENCOUNTER — Ambulatory Visit
Admission: RE | Admit: 2020-10-11 | Discharge: 2020-10-11 | Disposition: A | Discharge status: Home / Self Care | Payer: PPO | Attending: Unknown Physician Specialty | Admitting: Unknown Physician Specialty

## 2020-10-11 ENCOUNTER — Ambulatory Visit
Admission: RE | Admit: 2020-10-11 | Discharge: 2020-10-11 | Disposition: A | Discharge status: Home / Self Care | Payer: PPO | Source: Ambulatory Visit | Attending: Unknown Physician Specialty | Admitting: Unknown Physician Specialty

## 2020-10-11 ENCOUNTER — Other Ambulatory Visit: Payer: Self-pay

## 2020-10-11 DIAGNOSIS — R053 Chronic cough: Secondary | ICD-10-CM | POA: Diagnosis not present

## 2020-10-11 DIAGNOSIS — J209 Acute bronchitis, unspecified: Secondary | ICD-10-CM | POA: Diagnosis not present

## 2020-10-11 DIAGNOSIS — R059 Cough, unspecified: Secondary | ICD-10-CM | POA: Diagnosis not present

## 2020-10-11 DIAGNOSIS — H6123 Impacted cerumen, bilateral: Secondary | ICD-10-CM | POA: Diagnosis not present

## 2020-10-12 ENCOUNTER — Telehealth: Payer: Self-pay | Admitting: Hematology

## 2020-10-12 NOTE — Telephone Encounter (Signed)
Pt called in to reschedule appt from 12/16 to 12/23, confirmed new appt with pt

## 2020-10-14 ENCOUNTER — Inpatient Hospital Stay: Payer: PPO

## 2020-10-21 ENCOUNTER — Inpatient Hospital Stay: Payer: PPO | Attending: Hematology

## 2020-10-21 ENCOUNTER — Other Ambulatory Visit: Payer: Self-pay

## 2020-10-21 VITALS — BP 154/94 | HR 81 | Temp 98.4°F | Resp 18

## 2020-10-21 DIAGNOSIS — Z79818 Long term (current) use of other agents affecting estrogen receptors and estrogen levels: Secondary | ICD-10-CM | POA: Diagnosis not present

## 2020-10-21 DIAGNOSIS — C50112 Malignant neoplasm of central portion of left female breast: Secondary | ICD-10-CM | POA: Insufficient documentation

## 2020-10-21 DIAGNOSIS — Z170 Estrogen receptor positive status [ER+]: Secondary | ICD-10-CM | POA: Insufficient documentation

## 2020-10-21 MED ORDER — FULVESTRANT 250 MG/5ML IM SOLN
500.0000 mg | INTRAMUSCULAR | Status: DC
  Administered 2020-10-21: 500 mg via INTRAMUSCULAR

## 2020-10-21 NOTE — Patient Instructions (Signed)
Fulvestrant injection What is this medicine? FULVESTRANT (ful VES trant) blocks the effects of estrogen. It is used to treat breast cancer. This medicine may be used for other purposes; ask your health care provider or pharmacist if you have questions. COMMON BRAND NAME(S): FASLODEX What should I tell my health care provider before I take this medicine? They need to know if you have any of these conditions:  bleeding disorders  liver disease  low blood counts, like low white cell, platelet, or red cell counts  an unusual or allergic reaction to fulvestrant, other medicines, foods, dyes, or preservatives  pregnant or trying to get pregnant  breast-feeding How should I use this medicine? This medicine is for injection into a muscle. It is usually given by a health care professional in a hospital or clinic setting. Talk to your pediatrician regarding the use of this medicine in children. Special care may be needed. Overdosage: If you think you have taken too much of this medicine contact a poison control center or emergency room at once. NOTE: This medicine is only for you. Do not share this medicine with others. What if I miss a dose? It is important not to miss your dose. Call your doctor or health care professional if you are unable to keep an appointment. What may interact with this medicine?  medicines that treat or prevent blood clots like warfarin, enoxaparin, dalteparin, apixaban, dabigatran, and rivaroxaban This list may not describe all possible interactions. Give your health care provider a list of all the medicines, herbs, non-prescription drugs, or dietary supplements you use. Also tell them if you smoke, drink alcohol, or use illegal drugs. Some items may interact with your medicine. What should I watch for while using this medicine? Your condition will be monitored carefully while you are receiving this medicine. You will need important blood work done while you are taking  this medicine. Do not become pregnant while taking this medicine or for at least 1 year after stopping it. Women of child-bearing potential will need to have a negative pregnancy test before starting this medicine. Women should inform their doctor if they wish to become pregnant or think they might be pregnant. There is a potential for serious side effects to an unborn child. Men should inform their doctors if they wish to father a child. This medicine may lower sperm counts. Talk to your health care professional or pharmacist for more information. Do not breast-feed an infant while taking this medicine or for 1 year after the last dose. What side effects may I notice from receiving this medicine? Side effects that you should report to your doctor or health care professional as soon as possible:  allergic reactions like skin rash, itching or hives, swelling of the face, lips, or tongue  feeling faint or lightheaded, falls  pain, tingling, numbness, or weakness in the legs  signs and symptoms of infection like fever or chills; cough; flu-like symptoms; sore throat  vaginal bleeding Side effects that usually do not require medical attention (report to your doctor or health care professional if they continue or are bothersome):  aches, pains  constipation  diarrhea  headache  hot flashes  nausea, vomiting  pain at site where injected  stomach pain This list may not describe all possible side effects. Call your doctor for medical advice about side effects. You may report side effects to FDA at 1-800-FDA-1088. Where should I keep my medicine? This drug is given in a hospital or clinic and will   not be stored at home. NOTE: This sheet is a summary. It may not cover all possible information. If you have questions about this medicine, talk to your doctor, pharmacist, or health care provider.  2020 Elsevier/Gold Standard (2018-01-24 11:34:41)  

## 2020-11-09 ENCOUNTER — Ambulatory Visit (HOSPITAL_COMMUNITY)
Admission: RE | Admit: 2020-11-09 | Discharge: 2020-11-09 | Disposition: A | Discharge status: Home / Self Care | Payer: PPO | Source: Ambulatory Visit | Attending: Hematology | Admitting: Hematology

## 2020-11-09 ENCOUNTER — Other Ambulatory Visit: Payer: Self-pay

## 2020-11-09 ENCOUNTER — Inpatient Hospital Stay: Payer: PPO | Attending: Hematology

## 2020-11-09 DIAGNOSIS — Z79818 Long term (current) use of other agents affecting estrogen receptors and estrogen levels: Secondary | ICD-10-CM | POA: Insufficient documentation

## 2020-11-09 DIAGNOSIS — K760 Fatty (change of) liver, not elsewhere classified: Secondary | ICD-10-CM | POA: Insufficient documentation

## 2020-11-09 DIAGNOSIS — Z79899 Other long term (current) drug therapy: Secondary | ICD-10-CM | POA: Insufficient documentation

## 2020-11-09 DIAGNOSIS — I700 Atherosclerosis of aorta: Secondary | ICD-10-CM | POA: Insufficient documentation

## 2020-11-09 DIAGNOSIS — I2510 Atherosclerotic heart disease of native coronary artery without angina pectoris: Secondary | ICD-10-CM | POA: Diagnosis not present

## 2020-11-09 DIAGNOSIS — M810 Age-related osteoporosis without current pathological fracture: Secondary | ICD-10-CM | POA: Insufficient documentation

## 2020-11-09 DIAGNOSIS — K5730 Diverticulosis of large intestine without perforation or abscess without bleeding: Secondary | ICD-10-CM | POA: Diagnosis not present

## 2020-11-09 DIAGNOSIS — F418 Other specified anxiety disorders: Secondary | ICD-10-CM | POA: Insufficient documentation

## 2020-11-09 DIAGNOSIS — C773 Secondary and unspecified malignant neoplasm of axilla and upper limb lymph nodes: Secondary | ICD-10-CM | POA: Insufficient documentation

## 2020-11-09 DIAGNOSIS — C50112 Malignant neoplasm of central portion of left female breast: Secondary | ICD-10-CM | POA: Insufficient documentation

## 2020-11-09 DIAGNOSIS — Z7982 Long term (current) use of aspirin: Secondary | ICD-10-CM | POA: Diagnosis not present

## 2020-11-09 DIAGNOSIS — Z79811 Long term (current) use of aromatase inhibitors: Secondary | ICD-10-CM | POA: Insufficient documentation

## 2020-11-09 DIAGNOSIS — Z170 Estrogen receptor positive status [ER+]: Secondary | ICD-10-CM | POA: Insufficient documentation

## 2020-11-09 DIAGNOSIS — R079 Chest pain, unspecified: Secondary | ICD-10-CM | POA: Insufficient documentation

## 2020-11-09 DIAGNOSIS — Z923 Personal history of irradiation: Secondary | ICD-10-CM | POA: Diagnosis not present

## 2020-11-09 DIAGNOSIS — I10 Essential (primary) hypertension: Secondary | ICD-10-CM | POA: Diagnosis not present

## 2020-11-09 DIAGNOSIS — K219 Gastro-esophageal reflux disease without esophagitis: Secondary | ICD-10-CM | POA: Insufficient documentation

## 2020-11-09 DIAGNOSIS — Z9012 Acquired absence of left breast and nipple: Secondary | ICD-10-CM | POA: Diagnosis not present

## 2020-11-09 DIAGNOSIS — C50919 Malignant neoplasm of unspecified site of unspecified female breast: Secondary | ICD-10-CM | POA: Diagnosis not present

## 2020-11-09 LAB — CMP (CANCER CENTER ONLY)
ALT: 19 U/L (ref 0–44)
AST: 24 U/L (ref 15–41)
Albumin: 3.7 g/dL (ref 3.5–5.0)
Alkaline Phosphatase: 84 U/L (ref 38–126)
Anion gap: 7 (ref 5–15)
BUN: 14 mg/dL (ref 8–23)
CO2: 28 mmol/L (ref 22–32)
Calcium: 9.6 mg/dL (ref 8.9–10.3)
Chloride: 107 mmol/L (ref 98–111)
Creatinine: 1.01 mg/dL — ABNORMAL HIGH (ref 0.44–1.00)
GFR, Estimated: 53 mL/min — ABNORMAL LOW (ref >60)
Glucose, Bld: 99 mg/dL (ref 70–99)
Potassium: 3.9 mmol/L (ref 3.5–5.1)
Sodium: 142 mmol/L (ref 135–145)
Total Bilirubin: 0.8 mg/dL (ref 0.3–1.2)
Total Protein: 6.4 g/dL — ABNORMAL LOW (ref 6.5–8.1)

## 2020-11-09 LAB — CBC WITH DIFFERENTIAL (CANCER CENTER ONLY)
Abs Immature Granulocytes: 0.01 10*3/uL (ref 0.00–0.07)
Basophils Absolute: 0.1 10*3/uL (ref 0.0–0.1)
Basophils Relative: 2 %
Eosinophils Absolute: 0.4 10*3/uL (ref 0.0–0.5)
Eosinophils Relative: 11 %
HCT: 41.2 % (ref 36.0–46.0)
Hemoglobin: 13.5 g/dL (ref 12.0–15.0)
Immature Granulocytes: 0 %
Lymphocytes Relative: 20 %
Lymphs Abs: 0.7 10*3/uL (ref 0.7–4.0)
MCH: 31.8 pg (ref 26.0–34.0)
MCHC: 32.8 g/dL (ref 30.0–36.0)
MCV: 96.9 fL (ref 80.0–100.0)
Monocytes Absolute: 0.4 10*3/uL (ref 0.1–1.0)
Monocytes Relative: 12 %
Neutro Abs: 1.8 10*3/uL (ref 1.7–7.7)
Neutrophils Relative %: 55 %
Platelet Count: 168 10*3/uL (ref 150–400)
RBC: 4.25 MIL/uL (ref 3.87–5.11)
RDW: 13 % (ref 11.5–15.5)
WBC Count: 3.3 10*3/uL — ABNORMAL LOW (ref 4.0–10.5)
nRBC: 0 % (ref 0.0–0.2)

## 2020-11-09 LAB — GLUCOSE, CAPILLARY: Glucose-Capillary: 83 mg/dL (ref 70–99)

## 2020-11-09 MED ORDER — FLUDEOXYGLUCOSE F - 18 (FDG) INJECTION
6.7000 | Freq: Once | INTRAVENOUS | Status: AC
  Administered 2020-11-09: 6.7 via INTRAVENOUS

## 2020-11-10 LAB — CANCER ANTIGEN 27.29: CA 27.29: 76.2 U/mL — ABNORMAL HIGH (ref 0.0–38.6)

## 2020-11-11 ENCOUNTER — Inpatient Hospital Stay: Payer: PPO

## 2020-11-11 ENCOUNTER — Inpatient Hospital Stay: Payer: PPO | Admitting: Hematology

## 2020-11-12 ENCOUNTER — Telehealth: Payer: Self-pay | Admitting: Hematology

## 2020-11-12 NOTE — Telephone Encounter (Signed)
R/s appt per 11/2 sch msg - pt is aware of appt date and time   

## 2020-11-17 NOTE — Progress Notes (Signed)
[place]   [phone] [phone]   Clinic Follow up Note   Patient Care Team: Lajean Manes, MD as PCP - [place], [place], MD as Consulting Physician (General Surgery) Truitt Merle, MD as Consulting Physician (Hematology) [name], MD as Consulting Physician (Radiation Oncology) Sylvan Cheese, NP as Nurse Practitioner (Hematology and Oncology) Karen Kays, NP as Nurse Practitioner (Nurse Practitioner) 11/18/2020  CHIEF COMPLAINT: Follow-up lobular carcinoma of the left breast  SUMMARY OF ONCOLOGIC HISTORY: Oncology History Overview Note  Cancer of central portion of left female breast [facility])   Staging form: Breast, AJCC 7th Edition     Pathologic stage from 08/04/2010: Stage IIB (T2, N1a, cM0) - Signed by Truitt Merle, MD on 12/09/2015     Cancer of central portion of left female breast ([place])  08/03/2010 Mammogram    left nipple retraction, and the palpable mass subareolar left breast at the 6:00 position. Ultrasound confirmed the presence of a mass measuring 2.6 x 1.7 x 2.4 cm   08/04/2010 Initial Biopsy   left breast mass biopsy showed an invasive mammary carcinoma with lobular features. ER 95%, PR 97%, Ki-67 17%, HER-2/neu (-)   08/04/2010 Pathologic Stage   Stage IIB: T2 N1a   08/11/2010 Imaging   left breast mass 4.6 x 4.2 x 2.3 cm with enhancement distortion extending to the nipple retraction. There is questionable anterior mediastinal lymph node seen   07/2010 - 09/03/2010 Anti-estrogen oral therapy   neoadjuvant letrozole    09/03/2010 Surgery    left breast mastectomy with sentinel node biopsy on 09/03/2010.   08/2010 - 01/2015 Anti-estrogen oral therapy   Tamoxifen, pt stopped on her own    12/03/2010 - 01/23/2011 Radiation Therapy   left chest wall and axilla radiaiton    10/19/2015 Progression   Left chest wall recurrence, s/p resection on 11/25/2015 with positive margins ([facility])    12/10/2015 - 05/31/2017  Anti-estrogen oral therapy   anastrozole 55m daily, which was switched to Tamoxifen on 06/30/2016 due to persistent disease.   01/03/2016 - 03/03/2016 Radiation Therapy   Radiation to left chest wall recurrence (12/06/2015-02/04/2016 - 44 Gy), pt progressed through the radiation, and had additional 10 fractions of radiation (02/21/16-03/03/2016 - total 70 Gy to the left chest wall)   07/14/2016 PET scan   PET 07/14/2016 IMPRESSION: 1. No findings of recurrent malignancy. 2. Radiation fibrosis in the lingula. A small left axillary lymph node is not hypermetabolic and only 6 mm in diameter. This is in the vicinity of the prior axillary cyst. 3. Pelvic floor laxity.   07/17/2016 Surgery   Chest wall soft tissue resection for local recurrence   07/17/2016 Pathology Results   Left chest wall two soft tissue resection both showed invasive lobular carcinoma, margins were positive. ER 95% positive, PR 60% positive, HER-2 negative.   07/27/2016 Imaging   CT chest, abdomen and pelvis wo contrast  07/27/2016 IMPRESSION: Sigmoid diverticulosis without acute diverticulitis. No bowel obstruction or acute inflammation. Degenerative disc disease and facet arthropathy of the lower lumbar spine with slight grade 1 anterolisthesis of L4 on L5. Stable appearing simple left-sided renal cysts.   03/01/2017 Mammogram   Mammogram 03/01/2017 IMPRESSION: No mammographic evidence of malignancy. A result letter of this screening mammogram will be mailed directly to the patient. RECOMMENDATION: Screening mammogram in one year   04/10/2017 Imaging   PET IMPRESSION: 1. Stable exam.  No findings of recurrent malignancy. 2. Changes of external beam radiation again noted  within the lingula. 3. Small left axillary lymph node exhibits mild increased uptake. Unchanged from previous exam.   06/07/2017 -  Anti-estrogen oral therapy   Stopped Tamoxifen and Began monthly Faslodex IM injection on 06/07/17  -Held after 09/26/19 due to lost  f/u. Restart with loading dose on 04/29/20.    09/19/2017 Imaging   CT CAP WO Contrast 09/19/17 IMPRESSION: 1. Surgical changes from a left mastectomy without CT findings to suggest recurrent chest wall tumor or axillary lymphadenopathy. 2. No findings to suggest metastatic disease involving the neck, chest, abdomen or pelvis or osseous structures. 3. Stable radiation changes involving the lingula.    09/19/2017 Imaging   Bone Scan Whole Body  IMPRESSION: No definite scintigraphic evidence of osseous metastatic disease. Scattered degenerative type uptake as above.   03/11/2018 PET scan   IMPRESSION:  1. No evidence of recurrent disease. 2.  Aortic atherosclerosis    09/03/2018 PET scan   09/03/2018 PET Scan IMPRESSION: 1. No findings to suggest locally recurrent disease or metastatic disease in the neck, chest, abdomen or pelvis. 2. Hepatic steatosis. 3. Colonic diverticulosis without evidence of acute diverticulitis at this time. 4. Aortic atherosclerosis. 5. Additional incidental findings, as above.   11/25/2018 Imaging   DG Ribs Unilateral W/Chest Left  IMPRESSION: Stable left lingular scarring. Normal left ribs. No acute cardiopulmonary abnormality seen.    04/04/2019 PET scan   PET  IMPRESSION: 1. No evidence of hypermetabolic recurrent or metastatic disease. 2. Hepatic steatosis. 3.  Aortic Atherosclerosis (ICD10-I70.0).   05/18/2020 PET scan   IMPRESSION: 1. Findings of LEFT mastectomy and axillary dissection with radiation changes, no signs of metastatic disease or disease recurrence at this time. 2. Hepatic steatosis. 3. Aortic atherosclerosis.   Aortic Atherosclerosis (ICD10-I70.0).     11/09/2020 PET scan   IMPRESSION: 1. No findings of hypermetabolic metastatic disease in the neck, chest, abdomen or pelvis. 2. Hepatic steatosis. 3. Aortic atherosclerosis. 4. Increased metabolism about the sphincter complex and lower rectum more likely physiologic;  however, consider digital rectal exam and correlation with any symptoms in this location.   11/09/2020 PET scan   IMPRESSION: 1. No findings of hypermetabolic metastatic disease in the neck, chest, abdomen or pelvis. 2. Hepatic steatosis. 3. Aortic atherosclerosis. 4. Increased metabolism about the sphincter complex and lower rectum more likely physiologic; however, consider digital rectal exam and correlation with any symptoms in this location.     CURRENT THERAPY: Monthly Faslodex injection started on 06/07/17. Held after 09/26/19 due to lost f/u. Restart with loading dose(every 2 weeks for 3 injections)on 04/29/20.  INTERVAL HISTORY: Gloria [name] returns for follow-up as scheduled.  She continues every 4 week fulvestrant and underwent restaging PET scan on 1/14.  After her last visit she had MSK pain, took 1 dose pain pill and fell on her nose, now has deviated septum. She also developed "laryngitis" saw PCP and ENT, treated with 3 rounds of antibiotics plus steroids. She finally seems to have recovered but now wheezes occasionally. Denies fever/chills, chest pain, or dyspnea. She has minor aches and pains, no significant bone pain. Denies new mass/nodularity along the chest wall. Denies hot flashes. Energy and appetite are adequate. She has diverticulosis, started probiotic which has increased stool output. Denies rectal pain or bleeding.    MEDICAL HISTORY:  Past Medical History:  Diagnosis Date  . Anxiety   . Blood in urine   . CAD (coronary artery disease)    non obstructive by cath  . Cancer ([place])  breast- left  . Depression   . Family history of breast cancer   . Family history of colon cancer   . Family history of ovarian cancer   . Family history of pancreatic cancer   . Family history of prostate cancer   . GERD (gastroesophageal reflux disease)   . Hypertension   . Knee fracture   . Personal history of radiation therapy 2017  . TR (tricuspid regurgitation)    mild  by Echo 12/2008 EF >55%    SURGICAL HISTORY: Past Surgical History:  Procedure Laterality Date  . ABDOMINAL AORTAGRAM N/A 09/08/2011   Procedure: ABDOMINAL Maxcine Ham;  Surgeon: Troy Sine, MD;  Location: [facility] CATH LAB;  Service: Cardiovascular;  Laterality: N/A;  . ABDOMINAL HYSTERECTOMY    . BREAST EXCISIONAL BIOPSY Left 2017   X3  . BREAST SURGERY  08-30-10   mastectomy  . CARDIAC CATHETERIZATION  08/2011   20% LAD stenosis, 20% diagonal stenosis, 10-20% proximal dominant RCA stenosis  . COLON SURGERY    . LEFT HEART CATHETERIZATION WITH CORONARY ANGIOGRAM N/A 09/08/2011   Procedure: LEFT HEART CATHETERIZATION WITH CORONARY ANGIOGRAM;  Surgeon: Troy Sine, MD;  Location[facility] C[facility] LAB;  Service: Cardiovascular;  Laterality: N/A;  . MASTECTOMY    . MINOR BREAST BIOPSY Left 11/25/2015   Procedure: MINOR EXCISION MASS LEFT CHEST WALL;  Surgeon: Autumn Messing III, MD;  Location: [place];  Service: General;  Laterality: Left;  . MINOR BREAST BIOPSY Left 07/17/2016   Procedure: EXCISION OF 2 LEFT CHEST WALL MASSES;  Surgeon: Autumn Messing III, MD;  Location: [place];  Service: General;  Laterality: Left;  . TONSILLECTOMY      I have reviewed the social history and family history with the patient and they are unchanged from previous note.  ALLERGIES:  is allergic to contrast media [iodinated diagnostic agents], penicillins, adhesive [tape], cephalexin, ciprofloxacin, demerol, meperidine hcl, sulfa antibiotics, sulfamethoxazole-trimethoprim, and quinolones.  MEDICATIONS:  Current Outpatient Medications  Medication Sig Dispense Refill  . acetaminophen (TYLENOL) 500 MG tablet Take 1,000 mg by mouth daily as needed for moderate pain. Reported on 03/10/2016    . ALPRAZolam (XANAX) 0.25 MG tablet TAKE 1/2 (ONE-HALF) TABLET BY MOUTH IN THE MORNING AND 1 AT BEDTIME AS NEEDED 30 tablet 0  . amLODipine (NORVASC) 2.5 MG tablet amlodipine 2.5 mg tablet  TK 1 T PO QD    .  aspirin 81 MG chewable tablet Chew 81 mg by mouth once a week. Reported on 03/10/2016    . CALCIUM-MAGNESIUM-VITAMIN D PO Take 1 tablet by mouth 2 (two) times daily.    Marland Kitchen doxepin (SINEQUAN) 10 MG capsule Take 10 mg by mouth at bedtime.     Marland Kitchen doxycycline (VIBRA-TABS) 100 MG tablet Take 100 mg by mouth 2 (two) times daily.    . DULoxetine (CYMBALTA) 20 MG capsule Take 1 capsule (20 mg total) by mouth daily. 30 capsule 1  . fluticasone (FLONASE) 50 MCG/ACT nasal spray Place 1 spray into both nostrils daily as needed for allergies.     Marland Kitchen guaiFENesin (MUCINEX) 600 MG 12 hr tablet Take 600 mg by mouth daily as needed for cough or to loosen phlegm. Reported on 03/28/2016    . hydrochlorothiazide (MICROZIDE) 12.5 MG capsule Take 1 capsule by mouth once daily 90 capsule 0  . losartan (COZAAR) 25 MG tablet 25 mg at bedtime.     Marland Kitchen losartan (COZAAR) 50 MG tablet Take 1 tablet by mouth once daily  90 tablet 1  . nitroGLYCERIN (NITROSTAT) 0.4 MG SL tablet Place 1 tablet (0.4 mg total) under the tongue every 5 (five) minutes as needed for chest pain. <PLEASE MAKE APPOINTMENT> 25 tablet 3  . omeprazole (PRILOSEC) 20 MG capsule omeprazole 20 mg capsule,delayed release    . pseudoephedrine-acetaminophen (TYLENOL SINUS) 30-500 MG TABS tablet Take 1 tablet by mouth every 4 (four) hours as needed.    . senna (SENOKOT) 8.6 MG tablet Take 1 tablet by mouth every other day.    . triamcinolone (KENALOG) 0.025 % cream Apply 1 application topically 2 (two) times daily. 30 g 0   No current facility-administered medications for this visit.   Facility-Administered Medications Ordered in Other Visits  Medication Dose Route Frequency Provider Last Rate Last Admin  . fulvestrant (FASLODEX) injection 500 mg  500 mg Intramuscular Q30 days Truitt Merle, MD   500 mg at 11/18/20 1416    PHYSICAL EXAMINATION: ECOG PERFORMANCE STATUS: 1 - Symptomatic but completely ambulatory  Vitals:   11/18/20 1338  BP: (!) 138/91  Pulse: 96   Resp: 18  Temp: 98.9 F (37.2 C)  SpO2: 99%   Filed Weights   11/18/20 1338  Weight: 134 lb (60.8 kg)    GENERAL:alert, no distress and comfortable SKIN: scattered seborrheic keratoses, no rash  EYES: sclera clear NECK: without mass LYMPH:  no palpable cervical, supraclavicular, or axillary lymphadenopathy  LUNGS: clear with normal breathing effort, no wheezes HEART: regular rate & rhythm, no lower extremity edema ABDOMEN:abdomen soft, non-tender and normal bowel sounds Musculoskeletal: no focal tenderness  NEURO: alert & oriented x 3 with fluent speech, no focal motor/sensory deficits Breast: s/p left mastectomy. Prominent tissue along the incision without palpable mass/nodularity along the chest wall, right breast, or either axilla that I could appreciate    LABORATORY DATA:  I have reviewed the data as listed CBC Latest Ref Rng & Units 11/09/2020 08/19/2020 05/27/2020  WBC 4.0 - 10.5 K/uL 3.3(L) 4.6 3.9(L)  Hemoglobin 12.0 - 15.0 g/dL 13.5 11.9(L) 12.2  Hematocrit 36.0 - 46.0 % 41.2 36.5 37.5  Platelets 150 - 400 K/uL 168 156 144(L)     CMP Latest Ref Rng & Units 11/09/2020 08/19/2020 05/27/2020  Glucose 70 - 99 mg/dL 99 97 87  BUN 8 - 23 mg/dL 14 19 21   Creatinine 0.44 - 1.00 mg/dL 1.01(H) 0.90 0.96  Sodium 135 - 145 mmol/L 142 141 144  Potassium 3.5 - 5.1 mmol/L 3.9 3.8 3.7  Chloride 98 - 111 mmol/L 107 106 108  CO2 22 - 32 mmol/L 28 27 26   Calcium 8.9 - 10.3 mg/dL 9.6 9.6 9.9  Total Protein 6.5 - 8.1 g/dL 6.4(L) 6.6 6.3(L)  Total Bilirubin 0.3 - 1.2 mg/dL 0.8 0.3 0.4  Alkaline Phos 38 - 126 U/L 84 85 76  AST 15 - 41 U/L 24 22 23   ALT 0 - 44 U/L 19 14 14       RADIOGRAPHIC STUDIES: I have personally reviewed the radiological images as listed and agreed with the findings in the report. No results found.   ASSESSMENT & PLAN: Gloria Skiver Pattonis a 87 y.o.femalewith   1. Cancer of the central portion of left female breast, Stage IIB, T2 N1 invasive lobular  carcinoma, grade 2, ER 95%, PR 97%, Ki-67 17%, HER-2/neu no amplification, diagnosed in 07/2010, local chest wall recurrence in 09/2015  -She wasdiagnosed in 10/2011but hadchest wall local recurrence in 09/2015.She was on adjuvant tamoxifen for 4.5 years, stopped on her own anddeveloped  local recurrence 8 months after she stopped tamoxifen. -She has completed chest wall radiation, however developedlocal recurrence again andunderwent a second chest wall surgery for two soft tissue resection in 06/2016. Unfortunately the surgicalmargins were positive, Dr. Marlou Starks Dr. Marlou Starks did not offer more surgery because complete surgical resection is unlikely. Patient is also not in favor of more extensive surgery. -She began monthly fulvestrant injection on 06/07/2017, she has been tolerating well overall. She was lost to f/up and was off from 08/2019 - 04/2020 and resumed with loading dose  -restaging have showed no evidence of disease   2. HTN, CAD  -per PCP -stable   3. Osteoporosis  -Her last DEXA scan in 06/2012 showed osteoporosis, recommended calcium and vitamin D -continue screening per PCP   4. Left Chest Pain  -chronic, likely secondary to multiple surgeries  -tried cymbalta, due to dizziness switched to doxepin per PCP. She fell on her nose right after taking that.   5. Goal of care discussion  - treatment goal is palliative, full code   6. Laryngitis, respiratory infection -She developed laryngitis, managed by PCP and ENT, completed 3 courses of antibiotics and steroids.  -exam benign, no consolidation or pleural effusion on PET scan 11/09/20 -continue f/up with PCP, ENT  Dispo:  Gloria [name] is clinically doing well from a breast cancer standpoint. She continues monthly faslodex which she tolerates well. Exam is benign, labs stable. I reviewed her PET scan from 11/09/20 which shows no evidence of hypermetabolic malignancy.  She will continue monthly faslodex, f/up in 2 months.    The  plan was reviewed with [name].   All questions were answered. The patient knows to call the clinic with any problems, questions or concerns. No barriers to learning were detected. Total encounter time was 30 minutes.      Alla Feeling, NP 11/18/20

## 2020-11-18 ENCOUNTER — Inpatient Hospital Stay: Payer: PPO | Admitting: Nurse Practitioner

## 2020-11-18 ENCOUNTER — Inpatient Hospital Stay: Payer: PPO

## 2020-11-18 ENCOUNTER — Other Ambulatory Visit: Payer: Self-pay

## 2020-11-18 ENCOUNTER — Encounter: Payer: Self-pay | Admitting: Nurse Practitioner

## 2020-11-18 VITALS — BP 138/91 | HR 96 | Temp 98.9°F | Resp 18 | Ht 60.0 in | Wt 134.0 lb

## 2020-11-18 DIAGNOSIS — Z170 Estrogen receptor positive status [ER+]: Secondary | ICD-10-CM | POA: Diagnosis not present

## 2020-11-18 DIAGNOSIS — C50112 Malignant neoplasm of central portion of left female breast: Secondary | ICD-10-CM | POA: Diagnosis not present

## 2020-11-18 MED ORDER — FULVESTRANT 250 MG/5ML IM SOLN
INTRAMUSCULAR | Status: AC
  Filled 2020-11-18: qty 5

## 2020-11-18 MED ORDER — FULVESTRANT 250 MG/5ML IM SOLN
500.0000 mg | INTRAMUSCULAR | Status: DC
  Administered 2020-11-18: 500 mg via INTRAMUSCULAR
  Filled 2020-11-18: qty 10

## 2020-11-18 NOTE — Patient Instructions (Signed)
Fulvestrant injection What is this medicine? FULVESTRANT (ful VES trant) blocks the effects of estrogen. It is used to treat breast cancer. This medicine may be used for other purposes; ask your health care provider or pharmacist if you have questions. COMMON BRAND NAME(S): FASLODEX What should I tell my health care provider before I take this medicine? They need to know if you have any of these conditions:  bleeding disorders  liver disease  low blood counts, like low white cell, platelet, or red cell counts  an unusual or allergic reaction to fulvestrant, other medicines, foods, dyes, or preservatives  pregnant or trying to get pregnant  breast-feeding How should I use this medicine? This medicine is for injection into a muscle. It is usually given by a health care professional in a hospital or clinic setting. Talk to your pediatrician regarding the use of this medicine in children. Special care may be needed. Overdosage: If you think you have taken too much of this medicine contact a poison control center or emergency room at once. NOTE: This medicine is only for you. Do not share this medicine with others. What if I miss a dose? It is important not to miss your dose. Call your doctor or health care professional if you are unable to keep an appointment. What may interact with this medicine?  medicines that treat or prevent blood clots like warfarin, enoxaparin, dalteparin, apixaban, dabigatran, and rivaroxaban This list may not describe all possible interactions. Give your health care provider a list of all the medicines, herbs, non-prescription drugs, or dietary supplements you use. Also tell them if you smoke, drink alcohol, or use illegal drugs. Some items may interact with your medicine. What should I watch for while using this medicine? Your condition will be monitored carefully while you are receiving this medicine. You will need important blood work done while you are taking  this medicine. Do not become pregnant while taking this medicine or for at least 1 year after stopping it. Women of child-bearing potential will need to have a negative pregnancy test before starting this medicine. Women should inform their doctor if they wish to become pregnant or think they might be pregnant. There is a potential for serious side effects to an unborn child. Men should inform their doctors if they wish to father a child. This medicine may lower sperm counts. Talk to your health care professional or pharmacist for more information. Do not breast-feed an infant while taking this medicine or for 1 year after the last dose. What side effects may I notice from receiving this medicine? Side effects that you should report to your doctor or health care professional as soon as possible:  allergic reactions like skin rash, itching or hives, swelling of the face, lips, or tongue  feeling faint or lightheaded, falls  pain, tingling, numbness, or weakness in the legs  signs and symptoms of infection like fever or chills; cough; flu-like symptoms; sore throat  vaginal bleeding Side effects that usually do not require medical attention (report to your doctor or health care professional if they continue or are bothersome):  aches, pains  constipation  diarrhea  headache  hot flashes  nausea, vomiting  pain at site where injected  stomach pain This list may not describe all possible side effects. Call your doctor for medical advice about side effects. You may report side effects to FDA at 1-800-FDA-1088. Where should I keep my medicine? This drug is given in a hospital or clinic and will   not be stored at home. NOTE: This sheet is a summary. It may not cover all possible information. If you have questions about this medicine, talk to your doctor, pharmacist, or health care provider.  2021 Elsevier/Gold Standard (2018-01-24 11:34:41)  

## 2020-11-20 DIAGNOSIS — Z03818 Encounter for observation for suspected exposure to other biological agents ruled out: Secondary | ICD-10-CM | POA: Diagnosis not present

## 2020-11-20 DIAGNOSIS — Z20822 Contact with and (suspected) exposure to covid-19: Secondary | ICD-10-CM | POA: Diagnosis not present

## 2020-11-20 DIAGNOSIS — R059 Cough, unspecified: Secondary | ICD-10-CM | POA: Diagnosis not present

## 2020-11-25 ENCOUNTER — Telehealth: Payer: Self-pay | Admitting: Cardiovascular Disease

## 2020-11-25 DIAGNOSIS — Z79899 Other long term (current) drug therapy: Secondary | ICD-10-CM | POA: Diagnosis not present

## 2020-11-25 DIAGNOSIS — I10 Essential (primary) hypertension: Secondary | ICD-10-CM | POA: Diagnosis not present

## 2020-11-25 DIAGNOSIS — R600 Localized edema: Secondary | ICD-10-CM | POA: Diagnosis not present

## 2020-11-25 DIAGNOSIS — J449 Chronic obstructive pulmonary disease, unspecified: Secondary | ICD-10-CM | POA: Diagnosis not present

## 2020-11-25 NOTE — Telephone Encounter (Signed)
I called and spoke with the patient. She advised she has been having lower extremity swelling x 1 week. She also describes some fullness to her abdomen.   She states her daughter started her on a probiotic about 3 weeks ago.   The patient denies SOB.   She reports her daughter came in last night to see her and was concerned about her swelling, so she called Dr. Carlyle Lipa office and they can see her today at 11:00 am.  I advised the patient that lower extremity swelling can come from the heart or from venous insufficiency. She does confirm her swelling is better first thing in the morning, but as soon as she stands up, this starts up again.   I have advised the patient that we have not had a visit with her since 08/2019 and if Dr. Felipa Eth can see her today, then she should go to this appointment.   I have also advised her we need to schedule a follow up appointment with her as she is overdue.  The patient is refusing to see anyone but Dr. Claiborne Billings.  I have reviewed his schedule and he does have an opening next Thursday 12/02/20 at 4:20 pm.  The patient is agreeable with this appointment and confirms this.  I have advised her to elevate her lower extremities as much as possible and to watch her salt intake.   The patient voices understanding of the above and is agreeable.  She was appreciative for the call back.

## 2020-11-25 NOTE — Telephone Encounter (Signed)
Pt c/o swelling: STAT is pt has developed SOB within 24 hours  1) How much weight have you gained and in what time span? No noticeable weight gain  2) If swelling, where is the swelling located? Both legs  3) Are you currently taking a fluid pill? Yes   4) Are you currently SOB? No   5) Do you have a log of your daily weights (if so, list)? No log available   6) Have you gained 3 pounds in a day or 5 pounds in a week? No   7) Have you traveled recently? No

## 2020-12-02 ENCOUNTER — Ambulatory Visit: Payer: PPO | Admitting: Cardiovascular Disease

## 2020-12-02 ENCOUNTER — Other Ambulatory Visit: Payer: Self-pay

## 2020-12-02 ENCOUNTER — Encounter: Payer: Self-pay | Admitting: Cardiovascular Disease

## 2020-12-02 VITALS — BP 118/78 | HR 86 | Ht 61.0 in | Wt 133.6 lb

## 2020-12-02 DIAGNOSIS — K760 Fatty (change of) liver, not elsewhere classified: Secondary | ICD-10-CM

## 2020-12-02 DIAGNOSIS — I10 Essential (primary) hypertension: Secondary | ICD-10-CM | POA: Diagnosis not present

## 2020-12-02 DIAGNOSIS — I2510 Atherosclerotic heart disease of native coronary artery without angina pectoris: Secondary | ICD-10-CM

## 2020-12-02 DIAGNOSIS — C50912 Malignant neoplasm of unspecified site of left female breast: Secondary | ICD-10-CM

## 2020-12-02 DIAGNOSIS — M7989 Other specified soft tissue disorders: Secondary | ICD-10-CM

## 2020-12-02 NOTE — Patient Instructions (Signed)
Medication Instructions:  Your Physician recommend you continue on your current medication as directed.    *If you need a refill on your cardiac medications before your next appointment, please call your pharmacy*   Lab Work: None   Testing/Procedures: None   Follow-Up: At [facility], you and your health needs are our priority.  As part of our continuing mission to provide you with exceptional heart care, we have created designated Provider Care Teams.  These Care Teams include your primary Cardiologist (physician) and Advanced Practice Providers (APPs -  Physician Assistants and Nurse Practitioners) who all work together to provide you with the care you need, when you need it.  We recommend signing up for the patient portal called "MyChart".  Sign up information is provided on this After Visit Summary.  MyChart is used to connect with patients for Virtual Visits (Telemedicine).  Patients are able to view lab/test results, encounter notes, upcoming appointments, etc.  Non-urgent messages can be sent to your provider as well.   To learn more about what you can do with MyChart, go to [email or web address].    Your next appointment:   1 year(s)  The format for your next appointment:   In Person  Provider:   Shelva Majestic, MD

## 2020-12-02 NOTE — Progress Notes (Signed)
Patient ID: Gloria [name], female   DOB: Jan 14, 1928, 87 y.o.   MRN: [id number]     Primary M.D.: Dr. Lajean Manes  HPI: Gloria [name] is a 87 y.o. female who presents fora cardiology followup evaluation.  I have not seen her since May 2017.   Gloria [name] has a history of mild nonobstructive CAD involving her LAD, diagonal and right coronary artery by catheterization which was last done by me on 09/08/2011.  Additional problems include a history of hypertension,  breast CA and GERD.Marland Kitchen She status post left mastectomy and radiation therapy and had been on chronic tamoxifen. In the past, she did note some dizziness which she attributed to her tamoxifen. She had noticed some leg discomfort and  Doppler studies were normal. A 2-D echo Doppler study on 05/09/2013 showed an ejection fraction of 55-60% , mild tricuspid regurgitation, mild pulmonary hypertension with estimated PA pressure 34 mm.  ent leg swelling.  I last saw her in May 2017 and since her prior evaluation she had developed recurrent breast cancer and had recently completed several weeks of radiation therapy.  At the time she was on anastrozole.  She has developed a radiation burn from her recent radiation therapy.  She has noticed also some  musculoskeletal tenderness to this region.  She denies any exertionally precipitated chest pressure.  She has been taking 81 mg aspirin.  She has continued to take losartan 50 mg daily for blood pressure control.  She denies any difficulty with palpitations.  She underwent an echo Doppler study in October 2017 which showed an EF of 60 to 65% with normal wall motion.  There was grade 1 diastolic dysfunction.  Her left atrium was mildly dilated.  She was last evaluated in a telemedicine visit by Jory Sims, NP in November 2020.  At a prior office visit 1 month previously she was hypertensive and had lower extremity edema.  Her dose of losartan was increased by Jory Sims to 100 mg and HCTZ was  added to her regimen.    Presently, she is followed by [name] for her breast cancer.  Last week, she was eating chicken noodle soup for several days which resulted in significant leg edema which ultimately improved with HCTZ.  Presently, she is on losartan 50 mg in the morning and 25 mg at night in addition to HCTZ 12.5 mg.  She is no longer taking aspirin.  Her most recent PET scan in January 2022 did not reveal findings of hypermetabolic metastatic disease in the neck chest abdomen or pelvis. There was evidence for hepatic steatosis as well as aortic atherosclerosis.  She denies any chest pain.  She lives by herself in a house in [place] with family members close by.  She presents for cardiology reevaluation.  Past Medical History:  Diagnosis Date  . Anxiety   . Blood in urine   . CAD (coronary artery disease)    non obstructive by cath  . Cancer [facility])    breast- left  . Depression   . Family history of breast cancer   . Family history of colon cancer   . Family history of ovarian cancer   . Family history of pancreatic cancer   . Family history of prostate cancer   . GERD (gastroesophageal reflux disease)   . Hypertension   . Knee fracture   . Personal history of radiation therapy 2017  . TR (tricuspid regurgitation)    mild by Echo 12/2008 EF >55%  Past Surgical History:  Procedure Laterality Date  . ABDOMINAL AORTAGRAM N/A 09/08/2011   Procedure: ABDOMINAL Maxcine Ham;  Surgeon: Troy Sine, MD;  Location: [facility] CATH LAB;  Service: Cardiovascular;  Laterality: N/A;  . ABDOMINAL HYSTERECTOMY    . BREAST EXCISIONAL BIOPSY Left 2017   X3  . BREAST SURGERY  08-30-10   mastectomy  . CARDIAC CATHETERIZATION  08/2011   20% LAD stenosis, 20% diagonal stenosis, 10-20% proximal dominant RCA stenosis  . COLON SURGERY    . LEFT HEART CATHETERIZATION WITH CORONARY ANGIOGRAM N/A 09/08/2011   Procedure: LEFT HEART CATHETERIZATION WITH CORONARY ANGIOGRAM;  Surgeon: Troy Sine, MD;   Location: [facility] CATH LAB;  Service: Cardiovascular;  Laterality: N/A;  . MASTECTOMY    . MINOR BREAST BIOPSY Left 11/25/2015   Procedure: MINOR EXCISION MASS LEFT CHEST WALL;  Surgeon: Autumn Messing III, MD;  Location: [place];  Service: General;  Laterality: Left;  . MINOR BREAST BIOPSY Left 07/17/2016   Procedure: EXCISION OF 2 LEFT CHEST WALL MASSES;  Surgeon: Autumn Messing III, MD;  Location: [place];  Service: General;  Laterality: Left;  . TONSILLECTOMY      Allergies  Allergen Reactions  . Contrast Media [Iodinated Diagnostic Agents] Anaphylaxis    09/19/17-Pt premedicated. Then pt stated she was DNR. Spoke with [name] who did not realize allergy was recorded as anaphylactic. [name] verbally gave ok to change CT exam to without IV contrast. Pt and daughter do not recall occurrence of anaphylactic reaction to contrast media. Geanie Kenning, 3:27pm  . Penicillins Anaphylaxis and Swelling    Has patient had a PCN reaction causing immediate rash, facial/tongue/throat swelling, SOB or lightheadedness with hypotension: Yes Has patient had a PCN reaction causing severe rash involving mucus membranes or skin necrosis: No Has patient had a PCN reaction that required hospitalization Yes Has patient had a PCN reaction occurring within the last 10 years: No If all of the above answers are "NO", then may proceed with Cephalosporin use.   . Adhesive [Tape] Other (See Comments)    blisters  . Cephalexin Swelling  . Ciprofloxacin Swelling  . Demerol Nausea And Vomiting  . Meperidine Hcl   . Sulfa Antibiotics   . Sulfamethoxazole-Trimethoprim Itching and Swelling    REACTION: swelling/hives  . Quinolones Itching and Rash    REACTION: itching, rash Pt. Reports no problems with levaquin    Current Outpatient Medications  Medication Sig Dispense Refill  . acetaminophen (TYLENOL) 500 MG tablet Take 1,000 mg by mouth daily as needed for moderate pain. Reported on  03/10/2016    . ALPRAZolam (XANAX) 0.25 MG tablet TAKE 1/2 (ONE-HALF) TABLET BY MOUTH IN THE MORNING AND 1 AT BEDTIME AS NEEDED 30 tablet 0  . aspirin 81 MG chewable tablet Chew 81 mg by mouth once a week. Reported on 03/10/2016    . benzonatate (TESSALON) 100 MG capsule Take 100 mg by mouth 3 (three) times daily.    Marland Kitchen CALCIUM-MAGNESIUM-VITAMIN D PO Take 1 tablet by mouth 2 (two) times daily.    Marland Kitchen doxepin (SINEQUAN) 10 MG capsule Take 10 mg by mouth at bedtime.    Marland Kitchen doxycycline (VIBRA-TABS) 100 MG tablet Take 100 mg by mouth 2 (two) times daily.    Marland Kitchen FLOVENT HFA 44 MCG/ACT inhaler Inhale into the lungs.    . fluticasone (FLONASE) 50 MCG/ACT nasal spray Place 1 spray into both nostrils daily as needed for allergies.     Marland Kitchen guaiFENesin (Dale)  600 MG 12 hr tablet Take 600 mg by mouth daily as needed for cough or to loosen phlegm. Reported on 03/28/2016    . hydrochlorothiazide (MICROZIDE) 12.5 MG capsule Take 1 capsule by mouth once daily 90 capsule 0  . losartan (COZAAR) 25 MG tablet 25 mg at bedtime.     Marland Kitchen losartan (COZAAR) 50 MG tablet Take 1 tablet by mouth once daily 90 tablet 1  . nitroGLYCERIN (NITROSTAT) 0.4 MG SL tablet Place 1 tablet (0.4 mg total) under the tongue every 5 (five) minutes as needed for chest pain. <PLEASE MAKE APPOINTMENT> 25 tablet 3  . omeprazole (PRILOSEC) 20 MG capsule omeprazole 20 mg capsule,delayed release    . pseudoephedrine-acetaminophen (TYLENOL SINUS) 30-500 MG TABS tablet Take 1 tablet by mouth every 4 (four) hours as needed.    . senna (SENOKOT) 8.6 MG tablet Take 1 tablet by mouth every other day.    . triamcinolone (KENALOG) 0.025 % cream Apply 1 application topically 2 (two) times daily. 30 g 0   No current facility-administered medications for this visit.    Social History   Socioeconomic History  . Marital status: Divorced    Spouse name: Not on file  . Number of children: Not on file  . Years of education: Not on file  . Highest education level:  Not on file  Occupational History  . Not on file  Tobacco Use  . Smoking status: Former Smoker    Quit date: 11/15/1991    Years since quitting: 29.0  . Smokeless tobacco: Never Used  Substance and Sexual Activity  . Alcohol use: Yes    Alcohol/week: 2.0 standard drinks    Types: 2 Glasses of wine per week    Comment: 1 glass per week  . Drug use: No  . Sexual activity: Not Currently  Other Topics Concern  . Not on file  Social History Narrative  . Not on file   Social Determinants of Health   Financial Resource Strain: Not on file  Food Insecurity: Not on file  Transportation Needs: Not on file  Physical Activity: Not on file  Stress: Not on file  Social Connections: Not on file  Intimate Partner Violence: Not on file    Family History  Problem Relation Age of Onset  . Prostate cancer Father 56       metastatic  . Heart disease Sister   . Breast cancer Sister 47  . Pancreatic cancer Brother        dx in his 32s  . Colon cancer Maternal Grandmother        dx <50  . Dementia Mother   . Sudden death Brother   . Heart attack Brother   . Heart attack Brother   . Arthritis Sister   . Cancer Paternal Uncle        unknown type  . Ovarian cancer Niece 63    ROS General: Negative; No fevers, chills, or night sweats;  HEENT: Positive for a right hearing aid; No changes in vision, sinus congestion, difficulty swallowing Pulmonary: Negative; No cough, wheezing, shortness of breath, hemoptysis Cardiovascular: No chest pain, presyncope, syncope, palpitations; recent leg swelling contributed by diet GI: Negative; No nausea, vomiting, diarrhea, or abdominal pain GU: Negative; No dysuria, hematuria, or difficulty voiding Musculoskeletal: Negative; no myalgias, joint pain, or weakness Hematologic/Oncology: Positive for recurrent breast CA, status post recent chemotherapy with radiation-induced skin injury Endocrine: Negative; no heat/cold intolerance; no diabetes Neuro:  Negative; no changes in balance, headaches  Skin: Negative; No rashes or skin lesions Psychiatric: Negative; No behavioral problems, depression Sleep: Negative; No snoring, daytime sleepiness, hypersomnolence, bruxism, restless legs, hypnogognic hallucinations, no cataplexy Other comprehensive 14 point system review is negative.    PE BP 118/78   Pulse 86   Ht 5' 1"  (1.549 m)   Wt 133 lb 9.6 oz (60.6 kg)   BMI 25.24 kg/m    Repeat blood pressure by me was 132/76  Wt Readings from Last 3 Encounters:  12/02/20 133 lb 9.6 oz (60.6 kg)  11/18/20 134 lb (60.8 kg)  08/19/20 134 lb 8 oz (61 kg)   General: Alert, oriented, no distress.  Skin: normal turgor, no rashes, warm and dry HEENT: Normocephalic, atraumatic. Pupils equal round and reactive to light; sclera anicteric; extraocular muscles intact;  Nose without nasal septal hypertrophy Mouth/Parynx benign;  Neck: No JVD, no carotid bruits; normal carotid upstroke Lungs: clear to ausculatation and percussion; no wheezing or rales Chest wall: without tenderness to palpitation Heart: PMI not displaced, RRR, s1 s2 normal, 1/6 systolic murmur, no diastolic murmur, no rubs, gallops, thrills, or heaves Abdomen: soft, nontender; no hepatosplenomehaly, BS+; abdominal aorta nontender and not dilated by palpation. Back: no CVA tenderness Pulses 2+ Musculoskeletal: full range of motion, normal strength, no joint deformities Extremities: no clubbing cyanosis or edema, Homan's sign negative  Neurologic: grossly nonfocal; Cranial nerves grossly wnl Psychologic: Normal mood and affect    General: Alert, oriented, no distress.  Skin: Skin breakdown secondary to radiation extending from the left midaxillary line to the sternum HEENT: Normocephalic, atraumatic. Pupils round and reactive; sclera anicteric;no lid lag. No xanthelasmas Nose without nasal septal hypertrophy Mouth/Parynx benign; Mallinpatti scale 2 Neck: No JVD, no carotid bruits with  normal carotid upstroke Lungs: clear to ausculatation and percussion; no wheezing or rales Chest wall: Significant erythema with tenderness secondary to radiation-induced skin injury extending from the left mid axillary line to the sternum Heart: RRR, s1 s2 normal; 1/6 sem; no diastolic murmur, S3 or S4. No rubs thrills or heaves the Abdomen: soft, nontender; no hepatosplenomehaly, BS+; abdominal aorta nontender and not dilated by palpation. Back: No CVA tenderness Pulses 2+ Extremities: He notes complete resolution of previous lower extremity edema contributed by eating salty chicken noodle soup for several days; no clubbing cyanosis , Homan's sign negative  Neurologic: grossly nonfocal; Cranial nerves intact Psychological: Normal affect and mood  ECG (independently read by me): Normal sinus rhythm at 86 bpm.  LVH by voltage criteria.  Nondiagnostic small lateral Q waves.    May 2017 ECG: An ECG could not be done today due to the patient's severe tenderness from her radiation-induced skin injury  ECG (independently read by me): Normal sinus rhythm at 73 beats per minute. Nonspecific ST changes. No ectopy. Normal intervals.  Prior ECG of 06/03/2013: Normal sinus rhythm at 64 beats per minute. No ectopy. No significant ST changes.  LABS:  BMP Latest Ref Rng & Units 11/09/2020 08/19/2020 05/27/2020  Glucose 70 - 99 mg/dL 99 97 87  BUN 8 - 23 mg/dL 14 19 21   Creatinine 0.44 - 1.00 mg/dL 1.01(H) 0.90 0.96  Sodium 135 - 145 mmol/L 142 141 144  Potassium 3.5 - 5.1 mmol/L 3.9 3.8 3.7  Chloride 98 - 111 mmol/L 107 106 108  CO2 22 - 32 mmol/L 28 27 26   Calcium 8.9 - 10.3 mg/dL 9.6 9.6 9.9   Hepatic Function Latest Ref Rng & Units 11/09/2020 08/19/2020 05/27/2020  Total Protein 6.5 - 8.1 g/dL 6.4(L)  6.6 6.3(L)  Albumin 3.5 - 5.0 g/dL 3.7 3.7 3.6  AST 15 - 41 U/L 24 22 23   ALT 0 - 44 U/L 19 14 14   Alk Phosphatase 38 - 126 U/L 84 85 76  Total Bilirubin 0.3 - 1.2 mg/dL 0.8 0.3 0.4  Bilirubin,  Direct - - - -   CBC Latest Ref Rng & Units 11/09/2020 08/19/2020 05/27/2020  WBC 4.0 - 10.5 K/uL 3.3(L) 4.6 3.9(L)  Hemoglobin 12.0 - 15.0 g/dL 13.5 11.9(L) 12.2  Hematocrit 36.0 - 46.0 % 41.2 36.5 37.5  Platelets 150 - 400 K/uL 168 156 144(L)   Lab Results  Component Value Date   TSH 0.676 05/06/2013   Lab Results  Component Value Date   HGBA1C  07/23/2009    5.2 (NOTE) The ADA recommends the following therapeutic goal for glycemic control related to Hgb A1c measurement: Goal of therapy: <6.5 Hgb A1c  Reference: American Diabetes Association: Clinical Practice Recommendations 2010, Diabetes Care, 2010, 33: (Suppl  1).   Lipid Panel     Component Value Date/Time   CHOL  11/20/2007 0505    129        ATP III CLASSIFICATION:  <200     mg/dL   Desirable  200-239  mg/dL   Borderline High  >=240    mg/dL   High   TRIG 78 11/20/2007 0505   HDL 62 11/20/2007 0505   CHOLHDL 2.1 11/20/2007 0505   VLDL 16 11/20/2007 0505   LDLCALC  11/20/2007 0505    51        Total Cholesterol/HDL:CHD Risk Coronary Heart Disease Risk Table                     Men   Women  1/2 Average Risk   3.4   3.3    RADIOLOGY: No results found.  IMPRESSION:  1. Coronary artery disease involving native heart without angina pectoris, unspecified vessel or lesion type   2. Essential hypertension   3. Hepatic steatosis   4. Recurrent malignant neoplasm of left breast (HCC)   5. Leg swelling     ASSESSMENT AND PLAN: Ms. Vadala is a very pleasant 87 year-old female with documented mild nonobstructive CAD by cardiac catheterization on 09/08/2011.  Since her catheterization, she has been without recurrent anginal symptomatology.  She has a history of hypertension and over the last several years has been on a regimen now consisting of losartan 50 mg in the morning and 25 mg at night.  She has had issues with lower extremity edema and most recently developed increasing leg swelling after 4 days of eating chicken  soup which was fairly salty.  Her edema has significantly resolved and she continues to be on daily HCTZ 12.5 mg.  She had asked me her thoughts regarding aspirin.  With her age of 87 years old I have recommended she stay off aspirin particularly with potential GI bleed risk in light of her age.  She is continuing to undergo treatment for her recurrent breast CA which apparently has been stable.  Remotely she had developed radiation-induced skin injury.  She is now followed closely by Dr.Feng for her breast CA follow-up.  Her ECG today shows normal sinus rhythm in the 80s.  There are small inferior Q waves.  Last echo Doppler study in October 2017 showed an EF at 60 to 65% without wall motion abnormalities and grade 1 diastolic dysfunction.  She has been followed by Dr. Felipa Eth for  primary care.  I am not certain when her last lipid panel was obtained.  I would recommend follow-up lipid panel if one has not been done recently.  She was noted to have hepatic steatosis on her recent PET scan.  Renal function remained stable from laboratory in January 2022 with a creatinine of 0.86 on her dose of losartan.  LFTs were normal.  As long as she remains stable I will see her in 1 year for reevaluation or sooner as needed.  Troy Sine, MD, [facility]  12/05/2020 11:23 AM

## 2020-12-05 ENCOUNTER — Encounter: Payer: Self-pay | Admitting: Cardiovascular Disease

## 2020-12-16 ENCOUNTER — Inpatient Hospital Stay: Payer: PPO | Attending: Hematology

## 2020-12-16 ENCOUNTER — Other Ambulatory Visit: Payer: Self-pay

## 2020-12-16 VITALS — BP 126/73 | HR 86 | Temp 98.6°F | Resp 16

## 2020-12-16 DIAGNOSIS — C50112 Malignant neoplasm of central portion of left female breast: Secondary | ICD-10-CM | POA: Insufficient documentation

## 2020-12-16 DIAGNOSIS — Z79818 Long term (current) use of other agents affecting estrogen receptors and estrogen levels: Secondary | ICD-10-CM | POA: Diagnosis not present

## 2020-12-16 DIAGNOSIS — Z170 Estrogen receptor positive status [ER+]: Secondary | ICD-10-CM | POA: Insufficient documentation

## 2020-12-16 MED ORDER — FULVESTRANT 250 MG/5ML IM SOLN
500.0000 mg | INTRAMUSCULAR | Status: DC
  Administered 2020-12-16: 500 mg via INTRAMUSCULAR

## 2020-12-16 NOTE — Patient Instructions (Signed)
Fulvestrant injection What is this medicine? FULVESTRANT (ful VES trant) blocks the effects of estrogen. It is used to treat breast cancer. This medicine may be used for other purposes; ask your health care provider or pharmacist if you have questions. COMMON BRAND NAME(S): FASLODEX What should I tell my health care provider before I take this medicine? They need to know if you have any of these conditions:  bleeding disorders  liver disease  low blood counts, like low white cell, platelet, or red cell counts  an unusual or allergic reaction to fulvestrant, other medicines, foods, dyes, or preservatives  pregnant or trying to get pregnant  breast-feeding How should I use this medicine? This medicine is for injection into a muscle. It is usually given by a health care professional in a hospital or clinic setting. Talk to your pediatrician regarding the use of this medicine in children. Special care may be needed. Overdosage: If you think you have taken too much of this medicine contact a poison control center or emergency room at once. NOTE: This medicine is only for you. Do not share this medicine with others. What if I miss a dose? It is important not to miss your dose. Call your doctor or health care professional if you are unable to keep an appointment. What may interact with this medicine?  medicines that treat or prevent blood clots like warfarin, enoxaparin, dalteparin, apixaban, dabigatran, and rivaroxaban This list may not describe all possible interactions. Give your health care provider a list of all the medicines, herbs, non-prescription drugs, or dietary supplements you use. Also tell them if you smoke, drink alcohol, or use illegal drugs. Some items may interact with your medicine. What should I watch for while using this medicine? Your condition will be monitored carefully while you are receiving this medicine. You will need important blood work done while you are taking  this medicine. Do not become pregnant while taking this medicine or for at least 1 year after stopping it. Women of child-bearing potential will need to have a negative pregnancy test before starting this medicine. Women should inform their doctor if they wish to become pregnant or think they might be pregnant. There is a potential for serious side effects to an unborn child. Men should inform their doctors if they wish to father a child. This medicine may lower sperm counts. Talk to your health care professional or pharmacist for more information. Do not breast-feed an infant while taking this medicine or for 1 year after the last dose. What side effects may I notice from receiving this medicine? Side effects that you should report to your doctor or health care professional as soon as possible:  allergic reactions like skin rash, itching or hives, swelling of the face, lips, or tongue  feeling faint or lightheaded, falls  pain, tingling, numbness, or weakness in the legs  signs and symptoms of infection like fever or chills; cough; flu-like symptoms; sore throat  vaginal bleeding Side effects that usually do not require medical attention (report to your doctor or health care professional if they continue or are bothersome):  aches, pains  constipation  diarrhea  headache  hot flashes  nausea, vomiting  pain at site where injected  stomach pain This list may not describe all possible side effects. Call your doctor for medical advice about side effects. You may report side effects to FDA at 1-800-FDA-1088. Where should I keep my medicine? This drug is given in a hospital or clinic and will   not be stored at home. NOTE: This sheet is a summary. It may not cover all possible information. If you have questions about this medicine, talk to your doctor, pharmacist, or health care provider.  2021 Elsevier/Gold Standard (2018-01-24 11:34:41)  

## 2020-12-21 ENCOUNTER — Ambulatory Visit
Admission: RE | Admit: 2020-12-21 | Discharge: 2020-12-21 | Disposition: A | Discharge status: Home / Self Care | Payer: PPO | Source: Ambulatory Visit | Attending: Internal Medicine | Admitting: Internal Medicine

## 2020-12-21 ENCOUNTER — Other Ambulatory Visit: Payer: Self-pay | Admitting: Internal Medicine

## 2020-12-21 ENCOUNTER — Other Ambulatory Visit: Payer: Self-pay

## 2020-12-21 DIAGNOSIS — R0781 Pleurodynia: Secondary | ICD-10-CM | POA: Diagnosis not present

## 2020-12-21 DIAGNOSIS — K2900 Acute gastritis without bleeding: Secondary | ICD-10-CM | POA: Diagnosis not present

## 2020-12-21 DIAGNOSIS — M25559 Pain in unspecified hip: Secondary | ICD-10-CM | POA: Diagnosis not present

## 2020-12-21 DIAGNOSIS — W19XXXA Unspecified fall, initial encounter: Secondary | ICD-10-CM | POA: Diagnosis not present

## 2021-01-06 DIAGNOSIS — R059 Cough, unspecified: Secondary | ICD-10-CM | POA: Diagnosis not present

## 2021-01-06 DIAGNOSIS — H66001 Acute suppurative otitis media without spontaneous rupture of ear drum, right ear: Secondary | ICD-10-CM | POA: Diagnosis not present

## 2021-01-10 NOTE — Progress Notes (Signed)
[place]   [phone] [phone]   Clinic Follow up Note   Patient Care Team: Lajean Manes, MD as PCP - [place], [place], MD as Consulting Physician (General Surgery) Truitt Merle, MD as Consulting Physician (Hematology) [name], MD as Consulting Physician (Radiation Oncology) Sylvan Cheese, NP as Nurse Practitioner (Hematology and Oncology) Karen Kays, NP as Nurse Practitioner (Nurse Practitioner)  Date of Service:  01/13/2021  CHIEF COMPLAINT: F/uinvasive lobular carcinoma of the left breast  SUMMARY OF ONCOLOGIC HISTORY: Oncology History Overview Note  Cancer of central portion of left female breast [facility])   Staging form: Breast, AJCC 7th Edition     Pathologic stage from 08/04/2010: Stage IIB (T2, N1a, cM0) - Signed by Truitt Merle, MD on 12/09/2015     Cancer of central portion of left female breast ([place])  08/03/2010 Mammogram    left nipple retraction, and the palpable mass subareolar left breast at the 6:00 position. Ultrasound confirmed the presence of a mass measuring 2.6 x 1.7 x 2.4 cm   08/04/2010 Initial Biopsy   left breast mass biopsy showed an invasive mammary carcinoma with lobular features. ER 95%, PR 97%, Ki-67 17%, HER-2/neu (-)   08/04/2010 Pathologic Stage   Stage IIB: T2 N1a   08/11/2010 Imaging   left breast mass 4.6 x 4.2 x 2.3 cm with enhancement distortion extending to the nipple retraction. There is questionable anterior mediastinal lymph node seen   07/2010 - 09/03/2010 Anti-estrogen oral therapy   neoadjuvant letrozole    09/03/2010 Surgery    left breast mastectomy with sentinel node biopsy on 09/03/2010.   08/2010 - 01/2015 Anti-estrogen oral therapy   Tamoxifen, pt stopped on her own    12/03/2010 - 01/23/2011 Radiation Therapy   left chest wall and axilla radiaiton    10/19/2015 Progression   Left chest wall recurrence, s/p resection on 11/25/2015 with positive margins (ILC)     12/10/2015 - 05/31/2017 Anti-estrogen oral therapy   anastrozole 81m daily, which was switched to Tamoxifen on 06/30/2016 due to persistent disease.   01/03/2016 - 03/03/2016 Radiation Therapy   Radiation to left chest wall recurrence (12/06/2015-02/04/2016 - 44 Gy), pt progressed through the radiation, and had additional 10 fractions of radiation (02/21/16-03/03/2016 - total 70 Gy to the left chest wall)   07/14/2016 PET scan   PET 07/14/2016 IMPRESSION: 1. No findings of recurrent malignancy. 2. Radiation fibrosis in the lingula. A small left axillary lymph node is not hypermetabolic and only 6 mm in diameter. This is in the vicinity of the prior axillary cyst. 3. Pelvic floor laxity.   07/17/2016 Surgery   Chest wall soft tissue resection for local recurrence   07/17/2016 Pathology Results   Left chest wall two soft tissue resection both showed invasive lobular carcinoma, margins were positive. ER 95% positive, PR 60% positive, HER-2 negative.   07/27/2016 Imaging   CT chest, abdomen and pelvis wo contrast  07/27/2016 IMPRESSION: Sigmoid diverticulosis without acute diverticulitis. No bowel obstruction or acute inflammation. Degenerative disc disease and facet arthropathy of the lower lumbar spine with slight grade 1 anterolisthesis of L4 on L5. Stable appearing simple left-sided renal cysts.   03/01/2017 Mammogram   Mammogram 03/01/2017 IMPRESSION: No mammographic evidence of malignancy. A result letter of this screening mammogram will be mailed directly to the patient. RECOMMENDATION: Screening mammogram in one year   04/10/2017 Imaging   PET IMPRESSION: 1. Stable exam.  No findings of recurrent malignancy. 2. Changes of  external beam radiation again noted within the lingula. 3. Small left axillary lymph node exhibits mild increased uptake. Unchanged from previous exam.   06/07/2017 -  Anti-estrogen oral therapy   Stopped Tamoxifen and Began monthly Faslodex IM injection on 06/07/17  -Held  after 09/26/19 due to lost f/u. Restart with loading dose on 04/29/20.    09/19/2017 Imaging   CT CAP WO Contrast 09/19/17 IMPRESSION: 1. Surgical changes from a left mastectomy without CT findings to suggest recurrent chest wall tumor or axillary lymphadenopathy. 2. No findings to suggest metastatic disease involving the neck, chest, abdomen or pelvis or osseous structures. 3. Stable radiation changes involving the lingula.    09/19/2017 Imaging   Bone Scan Whole Body  IMPRESSION: No definite scintigraphic evidence of osseous metastatic disease. Scattered degenerative type uptake as above.   03/11/2018 PET scan   IMPRESSION:  1. No evidence of recurrent disease. 2.  Aortic atherosclerosis    09/03/2018 PET scan   09/03/2018 PET Scan IMPRESSION: 1. No findings to suggest locally recurrent disease or metastatic disease in the neck, chest, abdomen or pelvis. 2. Hepatic steatosis. 3. Colonic diverticulosis without evidence of acute diverticulitis at this time. 4. Aortic atherosclerosis. 5. Additional incidental findings, as above.   11/25/2018 Imaging   DG Ribs Unilateral W/Chest Left  IMPRESSION: Stable left lingular scarring. Normal left ribs. No acute cardiopulmonary abnormality seen.    04/04/2019 PET scan   PET  IMPRESSION: 1. No evidence of hypermetabolic recurrent or metastatic disease. 2. Hepatic steatosis. 3.  Aortic Atherosclerosis (ICD10-I70.0).   05/18/2020 PET scan   IMPRESSION: 1. Findings of LEFT mastectomy and axillary dissection with radiation changes, no signs of metastatic disease or disease recurrence at this time. 2. Hepatic steatosis. 3. Aortic atherosclerosis.   Aortic Atherosclerosis (ICD10-I70.0).     11/09/2020 PET scan   IMPRESSION: 1. No findings of hypermetabolic metastatic disease in the neck, chest, abdomen or pelvis. 2. Hepatic steatosis. 3. Aortic atherosclerosis. 4. Increased metabolism about the sphincter complex and lower  rectum more likely physiologic; however, consider digital rectal exam and correlation with any symptoms in this location.   11/09/2020 PET scan   IMPRESSION: 1. No findings of hypermetabolic metastatic disease in the neck, chest, abdomen or pelvis. 2. Hepatic steatosis. 3. Aortic atherosclerosis. 4. Increased metabolism about the sphincter complex and lower rectum more likely physiologic; however, consider digital rectal exam and correlation with any symptoms in this location.      CURRENT THERAPY:  Monthly Faslodex injection started on 06/07/17. Held after 09/26/19 due to lost f/u. Restart with loading dose(every 2 weeks for 3 injections)on 04/29/20.  INTERVAL HISTORY:  YUMNA EBERS is here for a follow up of left breast cancer. She was last seen by me 5 months ago and she saw NP lacie 2 months ago. She presents to the clinic with a female friend.  She is doing well overall She is concerned about a mole on her scalp, no bleeding  She has mild chest pain, she takes tylenol as needed   She had three fall when she was doing gardening, no injury  She will get a life alert tomorrow  She lives alone and able to function well at home, her daughter [name] comes over 2-3 times a week. Her friend is very concerned about her memory loss All other systems were reviewed with the patient and are negative.  MEDICAL HISTORY:  Past Medical History:  Diagnosis Date  . Anxiety   . Blood in urine   .  CAD (coronary artery disease)    non obstructive by cath  . Cancer [facility])    breast- left  . Depression   . Family history of breast cancer   . Family history of colon cancer   . Family history of ovarian cancer   . Family history of pancreatic cancer   . Family history of prostate cancer   . GERD (gastroesophageal reflux disease)   . Hypertension   . Knee fracture   . Personal history of radiation therapy 2017  . TR (tricuspid regurgitation)    mild by Echo 12/2008 EF >55%    SURGICAL  HISTORY: Past Surgical History:  Procedure Laterality Date  . ABDOMINAL AORTAGRAM N/A 09/08/2011   Procedure: ABDOMINAL Maxcine Ham;  Surgeon: Troy Sine, MD;  Location: [facility] CATH LAB;  Service: Cardiovascular;  Laterality: N/A;  . ABDOMINAL HYSTERECTOMY    . BREAST EXCISIONAL BIOPSY Left 2017   X3  . BREAST SURGERY  08-30-10   mastectomy  . CARDIAC CATHETERIZATION  08/2011   20% LAD stenosis, 20% diagonal stenosis, 10-20% proximal dominant RCA stenosis  . COLON SURGERY    . LEFT HEART CATHETERIZATION WITH CORONARY ANGIOGRAM N/A 09/08/2011   Procedure: LEFT HEART CATHETERIZATION WITH CORONARY ANGIOGRAM;  Surgeon: Troy Sine, MD;  Location: [facility] CATH LAB;  Service: Cardiovascular;  Laterality: N/A;  . MASTECTOMY    . MINOR BREAST BIOPSY Left 11/25/2015   Procedure: MINOR EXCISION MASS LEFT CHEST WALL;  Surgeon: Autumn Messing III, MD;  Location: [place];  Service: General;  Laterality: Left;  . MINOR BREAST BIOPSY Left 07/17/2016   Procedure: EXCISION OF 2 LEFT CHEST WALL MASSES;  Surgeon: Autumn Messing III, MD;  Location: [place];  Service: General;  Laterality: Left;  . TONSILLECTOMY      I have reviewed the social history and family history with the patient and they are unchanged from previous note.  ALLERGIES:  is allergic to contrast media [iodinated diagnostic agents], penicillins, adhesive [tape], cephalexin, ciprofloxacin, demerol, meperidine hcl, sulfa antibiotics, sulfamethoxazole-trimethoprim, and quinolones.  MEDICATIONS:  Current Outpatient Medications  Medication Sig Dispense Refill  . acetaminophen (TYLENOL) 500 MG tablet Take 1,000 mg by mouth daily as needed for moderate pain. Reported on 03/10/2016    . ALPRAZolam (XANAX) 0.25 MG tablet TAKE 1/2 (ONE-HALF) TABLET BY MOUTH IN THE MORNING AND 1 AT BEDTIME AS NEEDED 30 tablet 0  . aspirin 81 MG chewable tablet Chew 81 mg by mouth once a week. Reported on 03/10/2016    . benzonatate (TESSALON) 100  MG capsule Take 100 mg by mouth 3 (three) times daily.    Marland Kitchen CALCIUM-MAGNESIUM-VITAMIN D PO Take 1 tablet by mouth 2 (two) times daily.    Marland Kitchen doxepin (SINEQUAN) 10 MG capsule Take 10 mg by mouth at bedtime.    Marland Kitchen doxycycline (VIBRA-TABS) 100 MG tablet Take 100 mg by mouth 2 (two) times daily.    Marland Kitchen FLOVENT HFA 44 MCG/ACT inhaler Inhale into the lungs.    . fluticasone (FLONASE) 50 MCG/ACT nasal spray Place 1 spray into both nostrils daily as needed for allergies.     Marland Kitchen guaiFENesin (MUCINEX) 600 MG 12 hr tablet Take 600 mg by mouth daily as needed for cough or to loosen phlegm. Reported on 03/28/2016    . hydrochlorothiazide (MICROZIDE) 12.5 MG capsule Take 1 capsule by mouth once daily 90 capsule 0  . losartan (COZAAR) 25 MG tablet 25 mg at bedtime.     Marland Kitchen losartan (COZAAR)  50 MG tablet Take 1 tablet by mouth once daily 90 tablet 1  . nitroGLYCERIN (NITROSTAT) 0.4 MG SL tablet Place 1 tablet (0.4 mg total) under the tongue every 5 (five) minutes as needed for chest pain. <PLEASE MAKE APPOINTMENT> 25 tablet 3  . omeprazole (PRILOSEC) 20 MG capsule omeprazole 20 mg capsule,delayed release    . pseudoephedrine-acetaminophen (TYLENOL SINUS) 30-500 MG TABS tablet Take 1 tablet by mouth every 4 (four) hours as needed.    . senna (SENOKOT) 8.6 MG tablet Take 1 tablet by mouth every other day.    . triamcinolone (KENALOG) 0.025 % cream Apply 1 application topically 2 (two) times daily. 30 g 0   No current facility-administered medications for this visit.    PHYSICAL EXAMINATION: ECOG PERFORMANCE STATUS: 2 - Symptomatic, <50% confined to bed  Vitals:   01/13/21 1333  BP: 123/73  Pulse: 66  Resp: 19  Temp: (!) 97.3 F (36.3 C)  SpO2: 100%   Filed Weights   01/13/21 1333  Weight: 130 lb 14.4 oz (59.4 kg)    GENERAL:alert, no distress and comfortable SKIN: skin color, texture, turgor are normal, no rashes or significant lesions EYES: normal, Conjunctiva are pink and non-injected, sclera  clear NECK: supple, thyroid normal size, non-tender, without nodularity LYMPH:  no palpable lymphadenopathy in the cervical, axillary  LUNGS: clear to auscultation and percussion with normal breathing effort HEART: regular rate & rhythm and no murmurs and no lower extremity edema ABDOMEN:abdomen soft, non-tender and normal bowel sounds Musculoskeletal:no cyanosis of digits and no clubbing, (+) tenderness at mid spine  NEURO: alert & oriented x 3 with fluent speech, no focal motor/sensory deficits Breasts: Breast inspection showed status post left mastectomy.  Stable subcutaneous nodules along the left mastectomy incision site, along with surrounding soft tissue fullness. palpation of the right breast and axilla revealed no obvious mass that I could appreciate. (+) mild tenderness in the sternum and left chest wall   LABORATORY DATA:  I have reviewed the data as listed CBC Latest Ref Rng & Units 01/13/2021 11/09/2020 08/19/2020  WBC 4.0 - 10.5 K/uL 4.4 3.3(L) 4.6  Hemoglobin 12.0 - 15.0 g/dL 12.5 13.5 11.9(L)  Hematocrit 36.0 - 46.0 % 37.9 41.2 36.5  Platelets 150 - 400 K/uL 151 168 156     CMP Latest Ref Rng & Units 11/09/2020 08/19/2020 05/27/2020  Glucose 70 - 99 mg/dL 99 97 87  BUN 8 - 23 mg/dL _0 Creatinine 0.44 - 1.00 mg/dL 1.01(H) 0.90 0.96  Sodium 135 - 145 mmol/L 142 141 144  Potassium 3.5 - 5.1 mmol/L 3.9 3.8 3.7  Chloride 98 - 111 mmol/L 107 106 108  CO2 22 - 32 mmol/L _1 Calcium 8.9 - 10.3 mg/dL 9.6 9.6 9.9  Total Protein 6.5 - 8.1 g/dL 6.4(L) 6.6 6.3(L)  Total Bilirubin 0.3 - 1.2 mg/dL 0.8 0.3 0.4  Alkaline Phos 38 - 126 U/L 84 85 76  AST 15 - 41 U/L _2 ALT 0 - 44 U/L _3 RADIOGRAPHIC STUDIES: I have personally reviewed the radiological images as listed and agreed with the findings in the report. No results found.   ASSESSMENT & PLAN:  Gloria [name] is a 87 y.o. female with   1. Cancer of the central portion of left female breast,  Stage IIB, T2 N1 invasive lobular carcinoma, grade 2, ER 95%, PR 97%, Ki-67 17%, HER-2/neu no amplification, diagnosed in 07/2010,  local chest wall recurrence in 09/2015  -She wasdiagnosed in 10/2011but hadchest wall local recurrence in 09/2015.She was on adjuvant tamoxifen for 4.5 years, stopped on her own anddeveloped local recurrence 8 months after she stopped tamoxifen. -She has completed chest wall radiation, however developedlocal recurrence again andunderwent a second chest wall surgery for two soft tissue resection in 06/2016. Unfortunately the surgicalmargins were positive. Dr. Reggie Pile not offer more surgerybecause complete surgical resection wasunlikely. Pt agreed.  -She has been onmonthlyfulvestrant injectionstarting8/9/18due to clinical suspicion for disease progress.Due to lost follow up and miscommunications she was offtreatment11/27/20-04/29/20.  --Sitting tolerating fulvestrant injection well, exam showed stable tiny left chest wall nodule along the mastectomy site, no other clinical suspicion for disease progression.  Will continue every 4 weeks.  -Patient asked about distance for her fulvestrant injection, she has high co-pay.  I checked with our financial advocate Langley Gauss, her grant has expired, and no other additional financial assistance with this. -I spoke with patient's daughter [name] today, she will check her medical bills.  She would like her to continue fulvestrant injections even if she has to pay high co-pay. -f/u in 2 month. Plan to repeat PET scan in 4 month.  Because she is on fulvestrant injection, [place] PET is not an option   2.Left chest wall pain  -Secondary to breast surgery and chest wall recurrence  -Due to dizziness she is no longer on Cymbalta. -She will continue Tylenol as needed  3. HTN, CAD  -She'll continue follow-up with her primary care physician and cardiologist  4. Osteoporosis  -Her last DEXA scan in 06/2012 showed  osteoporosis  -I previously encouraged her to continue calcium and Vit D -I previously encouraged her to repeat DEXA at her PCP office   5. Goal of care discussion  -The patient understands the goal of care is palliative. -she is full code for now    Plan -Lab reviewed, will proceed fulvestrant injection today and continue every 4 weeks -I called spoke with patient's daughter [name] about her cost issue, she would like to her to continue fulvestrant injection.  I spoke with the financial advocate Lennise today, she does not have any financial assistance available. -Lab and F/u in 8 weeks, will order scan on next visit     No problem-specific Assessment & Plan notes found for this encounter.   No orders of the defined types were placed in this encounter.  All questions were answered. The patient knows to call the clinic with any problems, questions or concerns. No barriers to learning was detected. The total time spent in the appointment was 30 minutes.     Truitt Merle, MD 01/13/2021   I, Joslyn Devon, am acting as scribe for Truitt Merle, MD.   I have reviewed the above documentation for accuracy and completeness, and I agree with the above.

## 2021-01-13 ENCOUNTER — Inpatient Hospital Stay: Payer: PPO | Admitting: Hematology

## 2021-01-13 ENCOUNTER — Encounter: Payer: Self-pay | Admitting: Hematology

## 2021-01-13 ENCOUNTER — Other Ambulatory Visit: Payer: Self-pay

## 2021-01-13 ENCOUNTER — Inpatient Hospital Stay: Payer: PPO

## 2021-01-13 ENCOUNTER — Inpatient Hospital Stay: Payer: PPO | Attending: Hematology

## 2021-01-13 VITALS — BP 123/73 | HR 66 | Temp 97.3°F | Resp 19 | Ht 61.0 in | Wt 130.9 lb

## 2021-01-13 DIAGNOSIS — R0789 Other chest pain: Secondary | ICD-10-CM | POA: Diagnosis not present

## 2021-01-13 DIAGNOSIS — M810 Age-related osteoporosis without current pathological fracture: Secondary | ICD-10-CM | POA: Diagnosis not present

## 2021-01-13 DIAGNOSIS — Z170 Estrogen receptor positive status [ER+]: Secondary | ICD-10-CM

## 2021-01-13 DIAGNOSIS — Z800 Family history of malignant neoplasm of digestive organs: Secondary | ICD-10-CM | POA: Insufficient documentation

## 2021-01-13 DIAGNOSIS — C50112 Malignant neoplasm of central portion of left female breast: Secondary | ICD-10-CM

## 2021-01-13 DIAGNOSIS — Z923 Personal history of irradiation: Secondary | ICD-10-CM | POA: Insufficient documentation

## 2021-01-13 DIAGNOSIS — K760 Fatty (change of) liver, not elsewhere classified: Secondary | ICD-10-CM | POA: Diagnosis not present

## 2021-01-13 DIAGNOSIS — Z8042 Family history of malignant neoplasm of prostate: Secondary | ICD-10-CM | POA: Insufficient documentation

## 2021-01-13 DIAGNOSIS — M4316 Spondylolisthesis, lumbar region: Secondary | ICD-10-CM | POA: Insufficient documentation

## 2021-01-13 DIAGNOSIS — M47819 Spondylosis without myelopathy or radiculopathy, site unspecified: Secondary | ICD-10-CM | POA: Diagnosis not present

## 2021-01-13 DIAGNOSIS — I2510 Atherosclerotic heart disease of native coronary artery without angina pectoris: Secondary | ICD-10-CM | POA: Diagnosis not present

## 2021-01-13 DIAGNOSIS — Z79899 Other long term (current) drug therapy: Secondary | ICD-10-CM | POA: Diagnosis not present

## 2021-01-13 DIAGNOSIS — Z79818 Long term (current) use of other agents affecting estrogen receptors and estrogen levels: Secondary | ICD-10-CM | POA: Insufficient documentation

## 2021-01-13 DIAGNOSIS — Z9012 Acquired absence of left breast and nipple: Secondary | ICD-10-CM | POA: Diagnosis not present

## 2021-01-13 DIAGNOSIS — I700 Atherosclerosis of aorta: Secondary | ICD-10-CM | POA: Diagnosis not present

## 2021-01-13 DIAGNOSIS — I10 Essential (primary) hypertension: Secondary | ICD-10-CM | POA: Insufficient documentation

## 2021-01-13 DIAGNOSIS — K219 Gastro-esophageal reflux disease without esophagitis: Secondary | ICD-10-CM | POA: Diagnosis not present

## 2021-01-13 DIAGNOSIS — Z7982 Long term (current) use of aspirin: Secondary | ICD-10-CM | POA: Insufficient documentation

## 2021-01-13 DIAGNOSIS — Z8041 Family history of malignant neoplasm of ovary: Secondary | ICD-10-CM | POA: Diagnosis not present

## 2021-01-13 DIAGNOSIS — M5136 Other intervertebral disc degeneration, lumbar region: Secondary | ICD-10-CM | POA: Insufficient documentation

## 2021-01-13 DIAGNOSIS — Z803 Family history of malignant neoplasm of breast: Secondary | ICD-10-CM | POA: Insufficient documentation

## 2021-01-13 LAB — CBC WITH DIFFERENTIAL (CANCER CENTER ONLY)
Abs Immature Granulocytes: 0.01 10*3/uL (ref 0.00–0.07)
Basophils Absolute: 0.1 10*3/uL (ref 0.0–0.1)
Basophils Relative: 1 %
Eosinophils Absolute: 0.4 10*3/uL (ref 0.0–0.5)
Eosinophils Relative: 8 %
HCT: 37.9 % (ref 36.0–46.0)
Hemoglobin: 12.5 g/dL (ref 12.0–15.0)
Immature Granulocytes: 0 %
Lymphocytes Relative: 15 %
Lymphs Abs: 0.7 10*3/uL (ref 0.7–4.0)
MCH: 31.6 pg (ref 26.0–34.0)
MCHC: 33 g/dL (ref 30.0–36.0)
MCV: 95.7 fL (ref 80.0–100.0)
Monocytes Absolute: 0.5 10*3/uL (ref 0.1–1.0)
Monocytes Relative: 11 %
Neutro Abs: 2.8 10*3/uL (ref 1.7–7.7)
Neutrophils Relative %: 65 %
Platelet Count: 151 10*3/uL (ref 150–400)
RBC: 3.96 MIL/uL (ref 3.87–5.11)
RDW: 12.7 % (ref 11.5–15.5)
WBC Count: 4.4 10*3/uL (ref 4.0–10.5)
nRBC: 0 % (ref 0.0–0.2)

## 2021-01-13 LAB — CMP (CANCER CENTER ONLY)
ALT: 15 U/L (ref 0–44)
AST: 21 U/L (ref 15–41)
Albumin: 3.9 g/dL (ref 3.5–5.0)
Alkaline Phosphatase: 76 U/L (ref 38–126)
Anion gap: 10 (ref 5–15)
BUN: 23 mg/dL (ref 8–23)
CO2: 27 mmol/L (ref 22–32)
Calcium: 9.4 mg/dL (ref 8.9–10.3)
Chloride: 103 mmol/L (ref 98–111)
Creatinine: 1.01 mg/dL — ABNORMAL HIGH (ref 0.44–1.00)
GFR, Estimated: 53 mL/min — ABNORMAL LOW (ref >60)
Glucose, Bld: 72 mg/dL (ref 70–99)
Potassium: 3.9 mmol/L (ref 3.5–5.1)
Sodium: 140 mmol/L (ref 135–145)
Total Bilirubin: 0.6 mg/dL (ref 0.3–1.2)
Total Protein: 6.5 g/dL (ref 6.5–8.1)

## 2021-01-13 MED ORDER — FULVESTRANT 250 MG/5ML IM SOLN
500.0000 mg | INTRAMUSCULAR | Status: DC
  Administered 2021-01-13: 500 mg via INTRAMUSCULAR

## 2021-01-13 MED ORDER — FULVESTRANT 250 MG/5ML IM SOLN
INTRAMUSCULAR | Status: AC
  Filled 2021-01-13: qty 10

## 2021-01-13 NOTE — Patient Instructions (Signed)
Fulvestrant injection What is this medicine? FULVESTRANT (ful VES trant) blocks the effects of estrogen. It is used to treat breast cancer. This medicine may be used for other purposes; ask your health care provider or pharmacist if you have questions. COMMON BRAND NAME(S): FASLODEX What should I tell my health care provider before I take this medicine? They need to know if you have any of these conditions:  bleeding disorders  liver disease  low blood counts, like low white cell, platelet, or red cell counts  an unusual or allergic reaction to fulvestrant, other medicines, foods, dyes, or preservatives  pregnant or trying to get pregnant  breast-feeding How should I use this medicine? This medicine is for injection into a muscle. It is usually given by a health care professional in a hospital or clinic setting. Talk to your pediatrician regarding the use of this medicine in children. Special care may be needed. Overdosage: If you think you have taken too much of this medicine contact a poison control center or emergency room at once. NOTE: This medicine is only for you. Do not share this medicine with others. What if I miss a dose? It is important not to miss your dose. Call your doctor or health care professional if you are unable to keep an appointment. What may interact with this medicine?  medicines that treat or prevent blood clots like warfarin, enoxaparin, dalteparin, apixaban, dabigatran, and rivaroxaban This list may not describe all possible interactions. Give your health care provider a list of all the medicines, herbs, non-prescription drugs, or dietary supplements you use. Also tell them if you smoke, drink alcohol, or use illegal drugs. Some items may interact with your medicine. What should I watch for while using this medicine? Your condition will be monitored carefully while you are receiving this medicine. You will need important blood work done while you are taking  this medicine. Do not become pregnant while taking this medicine or for at least 1 year after stopping it. Women of child-bearing potential will need to have a negative pregnancy test before starting this medicine. Women should inform their doctor if they wish to become pregnant or think they might be pregnant. There is a potential for serious side effects to an unborn child. Men should inform their doctors if they wish to father a child. This medicine may lower sperm counts. Talk to your health care professional or pharmacist for more information. Do not breast-feed an infant while taking this medicine or for 1 year after the last dose. What side effects may I notice from receiving this medicine? Side effects that you should report to your doctor or health care professional as soon as possible:  allergic reactions like skin rash, itching or hives, swelling of the face, lips, or tongue  feeling faint or lightheaded, falls  pain, tingling, numbness, or weakness in the legs  signs and symptoms of infection like fever or chills; cough; flu-like symptoms; sore throat  vaginal bleeding Side effects that usually do not require medical attention (report to your doctor or health care professional if they continue or are bothersome):  aches, pains  constipation  diarrhea  headache  hot flashes  nausea, vomiting  pain at site where injected  stomach pain This list may not describe all possible side effects. Call your doctor for medical advice about side effects. You may report side effects to FDA at 1-800-FDA-1088. Where should I keep my medicine? This drug is given in a hospital or clinic and will   not be stored at home. NOTE: This sheet is a summary. It may not cover all possible information. If you have questions about this medicine, talk to your doctor, pharmacist, or health care provider.  2021 Elsevier/Gold Standard (2018-01-24 11:34:41)  

## 2021-01-14 LAB — CANCER ANTIGEN 27.29: CA 27.29: 83.2 U/mL — ABNORMAL HIGH (ref 0.0–38.6)

## 2021-01-15 ENCOUNTER — Other Ambulatory Visit: Payer: Self-pay | Admitting: Adult Health

## 2021-01-17 ENCOUNTER — Telehealth: Payer: Self-pay | Admitting: Hematology

## 2021-01-17 NOTE — Telephone Encounter (Signed)
Scheduled follow-up appointment per 3/17 los. Patient is aware. ?

## 2021-01-19 ENCOUNTER — Other Ambulatory Visit: Payer: Self-pay | Admitting: Adult Health

## 2021-01-19 ENCOUNTER — Telehealth: Payer: Self-pay | Admitting: Adult Health

## 2021-01-19 MED ORDER — HYDROCHLOROTHIAZIDE 12.5 MG PO CAPS: 12.5000 mg | ORAL_CAPSULE | Freq: Every day | ORAL | 3 refills | Status: DC

## 2021-01-19 NOTE — Telephone Encounter (Signed)
Pt c/o medication issue:  1. Name of Medication:  hydrochlorothiazide (MICROZIDE) 12.5 MG capsule [[id number]]    2. How are you currently taking this medication (dosage and times per day)?   Take 1 capsule by mouth once daily 3. Are you having a reaction (difficulty breathing--STAT)? Na   4. What is your medication issue? Pt called in and stated this med has been recalled. She is due now for a refill. She would like to know what she needs to do about this med   [phone]- best number

## 2021-01-19 NOTE — Telephone Encounter (Signed)
Spoke with [facility] and she can get her refill they are not using recalled HCTZ. It is only the combo med with Quinapril.

## 2021-02-10 ENCOUNTER — Inpatient Hospital Stay: Payer: PPO | Attending: Hematology

## 2021-02-10 ENCOUNTER — Other Ambulatory Visit: Payer: Self-pay

## 2021-02-10 VITALS — BP 119/76 | HR 85 | Temp 98.3°F | Resp 18

## 2021-02-10 DIAGNOSIS — Z79818 Long term (current) use of other agents affecting estrogen receptors and estrogen levels: Secondary | ICD-10-CM | POA: Diagnosis not present

## 2021-02-10 DIAGNOSIS — Z170 Estrogen receptor positive status [ER+]: Secondary | ICD-10-CM | POA: Insufficient documentation

## 2021-02-10 DIAGNOSIS — C50112 Malignant neoplasm of central portion of left female breast: Secondary | ICD-10-CM | POA: Insufficient documentation

## 2021-02-10 MED ORDER — FULVESTRANT 250 MG/5ML IM SOLN
500.0000 mg | INTRAMUSCULAR | Status: DC
  Administered 2021-02-10: 500 mg via INTRAMUSCULAR

## 2021-02-10 MED ORDER — FULVESTRANT 250 MG/5ML IM SOLN
INTRAMUSCULAR | Status: AC
  Filled 2021-02-10: qty 10

## 2021-02-10 NOTE — Patient Instructions (Signed)
Fulvestrant injection What is this medicine? FULVESTRANT (ful VES trant) blocks the effects of estrogen. It is used to treat breast cancer. This medicine may be used for other purposes; ask your health care provider or pharmacist if you have questions. COMMON BRAND NAME(S): FASLODEX What should I tell my health care provider before I take this medicine? They need to know if you have any of these conditions:  bleeding disorders  liver disease  low blood counts, like low white cell, platelet, or red cell counts  an unusual or allergic reaction to fulvestrant, other medicines, foods, dyes, or preservatives  pregnant or trying to get pregnant  breast-feeding How should I use this medicine? This medicine is for injection into a muscle. It is usually given by a health care professional in a hospital or clinic setting. Talk to your pediatrician regarding the use of this medicine in children. Special care may be needed. Overdosage: If you think you have taken too much of this medicine contact a poison control center or emergency room at once. NOTE: This medicine is only for you. Do not share this medicine with others. What if I miss a dose? It is important not to miss your dose. Call your doctor or health care professional if you are unable to keep an appointment. What may interact with this medicine?  medicines that treat or prevent blood clots like warfarin, enoxaparin, dalteparin, apixaban, dabigatran, and rivaroxaban This list may not describe all possible interactions. Give your health care provider a list of all the medicines, herbs, non-prescription drugs, or dietary supplements you use. Also tell them if you smoke, drink alcohol, or use illegal drugs. Some items may interact with your medicine. What should I watch for while using this medicine? Your condition will be monitored carefully while you are receiving this medicine. You will need important blood work done while you are taking  this medicine. Do not become pregnant while taking this medicine or for at least 1 year after stopping it. Women of child-bearing potential will need to have a negative pregnancy test before starting this medicine. Women should inform their doctor if they wish to become pregnant or think they might be pregnant. There is a potential for serious side effects to an unborn child. Men should inform their doctors if they wish to father a child. This medicine may lower sperm counts. Talk to your health care professional or pharmacist for more information. Do not breast-feed an infant while taking this medicine or for 1 year after the last dose. What side effects may I notice from receiving this medicine? Side effects that you should report to your doctor or health care professional as soon as possible:  allergic reactions like skin rash, itching or hives, swelling of the face, lips, or tongue  feeling faint or lightheaded, falls  pain, tingling, numbness, or weakness in the legs  signs and symptoms of infection like fever or chills; cough; flu-like symptoms; sore throat  vaginal bleeding Side effects that usually do not require medical attention (report to your doctor or health care professional if they continue or are bothersome):  aches, pains  constipation  diarrhea  headache  hot flashes  nausea, vomiting  pain at site where injected  stomach pain This list may not describe all possible side effects. Call your doctor for medical advice about side effects. You may report side effects to FDA at 1-800-FDA-1088. Where should I keep my medicine? This drug is given in a hospital or clinic and will   not be stored at home. NOTE: This sheet is a summary. It may not cover all possible information. If you have questions about this medicine, talk to your doctor, pharmacist, or health care provider.  2021 Elsevier/Gold Standard (2018-01-24 11:34:41)  

## 2021-02-16 ENCOUNTER — Telehealth: Payer: Self-pay

## 2021-02-16 NOTE — Telephone Encounter (Signed)
Gloria [name] called stating she had her shots on 4/14 and she thinks she had a reaction to them last pm.  She said she had back pain late last night.  During the night she states "my mouthfelt funny, but it wasn't swollen".  She denies any other symptoms.  In the am she stated that the swelling was gone from her legs and the veins in her hands were smaller.  She states she may have taken extra htn medication.  I offered an appt for evaluation she declined.  I recommended that she increase her fluid intake, change positions slowly incase she gets dizzy.

## 2021-03-01 DIAGNOSIS — M170 Bilateral primary osteoarthritis of knee: Secondary | ICD-10-CM | POA: Diagnosis not present

## 2021-03-01 DIAGNOSIS — M7062 Trochanteric bursitis, left hip: Secondary | ICD-10-CM | POA: Diagnosis not present

## 2021-03-01 DIAGNOSIS — M1712 Unilateral primary osteoarthritis, left knee: Secondary | ICD-10-CM | POA: Diagnosis not present

## 2021-03-01 DIAGNOSIS — M1711 Unilateral primary osteoarthritis, right knee: Secondary | ICD-10-CM | POA: Diagnosis not present

## 2021-03-02 DIAGNOSIS — N3020 Other chronic cystitis without hematuria: Secondary | ICD-10-CM | POA: Diagnosis not present

## 2021-03-04 DIAGNOSIS — N3020 Other chronic cystitis without hematuria: Secondary | ICD-10-CM | POA: Diagnosis not present

## 2021-03-08 DIAGNOSIS — R0989 Other specified symptoms and signs involving the circulatory and respiratory systems: Secondary | ICD-10-CM | POA: Diagnosis not present

## 2021-03-08 DIAGNOSIS — J3489 Other specified disorders of nose and nasal sinuses: Secondary | ICD-10-CM | POA: Diagnosis not present

## 2021-03-08 DIAGNOSIS — H9113 Presbycusis, bilateral: Secondary | ICD-10-CM | POA: Diagnosis not present

## 2021-03-08 DIAGNOSIS — J320 Chronic maxillary sinusitis: Secondary | ICD-10-CM | POA: Diagnosis not present

## 2021-03-08 DIAGNOSIS — Z974 Presence of external hearing-aid: Secondary | ICD-10-CM | POA: Diagnosis not present

## 2021-03-08 DIAGNOSIS — J321 Chronic frontal sinusitis: Secondary | ICD-10-CM | POA: Diagnosis not present

## 2021-03-08 DIAGNOSIS — K219 Gastro-esophageal reflux disease without esophagitis: Secondary | ICD-10-CM | POA: Diagnosis not present

## 2021-03-08 DIAGNOSIS — J0110 Acute frontal sinusitis, unspecified: Secondary | ICD-10-CM | POA: Diagnosis not present

## 2021-03-08 DIAGNOSIS — H6123 Impacted cerumen, bilateral: Secondary | ICD-10-CM | POA: Diagnosis not present

## 2021-03-10 ENCOUNTER — Inpatient Hospital Stay: Payer: PPO

## 2021-03-10 ENCOUNTER — Other Ambulatory Visit: Payer: Self-pay

## 2021-03-10 ENCOUNTER — Inpatient Hospital Stay: Payer: PPO | Attending: Hematology

## 2021-03-10 ENCOUNTER — Inpatient Hospital Stay: Payer: PPO | Admitting: Hematology

## 2021-03-10 ENCOUNTER — Encounter: Payer: Self-pay | Admitting: Hematology

## 2021-03-10 VITALS — BP 128/71 | HR 67 | Temp 97.6°F | Resp 18 | Ht 61.0 in | Wt 127.9 lb

## 2021-03-10 DIAGNOSIS — I700 Atherosclerosis of aorta: Secondary | ICD-10-CM | POA: Insufficient documentation

## 2021-03-10 DIAGNOSIS — Z800 Family history of malignant neoplasm of digestive organs: Secondary | ICD-10-CM | POA: Insufficient documentation

## 2021-03-10 DIAGNOSIS — K760 Fatty (change of) liver, not elsewhere classified: Secondary | ICD-10-CM | POA: Diagnosis not present

## 2021-03-10 DIAGNOSIS — Z170 Estrogen receptor positive status [ER+]: Secondary | ICD-10-CM | POA: Insufficient documentation

## 2021-03-10 DIAGNOSIS — C50112 Malignant neoplasm of central portion of left female breast: Secondary | ICD-10-CM | POA: Diagnosis not present

## 2021-03-10 DIAGNOSIS — Z9012 Acquired absence of left breast and nipple: Secondary | ICD-10-CM | POA: Diagnosis not present

## 2021-03-10 DIAGNOSIS — M810 Age-related osteoporosis without current pathological fracture: Secondary | ICD-10-CM | POA: Insufficient documentation

## 2021-03-10 DIAGNOSIS — Z79899 Other long term (current) drug therapy: Secondary | ICD-10-CM | POA: Insufficient documentation

## 2021-03-10 DIAGNOSIS — K5730 Diverticulosis of large intestine without perforation or abscess without bleeding: Secondary | ICD-10-CM | POA: Insufficient documentation

## 2021-03-10 DIAGNOSIS — Z803 Family history of malignant neoplasm of breast: Secondary | ICD-10-CM | POA: Insufficient documentation

## 2021-03-10 DIAGNOSIS — K219 Gastro-esophageal reflux disease without esophagitis: Secondary | ICD-10-CM | POA: Diagnosis not present

## 2021-03-10 DIAGNOSIS — Z923 Personal history of irradiation: Secondary | ICD-10-CM | POA: Insufficient documentation

## 2021-03-10 DIAGNOSIS — Z8042 Family history of malignant neoplasm of prostate: Secondary | ICD-10-CM | POA: Diagnosis not present

## 2021-03-10 DIAGNOSIS — I2510 Atherosclerotic heart disease of native coronary artery without angina pectoris: Secondary | ICD-10-CM | POA: Insufficient documentation

## 2021-03-10 DIAGNOSIS — Z8041 Family history of malignant neoplasm of ovary: Secondary | ICD-10-CM | POA: Insufficient documentation

## 2021-03-10 DIAGNOSIS — Z79818 Long term (current) use of other agents affecting estrogen receptors and estrogen levels: Secondary | ICD-10-CM | POA: Diagnosis not present

## 2021-03-10 DIAGNOSIS — R0789 Other chest pain: Secondary | ICD-10-CM | POA: Diagnosis not present

## 2021-03-10 LAB — CMP (CANCER CENTER ONLY)
ALT: 18 U/L (ref 0–44)
AST: 21 U/L (ref 15–41)
Albumin: 3.7 g/dL (ref 3.5–5.0)
Alkaline Phosphatase: 81 U/L (ref 38–126)
Anion gap: 9 (ref 5–15)
BUN: 24 mg/dL — ABNORMAL HIGH (ref 8–23)
CO2: 27 mmol/L (ref 22–32)
Calcium: 9.1 mg/dL (ref 8.9–10.3)
Chloride: 103 mmol/L (ref 98–111)
Creatinine: 1.04 mg/dL — ABNORMAL HIGH (ref 0.44–1.00)
GFR, Estimated: 51 mL/min — ABNORMAL LOW (ref >60)
Glucose, Bld: 70 mg/dL (ref 70–99)
Potassium: 4.3 mmol/L (ref 3.5–5.1)
Sodium: 139 mmol/L (ref 135–145)
Total Bilirubin: 0.6 mg/dL (ref 0.3–1.2)
Total Protein: 6.3 g/dL — ABNORMAL LOW (ref 6.5–8.1)

## 2021-03-10 LAB — CBC WITH DIFFERENTIAL (CANCER CENTER ONLY)
Abs Immature Granulocytes: 0.05 10*3/uL (ref 0.00–0.07)
Basophils Absolute: 0 10*3/uL (ref 0.0–0.1)
Basophils Relative: 0 %
Eosinophils Absolute: 0.3 10*3/uL (ref 0.0–0.5)
Eosinophils Relative: 5 %
HCT: 38.1 % (ref 36.0–46.0)
Hemoglobin: 12.5 g/dL (ref 12.0–15.0)
Immature Granulocytes: 1 %
Lymphocytes Relative: 15 %
Lymphs Abs: 0.9 10*3/uL (ref 0.7–4.0)
MCH: 31.7 pg (ref 26.0–34.0)
MCHC: 32.8 g/dL (ref 30.0–36.0)
MCV: 96.7 fL (ref 80.0–100.0)
Monocytes Absolute: 0.6 10*3/uL (ref 0.1–1.0)
Monocytes Relative: 11 %
Neutro Abs: 3.8 10*3/uL (ref 1.7–7.7)
Neutrophils Relative %: 68 %
Platelet Count: 185 10*3/uL (ref 150–400)
RBC: 3.94 MIL/uL (ref 3.87–5.11)
RDW: 12.6 % (ref 11.5–15.5)
WBC Count: 5.5 10*3/uL (ref 4.0–10.5)
nRBC: 0 % (ref 0.0–0.2)

## 2021-03-10 MED ORDER — FULVESTRANT 250 MG/5ML IM SOLN
500.0000 mg | INTRAMUSCULAR | Status: DC
  Administered 2021-03-10: 500 mg via INTRAMUSCULAR

## 2021-03-10 NOTE — Patient Instructions (Signed)
Fulvestrant injection What is this medicine? FULVESTRANT (ful VES trant) blocks the effects of estrogen. It is used to treat breast cancer. This medicine may be used for other purposes; ask your health care provider or pharmacist if you have questions. COMMON BRAND NAME(S): FASLODEX What should I tell my health care provider before I take this medicine? They need to know if you have any of these conditions:  bleeding disorders  liver disease  low blood counts, like low white cell, platelet, or red cell counts  an unusual or allergic reaction to fulvestrant, other medicines, foods, dyes, or preservatives  pregnant or trying to get pregnant  breast-feeding How should I use this medicine? This medicine is for injection into a muscle. It is usually given by a health care professional in a hospital or clinic setting. Talk to your pediatrician regarding the use of this medicine in children. Special care may be needed. Overdosage: If you think you have taken too much of this medicine contact a poison control center or emergency room at once. NOTE: This medicine is only for you. Do not share this medicine with others. What if I miss a dose? It is important not to miss your dose. Call your doctor or health care professional if you are unable to keep an appointment. What may interact with this medicine?  medicines that treat or prevent blood clots like warfarin, enoxaparin, dalteparin, apixaban, dabigatran, and rivaroxaban This list may not describe all possible interactions. Give your health care provider a list of all the medicines, herbs, non-prescription drugs, or dietary supplements you use. Also tell them if you smoke, drink alcohol, or use illegal drugs. Some items may interact with your medicine. What should I watch for while using this medicine? Your condition will be monitored carefully while you are receiving this medicine. You will need important blood work done while you are taking  this medicine. Do not become pregnant while taking this medicine or for at least 1 year after stopping it. Women of child-bearing potential will need to have a negative pregnancy test before starting this medicine. Women should inform their doctor if they wish to become pregnant or think they might be pregnant. There is a potential for serious side effects to an unborn child. Men should inform their doctors if they wish to father a child. This medicine may lower sperm counts. Talk to your health care professional or pharmacist for more information. Do not breast-feed an infant while taking this medicine or for 1 year after the last dose. What side effects may I notice from receiving this medicine? Side effects that you should report to your doctor or health care professional as soon as possible:  allergic reactions like skin rash, itching or hives, swelling of the face, lips, or tongue  feeling faint or lightheaded, falls  pain, tingling, numbness, or weakness in the legs  signs and symptoms of infection like fever or chills; cough; flu-like symptoms; sore throat  vaginal bleeding Side effects that usually do not require medical attention (report to your doctor or health care professional if they continue or are bothersome):  aches, pains  constipation  diarrhea  headache  hot flashes  nausea, vomiting  pain at site where injected  stomach pain This list may not describe all possible side effects. Call your doctor for medical advice about side effects. You may report side effects to FDA at 1-800-FDA-1088. Where should I keep my medicine? This drug is given in a hospital or clinic and will   not be stored at home. NOTE: This sheet is a summary. It may not cover all possible information. If you have questions about this medicine, talk to your doctor, pharmacist, or health care provider.  2021 Elsevier/Gold Standard (2018-01-24 11:34:41)  

## 2021-03-10 NOTE — Progress Notes (Signed)
Gloria [name]   [phone] [phone]   Clinic Follow up Note   Patient Care Team: Lajean Manes, MD as PCP - [place], [place], MD as Consulting Physician (General Surgery) Truitt Merle, MD as Consulting Physician (Hematology) [name], MD as Consulting Physician (Radiation Oncology) Sylvan Cheese, NP as Nurse Practitioner (Hematology and Oncology) Karen Kays, NP as Nurse Practitioner (Nurse Practitioner)  Date of Service:  03/10/2021  CHIEF COMPLAINT: f/u of invasive lobular carcinoma of the left breast  SUMMARY OF ONCOLOGIC HISTORY: Oncology History Overview Note  Cancer of central portion of left female breast [facility])   Staging form: Breast, AJCC 7th Edition     Pathologic stage from 08/04/2010: Stage IIB (T2, N1a, cM0) - Signed by Truitt Merle, MD on 12/09/2015     Cancer of central portion of left female breast ([place])  08/03/2010 Mammogram    left nipple retraction, and the palpable mass subareolar left breast at the 6:00 position. Ultrasound confirmed the presence of a mass measuring 2.6 x 1.7 x 2.4 cm   08/04/2010 Initial Biopsy   left breast mass biopsy showed an invasive mammary carcinoma with lobular features. ER 95%, PR 97%, Ki-67 17%, HER-2/neu (-)   08/04/2010 Pathologic Stage   Stage IIB: T2 N1a   08/11/2010 Imaging   left breast mass 4.6 x 4.2 x 2.3 cm with enhancement distortion extending to the nipple retraction. There is questionable anterior mediastinal lymph node seen   07/2010 - 09/03/2010 Anti-estrogen oral therapy   neoadjuvant letrozole    09/03/2010 Surgery    left breast mastectomy with sentinel node biopsy on 09/03/2010.   08/2010 - 01/2015 Anti-estrogen oral therapy   Tamoxifen, pt stopped on her own    12/03/2010 - 01/23/2011 Radiation Therapy   left chest wall and axilla radiaiton    10/19/2015 Progression   Left chest wall recurrence, s/p resection on 11/25/2015 with positive margins (ILC)     12/10/2015 - 05/31/2017 Anti-estrogen oral therapy   anastrozole 33m daily, which was switched to Tamoxifen on 06/30/2016 due to persistent disease.   01/03/2016 - 03/03/2016 Radiation Therapy   Radiation to left chest wall recurrence (12/06/2015-02/04/2016 - 44 Gy), pt progressed through the radiation, and had additional 10 fractions of radiation (02/21/16-03/03/2016 - total 70 Gy to the left chest wall)   07/14/2016 PET scan   PET 07/14/2016 IMPRESSION: 1. No findings of recurrent malignancy. 2. Radiation fibrosis in the lingula. A small left axillary lymph node is not hypermetabolic and only 6 mm in diameter. This is in the vicinity of the prior axillary cyst. 3. Pelvic floor laxity.   07/17/2016 Surgery   Chest wall soft tissue resection for local recurrence   07/17/2016 Pathology Results   Left chest wall two soft tissue resection both showed invasive lobular carcinoma, margins were positive. ER 95% positive, PR 60% positive, HER-2 negative.   07/27/2016 Imaging   CT chest, abdomen and pelvis wo contrast  07/27/2016 IMPRESSION: Sigmoid diverticulosis without acute diverticulitis. No bowel obstruction or acute inflammation. Degenerative disc disease and facet arthropathy of the lower lumbar spine with slight grade 1 anterolisthesis of L4 on L5. Stable appearing simple left-sided renal cysts.   03/01/2017 Mammogram   Mammogram 03/01/2017 IMPRESSION: No mammographic evidence of malignancy. A result letter of this screening mammogram will be mailed directly to the patient. RECOMMENDATION: Screening mammogram in one year   04/10/2017 Imaging   PET IMPRESSION: 1. Stable exam.  No findings of recurrent malignancy. 2.  Changes of external beam radiation again noted within the lingula. 3. Small left axillary lymph node exhibits mild increased uptake. Unchanged from previous exam.   06/07/2017 -  Anti-estrogen oral therapy   Stopped Tamoxifen and Began monthly Faslodex IM injection on 06/07/17  -Held  after 09/26/19 due to lost f/u. Restart with loading dose on 04/29/20.    09/19/2017 Imaging   CT CAP WO Contrast 09/19/17 IMPRESSION: 1. Surgical changes from a left mastectomy without CT findings to suggest recurrent chest wall tumor or axillary lymphadenopathy. 2. No findings to suggest metastatic disease involving the neck, chest, abdomen or pelvis or osseous structures. 3. Stable radiation changes involving the lingula.    09/19/2017 Imaging   Bone Scan Whole Body  IMPRESSION: No definite scintigraphic evidence of osseous metastatic disease. Scattered degenerative type uptake as above.   03/11/2018 PET scan   IMPRESSION:  1. No evidence of recurrent disease. 2.  Aortic atherosclerosis    09/03/2018 PET scan   09/03/2018 PET Scan IMPRESSION: 1. No findings to suggest locally recurrent disease or metastatic disease in the neck, chest, abdomen or pelvis. 2. Hepatic steatosis. 3. Colonic diverticulosis without evidence of acute diverticulitis at this time. 4. Aortic atherosclerosis. 5. Additional incidental findings, as above.   11/25/2018 Imaging   DG Ribs Unilateral W/Chest Left  IMPRESSION: Stable left lingular scarring. Normal left ribs. No acute cardiopulmonary abnormality seen.    04/04/2019 PET scan   PET  IMPRESSION: 1. No evidence of hypermetabolic recurrent or metastatic disease. 2. Hepatic steatosis. 3.  Aortic Atherosclerosis (ICD10-I70.0).   05/18/2020 PET scan   IMPRESSION: 1. Findings of LEFT mastectomy and axillary dissection with radiation changes, no signs of metastatic disease or disease recurrence at this time. 2. Hepatic steatosis. 3. Aortic atherosclerosis.   Aortic Atherosclerosis (ICD10-I70.0).     11/09/2020 PET scan   IMPRESSION: 1. No findings of hypermetabolic metastatic disease in the neck, chest, abdomen or pelvis. 2. Hepatic steatosis. 3. Aortic atherosclerosis. 4. Increased metabolism about the sphincter complex and lower  rectum more likely physiologic; however, consider digital rectal exam and correlation with any symptoms in this location.   11/09/2020 PET scan   IMPRESSION: 1. No findings of hypermetabolic metastatic disease in the neck, chest, abdomen or pelvis. 2. Hepatic steatosis. 3. Aortic atherosclerosis. 4. Increased metabolism about the sphincter complex and lower rectum more likely physiologic; however, consider digital rectal exam and correlation with any symptoms in this location.      CURRENT THERAPY:  Monthly Faslodex injection started on 06/07/17. Held after 09/26/19 due to lost f/u. Restart with loading dose(every 2 weeks for 3 injections)on 04/29/20.  INTERVAL HISTORY:  PAULLETTE MCKAIN is here for a follow up of left breast cancer. She was last seen by me on 01/13/21. She presents to the clinic accompanied by her daughter [name]. She notes she has continued chest pain that comes and goes. She notes it is tolerable, but it wakes her up. She notes she is currently being treated for a UTI, she had cortisone injections in her knees, and she saw Dr. Constance Holster for a deviated septum secondary to a fall. She notes the UTI was found by her urologist. She also reports headaches every day on the right side of her head. Her daughter notes Sherrol stopped her allergy medicine (Mucinex and Allegra or Zyrtec) because of developing bronchitis. She continues using her Flovent inhaler, which helps.  All other systems were reviewed with the patient and are negative.  MEDICAL HISTORY:  Past  Medical History:  Diagnosis Date  . Anxiety   . Blood in urine   . CAD (coronary artery disease)    non obstructive by cath  . Cancer [facility])    breast- left  . Depression   . Family history of breast cancer   . Family history of colon cancer   . Family history of ovarian cancer   . Family history of pancreatic cancer   . Family history of prostate cancer   . GERD (gastroesophageal reflux disease)   . Hypertension   .  Knee fracture   . Personal history of radiation therapy 2017  . TR (tricuspid regurgitation)    mild by Echo 12/2008 EF >55%    SURGICAL HISTORY: Past Surgical History:  Procedure Laterality Date  . ABDOMINAL AORTAGRAM N/A 09/08/2011   Procedure: ABDOMINAL Maxcine Ham;  Surgeon: Troy Sine, MD;  Location[facility] C[facility] LAB;  Service: Cardiovascular;  Laterality: N/A;  . ABDOMINAL HYSTERECTOMY    . BREAST EXCISIONAL BIOPSY Left 2017   X3  . BREAST SURGERY  08-30-10   mastectomy  . CARDIAC CATHETERIZATION  08/2011   20% LAD stenosis, 20% diagonal stenosis, 10-20% proximal dominant RCA stenosis  . COLON SURGERY    . LEFT HEART CATHETERIZATION WITH CORONARY ANGIOGRAM N/A 09/08/2011   Procedure: LEFT HEART CATHETERIZATION WITH CORONARY ANGIOGRAM;  Surgeon: Troy Sine, MD;  Location: [facility] CATH LAB;  Service: Cardiovascular;  Laterality: N/A;  . MASTECTOMY    . MINOR BREAST BIOPSY Left 11/25/2015   Procedure: MINOR EXCISION MASS LEFT CHEST WALL;  Surgeon: Autumn Messing III, MD;  Location: [place];  Service: General;  Laterality: Left;  . MINOR BREAST BIOPSY Left 07/17/2016   Procedure: EXCISION OF 2 LEFT CHEST WALL MASSES;  Surgeon: Autumn Messing III, MD;  Location: [place];  Service: General;  Laterality: Left;  . TONSILLECTOMY      I have reviewed the social history and family history with the patient and they are unchanged from previous note.  ALLERGIES:  is allergic to contrast media [iodinated diagnostic agents], penicillins, adhesive [tape], cephalexin, ciprofloxacin, demerol, meperidine hcl, sulfa antibiotics, sulfamethoxazole-trimethoprim, and quinolones.  MEDICATIONS:  Current Outpatient Medications  Medication Sig Dispense Refill  . acetaminophen (TYLENOL) 500 MG tablet Take 1,000 mg by mouth daily as needed for moderate pain. Reported on 03/10/2016    . ALPRAZolam (XANAX) 0.25 MG tablet TAKE 1/2 (ONE-HALF) TABLET BY MOUTH IN THE MORNING AND 1 AT BEDTIME AS  NEEDED 30 tablet 0  . aspirin 81 MG chewable tablet Chew 81 mg by mouth once a week. Reported on 03/10/2016    . benzonatate (TESSALON) 100 MG capsule Take 100 mg by mouth 3 (three) times daily.    Marland Kitchen CALCIUM-MAGNESIUM-VITAMIN D PO Take 1 tablet by mouth 2 (two) times daily.    Marland Kitchen doxepin (SINEQUAN) 10 MG capsule Take 10 mg by mouth at bedtime.    Marland Kitchen doxycycline (VIBRA-TABS) 100 MG tablet Take 100 mg by mouth 2 (two) times daily.    Marland Kitchen FLOVENT HFA 44 MCG/ACT inhaler Inhale into the lungs.    . fluticasone (FLONASE) 50 MCG/ACT nasal spray Place 1 spray into both nostrils daily as needed for allergies.     Marland Kitchen guaiFENesin (MUCINEX) 600 MG 12 hr tablet Take 600 mg by mouth daily as needed for cough or to loosen phlegm. Reported on 03/28/2016    . hydrochlorothiazide (MICROZIDE) 12.5 MG capsule Take 1 capsule (12.5 mg total) by mouth daily. 90 capsule  3  . losartan (COZAAR) 25 MG tablet 25 mg at bedtime.     Marland Kitchen losartan (COZAAR) 50 MG tablet Take 1 tablet by mouth once daily 90 tablet 0  . nitroGLYCERIN (NITROSTAT) 0.4 MG SL tablet Place 1 tablet (0.4 mg total) under the tongue every 5 (five) minutes as needed for chest pain. <PLEASE MAKE APPOINTMENT> 25 tablet 3  . omeprazole (PRILOSEC) 20 MG capsule omeprazole 20 mg capsule,delayed release    . pseudoephedrine-acetaminophen (TYLENOL SINUS) 30-500 MG TABS tablet Take 1 tablet by mouth every 4 (four) hours as needed.    . senna (SENOKOT) 8.6 MG tablet Take 1 tablet by mouth every other day.    . triamcinolone (KENALOG) 0.025 % cream Apply 1 application topically 2 (two) times daily. 30 g 0   No current facility-administered medications for this visit.    PHYSICAL EXAMINATION: ECOG PERFORMANCE STATUS: 2 - Symptomatic, <50% confined to bed  Vitals:   03/10/21 1151  BP: 128/71  Pulse: 67  Resp: 18  Temp: 97.6 F (36.4 C)  SpO2: 99%   Filed Weights   03/10/21 1151  Weight: 127 lb 14.4 oz (58 kg)    GENERAL:alert, no distress and  comfortable SKIN: skin color, texture, turgor are normal, no rashes or significant lesions EYES: normal, Conjunctiva are pink and non-injected, sclera clear  NECK: supple, thyroid normal size, non-tender, without nodularity LYMPH:  no palpable lymphadenopathy in the cervical, axillary  LUNGS: clear to auscultation and percussion with normal breathing effort HEART: regular rate & rhythm and no murmurs and no lower extremity edema ABDOMEN:abdomen soft, non-tender and normal bowel sounds Musculoskeletal:no cyanosis of digits and no clubbing  NEURO: alert & oriented x 3 with fluent speech, no focal motor/sensory deficits BREASTS: tenderness to left chest wall; continued nodularity along mastectomy site, unchanged from prior; right breast normal  LABORATORY DATA:  I have reviewed the data as listed CBC Latest Ref Rng & Units 03/10/2021 01/13/2021 11/09/2020  WBC 4.0 - 10.5 K/uL 5.5 4.4 3.3(L)  Hemoglobin 12.0 - 15.0 g/dL 12.5 12.5 13.5  Hematocrit 36.0 - 46.0 % 38.1 37.9 41.2  Platelets 150 - 400 K/uL 185 151 168     CMP Latest Ref Rng & Units 03/10/2021 01/13/2021 11/09/2020  Glucose 70 - 99 mg/dL 70 72 99  BUN 8 - 23 mg/dL 24(H) 23 14  Creatinine 0.44 - 1.00 mg/dL 1.04(H) 1.01(H) 1.01(H)  Sodium 135 - 145 mmol/L 139 140 142  Potassium 3.5 - 5.1 mmol/L 4.3 3.9 3.9  Chloride 98 - 111 mmol/L 103 103 107  CO2 22 - 32 mmol/L 27 27 28   Calcium 8.9 - 10.3 mg/dL 9.1 9.4 9.6  Total Protein 6.5 - 8.1 g/dL 6.3(L) 6.5 6.4(L)  Total Bilirubin 0.3 - 1.2 mg/dL 0.6 0.6 0.8  Alkaline Phos 38 - 126 U/L 81 76 84  AST 15 - 41 U/L 21 21 24   ALT 0 - 44 U/L 18 15 19       RADIOGRAPHIC STUDIES: I have personally reviewed the radiological images as listed and agreed with the findings in the report. No results found.   ASSESSMENT & PLAN:  Gloria [name] is a 87 y.o. female with   1. Cancer of the central portion of left female breast, Stage IIB, T2 N1 invasive lobular carcinoma, grade 2, ER 95%, PR 97%,  Ki-67 17%, HER-2/neu no amplification, diagnosed in 07/2010, local chest wall recurrence in 09/2015  -She wasdiagnosed in 10/2011but hadchest wall local recurrence in 09/2015.She was on  adjuvant tamoxifen for 4.5 years, stopped on her own anddeveloped local recurrence 8 months after she stopped tamoxifen. -She has completed chest wall radiation, however developedlocal recurrence again andunderwent a second chest wall surgery for two soft tissue resection in 06/2016. Unfortunately the surgicalmargins were positive. Dr. Reggie Pile not offer more surgerybecause complete surgical resection wasunlikely.Pt agreed. -She has been onmonthlyfulvestrant injectionstarting8/9/18due to clinical suspicion for disease progress.Due to lost follow up and miscommunications, she was off treatment 09/26/19-04/29/20. -She is tolerating fulvestrant injection well.  Will continue every 4 weeks.she is on drug assistance program -Plan to repeat PET scan in 2 month.  Because she is on fulvestrant injection, [place] PET is not an option  2.Left chest wall pain  -Secondary to breast surgery and chest wall recurrence  -Due to dizziness she is no longer on Cymbalta. -She will continue Tylenol as needed  3. HTN, CAD  -She'll continue follow-up with her primary care physician and cardiologist  4. Osteoporosis  -Her last DEXA scan in 06/2012 showed osteoporosis  -I previously encouraged her to continue calcium and Vit D -I previously encouraged her to repeat DEXA at her PCP office   5. Goal of care discussion  -The patient understands the goal of care is palliative. -she is full code for now   Plan -Lab reviewed,will proceed fulvestrant injection today and continue every 4 weeks -Lab and F/u in 8 weeks, with PET scan prior to visit   No problem-specific Assessment & Plan notes found for this encounter.   Orders Placed This Encounter  Procedures  . NM PET Image Restag (PS) Skull Base To  Thigh    Standing Status:   Future    Standing Expiration Date:   03/10/2022    Order Specific Question:   If indicated for the ordered procedure, I authorize the administration of a radiopharmaceutical per Radiology protocol    Answer:   Yes    Order Specific Question:   Preferred imaging location?    Answer:   [facility]   All questions were answered. The patient knows to call the clinic with any problems, questions or concerns. No barriers to learning was detected. The total time spent in the appointment was 30 minutes.     Truitt Merle, MD 03/10/2021   I, Wilburn Mylar, am acting as scribe for Truitt Merle, MD.   I have reviewed the above documentation for accuracy and completeness, and I agree with the above.

## 2021-03-11 ENCOUNTER — Telehealth: Payer: Self-pay | Admitting: Hematology

## 2021-03-11 LAB — CANCER ANTIGEN 27.29: CA 27.29: 135.2 U/mL — ABNORMAL HIGH (ref 0.0–38.6)

## 2021-03-11 NOTE — Telephone Encounter (Signed)
Scheduled follow-up appointments per 5/12 los. Patient is aware. 

## 2021-04-03 DIAGNOSIS — N3001 Acute cystitis with hematuria: Secondary | ICD-10-CM | POA: Diagnosis not present

## 2021-04-03 DIAGNOSIS — K649 Unspecified hemorrhoids: Secondary | ICD-10-CM | POA: Diagnosis not present

## 2021-04-03 DIAGNOSIS — Z792 Long term (current) use of antibiotics: Secondary | ICD-10-CM | POA: Diagnosis not present

## 2021-04-07 ENCOUNTER — Inpatient Hospital Stay: Payer: PPO | Attending: Hematology

## 2021-04-07 ENCOUNTER — Other Ambulatory Visit: Payer: Self-pay

## 2021-04-07 VITALS — BP 131/83 | HR 72 | Temp 97.9°F | Resp 18

## 2021-04-07 DIAGNOSIS — Z79818 Long term (current) use of other agents affecting estrogen receptors and estrogen levels: Secondary | ICD-10-CM | POA: Diagnosis not present

## 2021-04-07 DIAGNOSIS — C50112 Malignant neoplasm of central portion of left female breast: Secondary | ICD-10-CM | POA: Insufficient documentation

## 2021-04-07 DIAGNOSIS — Z170 Estrogen receptor positive status [ER+]: Secondary | ICD-10-CM | POA: Diagnosis not present

## 2021-04-07 MED ORDER — FULVESTRANT 250 MG/5ML IM SOLN
500.0000 mg | INTRAMUSCULAR | Status: DC
  Administered 2021-04-07: 500 mg via INTRAMUSCULAR

## 2021-04-07 MED ORDER — FULVESTRANT 250 MG/5ML IM SOLN
INTRAMUSCULAR | Status: AC
  Filled 2021-04-07: qty 10

## 2021-04-07 NOTE — Patient Instructions (Signed)
Fulvestrant injection What is this medicine? FULVESTRANT (ful VES trant) blocks the effects of estrogen. It is used to treat breast cancer. This medicine may be used for other purposes; ask your health care provider or pharmacist if you have questions. COMMON BRAND NAME(S): FASLODEX What should I tell my health care provider before I take this medicine? They need to know if you have any of these conditions:  bleeding disorders  liver disease  low blood counts, like low white cell, platelet, or red cell counts  an unusual or allergic reaction to fulvestrant, other medicines, foods, dyes, or preservatives  pregnant or trying to get pregnant  breast-feeding How should I use this medicine? This medicine is for injection into a muscle. It is usually given by a health care professional in a hospital or clinic setting. Talk to your pediatrician regarding the use of this medicine in children. Special care may be needed. Overdosage: If you think you have taken too much of this medicine contact a poison control center or emergency room at once. NOTE: This medicine is only for you. Do not share this medicine with others. What if I miss a dose? It is important not to miss your dose. Call your doctor or health care professional if you are unable to keep an appointment. What may interact with this medicine?  medicines that treat or prevent blood clots like warfarin, enoxaparin, dalteparin, apixaban, dabigatran, and rivaroxaban This list may not describe all possible interactions. Give your health care provider a list of all the medicines, herbs, non-prescription drugs, or dietary supplements you use. Also tell them if you smoke, drink alcohol, or use illegal drugs. Some items may interact with your medicine. What should I watch for while using this medicine? Your condition will be monitored carefully while you are receiving this medicine. You will need important blood work done while you are taking  this medicine. Do not become pregnant while taking this medicine or for at least 1 year after stopping it. Women of child-bearing potential will need to have a negative pregnancy test before starting this medicine. Women should inform their doctor if they wish to become pregnant or think they might be pregnant. There is a potential for serious side effects to an unborn child. Men should inform their doctors if they wish to father a child. This medicine may lower sperm counts. Talk to your health care professional or pharmacist for more information. Do not breast-feed an infant while taking this medicine or for 1 year after the last dose. What side effects may I notice from receiving this medicine? Side effects that you should report to your doctor or health care professional as soon as possible:  allergic reactions like skin rash, itching or hives, swelling of the face, lips, or tongue  feeling faint or lightheaded, falls  pain, tingling, numbness, or weakness in the legs  signs and symptoms of infection like fever or chills; cough; flu-like symptoms; sore throat  vaginal bleeding Side effects that usually do not require medical attention (report to your doctor or health care professional if they continue or are bothersome):  aches, pains  constipation  diarrhea  headache  hot flashes  nausea, vomiting  pain at site where injected  stomach pain This list may not describe all possible side effects. Call your doctor for medical advice about side effects. You may report side effects to FDA at 1-800-FDA-1088. Where should I keep my medicine? This drug is given in a hospital or clinic and will   not be stored at home. NOTE: This sheet is a summary. It may not cover all possible information. If you have questions about this medicine, talk to your doctor, pharmacist, or health care provider.  2021 Elsevier/Gold Standard (2018-01-24 11:34:41)  

## 2021-05-03 ENCOUNTER — Other Ambulatory Visit: Payer: Self-pay

## 2021-05-03 ENCOUNTER — Inpatient Hospital Stay: Payer: PPO | Attending: Hematology

## 2021-05-03 ENCOUNTER — Ambulatory Visit (HOSPITAL_COMMUNITY)
Admission: RE | Admit: 2021-05-03 | Discharge: 2021-05-03 | Disposition: A | Discharge status: Home / Self Care | Payer: PPO | Source: Ambulatory Visit | Attending: Hematology | Admitting: Hematology

## 2021-05-03 DIAGNOSIS — C50912 Malignant neoplasm of unspecified site of left female breast: Secondary | ICD-10-CM | POA: Diagnosis not present

## 2021-05-03 DIAGNOSIS — Z170 Estrogen receptor positive status [ER+]: Secondary | ICD-10-CM | POA: Insufficient documentation

## 2021-05-03 DIAGNOSIS — Z8042 Family history of malignant neoplasm of prostate: Secondary | ICD-10-CM | POA: Diagnosis not present

## 2021-05-03 DIAGNOSIS — Z9012 Acquired absence of left breast and nipple: Secondary | ICD-10-CM | POA: Diagnosis not present

## 2021-05-03 DIAGNOSIS — C50112 Malignant neoplasm of central portion of left female breast: Secondary | ICD-10-CM | POA: Insufficient documentation

## 2021-05-03 DIAGNOSIS — R978 Other abnormal tumor markers: Secondary | ICD-10-CM | POA: Diagnosis not present

## 2021-05-03 DIAGNOSIS — R0789 Other chest pain: Secondary | ICD-10-CM | POA: Diagnosis not present

## 2021-05-03 DIAGNOSIS — Z923 Personal history of irradiation: Secondary | ICD-10-CM | POA: Diagnosis not present

## 2021-05-03 DIAGNOSIS — K5730 Diverticulosis of large intestine without perforation or abscess without bleeding: Secondary | ICD-10-CM | POA: Insufficient documentation

## 2021-05-03 DIAGNOSIS — K760 Fatty (change of) liver, not elsewhere classified: Secondary | ICD-10-CM | POA: Insufficient documentation

## 2021-05-03 DIAGNOSIS — I2510 Atherosclerotic heart disease of native coronary artery without angina pectoris: Secondary | ICD-10-CM | POA: Diagnosis not present

## 2021-05-03 DIAGNOSIS — Z79818 Long term (current) use of other agents affecting estrogen receptors and estrogen levels: Secondary | ICD-10-CM | POA: Insufficient documentation

## 2021-05-03 DIAGNOSIS — Z79899 Other long term (current) drug therapy: Secondary | ICD-10-CM | POA: Diagnosis not present

## 2021-05-03 DIAGNOSIS — I10 Essential (primary) hypertension: Secondary | ICD-10-CM | POA: Diagnosis not present

## 2021-05-03 DIAGNOSIS — K219 Gastro-esophageal reflux disease without esophagitis: Secondary | ICD-10-CM | POA: Diagnosis not present

## 2021-05-03 DIAGNOSIS — Z8041 Family history of malignant neoplasm of ovary: Secondary | ICD-10-CM | POA: Insufficient documentation

## 2021-05-03 DIAGNOSIS — Z800 Family history of malignant neoplasm of digestive organs: Secondary | ICD-10-CM | POA: Diagnosis not present

## 2021-05-03 DIAGNOSIS — Z803 Family history of malignant neoplasm of breast: Secondary | ICD-10-CM | POA: Diagnosis not present

## 2021-05-03 DIAGNOSIS — M810 Age-related osteoporosis without current pathological fracture: Secondary | ICD-10-CM | POA: Diagnosis not present

## 2021-05-03 DIAGNOSIS — I700 Atherosclerosis of aorta: Secondary | ICD-10-CM | POA: Insufficient documentation

## 2021-05-03 DIAGNOSIS — F419 Anxiety disorder, unspecified: Secondary | ICD-10-CM | POA: Insufficient documentation

## 2021-05-03 LAB — CBC WITH DIFFERENTIAL (CANCER CENTER ONLY)
Abs Immature Granulocytes: 0.01 10*3/uL (ref 0.00–0.07)
Basophils Absolute: 0 10*3/uL (ref 0.0–0.1)
Basophils Relative: 1 %
Eosinophils Absolute: 0.3 10*3/uL (ref 0.0–0.5)
Eosinophils Relative: 7 %
HCT: 35.9 % — ABNORMAL LOW (ref 36.0–46.0)
Hemoglobin: 11.9 g/dL — ABNORMAL LOW (ref 12.0–15.0)
Immature Granulocytes: 0 %
Lymphocytes Relative: 20 %
Lymphs Abs: 0.8 10*3/uL (ref 0.7–4.0)
MCH: 32 pg (ref 26.0–34.0)
MCHC: 33.1 g/dL (ref 30.0–36.0)
MCV: 96.5 fL (ref 80.0–100.0)
Monocytes Absolute: 0.4 10*3/uL (ref 0.1–1.0)
Monocytes Relative: 9 %
Neutro Abs: 2.7 10*3/uL (ref 1.7–7.7)
Neutrophils Relative %: 63 %
Platelet Count: 139 10*3/uL — ABNORMAL LOW (ref 150–400)
RBC: 3.72 MIL/uL — ABNORMAL LOW (ref 3.87–5.11)
RDW: 12.9 % (ref 11.5–15.5)
WBC Count: 4.2 10*3/uL (ref 4.0–10.5)
nRBC: 0 % (ref 0.0–0.2)

## 2021-05-03 LAB — CMP (CANCER CENTER ONLY)
ALT: 12 U/L (ref 0–44)
AST: 19 U/L (ref 15–41)
Albumin: 3.8 g/dL (ref 3.5–5.0)
Alkaline Phosphatase: 77 U/L (ref 38–126)
Anion gap: 8 (ref 5–15)
BUN: 19 mg/dL (ref 8–23)
CO2: 28 mmol/L (ref 22–32)
Calcium: 9.6 mg/dL (ref 8.9–10.3)
Chloride: 106 mmol/L (ref 98–111)
Creatinine: 0.85 mg/dL (ref 0.44–1.00)
GFR, Estimated: >60 mL/min (ref >60)
Glucose, Bld: 89 mg/dL (ref 70–99)
Potassium: 4 mmol/L (ref 3.5–5.1)
Sodium: 142 mmol/L (ref 135–145)
Total Bilirubin: 0.6 mg/dL (ref 0.3–1.2)
Total Protein: 6.5 g/dL (ref 6.5–8.1)

## 2021-05-03 LAB — GLUCOSE, CAPILLARY: Glucose-Capillary: 89 mg/dL (ref 70–99)

## 2021-05-03 MED ORDER — FLUDEOXYGLUCOSE F - 18 (FDG) INJECTION
6.4000 | Freq: Once | INTRAVENOUS | Status: AC
  Administered 2021-05-03: 6.47 via INTRAVENOUS

## 2021-05-04 LAB — CANCER ANTIGEN 27.29: CA 27.29: 158.4 U/mL — ABNORMAL HIGH (ref 0.0–38.6)

## 2021-05-05 ENCOUNTER — Inpatient Hospital Stay: Payer: PPO | Admitting: Hematology

## 2021-05-05 ENCOUNTER — Other Ambulatory Visit: Payer: Self-pay | Admitting: Cardiovascular Disease

## 2021-05-05 ENCOUNTER — Inpatient Hospital Stay: Payer: PPO

## 2021-05-05 ENCOUNTER — Other Ambulatory Visit: Payer: Self-pay

## 2021-05-05 VITALS — BP 131/83 | HR 78 | Temp 97.3°F | Resp 18 | Ht 61.0 in | Wt 131.3 lb

## 2021-05-05 DIAGNOSIS — Z170 Estrogen receptor positive status [ER+]: Secondary | ICD-10-CM

## 2021-05-05 DIAGNOSIS — C50112 Malignant neoplasm of central portion of left female breast: Secondary | ICD-10-CM | POA: Diagnosis not present

## 2021-05-05 MED ORDER — FULVESTRANT 250 MG/5ML IM SOLN
INTRAMUSCULAR | Status: AC
  Filled 2021-05-05: qty 10

## 2021-05-05 MED ORDER — FULVESTRANT 250 MG/5ML IM SOLN
500.0000 mg | Freq: Once | INTRAMUSCULAR | Status: AC
  Administered 2021-05-05: 500 mg via INTRAMUSCULAR
  Filled 2021-05-05: qty 10

## 2021-05-05 NOTE — Patient Instructions (Signed)
Fulvestrant injection What is this medication? FULVESTRANT (ful VES trant) blocks the effects of estrogen. It is used to treat breast cancer. This medicine may be used for other purposes; ask your health care provider or pharmacist if you have questions. COMMON BRAND NAME(S): FASLODEX What should I tell my care team before I take this medication? They need to know if you have any of these conditions: bleeding disorders liver disease low blood counts, like low white cell, platelet, or red cell counts an unusual or allergic reaction to fulvestrant, other medicines, foods, dyes, or preservatives pregnant or trying to get pregnant breast-feeding How should I use this medication? This medicine is for injection into a muscle. It is usually given by a health care professional in a hospital or clinic setting. Talk to your pediatrician regarding the use of this medicine in children. Special care may be needed. Overdosage: If you think you have taken too much of this medicine contact a poison control center or emergency room at once. NOTE: This medicine is only for you. Do not share this medicine with others. What if I miss a dose? It is important not to miss your dose. Call your doctor or health care professional if you are unable to keep an appointment. What may interact with this medication? medicines that treat or prevent blood clots like warfarin, enoxaparin, dalteparin, apixaban, dabigatran, and rivaroxaban This list may not describe all possible interactions. Give your health care provider a list of all the medicines, herbs, non-prescription drugs, or dietary supplements you use. Also tell them if you smoke, drink alcohol, or use illegal drugs. Some items may interact with your medicine. What should I watch for while using this medication? Your condition will be monitored carefully while you are receiving this medicine. You will need important blood work done while you are taking this  medicine. Do not become pregnant while taking this medicine or for at least 1 year after stopping it. Women of child-bearing potential will need to have a negative pregnancy test before starting this medicine. Women should inform their doctor if they wish to become pregnant or think they might be pregnant. There is a potential for serious side effects to an unborn child. Men should inform their doctors if they wish to father a child. This medicine may lower sperm counts. Talk to your health care professional or pharmacist for more information. Do not breast-feed an infant while taking this medicine or for 1 year after the last dose. What side effects may I notice from receiving this medication? Side effects that you should report to your doctor or health care professional as soon as possible: allergic reactions like skin rash, itching or hives, swelling of the face, lips, or tongue feeling faint or lightheaded, falls pain, tingling, numbness, or weakness in the legs signs and symptoms of infection like fever or chills; cough; flu-like symptoms; sore throat vaginal bleeding Side effects that usually do not require medical attention (report to your doctor or health care professional if they continue or are bothersome): aches, pains constipation diarrhea headache hot flashes nausea, vomiting pain at site where injected stomach pain This list may not describe all possible side effects. Call your doctor for medical advice about side effects. You may report side effects to FDA at 1-800-FDA-1088. Where should I keep my medication? This drug is given in a hospital or clinic and will not be stored at home. NOTE: This sheet is a summary. It may not cover all possible information. If you have   questions about this medicine, talk to your doctor, pharmacist, or health care provider.  2022 Elsevier/Gold Standard (2018-01-24 11:34:41)  

## 2021-05-05 NOTE — Progress Notes (Signed)
Gloria [name]   [phone] [phone]   Clinic Follow up Note   Patient Care Team: Lajean Manes, MD as PCP - Hartington, Sebastian, MD as Consulting Physician (General Surgery) Truitt Merle, MD as Consulting Physician (Hematology) [name], MD as Consulting Physician (Radiation Oncology) Sylvan Cheese, NP as Nurse Practitioner (Hematology and Oncology) Karen Kays, NP as Nurse Practitioner (Nurse Practitioner)  Date of Service:  05/05/2021   CHIEF COMPLAINT: f/u of lobular left breast cancer  SUMMARY OF ONCOLOGIC HISTORY: Oncology History Overview Note  Cancer of central portion of left female breast [facility])   Staging form: Breast, AJCC 7th Edition     Pathologic stage from 08/04/2010: Stage IIB (T2, N1a, cM0) - Signed by Truitt Merle, MD on 12/09/2015       Cancer of central portion of left female breast ([place])  08/03/2010 Mammogram    left nipple retraction, and the palpable mass subareolar left breast at the 6:00 position. Ultrasound confirmed the presence of a mass measuring 2.6 x 1.7 x 2.4 cm    08/04/2010 Initial Biopsy   left breast mass biopsy showed an invasive mammary carcinoma with lobular features. ER 95%, PR 97%, Ki-67 17%, HER-2/neu (-)    08/04/2010 Pathologic Stage   Stage IIB: T2 N1a    08/11/2010 Imaging   left breast mass 4.6 x 4.2 x 2.3 cm with enhancement distortion extending to the nipple retraction. There is questionable anterior mediastinal lymph node seen    07/2010 - 09/03/2010 Anti-estrogen oral therapy   neoadjuvant letrozole     09/03/2010 Surgery    left breast mastectomy with sentinel node biopsy on 09/03/2010.    08/2010 - 01/2015 Anti-estrogen oral therapy   Tamoxifen, pt stopped on her own     12/03/2010 - 01/23/2011 Radiation Therapy   left chest wall and axilla radiaiton     10/19/2015 Progression   Left chest wall recurrence, s/p resection on 11/25/2015 with positive margins ([facility])      12/10/2015 - 05/31/2017 Anti-estrogen oral therapy   anastrozole 72m daily, which was switched to Tamoxifen on 06/30/2016 due to persistent disease.   01/03/2016 - 03/03/2016 Radiation Therapy   Radiation to left chest wall recurrence (12/06/2015-02/04/2016 - 44 Gy), pt progressed through the radiation, and had additional 10 fractions of radiation (02/21/16-03/03/2016 - total 70 Gy to the left chest wall)    07/14/2016 PET scan   PET 07/14/2016 IMPRESSION: 1. No findings of recurrent malignancy. 2. Radiation fibrosis in the lingula. A small left axillary lymph node is not hypermetabolic and only 6 mm in diameter. This is in the vicinity of the prior axillary cyst. 3. Pelvic floor laxity.    07/17/2016 Surgery   Chest wall soft tissue resection for local recurrence    07/17/2016 Pathology Results   Left chest wall two soft tissue resection both showed invasive lobular carcinoma, margins were positive. ER 95% positive, PR 60% positive, HER-2 negative.    07/27/2016 Imaging   CT chest, abdomen and pelvis wo contrast  07/27/2016 IMPRESSION: Sigmoid diverticulosis without acute diverticulitis. No bowel obstruction or acute inflammation. Degenerative disc disease and facet arthropathy of the lower lumbar spine with slight grade 1 anterolisthesis of L4 on L5. Stable appearing simple left-sided renal cysts.    03/01/2017 Mammogram   Mammogram 03/01/2017 IMPRESSION: No mammographic evidence of malignancy. A result letter of this screening mammogram will be mailed directly to the patient.  RECOMMENDATION: Screening mammogram in one year  04/10/2017 Imaging   PET IMPRESSION: 1. Stable exam.  No findings of recurrent malignancy. 2. Changes of external beam radiation again noted within the lingula. 3. Small left axillary lymph node exhibits mild increased uptake. Unchanged from previous exam.    06/07/2017 -  Anti-estrogen oral therapy   Stopped Tamoxifen and Began monthly Faslodex IM injection on  06/07/17  -Held after 09/26/19 due to lost f/u. Restart with loading dose on 04/29/20.    09/19/2017 Imaging   CT CAP WO Contrast 09/19/17 IMPRESSION: 1. Surgical changes from a left mastectomy without CT findings to suggest recurrent chest wall tumor or axillary lymphadenopathy. 2. No findings to suggest metastatic disease involving the neck, chest, abdomen or pelvis or osseous structures. 3. Stable radiation changes involving the lingula.    09/19/2017 Imaging   Bone Scan Whole Body  IMPRESSION: No definite scintigraphic evidence of osseous metastatic disease.  Scattered degenerative type uptake as above.    03/11/2018 PET scan   IMPRESSION:  1. No evidence of recurrent disease. 2.  Aortic atherosclerosis     09/03/2018 PET scan   09/03/2018 PET Scan IMPRESSION: 1. No findings to suggest locally recurrent disease or metastatic disease in the neck, chest, abdomen or pelvis. 2. Hepatic steatosis. 3. Colonic diverticulosis without evidence of acute diverticulitis at this time. 4. Aortic atherosclerosis. 5. Additional incidental findings, as above.    11/25/2018 Imaging   DG Ribs Unilateral W/Chest Left  IMPRESSION: Stable left lingular scarring. Normal left ribs. No acute cardiopulmonary abnormality seen.    04/04/2019 PET scan   PET  IMPRESSION: 1. No evidence of hypermetabolic recurrent or metastatic disease. 2. Hepatic steatosis. 3.  Aortic Atherosclerosis (ICD10-I70.0).    05/18/2020 PET scan   IMPRESSION: 1. Findings of LEFT mastectomy and axillary dissection with radiation changes, no signs of metastatic disease or disease recurrence at this time. 2. Hepatic steatosis. 3. Aortic atherosclerosis.   Aortic Atherosclerosis (ICD10-I70.0).     11/09/2020 PET scan   IMPRESSION: 1. No findings of hypermetabolic metastatic disease in the neck, chest, abdomen or pelvis. 2. Hepatic steatosis. 3. Aortic atherosclerosis. 4. Increased metabolism about the sphincter  complex and lower rectum more likely physiologic; however, consider digital rectal exam and correlation with any symptoms in this location.   05/03/2021 PET scan   IMPRESSION: Status post left mastectomy with left axillary lymph node dissection. Radiation changes in the left upper lobe/lingula.   No evidence of recurrent or metastatic disease.      CURRENT THERAPY:  Monthly Faslodex injection started on 06/07/17. Held after 09/26/19 due to lost f/u. Restart with loading dose (every 2 weeks for 3 injections) on 04/29/20.  INTERVAL HISTORY:  Gloria [name] is here for a follow up of breast cancer. She was last seen by me on 03/10/21. She presents to the clinic accompanied by her daughter [name]. She reports pain all over, rated an average of 3/10. She endorses taking tylenol. She reports dryness and skin bumps to her left chest wall. She notes the bumps happened previously. She reports she developed a UTI with hemorrhoids since her last visit.   All other systems were reviewed with the patient and are negative.  MEDICAL HISTORY:  Past Medical History:  Diagnosis Date   Anxiety    Blood in urine    CAD (coronary artery disease)    non obstructive by cath   Cancer [facility])    breast- left   Depression    Family history of breast cancer  Family history of colon cancer    Family history of ovarian cancer    Family history of pancreatic cancer    Family history of prostate cancer    GERD (gastroesophageal reflux disease)    Hypertension    Knee fracture    Personal history of radiation therapy 2017   TR (tricuspid regurgitation)    mild by Echo 12/2008 EF >55%    SURGICAL HISTORY: Past Surgical History:  Procedure Laterality Date   ABDOMINAL AORTAGRAM N/A 09/08/2011   Procedure: ABDOMINAL Maxcine Ham;  Surgeon: Troy Sine, MD;  Location: [facility] CATH LAB;  Service: Cardiovascular;  Laterality: N/A;   ABDOMINAL HYSTERECTOMY     BREAST EXCISIONAL BIOPSY Left 2017   X3   BREAST SURGERY   08-30-10   mastectomy   CARDIAC CATHETERIZATION  08/2011   20% LAD stenosis, 20% diagonal stenosis, 10-20% proximal dominant RCA stenosis   COLON SURGERY     LEFT HEART CATHETERIZATION WITH CORONARY ANGIOGRAM N/A 09/08/2011   Procedure: LEFT HEART CATHETERIZATION WITH CORONARY ANGIOGRAM;  Surgeon: Troy Sine, MD;  Location: [facility] CATH LAB;  Service: Cardiovascular;  Laterality: N/A;   MASTECTOMY     MINOR BREAST BIOPSY Left 11/25/2015   Procedure: MINOR EXCISION MASS LEFT CHEST WALL;  Surgeon: Autumn Messing III, MD;  Location: [place];  Service: General;  Laterality: Left;   MINOR BREAST BIOPSY Left 07/17/2016   Procedure: EXCISION OF 2 LEFT CHEST WALL MASSES;  Surgeon: Autumn Messing III, MD;  Location: [place];  Service: General;  Laterality: Left;   TONSILLECTOMY      I have reviewed the social history and family history with the patient and they are unchanged from previous note.  ALLERGIES:  is allergic to contrast media [iodinated diagnostic agents], penicillins, adhesive [tape], cephalexin, ciprofloxacin, demerol, meperidine hcl, sulfa antibiotics, sulfamethoxazole-trimethoprim, and quinolones.  MEDICATIONS:  Current Outpatient Medications  Medication Sig Dispense Refill   acetaminophen (TYLENOL) 500 MG tablet Take 1,000 mg by mouth daily as needed for moderate pain. Reported on 03/10/2016     ALPRAZolam (XANAX) 0.25 MG tablet TAKE 1/2 (ONE-HALF) TABLET BY MOUTH IN THE MORNING AND 1 AT BEDTIME AS NEEDED 30 tablet 0   aspirin 81 MG chewable tablet Chew 81 mg by mouth once a week. Reported on 03/10/2016     benzonatate (TESSALON) 100 MG capsule Take 100 mg by mouth 3 (three) times daily.     CALCIUM-MAGNESIUM-VITAMIN D PO Take 1 tablet by mouth 2 (two) times daily.     doxepin (SINEQUAN) 10 MG capsule Take 10 mg by mouth at bedtime.     doxycycline (VIBRA-TABS) 100 MG tablet Take 100 mg by mouth 2 (two) times daily.     FLOVENT HFA 44 MCG/ACT inhaler Inhale into  the lungs.     fluticasone (FLONASE) 50 MCG/ACT nasal spray Place 1 spray into both nostrils daily as needed for allergies.      guaiFENesin (MUCINEX) 600 MG 12 hr tablet Take 600 mg by mouth daily as needed for cough or to loosen phlegm. Reported on 03/28/2016     hydrochlorothiazide (MICROZIDE) 12.5 MG capsule Take 1 capsule (12.5 mg total) by mouth daily. 90 capsule 3   losartan (COZAAR) 25 MG tablet 25 mg at bedtime.      losartan (COZAAR) 50 MG tablet Take 1 tablet by mouth once daily 90 tablet 0   nitroGLYCERIN (NITROSTAT) 0.4 MG SL tablet Place 1 tablet (0.4 mg total) under the tongue every 5 (  five) minutes as needed for chest pain. <PLEASE MAKE APPOINTMENT> 25 tablet 3   omeprazole (PRILOSEC) 20 MG capsule omeprazole 20 mg capsule,delayed release     pseudoephedrine-acetaminophen (TYLENOL SINUS) 30-500 MG TABS tablet Take 1 tablet by mouth every 4 (four) hours as needed.     senna (SENOKOT) 8.6 MG tablet Take 1 tablet by mouth every other day.     triamcinolone (KENALOG) 0.025 % cream Apply 1 application topically 2 (two) times daily. 30 g 0   No current facility-administered medications for this visit.    PHYSICAL EXAMINATION: ECOG PERFORMANCE STATUS: 1 - Symptomatic but completely ambulatory  Vitals:   05/05/21 1214  BP: 131/83  Pulse: 78  Resp: 18  Temp: (!) 97.3 F (36.3 C)  SpO2: 96%   Filed Weights   05/05/21 1214  Weight: 131 lb 4.8 oz (59.6 kg)    GENERAL:alert, no distress and comfortable SKIN: skin color, texture, turgor are normal, no rashes or significant lesions EYES: normal, Conjunctiva are pink and non-injected, sclera clear  NECK: supple, thyroid normal size, non-tender, without nodularity LYMPH:  no palpable lymphadenopathy in the cervical, axillary  Musculoskeletal:no cyanosis of digits and no clubbing  NEURO: alert & oriented x 3 with fluent speech, no focal motor/sensory deficits BREAST: tenderness to left breast, small palpable nodule at incision  site is unchanged. No palpable mass or adenopathy bilaterally.   LABORATORY DATA:  I have reviewed the data as listed CBC Latest Ref Rng & Units 05/03/2021 03/10/2021 01/13/2021  WBC 4.0 - 10.5 K/uL 4.2 5.5 4.4  Hemoglobin 12.0 - 15.0 g/dL 11.9(L) 12.5 12.5  Hematocrit 36.0 - 46.0 % 35.9(L) 38.1 37.9  Platelets 150 - 400 K/uL 139(L) 185 151     CMP Latest Ref Rng & Units 05/03/2021 03/10/2021 01/13/2021  Glucose 70 - 99 mg/dL 89 70 72  BUN 8 - 23 mg/dL 19 24(H) 23  Creatinine 0.44 - 1.00 mg/dL 0.85 1.04(H) 1.01(H)  Sodium 135 - 145 mmol/L 142 139 140  Potassium 3.5 - 5.1 mmol/L 4.0 4.3 3.9  Chloride 98 - 111 mmol/L 106 103 103  CO2 22 - 32 mmol/L _0 Calcium 8.9 - 10.3 mg/dL 9.6 9.1 9.4  Total Protein 6.5 - 8.1 g/dL 6.5 6.3(L) 6.5  Total Bilirubin 0.3 - 1.2 mg/dL 0.6 0.6 0.6  Alkaline Phos 38 - 126 U/L 77 81 76  AST 15 - 41 U/L _1 ALT 0 - 44 U/L _2 RADIOGRAPHIC STUDIES: I have personally reviewed the radiological images as listed and agreed with the findings in the report. No results found.   ASSESSMENT & PLAN:  ALEJANDRIA WESSELLS is a 87 y.o. female with   1. Cancer of the central portion of left female breast, Stage IIB, T2 N1 invasive lobular carcinoma, grade 2, ER 95%, PR 97%, Ki-67 17%, HER-2/neu no amplification, diagnosed in 07/2010, local chest wall recurrence in 09/2015   -She was diagnosed in 07/2010 but had chest wall local recurrence in 09/2015. She was on adjuvant tamoxifen for 4.5 years, stopped on her own and developed local recurrence 8 months after she stopped tamoxifen. -She has completed chest wall radiation, however developed local recurrence again and underwent a second chest wall surgery for two soft tissue resection in 06/2016. Unfortunately the surgical margins were positive. Dr. Marlou Starks did not offer more surgery because complete surgical resection was unlikely. Pt agreed.  -She has been on monthly fulvestrant injection starting  06/07/17 due to  clinical suspicion for disease progress. Due to lost follow up and miscommunications, she was off treatment 09/26/19-04/29/20.  -PET 05/03/21 showed no evidence of recurrent or metastatic disease. Given a slow rise in her CA 27.29 tumor marker, and her persistent chest wall pain, I discussed [facility] PET and the benefits of this over a regular PET scan. Assuming her insurance approves it, she is in agreement to proceed. -She is tolerating fulvestrant injection well. She reports the injection is painful and the site will itch for the first few days. Otherwise, it is fine. Will continue every 4 weeks. she is on drug assistance program   2. Left chest wall pain -Secondary to breast surgery and chest wall recurrence  -Due to dizziness she is no longer on Cymbalta. -She will continue Tylenol as needed   3. HTN, CAD   -She'll continue follow-up with her primary care physician and cardiologist   4. Osteoporosis -Her last DEXA scan in 06/2012 showed osteoporosis -I previously encouraged her to continue calcium and Vit D -I previously encouraged her to repeat DEXA at her PCP office   5. Goal of care discussion -The patient understands the goal of care is palliative. -she is full code for now       Plan  -Proceed with fulvestrant injection today and every 4 weeks -I ordered Cer[facility]na PET to be done before next visit  - F/u in 1 month after PET scan   No problem-specific Assessment & Plan notes found for this encounter.   Orders Placed This Encounter  Procedures   NM PET ([place]) WHOLE BODY    Standing Status:   Future    Standing Expiration Date:   05/07/2022    Order Specific Question:   If indicated for the ordered procedure, I authorize the administration of a radiopharmaceutical per Radiology protocol    Answer:   Yes    Order Specific Question:   Preferred imaging location?    Answer:   [facility]    All questions were answered. The patient knows to call the clinic with any problems,  questions or concerns. No barriers to learning was detected. The total time spent in the appointment was 30 minutes.     Truitt Merle, MD 05/07/2021   I, Wilburn Mylar, am acting as scribe for Truitt Merle, MD.   I have reviewed the above documentation for accuracy and completeness, and I agree with the above.

## 2021-05-06 ENCOUNTER — Telehealth: Payer: Self-pay | Admitting: Hematology

## 2021-05-06 NOTE — Telephone Encounter (Signed)
Scheduled follow-up appointment per 7/7 los. Patient is aware. 

## 2021-05-07 ENCOUNTER — Encounter: Payer: Self-pay | Admitting: Hematology

## 2021-06-02 ENCOUNTER — Ambulatory Visit: Payer: PPO

## 2021-06-02 ENCOUNTER — Inpatient Hospital Stay: Payer: PPO | Attending: Hematology

## 2021-06-02 ENCOUNTER — Other Ambulatory Visit: Payer: Self-pay

## 2021-06-02 ENCOUNTER — Ambulatory Visit: Payer: PPO | Admitting: Hematology

## 2021-06-02 ENCOUNTER — Telehealth: Payer: Self-pay

## 2021-06-02 VITALS — BP 131/79 | HR 71 | Temp 97.9°F | Resp 18

## 2021-06-02 DIAGNOSIS — Z79818 Long term (current) use of other agents affecting estrogen receptors and estrogen levels: Secondary | ICD-10-CM | POA: Insufficient documentation

## 2021-06-02 DIAGNOSIS — C50112 Malignant neoplasm of central portion of left female breast: Secondary | ICD-10-CM | POA: Insufficient documentation

## 2021-06-02 DIAGNOSIS — Z170 Estrogen receptor positive status [ER+]: Secondary | ICD-10-CM | POA: Insufficient documentation

## 2021-06-02 MED ORDER — FULVESTRANT 250 MG/5ML IM SOLN
INTRAMUSCULAR | Status: AC
  Filled 2021-06-02: qty 10

## 2021-06-02 MED ORDER — FULVESTRANT 250 MG/5ML IM SOLN
500.0000 mg | INTRAMUSCULAR | Status: DC
  Administered 2021-06-02: 500 mg via INTRAMUSCULAR

## 2021-06-02 NOTE — Telephone Encounter (Signed)
This nurse spoke with patients daughter.  Informed per [name] patient does not have to attend appointment for today.  [name] would like patient to complete

## 2021-06-02 NOTE — Progress Notes (Unsigned)
This nurse called and spoke with patients daughter.  Verified that patient has been feeling well.  Informed per [name] patient does not need to attend office visit appointment today, MD would prefer to wait until patient has completed the PET.  Daughter requested to know is it possible for her to schedule the scan.  This nurse provided the number for central scheduling.  Advised that patient will still need to come for her injection which is scheduled for 1245pm today.  Daughter acknowledged understanding.  No further questions or concerns at this time.  This nurse will schedule follow up 1-2 days following the PET.

## 2021-06-08 ENCOUNTER — Encounter: Payer: Self-pay | Admitting: Hematology

## 2021-06-10 DIAGNOSIS — R269 Unspecified abnormalities of gait and mobility: Secondary | ICD-10-CM | POA: Diagnosis not present

## 2021-06-13 DIAGNOSIS — N3001 Acute cystitis with hematuria: Secondary | ICD-10-CM | POA: Diagnosis not present

## 2021-06-13 DIAGNOSIS — B373 Candidiasis of vulva and vagina: Secondary | ICD-10-CM | POA: Diagnosis not present

## 2021-06-13 DIAGNOSIS — R309 Painful micturition, unspecified: Secondary | ICD-10-CM | POA: Diagnosis not present

## 2021-06-27 ENCOUNTER — Ambulatory Visit (HOSPITAL_COMMUNITY)
Admission: RE | Admit: 2021-06-27 | Discharge: 2021-06-27 | Disposition: A | Discharge status: Home / Self Care | Payer: PPO | Source: Ambulatory Visit | Attending: Hematology | Admitting: Hematology

## 2021-06-27 ENCOUNTER — Other Ambulatory Visit: Payer: Self-pay

## 2021-06-27 DIAGNOSIS — Z170 Estrogen receptor positive status [ER+]: Secondary | ICD-10-CM | POA: Insufficient documentation

## 2021-06-27 DIAGNOSIS — C50919 Malignant neoplasm of unspecified site of unspecified female breast: Secondary | ICD-10-CM | POA: Diagnosis not present

## 2021-06-27 DIAGNOSIS — C50112 Malignant neoplasm of central portion of left female breast: Secondary | ICD-10-CM | POA: Diagnosis not present

## 2021-06-27 MED ORDER — FLUOROESTRADIOL F 18 4-100 MCI/ML IV SOLN
6.0000 | Freq: Once | INTRAVENOUS | Status: AC
  Administered 2021-06-27: 6 via INTRAVENOUS

## 2021-06-29 ENCOUNTER — Telehealth: Payer: Self-pay | Admitting: Hematology

## 2021-06-29 NOTE — Telephone Encounter (Signed)
Scheduled per sch msg. Called and spoke with patient. Confimed appt

## 2021-07-03 DIAGNOSIS — N3000 Acute cystitis without hematuria: Secondary | ICD-10-CM | POA: Diagnosis not present

## 2021-07-03 DIAGNOSIS — R3989 Other symptoms and signs involving the genitourinary system: Secondary | ICD-10-CM | POA: Diagnosis not present

## 2021-07-03 DIAGNOSIS — R300 Dysuria: Secondary | ICD-10-CM | POA: Diagnosis not present

## 2021-07-03 DIAGNOSIS — B373 Candidiasis of vulva and vagina: Secondary | ICD-10-CM | POA: Diagnosis not present

## 2021-07-05 ENCOUNTER — Encounter: Payer: Self-pay | Admitting: Hematology

## 2021-07-05 ENCOUNTER — Inpatient Hospital Stay: Payer: PPO

## 2021-07-05 ENCOUNTER — Inpatient Hospital Stay: Payer: PPO | Attending: Hematology | Admitting: Hematology

## 2021-07-05 ENCOUNTER — Other Ambulatory Visit: Payer: Self-pay

## 2021-07-05 VITALS — BP 138/81 | HR 67 | Temp 98.6°F | Resp 15

## 2021-07-05 VITALS — BP 134/99 | HR 74 | Resp 19 | Ht 61.0 in | Wt 133.8 lb

## 2021-07-05 DIAGNOSIS — R42 Dizziness and giddiness: Secondary | ICD-10-CM | POA: Insufficient documentation

## 2021-07-05 DIAGNOSIS — M810 Age-related osteoporosis without current pathological fracture: Secondary | ICD-10-CM | POA: Insufficient documentation

## 2021-07-05 DIAGNOSIS — Z923 Personal history of irradiation: Secondary | ICD-10-CM | POA: Diagnosis not present

## 2021-07-05 DIAGNOSIS — Z9012 Acquired absence of left breast and nipple: Secondary | ICD-10-CM | POA: Insufficient documentation

## 2021-07-05 DIAGNOSIS — I2510 Atherosclerotic heart disease of native coronary artery without angina pectoris: Secondary | ICD-10-CM | POA: Diagnosis not present

## 2021-07-05 DIAGNOSIS — Z8042 Family history of malignant neoplasm of prostate: Secondary | ICD-10-CM | POA: Diagnosis not present

## 2021-07-05 DIAGNOSIS — R0789 Other chest pain: Secondary | ICD-10-CM | POA: Diagnosis not present

## 2021-07-05 DIAGNOSIS — M5136 Other intervertebral disc degeneration, lumbar region: Secondary | ICD-10-CM | POA: Diagnosis not present

## 2021-07-05 DIAGNOSIS — K760 Fatty (change of) liver, not elsewhere classified: Secondary | ICD-10-CM | POA: Insufficient documentation

## 2021-07-05 DIAGNOSIS — Z79899 Other long term (current) drug therapy: Secondary | ICD-10-CM | POA: Insufficient documentation

## 2021-07-05 DIAGNOSIS — Z79818 Long term (current) use of other agents affecting estrogen receptors and estrogen levels: Secondary | ICD-10-CM | POA: Diagnosis not present

## 2021-07-05 DIAGNOSIS — I10 Essential (primary) hypertension: Secondary | ICD-10-CM | POA: Diagnosis not present

## 2021-07-05 DIAGNOSIS — Z803 Family history of malignant neoplasm of breast: Secondary | ICD-10-CM | POA: Insufficient documentation

## 2021-07-05 DIAGNOSIS — C50112 Malignant neoplasm of central portion of left female breast: Secondary | ICD-10-CM | POA: Insufficient documentation

## 2021-07-05 DIAGNOSIS — Z170 Estrogen receptor positive status [ER+]: Secondary | ICD-10-CM | POA: Diagnosis not present

## 2021-07-05 DIAGNOSIS — K219 Gastro-esophageal reflux disease without esophagitis: Secondary | ICD-10-CM | POA: Diagnosis not present

## 2021-07-05 DIAGNOSIS — I700 Atherosclerosis of aorta: Secondary | ICD-10-CM | POA: Diagnosis not present

## 2021-07-05 DIAGNOSIS — Z8041 Family history of malignant neoplasm of ovary: Secondary | ICD-10-CM | POA: Insufficient documentation

## 2021-07-05 DIAGNOSIS — Z800 Family history of malignant neoplasm of digestive organs: Secondary | ICD-10-CM | POA: Diagnosis not present

## 2021-07-05 MED ORDER — FULVESTRANT 250 MG/5ML IM SOLN: 500.0000 mg | INTRAMUSCULAR | Status: DC

## 2021-07-05 NOTE — Progress Notes (Signed)
[place]   [phone] [phone]) [phone]   Clinic Follow up Note   Patient Care Team: Lajean Manes, MD as PCP - Sartell, Kenton Vale, MD as Consulting Physician (General Surgery) Truitt Merle, MD as Consulting Physician (Hematology) [name], MD as Consulting Physician (Radiation Oncology) Sylvan Cheese, NP as Nurse Practitioner (Hematology and Oncology) Karen Kays, NP as Nurse Practitioner (Nurse Practitioner)  Date of Service:  07/05/2021  CHIEF COMPLAINT: f/u of lobular left breast cancer  CURRENT THERAPY:  Monthly Faslodex injection started on 06/07/17. Held after 09/26/19 due to lost f/u. Restart with loading dose (every 2 weeks for 3 injections) on 04/29/20.  ASSESSMENT & PLAN:  Gloria [name] is a 87 y.o. female with   1. Cancer of the central portion of left female breast, Stage IIB, T2 N1 invasive lobular carcinoma, grade 2, ER 95%, PR 97%, Ki-67 17%, HER-2/neu no amplification, diagnosed in 07/2010, local chest wall recurrence in 09/2015   -She was diagnosed in 07/2010 but had chest wall local recurrence in 09/2015. She was on adjuvant tamoxifen for 4.5 years, stopped on her own and developed local recurrence 8 months after she stopped tamoxifen. -She has completed chest wall radiation, however developed local recurrence again and underwent a second chest wall surgery for two soft tissue resection in 06/2016. Unfortunately the surgical margins were positive. Dr. Marlou Starks did not offer more surgery because complete surgical resection was unlikely. Pt agreed.  -She has been on monthly fulvestrant injection starting 06/07/17 due to clinical suspicion for disease progress. Due to lost follow up and miscommunications, she was off treatment 09/26/19-04/29/20.  -PET 05/03/21 showed no evidence of recurrent or metastatic disease.  -I ordered [place] PET scan to be done. This was performed on 06/27/21, but due to the fact that she is on  fulvestrant injections, the scan was not very informative. I reviewed the results and apologized to the patient. -She is tolerating fulvestrant injection well. She reports the injection is painful and the site will itch for the first few days. Otherwise, it is fine. Will continue every 4 weeks. she is on drug assistance program   2. Left chest wall pain -Secondary to breast surgery and chest wall recurrence  -Due to dizziness she is no longer on Cymbalta. -She will continue Tylenol as needed   3. HTN, CAD   -She'll continue follow-up with her primary care physician and cardiologist   4. Osteoporosis -Her last DEXA scan in 06/2012 showed osteoporosis -I previously encouraged her to continue calcium and Vit D -I previously encouraged her to repeat DEXA at her PCP office   5. Goal of care discussion -The patient understands the goal of care is palliative. -she is full code for now       Plan  -Proceed with fulvestrant injection next week (due to delay of the medication shipment) and continue every 4 weeks  -lab in 9 weeks -f/u in 13 weeks   No problem-specific Assessment & Plan notes found for this encounter.   SUMMARY OF ONCOLOGIC HISTORY: Oncology History Overview Note  Cancer of central portion of left female breast [facility])   Staging form: Breast, AJCC 7th Edition     Pathologic stage from 08/04/2010: Stage IIB (T2, N1a, cM0) - Signed by Truitt Merle, MD on 12/09/2015      Cancer of central portion of left female breast ([place])  08/03/2010 Mammogram    left nipple retraction, and the palpable mass subareolar left breast at the  6:00 position. Ultrasound confirmed the presence of a mass measuring 2.6 x 1.7 x 2.4 cm   08/04/2010 Initial Biopsy   left breast mass biopsy showed an invasive mammary carcinoma with lobular features. ER 95%, PR 97%, Ki-67 17%, HER-2/neu (-)   08/04/2010 Pathologic Stage   Stage IIB: T2 N1a   08/11/2010 Imaging   left breast mass 4.6 x 4.2 x 2.3 cm with  enhancement distortion extending to the nipple retraction. There is questionable anterior mediastinal lymph node seen   07/2010 - 09/03/2010 Anti-estrogen oral therapy   neoadjuvant letrozole    09/03/2010 Surgery    left breast mastectomy with sentinel node biopsy on 09/03/2010.   08/2010 - 01/2015 Anti-estrogen oral therapy   Tamoxifen, pt stopped on her own    12/03/2010 - 01/23/2011 Radiation Therapy   left chest wall and axilla radiaiton    10/19/2015 Progression   Left chest wall recurrence, s/p resection on 11/25/2015 with positive margins ([facility])    12/10/2015 - 05/31/2017 Anti-estrogen oral therapy   anastrozole 33m daily, which was switched to Tamoxifen on 06/30/2016 due to persistent disease.   01/03/2016 - 03/03/2016 Radiation Therapy   Radiation to left chest wall recurrence (12/06/2015-02/04/2016 - 44 Gy), pt progressed through the radiation, and had additional 10 fractions of radiation (02/21/16-03/03/2016 - total 70 Gy to the left chest wall)   07/14/2016 PET scan   PET 07/14/2016 IMPRESSION: 1. No findings of recurrent malignancy. 2. Radiation fibrosis in the lingula. A small left axillary lymph node is not hypermetabolic and only 6 mm in diameter. This is in the vicinity of the prior axillary cyst. 3. Pelvic floor laxity.   07/17/2016 Surgery   Chest wall soft tissue resection for local recurrence   07/17/2016 Pathology Results   Left chest wall two soft tissue resection both showed invasive lobular carcinoma, margins were positive. ER 95% positive, PR 60% positive, HER-2 negative.   07/27/2016 Imaging   CT chest, abdomen and pelvis wo contrast  07/27/2016 IMPRESSION: Sigmoid diverticulosis without acute diverticulitis. No bowel obstruction or acute inflammation. Degenerative disc disease and facet arthropathy of the lower lumbar spine with slight grade 1 anterolisthesis of L4 on L5. Stable appearing simple left-sided renal cysts.   03/01/2017 Mammogram   Mammogram  03/01/2017 IMPRESSION: No mammographic evidence of malignancy. A result letter of this screening mammogram will be mailed directly to the patient.  RECOMMENDATION: Screening mammogram in one year   04/10/2017 Imaging   PET IMPRESSION: 1. Stable exam.  No findings of recurrent malignancy. 2. Changes of external beam radiation again noted within the lingula. 3. Small left axillary lymph node exhibits mild increased uptake. Unchanged from previous exam.   06/07/2017 -  Anti-estrogen oral therapy   Stopped Tamoxifen and Began monthly Faslodex IM injection on 06/07/17  -Held after 09/26/19 due to lost f/u. Restart with loading dose on 04/29/20.    09/19/2017 Imaging   CT CAP WO Contrast 09/19/17 IMPRESSION: 1. Surgical changes from a left mastectomy without CT findings to suggest recurrent chest wall tumor or axillary lymphadenopathy. 2. No findings to suggest metastatic disease involving the neck, chest, abdomen or pelvis or osseous structures. 3. Stable radiation changes involving the lingula.    09/19/2017 Imaging   Bone Scan Whole Body  IMPRESSION: No definite scintigraphic evidence of osseous metastatic disease.  Scattered degenerative type uptake as above.   03/11/2018 PET scan   IMPRESSION:  1. No evidence of recurrent disease. 2.  Aortic atherosclerosis    09/03/2018 PET  scan   09/03/2018 PET Scan IMPRESSION: 1. No findings to suggest locally recurrent disease or metastatic disease in the neck, chest, abdomen or pelvis. 2. Hepatic steatosis. 3. Colonic diverticulosis without evidence of acute diverticulitis at this time. 4. Aortic atherosclerosis. 5. Additional incidental findings, as above.   11/25/2018 Imaging   DG Ribs Unilateral W/Chest Left  IMPRESSION: Stable left lingular scarring. Normal left ribs. No acute cardiopulmonary abnormality seen.    04/04/2019 PET scan   PET  IMPRESSION: 1. No evidence of hypermetabolic recurrent or metastatic disease. 2. Hepatic  steatosis. 3.  Aortic Atherosclerosis (ICD10-I70.0).   05/18/2020 PET scan   IMPRESSION: 1. Findings of LEFT mastectomy and axillary dissection with radiation changes, no signs of metastatic disease or disease recurrence at this time. 2. Hepatic steatosis. 3. Aortic atherosclerosis.   Aortic Atherosclerosis (ICD10-I70.0).     11/09/2020 PET scan   IMPRESSION: 1. No findings of hypermetabolic metastatic disease in the neck, chest, abdomen or pelvis. 2. Hepatic steatosis. 3. Aortic atherosclerosis. 4. Increased metabolism about the sphincter complex and lower rectum more likely physiologic; however, consider digital rectal exam and correlation with any symptoms in this location.   05/03/2021 PET scan   IMPRESSION: Status post left mastectomy with left axillary lymph node dissection. Radiation changes in the left upper lobe/lingula.   No evidence of recurrent or metastatic disease.      INTERVAL HISTORY:  Gloria [name] is here for a follow up of breast cancer. She was last seen by me on 05/07/21. She presents to the clinic accompanied by her daughter. She reports she is still being treated for a UTI, which has been ongoing for the last month to month and a half. She reports continued pain to her legs/knees, as well as to her stomach/abdomen. She notes the abdominal pain lasts only a few minutes at a time but happens every day. She reports she has intermittent diarrhea, which daughter [name] notes has been present for years. She notes it is from IBS, which she has not formally been diagnosed with but notes much of her family has it.   All other systems were reviewed with the patient and are negative.  MEDICAL HISTORY:  Past Medical History:  Diagnosis Date   Anxiety    Blood in urine    CAD (coronary artery disease)    non obstructive by cath   Cancer [facility])    breast- left   Depression    Family history of breast cancer    Family history of colon cancer    Family history of  ovarian cancer    Family history of pancreatic cancer    Family history of prostate cancer    GERD (gastroesophageal reflux disease)    Hypertension    Knee fracture    Personal history of radiation therapy 2017   TR (tricuspid regurgitation)    mild by Echo 12/2008 EF >55%    SURGICAL HISTORY: Past Surgical History:  Procedure Laterality Date   ABDOMINAL AORTAGRAM N/A 09/08/2011   Procedure: ABDOMINAL Maxcine Ham;  Surgeon: Troy Sine, MD;  Location: [facility] CATH LAB;  Service: Cardiovascular;  Laterality: N/A;   ABDOMINAL HYSTERECTOMY     BREAST EXCISIONAL BIOPSY Left 2017   X3   BREAST SURGERY  08-30-10   mastectomy   CARDIAC CATHETERIZATION  08/2011   20% LAD stenosis, 20% diagonal stenosis, 10-20% proximal dominant RCA stenosis   COLON SURGERY     LEFT HEART CATHETERIZATION WITH CORONARY ANGIOGRAM N/A 09/08/2011  Procedure: LEFT HEART CATHETERIZATION WITH CORONARY ANGIOGRAM;  Surgeon: Troy Sine, MD;  Location: [facility] CATH LAB;  Service: Cardiovascular;  Laterality: N/A;   MASTECTOMY     MINOR BREAST BIOPSY Left 11/25/2015   Procedure: MINOR EXCISION MASS LEFT CHEST WALL;  Surgeon: Autumn Messing III, MD;  Location: [place];  Service: General;  Laterality: Left;   MINOR BREAST BIOPSY Left 07/17/2016   Procedure: EXCISION OF 2 LEFT CHEST WALL MASSES;  Surgeon: Autumn Messing III, MD;  Location: [place];  Service: General;  Laterality: Left;   TONSILLECTOMY      I have reviewed the social history and family history with the patient and they are unchanged from previous note.  ALLERGIES:  is allergic to contrast media [iodinated diagnostic agents], penicillins, adhesive [tape], cephalexin, ciprofloxacin, demerol, meperidine hcl, sulfa antibiotics, sulfamethoxazole-trimethoprim, and quinolones.  MEDICATIONS:  Current Outpatient Medications  Medication Sig Dispense Refill   acetaminophen (TYLENOL) 500 MG tablet Take 1,000 mg by mouth daily as needed for  moderate pain. Reported on 03/10/2016     ALPRAZolam (XANAX) 0.25 MG tablet Take by mouth.     CALCIUM-MAGNESIUM-VITAMIN D PO Take 1 tablet by mouth 2 (two) times daily.     doxepin (SINEQUAN) 10 MG capsule Take 10 mg by mouth at bedtime.     famotidine (PEPCID) 20 MG tablet Take 20 mg by mouth daily.     FLOVENT HFA 44 MCG/ACT inhaler Inhale into the lungs.     fluticasone (FLONASE) 50 MCG/ACT nasal spray Place 1 spray into both nostrils daily as needed for allergies.      guaiFENesin (MUCINEX) 600 MG 12 hr tablet Take 600 mg by mouth daily as needed for cough or to loosen phlegm. Reported on 03/28/2016     hydrochlorothiazide (MICROZIDE) 12.5 MG capsule Take 1 capsule (12.5 mg total) by mouth daily. 90 capsule 3   losartan (COZAAR) 25 MG tablet 25 mg at bedtime.      losartan (COZAAR) 50 MG tablet Take 1 tablet by mouth once daily 90 tablet 0   nitroGLYCERIN (NITROSTAT) 0.4 MG SL tablet Place 1 tablet (0.4 mg total) under the tongue every 5 (five) minutes as needed for chest pain. <PLEASE MAKE APPOINTMENT> 25 tablet 3   PFIZER-BIONT COVID-19 VAC-TRIS SUSP injection      pseudoephedrine-acetaminophen (TYLENOL SINUS) 30-500 MG TABS tablet Take 1 tablet by mouth every 4 (four) hours as needed.     senna (SENOKOT) 8.6 MG tablet Take 1 tablet by mouth every other day.     triamcinolone (KENALOG) 0.025 % cream Apply 1 application topically 2 (two) times daily. 30 g 0   No current facility-administered medications for this visit.    PHYSICAL EXAMINATION: ECOG PERFORMANCE STATUS: 2 - Symptomatic, <50% confined to bed  Vitals:   07/05/21 1438  BP: (!) 134/99  Pulse: 74  Resp: 19  SpO2: 97%   Wt Readings from Last 3 Encounters:  07/05/21 133 lb 12.8 oz (60.7 kg)  05/05/21 131 lb 4.8 oz (59.6 kg)  05/03/21 130 lb (59 kg)     GENERAL:alert, no distress and comfortable SKIN: skin color, texture, turgor are normal, no rashes or significant lesions EYES: normal, Conjunctiva are pink and  non-injected, sclera clear  NECK: supple, thyroid normal size, non-tender, without nodularity LYMPH:  no palpable lymphadenopathy in the cervical, axillary  LUNGS: clear to auscultation and percussion with normal breathing effort HEART: regular rate & rhythm and no murmurs and no lower extremity  edema ABDOMEN:abdomen soft, non-tender and normal bowel sounds Musculoskeletal:no cyanosis of digits and no clubbing  NEURO: alert & oriented x 3 with fluent speech, no focal motor/sensory deficits BREAST: No palpable mass, nodules or adenopathy bilaterally. Breast exam benign.   LABORATORY DATA:  I have reviewed the data as listed CBC Latest Ref Rng & Units 05/03/2021 03/10/2021 01/13/2021  WBC 4.0 - 10.5 K/uL 4.2 5.5 4.4  Hemoglobin 12.0 - 15.0 g/dL 11.9(L) 12.5 12.5  Hematocrit 36.0 - 46.0 % 35.9(L) 38.1 37.9  Platelets 150 - 400 K/uL 139(L) 185 151     CMP Latest Ref Rng & Units 05/03/2021 03/10/2021 01/13/2021  Glucose 70 - 99 mg/dL 89 70 72  BUN 8 - 23 mg/dL 19 24(H) 23  Creatinine 0.44 - 1.00 mg/dL 0.85 1.04(H) 1.01(H)  Sodium 135 - 145 mmol/L 142 139 140  Potassium 3.5 - 5.1 mmol/L 4.0 4.3 3.9  Chloride 98 - 111 mmol/L 106 103 103  CO2 22 - 32 mmol/L 28 27 27   Calcium 8.9 - 10.3 mg/dL 9.6 9.1 9.4  Total Protein 6.5 - 8.1 g/dL 6.5 6.3(L) 6.5  Total Bilirubin 0.3 - 1.2 mg/dL 0.6 0.6 0.6  Alkaline Phos 38 - 126 U/L 77 81 76  AST 15 - 41 U/L 19 21 21   ALT 0 - 44 U/L 12 18 15       RADIOGRAPHIC STUDIES: I have personally reviewed the radiological images as listed and agreed with the findings in the report. No results found.    No orders of the defined types were placed in this encounter.  All questions were answered. The patient knows to call the clinic with any problems, questions or concerns. No barriers to learning was detected. The total time spent in the appointment was 30 minutes.     Truitt Merle, MD 07/05/2021   I, Wilburn Mylar, am acting as scribe for Truitt Merle, MD.   I  have reviewed the above documentation for accuracy and completeness, and I agree with the above.

## 2021-07-06 ENCOUNTER — Telehealth: Payer: Self-pay | Admitting: Hematology

## 2021-07-06 NOTE — Telephone Encounter (Signed)
Scheduled per  sch msg. Called and spoke with patientl. Confirmed appt

## 2021-07-07 ENCOUNTER — Telehealth: Payer: Self-pay | Admitting: Hematology

## 2021-07-07 NOTE — Telephone Encounter (Signed)
Scheduled follow-up appointments per 9/6 los. Patient is aware. 

## 2021-07-14 ENCOUNTER — Inpatient Hospital Stay: Payer: PPO

## 2021-07-14 ENCOUNTER — Other Ambulatory Visit: Payer: Self-pay

## 2021-07-14 VITALS — BP 136/73 | HR 64 | Temp 97.6°F | Resp 16

## 2021-07-14 DIAGNOSIS — C50112 Malignant neoplasm of central portion of left female breast: Secondary | ICD-10-CM

## 2021-07-14 DIAGNOSIS — Z170 Estrogen receptor positive status [ER+]: Secondary | ICD-10-CM

## 2021-07-14 MED ORDER — FULVESTRANT 250 MG/5ML IM SOLN
500.0000 mg | INTRAMUSCULAR | Status: DC
  Administered 2021-07-14: 500 mg via INTRAMUSCULAR

## 2021-07-25 DIAGNOSIS — M1711 Unilateral primary osteoarthritis, right knee: Secondary | ICD-10-CM | POA: Diagnosis not present

## 2021-07-25 DIAGNOSIS — M1712 Unilateral primary osteoarthritis, left knee: Secondary | ICD-10-CM | POA: Diagnosis not present

## 2021-07-25 DIAGNOSIS — M25561 Pain in right knee: Secondary | ICD-10-CM | POA: Diagnosis not present

## 2021-07-25 DIAGNOSIS — M170 Bilateral primary osteoarthritis of knee: Secondary | ICD-10-CM | POA: Diagnosis not present

## 2021-07-25 DIAGNOSIS — M25562 Pain in left knee: Secondary | ICD-10-CM | POA: Diagnosis not present

## 2021-07-26 DIAGNOSIS — N3020 Other chronic cystitis without hematuria: Secondary | ICD-10-CM | POA: Diagnosis not present

## 2021-07-28 DIAGNOSIS — R0602 Shortness of breath: Secondary | ICD-10-CM | POA: Diagnosis not present

## 2021-07-28 DIAGNOSIS — Z03818 Encounter for observation for suspected exposure to other biological agents ruled out: Secondary | ICD-10-CM | POA: Diagnosis not present

## 2021-07-28 DIAGNOSIS — R071 Chest pain on breathing: Secondary | ICD-10-CM | POA: Diagnosis not present

## 2021-08-11 ENCOUNTER — Inpatient Hospital Stay: Payer: PPO

## 2021-08-18 ENCOUNTER — Telehealth: Payer: Self-pay

## 2021-08-18 ENCOUNTER — Inpatient Hospital Stay: Payer: PPO | Attending: Hematology

## 2021-08-18 ENCOUNTER — Other Ambulatory Visit: Payer: Self-pay

## 2021-08-18 VITALS — BP 115/88 | HR 74 | Temp 97.5°F | Resp 16

## 2021-08-18 DIAGNOSIS — C50112 Malignant neoplasm of central portion of left female breast: Secondary | ICD-10-CM | POA: Diagnosis not present

## 2021-08-18 DIAGNOSIS — Z79818 Long term (current) use of other agents affecting estrogen receptors and estrogen levels: Secondary | ICD-10-CM | POA: Diagnosis not present

## 2021-08-18 DIAGNOSIS — Z170 Estrogen receptor positive status [ER+]: Secondary | ICD-10-CM | POA: Diagnosis not present

## 2021-08-18 MED ORDER — FULVESTRANT 250 MG/5ML IM SOSY
500.0000 mg | PREFILLED_SYRINGE | INTRAMUSCULAR | Status: DC
  Administered 2021-08-18: 500 mg via INTRAMUSCULAR
  Filled 2021-08-18: qty 10

## 2021-08-18 NOTE — Patient Instructions (Signed)
Fulvestrant injection What is this medication? FULVESTRANT (ful VES trant) blocks the effects of estrogen. It is used to treat breast cancer. This medicine may be used for other purposes; ask your health care provider or pharmacist if you have questions. COMMON BRAND NAME(S): FASLODEX What should I tell my care team before I take this medication? They need to know if you have any of these conditions: bleeding disorders liver disease low blood counts, like low white cell, platelet, or red cell counts an unusual or allergic reaction to fulvestrant, other medicines, foods, dyes, or preservatives pregnant or trying to get pregnant breast-feeding How should I use this medication? This medicine is for injection into a muscle. It is usually given by a health care professional in a hospital or clinic setting. Talk to your pediatrician regarding the use of this medicine in children. Special care may be needed. Overdosage: If you think you have taken too much of this medicine contact a poison control center or emergency room at once. NOTE: This medicine is only for you. Do not share this medicine with others. What if I miss a dose? It is important not to miss your dose. Call your doctor or health care professional if you are unable to keep an appointment. What may interact with this medication? medicines that treat or prevent blood clots like warfarin, enoxaparin, dalteparin, apixaban, dabigatran, and rivaroxaban This list may not describe all possible interactions. Give your health care provider a list of all the medicines, herbs, non-prescription drugs, or dietary supplements you use. Also tell them if you smoke, drink alcohol, or use illegal drugs. Some items may interact with your medicine. What should I watch for while using this medication? Your condition will be monitored carefully while you are receiving this medicine. You will need important blood work done while you are taking this  medicine. Do not become pregnant while taking this medicine or for at least 1 year after stopping it. Women of child-bearing potential will need to have a negative pregnancy test before starting this medicine. Women should inform their doctor if they wish to become pregnant or think they might be pregnant. There is a potential for serious side effects to an unborn child. Men should inform their doctors if they wish to father a child. This medicine may lower sperm counts. Talk to your health care professional or pharmacist for more information. Do not breast-feed an infant while taking this medicine or for 1 year after the last dose. What side effects may I notice from receiving this medication? Side effects that you should report to your doctor or health care professional as soon as possible: allergic reactions like skin rash, itching or hives, swelling of the face, lips, or tongue feeling faint or lightheaded, falls pain, tingling, numbness, or weakness in the legs signs and symptoms of infection like fever or chills; cough; flu-like symptoms; sore throat vaginal bleeding Side effects that usually do not require medical attention (report to your doctor or health care professional if they continue or are bothersome): aches, pains constipation diarrhea headache hot flashes nausea, vomiting pain at site where injected stomach pain This list may not describe all possible side effects. Call your doctor for medical advice about side effects. You may report side effects to FDA at 1-800-FDA-1088. Where should I keep my medication? This drug is given in a hospital or clinic and will not be stored at home. NOTE: This sheet is a summary. It may not cover all possible information. If you have   questions about this medicine, talk to your doctor, pharmacist, or health care provider.  2022 Elsevier/Gold Standard (2018-01-24 11:34:41)  

## 2021-08-18 NOTE — Telephone Encounter (Signed)
Gloria [name] came in for a Faslodex injection appointment today and asked if she could take her Faslodex injections with her new medication, Methanamine hippurate.  I spoke with Kennith Center, [facility] and she confirmed it was okay to use both Faslodex and Methanamine together. Patient verbalized understanding.

## 2021-09-01 ENCOUNTER — Telehealth: Payer: Self-pay

## 2021-09-01 NOTE — Telephone Encounter (Signed)
See phone encounter.

## 2021-09-01 NOTE — Telephone Encounter (Signed)
Sent message to scheduling to adjust the pt's lab & Faslodex injection schedule from the last administrative date which was 08/18/2021.  Asked scheduling to adjust pt's appt schedule for November & December based on note from [place], [place]  from [facility].

## 2021-09-02 ENCOUNTER — Telehealth: Payer: Self-pay | Admitting: Hematology

## 2021-09-02 DIAGNOSIS — N3020 Other chronic cystitis without hematuria: Secondary | ICD-10-CM | POA: Diagnosis not present

## 2021-09-02 NOTE — Telephone Encounter (Signed)
Sch per 11/3 inbasket, left pt daughter msg

## 2021-09-08 ENCOUNTER — Inpatient Hospital Stay: Payer: PPO

## 2021-09-13 ENCOUNTER — Other Ambulatory Visit: Payer: Self-pay | Admitting: Cardiovascular Disease

## 2021-09-19 ENCOUNTER — Inpatient Hospital Stay: Payer: PPO | Attending: Hematology

## 2021-09-19 ENCOUNTER — Inpatient Hospital Stay: Payer: PPO

## 2021-09-19 ENCOUNTER — Other Ambulatory Visit: Payer: Self-pay

## 2021-09-19 DIAGNOSIS — C50112 Malignant neoplasm of central portion of left female breast: Secondary | ICD-10-CM

## 2021-09-19 DIAGNOSIS — Z170 Estrogen receptor positive status [ER+]: Secondary | ICD-10-CM | POA: Diagnosis not present

## 2021-09-19 DIAGNOSIS — Z5111 Encounter for antineoplastic chemotherapy: Secondary | ICD-10-CM | POA: Diagnosis not present

## 2021-09-19 DIAGNOSIS — Z79818 Long term (current) use of other agents affecting estrogen receptors and estrogen levels: Secondary | ICD-10-CM | POA: Insufficient documentation

## 2021-09-19 LAB — CBC WITH DIFFERENTIAL/PLATELET
Abs Immature Granulocytes: 0.01 10*3/uL (ref 0.00–0.07)
Basophils Absolute: 0.1 10*3/uL (ref 0.0–0.1)
Basophils Relative: 1 %
Eosinophils Absolute: 0.4 10*3/uL (ref 0.0–0.5)
Eosinophils Relative: 10 %
HCT: 39.1 % (ref 36.0–46.0)
Hemoglobin: 12.7 g/dL (ref 12.0–15.0)
Immature Granulocytes: 0 %
Lymphocytes Relative: 19 %
Lymphs Abs: 0.8 10*3/uL (ref 0.7–4.0)
MCH: 31.1 pg (ref 26.0–34.0)
MCHC: 32.5 g/dL (ref 30.0–36.0)
MCV: 95.8 fL (ref 80.0–100.0)
Monocytes Absolute: 0.4 10*3/uL (ref 0.1–1.0)
Monocytes Relative: 10 %
Neutro Abs: 2.5 10*3/uL (ref 1.7–7.7)
Neutrophils Relative %: 60 %
Platelets: 190 10*3/uL (ref 150–400)
RBC: 4.08 MIL/uL (ref 3.87–5.11)
RDW: 13.2 % (ref 11.5–15.5)
WBC: 4.3 10*3/uL (ref 4.0–10.5)
nRBC: 0 % (ref 0.0–0.2)

## 2021-09-19 LAB — COMPREHENSIVE METABOLIC PANEL
ALT: 17 U/L (ref 0–44)
AST: 24 U/L (ref 15–41)
Albumin: 3.9 g/dL (ref 3.5–5.0)
Alkaline Phosphatase: 73 U/L (ref 38–126)
Anion gap: 10 (ref 5–15)
BUN: 16 mg/dL (ref 8–23)
CO2: 27 mmol/L (ref 22–32)
Calcium: 9.5 mg/dL (ref 8.9–10.3)
Chloride: 106 mmol/L (ref 98–111)
Creatinine, Ser: 1.03 mg/dL — ABNORMAL HIGH (ref 0.44–1.00)
GFR, Estimated: 51 mL/min — ABNORMAL LOW (ref >60)
Glucose, Bld: 101 mg/dL — ABNORMAL HIGH (ref 70–99)
Potassium: 4 mmol/L (ref 3.5–5.1)
Sodium: 143 mmol/L (ref 135–145)
Total Bilirubin: 0.7 mg/dL (ref 0.3–1.2)
Total Protein: 6.7 g/dL (ref 6.5–8.1)

## 2021-09-19 MED ORDER — FULVESTRANT 250 MG/5ML IM SOSY
500.0000 mg | PREFILLED_SYRINGE | INTRAMUSCULAR | Status: DC
  Administered 2021-09-19: 500 mg via INTRAMUSCULAR
  Filled 2021-09-19: qty 10

## 2021-10-06 ENCOUNTER — Ambulatory Visit: Payer: PPO

## 2021-10-06 ENCOUNTER — Ambulatory Visit: Payer: PPO | Admitting: Hematology

## 2021-10-13 ENCOUNTER — Other Ambulatory Visit: Payer: Self-pay | Admitting: Adult Health

## 2021-10-19 ENCOUNTER — Encounter: Payer: Self-pay | Admitting: Hematology

## 2021-10-19 ENCOUNTER — Other Ambulatory Visit: Payer: Self-pay

## 2021-10-19 ENCOUNTER — Inpatient Hospital Stay: Payer: PPO | Admitting: Hematology

## 2021-10-19 ENCOUNTER — Inpatient Hospital Stay: Payer: PPO | Attending: Hematology

## 2021-10-19 ENCOUNTER — Inpatient Hospital Stay: Payer: PPO

## 2021-10-19 VITALS — BP 142/78 | HR 74 | Wt 131.4 lb

## 2021-10-19 DIAGNOSIS — Z170 Estrogen receptor positive status [ER+]: Secondary | ICD-10-CM | POA: Diagnosis not present

## 2021-10-19 DIAGNOSIS — I700 Atherosclerosis of aorta: Secondary | ICD-10-CM | POA: Diagnosis not present

## 2021-10-19 DIAGNOSIS — M810 Age-related osteoporosis without current pathological fracture: Secondary | ICD-10-CM | POA: Diagnosis not present

## 2021-10-19 DIAGNOSIS — C50112 Malignant neoplasm of central portion of left female breast: Secondary | ICD-10-CM

## 2021-10-19 DIAGNOSIS — K219 Gastro-esophageal reflux disease without esophagitis: Secondary | ICD-10-CM | POA: Insufficient documentation

## 2021-10-19 DIAGNOSIS — I2510 Atherosclerotic heart disease of native coronary artery without angina pectoris: Secondary | ICD-10-CM | POA: Insufficient documentation

## 2021-10-19 DIAGNOSIS — I10 Essential (primary) hypertension: Secondary | ICD-10-CM | POA: Insufficient documentation

## 2021-10-19 DIAGNOSIS — F32A Depression, unspecified: Secondary | ICD-10-CM | POA: Insufficient documentation

## 2021-10-19 DIAGNOSIS — Z7951 Long term (current) use of inhaled steroids: Secondary | ICD-10-CM | POA: Insufficient documentation

## 2021-10-19 DIAGNOSIS — M546 Pain in thoracic spine: Secondary | ICD-10-CM | POA: Insufficient documentation

## 2021-10-19 DIAGNOSIS — Z79899 Other long term (current) drug therapy: Secondary | ICD-10-CM | POA: Insufficient documentation

## 2021-10-19 DIAGNOSIS — Z923 Personal history of irradiation: Secondary | ICD-10-CM | POA: Insufficient documentation

## 2021-10-19 DIAGNOSIS — K760 Fatty (change of) liver, not elsewhere classified: Secondary | ICD-10-CM | POA: Insufficient documentation

## 2021-10-19 LAB — COMPREHENSIVE METABOLIC PANEL
ALT: 13 U/L (ref 0–44)
AST: 21 U/L (ref 15–41)
Albumin: 4 g/dL (ref 3.5–5.0)
Alkaline Phosphatase: 70 U/L (ref 38–126)
Anion gap: 8 (ref 5–15)
BUN: 24 mg/dL — ABNORMAL HIGH (ref 8–23)
CO2: 28 mmol/L (ref 22–32)
Calcium: 9.6 mg/dL (ref 8.9–10.3)
Chloride: 106 mmol/L (ref 98–111)
Creatinine, Ser: 1.07 mg/dL — ABNORMAL HIGH (ref 0.44–1.00)
GFR, Estimated: 49 mL/min — ABNORMAL LOW (ref >60)
Glucose, Bld: 64 mg/dL — ABNORMAL LOW (ref 70–99)
Potassium: 4.2 mmol/L (ref 3.5–5.1)
Sodium: 142 mmol/L (ref 135–145)
Total Bilirubin: 0.6 mg/dL (ref 0.3–1.2)
Total Protein: 6.5 g/dL (ref 6.5–8.1)

## 2021-10-19 LAB — CBC WITH DIFFERENTIAL/PLATELET
Abs Immature Granulocytes: 0.01 10*3/uL (ref 0.00–0.07)
Basophils Absolute: 0.1 10*3/uL (ref 0.0–0.1)
Basophils Relative: 1 %
Eosinophils Absolute: 0.4 10*3/uL (ref 0.0–0.5)
Eosinophils Relative: 9 %
HCT: 38.3 % (ref 36.0–46.0)
Hemoglobin: 12.3 g/dL (ref 12.0–15.0)
Immature Granulocytes: 0 %
Lymphocytes Relative: 17 %
Lymphs Abs: 0.7 10*3/uL (ref 0.7–4.0)
MCH: 30.9 pg (ref 26.0–34.0)
MCHC: 32.1 g/dL (ref 30.0–36.0)
MCV: 96.2 fL (ref 80.0–100.0)
Monocytes Absolute: 0.4 10*3/uL (ref 0.1–1.0)
Monocytes Relative: 11 %
Neutro Abs: 2.5 10*3/uL (ref 1.7–7.7)
Neutrophils Relative %: 62 %
Platelets: 160 10*3/uL (ref 150–400)
RBC: 3.98 MIL/uL (ref 3.87–5.11)
RDW: 13 % (ref 11.5–15.5)
WBC: 4 10*3/uL (ref 4.0–10.5)
nRBC: 0 % (ref 0.0–0.2)

## 2021-10-19 NOTE — Progress Notes (Signed)
[place]   [phone]) [phone] [phone]   Clinic Follow up Note   Patient Care Team: Lajean Manes, MD as PCP - [place], [place], MD as Consulting Physician (General Surgery) Truitt Merle, MD as Consulting Physician (Hematology) [name], MD as Consulting Physician (Radiation Oncology) Sylvan Cheese, NP as Nurse Practitioner (Hematology and Oncology) Karen Kays, NP as Nurse Practitioner (Nurse Practitioner)  Date of Service:  10/19/2021  CHIEF COMPLAINT: f/u of lobular left breast cancer  CURRENT THERAPY:  Monthly Faslodex injection started on 06/07/17. Held after 09/26/19 due to lost f/u. Restart with loading dose (every 2 weeks for 3 injections) on 04/29/20.  ASSESSMENT & PLAN:  Gloria [name] is a 86 y.o. female with   1. Cancer of the central portion of left female breast, Stage IIB, T2 N1 invasive lobular carcinoma, grade 2, ER+/PR+, HER-2-, local chest wall recurrence in 09/2015   -She was diagnosed in 07/2010 but had chest wall local recurrence in 09/2015. She was on adjuvant tamoxifen for 4.5 years, stopped on her own and developed local recurrence 8 months after she stopped tamoxifen. -She has completed chest wall radiation, however developed local recurrence again and underwent a second chest wall surgery for two soft tissue resection in 06/2016. Unfortunately the surgical margins were positive. Dr. Marlou Starks did not offer more surgery because complete surgical resection was unlikely. Pt agreed.  -She has been on monthly fulvestrant injection starting 06/07/17 due to clinical suspicion for disease progress. Due to lost follow up and miscommunications, she was off treatment 09/26/19-04/29/20.  -PET 05/03/21 showed no evidence of recurrent or metastatic disease.  -She is tolerating fulvestrant injection well. She reports the injection is painful and the site will itch for the first few days. Otherwise, it is fine. She does note,  however, that her last injection was very painful and caused persistent, intermittent pain for the next month. I explained that the injection likely hit a nerve. We will try to get her a different nurse. Will continue every 4 weeks. she is on drug assistance program -I ordered restaging PET to be done before her next OV in a month   2. Mid-thoracic back pain/itching -present for the last year, worsened lately per pt -on description, she feels it is more itching than pain lately. I advised them to use benadryl cream.   3. HTN, CAD   -She'll continue follow-up with her primary care physician and cardiologist   4. Osteoporosis -Her last DEXA scan in 06/2012 showed osteoporosis -I previously encouraged her to continue calcium and Vit D and to repeat DEXA at her PCP office   5. Goal of care discussion -The patient understands the goal of care is palliative. -she is full code for now       Plan  -Proceed with fulvestrant injection next week (due to delay of the medication shipment) and continue every 4 weeks  -f/u in 5 weeks with lab and PET a week before   No problem-specific Assessment & Plan notes found for this encounter.   SUMMARY OF ONCOLOGIC HISTORY: Oncology History Overview Note  Cancer of central portion of left female breast [facility])   Staging form: Breast, AJCC 7th Edition     Pathologic stage from 08/04/2010: Stage IIB (T2, N1a, cM0) - Signed by Truitt Merle, MD on 12/09/2015      Cancer of central portion of left female breast ([place])  08/03/2010 Mammogram    left nipple retraction, and the palpable mass  subareolar left breast at the 6:00 position. Ultrasound confirmed the presence of a mass measuring 2.6 x 1.7 x 2.4 cm   08/04/2010 Initial Biopsy   left breast mass biopsy showed an invasive mammary carcinoma with lobular features. ER 95%, PR 97%, Ki-67 17%, HER-2/neu (-)   08/04/2010 Pathologic Stage   Stage IIB: T2 N1a   08/11/2010 Imaging   left breast mass 4.6 x 4.2 x 2.3 cm  with enhancement distortion extending to the nipple retraction. There is questionable anterior mediastinal lymph node seen   07/2010 - 09/03/2010 Anti-estrogen oral therapy   neoadjuvant letrozole    09/03/2010 Surgery    left breast mastectomy with sentinel node biopsy on 09/03/2010.   08/2010 - 01/2015 Anti-estrogen oral therapy   Tamoxifen, pt stopped on her own    12/03/2010 - 01/23/2011 Radiation Therapy   left chest wall and axilla radiaiton    10/19/2015 Progression   Left chest wall recurrence, s/p resection on 11/25/2015 with positive margins ([facility])    12/10/2015 - 05/31/2017 Anti-estrogen oral therapy   anastrozole $RemoveBefo'1mg'gRPxhGorqUh$  daily, which was switched to Tamoxifen on 06/30/2016 due to persistent disease.   01/03/2016 - 03/03/2016 Radiation Therapy   Radiation to left chest wall recurrence (12/06/2015-02/04/2016 - 44 Gy), pt progressed through the radiation, and had additional 10 fractions of radiation (02/21/16-03/03/2016 - total 70 Gy to the left chest wall)   07/14/2016 PET scan   PET 07/14/2016 IMPRESSION: 1. No findings of recurrent malignancy. 2. Radiation fibrosis in the lingula. A small left axillary lymph node is not hypermetabolic and only 6 mm in diameter. This is in the vicinity of the prior axillary cyst. 3. Pelvic floor laxity.   07/17/2016 Surgery   Chest wall soft tissue resection for local recurrence   07/17/2016 Pathology Results   Left chest wall two soft tissue resection both showed invasive lobular carcinoma, margins were positive. ER 95% positive, PR 60% positive, HER-2 negative.   07/27/2016 Imaging   CT chest, abdomen and pelvis wo contrast  07/27/2016 IMPRESSION: Sigmoid diverticulosis without acute diverticulitis. No bowel obstruction or acute inflammation. Degenerative disc disease and facet arthropathy of the lower lumbar spine with slight grade 1 anterolisthesis of L4 on L5. Stable appearing simple left-sided renal cysts.   03/01/2017 Mammogram   Mammogram  03/01/2017 IMPRESSION: No mammographic evidence of malignancy. A result letter of this screening mammogram will be mailed directly to the patient.  RECOMMENDATION: Screening mammogram in one year   04/10/2017 Imaging   PET IMPRESSION: 1. Stable exam.  No findings of recurrent malignancy. 2. Changes of external beam radiation again noted within the lingula. 3. Small left axillary lymph node exhibits mild increased uptake. Unchanged from previous exam.   06/07/2017 -  Anti-estrogen oral therapy   Stopped Tamoxifen and Began monthly Faslodex IM injection on 06/07/17  -Held after 09/26/19 due to lost f/u. Restart with loading dose on 04/29/20.    09/19/2017 Imaging   CT CAP WO Contrast 09/19/17 IMPRESSION: 1. Surgical changes from a left mastectomy without CT findings to suggest recurrent chest wall tumor or axillary lymphadenopathy. 2. No findings to suggest metastatic disease involving the neck, chest, abdomen or pelvis or osseous structures. 3. Stable radiation changes involving the lingula.    09/19/2017 Imaging   Bone Scan Whole Body  IMPRESSION: No definite scintigraphic evidence of osseous metastatic disease.  Scattered degenerative type uptake as above.   03/11/2018 PET scan   IMPRESSION:  1. No evidence of recurrent disease. 2.  Aortic atherosclerosis  09/03/2018 PET scan   09/03/2018 PET Scan IMPRESSION: 1. No findings to suggest locally recurrent disease or metastatic disease in the neck, chest, abdomen or pelvis. 2. Hepatic steatosis. 3. Colonic diverticulosis without evidence of acute diverticulitis at this time. 4. Aortic atherosclerosis. 5. Additional incidental findings, as above.   11/25/2018 Imaging   DG Ribs Unilateral W/Chest Left  IMPRESSION: Stable left lingular scarring. Normal left ribs. No acute cardiopulmonary abnormality seen.    04/04/2019 PET scan   PET  IMPRESSION: 1. No evidence of hypermetabolic recurrent or metastatic disease. 2. Hepatic  steatosis. 3.  Aortic Atherosclerosis (ICD10-I70.0).   05/18/2020 PET scan   IMPRESSION: 1. Findings of LEFT mastectomy and axillary dissection with radiation changes, no signs of metastatic disease or disease recurrence at this time. 2. Hepatic steatosis. 3. Aortic atherosclerosis.   Aortic Atherosclerosis (ICD10-I70.0).     11/09/2020 PET scan   IMPRESSION: 1. No findings of hypermetabolic metastatic disease in the neck, chest, abdomen or pelvis. 2. Hepatic steatosis. 3. Aortic atherosclerosis. 4. Increased metabolism about the sphincter complex and lower rectum more likely physiologic; however, consider digital rectal exam and correlation with any symptoms in this location.   05/03/2021 PET scan   IMPRESSION: Status post left mastectomy with left axillary lymph node dissection. Radiation changes in the left upper lobe/lingula.   No evidence of recurrent or metastatic disease.      INTERVAL HISTORY:  Gloria [name] is here for a follow up of breast cancer. She was last seen by me on 07/05/21. She presents to the clinic accompanied by her daughter. She reports pain and tenderness to her mid thoracic spine. She notes the pain has been present for the last year but has worsened lately. She reports the pain comes and goes, worse at night. On more questioning, she states it is more itchy than painful. She seems more out of breath today and takes a while to get her words out. She expressed concern with her last injection, as it caused a lot of pain and has caused residual, intermittent pain for a month after.   All other systems were reviewed with the patient and are negative.  MEDICAL HISTORY:  Past Medical History:  Diagnosis Date   Anxiety    Blood in urine    CAD (coronary artery disease)    non obstructive by cath   Cancer [facility])    breast- left   Depression    Family history of breast cancer    Family history of colon cancer    Family history of ovarian cancer    Family  history of pancreatic cancer    Family history of prostate cancer    GERD (gastroesophageal reflux disease)    Hypertension    Knee fracture    Personal history of radiation therapy 2017   TR (tricuspid regurgitation)    mild by Echo 12/2008 EF >55%    SURGICAL HISTORY: Past Surgical History:  Procedure Laterality Date   ABDOMINAL AORTAGRAM N/A 09/08/2011   Procedure: ABDOMINAL Maxcine Ham;  Surgeon: Troy Sine, MD;  Location: [facility] CATH LAB;  Service: Cardiovascular;  Laterality: N/A;   ABDOMINAL HYSTERECTOMY     BREAST EXCISIONAL BIOPSY Left 2017   X3   BREAST SURGERY  08-30-10   mastectomy   CARDIAC CATHETERIZATION  08/2011   20% LAD stenosis, 20% diagonal stenosis, 10-20% proximal dominant RCA stenosis   COLON SURGERY     LEFT HEART CATHETERIZATION WITH CORONARY ANGIOGRAM N/A 09/08/2011   Procedure: LEFT  HEART CATHETERIZATION WITH CORONARY ANGIOGRAM;  Surgeon: Troy Sine, MD;  Location: [facility] CATH LAB;  Service: Cardiovascular;  Laterality: N/A;   MASTECTOMY     MINOR BREAST BIOPSY Left 11/25/2015   Procedure: MINOR EXCISION MASS LEFT CHEST WALL;  Surgeon: Autumn Messing III, MD;  Location: [place];  Service: General;  Laterality: Left;   MINOR BREAST BIOPSY Left 07/17/2016   Procedure: EXCISION OF 2 LEFT CHEST WALL MASSES;  Surgeon: Autumn Messing III, MD;  Location: [place];  Service: General;  Laterality: Left;   TONSILLECTOMY      I have reviewed the social history and family history with the patient and they are unchanged from previous note.  ALLERGIES:  is allergic to contrast media [iodinated diagnostic agents], penicillins, adhesive [tape], cephalexin, ciprofloxacin, demerol, meperidine hcl, sulfa antibiotics, sulfamethoxazole-trimethoprim, and quinolones.  MEDICATIONS:  Current Outpatient Medications  Medication Sig Dispense Refill   acetaminophen (TYLENOL) 500 MG tablet Take 1,000 mg by mouth daily as needed for moderate pain. Reported on  03/10/2016     ALPRAZolam (XANAX) 0.25 MG tablet Take by mouth.     CALCIUM-MAGNESIUM-VITAMIN D PO Take 1 tablet by mouth 2 (two) times daily.     doxepin (SINEQUAN) 10 MG capsule Take 10 mg by mouth at bedtime.     famotidine (PEPCID) 20 MG tablet Take 20 mg by mouth daily.     FLOVENT HFA 44 MCG/ACT inhaler Inhale into the lungs.     fluticasone (FLONASE) 50 MCG/ACT nasal spray Place 1 spray into both nostrils daily as needed for allergies.      guaiFENesin (MUCINEX) 600 MG 12 hr tablet Take 600 mg by mouth daily as needed for cough or to loosen phlegm. Reported on 03/28/2016     hydrochlorothiazide (MICROZIDE) 12.5 MG capsule Take 1 capsule by mouth once daily 90 capsule 0   losartan (COZAAR) 25 MG tablet 25 mg at bedtime.      losartan (COZAAR) 50 MG tablet Take 1 tablet by mouth once daily 90 tablet 1   nitroGLYCERIN (NITROSTAT) 0.4 MG SL tablet Place 1 tablet (0.4 mg total) under the tongue every 5 (five) minutes as needed for chest pain. <PLEASE MAKE APPOINTMENT> 25 tablet 3   PFIZER-BIONT COVID-19 VAC-TRIS SUSP injection      pseudoephedrine-acetaminophen (TYLENOL SINUS) 30-500 MG TABS tablet Take 1 tablet by mouth every 4 (four) hours as needed.     senna (SENOKOT) 8.6 MG tablet Take 1 tablet by mouth every other day.     triamcinolone (KENALOG) 0.025 % cream Apply 1 application topically 2 (two) times daily. 30 g 0   No current facility-administered medications for this visit.    PHYSICAL EXAMINATION: ECOG PERFORMANCE STATUS: 2 - Symptomatic, <50% confined to bed  Vitals:   10/19/21 1158  BP: (!) 142/78  Pulse: 74  SpO2: 99%   Wt Readings from Last 3 Encounters:  10/19/21 131 lb 7 oz (59.6 kg)  07/05/21 133 lb 12.8 oz (60.7 kg)  05/05/21 131 lb 4.8 oz (59.6 kg)     GENERAL:alert, no distress and comfortable SKIN: skin color, texture, turgor are normal, no rashes or significant lesions EYES: normal, Conjunctiva are pink and non-injected, sclera clear  NECK: supple,  thyroid normal size, non-tender, without nodularity LYMPH:  no palpable lymphadenopathy in the cervical, axillary  LUNGS: clear to auscultation and percussion with normal breathing effort HEART: regular rate & rhythm and no murmurs and no lower extremity edema ABDOMEN:abdomen soft, non-tender and  normal bowel sounds Musculoskeletal:no cyanosis of digits and no clubbing  NEURO: alert & oriented x 3 with fluent speech, no focal motor/sensory deficits  LABORATORY DATA:  I have reviewed the data as listed CBC Latest Ref Rng & Units 10/19/2021 09/19/2021 05/03/2021  WBC 4.0 - 10.5 K/uL 4.0 4.3 4.2  Hemoglobin 12.0 - 15.0 g/dL 12.3 12.7 11.9(L)  Hematocrit 36.0 - 46.0 % 38.3 39.1 35.9(L)  Platelets 150 - 400 K/uL 160 190 139(L)     CMP Latest Ref Rng & Units 10/19/2021 09/19/2021 05/03/2021  Glucose 70 - 99 mg/dL 64(L) 101(H) 89  BUN 8 - 23 mg/dL 24(H) 16 19  Creatinine 0.44 - 1.00 mg/dL 1.07(H) 1.03(H) 0.85  Sodium 135 - 145 mmol/L 142 143 142  Potassium 3.5 - 5.1 mmol/L 4.2 4.0 4.0  Chloride 98 - 111 mmol/L 106 106 106  CO2 22 - 32 mmol/L _0 Calcium 8.9 - 10.3 mg/dL 9.6 9.5 9.6  Total Protein 6.5 - 8.1 g/dL 6.5 6.7 6.5  Total Bilirubin 0.3 - 1.2 mg/dL 0.6 0.7 0.6  Alkaline Phos 38 - 126 U/L 70 73 77  AST 15 - 41 U/L _1 ALT 0 - 44 U/L _2 RADIOGRAPHIC STUDIES: I have personally reviewed the radiological images as listed and agreed with the findings in the report. No results found.    Orders Placed This Encounter  Procedures   NM PET Image Restage (PS) Skull Base to Thigh (F-18 FDG)    Standing Status:   Future    Standing Expiration Date:   10/19/2022    Order Specific Question:   If indicated for the ordered procedure, I authorize the administration of a radiopharmaceutical per Radiology protocol    Answer:   Yes    Order Specific Question:   Preferred imaging location?    Answer:   [facility]    Order Specific Question:   Radiology Contrast  Protocol - do NOT remove file path    Answer:   [email or web address]   All questions were answered. The patient knows to call the clinic with any problems, questions or concerns. No barriers to learning was detected. The total time spent in the appointment was 30 minutes.     Truitt Merle, MD 10/19/2021   I, Wilburn Mylar, am acting as scribe for Truitt Merle, MD.   I have reviewed the above documentation for accuracy and completeness, and I agree with the above.

## 2021-10-19 NOTE — Progress Notes (Signed)
..  Patient is receiving Assistance Medication - Supplied Externally. Medication: Faslodex (fulvestrant) Manufacture: [employer] Prescription Savings Program Approval Dates: Approved from 10/30/2021 until 10/29/2022. ID: [id number] Reason: EOOP *Renewal for 2023.  Marland KitchenJuan Quam, CPhT IV Drug Replacement Specialist [place] Phone: [phone]

## 2021-10-20 DIAGNOSIS — N3090 Cystitis, unspecified without hematuria: Secondary | ICD-10-CM | POA: Diagnosis not present

## 2021-10-21 DIAGNOSIS — U071 COVID-19: Secondary | ICD-10-CM | POA: Diagnosis not present

## 2021-10-21 DIAGNOSIS — R051 Acute cough: Secondary | ICD-10-CM | POA: Diagnosis not present

## 2021-10-21 DIAGNOSIS — R062 Wheezing: Secondary | ICD-10-CM | POA: Diagnosis not present

## 2021-10-21 DIAGNOSIS — R0981 Nasal congestion: Secondary | ICD-10-CM | POA: Diagnosis not present

## 2021-10-26 ENCOUNTER — Inpatient Hospital Stay: Payer: PPO

## 2021-10-27 ENCOUNTER — Other Ambulatory Visit: Payer: Self-pay

## 2021-10-27 ENCOUNTER — Encounter (HOSPITAL_COMMUNITY): Payer: Self-pay

## 2021-10-27 ENCOUNTER — Emergency Department (HOSPITAL_COMMUNITY)
Admission: EM | Admit: 2021-10-27 | Discharge: 2021-10-27 | Disposition: A | Discharge status: Home / Self Care | Payer: PPO | Attending: Emergency Medicine | Admitting: Emergency Medicine

## 2021-10-27 ENCOUNTER — Emergency Department (HOSPITAL_COMMUNITY): Payer: PPO

## 2021-10-27 DIAGNOSIS — I10 Essential (primary) hypertension: Secondary | ICD-10-CM | POA: Diagnosis not present

## 2021-10-27 DIAGNOSIS — R0602 Shortness of breath: Secondary | ICD-10-CM | POA: Diagnosis not present

## 2021-10-27 DIAGNOSIS — I2510 Atherosclerotic heart disease of native coronary artery without angina pectoris: Secondary | ICD-10-CM | POA: Diagnosis not present

## 2021-10-27 DIAGNOSIS — Z7951 Long term (current) use of inhaled steroids: Secondary | ICD-10-CM | POA: Diagnosis not present

## 2021-10-27 DIAGNOSIS — Z87891 Personal history of nicotine dependence: Secondary | ICD-10-CM | POA: Diagnosis not present

## 2021-10-27 DIAGNOSIS — J439 Emphysema, unspecified: Secondary | ICD-10-CM | POA: Diagnosis not present

## 2021-10-27 DIAGNOSIS — J45909 Unspecified asthma, uncomplicated: Secondary | ICD-10-CM | POA: Insufficient documentation

## 2021-10-27 DIAGNOSIS — Z853 Personal history of malignant neoplasm of breast: Secondary | ICD-10-CM | POA: Diagnosis not present

## 2021-10-27 DIAGNOSIS — Z79899 Other long term (current) drug therapy: Secondary | ICD-10-CM | POA: Insufficient documentation

## 2021-10-27 DIAGNOSIS — Z8616 Personal history of COVID-19: Secondary | ICD-10-CM | POA: Diagnosis not present

## 2021-10-27 DIAGNOSIS — U071 COVID-19: Secondary | ICD-10-CM | POA: Diagnosis not present

## 2021-10-27 DIAGNOSIS — R059 Cough, unspecified: Secondary | ICD-10-CM | POA: Diagnosis not present

## 2021-10-27 HISTORY — DX: COVID-19: U07.1

## 2021-10-27 LAB — CBC WITH DIFFERENTIAL/PLATELET
Abs Immature Granulocytes: 0.01 10*3/uL (ref 0.00–0.07)
Basophils Absolute: 0 10*3/uL (ref 0.0–0.1)
Basophils Relative: 1 %
Eosinophils Absolute: 0.1 10*3/uL (ref 0.0–0.5)
Eosinophils Relative: 3 %
HCT: 37.8 % (ref 36.0–46.0)
Hemoglobin: 12.3 g/dL (ref 12.0–15.0)
Immature Granulocytes: 0 %
Lymphocytes Relative: 28 %
Lymphs Abs: 0.9 10*3/uL (ref 0.7–4.0)
MCH: 31.5 pg (ref 26.0–34.0)
MCHC: 32.5 g/dL (ref 30.0–36.0)
MCV: 96.7 fL (ref 80.0–100.0)
Monocytes Absolute: 0.4 10*3/uL (ref 0.1–1.0)
Monocytes Relative: 11 %
Neutro Abs: 1.8 10*3/uL (ref 1.7–7.7)
Neutrophils Relative %: 57 %
Platelets: 177 10*3/uL (ref 150–400)
RBC: 3.91 MIL/uL (ref 3.87–5.11)
RDW: 13.2 % (ref 11.5–15.5)
WBC: 3.2 10*3/uL — ABNORMAL LOW (ref 4.0–10.5)
nRBC: 0 % (ref 0.0–0.2)

## 2021-10-27 LAB — BRAIN NATRIURETIC PEPTIDE: B Natriuretic Peptide: 62.6 pg/mL (ref 0.0–100.0)

## 2021-10-27 LAB — BASIC METABOLIC PANEL
Anion gap: 7 (ref 5–15)
BUN: 21 mg/dL (ref 8–23)
CO2: 27 mmol/L (ref 22–32)
Calcium: 9.2 mg/dL (ref 8.9–10.3)
Chloride: 101 mmol/L (ref 98–111)
Creatinine, Ser: 0.89 mg/dL (ref 0.44–1.00)
GFR, Estimated: >60 mL/min (ref >60)
Glucose, Bld: 103 mg/dL — ABNORMAL HIGH (ref 70–99)
Potassium: 4 mmol/L (ref 3.5–5.1)
Sodium: 135 mmol/L (ref 135–145)

## 2021-10-27 LAB — TROPONIN I (HIGH SENSITIVITY)
Troponin I (High Sensitivity): 6 ng/L (ref <18)
Troponin I (High Sensitivity): 5 ng/L (ref <18)

## 2021-10-27 MED ORDER — DEXAMETHASONE SODIUM PHOSPHATE 4 MG/ML IJ SOLN
4.0000 mg | Freq: Once | INTRAMUSCULAR | Status: AC
  Administered 2021-10-27: 23:00:00 4 mg via INTRAVENOUS
  Filled 2021-10-27: qty 1

## 2021-10-27 MED ORDER — BENZONATATE 100 MG PO CAPS: 100.0000 mg | ORAL_CAPSULE | Freq: Three times a day (TID) | ORAL | 0 refills | Status: DC

## 2021-10-27 MED ORDER — ALBUTEROL SULFATE HFA 108 (90 BASE) MCG/ACT IN AERS
2.0000 | INHALATION_SPRAY | RESPIRATORY_TRACT | Status: DC | PRN
  Administered 2021-10-27: 23:00:00 2 via RESPIRATORY_TRACT
  Filled 2021-10-27: qty 6.7

## 2021-10-27 NOTE — ED Notes (Signed)
Gloria [name] (Daughter) called and ask if someone will her when there is everthing to tell. Her number is [phone]

## 2021-10-27 NOTE — ED Provider Notes (Signed)
[facility] EMERGENCY DEPARTMENT Provider Note   CSN: [id number] Arrival date & time: 10/27/21  1514     History Chief Complaint  Patient presents with   Shortness of Breath    Gloria [name] is a 87 y.o. female.  Patient presents to the emergency department for evaluation of shortness of breath.  Patient reports that she was recently diagnosed with COVID infection.  She was treated with Paxlovid, finished 1 or 2 days ago.  Patient reports that she has been experiencing increased cough and feeling short of breath today, came to the ER by ambulance.  Patient noted to have a productive cough but is not hypoxic.      Past Medical History:  Diagnosis Date   Anxiety    Blood in urine    CAD (coronary artery disease)    non obstructive by cath   Cancer [facility])    breast- left   COVID    Depression    Family history of breast cancer    Family history of colon cancer    Family history of ovarian cancer    Family history of pancreatic cancer    Family history of prostate cancer    GERD (gastroesophageal reflux disease)    Hypertension    Knee fracture    Personal history of radiation therapy 2017   TR (tricuspid regurgitation)    mild by Echo 12/2008 EF >55%    Patient Active Problem List   Diagnosis Date Noted   Family history of pancreatic cancer    Family history of prostate cancer    Family history of ovarian cancer    Family history of breast cancer    Family history of colon cancer    Chest pain 03/15/2016   Cancer of central portion of left female breast ([facility]) 12/09/2015   Bronchitis, chronic obstructive w acute bronchitis ([place]) 03/16/2014   Bronchitis with bronchospasm 03/15/2014   CAD (coronary artery disease), non obstructive  05/06/2013   HYPERTENSION, BENIGN 12/23/2008   HEMORRHOIDS 03/13/2008   OTHER DYSPHAGIA 03/13/2008   Asthma, intrinsic 10/30/2007   G E R D 09/19/2007    Past Surgical History:  Procedure Laterality Date    ABDOMINAL AORTAGRAM N/A 09/08/2011   Procedure: ABDOMINAL Maxcine Ham;  Surgeon: Troy Sine, MD;  Location: [facility] CATH LAB;  Service: Cardiovascular;  Laterality: N/A;   ABDOMINAL HYSTERECTOMY     BREAST EXCISIONAL BIOPSY Left 2017   X3   BREAST SURGERY  08-30-10   mastectomy   CARDIAC CATHETERIZATION  08/2011   20% LAD stenosis, 20% diagonal stenosis, 10-20% proximal dominant RCA stenosis   COLON SURGERY     LEFT HEART CATHETERIZATION WITH CORONARY ANGIOGRAM N/A 09/08/2011   Procedure: LEFT HEART CATHETERIZATION WITH CORONARY ANGIOGRAM;  Surgeon: Troy Sine, MD;  Location: [facility] CATH LAB;  Service: Cardiovascular;  Laterality: N/A;   MASTECTOMY     MINOR BREAST BIOPSY Left 11/25/2015   Procedure: MINOR EXCISION MASS LEFT CHEST WALL;  Surgeon: Autumn Messing III, MD;  Location: [place];  Service: General;  Laterality: Left;   MINOR BREAST BIOPSY Left 07/17/2016   Procedure: EXCISION OF 2 LEFT CHEST WALL MASSES;  Surgeon: Autumn Messing III, MD;  Location: [place];  Service: General;  Laterality: Left;   TONSILLECTOMY       OB History   No obstetric history on file.     Family History  Problem Relation Age of Onset   Prostate cancer Father  34       metastatic   Heart disease Sister    Breast cancer Sister 62   Pancreatic cancer Brother        dx in his 64s   Colon cancer Maternal Grandmother        dx <50   Dementia Mother    Sudden death Brother    Heart attack Brother    Heart attack Brother    Arthritis Sister    Cancer Paternal Uncle        unknown type   Ovarian cancer Niece 27    Social History   Tobacco Use   Smoking status: Former    Types: Cigarettes    Quit date: 11/15/1991    Years since quitting: 29.9   Smokeless tobacco: Never  Substance Use Topics   Alcohol use: Yes    Alcohol/week: 2.0 standard drinks    Types: 2 Glasses of wine per week    Comment: 1 glass per week   Drug use: No    Home Medications Prior to Admission  medications   Medication Sig Start Date End Date Taking? Authorizing Provider  benzonatate (TESSALON) 100 MG capsule Take 1 capsule (100 mg total) by mouth every 8 (eight) hours. 10/27/21  Yes Cavon Nicolls, Gwenyth Allegra, MD  acetaminophen (TYLENOL) 500 MG tablet Take 1,000 mg by mouth daily as needed for moderate pain. Reported on 03/10/2016    [provider]  ALPRAZolam Duanne Moron) 0.25 MG tablet Take by mouth.    [provider]  CALCIUM-MAGNESIUM-VITAMIN D PO Take 1 tablet by mouth 2 (two) times daily.    [provider]  doxepin (SINEQUAN) 10 MG capsule Take 10 mg by mouth at bedtime.    [provider]  famotidine (PEPCID) 20 MG tablet Take 20 mg by mouth daily.    [provider]  FLOVENT HFA 44 MCG/ACT inhaler Inhale into the lungs. 11/20/20   [provider]  fluticasone (FLONASE) 50 MCG/ACT nasal spray Place 1 spray into both nostrils daily as needed for allergies.     [provider]  guaiFENesin (MUCINEX) 600 MG 12 hr tablet Take 600 mg by mouth daily as needed for cough or to loosen phlegm. Reported on 03/28/2016    [provider]  hydrochlorothiazide (MICROZIDE) 12.5 MG capsule Take 1 capsule by mouth once daily 10/13/21   Lendon Colonel, NP  losartan (COZAAR) 25 MG tablet 25 mg at bedtime.     [provider]  losartan (COZAAR) 50 MG tablet Take 1 tablet by mouth once daily 09/13/21   Troy Sine, MD  nitroGLYCERIN (NITROSTAT) 0.4 MG SL tablet Place 1 tablet (0.4 mg total) under the tongue every 5 (five) minutes as needed for chest pain. <PLEASE MAKE APPOINTMENT> 02/25/16   Troy Sine, MD  PFIZER-BIONT COVID-19 VAC-TRIS SUSP injection  05/15/21   [provider]  pseudoephedrine-acetaminophen (TYLENOL SINUS) 30-500 MG TABS tablet Take 1 tablet by mouth every 4 (four) hours as needed.    [provider]  senna (SENOKOT) 8.6 MG tablet Take 1 tablet by mouth every other day.    [provider]  triamcinolone (KENALOG) 0.025 % cream Apply 1 application topically 2 (two) times daily. 11/20/16   Truitt Merle, MD    Allergies    Contrast media [iodinated contrast media], Penicillins, Adhesive [tape], Cephalexin, Ciprofloxacin, Demerol, Meperidine hcl, Sulfa antibiotics, Sulfamethoxazole-trimethoprim, and Quinolones  Review of Systems   Review of Systems  Respiratory:  Positive for  cough and shortness of breath.   All other systems reviewed and are negative.  Physical Exam Updated Vital Signs BP (!) 152/86    Pulse 77    Temp 98.8 F (37.1 C) (Oral)    Resp 20    Ht 5\' 1"  (1.549 m)    Wt 59 kg    SpO2 95%    BMI 24.56 kg/m   Physical Exam Vitals and nursing note reviewed.  Constitutional:      General: She is not in acute distress.    Appearance: Normal appearance. She is well-developed.  HENT:     Head: Normocephalic and atraumatic.     Right Ear: Hearing normal.     Left Ear: Hearing normal.     Nose: Nose normal.  Eyes:     Conjunctiva/sclera: Conjunctivae normal.     Pupils: Pupils are equal, round, and reactive to light.  Cardiovascular:     Rate and Rhythm: Regular rhythm.     Heart sounds: S1 normal and S2 normal. No murmur heard.   No friction rub. No gallop.  Pulmonary:     Effort: Pulmonary effort is normal. No respiratory distress.     Breath sounds: Normal breath sounds.  Chest:     Chest wall: No tenderness.  Abdominal:     General: Bowel sounds are normal.     Palpations: Abdomen is soft.     Tenderness: There is no abdominal tenderness. There is no guarding or rebound. Negative signs include Murphy's sign and McBurney's sign.     Hernia: No hernia is present.  Musculoskeletal:        General: Normal range of motion.     Cervical back: Normal range of motion and neck supple.  Skin:    General: Skin is warm and dry.     Findings: No rash.  Neurological:     Mental Status: She is alert and oriented to person, place, and time.     GCS:  GCS eye subscore is 4. GCS verbal subscore is 5. GCS motor subscore is 6.     Cranial Nerves: No cranial nerve deficit.     Sensory: No sensory deficit.     Coordination: Coordination normal.  Psychiatric:        Speech: Speech normal.        Behavior: Behavior normal.        Thought Content: Thought content normal.    ED Results / Procedures / Treatments   Labs (all labs ordered are listed, but only abnormal results are displayed) Labs Reviewed  CBC WITH DIFFERENTIAL/PLATELET - Abnormal; Notable for the following components:      Result Value   WBC 3.2 (*)    All other components within normal limits  BASIC METABOLIC PANEL - Abnormal; Notable for the following components:   Glucose, Bld 103 (*)    All other components within normal limits  BRAIN NATRIURETIC PEPTIDE  TROPONIN I (HIGH SENSITIVITY)  TROPONIN I (HIGH SENSITIVITY)    EKG EKG Interpretation  Date/Time:  Thursday October 27 2021 17:58:30 EST Ventricular Rate:  75 PR Interval:  149 QRS Duration: 89 QT Interval:  422 QTC Calculation: 472 R Axis:   -1 Text Interpretation: Sinus rhythm Normal ECG Confirmed by Orpah Greek ([id number]) on 10/27/2021 6:24:45 PM  Radiology DG Chest Port 1 View  Result Date: 10/27/2021 CLINICAL DATA:  Shortness of breath EXAM: PORTABLE CHEST 1 VIEW COMPARISON:  07/28/2021 FINDINGS: Shallow inspiration with elevation of the left hemidiaphragm. Atelectasis  or scarring in the left base. Heart size and pulmonary vascularity are normal. Emphysematous changes in the lungs. No focal consolidation. Surgical clips in the left chest wall and left breast. Tortuous aorta. IMPRESSION: Shallow inspiration with atelectasis or scarring in the left base. No focal consolidation. Electronically Signed   By: Lucienne Capers M.D.   On: 10/27/2021 17:49    Procedures Procedures   Medications Ordered in ED Medications  albuterol (VENTOLIN HFA) 108 (90 Base) MCG/ACT inhaler 2 puff (has no  administration in time range)  dexamethasone (DECADRON) injection 4 mg (has no administration in time range)    ED Course  I have reviewed the triage vital signs and the nursing notes.  Pertinent labs & imaging results that were available during my care of the patient were reviewed by me and considered in my medical decision making (see chart for details).    MDM Rules/Calculators/A&P                         Patient presents to the emergency department indicating that she is short of breath.  Patient was diagnosed with COVID recently and has completed a course of Paxlovid.  Additionally she has had multiple vaccinations.  Patient noted to still have a productive cough, bringing up sputum.  When she has paroxysms of cough is when she feels short of breath.  Patient has not hypoxic or requiring oxygen.  She is afebrile, vital signs are normal other than very slight elevation of blood pressure.  Chest x-ray does not show any evidence of a pneumonia.  She has apparently been using an old inhaler at home without improvement.  Her blood work is reassuring.  This includes no evidence of congestive heart failure.  She has normal troponins.  EKG without ischemia.  Patient is not tachycardic or tachypneic, does not appear to be in any respiratory distress.  I do not have any suspicion for PE as her shortness of breath coincides with her coughing spells.  Discussed with her daughter, Oretha Milch.  Patient does not require hospitalization at this time, family will pick her up.  We will give her an inhaler, low-dose steroids, cough medicine to help her sleep.  Her granddaughter normally stays with her.     Final Clinical Impression(s) / ED Diagnoses Final diagnoses:  [id number]    Rx / DC Orders ED Discharge Orders          Ordered    benzonatate (TESSALON) 100 MG capsule  Every 8 hours        10/27/21 2250             Orpah Greek, MD 10/27/21 2250

## 2021-10-27 NOTE — ED Triage Notes (Signed)
"  Tested positive for covid at home on 12/23. Since then she has had a productive cough and shortness of breath that has not went away and got slightly worse today" per EMS

## 2021-11-03 DIAGNOSIS — J0100 Acute maxillary sinusitis, unspecified: Secondary | ICD-10-CM | POA: Diagnosis not present

## 2021-11-06 DIAGNOSIS — H6123 Impacted cerumen, bilateral: Secondary | ICD-10-CM | POA: Diagnosis not present

## 2021-11-15 DIAGNOSIS — H6121 Impacted cerumen, right ear: Secondary | ICD-10-CM | POA: Diagnosis not present

## 2021-11-15 DIAGNOSIS — H60331 Swimmer's ear, right ear: Secondary | ICD-10-CM | POA: Diagnosis not present

## 2021-11-17 ENCOUNTER — Ambulatory Visit (HOSPITAL_COMMUNITY): Payer: PPO

## 2021-11-23 ENCOUNTER — Inpatient Hospital Stay: Payer: PPO

## 2021-11-23 ENCOUNTER — Inpatient Hospital Stay: Payer: PPO | Admitting: Hematology

## 2021-11-24 DIAGNOSIS — J449 Chronic obstructive pulmonary disease, unspecified: Secondary | ICD-10-CM | POA: Diagnosis not present

## 2021-11-24 DIAGNOSIS — J42 Unspecified chronic bronchitis: Secondary | ICD-10-CM | POA: Diagnosis not present

## 2021-11-25 ENCOUNTER — Other Ambulatory Visit: Payer: Self-pay | Admitting: Internal Medicine

## 2021-11-25 ENCOUNTER — Ambulatory Visit
Admission: RE | Admit: 2021-11-25 | Discharge: 2021-11-25 | Disposition: A | Discharge status: Home / Self Care | Payer: PPO | Source: Ambulatory Visit | Attending: Internal Medicine | Admitting: Internal Medicine

## 2021-11-25 DIAGNOSIS — J449 Chronic obstructive pulmonary disease, unspecified: Secondary | ICD-10-CM

## 2021-11-25 DIAGNOSIS — M40209 Unspecified kyphosis, site unspecified: Secondary | ICD-10-CM | POA: Diagnosis not present

## 2021-11-25 DIAGNOSIS — J42 Unspecified chronic bronchitis: Secondary | ICD-10-CM | POA: Diagnosis not present

## 2021-12-09 ENCOUNTER — Other Ambulatory Visit: Payer: Self-pay

## 2021-12-12 NOTE — Progress Notes (Signed)
Synopsis: Referred for cough by Lajean Manes, MD  Subjective:   PATIENT ID: Gloria [name] GENDER: female DOB: Oct 06, 1928, MRN: [id number]  Chief Complaint  Patient presents with   Consult    Pt states she has had a cough off and on for about 3 months. States she went to the ED due to having problems with her breathing   88yF with history of Asthma/COPD overlap previously followed by Dr. Melvyn Novas, left breast cancer stage IIB ER/PR+ HER -2/neu - s/p mastectomy 2011 XRT to left chest wall and axilla c/b L chest wall recurrence s/p resection 2017 with positive margins and then XRT now on fulvestrant, nCAD, DD, covid-19 infection recently referred for cough  Took paxlovid and then worsened afterward. Cough that was very frequent coughing up whitish sputum. Went to ED 10/27/21 for it and was given tessalon perles, wasn't hypoxic there.   Uses flovent inhaler 1 puff twice daily. Rinses mouth after use. She says she definitely has more severe cough off of flovent.   Otherwise pertinent review of systems is negative.  Past Medical History:  Diagnosis Date   Anxiety    Blood in urine    CAD (coronary artery disease)    non obstructive by cath   Cancer [facility])    breast- left   COVID    Depression    Family history of breast cancer    Family history of colon cancer    Family history of ovarian cancer    Family history of pancreatic cancer    Family history of prostate cancer    GERD (gastroesophageal reflux disease)    Hypertension    Knee fracture    Personal history of radiation therapy 2017   TR (tricuspid regurgitation)    mild by Echo 12/2008 EF >55%     Family History  Problem Relation Age of Onset   Prostate cancer Father 42       metastatic   Heart disease Sister    Breast cancer Sister 52   Pancreatic cancer Brother        dx in his 47s   Colon cancer Maternal Grandmother        dx <50   Dementia Mother    Sudden death Brother    Heart attack Brother    Heart attack  Brother    Arthritis Sister    Cancer Paternal Uncle        unknown type   Ovarian cancer Niece 64     Past Surgical History:  Procedure Laterality Date   ABDOMINAL AORTAGRAM N/A 09/08/2011   Procedure: ABDOMINAL Maxcine Ham;  Surgeon: Troy Sine, MD;  Location: [facility] CATH LAB;  Service: Cardiovascular;  Laterality: N/A;   ABDOMINAL HYSTERECTOMY     BREAST EXCISIONAL BIOPSY Left 2017   X3   BREAST SURGERY  08-30-10   mastectomy   CARDIAC CATHETERIZATION  08/2011   20% LAD stenosis, 20% diagonal stenosis, 10-20% proximal dominant RCA stenosis   COLON SURGERY     LEFT HEART CATHETERIZATION WITH CORONARY ANGIOGRAM N/A 09/08/2011   Procedure: LEFT HEART CATHETERIZATION WITH CORONARY ANGIOGRAM;  Surgeon: Troy Sine, MD;  Location: [facility] CATH LAB;  Service: Cardiovascular;  Laterality: N/A;   MASTECTOMY     MINOR BREAST BIOPSY Left 11/25/2015   Procedure: MINOR EXCISION MASS LEFT CHEST WALL;  Surgeon: Autumn Messing III, MD;  Location: [place];  Service: General;  Laterality: Left;   MINOR BREAST BIOPSY Left 07/17/2016   Procedure:  EXCISION OF 2 LEFT CHEST WALL MASSES;  Surgeon: Autumn Messing III, MD;  Location: [name];  Service: General;  Laterality: Left;   TONSILLECTOMY      Social History   Socioeconomic History   Marital status: Divorced    Spouse name: Not on file   Number of children: Not on file   Years of education: Not on file   Highest education level: Not on file  Occupational History   Not on file  Tobacco Use   Smoking status: Former    Types: Cigarettes    Quit date: 11/15/1991    Years since quitting: 30.1   Smokeless tobacco: Never  Substance and Sexual Activity   Alcohol use: Yes    Alcohol/week: 2.0 standard drinks    Types: 2 Glasses of wine per week    Comment: 1 glass per week   Drug use: No   Sexual activity: Not Currently  Other Topics Concern   Not on file  Social History Narrative   Not on file   Social Determinants of  Health   Financial Resource Strain: Not on file  Food Insecurity: Not on file  Transportation Needs: Not on file  Physical Activity: Not on file  Stress: Not on file  Social Connections: Not on file  Intimate Partner Violence: Not on file     Allergies  Allergen Reactions   Contrast Media [Iodinated Contrast Media] Anaphylaxis    09/19/17-Pt premedicated. Then pt stated she was DNR. Spoke with [name] who did not realize allergy was recorded as anaphylactic. [name] verbally gave ok to change CT exam to without IV contrast. Pt and daughter do not recall occurrence of anaphylactic reaction to contrast media. Geanie Kenning, 3:27pm   Penicillins Anaphylaxis and Swelling    Has patient had a PCN reaction causing immediate rash, facial/tongue/throat swelling, SOB or lightheadedness with hypotension: Yes Has patient had a PCN reaction causing severe rash involving mucus membranes or skin necrosis: No Has patient had a PCN reaction that required hospitalization Yes Has patient had a PCN reaction occurring within the last 10 years: No If all of the above answers are "NO", then may proceed with Cephalosporin use.    Adhesive [Tape] Other (See Comments)    blisters   Cephalexin Swelling   Ciprofloxacin Swelling   Demerol Nausea And Vomiting   Meperidine Hcl    Sulfa Antibiotics    Sulfamethoxazole-Trimethoprim Itching and Swelling    REACTION: swelling/hives   Quinolones Itching and Rash    REACTION: itching, rash Pt. Reports no problems with levaquin     Outpatient Medications Prior to Visit  Medication Sig Dispense Refill   acetaminophen (TYLENOL) 500 MG tablet Take 1,000 mg by mouth daily as needed for moderate pain. Reported on 03/10/2016     ALPRAZolam (XANAX) 0.25 MG tablet Take by mouth.     benzonatate (TESSALON) 100 MG capsule Take 1 capsule (100 mg total) by mouth every 8 (eight) hours. 21 capsule 0   CALCIUM-MAGNESIUM-VITAMIN D PO Take 1 tablet by mouth 2 (two) times daily.      doxepin (SINEQUAN) 10 MG capsule Take 10 mg by mouth at bedtime.     famotidine (PEPCID) 20 MG tablet Take 20 mg by mouth daily.     FLOVENT HFA 44 MCG/ACT inhaler Inhale into the lungs.     fluticasone (FLONASE) 50 MCG/ACT nasal spray Place 1 spray into both nostrils daily as needed for allergies.      guaiFENesin (  MUCINEX) 600 MG 12 hr tablet Take 600 mg by mouth daily as needed for cough or to loosen phlegm. Reported on 03/28/2016     hydrochlorothiazide (MICROZIDE) 12.5 MG capsule Take 1 capsule by mouth once daily 90 capsule 0   losartan (COZAAR) 25 MG tablet 25 mg at bedtime.      losartan (COZAAR) 50 MG tablet Take 1 tablet by mouth once daily 90 tablet 1   nitroGLYCERIN (NITROSTAT) 0.4 MG SL tablet Place 1 tablet (0.4 mg total) under the tongue every 5 (five) minutes as needed for chest pain. <PLEASE MAKE APPOINTMENT> 25 tablet 3   pseudoephedrine-acetaminophen (TYLENOL SINUS) 30-500 MG TABS tablet Take 1 tablet by mouth every 4 (four) hours as needed.     PFIZER-BIONT COVID-19 VAC-TRIS SUSP injection      senna (SENOKOT) 8.6 MG tablet Take 1 tablet by mouth every other day.     triamcinolone (KENALOG) 0.025 % cream Apply 1 application topically 2 (two) times daily. 30 g 0   No facility-administered medications prior to visit.       Objective:   Physical Exam:  General appearance: 88 y.o., female, NAD, conversant  Eyes: anicteric sclerae; PERRL, tracking appropriately HENT: NCAT; MMM R TM clear with some dried blood in EAC, L TM clear, a little wax bl Neck: Trachea midline; no lymphadenopathy, no JVD Lungs: Diminished bl, no crackles, no wheeze, with normal respiratory effort CV: RRR, no murmur  Abdomen: Soft, non-tender; non-distended, BS present  Extremities: No peripheral edema, warm Skin: Normal turgor and texture; no rash Psych: Appropriate affect Neuro: Alert and oriented to person and place, no focal deficit     Vitals:   12/14/21 1140  BP: 118/76  Pulse: 79   Temp: 97.7 F (36.5 C)  TempSrc: Oral  SpO2: 96%  Weight: 133 lb (60.3 kg)  Height: 5\' 1"  (1.549 m)   96% on RA BMI Readings from Last 3 Encounters:  12/14/21 25.13 kg/m  10/27/21 24.56 kg/m  10/19/21 24.83 kg/m   Wt Readings from Last 3 Encounters:  12/14/21 133 lb (60.3 kg)  10/27/21 130 lb (59 kg)  10/19/21 131 lb 7 oz (59.6 kg)     CBC    Component Value Date/Time   WBC 3.2 (L) 10/27/2021 1727   RBC 3.91 10/27/2021 1727   HGB 12.3 10/27/2021 1727   HGB 11.9 (L) 05/03/2021 1223   HGB 12.9 09/24/2017 1126   HCT 37.8 10/27/2021 1727   HCT 39.6 09/24/2017 1126   PLT 177 10/27/2021 1727   PLT 139 (L) 05/03/2021 1223   PLT 133 (L) 09/24/2017 1126   MCV 96.7 10/27/2021 1727   MCV 95.2 09/24/2017 1126   MCH 31.5 10/27/2021 1727   MCHC 32.5 10/27/2021 1727   RDW 13.2 10/27/2021 1727   RDW 13.2 09/24/2017 1126   LYMPHSABS 0.9 10/27/2021 1727   LYMPHSABS 0.7 (L) 09/24/2017 1126   MONOABS 0.4 10/27/2021 1727   MONOABS 0.2 09/24/2017 1126   EOSABS 0.1 10/27/2021 1727   EOSABS 0.3 09/24/2017 1126   BASOSABS 0.0 10/27/2021 1727   BASOSABS 0.0 09/24/2017 1126      Chest Imaging:  CXR 11/25/21 reviewed by me no acute findings  PET/CT 06/27/21 reviewed by me with scarring in lingula, emphysema  Pulmonary Functions Testing Results: No flowsheet data found.    Echocardiogram:   TTE 07/2016: - Left ventricle: The cavity size was normal. Wall thickness was    normal. Systolic function was normal. The estimated ejection  fraction was in the range of 60% to 65%. Wall motion was normal;    there were no regional wall motion abnormalities. Doppler    parameters are consistent with abnormal left ventricular    relaxation (grade 1 diastolic dysfunction).  - Left atrium: The atrium was mildly dilated.      Assessment & Plan:   # Possible mild persistent asthma without exacerbation  # Recent covid-19 infection  Plan: - would continue flovent 44 1 puff BID  rinsing mouth afterward. Although dx of asthma is unclear she claims she typically has far more cough off of it and just recently had covid which can certainly aggravate and even contribute to small airways disease.  - albuterol prn - this summer we can consider de-escalating dose of flovent, consider pursuing pre/post BD spirometry as she is at risk for COPD as well given smoking history      Maryjane Hurter, MD [facility] 12/14/2021 2:05 PM

## 2021-12-14 ENCOUNTER — Other Ambulatory Visit: Payer: Self-pay

## 2021-12-14 ENCOUNTER — Ambulatory Visit (INDEPENDENT_AMBULATORY_CARE_PROVIDER_SITE_OTHER): Payer: PPO | Admitting: Student

## 2021-12-14 ENCOUNTER — Encounter: Payer: Self-pay | Admitting: Student

## 2021-12-14 VITALS — BP 118/76 | HR 79 | Temp 97.7°F | Ht 61.0 in | Wt 133.0 lb

## 2021-12-14 DIAGNOSIS — J4530 Mild persistent asthma, uncomplicated: Secondary | ICD-10-CM

## 2021-12-14 DIAGNOSIS — U071 COVID-19: Secondary | ICD-10-CM

## 2021-12-14 DIAGNOSIS — R053 Chronic cough: Secondary | ICD-10-CM

## 2021-12-14 NOTE — Patient Instructions (Signed)
-   can keep using flovent 1 puff twice daily, rinse mouth after use - we'll touch base over the summer to decide together whether we should decrease dose to 1 puff once daily, discontinue it, or pursue breathing tests to see if you have asthma/COPD

## 2021-12-19 ENCOUNTER — Other Ambulatory Visit: Payer: Self-pay

## 2021-12-19 ENCOUNTER — Inpatient Hospital Stay: Payer: PPO | Attending: Hematology

## 2021-12-19 ENCOUNTER — Ambulatory Visit (HOSPITAL_COMMUNITY)
Admission: RE | Admit: 2021-12-19 | Discharge: 2021-12-19 | Disposition: A | Discharge status: Home / Self Care | Payer: PPO | Source: Ambulatory Visit | Attending: Hematology | Admitting: Hematology

## 2021-12-19 DIAGNOSIS — I700 Atherosclerosis of aorta: Secondary | ICD-10-CM | POA: Insufficient documentation

## 2021-12-19 DIAGNOSIS — Z79818 Long term (current) use of other agents affecting estrogen receptors and estrogen levels: Secondary | ICD-10-CM | POA: Insufficient documentation

## 2021-12-19 DIAGNOSIS — C50112 Malignant neoplasm of central portion of left female breast: Secondary | ICD-10-CM | POA: Insufficient documentation

## 2021-12-19 DIAGNOSIS — Z170 Estrogen receptor positive status [ER+]: Secondary | ICD-10-CM | POA: Insufficient documentation

## 2021-12-19 DIAGNOSIS — I2510 Atherosclerotic heart disease of native coronary artery without angina pectoris: Secondary | ICD-10-CM | POA: Insufficient documentation

## 2021-12-19 DIAGNOSIS — M810 Age-related osteoporosis without current pathological fracture: Secondary | ICD-10-CM | POA: Insufficient documentation

## 2021-12-19 DIAGNOSIS — N2889 Other specified disorders of kidney and ureter: Secondary | ICD-10-CM | POA: Diagnosis not present

## 2021-12-19 DIAGNOSIS — C50919 Malignant neoplasm of unspecified site of unspecified female breast: Secondary | ICD-10-CM | POA: Diagnosis not present

## 2021-12-19 DIAGNOSIS — Z79810 Long term (current) use of selective estrogen receptor modulators (SERMs): Secondary | ICD-10-CM | POA: Insufficient documentation

## 2021-12-19 DIAGNOSIS — Z79811 Long term (current) use of aromatase inhibitors: Secondary | ICD-10-CM | POA: Insufficient documentation

## 2021-12-19 DIAGNOSIS — I10 Essential (primary) hypertension: Secondary | ICD-10-CM | POA: Insufficient documentation

## 2021-12-19 DIAGNOSIS — Z923 Personal history of irradiation: Secondary | ICD-10-CM | POA: Insufficient documentation

## 2021-12-19 DIAGNOSIS — M546 Pain in thoracic spine: Secondary | ICD-10-CM | POA: Insufficient documentation

## 2021-12-19 DIAGNOSIS — Z79899 Other long term (current) drug therapy: Secondary | ICD-10-CM | POA: Insufficient documentation

## 2021-12-19 DIAGNOSIS — K760 Fatty (change of) liver, not elsewhere classified: Secondary | ICD-10-CM | POA: Insufficient documentation

## 2021-12-19 DIAGNOSIS — K5730 Diverticulosis of large intestine without perforation or abscess without bleeding: Secondary | ICD-10-CM | POA: Insufficient documentation

## 2021-12-19 LAB — CBC WITH DIFFERENTIAL/PLATELET
Abs Immature Granulocytes: 0.01 10*3/uL (ref 0.00–0.07)
Basophils Absolute: 0.1 10*3/uL (ref 0.0–0.1)
Basophils Relative: 1 %
Eosinophils Absolute: 0.3 10*3/uL (ref 0.0–0.5)
Eosinophils Relative: 8 %
HCT: 37.4 % (ref 36.0–46.0)
Hemoglobin: 12.1 g/dL (ref 12.0–15.0)
Immature Granulocytes: 0 %
Lymphocytes Relative: 21 %
Lymphs Abs: 0.8 10*3/uL (ref 0.7–4.0)
MCH: 31.1 pg (ref 26.0–34.0)
MCHC: 32.4 g/dL (ref 30.0–36.0)
MCV: 96.1 fL (ref 80.0–100.0)
Monocytes Absolute: 0.4 10*3/uL (ref 0.1–1.0)
Monocytes Relative: 9 %
Neutro Abs: 2.4 10*3/uL (ref 1.7–7.7)
Neutrophils Relative %: 61 %
Platelets: 156 10*3/uL (ref 150–400)
RBC: 3.89 MIL/uL (ref 3.87–5.11)
RDW: 13.1 % (ref 11.5–15.5)
WBC: 3.9 10*3/uL — ABNORMAL LOW (ref 4.0–10.5)
nRBC: 0 % (ref 0.0–0.2)

## 2021-12-19 LAB — COMPREHENSIVE METABOLIC PANEL
ALT: 13 U/L (ref 0–44)
AST: 22 U/L (ref 15–41)
Albumin: 3.9 g/dL (ref 3.5–5.0)
Alkaline Phosphatase: 77 U/L (ref 38–126)
Anion gap: 3 — ABNORMAL LOW (ref 5–15)
BUN: 21 mg/dL (ref 8–23)
CO2: 31 mmol/L (ref 22–32)
Calcium: 9.3 mg/dL (ref 8.9–10.3)
Chloride: 108 mmol/L (ref 98–111)
Creatinine, Ser: 1.02 mg/dL — ABNORMAL HIGH (ref 0.44–1.00)
GFR, Estimated: 52 mL/min — ABNORMAL LOW (ref >60)
Glucose, Bld: 98 mg/dL (ref 70–99)
Potassium: 4.1 mmol/L (ref 3.5–5.1)
Sodium: 142 mmol/L (ref 135–145)
Total Bilirubin: 0.6 mg/dL (ref 0.3–1.2)
Total Protein: 6.4 g/dL — ABNORMAL LOW (ref 6.5–8.1)

## 2021-12-19 LAB — GLUCOSE, CAPILLARY: Glucose-Capillary: 88 mg/dL (ref 70–99)

## 2021-12-19 MED ORDER — FLUDEOXYGLUCOSE F - 18 (FDG) INJECTION
6.6300 | Freq: Once | INTRAVENOUS | Status: AC | PRN
  Administered 2021-12-19: 6.63 via INTRAVENOUS

## 2021-12-22 DIAGNOSIS — M170 Bilateral primary osteoarthritis of knee: Secondary | ICD-10-CM | POA: Diagnosis not present

## 2021-12-23 ENCOUNTER — Inpatient Hospital Stay: Payer: PPO

## 2021-12-23 ENCOUNTER — Other Ambulatory Visit (HOSPITAL_COMMUNITY): Payer: Self-pay

## 2021-12-23 ENCOUNTER — Encounter: Payer: Self-pay | Admitting: Hematology

## 2021-12-23 ENCOUNTER — Telehealth: Payer: Self-pay | Admitting: Pharmacist

## 2021-12-23 ENCOUNTER — Telehealth: Payer: Self-pay

## 2021-12-23 ENCOUNTER — Other Ambulatory Visit: Payer: Self-pay

## 2021-12-23 ENCOUNTER — Inpatient Hospital Stay (HOSPITAL_BASED_OUTPATIENT_CLINIC_OR_DEPARTMENT_OTHER): Payer: PPO | Admitting: Hematology

## 2021-12-23 VITALS — BP 144/82 | HR 103 | Temp 98.2°F | Resp 17 | Wt 133.6 lb

## 2021-12-23 DIAGNOSIS — K760 Fatty (change of) liver, not elsewhere classified: Secondary | ICD-10-CM | POA: Diagnosis not present

## 2021-12-23 DIAGNOSIS — Z170 Estrogen receptor positive status [ER+]: Secondary | ICD-10-CM | POA: Diagnosis not present

## 2021-12-23 DIAGNOSIS — Z923 Personal history of irradiation: Secondary | ICD-10-CM | POA: Diagnosis not present

## 2021-12-23 DIAGNOSIS — K5730 Diverticulosis of large intestine without perforation or abscess without bleeding: Secondary | ICD-10-CM | POA: Diagnosis not present

## 2021-12-23 DIAGNOSIS — I700 Atherosclerosis of aorta: Secondary | ICD-10-CM | POA: Diagnosis not present

## 2021-12-23 DIAGNOSIS — C50112 Malignant neoplasm of central portion of left female breast: Secondary | ICD-10-CM | POA: Diagnosis not present

## 2021-12-23 DIAGNOSIS — I10 Essential (primary) hypertension: Secondary | ICD-10-CM | POA: Diagnosis not present

## 2021-12-23 DIAGNOSIS — I2510 Atherosclerotic heart disease of native coronary artery without angina pectoris: Secondary | ICD-10-CM | POA: Diagnosis not present

## 2021-12-23 DIAGNOSIS — Z79811 Long term (current) use of aromatase inhibitors: Secondary | ICD-10-CM | POA: Diagnosis not present

## 2021-12-23 DIAGNOSIS — Z79899 Other long term (current) drug therapy: Secondary | ICD-10-CM | POA: Diagnosis not present

## 2021-12-23 DIAGNOSIS — M546 Pain in thoracic spine: Secondary | ICD-10-CM | POA: Diagnosis not present

## 2021-12-23 DIAGNOSIS — Z79810 Long term (current) use of selective estrogen receptor modulators (SERMs): Secondary | ICD-10-CM | POA: Diagnosis not present

## 2021-12-23 DIAGNOSIS — Z79818 Long term (current) use of other agents affecting estrogen receptors and estrogen levels: Secondary | ICD-10-CM | POA: Diagnosis not present

## 2021-12-23 DIAGNOSIS — M810 Age-related osteoporosis without current pathological fracture: Secondary | ICD-10-CM | POA: Diagnosis not present

## 2021-12-23 MED ORDER — FULVESTRANT 250 MG/5ML IM SOSY
500.0000 mg | PREFILLED_SYRINGE | INTRAMUSCULAR | Status: DC
  Administered 2021-12-23: 500 mg via INTRAMUSCULAR

## 2021-12-23 MED ORDER — PALBOCICLIB 75 MG PO TABS
75.0000 mg | ORAL_TABLET | Freq: Every day | ORAL | 1 refills | Status: DC
  Filled 2021-12-23: qty 14, 14d supply, fill #0
  Filled 2022-01-04: qty 14, 21d supply, fill #0

## 2021-12-23 NOTE — Telephone Encounter (Signed)
Oral Oncology Patient Advocate Encounter   Received notification from [facility] [employer] that prior authorization for [facility] is required.   PA submitted on CoverMyMeds Key [id number] Status is pending   Oral Oncology Clinic will continue to follow.  [place] Patient [place] Phone [phone] Fax [phone] 12/23/2021 11:56 AM

## 2021-12-23 NOTE — Progress Notes (Signed)
[place]   [phone] [phone]   Clinic Follow up Note   Patient Care Team: Lajean Manes, MD as PCP - [place], [place], MD as Consulting Physician (General Surgery) Truitt Merle, MD as Consulting Physician (Hematology) [name], MD as Consulting Physician (Radiation Oncology) Sylvan Cheese, NP as Nurse Practitioner (Hematology and Oncology) Karen Kays, NP as Nurse Practitioner (Nurse Practitioner)  Date of Service:  12/23/2021  CHIEF COMPLAINT: f/u of lobular left breast cancer  CURRENT THERAPY:  To start Ibrance  ASSESSMENT & PLAN:  Gloria [name] is a 88 y.o. female with   1. Cancer of the central portion of left female breast, Stage IIB, T2 N1 invasive lobular carcinoma, grade 2, ER+/PR+, HER-2-, local chest wall recurrence in 09/2015   -She was diagnosed in 07/2010 but had chest wall local recurrence in 09/2015. She was on adjuvant tamoxifen for 4.5 years, stopped on her own and developed local recurrence 8 months after she stopped tamoxifen. -She has completed chest wall radiation, however developed local recurrence again and underwent a second chest wall surgery for two soft tissue resection in 06/2016. Unfortunately the surgical margins were positive. Dr. Marlou Starks did not offer more surgery because complete surgical resection was unlikely. Pt agreed.  -She has been on monthly fulvestrant injection starting 06/07/17 due to clinical suspicion for disease progress. Due to lost follow up and miscommunications, she was off treatment 09/26/19-04/29/20. She tolerated well aside from reaction to the injection site. -I personally reviewed the restaging PET scan images on 12/19/21 and discussed with pt and her daughter. It showed new and increasing abdominal retroperitoneal adenopathy.  Given the lobular histology, which commonly metastases to abdomen, I think this is most likely cancer progression.  Given her advanced age, I do  not recommend biopsy to confirm. -I recommend changing treatment to Ibrance, and continue fulvestrant injection.  Potential benefit and side effects of Ibrance, especially cytopenias, risk of infection, fatigue, diarrhea, etc. were discussed with her in detail.  She agrees to proceed.   -Given her advanced age, I recommend start at 75 mg daily on day 1-14 every 21 days  2. Mid-thoracic back pain/itching -present for the last year, worsened lately per pt -on description, she feels it is more itching than pain lately. I advised them to use benadryl cream.   3. HTN, CAD   -She'll continue follow-up with her primary care physician and cardiologist   4. Osteoporosis -Her last DEXA scan in 06/2012 showed osteoporosis -I previously encouraged her to continue calcium and Vit D and to repeat DEXA at her PCP office   5. Goal of care discussion -The patient understands the goal of care is palliative. -she is full code for now       Plan  -Lab and PET scan images reviewed, which unfortunately showed cancer progression -I recommend adding Ibrance to fulvestrant, will start at 75 mg daily on day 1-14 every 21 days, will start on 3/13 -Lab, follow-up and fulvestrant injection in 4 weeks    No problem-specific Assessment & Plan notes found for this encounter.   SUMMARY OF ONCOLOGIC HISTORY: Oncology History Overview Note  Cancer of central portion of left female breast [facility])   Staging form: Breast, AJCC 7th Edition     Pathologic stage from 08/04/2010: Stage IIB (T2, N1a, cM0) - Signed by Truitt Merle, MD on 12/09/2015      Cancer of central portion of left female breast ([place])  08/03/2010 Mammogram  left nipple retraction, and the palpable mass subareolar left breast at the 6:00 position. Ultrasound confirmed the presence of a mass measuring 2.6 x 1.7 x 2.4 cm   08/04/2010 Initial Biopsy   left breast mass biopsy showed an invasive mammary carcinoma with lobular features. ER 95%, PR 97%, Ki-67  17%, HER-2/neu (-)   08/04/2010 Pathologic Stage   Stage IIB: T2 N1a   08/11/2010 Imaging   left breast mass 4.6 x 4.2 x 2.3 cm with enhancement distortion extending to the nipple retraction. There is questionable anterior mediastinal lymph node seen   07/2010 - 09/03/2010 Anti-estrogen oral therapy   neoadjuvant letrozole    09/03/2010 Surgery    left breast mastectomy with sentinel node biopsy on 09/03/2010.   08/2010 - 01/2015 Anti-estrogen oral therapy   Tamoxifen, pt stopped on her own    12/03/2010 - 01/23/2011 Radiation Therapy   left chest wall and axilla radiaiton    10/19/2015 Progression   Left chest wall recurrence, s/p resection on 11/25/2015 with positive margins ([facility])    12/10/2015 - 05/31/2017 Anti-estrogen oral therapy   anastrozole 76m daily, which was switched to Tamoxifen on 06/30/2016 due to persistent disease.   01/03/2016 - 03/03/2016 Radiation Therapy   Radiation to left chest wall recurrence (12/06/2015-02/04/2016 - 44 Gy), pt progressed through the radiation, and had additional 10 fractions of radiation (02/21/16-03/03/2016 - total 70 Gy to the left chest wall)   07/14/2016 PET scan   PET 07/14/2016 IMPRESSION: 1. No findings of recurrent malignancy. 2. Radiation fibrosis in the lingula. A small left axillary lymph node is not hypermetabolic and only 6 mm in diameter. This is in the vicinity of the prior axillary cyst. 3. Pelvic floor laxity.   07/17/2016 Surgery   Chest wall soft tissue resection for local recurrence   07/17/2016 Pathology Results   Left chest wall two soft tissue resection both showed invasive lobular carcinoma, margins were positive. ER 95% positive, PR 60% positive, HER-2 negative.   07/27/2016 Imaging   CT chest, abdomen and pelvis wo contrast  07/27/2016 IMPRESSION: Sigmoid diverticulosis without acute diverticulitis. No bowel obstruction or acute inflammation. Degenerative disc disease and facet arthropathy of the lower lumbar spine with slight  grade 1 anterolisthesis of L4 on L5. Stable appearing simple left-sided renal cysts.   03/01/2017 Mammogram   Mammogram 03/01/2017 IMPRESSION: No mammographic evidence of malignancy. A result letter of this screening mammogram will be mailed directly to the patient.  RECOMMENDATION: Screening mammogram in one year   04/10/2017 Imaging   PET IMPRESSION: 1. Stable exam.  No findings of recurrent malignancy. 2. Changes of external beam radiation again noted within the lingula. 3. Small left axillary lymph node exhibits mild increased uptake. Unchanged from previous exam.   06/07/2017 -  Anti-estrogen oral therapy   Stopped Tamoxifen and Began monthly Faslodex IM injection on 06/07/17  -Held after 09/26/19 due to lost f/u. Restart with loading dose on 04/29/20.    09/19/2017 Imaging   CT CAP WO Contrast 09/19/17 IMPRESSION: 1. Surgical changes from a left mastectomy without CT findings to suggest recurrent chest wall tumor or axillary lymphadenopathy. 2. No findings to suggest metastatic disease involving the neck, chest, abdomen or pelvis or osseous structures. 3. Stable radiation changes involving the lingula.    09/19/2017 Imaging   Bone Scan Whole Body  IMPRESSION: No definite scintigraphic evidence of osseous metastatic disease.  Scattered degenerative type uptake as above.   03/11/2018 PET scan   IMPRESSION:  1. No evidence  of recurrent disease. 2.  Aortic atherosclerosis    09/03/2018 PET scan   09/03/2018 PET Scan IMPRESSION: 1. No findings to suggest locally recurrent disease or metastatic disease in the neck, chest, abdomen or pelvis. 2. Hepatic steatosis. 3. Colonic diverticulosis without evidence of acute diverticulitis at this time. 4. Aortic atherosclerosis. 5. Additional incidental findings, as above.   11/25/2018 Imaging   DG Ribs Unilateral W/Chest Left  IMPRESSION: Stable left lingular scarring. Normal left ribs. No acute cardiopulmonary abnormality seen.     04/04/2019 PET scan   PET  IMPRESSION: 1. No evidence of hypermetabolic recurrent or metastatic disease. 2. Hepatic steatosis. 3.  Aortic Atherosclerosis (ICD10-I70.0).   05/18/2020 PET scan   IMPRESSION: 1. Findings of LEFT mastectomy and axillary dissection with radiation changes, no signs of metastatic disease or disease recurrence at this time. 2. Hepatic steatosis. 3. Aortic atherosclerosis.   Aortic Atherosclerosis (ICD10-I70.0).     11/09/2020 PET scan   IMPRESSION: 1. No findings of hypermetabolic metastatic disease in the neck, chest, abdomen or pelvis. 2. Hepatic steatosis. 3. Aortic atherosclerosis. 4. Increased metabolism about the sphincter complex and lower rectum more likely physiologic; however, consider digital rectal exam and correlation with any symptoms in this location.   05/03/2021 PET scan   IMPRESSION: Status post left mastectomy with left axillary lymph node dissection. Radiation changes in the left upper lobe/lingula.   No evidence of recurrent or metastatic disease.   12/19/2021 PET scan   IMPRESSION: 1. New or increasing abdominal retroperitoneal adenopathy and soft tissue stranding/thickening along the left periaortic station, with mild hypermetabolism. Findings are worrisome for malignancy, possibly unrelated to breast cancer. 2. Left pelvicaliceal dilatation/obstruction with increasing left perinephric stranding. 3. Consider CT or MR abdomen pelvis without and with contrast in further evaluation, as clinically indicated. 4. Aortic atherosclerosis (ICD10-I70.0). Coronary artery calcification.      INTERVAL HISTORY:  Gloria [name] is here for a follow up of breast cancer. She was last seen by me on 10/19/21. She presents to the clinic accompanied by her daughter. They report she and their family had Covid since her last visit (ED visit 10/27/21). She has recovered well, but it did cause her to miss several doses of fulvestrant.   All other  systems were reviewed with the patient and are negative.  MEDICAL HISTORY:  Past Medical History:  Diagnosis Date   Anxiety    Blood in urine    CAD (coronary artery disease)    non obstructive by cath   Cancer [facility])    breast- left   COVID    Depression    Family history of breast cancer    Family history of colon cancer    Family history of ovarian cancer    Family history of pancreatic cancer    Family history of prostate cancer    GERD (gastroesophageal reflux disease)    Hypertension    Knee fracture    Personal history of radiation therapy 2017   TR (tricuspid regurgitation)    mild by Echo 12/2008 EF >55%    SURGICAL HISTORY: Past Surgical History:  Procedure Laterality Date   ABDOMINAL AORTAGRAM N/A 09/08/2011   Procedure: ABDOMINAL Maxcine Ham;  Surgeon: Troy Sine, MD;  Location: [facility] CATH LAB;  Service: Cardiovascular;  Laterality: N/A;   ABDOMINAL HYSTERECTOMY     BREAST EXCISIONAL BIOPSY Left 2017   X3   BREAST SURGERY  08-30-10   mastectomy   CARDIAC CATHETERIZATION  08/2011   20% LAD stenosis,  20% diagonal stenosis, 10-20% proximal dominant RCA stenosis   COLON SURGERY     LEFT HEART CATHETERIZATION WITH CORONARY ANGIOGRAM N/A 09/08/2011   Procedure: LEFT HEART CATHETERIZATION WITH CORONARY ANGIOGRAM;  Surgeon: Troy Sine, MD;  Location: [facility] CATH LAB;  Service: Cardiovascular;  Laterality: N/A;   MASTECTOMY     MINOR BREAST BIOPSY Left 11/25/2015   Procedure: MINOR EXCISION MASS LEFT CHEST WALL;  Surgeon: Autumn Messing III, MD;  Location: [place];  Service: General;  Laterality: Left;   MINOR BREAST BIOPSY Left 07/17/2016   Procedure: EXCISION OF 2 LEFT CHEST WALL MASSES;  Surgeon: Autumn Messing III, MD;  Location: [place];  Service: General;  Laterality: Left;   TONSILLECTOMY      I have reviewed the social history and family history with the patient and they are unchanged from previous note.  ALLERGIES:  is allergic to  contrast media [iodinated contrast media], penicillins, adhesive [tape], cephalexin, ciprofloxacin, demerol, meperidine hcl, sulfa antibiotics, sulfamethoxazole-trimethoprim, and quinolones.  MEDICATIONS:  Current Outpatient Medications  Medication Sig Dispense Refill   palbociclib (IBRANCE) 75 MG tablet Take 1 tablet (75 mg total) by mouth daily. Take for 14 days on, 7 days off, repeat every 21 days. 14 tablet 1   acetaminophen (TYLENOL) 500 MG tablet Take 1,000 mg by mouth daily as needed for moderate pain. Reported on 03/10/2016     ALPRAZolam (XANAX) 0.25 MG tablet Take by mouth.     benzonatate (TESSALON) 100 MG capsule Take 1 capsule (100 mg total) by mouth every 8 (eight) hours. 21 capsule 0   CALCIUM-MAGNESIUM-VITAMIN D PO Take 1 tablet by mouth 2 (two) times daily.     doxepin (SINEQUAN) 10 MG capsule Take 10 mg by mouth at bedtime.     famotidine (PEPCID) 20 MG tablet Take 20 mg by mouth daily.     FLOVENT HFA 44 MCG/ACT inhaler Inhale into the lungs.     fluticasone (FLONASE) 50 MCG/ACT nasal spray Place 1 spray into both nostrils daily as needed for allergies.      guaiFENesin (MUCINEX) 600 MG 12 hr tablet Take 600 mg by mouth daily as needed for cough or to loosen phlegm. Reported on 03/28/2016     hydrochlorothiazide (MICROZIDE) 12.5 MG capsule Take 1 capsule by mouth once daily 90 capsule 0   losartan (COZAAR) 25 MG tablet 25 mg at bedtime.      losartan (COZAAR) 50 MG tablet Take 1 tablet by mouth once daily 90 tablet 1   nitroGLYCERIN (NITROSTAT) 0.4 MG SL tablet Place 1 tablet (0.4 mg total) under the tongue every 5 (five) minutes as needed for chest pain. <PLEASE MAKE APPOINTMENT> 25 tablet 3   pseudoephedrine-acetaminophen (TYLENOL SINUS) 30-500 MG TABS tablet Take 1 tablet by mouth every 4 (four) hours as needed.     No current facility-administered medications for this visit.    PHYSICAL EXAMINATION: ECOG PERFORMANCE STATUS: 2 - Symptomatic, <50% confined to  bed  Vitals:   12/23/21 1103  BP: (!) 144/82  Pulse: (!) 103  Resp: 17  Temp: 98.2 F (36.8 C)  SpO2: 97%   Wt Readings from Last 3 Encounters:  12/23/21 133 lb 9 oz (60.6 kg)  12/14/21 133 lb (60.3 kg)  10/27/21 130 lb (59 kg)     GENERAL:alert, no distress and comfortable SKIN: skin color normal, no rashes or significant lesions EYES: normal, Conjunctiva are pink and non-injected, sclera clear  NEURO: alert & oriented x 3  with fluent speech  LABORATORY DATA:  I have reviewed the data as listed CBC Latest Ref Rng & Units 12/19/2021 10/27/2021 10/19/2021  WBC 4.0 - 10.5 K/uL 3.9(L) 3.2(L) 4.0  Hemoglobin 12.0 - 15.0 g/dL 12.1 12.3 12.3  Hematocrit 36.0 - 46.0 % 37.4 37.8 38.3  Platelets 150 - 400 K/uL 156 177 160     CMP Latest Ref Rng & Units 12/19/2021 10/27/2021 10/19/2021  Glucose 70 - 99 mg/dL 98 103(H) 64(L)  BUN 8 - 23 mg/dL 21 21 24(H)  Creatinine 0.44 - 1.00 mg/dL 1.02(H) 0.89 1.07(H)  Sodium 135 - 145 mmol/L 142 135 142  Potassium 3.5 - 5.1 mmol/L 4.1 4.0 4.2  Chloride 98 - 111 mmol/L 108 101 106  CO2 22 - 32 mmol/L _0 Calcium 8.9 - 10.3 mg/dL 9.3 9.2 9.6  Total Protein 6.5 - 8.1 g/dL 6.4(L) - 6.5  Total Bilirubin 0.3 - 1.2 mg/dL 0.6 - 0.6  Alkaline Phos 38 - 126 U/L 77 - 70  AST 15 - 41 U/L 22 - 21  ALT 0 - 44 U/L 13 - 13      RADIOGRAPHIC STUDIES: I have personally reviewed the radiological images as listed and agreed with the findings in the report. No results found.    No orders of the defined types were placed in this encounter.  All questions were answered. The patient knows to call the clinic with any problems, questions or concerns. No barriers to learning was detected. The total time spent in the appointment was 40 minutes.     Truitt Merle, MD 12/23/2021   I, Wilburn Mylar, am acting as scribe for Truitt Merle, MD.   I have reviewed the above documentation for accuracy and completeness, and I agree with the above.

## 2021-12-26 ENCOUNTER — Other Ambulatory Visit (HOSPITAL_COMMUNITY): Payer: Self-pay

## 2021-12-26 NOTE — Telephone Encounter (Addendum)
Oral Chemotherapy Pharmacist Encounter   Spoke with patient's daughter today, Tilda Burrow, to follow up regarding Gloria [name]'s new oral chemotherapy medication: Ibrance (palbociclib)  Discussed with daughter that out of pocket copay is [phone] and we can attempt to apply for manufacturer assistance through [place] to obtain the drug at no cost. Ms. Efrain Sella stated that her mother is out of town this week, but will be back 01/02/22 and they will come to the cancer center at that time to sign the necessary paperwork and bringing proof of income for the application.   Patient's daughter knows to call the office with questions or concerns. Oral chemotherapy clinic phone number provided.   Leron Croak, PharmD, BCPS Hematology/Oncology Clinical Pharmacist [facility] [place] [phone] 12/26/2021 12:49 PM

## 2021-12-26 NOTE — Telephone Encounter (Signed)
Oral Oncology Pharmacist Encounter  Received new prescription for Ibrance (palbociclib) for the treatment of metastatic ER/PR positive, HER-2 negative breast cancer in conjunction with fulvestrant, planned duration until disease progression or unacceptable drug toxicity.  CBC w/ Diff and CMP from 12/19/21 assessed, no relevant lab abnormalities noted. Prescription dose and frequency assessed for appropriateness - per [name] patient will start on reduced dose and reduced cycle frequency to improve toleration.  Current medication list in Epic reviewed, DDIs with Ibrance identified: Category C DDI between Ibrance and Xanax - Ibrance, a weak CYP3A4 inhibitor may increase serum concentrations of Xanax. Recommend monitoring for increased somnolence/fatigue. No change in therapy recommended at this time.   Evaluated chart and no patient barriers to medication adherence noted.   Patient agreement for treatment documented in MD note on 12/23/21.  Prescription has been e-scribed to the [facility] for benefits analysis and approval.  Oral Oncology Clinic will continue to follow for insurance authorization, copayment issues, initial counseling and start date.  Leron Croak, PharmD, BCPS Hematology/Oncology Clinical Pharmacist [facility] [place] [phone] 12/26/2021 8:18 AM

## 2021-12-26 NOTE — Telephone Encounter (Signed)
Oral Oncology Pharmacist Encounter  Prior Authorization for Ibrance (palbociclib) has been approved.    PA# [id number] Effective dates: 12/23/21 through 12/23/22  Patients co-pay is $2804.92  Due to high copay, will proceed with applying for manufacturer assistance at this time. Oral Oncology Clinic will continue to follow.   Leron Croak, PharmD, BCPS Hematology/Oncology Clinical Pharmacist [facility] [phone] 12/26/2021 8:31 AM

## 2021-12-29 ENCOUNTER — Other Ambulatory Visit: Payer: Self-pay

## 2021-12-29 ENCOUNTER — Telehealth: Payer: Self-pay

## 2021-12-29 ENCOUNTER — Other Ambulatory Visit: Payer: Self-pay | Admitting: Hematology

## 2021-12-29 MED ORDER — ONDANSETRON HCL 4 MG PO TABS: 4.0000 mg | ORAL_TABLET | Freq: Three times a day (TID) | ORAL | 0 refills | Status: DC | PRN

## 2021-12-29 NOTE — Telephone Encounter (Signed)
Pt's daughter called stating that her mom is very nauseous x4 days (started Sunday) and diarrhea x3 days.  Pt's daughter thinks the pt is have or had a "stomach bug" but is still c/o nausea.  Pt's daughter stated her mom took imodium AD which helped the diarrhea but the nausea continues to be a problem.  Pt's daughter would like something for nausea.  Informed pt's daughter that this RN will notify [name] of pt's complaint but in the interim please incorporate Ginger in the pt's diet to help with nausea.  Pt's daughter verbalized understanding of instructions.  Forwarded message to [name]. ?

## 2021-12-29 NOTE — Progress Notes (Signed)
[name] sent in a prescription for Zofran 4mg  PO for pt.  [name] sent prescription to pt's preferred pharmacy. ?

## 2022-01-02 ENCOUNTER — Telehealth: Payer: Self-pay

## 2022-01-02 NOTE — Telephone Encounter (Signed)
Called pt and spoke with pt's daughter regarding pt's c/o of nausea.  [name] ordered Zofran last week for pt.  Following up w/pt to see if nausea has gotten better.  Pt's daughter stated that pt will not take Zofran because she said the Tylenol is helping.  Asked pt's daughter is the pt experiencing pain instead of nausea.  Pt's daughter stated the pt is having nausea but is just set on taking the Tylenol.  Pt's daughter stated she told her mom to just take the Zofran today and to not take Tylenol today just to see if the Zofran will help with the nausea.  Pt's daughter stated her mom was in agreement with taking the Zofran for today.  Pt's daughter requested if [name] could please give she and her brother a telephone call regarding the pt sometime this week.  Pt's daughter stated she would like to speak with [name] before her mom starts her oral chemotherapy medication Leslee Home) on 01/20/2022.  Informed pt's daughter that this RN will notify [name] of her request. ?

## 2022-01-03 ENCOUNTER — Other Ambulatory Visit (HOSPITAL_COMMUNITY): Payer: Self-pay

## 2022-01-03 ENCOUNTER — Encounter: Payer: Self-pay | Admitting: Hematology

## 2022-01-03 ENCOUNTER — Other Ambulatory Visit: Payer: Self-pay

## 2022-01-04 ENCOUNTER — Other Ambulatory Visit (HOSPITAL_COMMUNITY): Payer: Self-pay

## 2022-01-04 ENCOUNTER — Telehealth: Payer: Self-pay

## 2022-01-04 NOTE — Telephone Encounter (Signed)
Oral Oncology Patient Advocate Encounter ? ?Met patient in [place] to complete application for [employer] in an effort to reduce patient's out of pocket expense for Ibrance to $0.   ? ?Application completed and faxed to [phone].  ? ?[employer] patient assistance phone number for follow up is [phone].  ? ?This encounter will be updated until final determination.  ? ?Wynn Maudlin CPHT ?Specialty Pharmacy Patient Advocate ?[place] ?Phone [phone] ?Fax [phone] ?01/04/2022 9:55 AM ? ?

## 2022-01-06 NOTE — Telephone Encounter (Signed)
Oral Chemotherapy Pharmacist Encounter ? ?I spoke with patient for overview of: Ibrance for the treatment of metastatic ER/PR positive, HER-2 negative breast cancer in conjunction with fulvestrant, planned duration until disease progression or unacceptable drug toxicity. ? ?Counseled on administration, dosing, side effects, monitoring, drug-food interactions, safe handling, storage, and disposal. ? ?Patient will take Ibrance 52m tablets, 1 tablet by mouth once daily, with or without food, taken for 14 day on, 7 days off, and repeated. Patient on low dose and decreased cycle frequency to improve tolerance to medication.  ? ?Patient knows to avoid grapefruit and grapefruit juice while on treatment with Ibrance. ? ?ILeslee Homestart date: 01/09/22 ? ?Adverse effects include but are not limited to: fatigue, hair loss, GI upset, nausea, decreased blood counts. ?Severe, life-threatening, and/or fatal interstitial lung disease (ILD) and/or pneumonitis may occur with CDK 4/6 inhibitors. ? ?Patient will obtain anti diarrheal and alert the office of 4 or more loose stools above baseline. ? ?Reviewed importance of keeping a medication schedule and plan for any missed doses. No barriers to medication adherence identified. ? ?Medication reconciliation performed and medication/allergy list updated. ? ?IShip brokerfor ILeslee Homehas been obtained. Free trial card utilized for first fill of Ibrance while manufacturer assistance is pending through [employer]  ? ?All questions answered. Patient's daughter voiced understanding and appreciation.  ? ?Medication education handout and medication calendar placed in mail for patient and family. Patient and patient's family know to call the office with questions or concerns. Oral Chemotherapy Clinic phone number provided.  ? ?RLeron Croak PharmD, BCPS ?Hematology/Oncology Clinical Pharmacist ?[facility] ?[phone]?01/06/2022 11:04  AM ? ?

## 2022-01-11 ENCOUNTER — Other Ambulatory Visit: Payer: Self-pay | Admitting: Adult Health

## 2022-01-20 ENCOUNTER — Other Ambulatory Visit: Payer: Self-pay

## 2022-01-20 ENCOUNTER — Inpatient Hospital Stay: Payer: PPO | Attending: Hematology

## 2022-01-20 ENCOUNTER — Inpatient Hospital Stay (HOSPITAL_BASED_OUTPATIENT_CLINIC_OR_DEPARTMENT_OTHER): Payer: PPO | Admitting: Hematology

## 2022-01-20 ENCOUNTER — Inpatient Hospital Stay: Payer: PPO

## 2022-01-20 VITALS — BP 133/76 | HR 89 | Temp 97.4°F | Resp 20 | Ht 61.0 in | Wt 130.6 lb

## 2022-01-20 DIAGNOSIS — Z923 Personal history of irradiation: Secondary | ICD-10-CM | POA: Insufficient documentation

## 2022-01-20 DIAGNOSIS — Z9012 Acquired absence of left breast and nipple: Secondary | ICD-10-CM | POA: Diagnosis not present

## 2022-01-20 DIAGNOSIS — Z800 Family history of malignant neoplasm of digestive organs: Secondary | ICD-10-CM | POA: Diagnosis not present

## 2022-01-20 DIAGNOSIS — I2510 Atherosclerotic heart disease of native coronary artery without angina pectoris: Secondary | ICD-10-CM | POA: Diagnosis not present

## 2022-01-20 DIAGNOSIS — Z170 Estrogen receptor positive status [ER+]: Secondary | ICD-10-CM | POA: Diagnosis not present

## 2022-01-20 DIAGNOSIS — K219 Gastro-esophageal reflux disease without esophagitis: Secondary | ICD-10-CM | POA: Insufficient documentation

## 2022-01-20 DIAGNOSIS — C50112 Malignant neoplasm of central portion of left female breast: Secondary | ICD-10-CM | POA: Diagnosis not present

## 2022-01-20 DIAGNOSIS — I10 Essential (primary) hypertension: Secondary | ICD-10-CM | POA: Insufficient documentation

## 2022-01-20 DIAGNOSIS — Z79818 Long term (current) use of other agents affecting estrogen receptors and estrogen levels: Secondary | ICD-10-CM | POA: Insufficient documentation

## 2022-01-20 DIAGNOSIS — K760 Fatty (change of) liver, not elsewhere classified: Secondary | ICD-10-CM | POA: Insufficient documentation

## 2022-01-20 DIAGNOSIS — Z803 Family history of malignant neoplasm of breast: Secondary | ICD-10-CM | POA: Diagnosis not present

## 2022-01-20 DIAGNOSIS — I700 Atherosclerosis of aorta: Secondary | ICD-10-CM | POA: Diagnosis not present

## 2022-01-20 DIAGNOSIS — M810 Age-related osteoporosis without current pathological fracture: Secondary | ICD-10-CM | POA: Insufficient documentation

## 2022-01-20 DIAGNOSIS — Z79899 Other long term (current) drug therapy: Secondary | ICD-10-CM | POA: Diagnosis not present

## 2022-01-20 DIAGNOSIS — Z79811 Long term (current) use of aromatase inhibitors: Secondary | ICD-10-CM | POA: Insufficient documentation

## 2022-01-20 LAB — CBC WITH DIFFERENTIAL/PLATELET
Abs Immature Granulocytes: 0.02 10*3/uL (ref 0.00–0.07)
Basophils Absolute: 0 10*3/uL (ref 0.0–0.1)
Basophils Relative: 2 %
Eosinophils Absolute: 0.1 10*3/uL (ref 0.0–0.5)
Eosinophils Relative: 7 %
HCT: 34.6 % — ABNORMAL LOW (ref 36.0–46.0)
Hemoglobin: 11.5 g/dL — ABNORMAL LOW (ref 12.0–15.0)
Immature Granulocytes: 1 %
Lymphocytes Relative: 26 %
Lymphs Abs: 0.5 10*3/uL — ABNORMAL LOW (ref 0.7–4.0)
MCH: 31.6 pg (ref 26.0–34.0)
MCHC: 33.2 g/dL (ref 30.0–36.0)
MCV: 95.1 fL (ref 80.0–100.0)
Monocytes Absolute: 0.1 10*3/uL (ref 0.1–1.0)
Monocytes Relative: 5 %
Neutro Abs: 1.2 10*3/uL — ABNORMAL LOW (ref 1.7–7.7)
Neutrophils Relative %: 59 %
Platelets: 149 10*3/uL — ABNORMAL LOW (ref 150–400)
RBC: 3.64 MIL/uL — ABNORMAL LOW (ref 3.87–5.11)
RDW: 13.2 % (ref 11.5–15.5)
WBC: 2 10*3/uL — ABNORMAL LOW (ref 4.0–10.5)
nRBC: 0 % (ref 0.0–0.2)

## 2022-01-20 LAB — COMPREHENSIVE METABOLIC PANEL
ALT: 13 U/L (ref 0–44)
AST: 20 U/L (ref 15–41)
Albumin: 3.8 g/dL (ref 3.5–5.0)
Alkaline Phosphatase: 68 U/L (ref 38–126)
Anion gap: 7 (ref 5–15)
BUN: 23 mg/dL (ref 8–23)
CO2: 26 mmol/L (ref 22–32)
Calcium: 9.1 mg/dL (ref 8.9–10.3)
Chloride: 108 mmol/L (ref 98–111)
Creatinine, Ser: 1.28 mg/dL — ABNORMAL HIGH (ref 0.44–1.00)
GFR, Estimated: 40 mL/min — ABNORMAL LOW (ref >60)
Glucose, Bld: 88 mg/dL (ref 70–99)
Potassium: 3.9 mmol/L (ref 3.5–5.1)
Sodium: 141 mmol/L (ref 135–145)
Total Bilirubin: 0.5 mg/dL (ref 0.3–1.2)
Total Protein: 6.2 g/dL — ABNORMAL LOW (ref 6.5–8.1)

## 2022-01-20 MED ORDER — FULVESTRANT 250 MG/5ML IM SOSY
500.0000 mg | PREFILLED_SYRINGE | INTRAMUSCULAR | Status: DC
  Administered 2022-01-20: 500 mg via INTRAMUSCULAR
  Filled 2022-01-20: qty 10

## 2022-01-20 NOTE — Progress Notes (Signed)
?[place]   ?Telephone:(336) [phone] [phone]   ?Clinic Follow up Note  ? ?Patient Care Team: ?Lajean Manes, MD as PCP - General ?Jovita Kussmaul, MD as Consulting Physician (General Surgery) ?Truitt Merle, MD as Consulting Physician (Hematology) ?[name], MD as Consulting Physician (Radiation Oncology) ?Sylvan Cheese, NP as Nurse Practitioner (Hematology and Oncology) ?Karen Kays, NP as Nurse Practitioner (Nurse Practitioner) ? ?Date of Service:  01/20/2022 ? ?CHIEF COMPLAINT: f/u of lobular left breast cancer ? ?CURRENT THERAPY:  ?-Faslodex injection, monthly, started 06/07/17. Held 09/26/19-04/29/20 ?-Ibrance 50m, 2 weeks on/1 week off starting 01/09/22 ? ?ASSESSMENT & PLAN:  ?Gloria [name] a 88y.o. female with  ? ?1. Cancer of the central portion of left female breast, Stage IIB, T2 N1 invasive lobular carcinoma, grade 2, ER+/PR+, HER-2-, local chest wall recurrence in 09/2015   ?-She was diagnosed in 07/2010 but had chest wall local recurrence in 09/2015. She was on adjuvant tamoxifen for 4.5 years, stopped on her own and developed local recurrence 8 months after she stopped tamoxifen. ?-She has completed chest wall radiation, however developed local recurrence again and underwent a second chest wall surgery for two soft tissue resection in 06/2016. Unfortunately the surgical margins were positive. Dr. TMarlou Starksdid not offer more surgery because complete surgical resection was unlikely. Pt agreed.  ?-She has been on monthly fulvestrant injection starting 06/07/17 due to clinical suspicion for disease progress. Due to lost follow up and miscommunications, she was off treatment 09/26/19-04/29/20. She tolerated well aside from reaction to the injection site. ?-PET scan on 12/19/21 showed new and increasing abdominal retroperitoneal adenopathy.  Given the lobular histology, which commonly metastases to abdomen, I think this is most likely cancer progression.  ?-she  started Ibrance 75 mg on 01/09/22. They expressed concern about cost. I have our pharmacists working on her Ibrance replacement. ?-I reviewed that she will continue the fulvestrant injections as she has been. Labs reviewed, creatinine is slightly elevated to 1.28, likely from dehydration.  I encouraged her to drink more water.  We will repeat this next week. Otherwise, she will continue Ibrance and will finish this cycle on Sunday. ?  ?2. Mid-thoracic back pain/itching ?-present for the last year, worsened lately per pt ?-on description, she feels it is more itching than pain lately. I advised them to use benadryl cream. ?-no mentioned 01/20/22 ?  ?3. HTN, CAD   ?-She'll continue follow-up with her primary care physician and cardiologist ?  ?4. Osteoporosis ?-Her last DEXA scan in 06/2012 showed osteoporosis ?-I previously encouraged her to continue calcium and Vit D and to repeat DEXA at her PCP office ?  ?5. Goal of care discussion ?-The patient understands the goal of care is palliative. ?-she is full code for now   ?  ?  ?Plan  ?-proceed with fulvestrant injection today and every 4 weeks ?-continue Ibrance through 3/26, then off for a week ?-lab to recheck creatinine in 1 week, if improves, then start cycle 2 Ibrance on 4/3 ?-lab and fulvestrant injection in 4 weeks ? ? ?No problem-specific Assessment & Plan notes found for this encounter. ? ? ?SUMMARY OF ONCOLOGIC HISTORY: ?Oncology History Overview Note  ?Cancer of central portion of left female breast ([facility] ?  Staging form: Breast, AJCC 7th Edition ?    Pathologic stage from 08/04/2010: Stage IIB (T2, N1a, cM0) - Signed by YTruitt Merle MD on 12/09/2015 ? ?  ?  ?Cancer of central portion of left  female breast [facility])  ?08/03/2010 Mammogram  ?  left nipple retraction, and the palpable mass subareolar left breast at the 6:00 position. Ultrasound confirmed the presence of a mass measuring 2.6 x 1.7 x 2.4 cm ?  ?08/04/2010 Initial Biopsy  ? left breast mass biopsy showed an  invasive mammary carcinoma with lobular features. ER 95%, PR 97%, Ki-67 17%, HER-2/neu (-) ?  ?08/04/2010 Pathologic Stage  ? Stage IIB: T2 N1a ?  ?08/11/2010 Imaging  ? left breast mass 4.6 x 4.2 x 2.3 cm with enhancement distortion extending to the nipple retraction. There is questionable anterior mediastinal lymph node seen ?  ?07/2010 - 09/03/2010 Anti-estrogen oral therapy  ? neoadjuvant letrozole  ?  ?09/03/2010 Surgery  ?  left breast mastectomy with sentinel node biopsy on 09/03/2010. ?  ?08/2010 - 01/2015 Anti-estrogen oral therapy  ? Tamoxifen, pt stopped on her own  ?  ?12/03/2010 - 01/23/2011 Radiation Therapy  ? left chest wall and axilla radiaiton  ?  ?10/19/2015 Progression  ? Left chest wall recurrence, s/p resection on 11/25/2015 with positive margins ([place])  ?  ?12/10/2015 - 05/31/2017 Anti-estrogen oral therapy  ? anastrozole 22m daily, which was switched to Tamoxifen on 06/30/2016 due to persistent disease. ?  ?01/03/2016 - 03/03/2016 Radiation Therapy  ? Radiation to left chest wall recurrence (12/06/2015-02/04/2016 - 44 Gy), pt progressed through the radiation, and had additional 10 fractions of radiation (02/21/16-03/03/2016 - total 70 Gy to the left chest wall) ?  ?07/14/2016 PET scan  ? PET 07/14/2016 ?IMPRESSION: ?1. No findings of recurrent malignancy. ?2. Radiation fibrosis in the lingula. A small left axillary lymph ?node is not hypermetabolic and only 6 mm in diameter. This is in the ?vicinity of the prior axillary cyst. ?3. Pelvic floor laxity. ?  ?07/17/2016 Surgery  ? Chest wall soft tissue resection for local recurrence ?  ?07/17/2016 Pathology Results  ? Left chest wall two soft tissue resection both showed invasive lobular carcinoma, margins were positive. ER 95% positive, PR 60% positive, HER-2 negative. ?  ?07/27/2016 Imaging  ? CT chest, abdomen and pelvis wo contrast  07/27/2016 ?IMPRESSION: ?Sigmoid diverticulosis without acute diverticulitis. No bowel obstruction or acute inflammation. ?Degenerative disc  disease and facet arthropathy of the lower lumbar spine with slight grade 1 anterolisthesis of L4 on L5. ?Stable appearing simple left-sided renal cysts. ?  ?03/01/2017 Mammogram  ? Mammogram 03/01/2017 ?IMPRESSION: ?No mammographic evidence of malignancy. A result letter of this ?screening mammogram will be mailed directly to the patient. ? RECOMMENDATION: ?Screening mammogram in one year ?  ?04/10/2017 Imaging  ? PET IMPRESSION: ?1. Stable exam.  No findings of recurrent malignancy. ?2. Changes of external beam radiation again noted within the ?lingula. ?3. Small left axillary lymph node exhibits mild increased uptake. ?Unchanged from previous exam. ?  ?06/07/2017 -  Anti-estrogen oral therapy  ? Stopped Tamoxifen and Began monthly Faslodex IM injection on 06/07/17  ?-Held after 09/26/19 due to lost f/u. Restart with loading dose on 04/29/20.  ?  ?09/19/2017 Imaging  ? CT CAP WO Contrast 09/19/17 ?IMPRESSION: ?1. Surgical changes from a left mastectomy without CT findings to ?suggest recurrent chest wall tumor or axillary lymphadenopathy. ?2. No findings to suggest metastatic disease involving the neck, ?chest, abdomen or pelvis or osseous structures. ?3. Stable radiation changes involving the lingula.  ?  ?09/19/2017 Imaging  ? Bone Scan Whole Body  ?IMPRESSION: ?No definite scintigraphic evidence of osseous metastatic disease. ? Scattered degenerative type uptake as above. ?  ?03/11/2018  PET scan  ? IMPRESSION:  ?1. No evidence of recurrent disease. ?2.  Aortic atherosclerosis  ?  ?09/03/2018 PET scan  ? 09/03/2018 PET Scan ?IMPRESSION: ?1. No findings to suggest locally recurrent disease or metastatic ?disease in the neck, chest, abdomen or pelvis. ?2. Hepatic steatosis. ?3. Colonic diverticulosis without evidence of acute diverticulitis ?at this time. ?4. Aortic atherosclerosis. ?5. Additional incidental findings, as above. ?  ?11/25/2018 Imaging  ? DG Ribs Unilateral W/Chest Left  ?IMPRESSION: ?Stable left lingular  scarring. Normal left ribs. No acute ?cardiopulmonary abnormality seen. ? ?  ?04/04/2019 PET scan  ? PET  ?IMPRESSION: ?1. No evidence of hypermetabolic recurrent or metastatic disease. ?2. Hepatic steatosis. ?3.  Aort

## 2022-01-21 ENCOUNTER — Encounter: Payer: Self-pay | Admitting: Hematology

## 2022-01-23 ENCOUNTER — Telehealth: Payer: Self-pay | Admitting: Hematology

## 2022-01-23 NOTE — Telephone Encounter (Signed)
Scheduled per 3/24 los, pt has been called and confirmed  ?

## 2022-01-23 NOTE — Telephone Encounter (Signed)
Patient is approved for Ibrance at no cost from [employer] 01/20/22-10/29/22 ? ?[place]  uses [facility]  ? ?[address] [facility] ?[facility] ?[place] ?Phone [phone] ?Fax [phone] ?01/23/2022 9:13 AM ? ?

## 2022-01-26 ENCOUNTER — Other Ambulatory Visit: Payer: Self-pay

## 2022-01-26 DIAGNOSIS — C50112 Malignant neoplasm of central portion of left female breast: Secondary | ICD-10-CM

## 2022-01-27 ENCOUNTER — Inpatient Hospital Stay: Payer: PPO

## 2022-01-27 ENCOUNTER — Other Ambulatory Visit: Payer: Self-pay

## 2022-01-27 DIAGNOSIS — C50112 Malignant neoplasm of central portion of left female breast: Secondary | ICD-10-CM

## 2022-01-27 LAB — CBC WITH DIFFERENTIAL (CANCER CENTER ONLY)
Abs Immature Granulocytes: 0 10*3/uL (ref 0.00–0.07)
Basophils Absolute: 0 10*3/uL (ref 0.0–0.1)
Basophils Relative: 2 %
Eosinophils Absolute: 0.1 10*3/uL (ref 0.0–0.5)
Eosinophils Relative: 5 %
HCT: 33 % — ABNORMAL LOW (ref 36.0–46.0)
Hemoglobin: 10.9 g/dL — ABNORMAL LOW (ref 12.0–15.0)
Immature Granulocytes: 0 %
Lymphocytes Relative: 30 %
Lymphs Abs: 0.5 10*3/uL — ABNORMAL LOW (ref 0.7–4.0)
MCH: 31.8 pg (ref 26.0–34.0)
MCHC: 33 g/dL (ref 30.0–36.0)
MCV: 96.2 fL (ref 80.0–100.0)
Monocytes Absolute: 0.2 10*3/uL (ref 0.1–1.0)
Monocytes Relative: 13 %
Neutro Abs: 0.8 10*3/uL — ABNORMAL LOW (ref 1.7–7.7)
Neutrophils Relative %: 50 %
Platelet Count: 77 10*3/uL — ABNORMAL LOW (ref 150–400)
RBC: 3.43 MIL/uL — ABNORMAL LOW (ref 3.87–5.11)
RDW: 14 % (ref 11.5–15.5)
WBC Count: 1.5 10*3/uL — ABNORMAL LOW (ref 4.0–10.5)
nRBC: 0 % (ref 0.0–0.2)

## 2022-01-27 LAB — CMP (CANCER CENTER ONLY)
ALT: 13 U/L (ref 0–44)
AST: 20 U/L (ref 15–41)
Albumin: 3.8 g/dL (ref 3.5–5.0)
Alkaline Phosphatase: 59 U/L (ref 38–126)
Anion gap: 6 (ref 5–15)
BUN: 18 mg/dL (ref 8–23)
CO2: 28 mmol/L (ref 22–32)
Calcium: 9.1 mg/dL (ref 8.9–10.3)
Chloride: 106 mmol/L (ref 98–111)
Creatinine: 0.96 mg/dL (ref 0.44–1.00)
GFR, Estimated: 56 mL/min — ABNORMAL LOW (ref >60)
Glucose, Bld: 79 mg/dL (ref 70–99)
Potassium: 4 mmol/L (ref 3.5–5.1)
Sodium: 140 mmol/L (ref 135–145)
Total Bilirubin: 0.6 mg/dL (ref 0.3–1.2)
Total Protein: 6.2 g/dL — ABNORMAL LOW (ref 6.5–8.1)

## 2022-01-31 ENCOUNTER — Telehealth: Payer: Self-pay

## 2022-01-31 NOTE — Telephone Encounter (Signed)
Spoke with pt's daughter [name] via telephone regarding pt's recent lab results.  [name] stated they no longer know how to get into the pt's MyChart account; therefore, this RN told [name] that the pt's Cr+ is WNL.  Informed Sandy that the pt's ANC and Plts were still low; therefore, [name] would like for the pt to remain off the [facility] for now.  Sandy verbalized understanding of instructions.  Informed [name] that the pt has a lab appt on 02/03/2022 at 1:30pm and based off the lab results on Friday 02/03/2022, [name] will determine whether or not the pt will restart Ibrance on 02/06/2022.  [name] office will contact [place] regarding whether to start [place].  Sandy verbalized understanding of instructions and had no further questions at this time. ?

## 2022-02-03 ENCOUNTER — Inpatient Hospital Stay: Payer: PPO | Attending: Hematology

## 2022-02-03 ENCOUNTER — Other Ambulatory Visit: Payer: Self-pay

## 2022-02-03 DIAGNOSIS — Z79818 Long term (current) use of other agents affecting estrogen receptors and estrogen levels: Secondary | ICD-10-CM | POA: Insufficient documentation

## 2022-02-03 DIAGNOSIS — Z170 Estrogen receptor positive status [ER+]: Secondary | ICD-10-CM | POA: Insufficient documentation

## 2022-02-03 DIAGNOSIS — Z8041 Family history of malignant neoplasm of ovary: Secondary | ICD-10-CM | POA: Insufficient documentation

## 2022-02-03 DIAGNOSIS — Z79899 Other long term (current) drug therapy: Secondary | ICD-10-CM | POA: Insufficient documentation

## 2022-02-03 DIAGNOSIS — K5730 Diverticulosis of large intestine without perforation or abscess without bleeding: Secondary | ICD-10-CM | POA: Diagnosis not present

## 2022-02-03 DIAGNOSIS — Z923 Personal history of irradiation: Secondary | ICD-10-CM | POA: Diagnosis not present

## 2022-02-03 DIAGNOSIS — Z803 Family history of malignant neoplasm of breast: Secondary | ICD-10-CM | POA: Insufficient documentation

## 2022-02-03 DIAGNOSIS — K760 Fatty (change of) liver, not elsewhere classified: Secondary | ICD-10-CM | POA: Diagnosis not present

## 2022-02-03 DIAGNOSIS — Z9012 Acquired absence of left breast and nipple: Secondary | ICD-10-CM | POA: Insufficient documentation

## 2022-02-03 DIAGNOSIS — Z8616 Personal history of COVID-19: Secondary | ICD-10-CM | POA: Insufficient documentation

## 2022-02-03 DIAGNOSIS — C50112 Malignant neoplasm of central portion of left female breast: Secondary | ICD-10-CM | POA: Diagnosis not present

## 2022-02-03 DIAGNOSIS — Z8042 Family history of malignant neoplasm of prostate: Secondary | ICD-10-CM | POA: Diagnosis not present

## 2022-02-03 DIAGNOSIS — Z800 Family history of malignant neoplasm of digestive organs: Secondary | ICD-10-CM | POA: Insufficient documentation

## 2022-02-03 DIAGNOSIS — I700 Atherosclerosis of aorta: Secondary | ICD-10-CM | POA: Insufficient documentation

## 2022-02-03 DIAGNOSIS — R590 Localized enlarged lymph nodes: Secondary | ICD-10-CM | POA: Insufficient documentation

## 2022-02-03 DIAGNOSIS — I10 Essential (primary) hypertension: Secondary | ICD-10-CM | POA: Diagnosis not present

## 2022-02-03 DIAGNOSIS — K219 Gastro-esophageal reflux disease without esophagitis: Secondary | ICD-10-CM | POA: Diagnosis not present

## 2022-02-03 DIAGNOSIS — I2510 Atherosclerotic heart disease of native coronary artery without angina pectoris: Secondary | ICD-10-CM | POA: Insufficient documentation

## 2022-02-03 DIAGNOSIS — C50211 Malignant neoplasm of upper-inner quadrant of right female breast: Secondary | ICD-10-CM | POA: Insufficient documentation

## 2022-02-03 DIAGNOSIS — M810 Age-related osteoporosis without current pathological fracture: Secondary | ICD-10-CM | POA: Insufficient documentation

## 2022-02-03 LAB — CBC WITH DIFFERENTIAL (CANCER CENTER ONLY)
Abs Immature Granulocytes: 0 10*3/uL (ref 0.00–0.07)
Basophils Absolute: 0.1 10*3/uL (ref 0.0–0.1)
Basophils Relative: 2 %
Eosinophils Absolute: 0.2 10*3/uL (ref 0.0–0.5)
Eosinophils Relative: 6 %
HCT: 34.7 % — ABNORMAL LOW (ref 36.0–46.0)
Hemoglobin: 11.4 g/dL — ABNORMAL LOW (ref 12.0–15.0)
Immature Granulocytes: 0 %
Lymphocytes Relative: 27 %
Lymphs Abs: 0.7 10*3/uL (ref 0.7–4.0)
MCH: 31.9 pg (ref 26.0–34.0)
MCHC: 32.9 g/dL (ref 30.0–36.0)
MCV: 97.2 fL (ref 80.0–100.0)
Monocytes Absolute: 0.3 10*3/uL (ref 0.1–1.0)
Monocytes Relative: 12 %
Neutro Abs: 1.4 10*3/uL — ABNORMAL LOW (ref 1.7–7.7)
Neutrophils Relative %: 53 %
Platelet Count: 185 10*3/uL (ref 150–400)
RBC: 3.57 MIL/uL — ABNORMAL LOW (ref 3.87–5.11)
RDW: 15.2 % (ref 11.5–15.5)
WBC Count: 2.7 10*3/uL — ABNORMAL LOW (ref 4.0–10.5)
nRBC: 0 % (ref 0.0–0.2)

## 2022-02-03 LAB — CMP (CANCER CENTER ONLY)
ALT: 13 U/L (ref 0–44)
AST: 21 U/L (ref 15–41)
Albumin: 4 g/dL (ref 3.5–5.0)
Alkaline Phosphatase: 65 U/L (ref 38–126)
Anion gap: 9 (ref 5–15)
BUN: 20 mg/dL (ref 8–23)
CO2: 27 mmol/L (ref 22–32)
Calcium: 9.1 mg/dL (ref 8.9–10.3)
Chloride: 104 mmol/L (ref 98–111)
Creatinine: 1.14 mg/dL — ABNORMAL HIGH (ref 0.44–1.00)
GFR, Estimated: 45 mL/min — ABNORMAL LOW (ref >60)
Glucose, Bld: 94 mg/dL (ref 70–99)
Potassium: 3.5 mmol/L (ref 3.5–5.1)
Sodium: 140 mmol/L (ref 135–145)
Total Bilirubin: 0.5 mg/dL (ref 0.3–1.2)
Total Protein: 6.7 g/dL (ref 6.5–8.1)

## 2022-02-07 ENCOUNTER — Telehealth: Payer: Self-pay

## 2022-02-07 NOTE — Telephone Encounter (Addendum)
This nurse spoke with patients daughter and advised per provider that patient can now start [place]. Advised that patients blood counts have recovered well.  MD will most likely change the order to one week on and one week off.  Daughter acknowledged understanding and no further questions or concerns at this time.   ?

## 2022-02-10 ENCOUNTER — Ambulatory Visit: Payer: PPO | Admitting: Podiatry

## 2022-02-10 ENCOUNTER — Ambulatory Visit (INDEPENDENT_AMBULATORY_CARE_PROVIDER_SITE_OTHER): Payer: PPO

## 2022-02-10 DIAGNOSIS — M79675 Pain in left toe(s): Secondary | ICD-10-CM | POA: Diagnosis not present

## 2022-02-10 DIAGNOSIS — L600 Ingrowing nail: Secondary | ICD-10-CM | POA: Diagnosis not present

## 2022-02-10 DIAGNOSIS — L539 Erythematous condition, unspecified: Secondary | ICD-10-CM

## 2022-02-10 DIAGNOSIS — I739 Peripheral vascular disease, unspecified: Secondary | ICD-10-CM

## 2022-02-10 MED ORDER — DOXYCYCLINE HYCLATE 100 MG PO TABS: 100.0000 mg | ORAL_TABLET | Freq: Two times a day (BID) | ORAL | 0 refills | Status: DC

## 2022-02-10 MED ORDER — MUPIROCIN 2 % EX OINT: 1.0000 "application " | TOPICAL_OINTMENT | Freq: Two times a day (BID) | CUTANEOUS | 2 refills | Status: DC

## 2022-02-12 ENCOUNTER — Other Ambulatory Visit: Payer: Self-pay | Admitting: Hematology

## 2022-02-12 DIAGNOSIS — C50112 Malignant neoplasm of central portion of left female breast: Secondary | ICD-10-CM

## 2022-02-13 ENCOUNTER — Inpatient Hospital Stay (HOSPITAL_BASED_OUTPATIENT_CLINIC_OR_DEPARTMENT_OTHER): Payer: PPO | Admitting: Hematology

## 2022-02-13 ENCOUNTER — Other Ambulatory Visit: Payer: Self-pay

## 2022-02-13 ENCOUNTER — Inpatient Hospital Stay: Payer: PPO

## 2022-02-13 ENCOUNTER — Encounter: Payer: Self-pay | Admitting: Hematology

## 2022-02-13 VITALS — BP 129/74 | HR 80 | Temp 98.9°F | Resp 18 | Ht 61.0 in | Wt 131.8 lb

## 2022-02-13 DIAGNOSIS — Z170 Estrogen receptor positive status [ER+]: Secondary | ICD-10-CM

## 2022-02-13 DIAGNOSIS — C50112 Malignant neoplasm of central portion of left female breast: Secondary | ICD-10-CM

## 2022-02-13 LAB — CBC WITH DIFFERENTIAL (CANCER CENTER ONLY)
Abs Immature Granulocytes: 0.01 10*3/uL (ref 0.00–0.07)
Basophils Absolute: 0.1 10*3/uL (ref 0.0–0.1)
Basophils Relative: 2 %
Eosinophils Absolute: 0.3 10*3/uL (ref 0.0–0.5)
Eosinophils Relative: 6 %
HCT: 33.7 % — ABNORMAL LOW (ref 36.0–46.0)
Hemoglobin: 11.1 g/dL — ABNORMAL LOW (ref 12.0–15.0)
Immature Granulocytes: 0 %
Lymphocytes Relative: 19 %
Lymphs Abs: 0.8 10*3/uL (ref 0.7–4.0)
MCH: 32.1 pg (ref 26.0–34.0)
MCHC: 32.9 g/dL (ref 30.0–36.0)
MCV: 97.4 fL (ref 80.0–100.0)
Monocytes Absolute: 0.5 10*3/uL (ref 0.1–1.0)
Monocytes Relative: 11 %
Neutro Abs: 2.6 10*3/uL (ref 1.7–7.7)
Neutrophils Relative %: 62 %
Platelet Count: 230 10*3/uL (ref 150–400)
RBC: 3.46 MIL/uL — ABNORMAL LOW (ref 3.87–5.11)
RDW: 15.5 % (ref 11.5–15.5)
WBC Count: 4.2 10*3/uL (ref 4.0–10.5)
nRBC: 0 % (ref 0.0–0.2)

## 2022-02-13 LAB — CMP (CANCER CENTER ONLY)
ALT: 12 U/L (ref 0–44)
AST: 20 U/L (ref 15–41)
Albumin: 3.9 g/dL (ref 3.5–5.0)
Alkaline Phosphatase: 73 U/L (ref 38–126)
Anion gap: 7 (ref 5–15)
BUN: 23 mg/dL (ref 8–23)
CO2: 28 mmol/L (ref 22–32)
Calcium: 9.2 mg/dL (ref 8.9–10.3)
Chloride: 105 mmol/L (ref 98–111)
Creatinine: 1.07 mg/dL — ABNORMAL HIGH (ref 0.44–1.00)
GFR, Estimated: 49 mL/min — ABNORMAL LOW (ref >60)
Glucose, Bld: 103 mg/dL — ABNORMAL HIGH (ref 70–99)
Potassium: 3.7 mmol/L (ref 3.5–5.1)
Sodium: 140 mmol/L (ref 135–145)
Total Bilirubin: 0.4 mg/dL (ref 0.3–1.2)
Total Protein: 6.4 g/dL — ABNORMAL LOW (ref 6.5–8.1)

## 2022-02-13 MED ORDER — FULVESTRANT 250 MG/5ML IM SOSY
500.0000 mg | PREFILLED_SYRINGE | INTRAMUSCULAR | Status: DC
  Administered 2022-02-13: 500 mg via INTRAMUSCULAR
  Filled 2022-02-13: qty 10

## 2022-02-13 NOTE — Progress Notes (Signed)
?[place]   ?Telephone:(336) [phone] [phone]   ?Clinic Follow up Note  ? ?Patient Care Team: ?Lajean Manes, MD as PCP - General ?Jovita Kussmaul, MD as Consulting Physician (General Surgery) ?Truitt Merle, MD as Consulting Physician (Hematology) ?[name], MD as Consulting Physician (Radiation Oncology) ?Sylvan Cheese, NP as Nurse Practitioner (Hematology and Oncology) ?Karen Kays, NP as Nurse Practitioner (Nurse Practitioner) ? ?Date of Service:  02/13/2022 ? ?CHIEF COMPLAINT: f/u of left breast cancer ? ?CURRENT THERAPY:  ?-Faslodex injection, monthly, started 06/07/17. Held 09/26/19-04/29/20 ?-Ibrance 19m, 2 weeks on/1 week off starting 01/09/22 ? ?ASSESSMENT & PLAN:  ?Gloria DOMANSKIis a 88y.o. post-hysterectomy female with  ? ?1. Cancer of the central portion of left female breast, Stage IIB, T2 N1 invasive lobular carcinoma, grade 2, ER+/PR+, HER-2-, local chest wall recurrence in 09/2015   ?-initially diagnosed in 07/2010, treated with mastectomy and adjuvant tamoxifen for 4.5 years.  ?-She developed local chest wall recurrence in 09/2015 and completed chest wall radiation. ?-she had another recurrence in 10/2015, s/p resection in 06/2016, surgical margins were positive. Dr. TMarlou Starksdid not offer more surgery because complete surgical resection was unlikely. Pt agreed.  ?-She started monthly fulvestrant injection on 06/07/17. Due to lost follow up and miscommunications, she was off treatment 09/26/19-04/29/20. She tolerated well aside from reaction to the injection site. ?-PET scan on 12/19/21 showed new and increasing abdominal retroperitoneal adenopathy. Given the lobular histology, which commonly metastases to abdomen, I think this is most likely cancer progression.  ?-she started Ibrance 75 mg on 01/09/22. She did not tolerate well, with fatigue and diarrhea, and stopped on 01/21/22. She now also has an infection on her foot. I do not plan to restart this. ?-I  reviewed that she will continue the fulvestrant injections as she has been. Labs reviewed, creatinine improved to 1.07. Adequate to proceed with fulvestrant. ?-I recommend Guardant360 test to see if she has ESR 1 mutation. If yes, I will change her treatment to Elacestrant  ?  ?2. HTN, CAD   ?-She'll continue follow-up with her primary care physician and cardiologist ?  ?3. Osteoporosis ?-Her last DEXA scan in 06/2012 showed osteoporosis ?-I previously encouraged her to continue calcium and Vit D and to repeat DEXA at her PCP office ?  ?4. Goal of care discussion ?-The patient understands the goal of care is palliative. ?-she is full code for now   ?  ?  ?Plan  ?-proceed with fulvestrant injection today and every 4 weeks ?-lab, f/u, and fulvestrant injection in 4 weeks ?-will do Guardant 360 on next lab draw  ? ? ?No problem-specific Assessment & Plan notes found for this encounter. ? ? ?SUMMARY OF ONCOLOGIC HISTORY: ?Oncology History Overview Note  ?Can[place] of central portion of left female breast ([place] ?  Staging form: Breast, AJCC 7th Edition ?    Pathologic stage from 08/04/2010: Stage IIB (T2, N1a, cM0) - Signed by YTruitt Merle MD on 12/09/2015 ? ?  ? ?  ?[place]  ?08/03/2010 Mammogram  ?  left nipple retraction, and the palpable mass subareolar left breast at the 6:00 position. Ultrasound confirmed the presence of a mass measuring 2.6 x 1.7 x 2.4 cm ? ?  ?08/04/2010 Initial Biopsy  ? left breast mass biopsy showed an invasive mammary carcinoma with lobular features. ER 95%, PR 97%, Ki-67 17%, HER-2/neu (-) ? ?  ?08/04/2010 Pathologic Stage  ? Stage IIB:  T2 N1a ? ?  ?08/11/2010 Imaging  ? left breast mass 4.6 x 4.2 x 2.3 cm with enhancement distortion extending to the nipple retraction. There is questionable anterior mediastinal lymph node seen ? ?  ?07/2010 - 09/03/2010 Anti-estrogen oral therapy  ? neoadjuvant letrozole  ? ?  ?09/03/2010 Surgery  ?  left breast mastectomy with  sentinel node biopsy on 09/03/2010. ? ?  ?08/2010 - 01/2015 Anti-estrogen oral therapy  ? Tamoxifen, pt stopped on her own  ? ?  ?12/03/2010 - 01/23/2011 Radiation Therapy  ? left chest wall and axilla radiaiton  ? ?  ?10/19/2015 Progression  ? Left chest wall recurrence, s/p resection on 11/25/2015 with positive margins ([place])  ? ?  ?12/10/2015 - 05/31/2017 Anti-estrogen oral therapy  ? anastrozole 56m daily, which was switched to Tamoxifen on 06/30/2016 due to persistent disease. ?  ?01/03/2016 - 03/03/2016 Radiation Therapy  ? Radiation to left chest wall recurrence (12/06/2015-02/04/2016 - 44 Gy), pt progressed through the radiation, and had additional 10 fractions of radiation (02/21/16-03/03/2016 - total 70 Gy to the left chest wall) ? ?  ?07/14/2016 PET scan  ? PET 07/14/2016 ?IMPRESSION: ?1. No findings of recurrent malignancy. ?2. Radiation fibrosis in the lingula. A small left axillary lymph ?node is not hypermetabolic and only 6 mm in diameter. This is in the ?vicinity of the prior axillary cyst. ?3. Pelvic floor laxity. ? ?  ?07/17/2016 Surgery  ? Chest wall soft tissue resection for local recurrence ? ?  ?07/17/2016 Pathology Results  ? Left chest wall two soft tissue resection both showed invasive lobular carcinoma, margins were positive. ER 95% positive, PR 60% positive, HER-2 negative. ? ?  ?07/27/2016 Imaging  ? CT chest, abdomen and pelvis wo contrast  07/27/2016 ?IMPRESSION: ?Sigmoid diverticulosis without acute diverticulitis. No bowel obstruction or acute inflammation. ?Degenerative disc disease and facet arthropathy of the lower lumbar spine with slight grade 1 anterolisthesis of L4 on L5. ?Stable appearing simple left-sided renal cysts. ? ?  ?03/01/2017 Mammogram  ? Mammogram 03/01/2017 ?IMPRESSION: ?No mammographic evidence of malignancy. A result letter of this ?screening mammogram will be mailed directly to the patient. ? RECOMMENDATION: ?Screening mammogram in one year ? ?  ?04/10/2017 Imaging  ? PET IMPRESSION: ?1.  Stable exam.  No findings of recurrent malignancy. ?2. Changes of external beam radiation again noted within the ?lingula. ?3. Small left axillary lymph node exhibits mild increased uptake. ?Unchanged from previous exam. ? ?  ?06/07/2017 -  Anti-estrogen oral therapy  ? Stopped Tamoxifen and Began monthly Faslodex IM injection on 06/07/17  ?-Held after 09/26/19 due to lost f/u. Restart with loading dose on 04/29/20.  ?  ?09/19/2017 Imaging  ? CT CAP WO Contrast 09/19/17 ?IMPRESSION: ?1. Surgical changes from a left mastectomy without CT findings to ?suggest recurrent chest wall tumor or axillary lymphadenopathy. ?2. No findings to suggest metastatic disease involving the neck, ?chest, abdomen or pelvis or osseous structures. ?3. Stable radiation changes involving the lingula.  ?  ?09/19/2017 Imaging  ? Bone Scan Whole Body  ?IMPRESSION: ?No definite scintigraphic evidence of osseous metastatic disease. ? Scattered degenerative type uptake as above. ? ?  ?03/11/2018 PET scan  ? IMPRESSION:  ?1. No evidence of recurrent disease. ?2.  Aortic atherosclerosis  ? ?  ?09/03/2018 PET scan  ? 09/03/2018 PET Scan ?IMPRESSION: ?1. No findings to suggest locally recurrent disease or metastatic ?disease in the neck, chest, abdomen or pelvis. ?2. Hepatic steatosis. ?3. Colonic diverticulosis without evidence of acute diverticulitis ?  at this time. ?4. Aortic atherosclerosis. ?5. Additional incidental findings, as above. ? ?  ?11/25/2018 Imaging  ? DG Ribs Unilateral W/Chest Left  ?IMPRESSION: ?Stable left lingular scarring. Normal left ribs. No acute ?cardiopulmonary abnormality seen. ? ?  ?04/04/2019 PET scan  ? PET  ?IMPRESSION: ?1. No evidence of hypermetabolic recurrent or metastatic disease. ?2. Hepatic steatosis. ?3.  Aortic Atherosclerosis (ICD10-I70.0). ? ?  ?05/18/2020 PET scan  ? IMPRESSION: ?1. Findings of LEFT mastectomy and axillary dissection with ?radiation changes, no signs of metastatic disease or disease ?recurrence at this  time. ?2. Hepatic steatosis. ?3. Aortic atherosclerosis. ?  ?Aortic Atherosclerosis (ICD10-I70.0). ?  ?  ?11/09/2020 PET scan  ? IMPRESSION: ?1. No findings of hypermetabolic metastatic disease in the neck, ?chest

## 2022-02-13 NOTE — Progress Notes (Signed)
Subjective:  ? ?Patient ID: Gloria [name], female   DOB: 88 y.o.   MRN: [id number]  ? ?HPI ?88 year old female presents the office today for an acute appointment today for concerns of pain to her left big toe.  This started about 4 weeks ago but recently told her daughter about this.  She is having some redness and soreness and she also feels a " buzzing" sensation to the toe.  No drainage or pus at this time.  No recent treatment.  No injuries.  No other concerns. ? ? ?Review of Systems  ?All other systems reviewed and are negative. ? ?Past Medical History:  ?Diagnosis Date  ? Anxiety   ? Blood in urine   ? CAD (coronary artery disease)   ? non obstructive by cath  ? Cancer [facility])   ? breast- left  ? COVID   ? Depression   ? Family history of breast cancer   ? Family history of colon cancer   ? Family history of ovarian cancer   ? Family history of pancreatic cancer   ? Family history of prostate cancer   ? GERD (gastroesophageal reflux disease)   ? Hypertension   ? Knee fracture   ? Personal history of radiation therapy 2017  ? TR (tricuspid regurgitation)   ? mild by Echo 12/2008 EF >55%  ? ? ?Past Surgical History:  ?Procedure Laterality Date  ? ABDOMINAL AORTAGRAM N/A 09/08/2011  ? Procedure: ABDOMINAL Maxcine Ham;  Surgeon: Troy Sine, MD;  Location: [facility] C[facility];  Service: Cardiovascular;  Laterality: N/A;  ? ABDOMINAL HYSTERECTOMY    ? BREAST EXCISIONAL BIOPSY Left 2017  ? X3  ? BREAST SURGERY  08-30-10  ? mastectomy  ? CARDIAC CATHETERIZATION  08/2011  ? 20% LAD stenosis, 20% diagonal stenosis, 10-20% proximal dominant RCA stenosis  ? COLON SURGERY    ? LEFT HEART CATHETERIZATION WITH CORONARY ANGIOGRAM N/A 09/08/2011  ? Procedure: LEFT HEART CATHETERIZATION WITH CORONARY ANGIOGRAM;  Surgeon: Troy Sine, MD;  Location: [facility] CATH LAB;  Service: Cardiovascular;  Laterality: N/A;  ? MASTECTOMY    ? MINOR BREAST BIOPSY Left 11/25/2015  ? Procedure: MINOR EXCISION MASS LEFT CHEST WALL;  Surgeon: Autumn Messing III, MD;   Location: [place];  Service: General;  Laterality: Left;  ? MINOR BREAST BIOPSY Left 07/17/2016  ? Procedure: EXCISION OF 2 LEFT CHEST WALL MASSES;  Surgeon: Autumn Messing III, MD;  Location: [place];  Service: General;  Laterality: Left;  ? TONSILLECTOMY    ? ? ? ?Current Outpatient Medications:  ?  doxycycline (VIBRA-TABS) 100 MG tablet, Take 1 tablet (100 mg total) by mouth 2 (two) times daily., Disp: 14 tablet, Rfl: 0 ?  mupirocin ointment (BACTROBAN) 2 %, Apply 1 application. topically 2 (two) times daily., Disp: 30 g, Rfl: 2 ?  acetaminophen (TYLENOL) 500 MG tablet, Take 1,000 mg by mouth daily as needed for moderate pain. Reported on 03/10/2016, Disp: , Rfl:  ?  ALPRAZolam (XANAX) 0.25 MG tablet, Take by mouth., Disp: , Rfl:  ?  benzonatate (TESSALON) 100 MG capsule, Take 1 capsule (100 mg total) by mouth every 8 (eight) hours., Disp: 21 capsule, Rfl: 0 ?  CALCIUM-MAGNESIUM-VITAMIN D PO, Take 1 tablet by mouth 2 (two) times daily., Disp: , Rfl:  ?  doxepin (SINEQUAN) 10 MG capsule, Take 10 mg by mouth at bedtime., Disp: , Rfl:  ?  famotidine (PEPCID) 20 MG tablet, Take 20 mg by mouth daily., Disp: ,  Rfl:  ?  FLOVENT HFA 44 MCG/ACT inhaler, Inhale into the lungs., Disp: , Rfl:  ?  fluticasone (FLONASE) 50 MCG/ACT nasal spray, Place 1 spray into both nostrils daily as needed for allergies. , Disp: , Rfl:  ?  guaiFENesin (MUCINEX) 600 MG 12 hr tablet, Take 600 mg by mouth daily as needed for cough or to loosen phlegm. Reported on 03/28/2016, Disp: , Rfl:  ?  hydrochlorothiazide (MICROZIDE) 12.5 MG capsule, Take 1 capsule (12.5 mg total) by mouth daily. Appointment needed continuation of refills-1st attempt, Disp: 30 capsule, Rfl: 0 ?  losartan (COZAAR) 25 MG tablet, 25 mg at bedtime. , Disp: , Rfl:  ?  losartan (COZAAR) 50 MG tablet, Take 1 tablet by mouth once daily, Disp: 90 tablet, Rfl: 1 ?  nitroGLYCERIN (NITROSTAT) 0.4 MG SL tablet, Place 1 tablet (0.4 mg total) under the tongue  every 5 (five) minutes as needed for chest pain. <PLEASE MAKE APPOINTMENT>, Disp: 25 tablet, Rfl: 3 ?  ondansetron (ZOFRAN) 4 MG tablet, Take 1 tablet (4 mg total) by mouth every 8 (eight) hours as needed for nausea or vomiting. OK TO TAKE 2 TABS as needed if 1 tab does not work, Disp: 20 tablet, Rfl: 0 ?  palbociclib (IBRANCE) 75 MG tablet, Take 1 tablet (75 mg total) by mouth daily. Take for 14 days on, 7 days off, repeat every 21 days., Disp: 14 tablet, Rfl: 1 ?  pseudoephedrine-acetaminophen (TYLENOL SINUS) 30-500 MG TABS tablet, Take 1 tablet by mouth every 4 (four) hours as needed., Disp: , Rfl:  ?No current facility-administered medications for this visit. ? ?Facility-Administered Medications Ordered in Other Visits:  ?  fulvestrant (FASLODEX) injection 500 mg, 500 mg, Intramuscular, Q30 days, Truitt Merle, MD, 500 mg at 02/13/22 1454 ? ?Allergies  ?Allergen Reactions  ? Contrast Media [Iodinated Contrast Media] Anaphylaxis  ?  09/19/17-Pt premedicated. Then pt stated she was DNR. Spoke with [name] who did not realize allergy was recorded as anaphylactic. [name] verbally gave ok to change CT exam to without IV contrast. Pt and daughter do not recall occurrence of anaphylactic reaction to contrast media. Geanie Kenning, 3:27pm  ? Penicillins Anaphylaxis and Swelling  ?  Has patient had a PCN reaction causing immediate rash, facial/tongue/throat swelling, SOB or lightheadedness with hypotension: Yes ?Has patient had a PCN reaction causing severe rash involving mucus membranes or skin necrosis: No ?Has patient had a PCN reaction that required hospitalization Yes ?Has patient had a PCN reaction occurring within the last 10 years: No ?If all of the above answers are "NO", then may proceed with Cephalosporin use. ?  ? Adhesive [Tape] Other (See Comments)  ?  blisters  ? Cephalexin Swelling  ? Ciprofloxacin Swelling  ? Demerol Nausea And Vomiting  ? Meperidine Hcl   ? Sulfa Antibiotics   ?  Sulfamethoxazole-Trimethoprim Itching and Swelling  ?  REACTION: swelling/hives  ? Quinolones Itching and Rash  ?  REACTION: itching, rash ?Pt. Reports no problems with levaquin  ? ? ? ? ?   ?Objective:  ?Physical Exam  ?General: AAO x3, NAD ? ?Dermatological: Incurvation present to the left hallux toenail medial nail border with localized edema and erythema along the nail border but there is no purulence, ascending cellulitis.  No signs of abscess today. ? ?Vascular: Dorsalis Pedis artery and Posterior Tibial artery pedal pulses are 1/4 bilateral with immedate capillary fill time. There is no pain with calf compression, swelling, warmth, erythema.  ? ?Neruologic: Grossly intact via  light touch bilateral. ? ?Musculoskeletal: Tenderness of ingrown toenail but no other areas of discomfort.  Muscular strength 5/5 in all groups tested bilateral. ? ?   ?Assessment:  ? ?Ingrown toenail, PAD left foot ? ?   ?Plan:  ?-Treatment options discussed including all alternatives, risks, and complications ?-Etiology of symptoms were discussed ?-X-rays were obtained and reviewed.  3 views of the left foot were obtained.  No evidence of acute fracture, osteomyelitis.  ?-Given decreased pulses today I did an ABI in the office which was read as "PAD" on the left.  Due to this I ordered formal arterial studies.  Also given concern about circulation we will try to treat this conservatively at this time.  I sharply debrided the nails today without any complications or bleeding.  Start doxycycline.  Also prescribe mupirocin ointment to apply to the nail.  Monitoring signs or symptoms worsen infection or discomfort let me know should any occur.  If symptoms continue we will then proceed with partial nail avulsion ? ?Trula Slade DPM ? ?   ? ?

## 2022-02-14 ENCOUNTER — Telehealth: Payer: Self-pay | Admitting: Hematology

## 2022-02-14 NOTE — Telephone Encounter (Signed)
Left message with follow-up appointment per 4/17 los. ?

## 2022-02-15 ENCOUNTER — Ambulatory Visit (HOSPITAL_COMMUNITY)
Admission: RE | Admit: 2022-02-15 | Discharge: 2022-02-15 | Disposition: A | Discharge status: Home / Self Care | Payer: PPO | Source: Ambulatory Visit | Attending: Podiatry | Admitting: Podiatry

## 2022-02-15 DIAGNOSIS — I739 Peripheral vascular disease, unspecified: Secondary | ICD-10-CM | POA: Insufficient documentation

## 2022-02-16 ENCOUNTER — Other Ambulatory Visit: Payer: Self-pay

## 2022-02-16 ENCOUNTER — Other Ambulatory Visit: Payer: Self-pay | Admitting: Pharmacist

## 2022-02-16 ENCOUNTER — Other Ambulatory Visit (HOSPITAL_COMMUNITY): Payer: Self-pay

## 2022-02-16 DIAGNOSIS — Z170 Estrogen receptor positive status [ER+]: Secondary | ICD-10-CM

## 2022-02-16 MED ORDER — PALBOCICLIB 75 MG PO TABS
75.0000 mg | ORAL_TABLET | Freq: Every day | ORAL | 1 refills | Status: DC
Start: 2022-02-16 — End: 2022-02-16
  Filled 2022-02-16: qty 14, 14d supply, fill #0

## 2022-02-16 MED ORDER — PALBOCICLIB 75 MG PO TABS: 75.0000 mg | ORAL_TABLET | Freq: Every day | ORAL | 1 refills | Status: DC

## 2022-02-16 NOTE — Progress Notes (Signed)
Oral Oncology Pharmacist Encounter ? ?Prescription refill for Ibrance (palbociclib) sent to [facility] in error. Patient enrolled in manufacturer assistance and receives medication through [employer] Patient Assistance. Prescription redirected to [facility] for dispensing. ? ?Leron Croak, PharmD, BCPS ?Hematology/Oncology Clinical Pharmacist ?[facility] ?[phone] ?02/16/2022 8:51 AM ? ? ? ?

## 2022-02-20 ENCOUNTER — Telehealth: Payer: Self-pay | Admitting: Podiatry

## 2022-02-20 NOTE — Telephone Encounter (Signed)
This is Tilda Burrow calling for my mother. She saw Dr. Jacqualyn Posey about a week and a half ago. She did not finish the antibiotic, but she did go for the test at [facility] and you have the results. We would like to know them. In addition, she said her other toe hurts now like an ingrown toenail. I don't know if its more neuropathy or what. We are keeping the other toenail in check. We have a big wedding this weekend that we want her to be able to walk in. ?

## 2022-02-20 NOTE — Telephone Encounter (Signed)
Left v/m for pt's daughter, Gloria [name] to  let her know the results of the test and directions for the other toe as well. Informed her to call us back with any questions and/or concerns. ?

## 2022-02-21 ENCOUNTER — Other Ambulatory Visit: Payer: Self-pay

## 2022-03-03 ENCOUNTER — Ambulatory Visit: Payer: PPO | Admitting: Podiatry

## 2022-03-03 DIAGNOSIS — M79674 Pain in right toe(s): Secondary | ICD-10-CM | POA: Diagnosis not present

## 2022-03-03 DIAGNOSIS — L600 Ingrowing nail: Secondary | ICD-10-CM

## 2022-03-03 DIAGNOSIS — B351 Tinea unguium: Secondary | ICD-10-CM | POA: Diagnosis not present

## 2022-03-03 DIAGNOSIS — M79675 Pain in left toe(s): Secondary | ICD-10-CM

## 2022-03-07 NOTE — Progress Notes (Signed)
Subjective: ?88 year old female presents the office today for evaluation regarding her left big toe.  For last appointment she has been feeling better after treatment and she is been soaking however the right big toenail started to the same thing.  No swelling redness or any drainage.  General nails are thickened elongated they cause discomfort she is not able to trim them herself.  No open lesions. ? ?Objective: ?NAD ?DP/PT pulses palpable bilaterally, CRT less than 3 seconds ?Nails appear to be hypertrophic, dystrophic with yellow, brown discoloration and elongated x10.  Incurvation of the right hallux toenail both the medial and lateral nail borders.  Minimal ingrowing of left hallux but no significant left side today.  There is no edema, erythema or signs of infection. ?No pain with calf compression, swelling, warmth, erythema ? ?Assessment: ?Symptomatic onychomycosis, ingrown toenail ? ?Plan: ?-All treatment options discussed with the patient including all alternatives, risks, complications.  ?-Left has been doing better.  She would proceed with debridement the right side.  I sharply debrided all the nails x10 without any complications or bleeding.  Particular Sharp debrided the right hallux toenail remove the central portion ingrown nail with any complications or bleeding.  Watch for any reoccurrence or signs of infection.  ?-Reviewed arterial studies. ?-Patient encouraged to call the office with any questions, concerns, change in symptoms.  ? ?Trula Slade DPM ? ? ?

## 2022-03-09 ENCOUNTER — Other Ambulatory Visit (HOSPITAL_COMMUNITY): Payer: Self-pay

## 2022-03-16 ENCOUNTER — Encounter: Payer: Self-pay | Admitting: Hematology

## 2022-03-16 ENCOUNTER — Inpatient Hospital Stay: Payer: PPO

## 2022-03-16 ENCOUNTER — Other Ambulatory Visit: Payer: Self-pay

## 2022-03-16 ENCOUNTER — Inpatient Hospital Stay: Payer: PPO | Attending: Hematology

## 2022-03-16 ENCOUNTER — Inpatient Hospital Stay (HOSPITAL_BASED_OUTPATIENT_CLINIC_OR_DEPARTMENT_OTHER): Payer: PPO | Admitting: Hematology

## 2022-03-16 VITALS — BP 113/76 | HR 66 | Temp 98.0°F | Resp 16 | Ht 61.0 in | Wt 131.9 lb

## 2022-03-16 DIAGNOSIS — K219 Gastro-esophageal reflux disease without esophagitis: Secondary | ICD-10-CM | POA: Insufficient documentation

## 2022-03-16 DIAGNOSIS — Z923 Personal history of irradiation: Secondary | ICD-10-CM | POA: Diagnosis not present

## 2022-03-16 DIAGNOSIS — M810 Age-related osteoporosis without current pathological fracture: Secondary | ICD-10-CM | POA: Diagnosis not present

## 2022-03-16 DIAGNOSIS — Z79818 Long term (current) use of other agents affecting estrogen receptors and estrogen levels: Secondary | ICD-10-CM | POA: Diagnosis not present

## 2022-03-16 DIAGNOSIS — Z8616 Personal history of COVID-19: Secondary | ICD-10-CM | POA: Diagnosis not present

## 2022-03-16 DIAGNOSIS — Z79899 Other long term (current) drug therapy: Secondary | ICD-10-CM | POA: Insufficient documentation

## 2022-03-16 DIAGNOSIS — Z803 Family history of malignant neoplasm of breast: Secondary | ICD-10-CM | POA: Insufficient documentation

## 2022-03-16 DIAGNOSIS — Z170 Estrogen receptor positive status [ER+]: Secondary | ICD-10-CM

## 2022-03-16 DIAGNOSIS — Z9012 Acquired absence of left breast and nipple: Secondary | ICD-10-CM | POA: Diagnosis not present

## 2022-03-16 DIAGNOSIS — Z7951 Long term (current) use of inhaled steroids: Secondary | ICD-10-CM | POA: Insufficient documentation

## 2022-03-16 DIAGNOSIS — Z800 Family history of malignant neoplasm of digestive organs: Secondary | ICD-10-CM | POA: Diagnosis not present

## 2022-03-16 DIAGNOSIS — C50112 Malignant neoplasm of central portion of left female breast: Secondary | ICD-10-CM | POA: Insufficient documentation

## 2022-03-16 DIAGNOSIS — K760 Fatty (change of) liver, not elsewhere classified: Secondary | ICD-10-CM | POA: Insufficient documentation

## 2022-03-16 DIAGNOSIS — I10 Essential (primary) hypertension: Secondary | ICD-10-CM | POA: Diagnosis not present

## 2022-03-16 DIAGNOSIS — I700 Atherosclerosis of aorta: Secondary | ICD-10-CM | POA: Insufficient documentation

## 2022-03-16 DIAGNOSIS — I2510 Atherosclerotic heart disease of native coronary artery without angina pectoris: Secondary | ICD-10-CM | POA: Diagnosis not present

## 2022-03-16 LAB — CBC WITH DIFFERENTIAL (CANCER CENTER ONLY)
Abs Immature Granulocytes: 0.01 10*3/uL (ref 0.00–0.07)
Basophils Absolute: 0 10*3/uL (ref 0.0–0.1)
Basophils Relative: 1 %
Eosinophils Absolute: 0.6 10*3/uL — ABNORMAL HIGH (ref 0.0–0.5)
Eosinophils Relative: 11 %
HCT: 33.7 % — ABNORMAL LOW (ref 36.0–46.0)
Hemoglobin: 11.4 g/dL — ABNORMAL LOW (ref 12.0–15.0)
Immature Granulocytes: 0 %
Lymphocytes Relative: 13 %
Lymphs Abs: 0.7 10*3/uL (ref 0.7–4.0)
MCH: 32.4 pg (ref 26.0–34.0)
MCHC: 33.8 g/dL (ref 30.0–36.0)
MCV: 95.7 fL (ref 80.0–100.0)
Monocytes Absolute: 0.4 10*3/uL (ref 0.1–1.0)
Monocytes Relative: 8 %
Neutro Abs: 3.4 10*3/uL (ref 1.7–7.7)
Neutrophils Relative %: 67 %
Platelet Count: 151 10*3/uL (ref 150–400)
RBC: 3.52 MIL/uL — ABNORMAL LOW (ref 3.87–5.11)
RDW: 14.4 % (ref 11.5–15.5)
WBC Count: 5.1 10*3/uL (ref 4.0–10.5)
nRBC: 0 % (ref 0.0–0.2)

## 2022-03-16 LAB — CMP (CANCER CENTER ONLY)
ALT: 12 U/L (ref 0–44)
AST: 21 U/L (ref 15–41)
Albumin: 3.8 g/dL (ref 3.5–5.0)
Alkaline Phosphatase: 74 U/L (ref 38–126)
Anion gap: 5 (ref 5–15)
BUN: 28 mg/dL — ABNORMAL HIGH (ref 8–23)
CO2: 28 mmol/L (ref 22–32)
Calcium: 9 mg/dL (ref 8.9–10.3)
Chloride: 104 mmol/L (ref 98–111)
Creatinine: 1.21 mg/dL — ABNORMAL HIGH (ref 0.44–1.00)
GFR, Estimated: 42 mL/min — ABNORMAL LOW (ref >60)
Glucose, Bld: 103 mg/dL — ABNORMAL HIGH (ref 70–99)
Potassium: 4 mmol/L (ref 3.5–5.1)
Sodium: 137 mmol/L (ref 135–145)
Total Bilirubin: 0.5 mg/dL (ref 0.3–1.2)
Total Protein: 6.4 g/dL — ABNORMAL LOW (ref 6.5–8.1)

## 2022-03-16 MED ORDER — ONDANSETRON HCL 4 MG PO TABS: 4.0000 mg | ORAL_TABLET | Freq: Three times a day (TID) | ORAL | 1 refills | Status: DC | PRN

## 2022-03-16 MED ORDER — FULVESTRANT 250 MG/5ML IM SOSY
500.0000 mg | PREFILLED_SYRINGE | INTRAMUSCULAR | Status: DC
  Administered 2022-03-16: 500 mg via INTRAMUSCULAR

## 2022-03-16 NOTE — Progress Notes (Signed)
[facility]   [phone] [phone]   Clinic Follow up Note   Patient Care Team: Lajean Manes, MD as PCP - [place]k, Deale, MD as Consulting Physician (General Surgery) Truitt Merle, MD as Consulting Physician (Hematology) [name], MD as Consulting Physician (Radiation Oncology) Sylvan Cheese, NP as Nurse Practitioner (Hematology and Oncology) Karen Kays, NP as Nurse Practitioner (Nurse Practitioner)  Date of Service:  03/16/2022  CHIEF COMPLAINT: f/u of left breast cancer  CURRENT THERAPY:  -Faslodex injection, monthly, started 06/07/17. Held 09/26/19-04/29/20 -Ibrance 36m, 2 weeks on/1 week off starting 01/09/22  ASSESSMENT & PLAN:  SROZELIA CATAPANOis a 88y.o. post-hysterectomy female with   1. Cancer of the central portion of left female breast, Stage IIB, T2 N1 invasive lobular carcinoma, grade 2, ER+/PR+, HER-2-, local chest wall recurrence in 09/2015   -initially diagnosed in 07/2010, treated with mastectomy and adjuvant tamoxifen for 4.5 years.  -She developed local chest wall recurrence in 09/2015 and completed chest wall radiation. -she had another recurrence in 10/2015, s/p resection in 06/2016, surgical margins were positive. Dr. TMarlou Starksdid not offer more surgery because complete surgical resection was unlikely. Pt agreed.  -She started monthly fulvestrant injection on 06/07/17. Due to lost follow up and miscommunications, she was off treatment 09/26/19-04/29/20. She tolerated well aside from reaction to the injection site. -PET scan on 12/19/21 showed new and increasing abdominal retroperitoneal adenopathy. Given the lobular histology, which commonly metastases to abdomen, I think this is most likely cancer progression.  -she started Ibrance 75 mg on 01/09/22. She did not tolerate well, with fatigue and diarrhea, and stopped on 01/21/22. She now also has an infection on her foot. I do not plan to restart this. -I  reviewed that she will continue the fulvestrant injections as she has been. Guardant 360 drawn today.  If she has ESR1 mutation, I would recommend switching to elacestrant. She is agreeable.  labs reviewed, creatinine improved to 1.07. Adequate to proceed with fulvestrant. -f/u in a month    2. HTN, CAD   -She'll continue follow-up with her primary care physician and cardiologist   3. Osteoporosis -Her last DEXA scan in 06/2012 showed osteoporosis -I previously encouraged her to continue calcium and Vit D and to repeat DEXA at her PCP office   4. Goal of care discussion -The patient understands the goal of care is palliative. -she is full code for now       Plan  -proceed with fulvestrant injection today and every 4 weeks -I refilled zofran -await Guardant360 results, If she has ESR1 mutation, I would recommend switching to elacestrant -lab, f/u, and fulvestrant injection in 4 weeks   No problem-specific Assessment & Plan notes found for this encounter.   SUMMARY OF ONCOLOGIC HISTORY: Oncology History Overview Note  Cancer of central portion of left female breast ([facility]   Staging form: Breast, AJCC 7th Edition     Pathologic stage from 08/04/2010: Stage IIB (T2, N1a, cM0) - Signed by YTruitt Merle MD on 12/09/2015       [place]  08/03/2010 Mammogram    left nipple retraction, and the palpable mass subareolar left breast at the 6:00 position. Ultrasound confirmed the presence of a mass measuring 2.6 x 1.7 x 2.4 cm    08/04/2010 Initial Biopsy   left breast mass biopsy showed an invasive mammary carcinoma with lobular features. ER 95%, PR 97%, Ki-67 17%, HER-2/neu (-)  08/04/2010 Pathologic Stage   Stage IIB: T2 N1a    08/11/2010 Imaging   left breast mass 4.6 x 4.2 x 2.3 cm with enhancement distortion extending to the nipple retraction. There is questionable anterior mediastinal lymph node seen    07/2010 - 09/03/2010 Anti-estrogen  oral therapy   neoadjuvant letrozole     09/03/2010 Surgery    left breast mastectomy with sentinel node biopsy on 09/03/2010.    08/2010 - 01/2015 Anti-estrogen oral therapy   Tamoxifen, pt stopped on her own     12/03/2010 - 01/23/2011 Radiation Therapy   left chest wall and axilla radiaiton     10/19/2015 Progression   Left chest wall recurrence, s/p resection on 11/25/2015 with positive margins ([facility])     12/10/2015 - 05/31/2017 Anti-estrogen oral therapy   anastrozole 72m daily, which was switched to Tamoxifen on 06/30/2016 due to persistent disease.   01/03/2016 - 03/03/2016 Radiation Therapy   Radiation to left chest wall recurrence (12/06/2015-02/04/2016 - 44 Gy), pt progressed through the radiation, and had additional 10 fractions of radiation (02/21/16-03/03/2016 - total 70 Gy to the left chest wall)    07/14/2016 PET scan   PET 07/14/2016 IMPRESSION: 1. No findings of recurrent malignancy. 2. Radiation fibrosis in the lingula. A small left axillary lymph node is not hypermetabolic and only 6 mm in diameter. This is in the vicinity of the prior axillary cyst. 3. Pelvic floor laxity.    07/17/2016 Surgery   Chest wall soft tissue resection for local recurrence    07/17/2016 Pathology Results   Left chest wall two soft tissue resection both showed invasive lobular carcinoma, margins were positive. ER 95% positive, PR 60% positive, HER-2 negative.    07/27/2016 Imaging   CT chest, abdomen and pelvis wo contrast  07/27/2016 IMPRESSION: Sigmoid diverticulosis without acute diverticulitis. No bowel obstruction or acute inflammation. Degenerative disc disease and facet arthropathy of the lower lumbar spine with slight grade 1 anterolisthesis of L4 on L5. Stable appearing simple left-sided renal cysts.    03/01/2017 Mammogram   Mammogram 03/01/2017 IMPRESSION: No mammographic evidence of malignancy. A result letter of this screening mammogram will be mailed directly to the patient.   RECOMMENDATION: Screening mammogram in one year    04/10/2017 Imaging   PET IMPRESSION: 1. Stable exam.  No findings of recurrent malignancy. 2. Changes of external beam radiation again noted within the lingula. 3. Small left axillary lymph node exhibits mild increased uptake. Unchanged from previous exam.    06/07/2017 -  Anti-estrogen oral therapy   Stopped Tamoxifen and Began monthly Faslodex IM injection on 06/07/17  -Held after 09/26/19 due to lost f/u. Restart with loading dose on 04/29/20.    09/19/2017 Imaging   CT CAP WO Contrast 09/19/17 IMPRESSION: 1. Surgical changes from a left mastectomy without CT findings to suggest recurrent chest wall tumor or axillary lymphadenopathy. 2. No findings to suggest metastatic disease involving the neck, chest, abdomen or pelvis or osseous structures. 3. Stable radiation changes involving the lingula.    09/19/2017 Imaging   Bone Scan Whole Body  IMPRESSION: No definite scintigraphic evidence of osseous metastatic disease.  Scattered degenerative type uptake as above.    03/11/2018 PET scan   IMPRESSION:  1. No evidence of recurrent disease. 2.  Aortic atherosclerosis     09/03/2018 PET scan   09/03/2018 PET Scan IMPRESSION: 1. No findings to suggest locally recurrent disease or metastatic disease in the neck, chest, abdomen or pelvis. 2. Hepatic steatosis. 3.  Colonic diverticulosis without evidence of acute diverticulitis at this time. 4. Aortic atherosclerosis. 5. Additional incidental findings, as above.    11/25/2018 Imaging   DG Ribs Unilateral W/Chest Left  IMPRESSION: Stable left lingular scarring. Normal left ribs. No acute cardiopulmonary abnormality seen.    04/04/2019 PET scan   PET  IMPRESSION: 1. No evidence of hypermetabolic recurrent or metastatic disease. 2. Hepatic steatosis. 3.  Aortic Atherosclerosis (ICD10-I70.0).    05/18/2020 PET scan   IMPRESSION: 1. Findings of LEFT mastectomy and axillary  dissection with radiation changes, no signs of metastatic disease or disease recurrence at this time. 2. Hepatic steatosis. 3. Aortic atherosclerosis.   Aortic Atherosclerosis (ICD10-I70.0).     11/09/2020 PET scan   IMPRESSION: 1. No findings of hypermetabolic metastatic disease in the neck, chest, abdomen or pelvis. 2. Hepatic steatosis. 3. Aortic atherosclerosis. 4. Increased metabolism about the sphincter complex and lower rectum more likely physiologic; however, consider digital rectal exam and correlation with any symptoms in this location.   05/03/2021 PET scan   IMPRESSION: Status post left mastectomy with left axillary lymph node dissection. Radiation changes in the left upper lobe/lingula.   No evidence of recurrent or metastatic disease.   12/19/2021 PET scan   IMPRESSION: 1. New or increasing abdominal retroperitoneal adenopathy and soft tissue stranding/thickening along the left periaortic station, with mild hypermetabolism. Findings are worrisome for malignancy, possibly unrelated to breast cancer. 2. Left pelvicaliceal dilatation/obstruction with increasing left perinephric stranding. 3. Consider CT or MR abdomen pelvis without and with contrast in further evaluation, as clinically indicated. 4. Aortic atherosclerosis (ICD10-I70.0). Coronary artery calcification.      INTERVAL HISTORY:  Gloria [name] is here for a follow up of breast cancer. She was last seen by me on 02/13/22. She presents to the clinic accompanied by her daughter. She reports she is doing well overall. She does note small bumps to her legs that are very itchy.    All other systems were reviewed with the patient and are negative.  MEDICAL HISTORY:  Past Medical History:  Diagnosis Date   Anxiety    Blood in urine    CAD (coronary artery disease)    non obstructive by cath   Cancer [facility])    breast- left   COVID    Depression    Family history of breast cancer    Family history of  colon cancer    Family history of ovarian cancer    Family history of pancreatic cancer    Family history of prostate cancer    GERD (gastroesophageal reflux disease)    Hypertension    Knee fracture    Personal history of radiation therapy 2017   TR (tricuspid regurgitation)    mild by Echo 12/2008 EF >55%    SURGICAL HISTORY: Past Surgical History:  Procedure Laterality Date   ABDOMINAL AORTAGRAM N/A 09/08/2011   Procedure: ABDOMINAL Maxcine Ham;  Surgeon: Troy Sine, MD;  Location: [facility] CATH LAB;  Service: Cardiovascular;  Laterality: N/A;   ABDOMINAL HYSTERECTOMY     BREAST EXCISIONAL BIOPSY Left 2017   X3   BREAST SURGERY  08-30-10   mastectomy   CARDIAC CATHETERIZATION  08/2011   20% LAD stenosis, 20% diagonal stenosis, 10-20% proximal dominant RCA stenosis   COLON SURGERY     LEFT HEART CATHETERIZATION WITH CORONARY ANGIOGRAM N/A 09/08/2011   Procedure: LEFT HEART CATHETERIZATION WITH CORONARY ANGIOGRAM;  Surgeon: Troy Sine, MD;  Location: [facility] CATH LAB;  Service: Cardiovascular;  Laterality:  N/A;   MASTECTOMY     MINOR BREAST BIOPSY Left 11/25/2015   Procedure: MINOR EXCISION MASS LEFT CHEST WALL;  Surgeon: Autumn Messing III, MD;  Location: [place];  Service: General;  Laterality: Left;   MINOR BREAST BIOPSY Left 07/17/2016   Procedure: EXCISION OF 2 LEFT CHEST WALL MASSES;  Surgeon: Autumn Messing III, MD;  Location: [place];  Service: General;  Laterality: Left;   TONSILLECTOMY      I have reviewed the social history and family history with the patient and they are unchanged from previous note.  ALLERGIES:  is allergic to contrast media [iodinated contrast media], penicillins, adhesive [tape], cephalexin, ciprofloxacin, demerol, meperidine hcl, sulfa antibiotics, sulfamethoxazole-trimethoprim, and quinolones.  MEDICATIONS:  Current Outpatient Medications  Medication Sig Dispense Refill   acetaminophen (TYLENOL) 500 MG tablet Take 1,000 mg by  mouth daily as needed for moderate pain. Reported on 03/10/2016     ALPRAZolam (XANAX) 0.25 MG tablet Take by mouth.     benzonatate (TESSALON) 100 MG capsule Take 1 capsule (100 mg total) by mouth every 8 (eight) hours. 21 capsule 0   CALCIUM-MAGNESIUM-VITAMIN D PO Take 1 tablet by mouth 2 (two) times daily.     doxepin (SINEQUAN) 10 MG capsule Take 10 mg by mouth at bedtime.     doxycycline (VIBRA-TABS) 100 MG tablet Take 1 tablet (100 mg total) by mouth 2 (two) times daily. 14 tablet 0   famotidine (PEPCID) 20 MG tablet Take 20 mg by mouth daily.     FLOVENT HFA 44 MCG/ACT inhaler Inhale into the lungs.     fluticasone (FLONASE) 50 MCG/ACT nasal spray Place 1 spray into both nostrils daily as needed for allergies.      guaiFENesin (MUCINEX) 600 MG 12 hr tablet Take 600 mg by mouth daily as needed for cough or to loosen phlegm. Reported on 03/28/2016     hydrochlorothiazide (MICROZIDE) 12.5 MG capsule Take 1 capsule (12.5 mg total) by mouth daily. Appointment needed continuation of refills-1st attempt 30 capsule 0   losartan (COZAAR) 25 MG tablet 25 mg at bedtime.      losartan (COZAAR) 50 MG tablet Take 1 tablet by mouth once daily 90 tablet 1   mupirocin ointment (BACTROBAN) 2 % Apply 1 application. topically 2 (two) times daily. 30 g 2   nitroGLYCERIN (NITROSTAT) 0.4 MG SL tablet Place 1 tablet (0.4 mg total) under the tongue every 5 (five) minutes as needed for chest pain. <PLEASE MAKE APPOINTMENT> 25 tablet 3   ondansetron (ZOFRAN) 4 MG tablet Take 1 tablet (4 mg total) by mouth every 8 (eight) hours as needed for nausea or vomiting. OK TO TAKE 2 TABS as needed if 1 tab does not work 30 tablet 1   pseudoephedrine-acetaminophen (TYLENOL SINUS) 30-500 MG TABS tablet Take 1 tablet by mouth every 4 (four) hours as needed.     No current facility-administered medications for this visit.    PHYSICAL EXAMINATION: ECOG PERFORMANCE STATUS: 2 - Symptomatic, <50% confined to bed  Vitals:    03/16/22 1432  BP: 113/76  Pulse: 66  Resp: 16  Temp: 98 F (36.7 C)  SpO2: 100%   Wt Readings from Last 3 Encounters:  03/16/22 131 lb 14.4 oz (59.8 kg)  02/13/22 131 lb 12.8 oz (59.8 kg)  01/20/22 130 lb 9.6 oz (59.2 kg)    GENERAL:alert, no distress and comfortable SKIN: skin color normal, no rashes or significant lesions EYES: normal, Conjunctiva are pink and  non-injected, sclera clear  NEURO: alert & oriented x 3 with fluent speech  LABORATORY DATA:  I have reviewed the data as listed    Latest Ref Rng & Units 03/16/2022    2:02 PM 02/13/2022    2:10 PM 02/03/2022    1:47 PM  CBC  WBC 4.0 - 10.5 K/uL 5.1   4.2   2.7    Hemoglobin 12.0 - 15.0 g/dL 11.4   11.1   11.4    Hematocrit 36.0 - 46.0 % 33.7   33.7   34.7    Platelets 150 - 400 K/uL 151   230   185          Latest Ref Rng & Units 03/16/2022    2:02 PM 02/13/2022    2:10 PM 02/03/2022    1:47 PM  CMP  Glucose 70 - 99 mg/dL 103   103   94    BUN 8 - 23 mg/dL 28   23   20     Creatinine 0.44 - 1.00 mg/dL 1.21   1.07   1.14    Sodium 135 - 145 mmol/L 137   140   140    Potassium 3.5 - 5.1 mmol/L 4.0   3.7   3.5    Chloride 98 - 111 mmol/L 104   105   104    CO2 22 - 32 mmol/L 28   28   27     Calcium 8.9 - 10.3 mg/dL 9.0   9.2   9.1    Total Protein 6.5 - 8.1 g/dL 6.4   6.4   6.7    Total Bilirubin 0.3 - 1.2 mg/dL 0.5   0.4   0.5    Alkaline Phos 38 - 126 U/L 74   73   65    AST 15 - 41 U/L 21   20   21     ALT 0 - 44 U/L 12   12   13         RADIOGRAPHIC STUDIES: I have personally reviewed the radiological images as listed and agreed with the findings in the report. No results found.    No orders of the defined types were placed in this encounter.  All questions were answered. The patient knows to call the clinic with any problems, questions or concerns. No barriers to learning was detected. The total time spent in the appointment was 30 minutes.     Truitt Merle, MD 03/16/2022   I, Wilburn Mylar, am  acting as scribe for Truitt Merle, MD.   I have reviewed the above documentation for accuracy and completeness, and I agree with the above.

## 2022-03-22 DIAGNOSIS — C50112 Malignant neoplasm of central portion of left female breast: Secondary | ICD-10-CM | POA: Diagnosis not present

## 2022-03-23 DIAGNOSIS — R059 Cough, unspecified: Secondary | ICD-10-CM | POA: Diagnosis not present

## 2022-03-23 DIAGNOSIS — M26609 Unspecified temporomandibular joint disorder, unspecified side: Secondary | ICD-10-CM | POA: Diagnosis not present

## 2022-03-23 DIAGNOSIS — J40 Bronchitis, not specified as acute or chronic: Secondary | ICD-10-CM | POA: Diagnosis not present

## 2022-03-28 ENCOUNTER — Telehealth: Payer: Self-pay | Admitting: Hematology

## 2022-03-28 LAB — GUARDANT 360

## 2022-03-28 NOTE — Telephone Encounter (Signed)
Left message with follow-up appointment per 5/18 los.

## 2022-04-13 ENCOUNTER — Inpatient Hospital Stay: Payer: PPO | Attending: Hematology

## 2022-04-13 ENCOUNTER — Inpatient Hospital Stay: Payer: PPO

## 2022-04-13 ENCOUNTER — Inpatient Hospital Stay (HOSPITAL_BASED_OUTPATIENT_CLINIC_OR_DEPARTMENT_OTHER): Payer: PPO | Admitting: Oncology

## 2022-04-13 ENCOUNTER — Other Ambulatory Visit: Payer: Self-pay

## 2022-04-13 VITALS — BP 132/78 | HR 80 | Temp 98.3°F | Resp 17 | Ht 61.0 in | Wt 130.5 lb

## 2022-04-13 DIAGNOSIS — Z923 Personal history of irradiation: Secondary | ICD-10-CM | POA: Insufficient documentation

## 2022-04-13 DIAGNOSIS — Z170 Estrogen receptor positive status [ER+]: Secondary | ICD-10-CM | POA: Insufficient documentation

## 2022-04-13 DIAGNOSIS — C7989 Secondary malignant neoplasm of other specified sites: Secondary | ICD-10-CM | POA: Insufficient documentation

## 2022-04-13 DIAGNOSIS — I2510 Atherosclerotic heart disease of native coronary artery without angina pectoris: Secondary | ICD-10-CM | POA: Diagnosis not present

## 2022-04-13 DIAGNOSIS — C50112 Malignant neoplasm of central portion of left female breast: Secondary | ICD-10-CM | POA: Diagnosis not present

## 2022-04-13 DIAGNOSIS — Z7951 Long term (current) use of inhaled steroids: Secondary | ICD-10-CM | POA: Diagnosis not present

## 2022-04-13 DIAGNOSIS — Z9012 Acquired absence of left breast and nipple: Secondary | ICD-10-CM | POA: Diagnosis not present

## 2022-04-13 DIAGNOSIS — R110 Nausea: Secondary | ICD-10-CM | POA: Diagnosis not present

## 2022-04-13 DIAGNOSIS — Z79899 Other long term (current) drug therapy: Secondary | ICD-10-CM | POA: Insufficient documentation

## 2022-04-13 DIAGNOSIS — M810 Age-related osteoporosis without current pathological fracture: Secondary | ICD-10-CM | POA: Insufficient documentation

## 2022-04-13 DIAGNOSIS — D696 Thrombocytopenia, unspecified: Secondary | ICD-10-CM | POA: Diagnosis not present

## 2022-04-13 DIAGNOSIS — I10 Essential (primary) hypertension: Secondary | ICD-10-CM | POA: Insufficient documentation

## 2022-04-13 DIAGNOSIS — Z79811 Long term (current) use of aromatase inhibitors: Secondary | ICD-10-CM | POA: Insufficient documentation

## 2022-04-13 LAB — CMP (CANCER CENTER ONLY)
ALT: 17 U/L (ref 0–44)
AST: 22 U/L (ref 15–41)
Albumin: 3.8 g/dL (ref 3.5–5.0)
Alkaline Phosphatase: 85 U/L (ref 38–126)
Anion gap: 7 (ref 5–15)
BUN: 26 mg/dL — ABNORMAL HIGH (ref 8–23)
CO2: 27 mmol/L (ref 22–32)
Calcium: 9.6 mg/dL (ref 8.9–10.3)
Chloride: 108 mmol/L (ref 98–111)
Creatinine: 1.12 mg/dL — ABNORMAL HIGH (ref 0.44–1.00)
GFR, Estimated: 46 mL/min — ABNORMAL LOW (ref >60)
Glucose, Bld: 92 mg/dL (ref 70–99)
Potassium: 3.6 mmol/L (ref 3.5–5.1)
Sodium: 142 mmol/L (ref 135–145)
Total Bilirubin: 0.5 mg/dL (ref 0.3–1.2)
Total Protein: 6.3 g/dL — ABNORMAL LOW (ref 6.5–8.1)

## 2022-04-13 LAB — CBC WITH DIFFERENTIAL (CANCER CENTER ONLY)
Abs Immature Granulocytes: 0.02 10*3/uL (ref 0.00–0.07)
Basophils Absolute: 0 10*3/uL (ref 0.0–0.1)
Basophils Relative: 1 %
Eosinophils Absolute: 0.3 10*3/uL (ref 0.0–0.5)
Eosinophils Relative: 6 %
HCT: 36.2 % (ref 36.0–46.0)
Hemoglobin: 12.2 g/dL (ref 12.0–15.0)
Immature Granulocytes: 0 %
Lymphocytes Relative: 13 %
Lymphs Abs: 0.7 10*3/uL (ref 0.7–4.0)
MCH: 32.2 pg (ref 26.0–34.0)
MCHC: 33.7 g/dL (ref 30.0–36.0)
MCV: 95.5 fL (ref 80.0–100.0)
Monocytes Absolute: 0.4 10*3/uL (ref 0.1–1.0)
Monocytes Relative: 9 %
Neutro Abs: 3.6 10*3/uL (ref 1.7–7.7)
Neutrophils Relative %: 71 %
Platelet Count: 138 10*3/uL — ABNORMAL LOW (ref 150–400)
RBC: 3.79 MIL/uL — ABNORMAL LOW (ref 3.87–5.11)
RDW: 14.1 % (ref 11.5–15.5)
WBC Count: 5 10*3/uL (ref 4.0–10.5)
nRBC: 0 % (ref 0.0–0.2)

## 2022-04-13 MED ORDER — FULVESTRANT 250 MG/5ML IM SOSY
500.0000 mg | PREFILLED_SYRINGE | INTRAMUSCULAR | Status: DC
  Administered 2022-04-13: 500 mg via INTRAMUSCULAR
  Filled 2022-04-13: qty 10

## 2022-04-13 NOTE — Progress Notes (Signed)
[place]   Telephone:([phone]) [phone] Fax:(336) [phone]   Clinic Follow up Note   Patient Care Team: Lajean Manes, MD as PCP - [place], [place]if[place], MD as Consulting Physician (General Surgery) Truitt Merle, MD as Consulting Physician (Hematology) [name], MD as Consulting Physician (Radiation Oncology) Sylvan Cheese, NP as Nurse Practitioner (Hematology and Oncology) Karen Kays, NP as Nurse Practitioner (Nurse Practitioner)  Date of Service:  04/13/2022  CHIEF COMPLAINT: f/u of left breast cancer  CURRENT THERAPY:  -Faslodex injection, monthly, started 06/07/17. Held 09/26/19-04/29/20 -Ibrance 1m, 2 weeks on/1 week off starting 01/09/22  ASSESSMENT & PLAN:  Gloria SLEEPERis a 88y.o. post-hysterectomy female with   1. Cancer of the central portion of left female breast, Stage IIB, T2 N1 invasive lobular carcinoma, grade 2, ER+/PR+, HER-2-, local chest wall recurrence in 09/2015   -initially diagnosed in 07/2010, treated with mastectomy and adjuvant tamoxifen for 4.5 years.  -She developed local chest wall recurrence in 09/2015 and completed chest wall radiation. -she had another recurrence in 10/2015, s/p resection in 06/2016, surgical margins were positive. Dr. TMarlou Starksdid not offer more surgery because complete surgical resection was unlikely. Pt agreed.  -She started monthly fulvestrant injection on 06/07/17. Due to lost follow up and miscommunications, she was off treatment 09/26/19-04/29/20. She tolerated well aside from reaction to the injection site. -PET scan on 12/19/21 showed new and increasing abdominal retroperitoneal adenopathy. Given the lobular histology, which commonly metastases to abdomen, I think this is most likely cancer progression.  -she started Ibrance 75 mg on 01/09/22. She did not tolerate well, with fatigue and diarrhea, and stopped on 01/21/22.  -Labs reviewed and acceptable for faslodex injection today.  -reviewed guardant  360 with patient and son -No ESR 1 mutation per results. Continue Faslodex.  -F/u in 1 month.    2. HTN, CAD   -F/u with PCP and cards.    3. Osteoporosis -Her last DEXA scan in 06/2012 showed osteoporosis -I previously encouraged her to continue calcium and Vit D and to repeat DEXA at her PCP office   4. Goal of care discussion -The patient understands the goal of care is palliative. -she is full code for now     5. Fatigue- -d/t age and faslodex  6. Thrombocytopenia -Plt 136,000 -monitor -d/t faslodex  7. Nausea- -Continue Zofran prn -Recommend trying Zofran at bedtime nausea is persistently bothering her at night.  8.  Left mastectomy site discomfort- -Secondary to incision.  -No identifiable concern on exam. -Continue to monitor for now.  If Persistent please let uKoreaknow.  Plan  -Reviewed lab work and Guardant360 results -proceed with fulvestrant injection today and every 4 weeks -lab, f/u, and fulvestrant injection in 4 weeks   No problem-specific Assessment & Plan notes found for this encounter.   SUMMARY OF ONCOLOGIC HISTORY: Oncology History Overview Note  Cancer of central portion of left female breast ([facility]   Staging form: Breast, AJCC 7th Edition     Pathologic stage from 08/04/2010: Stage IIB (T2, N1a, cM0) - Signed by YTruitt Merle MD on 12/09/2015      [place]  08/03/2010 Mammogram    left nipple retraction, and the palpable mass subareolar left breast at the 6:00 position. Ultrasound confirmed the presence of a mass measuring 2.6 x 1.7 x 2.4 cm   08/04/2010 Initial Biopsy   left breast mass biopsy showed an invasive mammary carcinoma with lobular  features. ER 95%, PR 97%, Ki-67 17%, HER-2/neu (-)   08/04/2010 Pathologic Stage   Stage IIB: T2 N1a   08/11/2010 Imaging   left breast mass 4.6 x 4.2 x 2.3 cm with enhancement distortion extending to the nipple retraction. There is questionable anterior mediastinal  lymph node seen   07/2010 - 09/03/2010 Anti-estrogen oral therapy   neoadjuvant letrozole    09/03/2010 Surgery    left breast mastectomy with sentinel node biopsy on 09/03/2010.   08/2010 - 01/2015 Anti-estrogen oral therapy   Tamoxifen, pt stopped on her own    12/03/2010 - 01/23/2011 Radiation Therapy   left chest wall and axilla radiaiton    10/19/2015 Progression   Left chest wall recurrence, s/p resection on 11/25/2015 with positive margins ([facility])    12/10/2015 - 05/31/2017 Anti-estrogen oral therapy   anastrozole 37m daily, which was switched to Tamoxifen on 06/30/2016 due to persistent disease.   01/03/2016 - 03/03/2016 Radiation Therapy   Radiation to left chest wall recurrence (12/06/2015-02/04/2016 - 44 Gy), pt progressed through the radiation, and had additional 10 fractions of radiation (02/21/16-03/03/2016 - total 70 Gy to the left chest wall)   07/14/2016 PET scan   PET 07/14/2016 IMPRESSION: 1. No findings of recurrent malignancy. 2. Radiation fibrosis in the lingula. A small left axillary lymph node is not hypermetabolic and only 6 mm in diameter. This is in the vicinity of the prior axillary cyst. 3. Pelvic floor laxity.   07/17/2016 Surgery   Chest wall soft tissue resection for local recurrence   07/17/2016 Pathology Results   Left chest wall two soft tissue resection both showed invasive lobular carcinoma, margins were positive. ER 95% positive, PR 60% positive, HER-2 negative.   07/27/2016 Imaging   CT chest, abdomen and pelvis wo contrast  07/27/2016 IMPRESSION: Sigmoid diverticulosis without acute diverticulitis. No bowel obstruction or acute inflammation. Degenerative disc disease and facet arthropathy of the lower lumbar spine with slight grade 1 anterolisthesis of L4 on L5. Stable appearing simple left-sided renal cysts.   03/01/2017 Mammogram   Mammogram 03/01/2017 IMPRESSION: No mammographic evidence of malignancy. A result letter of this screening mammogram will be mailed  directly to the patient.  RECOMMENDATION: Screening mammogram in one year   04/10/2017 Imaging   PET IMPRESSION: 1. Stable exam.  No findings of recurrent malignancy. 2. Changes of external beam radiation again noted within the lingula. 3. Small left axillary lymph node exhibits mild increased uptake. Unchanged from previous exam.   06/07/2017 -  Anti-estrogen oral therapy   Stopped Tamoxifen and Began monthly Faslodex IM injection on 06/07/17  -Held after 09/26/19 due to lost f/u. Restart with loading dose on 04/29/20.    09/19/2017 Imaging   CT CAP WO Contrast 09/19/17 IMPRESSION: 1. Surgical changes from a left mastectomy without CT findings to suggest recurrent chest wall tumor or axillary lymphadenopathy. 2. No findings to suggest metastatic disease involving the neck, chest, abdomen or pelvis or osseous structures. 3. Stable radiation changes involving the lingula.    09/19/2017 Imaging   Bone Scan Whole Body  IMPRESSION: No definite scintigraphic evidence of osseous metastatic disease.  Scattered degenerative type uptake as above.   03/11/2018 PET scan   IMPRESSION:  1. No evidence of recurrent disease. 2.  Aortic atherosclerosis    09/03/2018 PET scan   09/03/2018 PET Scan IMPRESSION: 1. No findings to suggest locally recurrent disease or metastatic disease in the neck, chest, abdomen or pelvis. 2. Hepatic steatosis. 3. Colonic diverticulosis without evidence of  acute diverticulitis at this time. 4. Aortic atherosclerosis. 5. Additional incidental findings, as above.   11/25/2018 Imaging   DG Ribs Unilateral W/Chest Left  IMPRESSION: Stable left lingular scarring. Normal left ribs. No acute cardiopulmonary abnormality seen.    04/04/2019 PET scan   PET  IMPRESSION: 1. No evidence of hypermetabolic recurrent or metastatic disease. 2. Hepatic steatosis. 3.  Aortic Atherosclerosis (ICD10-I70.0).   05/18/2020 PET scan   IMPRESSION: 1. Findings of LEFT mastectomy  and axillary dissection with radiation changes, no signs of metastatic disease or disease recurrence at this time. 2. Hepatic steatosis. 3. Aortic atherosclerosis.   Aortic Atherosclerosis (ICD10-I70.0).     11/09/2020 PET scan   IMPRESSION: 1. No findings of hypermetabolic metastatic disease in the neck, chest, abdomen or pelvis. 2. Hepatic steatosis. 3. Aortic atherosclerosis. 4. Increased metabolism about the sphincter complex and lower rectum more likely physiologic; however, consider digital rectal exam and correlation with any symptoms in this location.   05/03/2021 PET scan   IMPRESSION: Status post left mastectomy with left axillary lymph node dissection. Radiation changes in the left upper lobe/lingula.   No evidence of recurrent or metastatic disease.   12/19/2021 PET scan   IMPRESSION: 1. New or increasing abdominal retroperitoneal adenopathy and soft tissue stranding/thickening along the left periaortic station, with mild hypermetabolism. Findings are worrisome for malignancy, possibly unrelated to breast cancer. 2. Left pelvicaliceal dilatation/obstruction with increasing left perinephric stranding. 3. Consider CT or MR abdomen pelvis without and with contrast in further evaluation, as clinically indicated. 4. Aortic atherosclerosis (ICD10-I70.0). Coronary artery calcification.      INTERVAL HISTORY:  Gloria [name] is here for a follow up of breast cancer.  She was last seen in clinic on 03/26/22.  Accompanied by her son today.  Overall feels well.  Notes some increasing fatigue over the past 1 to 2 months.  Occasional nausea when she rolls from side to side in bed.  Has intermittent left sided chest pulling sensation when she rolls from side to side.  This tend to resolve fairly quickly.No   Review of Systems  Constitutional:  Positive for malaise/fatigue.  Musculoskeletal:  Positive for myalgias.  All other systems reviewed and are negative.  All other systems  were reviewed with the patient and are negative.  MEDICAL HISTORY:  Past Medical History:  Diagnosis Date   Anxiety    Blood in urine    CAD (coronary artery disease)    non obstructive by cath   Cancer [facility])    breast- left   COVID    Depression    Family history of breast cancer    Family history of colon cancer    Family history of ovarian cancer    Family history of pancreatic cancer    Family history of prostate cancer    GERD (gastroesophageal reflux disease)    Hypertension    Knee fracture    Personal history of radiation therapy 2017   TR (tricuspid regurgitation)    mild by Echo 12/2008 EF >55%    SURGICAL HISTORY: Past Surgical History:  Procedure Laterality Date   ABDOMINAL AORTAGRAM N/A 09/08/2011   Procedure: ABDOMINAL Maxcine Ham;  Surgeon: Troy Sine, MD;  Location[facility] C[facility] LAB;  Service: Cardiovascular;  Laterality: N/A;   ABDOMINAL HYSTERECTOMY     BREAST EXCISIONAL BIOPSY Left 2017   X3   BREAST SURGERY  08-30-10   mastectomy   CARDIAC CATHETERIZATION  08/2011   20% LAD stenosis, 20% diagonal stenosis, 10-20%  proximal dominant RCA stenosis   COLON SURGERY     LEFT HEART CATHETERIZATION WITH CORONARY ANGIOGRAM N/A 09/08/2011   Procedure: LEFT HEART CATHETERIZATION WITH CORONARY ANGIOGRAM;  Surgeon: Troy Sine, MD;  Location[facility] LAB;  Service: Cardiovascular;  Laterality: N/A;   MASTECTOMY     MINOR BREAST BIOPSY Left 11/25/2015   Procedure: MINOR EXCISION MASS LEFT CHEST WALL;  Surgeon: Autumn Messing III, MD;  Location: [place];  Service: General;  Laterality: Left;   MINOR BREAST BIOPSY Left 07/17/2016   Procedure: EXCISION OF 2 LEFT CHEST WALL MASSES;  Surgeon: Autumn Messing III, MD;  Location: [place];  Service: General;  Laterality: Left;   TONSILLECTOMY      I have reviewed the social history and family history with the patient and they are unchanged from previous note.  ALLERGIES:  is allergic to contrast  media [iodinated contrast media], penicillins, adhesive [tape], cephalexin, ciprofloxacin, demerol, meperidine hcl, sulfa antibiotics, sulfamethoxazole-trimethoprim, and quinolones.  MEDICATIONS:  Current Outpatient Medications  Medication Sig Dispense Refill   acetaminophen (TYLENOL) 500 MG tablet Take 1,000 mg by mouth daily as needed for moderate pain. Reported on 03/10/2016     ALPRAZolam (XANAX) 0.25 MG tablet Take by mouth.     benzonatate (TESSALON) 100 MG capsule Take 1 capsule (100 mg total) by mouth every 8 (eight) hours. 21 capsule 0   CALCIUM-MAGNESIUM-VITAMIN D PO Take 1 tablet by mouth 2 (two) times daily.     doxepin (SINEQUAN) 10 MG capsule Take 10 mg by mouth at bedtime.     doxycycline (VIBRA-TABS) 100 MG tablet Take 1 tablet (100 mg total) by mouth 2 (two) times daily. 14 tablet 0   famotidine (PEPCID) 20 MG tablet Take 20 mg by mouth daily.     FLOVENT HFA 44 MCG/ACT inhaler Inhale into the lungs.     fluticasone (FLONASE) 50 MCG/ACT nasal spray Place 1 spray into both nostrils daily as needed for allergies.      guaiFENesin (MUCINEX) 600 MG 12 hr tablet Take 600 mg by mouth daily as needed for cough or to loosen phlegm. Reported on 03/28/2016     hydrochlorothiazide (MICROZIDE) 12.5 MG capsule Take 1 capsule (12.5 mg total) by mouth daily. Appointment needed continuation of refills-1st attempt 30 capsule 0   losartan (COZAAR) 25 MG tablet 25 mg at bedtime.      losartan (COZAAR) 50 MG tablet Take 1 tablet by mouth once daily 90 tablet 1   mupirocin ointment (BACTROBAN) 2 % Apply 1 application. topically 2 (two) times daily. 30 g 2   nitroGLYCERIN (NITROSTAT) 0.4 MG SL tablet Place 1 tablet (0.4 mg total) under the tongue every 5 (five) minutes as needed for chest pain. <PLEASE MAKE APPOINTMENT> 25 tablet 3   ondansetron (ZOFRAN) 4 MG tablet Take 1 tablet (4 mg total) by mouth every 8 (eight) hours as needed for nausea or vomiting. OK TO TAKE 2 TABS as needed if 1 tab does not  work 30 tablet 1   pseudoephedrine-acetaminophen (TYLENOL SINUS) 30-500 MG TABS tablet Take 1 tablet by mouth every 4 (four) hours as needed.     No current facility-administered medications for this visit.    PHYSICAL EXAMINATION: ECOG PERFORMANCE STATUS: 2 - Symptomatic, <50% confined to bed  Vitals:   04/13/22 1128  BP: 132/78  Pulse: 80  Resp: 17  Temp: 98.3 F (36.8 C)  SpO2: 99%   Wt Readings from Last 3 Encounters:  04/13/22  130 lb 8 oz (59.2 kg)  03/16/22 131 lb 14.4 oz (59.8 kg)  02/13/22 131 lb 12.8 oz (59.8 kg)    Physical Exam Constitutional:      Appearance: Normal appearance.  Cardiovascular:     Rate and Rhythm: Normal rate and regular rhythm.  Pulmonary:     Effort: Pulmonary effort is normal.     Breath sounds: Normal breath sounds.  Chest:  Breasts:    Left: Absent.     Comments: No abnormality to left mastectomy on exam today.  Abdominal:     General: Bowel sounds are normal.     Palpations: Abdomen is soft.  Musculoskeletal:        General: No swelling. Normal range of motion.  Neurological:     Mental Status: She is alert and oriented to person, place, and time. Mental status is at baseline.     LABORATORY DATA:  I have reviewed the data as listed    Latest Ref Rng & Units 04/13/2022   10:55 AM 03/16/2022    2:02 PM 02/13/2022    2:10 PM  CBC  WBC 4.0 - 10.5 K/uL 5.0  5.1  4.2   Hemoglobin 12.0 - 15.0 g/dL 12.2  11.4  11.1   Hematocrit 36.0 - 46.0 % 36.2  33.7  33.7   Platelets 150 - 400 K/uL 138  151  230         Latest Ref Rng & Units 04/13/2022   10:55 AM 03/16/2022    2:02 PM 02/13/2022    2:10 PM  CMP  Glucose 70 - 99 mg/dL 92  103  103   BUN 8 - 23 mg/dL 26  28  23    Creatinine 0.44 - 1.00 mg/dL 1.12  1.21  1.07   Sodium 135 - 145 mmol/L 142  137  140   Potassium 3.5 - 5.1 mmol/L 3.6  4.0  3.7   Chloride 98 - 111 mmol/L 108  104  105   CO2 22 - 32 mmol/L 27  28  28    Calcium 8.9 - 10.3 mg/dL 9.6  9.0  9.2   Total Protein  6.5 - 8.1 g/dL 6.3  6.4  6.4   Total Bilirubin 0.3 - 1.2 mg/dL 0.5  0.5  0.4   Alkaline Phos 38 - 126 U/L 85  74  73   AST 15 - 41 U/L 22  21  20    ALT 0 - 44 U/L 17  12  12        RADIOGRAPHIC STUDIES: I have personally reviewed the radiological images as listed and agreed with the findings in the report. No results found.    No orders of the defined types were placed in this encounter.  I spent 20 minutes dedicated to the care of this patient (face-to-face and non-face-to-face) on the date of the encounter to include what is described in the assessment and plan.     Jacquelin Hawking, NP 04/13/2022

## 2022-04-14 ENCOUNTER — Telehealth: Payer: Self-pay | Admitting: Hematology

## 2022-04-14 NOTE — Telephone Encounter (Signed)
Left message with follow-up appointment per 6/15 los.

## 2022-04-25 ENCOUNTER — Other Ambulatory Visit: Payer: Self-pay | Admitting: Cardiovascular Disease

## 2022-04-25 DIAGNOSIS — F3342 Major depressive disorder, recurrent, in full remission: Secondary | ICD-10-CM | POA: Diagnosis not present

## 2022-04-25 DIAGNOSIS — I10 Essential (primary) hypertension: Secondary | ICD-10-CM | POA: Diagnosis not present

## 2022-04-25 DIAGNOSIS — D692 Other nonthrombocytopenic purpura: Secondary | ICD-10-CM | POA: Diagnosis not present

## 2022-04-25 DIAGNOSIS — H9193 Unspecified hearing loss, bilateral: Secondary | ICD-10-CM | POA: Diagnosis not present

## 2022-04-25 DIAGNOSIS — C50912 Malignant neoplasm of unspecified site of left female breast: Secondary | ICD-10-CM | POA: Diagnosis not present

## 2022-04-25 DIAGNOSIS — Z9012 Acquired absence of left breast and nipple: Secondary | ICD-10-CM | POA: Diagnosis not present

## 2022-04-25 DIAGNOSIS — Z974 Presence of external hearing-aid: Secondary | ICD-10-CM | POA: Diagnosis not present

## 2022-04-27 ENCOUNTER — Other Ambulatory Visit: Payer: Self-pay

## 2022-04-27 ENCOUNTER — Telehealth: Payer: Self-pay

## 2022-04-27 NOTE — Telephone Encounter (Signed)
This nurse reached out to patients daughter Gloria [name], related to message she left stating patient has some swelling in lymph nodes and complaints of sore throat, denies that patient has had any fevers.  Spoke with provider who suggested that patient goes to see PCP to rule out strep throat.  Daughter acknowledged understanding.  No further questions or concerns at this time.

## 2022-05-09 ENCOUNTER — Other Ambulatory Visit: Payer: Self-pay

## 2022-05-11 ENCOUNTER — Inpatient Hospital Stay: Payer: PPO

## 2022-05-11 ENCOUNTER — Inpatient Hospital Stay: Payer: PPO | Admitting: Hematology

## 2022-05-17 DIAGNOSIS — D225 Melanocytic nevi of trunk: Secondary | ICD-10-CM | POA: Diagnosis not present

## 2022-05-19 ENCOUNTER — Inpatient Hospital Stay: Payer: PPO

## 2022-05-19 ENCOUNTER — Inpatient Hospital Stay (HOSPITAL_BASED_OUTPATIENT_CLINIC_OR_DEPARTMENT_OTHER): Payer: PPO | Admitting: Hematology

## 2022-05-19 ENCOUNTER — Other Ambulatory Visit (HOSPITAL_COMMUNITY): Payer: Self-pay

## 2022-05-19 ENCOUNTER — Inpatient Hospital Stay: Payer: PPO | Attending: Hematology

## 2022-05-19 ENCOUNTER — Encounter: Payer: Self-pay | Admitting: Hematology

## 2022-05-19 VITALS — BP 135/82 | HR 68 | Temp 97.9°F | Resp 17 | Ht 61.0 in | Wt 128.3 lb

## 2022-05-19 DIAGNOSIS — R590 Localized enlarged lymph nodes: Secondary | ICD-10-CM | POA: Insufficient documentation

## 2022-05-19 DIAGNOSIS — K760 Fatty (change of) liver, not elsewhere classified: Secondary | ICD-10-CM | POA: Insufficient documentation

## 2022-05-19 DIAGNOSIS — Z8041 Family history of malignant neoplasm of ovary: Secondary | ICD-10-CM | POA: Insufficient documentation

## 2022-05-19 DIAGNOSIS — Z170 Estrogen receptor positive status [ER+]: Secondary | ICD-10-CM | POA: Diagnosis not present

## 2022-05-19 DIAGNOSIS — Z8042 Family history of malignant neoplasm of prostate: Secondary | ICD-10-CM | POA: Diagnosis not present

## 2022-05-19 DIAGNOSIS — I10 Essential (primary) hypertension: Secondary | ICD-10-CM | POA: Diagnosis not present

## 2022-05-19 DIAGNOSIS — Z803 Family history of malignant neoplasm of breast: Secondary | ICD-10-CM | POA: Diagnosis not present

## 2022-05-19 DIAGNOSIS — Z800 Family history of malignant neoplasm of digestive organs: Secondary | ICD-10-CM | POA: Insufficient documentation

## 2022-05-19 DIAGNOSIS — C7989 Secondary malignant neoplasm of other specified sites: Secondary | ICD-10-CM | POA: Insufficient documentation

## 2022-05-19 DIAGNOSIS — K219 Gastro-esophageal reflux disease without esophagitis: Secondary | ICD-10-CM | POA: Diagnosis not present

## 2022-05-19 DIAGNOSIS — Z9012 Acquired absence of left breast and nipple: Secondary | ICD-10-CM | POA: Insufficient documentation

## 2022-05-19 DIAGNOSIS — Z79811 Long term (current) use of aromatase inhibitors: Secondary | ICD-10-CM | POA: Insufficient documentation

## 2022-05-19 DIAGNOSIS — C50112 Malignant neoplasm of central portion of left female breast: Secondary | ICD-10-CM

## 2022-05-19 DIAGNOSIS — Z7951 Long term (current) use of inhaled steroids: Secondary | ICD-10-CM | POA: Insufficient documentation

## 2022-05-19 DIAGNOSIS — M810 Age-related osteoporosis without current pathological fracture: Secondary | ICD-10-CM | POA: Insufficient documentation

## 2022-05-19 DIAGNOSIS — Z79899 Other long term (current) drug therapy: Secondary | ICD-10-CM | POA: Diagnosis not present

## 2022-05-19 DIAGNOSIS — I700 Atherosclerosis of aorta: Secondary | ICD-10-CM | POA: Diagnosis not present

## 2022-05-19 DIAGNOSIS — Z923 Personal history of irradiation: Secondary | ICD-10-CM | POA: Insufficient documentation

## 2022-05-19 DIAGNOSIS — I2510 Atherosclerotic heart disease of native coronary artery without angina pectoris: Secondary | ICD-10-CM | POA: Insufficient documentation

## 2022-05-19 LAB — CBC WITH DIFFERENTIAL (CANCER CENTER ONLY)
Abs Immature Granulocytes: 0.01 10*3/uL (ref 0.00–0.07)
Basophils Absolute: 0.1 10*3/uL (ref 0.0–0.1)
Basophils Relative: 1 %
Eosinophils Absolute: 0.3 10*3/uL (ref 0.0–0.5)
Eosinophils Relative: 6 %
HCT: 35.8 % — ABNORMAL LOW (ref 36.0–46.0)
Hemoglobin: 11.9 g/dL — ABNORMAL LOW (ref 12.0–15.0)
Immature Granulocytes: 0 %
Lymphocytes Relative: 15 %
Lymphs Abs: 0.8 10*3/uL (ref 0.7–4.0)
MCH: 31.9 pg (ref 26.0–34.0)
MCHC: 33.2 g/dL (ref 30.0–36.0)
MCV: 96 fL (ref 80.0–100.0)
Monocytes Absolute: 0.5 10*3/uL (ref 0.1–1.0)
Monocytes Relative: 10 %
Neutro Abs: 3.5 10*3/uL (ref 1.7–7.7)
Neutrophils Relative %: 68 %
Platelet Count: 159 10*3/uL (ref 150–400)
RBC: 3.73 MIL/uL — ABNORMAL LOW (ref 3.87–5.11)
RDW: 13.3 % (ref 11.5–15.5)
WBC Count: 5.1 10*3/uL (ref 4.0–10.5)
nRBC: 0 % (ref 0.0–0.2)

## 2022-05-19 LAB — CMP (CANCER CENTER ONLY)
ALT: 12 U/L (ref 0–44)
AST: 23 U/L (ref 15–41)
Albumin: 4 g/dL (ref 3.5–5.0)
Alkaline Phosphatase: 68 U/L (ref 38–126)
Anion gap: 5 (ref 5–15)
BUN: 23 mg/dL (ref 8–23)
CO2: 28 mmol/L (ref 22–32)
Calcium: 9.2 mg/dL (ref 8.9–10.3)
Chloride: 105 mmol/L (ref 98–111)
Creatinine: 1.16 mg/dL — ABNORMAL HIGH (ref 0.44–1.00)
GFR, Estimated: 44 mL/min — ABNORMAL LOW (ref >60)
Glucose, Bld: 79 mg/dL (ref 70–99)
Potassium: 4 mmol/L (ref 3.5–5.1)
Sodium: 138 mmol/L (ref 135–145)
Total Bilirubin: 0.6 mg/dL (ref 0.3–1.2)
Total Protein: 6.3 g/dL — ABNORMAL LOW (ref 6.5–8.1)

## 2022-05-19 MED ORDER — EVEROLIMUS 5 MG PO TABS
5.0000 mg | ORAL_TABLET | Freq: Every day | ORAL | 0 refills | Status: DC
  Filled 2022-05-19: qty 30, 30d supply, fill #0

## 2022-05-19 MED ORDER — EXEMESTANE 25 MG PO TABS
25.0000 mg | ORAL_TABLET | Freq: Every day | ORAL | 2 refills | Status: DC
  Filled 2022-05-19 – 2022-06-02 (×3): qty 30, 30d supply, fill #0

## 2022-05-19 NOTE — Progress Notes (Signed)
[place]   Telephone:(336) [phone] Fax:([phone]) [phone]   Clinic Follow up Note   Patient Care Team: Lajean Manes, MD as PCP - Hockinson, Clarendon, MD as Consulting Physician (General Surgery) Truitt Merle, MD as Consulting Physician (Hematology) [name], MD as Consulting Physician (Radiation Oncology) Sylvan Cheese, NP as Nurse Practitioner (Hematology and Oncology) Karen Kays, NP as Nurse Practitioner (Nurse Practitioner)  Date of Service:  05/19/2022  CHIEF COMPLAINT: f/u of left breast cancer  CURRENT THERAPY:  -Ibrance 52m, 2 weeks on/1 week off starting 01/09/22  ASSESSMENT & PLAN:  Gloria GLOMSKIis a 88y.o. post-hysterectomy female with   1. Cancer of the central portion of left female breast, Stage IIB, T2 N1 invasive lobular carcinoma, grade 2, ER+/PR+, HER-2-, local chest wall recurrence in 09/2015, node mets in 11/2021 -initially diagnosed in 07/2010, treated with mastectomy and adjuvant tamoxifen for 4.5 years.  -She developed local chest wall recurrence in 09/2015 and completed chest wall radiation. -she had another recurrence in 10/2015, s/p resection in 06/2016, surgical margins were positive. Dr. TMarlou Starksdid not offer more surgery because complete surgical resection was unlikely. Pt agreed.  -She started monthly fulvestrant injection on 06/07/17. Due to lost follow up and miscommunications, she was off treatment 09/26/19 - 04/29/20. She tolerated well aside from reaction to the injection site. -PET scan on 12/19/21 showed new and increasing abdominal retroperitoneal adenopathy. Given the lobular histology, which commonly metastases to abdomen, I think this is most likely cancer progression.  -she took Ibrance 75 mg 01/09/22, stopped after two weeks due to fatigue and diarrhea. -Guardant 360 03/16/22 showed no ESR 1 mutation or other targetable mutations .  -she reports she is experiencing worsening pain to the injection site with each  treatment. I also do not feel the injections are controlling her cancer. Given these, I discussed switching her to oral Afinitor and exemestane.  Potential benefit and side effects, especially Hoyt Leanos of 2012, or discussed with her and her daughter in detail.  She agrees to try. Our oral pharmacist will reach out to discuss Afinitor with them. She can start exemestane when she receives it.  We will start her on fentanyl at a reduced dose of 5 mg daily -plan for PET scan in the next month for restaging    2. HTN, CAD   -F/u with PCP and cardiology.    3. Osteoporosis -Her last DEXA scan in 06/2012 showed osteoporosis -I previously encouraged her to continue calcium and Vit D and to repeat DEXA at her PCP office   4. Goal of care discussion -The patient understands the goal of care is palliative. -she is full code for now       Plan  -start exemestane, I prescribed today -I prescribed Afinitor 555mdaily, pharmacist ReWells Guilesill reach out to them -PET scan in 3-4 weeks -lab and f/u in 1 month   No problem-specific Assessment & Plan notes found for this encounter.   SUMMARY OF ONCOLOGIC HISTORY: Oncology History Overview Note  Cancer of central portion of left female breast ([facility]  Staging form: Breast, AJCC 7th Edition     Pathologic stage from 08/04/2010: Stage IIB (T2, N1a, cM0) - Signed by YaTruitt MerleMD on 12/09/2015      Cancer of central portion of left female breast ([facility] 08/03/2010 Mammogram    left nipple retraction, and the palpable mass subareolar left breast at the 6:00 position. Ultrasound confirmed the presence of a  mass measuring 2.6 x 1.7 x 2.4 cm   08/04/2010 Initial Biopsy   left breast mass biopsy showed an invasive mammary carcinoma with lobular features. ER 95%, PR 97%, Ki-67 17%, HER-2/neu (-)   08/04/2010 Pathologic Stage   Stage IIB: T2 N1a   08/11/2010 Imaging   left breast mass 4.6 x 4.2 x 2.3 cm with enhancement distortion extending to the nipple retraction.  There is questionable anterior mediastinal lymph node seen   07/2010 - 09/03/2010 Anti-estrogen oral therapy   neoadjuvant letrozole    09/03/2010 Surgery    left breast mastectomy with sentinel node biopsy on 09/03/2010.   08/2010 - 01/2015 Anti-estrogen oral therapy   Tamoxifen, pt stopped on her own    12/03/2010 - 01/23/2011 Radiation Therapy   left chest wall and axilla radiaiton    10/19/2015 Progression   Left chest wall recurrence, s/p resection on 11/25/2015 with positive margins (ILC)    12/10/2015 - 05/31/2017 Anti-estrogen oral therapy   anastrozole 81m daily, which was switched to Tamoxifen on 06/30/2016 due to persistent disease.   01/03/2016 - 03/03/2016 Radiation Therapy   Radiation to left chest wall recurrence (12/06/2015-02/04/2016 - 44 Gy), pt progressed through the radiation, and had additional 10 fractions of radiation (02/21/16-03/03/2016 - total 70 Gy to the left chest wall)   07/14/2016 PET scan   PET 07/14/2016 IMPRESSION: 1. No findings of recurrent malignancy. 2. Radiation fibrosis in the lingula. A small left axillary lymph node is not hypermetabolic and only 6 mm in diameter. This is in the vicinity of the prior axillary cyst. 3. Pelvic floor laxity.   07/17/2016 Surgery   Chest wall soft tissue resection for local recurrence   07/17/2016 Pathology Results   Left chest wall two soft tissue resection both showed invasive lobular carcinoma, margins were positive. ER 95% positive, PR 60% positive, HER-2 negative.   07/27/2016 Imaging   CT chest, abdomen and pelvis wo contrast  07/27/2016 IMPRESSION: Sigmoid diverticulosis without acute diverticulitis. No bowel obstruction or acute inflammation. Degenerative disc disease and facet arthropathy of the lower lumbar spine with slight grade 1 anterolisthesis of L4 on L5. Stable appearing simple left-sided renal cysts.   03/01/2017 Mammogram   Mammogram 03/01/2017 IMPRESSION: No mammographic evidence of malignancy. A result letter  of this screening mammogram will be mailed directly to the patient.  RECOMMENDATION: Screening mammogram in one year   04/10/2017 Imaging   PET IMPRESSION: 1. Stable exam.  No findings of recurrent malignancy. 2. Changes of external beam radiation again noted within the lingula. 3. Small left axillary lymph node exhibits mild increased uptake. Unchanged from previous exam.   06/07/2017 -  Anti-estrogen oral therapy   Stopped Tamoxifen and Began monthly Faslodex IM injection on 06/07/17  -Held after 09/26/19 due to lost f/u. Restart with loading dose on 04/29/20.    09/19/2017 Imaging   CT CAP WO Contrast 09/19/17 IMPRESSION: 1. Surgical changes from a left mastectomy without CT findings to suggest recurrent chest wall tumor or axillary lymphadenopathy. 2. No findings to suggest metastatic disease involving the neck, chest, abdomen or pelvis or osseous structures. 3. Stable radiation changes involving the lingula.    09/19/2017 Imaging   Bone Scan Whole Body  IMPRESSION: No definite scintigraphic evidence of osseous metastatic disease.  Scattered degenerative type uptake as above.   03/11/2018 PET scan   IMPRESSION:  1. No evidence of recurrent disease. 2.  Aortic atherosclerosis    09/03/2018 PET scan   09/03/2018 PET Scan IMPRESSION: 1.  No findings to suggest locally recurrent disease or metastatic disease in the neck, chest, abdomen or pelvis. 2. Hepatic steatosis. 3. Colonic diverticulosis without evidence of acute diverticulitis at this time. 4. Aortic atherosclerosis. 5. Additional incidental findings, as above.   11/25/2018 Imaging   DG Ribs Unilateral W/Chest Left  IMPRESSION: Stable left lingular scarring. Normal left ribs. No acute cardiopulmonary abnormality seen.    04/04/2019 PET scan   PET  IMPRESSION: 1. No evidence of hypermetabolic recurrent or metastatic disease. 2. Hepatic steatosis. 3.  Aortic Atherosclerosis (ICD10-I70.0).   05/18/2020 PET scan    IMPRESSION: 1. Findings of LEFT mastectomy and axillary dissection with radiation changes, no signs of metastatic disease or disease recurrence at this time. 2. Hepatic steatosis. 3. Aortic atherosclerosis.   Aortic Atherosclerosis (ICD10-I70.0).     11/09/2020 PET scan   IMPRESSION: 1. No findings of hypermetabolic metastatic disease in the neck, chest, abdomen or pelvis. 2. Hepatic steatosis. 3. Aortic atherosclerosis. 4. Increased metabolism about the sphincter complex and lower rectum more likely physiologic; however, consider digital rectal exam and correlation with any symptoms in this location.   05/03/2021 PET scan   IMPRESSION: Status post left mastectomy with left axillary lymph node dissection. Radiation changes in the left upper lobe/lingula.   No evidence of recurrent or metastatic disease.   12/19/2021 PET scan   IMPRESSION: 1. New or increasing abdominal retroperitoneal adenopathy and soft tissue stranding/thickening along the left periaortic station, with mild hypermetabolism. Findings are worrisome for malignancy, possibly unrelated to breast cancer. 2. Left pelvicaliceal dilatation/obstruction with increasing left perinephric stranding. 3. Consider CT or MR abdomen pelvis without and with contrast in further evaluation, as clinically indicated. 4. Aortic atherosclerosis (ICD10-I70.0). Coronary artery calcification.      INTERVAL HISTORY:  Gloria [name] is here for a follow up of breast cancer. She was last seen by NP Anderson Malta on 04/13/22. She presents to the clinic accompanied by her daughter. She reports she is doing well overall. She notes intermittent pains all over her body, at least partly related to her arthritis. She reports the injections are becoming more painful the more she gets them.   All other systems were reviewed with the patient and are negative.  MEDICAL HISTORY:  Past Medical History:  Diagnosis Date   Anxiety    Blood in urine     CAD (coronary artery disease)    non obstructive by cath   Cancer [facility])    breast- left   COVID    Depression    Family history of breast cancer    Family history of colon cancer    Family history of ovarian cancer    Family history of pancreatic cancer    Family history of prostate cancer    GERD (gastroesophageal reflux disease)    Hypertension    Knee fracture    Personal history of radiation therapy 2017   TR (tricuspid regurgitation)    mild by Echo 12/2008 EF >55%    SURGICAL HISTORY: Past Surgical History:  Procedure Laterality Date   ABDOMINAL AORTAGRAM N/A 09/08/2011   Procedure: ABDOMINAL Maxcine Ham;  Surgeon: Troy Sine, MD;  Location: [facility] CATH LAB;  Service: Cardiovascular;  Laterality: N/A;   ABDOMINAL HYSTERECTOMY     BREAST EXCISIONAL BIOPSY Left 2017   X3   BREAST SURGERY  08-30-10   mastectomy   CARDIAC CATHETERIZATION  08/2011   20% LAD stenosis, 20% diagonal stenosis, 10-20% proximal dominant RCA stenosis   COLON SURGERY  LEFT HEART CATHETERIZATION WITH CORONARY ANGIOGRAM N/A 09/08/2011   Procedure: LEFT HEART CATHETERIZATION WITH CORONARY ANGIOGRAM;  Surgeon: Troy Sine, MD;  Location: [facility] CATH LAB;  Service: Cardiovascular;  Laterality: N/A;   MASTECTOMY     MINOR BREAST BIOPSY Left 11/25/2015   Procedure: MINOR EXCISION MASS LEFT CHEST WALL;  Surgeon: Autumn Messing III, MD;  Location: [place];  Service: General;  Laterality: Left;   MINOR BREAST BIOPSY Left 07/17/2016   Procedure: EXCISION OF 2 LEFT CHEST WALL MASSES;  Surgeon: Autumn Messing III, MD;  Location: [place];  Service: General;  Laterality: Left;   TONSILLECTOMY      I have reviewed the social history and family history with the patient and they are unchanged from previous note.  ALLERGIES:  is allergic to contrast media [iodinated contrast media], penicillins, adhesive [tape], cephalexin, ciprofloxacin, demerol, meperidine hcl, sulfa antibiotics,  sulfamethoxazole-trimethoprim, and quinolones.  MEDICATIONS:  Current Outpatient Medications  Medication Sig Dispense Refill   everolimus (AFINITOR) 5 MG tablet Take 1 tablet (5 mg total) by mouth daily. 30 tablet 0   exemestane (AROMASIN) 25 MG tablet Take 1 tablet (25 mg total) by mouth daily after breakfast. 30 tablet 2   acetaminophen (TYLENOL) 500 MG tablet Take 1,000 mg by mouth daily as needed for moderate pain. Reported on 03/10/2016     ALPRAZolam (XANAX) 0.25 MG tablet Take by mouth.     benzonatate (TESSALON) 100 MG capsule Take 1 capsule (100 mg total) by mouth every 8 (eight) hours. 21 capsule 0   CALCIUM-MAGNESIUM-VITAMIN D PO Take 1 tablet by mouth 2 (two) times daily.     doxepin (SINEQUAN) 10 MG capsule Take 10 mg by mouth at bedtime.     doxycycline (VIBRA-TABS) 100 MG tablet Take 1 tablet (100 mg total) by mouth 2 (two) times daily. 14 tablet 0   famotidine (PEPCID) 20 MG tablet Take 20 mg by mouth daily.     FLOVENT HFA 44 MCG/ACT inhaler Inhale into the lungs.     fluticasone (FLONASE) 50 MCG/ACT nasal spray Place 1 spray into both nostrils daily as needed for allergies.      guaiFENesin (MUCINEX) 600 MG 12 hr tablet Take 600 mg by mouth daily as needed for cough or to loosen phlegm. Reported on 03/28/2016     hydrochlorothiazide (MICROZIDE) 12.5 MG capsule Take 1 capsule (12.5 mg total) by mouth daily. Appointment needed continuation of refills-1st attempt 30 capsule 0   losartan (COZAAR) 25 MG tablet 25 mg at bedtime.      losartan (COZAAR) 50 MG tablet Take 1 tablet by mouth once daily 90 tablet 0   mupirocin ointment (BACTROBAN) 2 % Apply 1 application. topically 2 (two) times daily. 30 g 2   nitroGLYCERIN (NITROSTAT) 0.4 MG SL tablet Place 1 tablet (0.4 mg total) under the tongue every 5 (five) minutes as needed for chest pain. <PLEASE MAKE APPOINTMENT> 25 tablet 3   ondansetron (ZOFRAN) 4 MG tablet Take 1 tablet (4 mg total) by mouth every 8 (eight) hours as needed for  nausea or vomiting. OK TO TAKE 2 TABS as needed if 1 tab does not work 30 tablet 1   pseudoephedrine-acetaminophen (TYLENOL SINUS) 30-500 MG TABS tablet Take 1 tablet by mouth every 4 (four) hours as needed.     No current facility-administered medications for this visit.    PHYSICAL EXAMINATION: ECOG PERFORMANCE STATUS: 3 - Symptomatic, >50% confined to bed  Vitals:   05/19/22 1340  BP: 135/82  Pulse: 68  Resp: 17  Temp: 97.9 F (36.6 C)  SpO2: 100%   Wt Readings from Last 3 Encounters:  05/19/22 128 lb 4.8 oz (58.2 kg)  04/13/22 130 lb 8 oz (59.2 kg)  03/16/22 131 lb 14.4 oz (59.8 kg)     GENERAL:alert, no distress and comfortable SKIN: skin color normal, no rashes, (+) lesion to upper right back EYES: normal, Conjunctiva are pink and non-injected, sclera clear  NEURO: alert & oriented x 3 with fluent speech  LABORATORY DATA:  I have reviewed the data as listed    Latest Ref Rng & Units 05/19/2022   12:52 PM 04/13/2022   10:55 AM 03/16/2022    2:02 PM  CBC  WBC 4.0 - 10.5 K/uL 5.1  5.0  5.1   Hemoglobin 12.0 - 15.0 g/dL 11.9  12.2  11.4   Hematocrit 36.0 - 46.0 % 35.8  36.2  33.7   Platelets 150 - 400 K/uL 159  138  151         Latest Ref Rng & Units 05/19/2022   12:52 PM 04/13/2022   10:55 AM 03/16/2022    2:02 PM  CMP  Glucose 70 - 99 mg/dL 79  92  103   BUN 8 - 23 mg/dL _0 Creatinine 0.44 - 1.00 mg/dL 1.16  1.12  1.21   Sodium 135 - 145 mmol/L 138  142  137   Potassium 3.5 - 5.1 mmol/L 4.0  3.6  4.0   Chloride 98 - 111 mmol/L 105  108  104   CO2 22 - 32 mmol/L _1 Calcium 8.9 - 10.3 mg/dL 9.2  9.6  9.0   Total Protein 6.5 - 8.1 g/dL 6.3  6.3  6.4   Total Bilirubin 0.3 - 1.2 mg/dL 0.6  0.5  0.5   Alkaline Phos 38 - 126 U/L 68  85  74   AST 15 - 41 U/L _2 ALT 0 - 44 U/L _3 RADIOGRAPHIC STUDIES: I have personally reviewed the radiological images as listed and agreed with the findings in the report. No results  found.    Orders Placed This Encounter  Procedures   NM PET Image Restag (PS) Skull Base To Thigh    Standing Status:   Future    Standing Expiration Date:   05/19/2023    Order Specific Question:   If indicated for the ordered procedure, I authorize the administration of a radiopharmaceutical per Radiology protocol    Answer:   Yes    Order Specific Question:   Preferred imaging location?    Answer:   [facility]loria [name]   All questions were answered. The patient knows to call the clinic with any problems, questions or concerns. No barriers to learning was detected. The total time spent in the appointment was 30 minutes.     Truitt Merle, MD 05/19/2022   I, Wilburn Mylar, am acting as scribe for Truitt Merle, MD.   I have reviewed the above documentation for accuracy and completeness, and I agree with the above.

## 2022-05-22 ENCOUNTER — Telehealth: Payer: Self-pay | Admitting: Pharmacy Technician

## 2022-05-22 ENCOUNTER — Telehealth: Payer: Self-pay | Admitting: Pharmacist

## 2022-05-22 ENCOUNTER — Other Ambulatory Visit (HOSPITAL_COMMUNITY): Payer: Self-pay

## 2022-05-22 NOTE — Telephone Encounter (Signed)
Oral Oncology Patient Advocate Encounter   Received notification that prior authorization for Everolimus is required.   PA submitted on 05/22/2022 Key [id number]  Status is pending     Lady Deutscher, CPhT-Adv Pharmacy Patient Advocate Specialist [place] Patient Advocate Team Direct Number: [phone]  Fax: [phone]

## 2022-05-22 NOTE — Telephone Encounter (Signed)
Oral Oncology Pharmacist Encounter  Received new prescription for Afinitor (everolimus) for the treatment of metastatic breast cancer, ER/PR positive, HER-2 negative in conjunction with exemestane, planned duration until disease progression or unacceptable drug toxicity.  CBC w/ Diff and CMP from 05/19/22 assessed, noted pt Scr of 1.16 mg/dL (CrCl ~28 mL/min). No renal dose adjustments required. Prescription dose and frequency assessed for appropriateness.   Current medication list in Epic reviewed, DDIs with Afinitor identified: Category C drug-drug interaction between Afinitor and Xanax - Afinitor, a weak CYP3A4 inhibitor may increase serum concentrations of Xanax. Recommend monitoring for increased ADEs from Xanax including but not limited to drowsiness/over sedation. No change in therapy warranted at this time.  Evaluated chart and no patient barriers to medication adherence noted.   Patient agreement for treatment documented in MD note on 05/19/22.  Prescription has been e-scribed to the [facility] for benefits analysis and approval.  Oral Oncology Clinic will continue to follow for insurance authorization, copayment issues, initial counseling and start date.  Leron Croak, PharmD, BCPS, BCOP Hematology/Oncology Clinical Pharmacist [facility] and [place] [phone] 05/22/2022 9:10 AM

## 2022-05-22 NOTE — Telephone Encounter (Addendum)
Oral Oncology Patient Advocate Encounter   Began application for assistance for Afinitor through [employer] PANO/NPAF.   Application will be submitted upon completion of necessary supporting documentation.   PANO phone number [phone].   I will continue to check the status until final determination.   Lady Deutscher, CPhT-Adv Pharmacy Patient Advocate Specialist [place] Patient Advocate Team Direct Number: [phone]  Fax: [phone]

## 2022-05-22 NOTE — Telephone Encounter (Signed)
Oral Oncology Patient Advocate Encounter  Prior Authorization for Everolimus has been approved.    PA# [id number] Effective dates: 05/22/2022 through 05/22/2023  Patients co-pay is $3,135.30.    Lady Deutscher, CPhT-Adv Pharmacy Patient Advocate Specialist [place] Patient Advocate Team Direct Number: [phone]  Fax: [phone]

## 2022-05-23 ENCOUNTER — Other Ambulatory Visit (HOSPITAL_COMMUNITY): Payer: Self-pay

## 2022-05-23 ENCOUNTER — Telehealth: Payer: Self-pay | Admitting: Hematology

## 2022-05-23 ENCOUNTER — Encounter: Payer: Self-pay | Admitting: Hematology

## 2022-05-23 NOTE — Telephone Encounter (Signed)
Scheduled follow-up appointment per 7/21 los. Patient's daughter is aware.

## 2022-05-24 NOTE — Telephone Encounter (Signed)
Oral Oncology Patient Advocate Encounter  Submitted final forms to [facility] to be added to the application for assistance for Afinitor    [facility] phone [phone]  I will continue to check status until final determination.   Lady Deutscher, CPhT-Adv Pharmacy Patient Advocate Specialist [place] Patient Advocate Team Direct Number: [phone]  Fax: [phone]

## 2022-05-26 DIAGNOSIS — N3020 Other chronic cystitis without hematuria: Secondary | ICD-10-CM | POA: Diagnosis not present

## 2022-05-26 DIAGNOSIS — R3915 Urgency of urination: Secondary | ICD-10-CM | POA: Diagnosis not present

## 2022-05-26 NOTE — Telephone Encounter (Signed)
Oral Oncology Patient Advocate Encounter  Received update via fax from [facility] regarding patient assistance application for AFINITOR.  Status is pending full approval.   Free 14 day trial has been approved.  PANO phone [phone]  I will continue to check status until final determination.   Lady Deutscher, CPhT-Adv Pharmacy Patient Advocate Specialist [place] Patient Advocate Team Direct Number: [phone]  Fax: [phone]

## 2022-05-31 NOTE — Telephone Encounter (Signed)
Oral Oncology Patient Advocate Encounter   Received notification that the application for assistance for Afinitor through NPAF has been approved.   NPAF phone number [phone].   Effective dates: 05/31/2022 through 10/29/2022  I have spoken to the patient.   Lady Deutscher, CPhT-Adv Pharmacy Patient Advocate Specialist [place] Patient Advocate Team Direct Number: [phone]  Fax: [phone]

## 2022-06-02 ENCOUNTER — Other Ambulatory Visit: Payer: Self-pay

## 2022-06-02 ENCOUNTER — Encounter: Payer: Self-pay | Admitting: Hematology

## 2022-06-02 ENCOUNTER — Other Ambulatory Visit (HOSPITAL_COMMUNITY): Payer: Self-pay

## 2022-06-02 MED ORDER — DEXAMETHASONE 0.5 MG/5ML PO SOLN
1.0000 mg | Freq: Every day | ORAL | 4 refills | Status: DC
  Filled 2022-06-02: qty 500, 12d supply, fill #0

## 2022-06-02 NOTE — Telephone Encounter (Signed)
Oral Chemotherapy Pharmacist Encounter  I spoke with patient's daughter, Gloria [name], for overview of: Afinitor (everolimus) for the treatment of metastatic breast cancer, ER/PR positive, HER-2 negative in conjunction with exemestane, planned duration until disease progression or unacceptable drug toxicity.  Counseled on administration, dosing, side effects, monitoring, drug-food interactions, safe handling, storage, and disposal.  Patient will take Afinitor 50m tablets, 1 tablet by mouth once daily, with water, without regard to food.  Patient's daughter understands that Afinitor will need to be consistently with regards to food and at approximately the same time each day.  Patient's daughter knows Ms. PMuscatellowill need to avoid grapefruit or grapefruit juice while on therapy with Afinitor.  Afinitor start date: ~ 06/07/22 (patient's daughter to set up shipment of Afinitor with [employer]8/4/23)  Adverse effects include but are not limited to: mouth sores, GI upset, nausea, diarrhea, rash, increased blood sugars, decreased blood counts, increased blood pressure, pulmonary toxicity, and edema.  Dexamethasone mouthwash for the prevention of stomatitis will be [facility] message sent to MD. We discussed appropriate use of mouthwash - patient will take 10 mLs (1 mg total) by mouth in the morning, at noon, in the evening, and at bedtime. Swish for 2 minutes in mouth and then spit. Do not eat or drink for 1 hour after mouth rinse  Discussed typical duration of dexamethasone mouthwash prevention for stomatitis prevention is ~8 weeks  Reviewed importance of keeping a medication schedule and plan for any missed doses. No barriers to medication adherence identified.  Medication reconciliation performed and medication/allergy list updated.  Insurance authorization for Afinitor has been obtained. Due to high cost, patient has been approved to receive Afinitor through [employer] no cost.   All  questions answered.  Patient's daughter voiced understanding and appreciation.   Medication education handout placed in mail for patient. Patient and patient's daughter know to call the office with questions or concerns. Oral Chemotherapy Clinic phone number provided.   RLeron Croak PharmD, BCPS, BPontiac General HospitalHematology/Oncology Clinical Pharmacist [place]vina [place]leand HDe[phone]/01/2022 10:39 AM

## 2022-06-05 ENCOUNTER — Other Ambulatory Visit: Payer: Self-pay

## 2022-06-05 NOTE — Progress Notes (Signed)
Sent email to [employer] representative [name] regarding [employer] for $9K.  Pt's daughter called again today 06/05/2022 regarding the invoice the pt received.  The patient is upset and worried about how she's going to pay the invoice.  Cc'd [name] and Alferd Apa to email sent to [facility].  Awaiting [name]'s response.

## 2022-06-06 ENCOUNTER — Emergency Department (HOSPITAL_BASED_OUTPATIENT_CLINIC_OR_DEPARTMENT_OTHER)
Admission: EM | Admit: 2022-06-06 | Discharge: 2022-06-06 | Disposition: A | Discharge status: Home / Self Care | Payer: PPO | Attending: Emergency Medicine | Admitting: Emergency Medicine

## 2022-06-06 ENCOUNTER — Other Ambulatory Visit: Payer: Self-pay

## 2022-06-06 ENCOUNTER — Emergency Department (HOSPITAL_BASED_OUTPATIENT_CLINIC_OR_DEPARTMENT_OTHER): Payer: PPO | Admitting: Radiology

## 2022-06-06 ENCOUNTER — Encounter (HOSPITAL_BASED_OUTPATIENT_CLINIC_OR_DEPARTMENT_OTHER): Payer: Self-pay

## 2022-06-06 ENCOUNTER — Emergency Department (HOSPITAL_BASED_OUTPATIENT_CLINIC_OR_DEPARTMENT_OTHER): Payer: PPO

## 2022-06-06 DIAGNOSIS — M25562 Pain in left knee: Secondary | ICD-10-CM | POA: Insufficient documentation

## 2022-06-06 DIAGNOSIS — R58 Hemorrhage, not elsewhere classified: Secondary | ICD-10-CM | POA: Diagnosis not present

## 2022-06-06 DIAGNOSIS — Z79899 Other long term (current) drug therapy: Secondary | ICD-10-CM | POA: Insufficient documentation

## 2022-06-06 DIAGNOSIS — W19XXXA Unspecified fall, initial encounter: Secondary | ICD-10-CM | POA: Diagnosis not present

## 2022-06-06 DIAGNOSIS — S8992XA Unspecified injury of left lower leg, initial encounter: Secondary | ICD-10-CM | POA: Diagnosis not present

## 2022-06-06 DIAGNOSIS — R609 Edema, unspecified: Secondary | ICD-10-CM | POA: Diagnosis not present

## 2022-06-06 MED ORDER — IBUPROFEN 400 MG PO TABS
600.0000 mg | ORAL_TABLET | Freq: Once | ORAL | Status: AC
  Administered 2022-06-06: 600 mg via ORAL
  Filled 2022-06-06: qty 1

## 2022-06-06 NOTE — Discharge Instructions (Signed)
You have significant arthritis in your left knee.  There were no broken bones in the knee thankfully.  However you may still have an effusion, swelling or pain of the leg and the ligaments and muscles around it from your fall.  I recommend that you consider wearing a knee brace or sleeve, as needed, for comfort and stability when you are walking for the next 2 weeks.  You can take this off when you are showering in bed.  Take your time when standing up, especially from better or the toilet.

## 2022-06-06 NOTE — ED Triage Notes (Signed)
[facility] reports pt coming from home. Pt got dizzy when bending over and fell landing on her left shoulder and left knee. No LOC.

## 2022-06-06 NOTE — ED Provider Notes (Signed)
[facility] DEPT Provider Note   CSN: [id number] Arrival date & time: 06/06/22  1030     History  Chief Complaint  Patient presents with   Lytle Michaels    Gloria [name] is a 88 y.o. female presenting from home by EMS with a fall today.  The patient reports that she had been bending over and got a little lightheaded or dizzy when she stood up, then fell over landing on her left knee.  She says she bumped her left shoulder on a cabinet, denies striking her head, denies LOC.  She is reporting pain only in her left knee.  She has been able to bear weight but having difficulty doing so.  She lives independently in her own townhouse.  She lives by herself.  She is here with her daughter however in the ER.  She is not on blood thinners.  She does have a history of severe osteo arthritis in her knees and gets cortisone injections into them.  HPI     Home Medications Prior to Admission medications   Medication Sig Start Date End Date Taking? Authorizing Provider  acetaminophen (TYLENOL) 500 MG tablet Take 1,000 mg by mouth daily as needed for moderate pain. Reported on 03/10/2016    [provider]  ALPRAZolam Duanne Moron) 0.25 MG tablet Take by mouth.    [provider]  benzonatate (TESSALON) 100 MG capsule Take 1 capsule (100 mg total) by mouth every 8 (eight) hours. 10/27/21   Orpah Greek, MD  CALCIUM-MAGNESIUM-VITAMIN D PO Take 1 tablet by mouth 2 (two) times daily.    [provider]  dexamethasone (DECADRON) 0.5 MG/5ML solution Take 10 mLs by mouth in the morning, at noon, in the evening, and at bedtime. Swish for 2 minutes in mouth and then spit. Do not eat or drink for 1 hour after mouth rinse. 06/02/22   Truitt Merle, MD  doxepin (SINEQUAN) 10 MG capsule Take 10 mg by mouth at bedtime.    [provider]  doxycycline (VIBRA-TABS) 100 MG tablet Take 1 tablet (100 mg total) by mouth 2 (two) times daily. Patient not taking: Reported on  06/06/2022 02/10/22   Trula Slade, DPM  everolimus (AFINITOR) 5 MG tablet Take 1 tablet (5 mg total) by mouth daily. 05/19/22   Truitt Merle, MD  exemestane (AROMASIN) 25 MG tablet Take 1 tablet by mouth daily after breakfast. 05/19/22   Truitt Merle, MD  famotidine (PEPCID) 20 MG tablet Take 20 mg by mouth daily.    [provider]  FLOVENT HFA 44 MCG/ACT inhaler Inhale into the lungs. 11/20/20   [provider]  fluticasone (FLONASE) 50 MCG/ACT nasal spray Place 1 spray into both nostrils daily as needed for allergies.     [provider]  guaiFENesin (MUCINEX) 600 MG 12 hr tablet Take 600 mg by mouth daily as needed for cough or to loosen phlegm. Reported on 03/28/2016    [provider]  hydrochlorothiazide (MICROZIDE) 12.5 MG capsule Take 1 capsule (12.5 mg total) by mouth daily. Appointment needed continuation of refills-1st attempt 01/11/22   Troy Sine, MD  losartan (COZAAR) 50 MG tablet Take 1 tablet by mouth once daily 04/26/22   Troy Sine, MD  mupirocin ointment (BACTROBAN) 2 % Apply 1 application. topically 2 (two) times daily. 02/10/22   Trula Slade, DPM  nitroGLYCERIN (NITROSTAT) 0.4 MG SL tablet Place 1 tablet (0.4 mg total) under the tongue every 5 (five) minutes as needed  for chest pain. <PLEASE MAKE APPOINTMENT> 02/25/16   Troy Sine, MD  ondansetron (ZOFRAN) 4 MG tablet Take 1 tablet (4 mg total) by mouth every 8 (eight) hours as needed for nausea or vomiting. OK TO TAKE 2 TABS as needed if 1 tab does not work 03/16/22   Truitt Merle, MD  pseudoephedrine-acetaminophen (TYLENOL SINUS) 30-500 MG TABS tablet Take 1 tablet by mouth every 4 (four) hours as needed.    [provider]      Allergies    Contrast media [iodinated contrast media], Penicillins, Adhesive [tape], Cephalexin, Ciprofloxacin, Demerol, Meperidine hcl, Sulfa antibiotics, Sulfamethoxazole-trimethoprim, and Quinolones    Review of Systems   Review of  Systems  Physical Exam Updated Vital Signs BP (!) 151/75 (BP Location: Right Arm)   Pulse 64   Temp 97.7 F (36.5 C) (Oral)   Resp 18   Ht '5\' 1"'$  (1.549 m)   Wt 58.1 kg   SpO2 99%   BMI 24.19 kg/m  Physical Exam Constitutional:      General: She is not in acute distress. HENT:     Head: Normocephalic and atraumatic.  Eyes:     Conjunctiva/sclera: Conjunctivae normal.     Pupils: Pupils are equal, round, and reactive to light.  Cardiovascular:     Rate and Rhythm: Normal rate and regular rhythm.  Pulmonary:     Effort: Pulmonary effort is normal. No respiratory distress.  Musculoskeletal:     Comments: Full range of motion of the left upper extremities with no visible deformities or tenderness of the shoulder joint. No cervical spinal midline tenderness Patient is able to actively and passively flex her left knee to 90 degrees, but does have isolated tenderness of the patellar head, and a small effusion around the left knee.  Skin:    General: Skin is warm and dry.  Neurological:     General: No focal deficit present.     Mental Status: She is alert. Mental status is at baseline.  Psychiatric:        Mood and Affect: Mood normal.        Behavior: Behavior normal.     ED Results / Procedures / Treatments   Labs (all labs ordered are listed, but only abnormal results are displayed) Labs Reviewed - No data to display  EKG EKG Interpretation  Date/Time:  Tuesday June 06 2022 10:39:09 EDT Ventricular Rate:  61 PR Interval:  142 QRS Duration: 104 QT Interval:  431 QTC Calculation: 435 R Axis:   1 Text Interpretation: Sinus rhythm Confirmed by Octaviano Glow [phone]) on 06/06/2022 11:05:20 AM  Radiology CT Knee Left Wo Contrast  Result Date: 06/06/2022 CLINICAL DATA:  Knee trauma.  Possible occult fracture. EXAM: CT OF THE LEFT KNEE WITHOUT CONTRAST TECHNIQUE: Multidetector CT imaging of the left knee was performed according to the standard protocol. Multiplanar CT  image reconstructions were also generated. RADIATION DOSE REDUCTION: This exam was performed according to the departmental dose-optimization program which includes automated exposure control, adjustment of the mA and/or kV according to patient size and/or use of iterative reconstruction technique. COMPARISON:  Radiographs, same date. FINDINGS: Moderate tricompartmental degenerative changes with joint space narrowing, osteophytic spurring and chondrocalcinosis. No acute fracture. No joint effusion. Grossly by CT the major ligaments and tendons are intact. IMPRESSION: 1. Moderate tricompartmental degenerative changes with chondrocalcinosis. 2. No acute fracture or joint effusion. Electronically Signed   By: Marijo Sanes M.D.   On: 06/06/2022 12:43   DG Knee Complete  4 Views Left  Result Date: 06/06/2022 CLINICAL DATA:  Anterior knee pain after fall EXAM: LEFT KNEE - COMPLETE 4+ VIEW COMPARISON:  None Available. FINDINGS: Diffuse osseous demineralization. Possible nondisplaced fracture through the inferior pole of the patella. Trace knee joint effusion. No radiographic evidence of a lipohemarthrosis. Moderate-severe tricompartmental osteoarthritis, most pronounced within the lateral compartment. Chondrocalcinosis. Soft tissues within normal limits. IMPRESSION: 1. Possible nondisplaced fracture through the inferior pole of the patella. Correlate with point tenderness and consider CT for further assessment. 2. Moderate-severe tricompartmental osteoarthritis of the left knee. 3. Trace knee joint effusion. Electronically Signed   By: Davina Poke D.O.   On: 06/06/2022 11:21    Procedures Procedures    Medications Ordered in ED Medications  ibuprofen (ADVIL) tablet 600 mg (600 mg Oral Given 06/06/22 1106)    ED Course/ Medical Decision Making/ A&P Clinical Course as of 06/06/22 1427  Tue Jun 06, 2022  1346 No acute traumatic fracture noted on CT.  Patient can be weightbearing as tolerated.  Will attempt  to ambulate her here with a walker. [MT]  1346 Knee immobilizer provided for stability, [MT]    Clinical Course User Index [MT] Audris Speaker, Carola Rhine, MD                           Medical Decision Making Amount and/or Complexity of Data Reviewed Radiology: ordered.   Patient is presenting today with a fall which I suspect may have been related to orthostasis she was standing up and got lightheaded, landing on her left knee.  She has an isolated injury of the left knee.  X-rays of the knee were ordered reviewed showing questionable patellar fracture.  Subsequent CT scan ordered and reviewed showing no fracture.  Otherwise her trauma workup is unremarkable.  She denies any significant evidence of shoulder injury, head injury, spinal injury.  I do not believe need further radiographic imaging at this time.  Supplemental history provided by the patient's daughter at bedside  Patient took Tylenol at home for pain.  A dose of Motrin was given here as well.  EKG was obtained by triage staff on the patient's arrival per my interpretation review shows a normal sinus rhythm no acute ischemic findings and no significant arrhythmia.  I have a very low suspicion for arrhythmia, PE, anemia, ACS as a cause of her symptoms or fall today.        Final Clinical Impression(s) / ED Diagnoses Final diagnoses:  Acute pain of left knee    Rx / DC Orders ED Discharge Orders     None         Wyvonnia Dusky, MD 06/06/22 1428

## 2022-06-08 ENCOUNTER — Telehealth: Payer: Self-pay

## 2022-06-08 NOTE — Telephone Encounter (Signed)
LVM on pt's daughter's phone stating the $9K bill the pt received from [place] is a EOB of what was charged to the pt's insurance per [place].  If the pt has out-of-pocket cost, the max would be $25.  Instructed pt's daughter to please contact [name] office should they have additional questions or concerns.

## 2022-06-12 ENCOUNTER — Telehealth: Payer: Self-pay

## 2022-06-12 NOTE — Telephone Encounter (Signed)
Pt's daughter called stating that the pt fell on Tuesday 06/06/2022 and hurt her knee cap.  Pt's daughter stated that they went to the doctor and the pt's knee was placed in an immobilizer/brace and the orthopedic provider recommend not putting much weight on the knee.  Pt's daughter was concerned because pt is scheduled for a PET Scan on Wednesday 06/14/2022 and will need help getting on the table for her PET.  Informed pt's daughter to bring pt for her PET Scan on Wednesday and use a wheelchair and that staff in [address] will assist pt with getting on the table.  Also, spoke with [facility] in [place] to update them on the pt's recent fall and assistance needed with getting on table.

## 2022-06-14 ENCOUNTER — Encounter (HOSPITAL_COMMUNITY)
Admission: RE | Admit: 2022-06-14 | Discharge: 2022-06-14 | Disposition: A | Discharge status: Home / Self Care | Payer: PPO | Source: Ambulatory Visit | Attending: Hematology | Admitting: Hematology

## 2022-06-14 ENCOUNTER — Other Ambulatory Visit: Payer: Self-pay

## 2022-06-14 DIAGNOSIS — Z170 Estrogen receptor positive status [ER+]: Secondary | ICD-10-CM | POA: Insufficient documentation

## 2022-06-14 DIAGNOSIS — C50112 Malignant neoplasm of central portion of left female breast: Secondary | ICD-10-CM | POA: Diagnosis not present

## 2022-06-14 DIAGNOSIS — C50919 Malignant neoplasm of unspecified site of unspecified female breast: Secondary | ICD-10-CM | POA: Diagnosis not present

## 2022-06-14 LAB — GLUCOSE, CAPILLARY: Glucose-Capillary: 103 mg/dL — ABNORMAL HIGH (ref 70–99)

## 2022-06-14 MED ORDER — FLUDEOXYGLUCOSE F - 18 (FDG) INJECTION
6.3200 | Freq: Once | INTRAVENOUS | Status: AC | PRN
  Administered 2022-06-14: 6.32 via INTRAVENOUS

## 2022-06-14 MED ORDER — EVEROLIMUS 5 MG PO TABS: 5.0000 mg | ORAL_TABLET | Freq: Every day | ORAL | 0 refills | Status: DC

## 2022-06-19 ENCOUNTER — Other Ambulatory Visit (HOSPITAL_COMMUNITY): Payer: Self-pay

## 2022-06-22 ENCOUNTER — Inpatient Hospital Stay: Payer: PPO | Attending: Hematology | Admitting: Hematology

## 2022-06-22 ENCOUNTER — Other Ambulatory Visit: Payer: Self-pay

## 2022-06-22 ENCOUNTER — Inpatient Hospital Stay: Payer: PPO

## 2022-06-22 ENCOUNTER — Encounter: Payer: Self-pay | Admitting: Hematology

## 2022-06-22 VITALS — BP 120/74 | HR 70 | Temp 97.8°F | Resp 18 | Ht 61.0 in | Wt 130.4 lb

## 2022-06-22 DIAGNOSIS — K5730 Diverticulosis of large intestine without perforation or abscess without bleeding: Secondary | ICD-10-CM | POA: Insufficient documentation

## 2022-06-22 DIAGNOSIS — K219 Gastro-esophageal reflux disease without esophagitis: Secondary | ICD-10-CM | POA: Insufficient documentation

## 2022-06-22 DIAGNOSIS — Z7952 Long term (current) use of systemic steroids: Secondary | ICD-10-CM | POA: Diagnosis not present

## 2022-06-22 DIAGNOSIS — Z79899 Other long term (current) drug therapy: Secondary | ICD-10-CM | POA: Diagnosis not present

## 2022-06-22 DIAGNOSIS — I700 Atherosclerosis of aorta: Secondary | ICD-10-CM | POA: Insufficient documentation

## 2022-06-22 DIAGNOSIS — Z79811 Long term (current) use of aromatase inhibitors: Secondary | ICD-10-CM | POA: Insufficient documentation

## 2022-06-22 DIAGNOSIS — Z803 Family history of malignant neoplasm of breast: Secondary | ICD-10-CM | POA: Diagnosis not present

## 2022-06-22 DIAGNOSIS — Z170 Estrogen receptor positive status [ER+]: Secondary | ICD-10-CM | POA: Diagnosis not present

## 2022-06-22 DIAGNOSIS — Z800 Family history of malignant neoplasm of digestive organs: Secondary | ICD-10-CM | POA: Diagnosis not present

## 2022-06-22 DIAGNOSIS — Z8616 Personal history of COVID-19: Secondary | ICD-10-CM | POA: Diagnosis not present

## 2022-06-22 DIAGNOSIS — I10 Essential (primary) hypertension: Secondary | ICD-10-CM | POA: Insufficient documentation

## 2022-06-22 DIAGNOSIS — Z9012 Acquired absence of left breast and nipple: Secondary | ICD-10-CM | POA: Diagnosis not present

## 2022-06-22 DIAGNOSIS — Z923 Personal history of irradiation: Secondary | ICD-10-CM | POA: Diagnosis not present

## 2022-06-22 DIAGNOSIS — K760 Fatty (change of) liver, not elsewhere classified: Secondary | ICD-10-CM | POA: Diagnosis not present

## 2022-06-22 DIAGNOSIS — C50112 Malignant neoplasm of central portion of left female breast: Secondary | ICD-10-CM | POA: Diagnosis not present

## 2022-06-22 DIAGNOSIS — I2510 Atherosclerotic heart disease of native coronary artery without angina pectoris: Secondary | ICD-10-CM | POA: Diagnosis not present

## 2022-06-22 LAB — CMP (CANCER CENTER ONLY)
ALT: 11 U/L (ref 0–44)
AST: 21 U/L (ref 15–41)
Albumin: 4.1 g/dL (ref 3.5–5.0)
Alkaline Phosphatase: 89 U/L (ref 38–126)
Anion gap: 5 (ref 5–15)
BUN: 28 mg/dL — ABNORMAL HIGH (ref 8–23)
CO2: 29 mmol/L (ref 22–32)
Calcium: 9.5 mg/dL (ref 8.9–10.3)
Chloride: 104 mmol/L (ref 98–111)
Creatinine: 1.15 mg/dL — ABNORMAL HIGH (ref 0.44–1.00)
GFR, Estimated: 45 mL/min — ABNORMAL LOW (ref >60)
Glucose, Bld: 84 mg/dL (ref 70–99)
Potassium: 4.3 mmol/L (ref 3.5–5.1)
Sodium: 138 mmol/L (ref 135–145)
Total Bilirubin: 0.3 mg/dL (ref 0.3–1.2)
Total Protein: 6.4 g/dL — ABNORMAL LOW (ref 6.5–8.1)

## 2022-06-22 LAB — CBC WITH DIFFERENTIAL (CANCER CENTER ONLY)
Abs Immature Granulocytes: 0.02 10*3/uL (ref 0.00–0.07)
Basophils Absolute: 0 10*3/uL (ref 0.0–0.1)
Basophils Relative: 1 %
Eosinophils Absolute: 0.4 10*3/uL (ref 0.0–0.5)
Eosinophils Relative: 7 %
HCT: 34.6 % — ABNORMAL LOW (ref 36.0–46.0)
Hemoglobin: 11.6 g/dL — ABNORMAL LOW (ref 12.0–15.0)
Immature Granulocytes: 0 %
Lymphocytes Relative: 13 %
Lymphs Abs: 0.6 10*3/uL — ABNORMAL LOW (ref 0.7–4.0)
MCH: 31.8 pg (ref 26.0–34.0)
MCHC: 33.5 g/dL (ref 30.0–36.0)
MCV: 94.8 fL (ref 80.0–100.0)
Monocytes Absolute: 0.5 10*3/uL (ref 0.1–1.0)
Monocytes Relative: 11 %
Neutro Abs: 3.4 10*3/uL (ref 1.7–7.7)
Neutrophils Relative %: 68 %
Platelet Count: 152 10*3/uL (ref 150–400)
RBC: 3.65 MIL/uL — ABNORMAL LOW (ref 3.87–5.11)
RDW: 13.5 % (ref 11.5–15.5)
WBC Count: 5 10*3/uL (ref 4.0–10.5)
nRBC: 0 % (ref 0.0–0.2)

## 2022-06-22 NOTE — Progress Notes (Signed)
[place]   Telephone:([phone]) [phone] Fax:(336) [phone]   Clinic Follow up Note   Patient Care Team: Lajean Manes, MD as PCP - [place], [place], MD as Consulting Physician (General Surgery) Truitt Merle, MD as Consulting Physician (Hematology) [name], MD as Consulting Physician (Radiation Oncology) Sylvan Cheese, NP as Nurse Practitioner (Hematology and Oncology) Karen Kays, NP as Nurse Practitioner (Nurse Practitioner)  Date of Service:  06/22/2022  CHIEF COMPLAINT: f/u of left breast cancer  CURRENT THERAPY:  To start exemestane 06/22/22  ASSESSMENT & PLAN:  Gloria [name] is a 88 y.o. post-hysterectomy female with   1. Cancer of the central portion of left female breast, Stage IIB, T2 N1 invasive lobular carcinoma, grade 2, ER+/PR+, HER-2-, local chest wall recurrence in 09/2015, node mets in 11/2021 -initially diagnosed in 07/2010, treated with mastectomy and adjuvant tamoxifen for 4.5 years.  -She developed local chest wall recurrence in 09/2015 and completed chest wall radiation. -she had another recurrence in 10/2015, s/p resection in 06/2016, surgical margins were positive. Dr. Marlou Starks did not offer more surgery because complete surgical resection was unlikely. Pt agreed.  -She started monthly fulvestrant injection on 06/07/17. Due to lost follow up and miscommunications, she was off treatment 09/26/19 - 04/29/20. She tolerated well aside from reaction to the injection site. -she started Ibrance 75 mg 01/09/22 due to worsening adenopathy, stopped after two weeks due to fatigue and diarrhea. -Guardant 360 03/16/22 showed no ESR 1 mutation or other targetable mutations .  -fulvestrant discontinued after 04/13/22 due to intolerance to injection. I called in exemestane and Afinitor on 05/19/22, but she tells me today (06/22/22) she did not start either. -restaging PET scan 06/14/22 showed overall stable disease. I reviewed the results with them  today. -we again discussed whether to try treatment or move to supportive care alone. After discussed, she would like to try medicine. I advised her to try exemestane first, then add Afinitor if she tolerates well. -labs reviewed, overall stable, hgb 11.6.    2.HTN, CAD, OSTEOPROSIS  -F/U with PCP   Plan  -start exemestane, and add Afinitor in 1-2 weeks if she tolerates well -phone visit in 4 weeks   No problem-specific Assessment & Plan notes found for this encounter.   SUMMARY OF ONCOLOGIC HISTORY: Oncology History Overview Note  Cancer of central portion of left female breast [facility])   Staging form: Breast, AJCC 7th Edition     Pathologic stage from 08/04/2010: Stage IIB (T2, N1a, cM0) - Signed by Truitt Merle, MD on 12/09/2015      Cancer of central portion of left female breast ([place])  08/03/2010 Mammogram    left nipple retraction, and the palpable mass subareolar left breast at the 6:00 position. Ultrasound confirmed the presence of a mass measuring 2.6 x 1.7 x 2.4 cm   08/04/2010 Initial Biopsy   left breast mass biopsy showed an invasive mammary carcinoma with lobular features. ER 95%, PR 97%, Ki-67 17%, HER-2/neu (-)   08/04/2010 Pathologic Stage   Stage IIB: T2 N1a   08/11/2010 Imaging   left breast mass 4.6 x 4.2 x 2.3 cm with enhancement distortion extending to the nipple retraction. There is questionable anterior mediastinal lymph node seen   07/2010 - 09/03/2010 Anti-estrogen oral therapy   neoadjuvant letrozole    09/03/2010 Surgery    left breast mastectomy with sentinel node biopsy on 09/03/2010.   08/2010 - 01/2015 Anti-estrogen oral therapy   Tamoxifen, pt stopped on her  own    12/03/2010 - 01/23/2011 Radiation Therapy   left chest wall and axilla radiaiton    10/19/2015 Progression   Left chest wall recurrence, s/p resection on 11/25/2015 with positive margins ([place])    12/10/2015 - 05/31/2017 Anti-estrogen oral therapy   anastrozole 16m daily, which was switched to  Tamoxifen on 06/30/2016 due to persistent disease.   01/03/2016 - 03/03/2016 Radiation Therapy   Radiation to left chest wall recurrence (12/06/2015-02/04/2016 - 44 Gy), pt progressed through the radiation, and had additional 10 fractions of radiation (02/21/16-03/03/2016 - total 70 Gy to the left chest wall)   07/14/2016 PET scan   PET 07/14/2016 IMPRESSION: 1. No findings of recurrent malignancy. 2. Radiation fibrosis in the lingula. A small left axillary lymph node is not hypermetabolic and only 6 mm in diameter. This is in the vicinity of the prior axillary cyst. 3. Pelvic floor laxity.   07/17/2016 Surgery   Chest wall soft tissue resection for local recurrence   07/17/2016 Pathology Results   Left chest wall two soft tissue resection both showed invasive lobular carcinoma, margins were positive. ER 95% positive, PR 60% positive, HER-2 negative.   07/27/2016 Imaging   CT chest, abdomen and pelvis wo contrast  07/27/2016 IMPRESSION: Sigmoid diverticulosis without acute diverticulitis. No bowel obstruction or acute inflammation. Degenerative disc disease and facet arthropathy of the lower lumbar spine with slight grade 1 anterolisthesis of L4 on L5. Stable appearing simple left-sided renal cysts.   03/01/2017 Mammogram   Mammogram 03/01/2017 IMPRESSION: No mammographic evidence of malignancy. A result letter of this screening mammogram will be mailed directly to the patient.  RECOMMENDATION: Screening mammogram in one year   04/10/2017 Imaging   PET IMPRESSION: 1. Stable exam.  No findings of recurrent malignancy. 2. Changes of external beam radiation again noted within the lingula. 3. Small left axillary lymph node exhibits mild increased uptake. Unchanged from previous exam.   06/07/2017 -  Anti-estrogen oral therapy   Stopped Tamoxifen and Began monthly Faslodex IM injection on 06/07/17  -Held after 09/26/19 due to lost f/u. Restart with loading dose on 04/29/20.    09/19/2017 Imaging   CT  CAP WO Contrast 09/19/17 IMPRESSION: 1. Surgical changes from a left mastectomy without CT findings to suggest recurrent chest wall tumor or axillary lymphadenopathy. 2. No findings to suggest metastatic disease involving the neck, chest, abdomen or pelvis or osseous structures. 3. Stable radiation changes involving the lingula.    09/19/2017 Imaging   Bone Scan Whole Body  IMPRESSION: No definite scintigraphic evidence of osseous metastatic disease.  Scattered degenerative type uptake as above.   03/11/2018 PET scan   IMPRESSION:  1. No evidence of recurrent disease. 2.  Aortic atherosclerosis    09/03/2018 PET scan   09/03/2018 PET Scan IMPRESSION: 1. No findings to suggest locally recurrent disease or metastatic disease in the neck, chest, abdomen or pelvis. 2. Hepatic steatosis. 3. Colonic diverticulosis without evidence of acute diverticulitis at this time. 4. Aortic atherosclerosis. 5. Additional incidental findings, as above.   11/25/2018 Imaging   DG Ribs Unilateral W/Chest Left  IMPRESSION: Stable left lingular scarring. Normal left ribs. No acute cardiopulmonary abnormality seen.    04/04/2019 PET scan   PET  IMPRESSION: 1. No evidence of hypermetabolic recurrent or metastatic disease. 2. Hepatic steatosis. 3.  Aortic Atherosclerosis (ICD10-I70.0).   05/18/2020 PET scan   IMPRESSION: 1. Findings of LEFT mastectomy and axillary dissection with radiation changes, no signs of metastatic disease or disease recurrence at  this time. 2. Hepatic steatosis. 3. Aortic atherosclerosis.   Aortic Atherosclerosis (ICD10-I70.0).     11/09/2020 PET scan   IMPRESSION: 1. No findings of hypermetabolic metastatic disease in the neck, chest, abdomen or pelvis. 2. Hepatic steatosis. 3. Aortic atherosclerosis. 4. Increased metabolism about the sphincter complex and lower rectum more likely physiologic; however, consider digital rectal exam and correlation with any symptoms in  this location.   05/03/2021 PET scan   IMPRESSION: Status post left mastectomy with left axillary lymph node dissection. Radiation changes in the left upper lobe/lingula.   No evidence of recurrent or metastatic disease.   12/19/2021 PET scan   IMPRESSION: 1. New or increasing abdominal retroperitoneal adenopathy and soft tissue stranding/thickening along the left periaortic station, with mild hypermetabolism. Findings are worrisome for malignancy, possibly unrelated to breast cancer. 2. Left pelvicaliceal dilatation/obstruction with increasing left perinephric stranding. 3. Consider CT or MR abdomen pelvis without and with contrast in further evaluation, as clinically indicated. 4. Aortic atherosclerosis (ICD10-I70.0). Coronary artery calcification.      INTERVAL HISTORY:  ELSIA LASOTA is here for a follow up of breast cancer. She was last seen by me on 05/19/22. She presents to the clinic accompanied by her daughter. They reports she had a fall since her last visit.   All other systems were reviewed with the patient and are negative.  MEDICAL HISTORY:  Past Medical History:  Diagnosis Date   Anxiety    Blood in urine    CAD (coronary artery disease)    non obstructive by cath   Cancer [facility])    breast- left   COVID    Depression    Family history of breast cancer    Family history of colon cancer    Family history of ovarian cancer    Family history of pancreatic cancer    Family history of prostate cancer    GERD (gastroesophageal reflux disease)    Hypertension    Knee fracture    Personal history of radiation therapy 2017   TR (tricuspid regurgitation)    mild by Echo 12/2008 EF >55%    SURGICAL HISTORY: Past Surgical History:  Procedure Laterality Date   ABDOMINAL AORTAGRAM N/A 09/08/2011   Procedure: ABDOMINAL Maxcine Ham;  Surgeon: Troy Sine, MD;  Location[facility] CATH LAB;  Service: Cardiovascular;  Laterality: N/A;   ABDOMINAL HYSTERECTOMY     BREAST  EXCISIONAL BIOPSY Left 2017   X3   BREAST SURGERY  08-30-10   mastectomy   CARDIAC CATHETERIZATION  08/2011   20% LAD stenosis, 20% diagonal stenosis, 10-20% proximal dominant RCA stenosis   COLON SURGERY     LEFT HEART CATHETERIZATION WITH CORONARY ANGIOGRAM N/A 09/08/2011   Procedure: LEFT HEART CATHETERIZATION WITH CORONARY ANGIOGRAM;  Surgeon: Troy Sine, MD;  Location: [facility] CATH LAB;  Service: Cardiovascular;  Laterality: N/A;   MASTECTOMY     MINOR BREAST BIOPSY Left 11/25/2015   Procedure: MINOR EXCISION MASS LEFT CHEST WALL;  Surgeon: Autumn Messing III, MD;  Location: [place];  Service: General;  Laterality: Left;   MINOR BREAST BIOPSY Left 07/17/2016   Procedure: EXCISION OF 2 LEFT CHEST WALL MASSES;  Surgeon: Autumn Messing III, MD;  Location: [place];  Service: General;  Laterality: Left;   TONSILLECTOMY      I have reviewed the social history and family history with the patient and they are unchanged from previous note.  ALLERGIES:  is allergic to contrast media [iodinated contrast  media], penicillins, adhesive [tape], cephalexin, ciprofloxacin, demerol, meperidine hcl, sulfa antibiotics, sulfamethoxazole-trimethoprim, and quinolones.  MEDICATIONS:  Current Outpatient Medications  Medication Sig Dispense Refill   acetaminophen (TYLENOL) 500 MG tablet Take 1,000 mg by mouth daily as needed for moderate pain. Reported on 03/10/2016     ALPRAZolam (XANAX) 0.25 MG tablet Take by mouth.     benzonatate (TESSALON) 100 MG capsule Take 1 capsule (100 mg total) by mouth every 8 (eight) hours. 21 capsule 0   CALCIUM-MAGNESIUM-VITAMIN D PO Take 1 tablet by mouth 2 (two) times daily.     dexamethasone (DECADRON) 0.5 MG/5ML solution Take 10 mLs by mouth in the morning, at noon, in the evening, and at bedtime. Swish for 2 minutes in mouth and then spit. Do not eat or drink for 1 hour after mouth rinse. 500 mL 4   doxepin (SINEQUAN) 10 MG capsule Take 10 mg by mouth  at bedtime.     doxycycline (VIBRA-TABS) 100 MG tablet Take 1 tablet (100 mg total) by mouth 2 (two) times daily. (Patient not taking: Reported on 06/06/2022) 14 tablet 0   everolimus (AFINITOR) 5 MG tablet Take 1 tablet (5 mg total) by mouth daily. 30 tablet 0   exemestane (AROMASIN) 25 MG tablet Take 1 tablet by mouth daily after breakfast. 30 tablet 2   famotidine (PEPCID) 20 MG tablet Take 20 mg by mouth daily.     FLOVENT HFA 44 MCG/ACT inhaler Inhale into the lungs.     fluticasone (FLONASE) 50 MCG/ACT nasal spray Place 1 spray into both nostrils daily as needed for allergies.      guaiFENesin (MUCINEX) 600 MG 12 hr tablet Take 600 mg by mouth daily as needed for cough or to loosen phlegm. Reported on 03/28/2016     hydrochlorothiazide (MICROZIDE) 12.5 MG capsule Take 1 capsule (12.5 mg total) by mouth daily. Appointment needed continuation of refills-1st attempt 30 capsule 0   losartan (COZAAR) 50 MG tablet Take 1 tablet by mouth once daily 90 tablet 0   mupirocin ointment (BACTROBAN) 2 % Apply 1 application. topically 2 (two) times daily. 30 g 2   nitroGLYCERIN (NITROSTAT) 0.4 MG SL tablet Place 1 tablet (0.4 mg total) under the tongue every 5 (five) minutes as needed for chest pain. <PLEASE MAKE APPOINTMENT> 25 tablet 3   ondansetron (ZOFRAN) 4 MG tablet Take 1 tablet (4 mg total) by mouth every 8 (eight) hours as needed for nausea or vomiting. OK TO TAKE 2 TABS as needed if 1 tab does not work 30 tablet 1   pseudoephedrine-acetaminophen (TYLENOL SINUS) 30-500 MG TABS tablet Take 1 tablet by mouth every 4 (four) hours as needed.     No current facility-administered medications for this visit.    PHYSICAL EXAMINATION: ECOG PERFORMANCE STATUS: 2 - Symptomatic, <50% confined to bed  Vitals:   06/22/22 1158  BP: 120/74  Pulse: 70  Resp: 18  Temp: 97.8 F (36.6 C)  SpO2: 98%   Wt Readings from Last 3 Encounters:  06/22/22 130 lb 6.4 oz (59.1 kg)  06/06/22 128 lb (58.1 kg)  05/19/22  128 lb 4.8 oz (58.2 kg)     GENERAL:alert, no distress and comfortable SKIN: skin color normal, no rashes or significant lesions EYES: normal, Conjunctiva are pink and non-injected, sclera clear  NEURO: alert & oriented x 3 with fluent speech  LABORATORY DATA:  I have reviewed the data as listed    Latest Ref Rng & Units 06/22/2022   11:43 AM 05/19/2022  12:52 PM 04/13/2022   10:55 AM  CBC  WBC 4.0 - 10.5 K/uL 5.0  5.1  5.0   Hemoglobin 12.0 - 15.0 g/dL 11.6  11.9  12.2   Hematocrit 36.0 - 46.0 % 34.6  35.8  36.2   Platelets 150 - 400 K/uL 152  159  138         Latest Ref Rng & Units 06/22/2022   11:43 AM 05/19/2022   12:52 PM 04/13/2022   10:55 AM  CMP  Glucose 70 - 99 mg/dL 84  79  92   BUN 8 - 23 mg/dL 28  23  26    Creatinine 0.44 - 1.00 mg/dL 1.15  1.16  1.12   Sodium 135 - 145 mmol/L 138  138  142   Potassium 3.5 - 5.1 mmol/L 4.3  4.0  3.6   Chloride 98 - 111 mmol/L 104  105  108   CO2 22 - 32 mmol/L 29  28  27    Calcium 8.9 - 10.3 mg/dL 9.5  9.2  9.6   Total Protein 6.5 - 8.1 g/dL 6.4  6.3  6.3   Total Bilirubin 0.3 - 1.2 mg/dL 0.3  0.6  0.5   Alkaline Phos 38 - 126 U/L 89  68  85   AST 15 - 41 U/L 21  23  22    ALT 0 - 44 U/L 11  12  17        RADIOGRAPHIC STUDIES: I have personally reviewed the radiological images as listed and agreed with the findings in the report. No results found.    No orders of the defined types were placed in this encounter.  All questions were answered. The patient knows to call the clinic with any problems, questions or concerns. No barriers to learning was detected. The total time spent in the appointment was 30 minutes.     Truitt Merle, MD 06/22/2022   I, Wilburn Mylar, am acting as scribe for Truitt Merle, MD.   I have reviewed the above documentation for accuracy and completeness, and I agree with the above.

## 2022-06-27 ENCOUNTER — Telehealth: Payer: Self-pay | Admitting: Hematology

## 2022-06-27 NOTE — Telephone Encounter (Signed)
Scheduled follow-up appointment per 8/24 los. Patient's daughter is aware. 

## 2022-07-12 ENCOUNTER — Telehealth: Payer: Self-pay

## 2022-07-12 NOTE — Patient Outreach (Signed)
Please Disregard 

## 2022-07-18 ENCOUNTER — Telehealth: Payer: Self-pay

## 2022-07-18 NOTE — Telephone Encounter (Signed)
Pt's daughter [name] called stating that the pt has a phone visit scheduled with [name] this week 07/20/2022 '@1220'$ .  [name] stated she will not be home but the pt's granddaughter "Stanton Kidney" will be there with the patient.  [name] is requesting if [name] could call Stanton Kidney at [phone] they day of the telephone appt.  Notified [name] of the request.

## 2022-07-19 ENCOUNTER — Other Ambulatory Visit: Payer: Self-pay | Admitting: Adult Health

## 2022-07-19 DIAGNOSIS — L821 Other seborrheic keratosis: Secondary | ICD-10-CM | POA: Diagnosis not present

## 2022-07-19 DIAGNOSIS — L570 Actinic keratosis: Secondary | ICD-10-CM | POA: Diagnosis not present

## 2022-07-19 DIAGNOSIS — Z85828 Personal history of other malignant neoplasm of skin: Secondary | ICD-10-CM | POA: Diagnosis not present

## 2022-07-19 DIAGNOSIS — L720 Epidermal cyst: Secondary | ICD-10-CM | POA: Diagnosis not present

## 2022-07-20 ENCOUNTER — Inpatient Hospital Stay: Payer: PPO | Attending: Hematology | Admitting: Hematology

## 2022-07-20 ENCOUNTER — Encounter: Payer: Self-pay | Admitting: Hematology

## 2022-07-20 DIAGNOSIS — Z170 Estrogen receptor positive status [ER+]: Secondary | ICD-10-CM

## 2022-07-20 DIAGNOSIS — C50112 Malignant neoplasm of central portion of left female breast: Secondary | ICD-10-CM

## 2022-07-20 NOTE — Progress Notes (Signed)
[place]   [phone] [phone]   Clinic Follow up Note   Patient Care Team: Charlane Ferretti, MD as PCP - General (Internal Medicine) Jovita Kussmaul, MD as Consulting Physician (General Surgery) Truitt Merle, MD as Consulting Physician (Hematology) [name], MD as Consulting Physician (Radiation Oncology) Sylvan Cheese, NP as Nurse Practitioner (Hematology and Oncology) Karen Kays, NP as Nurse Practitioner (Nurse Practitioner)  Date of Service:  07/20/2022  I connected with Gloria [name] on 07/20/2022 at 12:20 PM EDT by telephone visit and verified that I am speaking with the correct person using two identifiers.  I discussed the limitations, risks, security and privacy concerns of performing an evaluation and management service by telephone and the availability of in person appointments. I also discussed with the patient that there may be a patient responsible charge related to this service. The patient expressed understanding and agreed to proceed.   Other persons participating in the visit and their role in the encounter:  pt's granddaughter Stanton Kidney  Patient's location:  home Provider's location:  my office  CHIEF COMPLAINT: f/u of left breast cancer  CURRENT THERAPY:  Surveillance  ASSESSMENT & PLAN:  Gloria [name] is a 88 y.o. female with   1. Cancer of the central portion of left female breast, Stage IIB, T2 N1 invasive lobular carcinoma, grade 2, ER+/PR+, HER-2-, local chest wall recurrence in 09/2015, node mets in 11/2021 -initially diagnosed in 07/2010, treated with mastectomy and adjuvant tamoxifen for 4.5 years.  -She developed local chest wall recurrence in 09/2015 and completed chest wall radiation. -she had another recurrence in 10/2015, s/p resection in 06/2016, surgical margins were positive. Dr. Marlou Starks did not offer more surgery because complete surgical resection was unlikely. Pt agreed.  -She started monthly  fulvestrant injection on 06/07/17. Due to lost follow up and miscommunications, she was off treatment 09/26/19 - 04/29/20. She tolerated well aside from reaction to the injection site. -she started Ibrance 75 mg 01/09/22 due to worsening adenopathy, stopped after two weeks due to fatigue and diarrhea. -Guardant 360 03/16/22 showed no ESR 1 mutation or other targetable mutations .  -fulvestrant discontinued after 04/13/22 due to intolerance to injection.  -restaging PET scan 06/14/22 showed overall stable disease. -she tells me today she started exemestane and Afinitor (could not tell me when she started either, and there was also confusion with myrbetriq, which she started around the same time) and experienced nausea and fatigue. She also fell several days later and stopped taking all her medicines. I advised her to continue the myrbetriq per her urologist and continue holding the exemestane and Afinitor until I see her again. We will follow back up in a month.     Plan  -hold exemestane and Afinitor due to her nausea and fatigue  -lab and f/u in 4 weeks   No problem-specific Assessment & Plan notes found for this encounter.   SUMMARY OF ONCOLOGIC HISTORY: Oncology History Overview Note  Cancer of central portion of left female breast [facility])   Staging form: Breast, AJCC 7th Edition     Pathologic stage from 08/04/2010: Stage IIB (T2, N1a, cM0) - Signed by Truitt Merle, MD on 12/09/2015      Cancer of central portion of left female breast ([place])  08/03/2010 Mammogram    left nipple retraction, and the palpable mass subareolar left breast at the 6:00 position. Ultrasound confirmed the presence of a mass measuring 2.6 x 1.7 x 2.4 cm  08/04/2010 Initial Biopsy   left breast mass biopsy showed an invasive mammary carcinoma with lobular features. ER 95%, PR 97%, Ki-67 17%, HER-2/neu (-)   08/04/2010 Pathologic Stage   Stage IIB: T2 N1a   08/11/2010 Imaging   left breast mass 4.6 x 4.2 x 2.3 cm with  enhancement distortion extending to the nipple retraction. There is questionable anterior mediastinal lymph node seen   07/2010 - 09/03/2010 Anti-estrogen oral therapy   neoadjuvant letrozole    09/03/2010 Surgery    left breast mastectomy with sentinel node biopsy on 09/03/2010.   08/2010 - 01/2015 Anti-estrogen oral therapy   Tamoxifen, pt stopped on her own    12/03/2010 - 01/23/2011 Radiation Therapy   left chest wall and axilla radiaiton    10/19/2015 Progression   Left chest wall recurrence, s/p resection on 11/25/2015 with positive margins ([facility])    12/10/2015 - 05/31/2017 Anti-estrogen oral therapy   anastrozole 47m daily, which was switched to Tamoxifen on 06/30/2016 due to persistent disease.   01/03/2016 - 03/03/2016 Radiation Therapy   Radiation to left chest wall recurrence (12/06/2015-02/04/2016 - 44 Gy), pt progressed through the radiation, and had additional 10 fractions of radiation (02/21/16-03/03/2016 - total 70 Gy to the left chest wall)   07/14/2016 PET scan   PET 07/14/2016 IMPRESSION: 1. No findings of recurrent malignancy. 2. Radiation fibrosis in the lingula. A small left axillary lymph node is not hypermetabolic and only 6 mm in diameter. This is in the vicinity of the prior axillary cyst. 3. Pelvic floor laxity.   07/17/2016 Surgery   Chest wall soft tissue resection for local recurrence   07/17/2016 Pathology Results   Left chest wall two soft tissue resection both showed invasive lobular carcinoma, margins were positive. ER 95% positive, PR 60% positive, HER-2 negative.   07/27/2016 Imaging   CT chest, abdomen and pelvis wo contrast  07/27/2016 IMPRESSION: Sigmoid diverticulosis without acute diverticulitis. No bowel obstruction or acute inflammation. Degenerative disc disease and facet arthropathy of the lower lumbar spine with slight grade 1 anterolisthesis of L4 on L5. Stable appearing simple left-sided renal cysts.   03/01/2017 Mammogram   Mammogram  03/01/2017 IMPRESSION: No mammographic evidence of malignancy. A result letter of this screening mammogram will be mailed directly to the patient.  RECOMMENDATION: Screening mammogram in one year   04/10/2017 Imaging   PET IMPRESSION: 1. Stable exam.  No findings of recurrent malignancy. 2. Changes of external beam radiation again noted within the lingula. 3. Small left axillary lymph node exhibits mild increased uptake. Unchanged from previous exam.   06/07/2017 -  Anti-estrogen oral therapy   Stopped Tamoxifen and Began monthly Faslodex IM injection on 06/07/17  -Held after 09/26/19 due to lost f/u. Restart with loading dose on 04/29/20.    09/19/2017 Imaging   CT CAP WO Contrast 09/19/17 IMPRESSION: 1. Surgical changes from a left mastectomy without CT findings to suggest recurrent chest wall tumor or axillary lymphadenopathy. 2. No findings to suggest metastatic disease involving the neck, chest, abdomen or pelvis or osseous structures. 3. Stable radiation changes involving the lingula.    09/19/2017 Imaging   Bone Scan Whole Body  IMPRESSION: No definite scintigraphic evidence of osseous metastatic disease.  Scattered degenerative type uptake as above.   03/11/2018 PET scan   IMPRESSION:  1. No evidence of recurrent disease. 2.  Aortic atherosclerosis    09/03/2018 PET scan   09/03/2018 PET Scan IMPRESSION: 1. No findings to suggest locally recurrent disease or metastatic disease  in the neck, chest, abdomen or pelvis. 2. Hepatic steatosis. 3. Colonic diverticulosis without evidence of acute diverticulitis at this time. 4. Aortic atherosclerosis. 5. Additional incidental findings, as above.   11/25/2018 Imaging   DG Ribs Unilateral W/Chest Left  IMPRESSION: Stable left lingular scarring. Normal left ribs. No acute cardiopulmonary abnormality seen.    04/04/2019 PET scan   PET  IMPRESSION: 1. No evidence of hypermetabolic recurrent or metastatic disease. 2. Hepatic  steatosis. 3.  Aortic Atherosclerosis (ICD10-I70.0).   05/18/2020 PET scan   IMPRESSION: 1. Findings of LEFT mastectomy and axillary dissection with radiation changes, no signs of metastatic disease or disease recurrence at this time. 2. Hepatic steatosis. 3. Aortic atherosclerosis.   Aortic Atherosclerosis (ICD10-I70.0).     11/09/2020 PET scan   IMPRESSION: 1. No findings of hypermetabolic metastatic disease in the neck, chest, abdomen or pelvis. 2. Hepatic steatosis. 3. Aortic atherosclerosis. 4. Increased metabolism about the sphincter complex and lower rectum more likely physiologic; however, consider digital rectal exam and correlation with any symptoms in this location.   05/03/2021 PET scan   IMPRESSION: Status post left mastectomy with left axillary lymph node dissection. Radiation changes in the left upper lobe/lingula.   No evidence of recurrent or metastatic disease.   12/19/2021 PET scan   IMPRESSION: 1. New or increasing abdominal retroperitoneal adenopathy and soft tissue stranding/thickening along the left periaortic station, with mild hypermetabolism. Findings are worrisome for malignancy, possibly unrelated to breast cancer. 2. Left pelvicaliceal dilatation/obstruction with increasing left perinephric stranding. 3. Consider CT or MR abdomen pelvis without and with contrast in further evaluation, as clinically indicated. 4. Aortic atherosclerosis (ICD10-I70.0). Coronary artery calcification.      INTERVAL HISTORY:  LADEANA LAPLANT was contacted for a follow up of breast cancer. She was last seen by me on 06/22/22. She reports fatigue and nausea. I was unable to discern if she developed nausea from exemestane or Afinitor. She tells me she took these briefly, then she fell and stopped taking both of them.   All other systems were reviewed with the patient and are negative.  MEDICAL HISTORY:  Past Medical History:  Diagnosis Date   Anxiety    Blood in urine     CAD (coronary artery disease)    non obstructive by cath   Cancer [facility])    breast- left   COVID    Depression    Family history of breast cancer    Family history of colon cancer    Family history of ovarian cancer    Family history of pancreatic cancer    Family history of prostate cancer    GERD (gastroesophageal reflux disease)    Hypertension    Knee fracture    Personal history of radiation therapy 2017   TR (tricuspid regurgitation)    mild by Echo 12/2008 EF >55%    SURGICAL HISTORY: Past Surgical History:  Procedure Laterality Date   ABDOMINAL AORTAGRAM N/A 09/08/2011   Procedure: ABDOMINAL Maxcine Ham;  Surgeon: Troy Sine, MD;  Location: [facility] CATH LAB;  Service: Cardiovascular;  Laterality: N/A;   ABDOMINAL HYSTERECTOMY     BREAST EXCISIONAL BIOPSY Left 2017   X3   BREAST SURGERY  08-30-10   mastectomy   CARDIAC CATHETERIZATION  08/2011   20% LAD stenosis, 20% diagonal stenosis, 10-20% proximal dominant RCA stenosis   COLON SURGERY     LEFT HEART CATHETERIZATION WITH CORONARY ANGIOGRAM N/A 09/08/2011   Procedure: LEFT HEART CATHETERIZATION WITH CORONARY ANGIOGRAM;  Surgeon:  Troy Sine, MD;  Location: [facility] C[facility] LAB;  Service: Cardiovascular;  Laterality: N/A;   MASTECTOMY     MINOR BREAST BIOPSY Left 11/25/2015   Procedure: MINOR EXCISION MASS LEFT CHEST WALL;  Surgeon: Autumn Messing III, MD;  Location: [place];  Service: General;  Laterality: Left;   MINOR BREAST BIOPSY Left 07/17/2016   Procedure: EXCISION OF 2 LEFT CHEST WALL MASSES;  Surgeon: Autumn Messing III, MD;  Location: [place];  Service: General;  Laterality: Left;   TONSILLECTOMY      I have reviewed the social history and family history with the patient and they are unchanged from previous note.  ALLERGIES:  is allergic to contrast media [iodinated contrast media], penicillins, adhesive [tape], cephalexin, ciprofloxacin, demerol, meperidine hcl, sulfa antibiotics,  sulfamethoxazole-trimethoprim, and quinolones.  MEDICATIONS:  Current Outpatient Medications  Medication Sig Dispense Refill   acetaminophen (TYLENOL) 500 MG tablet Take 1,000 mg by mouth daily as needed for moderate pain. Reported on 03/10/2016     ALPRAZolam (XANAX) 0.25 MG tablet Take by mouth.     benzonatate (TESSALON) 100 MG capsule Take 1 capsule (100 mg total) by mouth every 8 (eight) hours. 21 capsule 0   CALCIUM-MAGNESIUM-VITAMIN D PO Take 1 tablet by mouth 2 (two) times daily.     dexamethasone (DECADRON) 0.5 MG/5ML solution Take 10 mLs by mouth in the morning, at noon, in the evening, and at bedtime. Swish for 2 minutes in mouth and then spit. Do not eat or drink for 1 hour after mouth rinse. 500 mL 4   doxepin (SINEQUAN) 10 MG capsule Take 10 mg by mouth at bedtime.     doxycycline (VIBRA-TABS) 100 MG tablet Take 1 tablet (100 mg total) by mouth 2 (two) times daily. (Patient not taking: Reported on 06/06/2022) 14 tablet 0   everolimus (AFINITOR) 5 MG tablet Take 1 tablet (5 mg total) by mouth daily. 30 tablet 0   exemestane (AROMASIN) 25 MG tablet Take 1 tablet by mouth daily after breakfast. 30 tablet 2   famotidine (PEPCID) 20 MG tablet Take 20 mg by mouth daily.     FLOVENT HFA 44 MCG/ACT inhaler Inhale into the lungs.     fluticasone (FLONASE) 50 MCG/ACT nasal spray Place 1 spray into both nostrils daily as needed for allergies.      guaiFENesin (MUCINEX) 600 MG 12 hr tablet Take 600 mg by mouth daily as needed for cough or to loosen phlegm. Reported on 03/28/2016     hydrochlorothiazide (MICROZIDE) 12.5 MG capsule Take 1 capsule (12.5 mg total) by mouth daily. Please schedule appointment for further refills 30 capsule 1   losartan (COZAAR) 50 MG tablet Take 1 tablet by mouth once daily 90 tablet 0   mupirocin ointment (BACTROBAN) 2 % Apply 1 application. topically 2 (two) times daily. 30 g 2   nitroGLYCERIN (NITROSTAT) 0.4 MG SL tablet Place 1 tablet (0.4 mg total) under the tongue  every 5 (five) minutes as needed for chest pain. <PLEASE MAKE APPOINTMENT> 25 tablet 3   ondansetron (ZOFRAN) 4 MG tablet Take 1 tablet (4 mg total) by mouth every 8 (eight) hours as needed for nausea or vomiting. OK TO TAKE 2 TABS as needed if 1 tab does not work 30 tablet 1   pseudoephedrine-acetaminophen (TYLENOL SINUS) 30-500 MG TABS tablet Take 1 tablet by mouth every 4 (four) hours as needed.     No current facility-administered medications for this visit.    PHYSICAL EXAMINATION:  ECOG PERFORMANCE STATUS: 2 - Symptomatic, <50% confined to bed  There were no vitals filed for this visit. Wt Readings from Last 3 Encounters:  06/22/22 130 lb 6.4 oz (59.1 kg)  06/06/22 128 lb (58.1 kg)  05/19/22 128 lb 4.8 oz (58.2 kg)     No vitals taken today, Exam not performed today  LABORATORY DATA:  I have reviewed the data as listed    Latest Ref Rng & Units 06/22/2022   11:43 AM 05/19/2022   12:52 PM 04/13/2022   10:55 AM  CBC  WBC 4.0 - 10.5 K/uL 5.0  5.1  5.0   Hemoglobin 12.0 - 15.0 g/dL 11.6  11.9  12.2   Hematocrit 36.0 - 46.0 % 34.6  35.8  36.2   Platelets 150 - 400 K/uL 152  159  138         Latest Ref Rng & Units 06/22/2022   11:43 AM 05/19/2022   12:52 PM 04/13/2022   10:55 AM  CMP  Glucose 70 - 99 mg/dL 84  79  92   BUN 8 - 23 mg/dL 28  23  26    Creatinine 0.44 - 1.00 mg/dL 1.15  1.16  1.12   Sodium 135 - 145 mmol/L 138  138  142   Potassium 3.5 - 5.1 mmol/L 4.3  4.0  3.6   Chloride 98 - 111 mmol/L 104  105  108   CO2 22 - 32 mmol/L 29  28  27    Calcium 8.9 - 10.3 mg/dL 9.5  9.2  9.6   Total Protein 6.5 - 8.1 g/dL 6.4  6.3  6.3   Total Bilirubin 0.3 - 1.2 mg/dL 0.3  0.6  0.5   Alkaline Phos 38 - 126 U/L 89  68  85   AST 15 - 41 U/L 21  23  22    ALT 0 - 44 U/L 11  12  17        RADIOGRAPHIC STUDIES: I have personally reviewed the radiological images as listed and agreed with the findings in the report. No results found.    No orders of the defined types were  placed in this encounter.  All questions were answered. The patient knows to call the clinic with any problems, questions or concerns. No barriers to learning was detected. The total time spent in the appointment was 15 minutes.     Truitt Merle, MD 07/20/2022   I, Wilburn Mylar, am acting as scribe for Truitt Merle, MD.   I have reviewed the above documentation for accuracy and completeness, and I agree with the above.

## 2022-07-21 ENCOUNTER — Other Ambulatory Visit: Payer: Self-pay | Admitting: Hematology

## 2022-07-24 ENCOUNTER — Encounter: Payer: Self-pay | Admitting: Podiatry

## 2022-07-24 ENCOUNTER — Ambulatory Visit: Payer: PPO | Admitting: Podiatry

## 2022-07-24 DIAGNOSIS — I739 Peripheral vascular disease, unspecified: Secondary | ICD-10-CM

## 2022-07-24 DIAGNOSIS — M79675 Pain in left toe(s): Secondary | ICD-10-CM

## 2022-07-24 DIAGNOSIS — B351 Tinea unguium: Secondary | ICD-10-CM | POA: Diagnosis not present

## 2022-07-24 DIAGNOSIS — M79674 Pain in right toe(s): Secondary | ICD-10-CM | POA: Diagnosis not present

## 2022-07-24 DIAGNOSIS — L600 Ingrowing nail: Secondary | ICD-10-CM

## 2022-07-24 NOTE — Patient Instructions (Signed)

## 2022-07-24 NOTE — Progress Notes (Signed)
  Subjective:  Patient ID: Gloria [name], female    DOB: 11-10-1927,   MRN: [id number]  Chief Complaint  Patient presents with   Nail Problem     Patient states her toenails are painful. Especially her left great toe very - constant pain. Patient states she is also concerned about circulation issues . Patient daughter sandy states she does have circulation issues     88 y.o. female presents for concern of painful toenails as well as concern for circulation issues.  Here with daughter who relates she has been complaining of pain in her feet and toes but not sure if it is just the nails or a circulation issues. She has has recent ABIs and everything normal.  Appears most pain is coming from left great hallux toenail. Requesting to have all nails trimmed today as well.  . Denies any other pedal complaints. Denies n/v/f/c.   Past Medical History:  Diagnosis Date   Anxiety    Blood in urine    CAD (coronary artery disease)    non obstructive by cath   Cancer [facility])    breast- left   COVID    Depression    Family history of breast cancer    Family history of colon cancer    Family history of ovarian cancer    Family history of pancreatic cancer    Family history of prostate cancer    GERD (gastroesophageal reflux disease)    Hypertension    Knee fracture    Personal history of radiation therapy 2017   TR (tricuspid regurgitation)    mild by Echo 12/2008 EF >55%    Objective:  Physical Exam: Vascular: DP/PT pulses 2/4 bilateral. CFT <3 seconds. Absent hair growth on digits. Edema noted to bilateral lower extremities. Xerosis noted bilaterally.  Skin. No lacerations or abrasions bilateral feet. Nails 1-5 bilateral  are thickened discolored and elongated with subungual debris.  Musculoskeletal: MMT 5/5 bilateral lower extremities in DF, PF, Inversion and Eversion. Deceased ROM in DF of ankle joint.  Neurological: Sensation intact to light touch. Protective sensation intact bilateral.      ABI Right: Resting right ankle-brachial index is within normal range. No  evidence of significant right lower extremity arterial disease. The right  toe-brachial index is normal.   Left: The left toe-brachial index is normal.   Unable to obtain ABI due to pain in calf area. However, waveforms are  triphasic and within normal limits.   Assessment:   1. Pain in toes of both feet   2. Dermatophytosis of nail   3. Ingrown toenail   4. PAD (peripheral artery disease) ([place])      Plan:  Patient was evaluated and treated and all questions answered. -Discussed and educated patient onoot care, especially with  regards to the vascular, neurological and musculoskeletal systems.  -Discussed supportive shoes at all times and checking feet regularly.  -Mechanically debrided all nails 1-5 bilateral using sterile nail nipper and filed with dremel without incident  -Left hallux nail was debrided back in slant back fashion mediall and silvadene and bandaid applied. Soaking instructions given.  -Answered all patient questions -Patient to return  in 3 months for at risk foot care -Patient advised to call the office if any problems or questions arise in the meantime.   Lorenda Peck, DPM

## 2022-07-25 DIAGNOSIS — Z87891 Personal history of nicotine dependence: Secondary | ICD-10-CM | POA: Diagnosis not present

## 2022-07-25 DIAGNOSIS — N1831 Chronic kidney disease, stage 3a: Secondary | ICD-10-CM | POA: Diagnosis not present

## 2022-07-25 DIAGNOSIS — J449 Chronic obstructive pulmonary disease, unspecified: Secondary | ICD-10-CM | POA: Diagnosis not present

## 2022-07-25 DIAGNOSIS — Z7951 Long term (current) use of inhaled steroids: Secondary | ICD-10-CM | POA: Diagnosis not present

## 2022-07-26 DIAGNOSIS — H903 Sensorineural hearing loss, bilateral: Secondary | ICD-10-CM | POA: Diagnosis not present

## 2022-07-26 DIAGNOSIS — H6123 Impacted cerumen, bilateral: Secondary | ICD-10-CM | POA: Diagnosis not present

## 2022-08-04 ENCOUNTER — Telehealth: Payer: Self-pay | Admitting: Hematology

## 2022-08-04 NOTE — Telephone Encounter (Signed)
Scheduled follow-up appointment per 9/21 los. Patient's daughter is aware.

## 2022-08-07 ENCOUNTER — Other Ambulatory Visit: Payer: Self-pay | Admitting: Cardiovascular Disease

## 2022-08-25 ENCOUNTER — Other Ambulatory Visit: Payer: Self-pay

## 2022-08-25 ENCOUNTER — Inpatient Hospital Stay: Payer: PPO

## 2022-08-25 ENCOUNTER — Inpatient Hospital Stay: Payer: PPO | Attending: Hematology | Admitting: Hematology

## 2022-08-25 ENCOUNTER — Encounter: Payer: Self-pay | Admitting: Hematology

## 2022-08-25 VITALS — BP 119/79 | HR 77 | Temp 97.7°F | Resp 18 | Ht 61.0 in | Wt 130.2 lb

## 2022-08-25 DIAGNOSIS — Z803 Family history of malignant neoplasm of breast: Secondary | ICD-10-CM | POA: Insufficient documentation

## 2022-08-25 DIAGNOSIS — Z170 Estrogen receptor positive status [ER+]: Secondary | ICD-10-CM

## 2022-08-25 DIAGNOSIS — Z923 Personal history of irradiation: Secondary | ICD-10-CM | POA: Diagnosis not present

## 2022-08-25 DIAGNOSIS — C50112 Malignant neoplasm of central portion of left female breast: Secondary | ICD-10-CM | POA: Diagnosis not present

## 2022-08-25 DIAGNOSIS — Z800 Family history of malignant neoplasm of digestive organs: Secondary | ICD-10-CM | POA: Insufficient documentation

## 2022-08-25 DIAGNOSIS — Z9012 Acquired absence of left breast and nipple: Secondary | ICD-10-CM | POA: Insufficient documentation

## 2022-08-25 DIAGNOSIS — Z8042 Family history of malignant neoplasm of prostate: Secondary | ICD-10-CM | POA: Diagnosis not present

## 2022-08-25 DIAGNOSIS — Z8041 Family history of malignant neoplasm of ovary: Secondary | ICD-10-CM | POA: Insufficient documentation

## 2022-08-25 DIAGNOSIS — Z90710 Acquired absence of both cervix and uterus: Secondary | ICD-10-CM | POA: Diagnosis not present

## 2022-08-25 LAB — CMP (CANCER CENTER ONLY)
ALT: 15 U/L (ref 0–44)
AST: 25 U/L (ref 15–41)
Albumin: 3.9 g/dL (ref 3.5–5.0)
Alkaline Phosphatase: 74 U/L (ref 38–126)
Anion gap: 5 (ref 5–15)
BUN: 26 mg/dL — ABNORMAL HIGH (ref 8–23)
CO2: 29 mmol/L (ref 22–32)
Calcium: 9.2 mg/dL (ref 8.9–10.3)
Chloride: 105 mmol/L (ref 98–111)
Creatinine: 1.25 mg/dL — ABNORMAL HIGH (ref 0.44–1.00)
GFR, Estimated: 40 mL/min — ABNORMAL LOW
Glucose, Bld: 108 mg/dL — ABNORMAL HIGH (ref 70–99)
Potassium: 4.1 mmol/L (ref 3.5–5.1)
Sodium: 139 mmol/L (ref 135–145)
Total Bilirubin: 0.5 mg/dL (ref 0.3–1.2)
Total Protein: 6.7 g/dL (ref 6.5–8.1)

## 2022-08-25 LAB — CBC WITH DIFFERENTIAL (CANCER CENTER ONLY)
Abs Immature Granulocytes: 0.02 K/uL (ref 0.00–0.07)
Basophils Absolute: 0 K/uL (ref 0.0–0.1)
Basophils Relative: 1 %
Eosinophils Absolute: 0.3 K/uL (ref 0.0–0.5)
Eosinophils Relative: 9 %
HCT: 34.4 % — ABNORMAL LOW (ref 36.0–46.0)
Hemoglobin: 11.5 g/dL — ABNORMAL LOW (ref 12.0–15.0)
Immature Granulocytes: 1 %
Lymphocytes Relative: 19 %
Lymphs Abs: 0.7 K/uL (ref 0.7–4.0)
MCH: 32.3 pg (ref 26.0–34.0)
MCHC: 33.4 g/dL (ref 30.0–36.0)
MCV: 96.6 fL (ref 80.0–100.0)
Monocytes Absolute: 0.4 K/uL (ref 0.1–1.0)
Monocytes Relative: 11 %
Neutro Abs: 2.3 K/uL (ref 1.7–7.7)
Neutrophils Relative %: 59 %
Platelet Count: 149 K/uL — ABNORMAL LOW (ref 150–400)
RBC: 3.56 MIL/uL — ABNORMAL LOW (ref 3.87–5.11)
RDW: 13.4 % (ref 11.5–15.5)
WBC Count: 3.9 K/uL — ABNORMAL LOW (ref 4.0–10.5)
nRBC: 0 % (ref 0.0–0.2)

## 2022-08-25 MED ORDER — ONDANSETRON HCL 4 MG PO TABS: ORAL_TABLET | ORAL | 2 refills | Status: DC

## 2022-08-25 NOTE — Progress Notes (Signed)
Gloria [name]   [phone] [phone]   Clinic Follow up Note   Patient Care Team: Charlane Ferretti, MD as PCP - General (Internal Medicine) Jovita Kussmaul, MD as Consulting Physician (General Surgery) Truitt Merle, MD as Consulting Physician (Hematology) [name], MD as Consulting Physician (Radiation Oncology) Sylvan Cheese, NP as Nurse Practitioner (Hematology and Oncology) Karen Kays, NP as Nurse Practitioner (Nurse Practitioner)  Date of Service:  08/25/2022  CHIEF COMPLAINT: f/u of left breast cancer  CURRENT THERAPY:  Surveillance  ASSESSMENT & PLAN:  Gloria [name] is a 88 y.o. post-hysterectomy female with  1. Cancer of the central portion of left female breast, Stage IIB, T2 N1 invasive lobular carcinoma, grade 2, ER+/PR+, HER-2-, local chest wall recurrence in 09/2015, node mets in 11/2021 -initially diagnosed in 07/2010, treated with mastectomy and adjuvant tamoxifen for 4.5 years.  -She developed local chest wall recurrence in 09/2015 and completed chest wall radiation. -she had another recurrence in 10/2015, s/p resection in 06/2016, surgical margins were positive. Dr. Marlou Starks did not offer more surgery because complete surgical resection was unlikely. Pt agreed.  -She started monthly fulvestrant injection on 06/07/17. Due to lost follow up and miscommunications, she was off treatment 09/26/19 - 04/29/20. She tolerated well aside from reaction to the injection site. -she started Ibrance 75 mg 01/09/22 due to worsening adenopathy, stopped after two weeks due to fatigue and diarrhea. -Guardant 360 03/16/22 showed no ESR 1 mutation or other targetable mutations.  -fulvestrant discontinued after 04/13/22 due to intolerance to injection.  -restaging PET scan 06/14/22 showed overall stable disease. -she took one dose exemestane and Afinitor (together) but developed worsening nausea and stopped.  Due to her advanced age, I do not feel  strongly about restarting treatment with exemestane, and she would like to proceed with disease monitoring alone. -she is clinically doing well aside from continued bladder issues. I encouraged her to connect with her urologist about options. Labs reviewed, overall stable. Her chest wall looks stable today. There is no clinical concern for progression. -f/u in 3 months     Plan  -lab and f/u in 3 months   No problem-specific Assessment & Plan notes found for this encounter.   SUMMARY OF ONCOLOGIC HISTORY: Oncology History Overview Note  Cancer of central portion of left female breast [facility])   Staging form: Breast, AJCC 7th Edition     Pathologic stage from 08/04/2010: Stage IIB (T2, N1a, cM0) - Signed by Truitt Merle, MD on 12/09/2015      Cancer of central portion of left female breast ([place])  08/03/2010 Mammogram    left nipple retraction, and the palpable mass subareolar left breast at the 6:00 position. Ultrasound confirmed the presence of a mass measuring 2.6 x 1.7 x 2.4 cm   08/04/2010 Initial Biopsy   left breast mass biopsy showed an invasive mammary carcinoma with lobular features. ER 95%, PR 97%, Ki-67 17%, HER-2/neu (-)   08/04/2010 Pathologic Stage   Stage IIB: T2 N1a   08/11/2010 Imaging   left breast mass 4.6 x 4.2 x 2.3 cm with enhancement distortion extending to the nipple retraction. There is questionable anterior mediastinal lymph node seen   07/2010 - 09/03/2010 Anti-estrogen oral therapy   neoadjuvant letrozole    09/03/2010 Surgery    left breast mastectomy with sentinel node biopsy on 09/03/2010.   08/2010 - 01/2015 Anti-estrogen oral therapy   Tamoxifen, pt stopped on her own    12/03/2010 -  01/23/2011 Radiation Therapy   left chest wall and axilla radiaiton    10/19/2015 Progression   Left chest wall recurrence, s/p resection on 11/25/2015 with positive margins ([facility])    12/10/2015 - 05/31/2017 Anti-estrogen oral therapy   anastrozole 30m daily, which was switched to  Tamoxifen on 06/30/2016 due to persistent disease.   01/03/2016 - 03/03/2016 Radiation Therapy   Radiation to left chest wall recurrence (12/06/2015-02/04/2016 - 44 Gy), pt progressed through the radiation, and had additional 10 fractions of radiation (02/21/16-03/03/2016 - total 70 Gy to the left chest wall)   07/14/2016 PET scan   PET 07/14/2016 IMPRESSION: 1. No findings of recurrent malignancy. 2. Radiation fibrosis in the lingula. A small left axillary lymph node is not hypermetabolic and only 6 mm in diameter. This is in the vicinity of the prior axillary cyst. 3. Pelvic floor laxity.   07/17/2016 Surgery   Chest wall soft tissue resection for local recurrence   07/17/2016 Pathology Results   Left chest wall two soft tissue resection both showed invasive lobular carcinoma, margins were positive. ER 95% positive, PR 60% positive, HER-2 negative.   07/27/2016 Imaging   CT chest, abdomen and pelvis wo contrast  07/27/2016 IMPRESSION: Sigmoid diverticulosis without acute diverticulitis. No bowel obstruction or acute inflammation. Degenerative disc disease and facet arthropathy of the lower lumbar spine with slight grade 1 anterolisthesis of L4 on L5. Stable appearing simple left-sided renal cysts.   03/01/2017 Mammogram   Mammogram 03/01/2017 IMPRESSION: No mammographic evidence of malignancy. A result letter of this screening mammogram will be mailed directly to the patient.  RECOMMENDATION: Screening mammogram in one year   04/10/2017 Imaging   PET IMPRESSION: 1. Stable exam.  No findings of recurrent malignancy. 2. Changes of external beam radiation again noted within the lingula. 3. Small left axillary lymph node exhibits mild increased uptake. Unchanged from previous exam.   06/07/2017 -  Anti-estrogen oral therapy   Stopped Tamoxifen and Began monthly Faslodex IM injection on 06/07/17  -Held after 09/26/19 due to lost f/u. Restart with loading dose on 04/29/20.    09/19/2017 Imaging   CT  CAP WO Contrast 09/19/17 IMPRESSION: 1. Surgical changes from a left mastectomy without CT findings to suggest recurrent chest wall tumor or axillary lymphadenopathy. 2. No findings to suggest metastatic disease involving the neck, chest, abdomen or pelvis or osseous structures. 3. Stable radiation changes involving the lingula.    09/19/2017 Imaging   Bone Scan Whole Body  IMPRESSION: No definite scintigraphic evidence of osseous metastatic disease.  Scattered degenerative type uptake as above.   03/11/2018 PET scan   IMPRESSION:  1. No evidence of recurrent disease. 2.  Aortic atherosclerosis    09/03/2018 PET scan   09/03/2018 PET Scan IMPRESSION: 1. No findings to suggest locally recurrent disease or metastatic disease in the neck, chest, abdomen or pelvis. 2. Hepatic steatosis. 3. Colonic diverticulosis without evidence of acute diverticulitis at this time. 4. Aortic atherosclerosis. 5. Additional incidental findings, as above.   11/25/2018 Imaging   DG Ribs Unilateral W/Chest Left  IMPRESSION: Stable left lingular scarring. Normal left ribs. No acute cardiopulmonary abnormality seen.    04/04/2019 PET scan   PET  IMPRESSION: 1. No evidence of hypermetabolic recurrent or metastatic disease. 2. Hepatic steatosis. 3.  Aortic Atherosclerosis (ICD10-I70.0).   05/18/2020 PET scan   IMPRESSION: 1. Findings of LEFT mastectomy and axillary dissection with radiation changes, no signs of metastatic disease or disease recurrence at this time. 2. Hepatic steatosis. 3.  Aortic atherosclerosis.   Aortic Atherosclerosis (ICD10-I70.0).     11/09/2020 PET scan   IMPRESSION: 1. No findings of hypermetabolic metastatic disease in the neck, chest, abdomen or pelvis. 2. Hepatic steatosis. 3. Aortic atherosclerosis. 4. Increased metabolism about the sphincter complex and lower rectum more likely physiologic; however, consider digital rectal exam and correlation with any symptoms in  this location.   05/03/2021 PET scan   IMPRESSION: Status post left mastectomy with left axillary lymph node dissection. Radiation changes in the left upper lobe/lingula.   No evidence of recurrent or metastatic disease.   12/19/2021 PET scan   IMPRESSION: 1. New or increasing abdominal retroperitoneal adenopathy and soft tissue stranding/thickening along the left periaortic station, with mild hypermetabolism. Findings are worrisome for malignancy, possibly unrelated to breast cancer. 2. Left pelvicaliceal dilatation/obstruction with increasing left perinephric stranding. 3. Consider CT or MR abdomen pelvis without and with contrast in further evaluation, as clinically indicated. 4. Aortic atherosclerosis (ICD10-I70.0). Coronary artery calcification.      INTERVAL HISTORY:  Gloria [name] is here for a follow up of breast cancer. She was last seen by me on 07/20/22. She presents to the clinic accompanied by her daughter. She reports she is doing well overall. She tells me her main concern is her bladder issues, for which she previously tried Veterinary surgeon. She stopped the myrbetriq following a fall and has not restarted due to fear of another fall.   All other systems were reviewed with the patient and are negative.  MEDICAL HISTORY:  Past Medical History:  Diagnosis Date   Anxiety    Blood in urine    CAD (coronary artery disease)    non obstructive by cath   Cancer [facility])    breast- left   COVID    Depression    Family history of breast cancer    Family history of colon cancer    Family history of ovarian cancer    Family history of pancreatic cancer    Family history of prostate cancer    GERD (gastroesophageal reflux disease)    Hypertension    Knee fracture    Personal history of radiation therapy 2017   TR (tricuspid regurgitation)    mild by Echo 12/2008 EF >55%    SURGICAL HISTORY: Past Surgical History:  Procedure Laterality Date   ABDOMINAL AORTAGRAM N/A  09/08/2011   Procedure: ABDOMINAL Maxcine Ham;  Surgeon: Troy Sine, MD;  Location: [facility] CATH LAB;  Service: Cardiovascular;  Laterality: N/A;   ABDOMINAL HYSTERECTOMY     BREAST EXCISIONAL BIOPSY Left 2017   X3   BREAST SURGERY  08-30-10   mastectomy   CARDIAC CATHETERIZATION  08/2011   20% LAD stenosis, 20% diagonal stenosis, 10-20% proximal dominant RCA stenosis   COLON SURGERY     LEFT HEART CATHETERIZATION WITH CORONARY ANGIOGRAM N/A 09/08/2011   Procedure: LEFT HEART CATHETERIZATION WITH CORONARY ANGIOGRAM;  Surgeon: Troy Sine, MD;  Location: [facility] CATH LAB;  Service: Cardiovascular;  Laterality: N/A;   MASTECTOMY     MINOR BREAST BIOPSY Left 11/25/2015   Procedure: MINOR EXCISION MASS LEFT CHEST WALL;  Surgeon: Autumn Messing III, MD;  Location: [place];  Service: General;  Laterality: Left;   MINOR BREAST BIOPSY Left 07/17/2016   Procedure: EXCISION OF 2 LEFT CHEST WALL MASSES;  Surgeon: Autumn Messing III, MD;  Location: [place];  Service: General;  Laterality: Left;   TONSILLECTOMY      I have reviewed the social  history and family history with the patient and they are unchanged from previous note.  ALLERGIES:  is allergic to contrast media [iodinated contrast media], penicillins, adhesive [tape], cephalexin, ciprofloxacin, demerol, meperidine hcl, sulfa antibiotics, sulfamethoxazole-trimethoprim, and quinolones.  MEDICATIONS:  Current Outpatient Medications  Medication Sig Dispense Refill   acetaminophen (TYLENOL) 500 MG tablet Take 1,000 mg by mouth daily as needed for moderate pain. Reported on 03/10/2016     ALPRAZolam (XANAX) 0.25 MG tablet Take by mouth.     benzonatate (TESSALON) 100 MG capsule Take 1 capsule (100 mg total) by mouth every 8 (eight) hours. 21 capsule 0   CALCIUM-MAGNESIUM-VITAMIN D PO Take 1 tablet by mouth 2 (two) times daily.     dexamethasone (DECADRON) 0.5 MG/5ML solution Take 10 mLs by mouth in the morning, at noon, in the  evening, and at bedtime. Swish for 2 minutes in mouth and then spit. Do not eat or drink for 1 hour after mouth rinse. 500 mL 4   doxepin (SINEQUAN) 10 MG capsule Take 10 mg by mouth at bedtime.     doxycycline (VIBRA-TABS) 100 MG tablet Take 1 tablet (100 mg total) by mouth 2 (two) times daily. (Patient not taking: Reported on 06/06/2022) 14 tablet 0   everolimus (AFINITOR) 5 MG tablet Take 1 tablet (5 mg total) by mouth daily. 30 tablet 0   exemestane (AROMASIN) 25 MG tablet Take 1 tablet by mouth daily after breakfast. 30 tablet 2   famotidine (PEPCID) 20 MG tablet Take 20 mg by mouth daily.     FLOVENT HFA 44 MCG/ACT inhaler Inhale into the lungs.     fluticasone (FLONASE) 50 MCG/ACT nasal spray Place 1 spray into both nostrils daily as needed for allergies.      guaiFENesin (MUCINEX) 600 MG 12 hr tablet Take 600 mg by mouth daily as needed for cough or to loosen phlegm. Reported on 03/28/2016     hydrochlorothiazide (MICROZIDE) 12.5 MG capsule Take 1 capsule (12.5 mg total) by mouth daily. Please schedule appointment for further refills 30 capsule 1   losartan (COZAAR) 50 MG tablet Take 1 tablet by mouth once daily 90 tablet 0   mupirocin ointment (BACTROBAN) 2 % Apply 1 application. topically 2 (two) times daily. 30 g 2   nitroGLYCERIN (NITROSTAT) 0.4 MG SL tablet Place 1 tablet (0.4 mg total) under the tongue every 5 (five) minutes as needed for chest pain. <PLEASE MAKE APPOINTMENT> 25 tablet 3   ondansetron (ZOFRAN) 4 MG tablet TAKE 1 TABLET BY MOUTH EVERY 8 HOURS AS NEEDED FOR NAUSEA OR VOMITING. OK TO TAKE 2 TABLET AS NEEDED IF 1 TAB DOES NOT WORK 30 tablet 2   pseudoephedrine-acetaminophen (TYLENOL SINUS) 30-500 MG TABS tablet Take 1 tablet by mouth every 4 (four) hours as needed.     No current facility-administered medications for this visit.    PHYSICAL EXAMINATION: ECOG PERFORMANCE STATUS: 2 - Symptomatic, <50% confined to bed  Vitals:   08/25/22 1223  BP: 119/79  Pulse: 77   Resp: 18  Temp: 97.7 F (36.5 C)  SpO2: 99%   Wt Readings from Last 3 Encounters:  08/25/22 130 lb 3.2 oz (59.1 kg)  06/22/22 130 lb 6.4 oz (59.1 kg)  06/06/22 128 lb (58.1 kg)     GENERAL:alert, no distress and comfortable SKIN: skin color normal, no rashes or significant lesions EYES: normal, Conjunctiva are pink and non-injected, sclera clear  NEURO: alert & oriented x 3 with fluent speech  LABORATORY DATA:  I  have reviewed the data as listed    Latest Ref Rng & Units 08/25/2022   11:57 AM 06/22/2022   11:43 AM 05/19/2022   12:52 PM  CBC  WBC 4.0 - 10.5 K/uL 3.9  5.0  5.1   Hemoglobin 12.0 - 15.0 g/dL 11.5  11.6  11.9   Hematocrit 36.0 - 46.0 % 34.4  34.6  35.8   Platelets 150 - 400 K/uL 149  152  159         Latest Ref Rng & Units 08/25/2022   11:57 AM 06/22/2022   11:43 AM 05/19/2022   12:52 PM  CMP  Glucose 70 - 99 mg/dL 108  84  79   BUN 8 - 23 mg/dL _0 Creatinine 0.44 - 1.00 mg/dL 1.25  1.15  1.16   Sodium 135 - 145 mmol/L 139  138  138   Potassium 3.5 - 5.1 mmol/L 4.1  4.3  4.0   Chloride 98 - 111 mmol/L 105  104  105   CO2 22 - 32 mmol/L _1 Calcium 8.9 - 10.3 mg/dL 9.2  9.5  9.2   Total Protein 6.5 - 8.1 g/dL 6.7  6.4  6.3   Total Bilirubin 0.3 - 1.2 mg/dL 0.5  0.3  0.6   Alkaline Phos 38 - 126 U/L 74  89  68   AST 15 - 41 U/L _2 ALT 0 - 44 U/L _3 RADIOGRAPHIC STUDIES: I have personally reviewed the radiological images as listed and agreed with the findings in the report. No results found.    No orders of the defined types were placed in this encounter.  All questions were answered. The patient knows to call the clinic with any problems, questions or concerns. No barriers to learning was detected. The total time spent in the appointment was 25 minutes.     Truitt Merle, MD 08/25/2022   I, Wilburn Mylar, am acting as scribe for Truitt Merle, MD.   I have reviewed the above documentation for accuracy  and completeness, and I agree with the above.

## 2022-08-30 ENCOUNTER — Other Ambulatory Visit (HOSPITAL_COMMUNITY): Payer: Self-pay

## 2022-08-31 DIAGNOSIS — M25562 Pain in left knee: Secondary | ICD-10-CM | POA: Diagnosis not present

## 2022-08-31 DIAGNOSIS — M1711 Unilateral primary osteoarthritis, right knee: Secondary | ICD-10-CM | POA: Diagnosis not present

## 2022-08-31 DIAGNOSIS — M170 Bilateral primary osteoarthritis of knee: Secondary | ICD-10-CM | POA: Diagnosis not present

## 2022-08-31 DIAGNOSIS — M1710 Unilateral primary osteoarthritis, unspecified knee: Secondary | ICD-10-CM | POA: Diagnosis not present

## 2022-08-31 DIAGNOSIS — M25561 Pain in right knee: Secondary | ICD-10-CM | POA: Diagnosis not present

## 2022-08-31 DIAGNOSIS — M1712 Unilateral primary osteoarthritis, left knee: Secondary | ICD-10-CM | POA: Diagnosis not present

## 2022-09-03 DIAGNOSIS — R051 Acute cough: Secondary | ICD-10-CM | POA: Diagnosis not present

## 2022-09-06 ENCOUNTER — Other Ambulatory Visit: Payer: Self-pay | Admitting: Physician Assistant

## 2022-09-06 ENCOUNTER — Ambulatory Visit
Admission: RE | Admit: 2022-09-06 | Discharge: 2022-09-06 | Disposition: A | Discharge status: Home / Self Care | Payer: PPO | Source: Ambulatory Visit | Attending: Physician Assistant | Admitting: Physician Assistant

## 2022-09-06 DIAGNOSIS — R051 Acute cough: Secondary | ICD-10-CM

## 2022-09-06 DIAGNOSIS — R350 Frequency of micturition: Secondary | ICD-10-CM | POA: Diagnosis not present

## 2022-09-06 DIAGNOSIS — R5383 Other fatigue: Secondary | ICD-10-CM | POA: Diagnosis not present

## 2022-09-06 DIAGNOSIS — R110 Nausea: Secondary | ICD-10-CM | POA: Diagnosis not present

## 2022-09-06 DIAGNOSIS — R059 Cough, unspecified: Secondary | ICD-10-CM | POA: Diagnosis not present

## 2022-09-06 DIAGNOSIS — R0602 Shortness of breath: Secondary | ICD-10-CM | POA: Diagnosis not present

## 2022-09-13 DIAGNOSIS — R7989 Other specified abnormal findings of blood chemistry: Secondary | ICD-10-CM | POA: Diagnosis not present

## 2022-09-15 DIAGNOSIS — M25571 Pain in right ankle and joints of right foot: Secondary | ICD-10-CM | POA: Diagnosis not present

## 2022-09-24 ENCOUNTER — Other Ambulatory Visit: Payer: Self-pay | Admitting: Cardiovascular Disease

## 2022-09-26 ENCOUNTER — Other Ambulatory Visit (HOSPITAL_COMMUNITY): Payer: Self-pay | Admitting: Family Medicine

## 2022-09-26 DIAGNOSIS — M79604 Pain in right leg: Secondary | ICD-10-CM

## 2022-09-26 DIAGNOSIS — M79662 Pain in left lower leg: Secondary | ICD-10-CM | POA: Diagnosis not present

## 2022-09-26 DIAGNOSIS — M79605 Pain in left leg: Secondary | ICD-10-CM | POA: Diagnosis not present

## 2022-09-26 DIAGNOSIS — M79661 Pain in right lower leg: Secondary | ICD-10-CM | POA: Diagnosis not present

## 2022-09-26 DIAGNOSIS — M25571 Pain in right ankle and joints of right foot: Secondary | ICD-10-CM | POA: Diagnosis not present

## 2022-09-27 ENCOUNTER — Ambulatory Visit (HOSPITAL_COMMUNITY)
Admission: RE | Admit: 2022-09-27 | Discharge: 2022-09-27 | Disposition: A | Discharge status: Home / Self Care | Payer: PPO | Source: Ambulatory Visit | Attending: Internal Medicine | Admitting: Internal Medicine

## 2022-09-27 ENCOUNTER — Ambulatory Visit (HOSPITAL_BASED_OUTPATIENT_CLINIC_OR_DEPARTMENT_OTHER)
Admission: RE | Admit: 2022-09-27 | Discharge: 2022-09-27 | Disposition: A | Discharge status: Home / Self Care | Payer: PPO | Source: Ambulatory Visit

## 2022-09-27 ENCOUNTER — Other Ambulatory Visit (HOSPITAL_COMMUNITY): Payer: Self-pay

## 2022-09-27 VITALS — BP 135/78 | HR 77 | Ht 61.0 in | Wt 130.1 lb

## 2022-09-27 DIAGNOSIS — M79604 Pain in right leg: Secondary | ICD-10-CM

## 2022-09-27 DIAGNOSIS — I82431 Acute embolism and thrombosis of right popliteal vein: Secondary | ICD-10-CM

## 2022-09-27 DIAGNOSIS — M7989 Other specified soft tissue disorders: Secondary | ICD-10-CM | POA: Insufficient documentation

## 2022-09-27 MED ORDER — APIXABAN 5 MG PO TABS: 5.0000 mg | ORAL_TABLET | Freq: Two times a day (BID) | ORAL | 5 refills | Status: DC

## 2022-09-27 MED ORDER — APIXABAN (ELIQUIS) VTE STARTER PACK (10MG AND 5MG)
ORAL_TABLET | ORAL | 0 refills | Status: DC
  Filled 2022-09-27: qty 74, 30d supply, fill #0

## 2022-09-27 NOTE — Patient Instructions (Addendum)
-  Start apixaban (Eliquis) 10 mg twice daily for 7 days followed by 5 mg twice daily. -Your refills have been sent to your [facility]. You will likely need to call the pharmacy to ask them to fill this when you start to run low on your current supply.  -It is important to take your medication around the same time every day.  -Avoid NSAIDs like ibuprofen (Advil, Motrin) and naproxen (Aleve) as well as aspirin doses over 100 mg daily. -Tylenol (acetaminophen) is the preferred over the counter pain medication to lower the risk of bleeding. -Be sure to alert all of your health care providers that you are taking an anticoagulant prior to starting a new medication or having a procedure. -Monitor for signs and symptoms of bleeding (abnormal bruising, prolonged bleeding, nose bleeds, bleeding from gums, discolored urine, black tarry stools). If you have fallen and hit your head OR if your bleeding is severe or not stopping, seek emergency care.  -Go to the emergency room if emergent signs and symptoms of new clot occur (new or worse swelling and pain in an arm or leg, shortness of breath, chest pain, fast or irregular heartbeats, lightheadedness, dizziness, fainting, coughing up blood) or if you experience a significant color change (pale or blue) in the extremity that has the DVT.  -We recommend you wear thigh high compression stockings (20-30 mmHg) as long as you are having swelling or pain. Be sure to purchase the correct size and take them off at night. Ankle 9 inch, calf 14 inch, thigh 19 inch  Please call to make your next appointment for the beginning of January. Call [phone] and ask to speak with [facility].  [facility] [place], [place],  [address] Enter the hospital through Entrance C and pull up to the [place] entrance to the free [place] parking.  Check in for your appointment at the [facility].   If you have any questions or need  to reschedule an appointment, please call [phone].  If you are having an emergency, call 911 or present to the nearest emergency room.   What is a DVT?  -Deep vein thrombosis (DVT) is a condition in which a blood clot forms in a vein of the deep venous system which can occur in the lower leg, thigh, pelvis, arm, or neck. This condition is serious and can be life-threatening if the clot travels to the arteries of the lungs and causing a blockage (pulmonary embolism, PE). A DVT can also damage veins in the leg, which can lead to long-term venous disease, leg pain, swelling, discoloration, and ulcers or sores (post-thrombotic syndrome).  -Treatment may include taking an anticoagulant medication to prevent more clots from forming and the current clot from growing, wearing compression stockings, and/or surgical procedures to remove or dissolve the clot.

## 2022-09-27 NOTE — Progress Notes (Signed)
Right lower extremity venous duplex has been completed. Preliminary results can be found in CV Proc through chart review.  Results were given to [facility] at Dr. Arlana Pouch office.  09/27/22 10:00 AM Carlos Levering RVT

## 2022-09-27 NOTE — Progress Notes (Signed)
DVT Clinic Note  Name: Gloria [name]     MRN: [id number]     DOB: April 22, 1928     Sex: female  PCP: Charlane Ferretti, MD  Today's Visit: Visit Information: Initial Visit  Referred to DVT Clinic by: Dr. Garlan Fillers (ortho) Referred to CPP by: Dr. Trula Slade Reason for referral:  Chief Complaint  Patient presents with   DVT   HISTORY OF PRESENT ILLNESS:  TIMIYAH ROMITO is a 88 y.o. female who presents after diagnosis of DVT for medication management. Arrives in a wheelchair and accompanied by her niece, Jeani Hawking. Patient was diagnosed with breast cancer in 2011 which has now metastasized and reports she is not currently taking medication for. Per review of last oncology visit on 08/25/22, plan is to proceed with disease monitoring alone due to advanced age and patient experiencing nausea on previous treatment. Patient reports she has been experiencing pain in her right leg for about two weeks.   Positive Thrombotic Risk Factors: Active cancer, Older Age Bleeding Risk Factors: Age >65 years, Cancer  Negative Thrombotic Risk Factors: Previous VTE, Recent surgery (within 3 months), Recent trauma (within 3 months), Recent admission to hospital with acute illness (within 3 months), Paralysis, paresis, or recent plaster cast immobilization of lower extremity, Central venous catheterization, Bed rest >72 hours within 3 months, Pregnancy, Testosterone therapy, Estrogen therapy, Recent cesarean section (within 3 months), Within 6 weeks postpartum, Erythropoiesis-stimulating agent, Recent COVID diagnosis (within 3 months), Sedentary journey lasting >8 hours within 4 weeks, Known thrombophilic condition, Non-malignant, chronic inflammatory condition, Obesity, Smoking  Rx Insurance Coverage: Medicare Rx Affordability: 30 day supply of Eliquis is $45 which patient states is affordable for her. It does not appear her insurance plan has a deductible. Patient says she is keeping the same plan for next year. Provided patient  with 30 day free trial card which can be used once per lifetime in the event she does have a deductible, making her copay in January is expensive.  Preferred Pharmacy:  Filled starter pack at [facility]. Refills sent to patient's preferred [facility].   Past Medical History:  Diagnosis Date   Anxiety    Blood in urine    CAD (coronary artery disease)    non obstructive by cath   Cancer [facility])    breast- left   COVID    Depression    Family history of breast cancer    Family history of colon cancer    Family history of ovarian cancer    Family history of pancreatic cancer    Family history of prostate cancer    GERD (gastroesophageal reflux disease)    Hypertension    Knee fracture    Personal history of radiation therapy 2017   TR (tricuspid regurgitation)    mild by Echo 12/2008 EF >55%    Past Surgical History:  Procedure Laterality Date   ABDOMINAL AORTAGRAM N/A 09/08/2011   Procedure: ABDOMINAL Maxcine Ham;  Surgeon: Troy Sine, MD;  Location: [facility] CATH LAB;  Service: Cardiovascular;  Laterality: N/A;   ABDOMINAL HYSTERECTOMY     BREAST EXCISIONAL BIOPSY Left 2017   X3   BREAST SURGERY  08-30-10   mastectomy   CARDIAC CATHETERIZATION  08/2011   20% LAD stenosis, 20% diagonal stenosis, 10-20% proximal dominant RCA stenosis   COLON SURGERY     LEFT HEART CATHETERIZATION WITH CORONARY ANGIOGRAM N/A 09/08/2011   Procedure: LEFT HEART CATHETERIZATION WITH CORONARY ANGIOGRAM;  Surgeon: Troy Sine, MD;  Location: [place] CATH LAB;  Service: Cardiovascular;  Laterality: N/A;   MASTECTOMY     MINOR BREAST BIOPSY Left 11/25/2015   Procedure: MINOR EXCISION MASS LEFT CHEST WALL;  Surgeon: Autumn Messing III, MD;  Location: [place];  Service: General;  Laterality: Left;   MINOR BREAST BIOPSY Left 07/17/2016   Procedure: EXCISION OF 2 LEFT CHEST WALL MASSES;  Surgeon: Autumn Messing III, MD;  Location: [place];  Service: General;   Laterality: Left;   TONSILLECTOMY      Social History   Socioeconomic History   Marital status: Divorced    Spouse name: Not on file   Number of children: Not on file   Years of education: Not on file   Highest education level: Not on file  Occupational History   Not on file  Tobacco Use   Smoking status: Former    Types: Cigarettes    Quit date: 11/15/1991    Years since quitting: 30.8   Smokeless tobacco: Never  Substance and Sexual Activity   Alcohol use: Yes    Alcohol/week: 2.0 standard drinks of alcohol    Types: 2 Glasses of wine per week    Comment: 1 glass per week   Drug use: No   Sexual activity: Not Currently  Other Topics Concern   Not on file  Social History Narrative   Not on file   Social Determinants of Health   Financial Resource Strain: Not on file  Food Insecurity: Not on file  Transportation Needs: Not on file  Physical Activity: Not on file  Stress: Not on file  Social Connections: Not on file  Intimate Partner Violence: Not on file    Family History  Problem Relation Age of Onset   Prostate cancer Father 79       metastatic   Heart disease Sister    Breast cancer Sister 82   Pancreatic cancer Brother        dx in his 58s   Colon cancer Maternal Grandmother        dx <50   Dementia Mother    Sudden death Brother    Heart attack Brother    Heart attack Brother    Arthritis Sister    Cancer Paternal Uncle        unknown type   Ovarian cancer Niece 73    Allergies as of 09/27/2022 - Review Complete 09/27/2022  Allergen Reaction Noted   Contrast media [iodinated contrast media] Anaphylaxis 09/08/2011   Penicillins Anaphylaxis and Swelling 09/19/2007   Adhesive [tape] Other (See Comments) 09/08/2011   Cephalexin Swelling 09/07/2011   Ciprofloxacin Swelling 06/12/2011   Demerol Nausea And Vomiting 06/12/2011   Meperidine hcl  09/08/2019   Sulfa antibiotics  09/08/2019   Sulfamethoxazole-trimethoprim Itching and Swelling     Quinolones Itching and Rash 02/19/2009    Current Outpatient Medications on File Prior to Encounter  Medication Sig Dispense Refill   acetaminophen (TYLENOL) 500 MG tablet Take 1,000 mg by mouth daily as needed for moderate pain. Reported on 03/10/2016     ALPRAZolam (XANAX) 0.25 MG tablet Take by mouth.     doxepin (SINEQUAN) 10 MG capsule Take 10 mg by mouth at bedtime.     famotidine (PEPCID) 20 MG tablet Take 20 mg by mouth daily.     FLOVENT HFA 44 MCG/ACT inhaler Inhale into the lungs.     fluticasone (FLONASE) 50 MCG/ACT nasal spray Place 1 spray into both nostrils daily as  needed for allergies.      guaiFENesin (MUCINEX) 600 MG 12 hr tablet Take 600 mg by mouth daily as needed for cough or to loosen phlegm. Reported on 03/28/2016     hydrochlorothiazide (MICROZIDE) 12.5 MG capsule TAKE 1 CAPSULE BY MOUTH ONCE DAILY . APPOINTMENT REQUIRED FOR FUTURE REFILLS 30 capsule 0   losartan (COZAAR) 50 MG tablet Take 1 tablet by mouth once daily 90 tablet 0   ondansetron (ZOFRAN) 4 MG tablet TAKE 1 TABLET BY MOUTH EVERY 8 HOURS AS NEEDED FOR NAUSEA OR VOMITING. OK TO TAKE 2 TABLET AS NEEDED IF 1 TAB DOES NOT WORK 30 tablet 2   pseudoephedrine-acetaminophen (TYLENOL SINUS) 30-500 MG TABS tablet Take 1 tablet by mouth every 4 (four) hours as needed.     CALCIUM-MAGNESIUM-VITAMIN D PO Take 1 tablet by mouth 2 (two) times daily. (Patient not taking: Reported on 09/27/2022)     dexamethasone (DECADRON) 0.5 MG/5ML solution Take 10 mLs by mouth in the morning, at noon, in the evening, and at bedtime. Swish for 2 minutes in mouth and then spit. Do not eat or drink for 1 hour after mouth rinse. (Patient not taking: Reported on 09/27/2022) 500 mL 4   everolimus (AFINITOR) 5 MG tablet Take 1 tablet (5 mg total) by mouth daily. (Patient not taking: Reported on 09/27/2022) 30 tablet 0   exemestane (AROMASIN) 25 MG tablet Take 1 tablet by mouth daily after breakfast. (Patient not taking: Reported on 09/27/2022) 30  tablet 2   nitroGLYCERIN (NITROSTAT) 0.4 MG SL tablet Place 1 tablet (0.4 mg total) under the tongue every 5 (five) minutes as needed for chest pain. <PLEASE MAKE APPOINTMENT> 25 tablet 3   No current facility-administered medications on file prior to encounter.   REVIEW OF SYSTEMS:  Review of Systems  Respiratory:  Negative for shortness of breath.   Cardiovascular:  Positive for leg swelling. Negative for chest pain and palpitations.  Gastrointestinal:  Positive for nausea (reports this is because she has not taken her medications yet today).  Musculoskeletal:        Moderate pain and heaviness in RLE  Neurological:  Negative for dizziness.   PHYSICAL EXAMINATION:  Vitals:   09/27/22 1229  BP: 135/78  Pulse: 77  SpO2: 100%  Weight: 130 lb 1.6 oz (59 kg)  Height: '5\' 1"'$  (1.549 m)    Body mass index is 24.58 kg/m.  Physical Exam Vitals reviewed.  Cardiovascular:     Rate and Rhythm: Normal rate.  Pulmonary:     Effort: Pulmonary effort is normal.  Musculoskeletal:     Right lower leg: Edema present.   Villalta Score for Post-Thrombotic Syndrome: Pain: Moderate Cramps: Absent Heaviness: Moderate Paresthesia: Absent Pruritus: Absent Pretibial Edema: Severe Skin Induration: Absent Hyperpigmentation: Absent Redness: Mild Venous Ectasia: Absent Pain on calf compression: Absent Villalta Preliminary Score: 8 Is venous ulcer present?: No If venous ulcer is present and score is <15, then 15 points total are assigned: Absent Villalta Total Score: 8  LABS:  CBC     Component Value Date/Time   WBC 3.9 (L) 08/25/2022 1157   WBC 2.0 (L) 01/20/2022 1243   RBC 3.56 (L) 08/25/2022 1157   HGB 11.5 (L) 08/25/2022 1157   HGB 12.9 09/24/2017 1126   HCT 34.4 (L) 08/25/2022 1157   HCT 39.6 09/24/2017 1126   PLT 149 (L) 08/25/2022 1157   PLT 133 (L) 09/24/2017 1126   MCV 96.6 08/25/2022 1157   MCV 95.2 09/24/2017 1126  MCH 32.3 08/25/2022 1157   MCHC 33.4 08/25/2022 1157    RDW 13.4 08/25/2022 1157   RDW 13.2 09/24/2017 1126   LYMPHSABS 0.7 08/25/2022 1157   LYMPHSABS 0.7 (L) 09/24/2017 1126   MONOABS 0.4 08/25/2022 1157   MONOABS 0.2 09/24/2017 1126   EOSABS 0.3 08/25/2022 1157   EOSABS 0.3 09/24/2017 1126   BASOSABS 0.0 08/25/2022 1157   BASOSABS 0.0 09/24/2017 1126    Hepatic Function      Component Value Date/Time   PROT 6.7 08/25/2022 1157   PROT 6.2 (L) 09/24/2017 1126   ALBUMIN 3.9 08/25/2022 1157   ALBUMIN 3.6 09/24/2017 1126   AST 25 08/25/2022 1157   AST 18 09/24/2017 1126   ALT 15 08/25/2022 1157   ALT 13 09/24/2017 1126   ALKPHOS 74 08/25/2022 1157   ALKPHOS 57 09/24/2017 1126   BILITOT 0.5 08/25/2022 1157   BILITOT 0.44 09/24/2017 1126   BILIDIR 0.2 11/19/2007 1030   IBILI 0.5 11/19/2007 1030    Renal Function   Lab Results  Component Value Date   CREATININE 1.25 (H) 08/25/2022   CREATININE 1.15 (H) 06/22/2022   CREATININE 1.16 (H) 05/19/2022    CrCl cannot be calculated (Patient's most recent lab result is older than the maximum 21 days allowed.).   [facility] Vascular Lab Studies:  09/27/22 VAS [place] LOWER EXTREMITY VENOUS (DVT) RIGHT  Summary:  RIGHT:  - Findings consistent with acute deep vein thrombosis involving the right  popliteal vein, right posterior tibial veins, and right peroneal veins.  - No cystic structure found in the popliteal fossa.    LEFT:  - No evidence of common femoral vein obstruction.   ASSESSMENT: Location of DVT: Right popliteal vein, Right distal vein Cause of DVT: provoked by a persistent risk factor - active cancer  PLAN: -Start apixaban (Eliquis) 10 mg twice daily for 7 days followed by 5 mg twice daily. -Expected duration of therapy: Guidelines recommend treatment while active cancer present. Will defer decision of duration to shared decision making between the patient and her oncologist. Therapy started on 09/27/22. -Patient educated on purpose, proper use and potential adverse effects of  apixaban (Eliquis). -Discussed importance of taking medication around the same time every day. -Advised patient of medications to avoid (NSAIDs, aspirin doses >100 mg daily). -Educated that Tylenol (acetaminophen) is the preferred analgesic to lower the risk of bleeding. -Advised patient to alert all providers of anticoagulation therapy prior to starting a new medication or having a procedure. -Emphasized importance of monitoring for signs and symptoms of bleeding (abnormal bruising, prolonged bleeding, nose bleeds, bleeding from gums, discolored urine, black tarry stools). -Educated patient to present to the ED if emergent signs and symptoms of new thrombosis occur. -Measured patient for compression stockings. -Counseled patient to wear compression stockings daily, removing at night.  Follow up: Patient's daughter will call to schedule her 1 month follow up in DVT Clinic. Patient has follow up scheduled with her oncologist in January.   Rebbeca Paul, PharmD, Para [place], CPP Deep [facility]in Thrombosis Clinic Clinical Pharmacist Practitioner Office: [phone]

## 2022-09-30 ENCOUNTER — Other Ambulatory Visit: Payer: Self-pay

## 2022-09-30 ENCOUNTER — Encounter (HOSPITAL_COMMUNITY): Payer: Self-pay

## 2022-09-30 ENCOUNTER — Emergency Department (HOSPITAL_COMMUNITY)
Admission: EM | Admit: 2022-09-30 | Discharge: 2022-09-30 | Disposition: A | Discharge status: Home / Self Care | Payer: PPO | Attending: Emergency Medicine | Admitting: Emergency Medicine

## 2022-09-30 DIAGNOSIS — K644 Residual hemorrhoidal skin tags: Secondary | ICD-10-CM | POA: Insufficient documentation

## 2022-09-30 DIAGNOSIS — Z7901 Long term (current) use of anticoagulants: Secondary | ICD-10-CM | POA: Insufficient documentation

## 2022-09-30 DIAGNOSIS — K921 Melena: Secondary | ICD-10-CM | POA: Diagnosis not present

## 2022-09-30 DIAGNOSIS — I2510 Atherosclerotic heart disease of native coronary artery without angina pectoris: Secondary | ICD-10-CM | POA: Insufficient documentation

## 2022-09-30 DIAGNOSIS — R195 Other fecal abnormalities: Secondary | ICD-10-CM

## 2022-09-30 DIAGNOSIS — Z853 Personal history of malignant neoplasm of breast: Secondary | ICD-10-CM | POA: Insufficient documentation

## 2022-09-30 DIAGNOSIS — I10 Essential (primary) hypertension: Secondary | ICD-10-CM | POA: Insufficient documentation

## 2022-09-30 LAB — TYPE AND SCREEN
ABO/RH(D): O NEG
Antibody Screen: NEGATIVE

## 2022-09-30 LAB — POC OCCULT BLOOD, ED: Fecal Occult Bld: NEGATIVE

## 2022-09-30 LAB — CBC
HCT: 33.9 % — ABNORMAL LOW (ref 36.0–46.0)
Hemoglobin: 11 g/dL — ABNORMAL LOW (ref 12.0–15.0)
MCH: 32 pg (ref 26.0–34.0)
MCHC: 32.4 g/dL (ref 30.0–36.0)
MCV: 98.5 fL (ref 80.0–100.0)
Platelets: 184 10*3/uL (ref 150–400)
RBC: 3.44 MIL/uL — ABNORMAL LOW (ref 3.87–5.11)
RDW: 13.9 % (ref 11.5–15.5)
WBC: 2.9 10*3/uL — ABNORMAL LOW (ref 4.0–10.5)
nRBC: 0 % (ref 0.0–0.2)

## 2022-09-30 LAB — COMPREHENSIVE METABOLIC PANEL
ALT: 18 U/L (ref 0–44)
AST: 29 U/L (ref 15–41)
Albumin: 3.3 g/dL — ABNORMAL LOW (ref 3.5–5.0)
Alkaline Phosphatase: 72 U/L (ref 38–126)
Anion gap: 10 (ref 5–15)
BUN: 21 mg/dL (ref 8–23)
CO2: 23 mmol/L (ref 22–32)
Calcium: 9.1 mg/dL (ref 8.9–10.3)
Chloride: 105 mmol/L (ref 98–111)
Creatinine, Ser: 1.1 mg/dL — ABNORMAL HIGH (ref 0.44–1.00)
GFR, Estimated: 47 mL/min — ABNORMAL LOW (ref >60)
Glucose, Bld: 95 mg/dL (ref 70–99)
Potassium: 3.7 mmol/L (ref 3.5–5.1)
Sodium: 138 mmol/L (ref 135–145)
Total Bilirubin: 0.3 mg/dL (ref 0.3–1.2)
Total Protein: 5.7 g/dL — ABNORMAL LOW (ref 6.5–8.1)

## 2022-09-30 NOTE — ED Triage Notes (Signed)
Pt arrived POV from home c/o having a black tarry stool. Pt denies any pain. Pt states she was just started on Eliquis 3 days ago for a blood clot.

## 2022-09-30 NOTE — Discharge Instructions (Addendum)
You came in after an episode of a dark stool last night. Testing so far has been reassuring and we are not concerned for any significant bleeding. Your may resume your Eliquis. Please schedule a follow up appointment with your primary care provider this week for close monitoring.  Contact a doctor if: Your symptoms do not get better with treatment. Get help right away if: Your bleeding does not stop. You feel dizzy or you pass out (faint). You feel weak. You have very bad cramps in your back or belly. You pass large clumps of blood (clots) in your poop. Your symptoms are getting worse. You have chest pain or fast heartbeats. These symptoms may be an emergency. Get help right away. Call 911. Do not wait to see if the symptoms will go away. Do not drive yourself to the hospital.

## 2022-09-30 NOTE — ED Notes (Signed)
Discharge instructions reviewed with patient. Patient denies any questions or concerns. Patient out to lobby with family via wheelchair.

## 2022-09-30 NOTE — ED Provider Triage Note (Signed)
Emergency Medicine Provider Triage Evaluation Note  Gloria [name] , a 88 y.o. female  was evaluated in triage.  Pt complains of black tarry diarrhea since last night. Just started eliquis 3 days ago after DVT in right popliteal/posterior tibial/peroneal veins. After loose stool last night took one dose of immodium.   Review of Systems  Positive: Black stool, diarrhea Negative: Rectal pain, abd pain  Physical Exam  BP 135/77 (BP Location: Right Arm)   Pulse 83   Temp 98.1 F (36.7 C)   Resp 18   Ht 5' (1.524 m)   Wt 57.6 kg   SpO2 98%   BMI 24.80 kg/m  Gen:   Awake, no distress   Resp:  Normal effort  MSK:   Moves extremities without difficulty  Other:    Medical Decision Making  Medically screening exam initiated at 9:29 AM.  Appropriate orders placed.  WALTER MIN was informed that the remainder of the evaluation will be completed by another provider, this initial triage assessment does not replace that evaluation, and the importance of remaining in the ED until their evaluation is complete.  Workup initiated per GI bleed protocol. Last recorded hemoglobin 11.5 on 10/27   Littleton Haub T, PA-C 09/30/22 1031

## 2022-09-30 NOTE — ED Provider Notes (Signed)
[facility] EMERGENCY DEPARTMENT Provider Note   CSN: [id number] Arrival date & time: 09/30/22  4496     History  Chief Complaint  Patient presents with   Melena    Gloria [name] is a 88 y.o. female. Past Medical History:  Diagnosis Date   Anxiety    Blood in urine    CAD (coronary artery disease)    non obstructive by cath   Cancer [facility])    breast- left   COVID    Depression    Family history of breast cancer    Family history of colon cancer    Family history of ovarian cancer    Family history of pancreatic cancer    Family history of prostate cancer    GERD (gastroesophageal reflux disease)    Hypertension    Knee fracture    Personal history of radiation therapy 2017   TR (tricuspid regurgitation)    mild by Echo 12/2008 EF >55%    HPI Patient endorses black tarry stool in the setting of starting Eliquis 3 days ago for blood clot. She denies any pain. Happened last night. Went on for 3 hours. Was watery. Took imodium and it stopped. Whole stool was black with some black specks mixed in. No red blood or clots. Also reports pain in the arch of her right foot. Denies abdominal pain, nausea, vomiting, fever or chills. Took 7 colace. Hasn't had bowel movement since dark stool around midnight. History of diverticulitis. History of hemorrhoids. Endorses some currently.     Home Medications Prior to Admission medications   Medication Sig Start Date End Date Taking? Authorizing Provider  acetaminophen (TYLENOL) 500 MG tablet Take 1,000 mg by mouth daily as needed for moderate pain. Reported on 03/10/2016    [provider]  ALPRAZolam Duanne Moron) 0.25 MG tablet Take by mouth.    [provider]  apixaban (ELIQUIS) 5 MG TABS tablet Take 1 tablet (5 mg total) by mouth 2 (two) times daily. Start taking after completion of starter pack. 09/27/22   Rebbeca Paul B, RPH-CPP  APIXABAN Arne [place]) VTE STARTER PACK ('10MG'$  AND '5MG'$ ) Take as directed on  package: start with two-'5mg'$  tablets twice daily for 7 days. On day 8, switch to one-'5mg'$  tablet twice daily. 09/27/22   Serafina Mitchell, MD  CALCIUM-MAGNESIUM-VITAMIN D PO Take 1 tablet by mouth 2 (two) times daily. Patient not taking: Reported on 09/27/2022    [provider]  dexamethasone (DECADRON) 0.5 MG/5ML solution Take 10 mLs by mouth in the morning, at noon, in the evening, and at bedtime. Swish for 2 minutes in mouth and then spit. Do not eat or drink for 1 hour after mouth rinse. Patient not taking: Reported on 09/27/2022 06/02/22   Truitt Merle, MD  doxepin (SINEQUAN) 10 MG capsule Take 10 mg by mouth at bedtime.    [provider]  everolimus (AFINITOR) 5 MG tablet Take 1 tablet (5 mg total) by mouth daily. Patient not taking: Reported on 09/27/2022 06/14/22   Truitt Merle, MD  exemestane (AROMASIN) 25 MG tablet Take 1 tablet by mouth daily after breakfast. Patient not taking: Reported on 09/27/2022 05/19/22   Truitt Merle, MD  famotidine (PEPCID) 20 MG tablet Take 20 mg by mouth daily.    [provider]  FLOVENT HFA 44 MCG/ACT inhaler Inhale into the lungs. 11/20/20   [provider]  fluticasone (FLONASE) 50 MCG/ACT nasal spray Place 1 spray into both nostrils daily as needed for  allergies.     [provider]  guaiFENesin (MUCINEX) 600 MG 12 hr tablet Take 600 mg by mouth daily as needed for cough or to loosen phlegm. Reported on 03/28/2016    [provider]  hydrochlorothiazide (MICROZIDE) 12.5 MG capsule TAKE 1 CAPSULE BY MOUTH ONCE DAILY . APPOINTMENT REQUIRED FOR FUTURE REFILLS 09/26/22   Troy Sine, MD  losartan (COZAAR) 50 MG tablet Take 1 tablet by mouth once daily 08/07/22   Troy Sine, MD  nitroGLYCERIN (NITROSTAT) 0.4 MG SL tablet Place 1 tablet (0.4 mg total) under the tongue every 5 (five) minutes as needed for chest pain. <PLEASE MAKE APPOINTMENT> 02/25/16   Troy Sine, MD  ondansetron (ZOFRAN) 4 MG tablet TAKE 1  TABLET BY MOUTH EVERY 8 HOURS AS NEEDED FOR NAUSEA OR VOMITING. OK TO TAKE 2 TABLET AS NEEDED IF 1 TAB DOES NOT WORK 08/25/22   Truitt Merle, MD  pseudoephedrine-acetaminophen (TYLENOL SINUS) 30-500 MG TABS tablet Take 1 tablet by mouth every 4 (four) hours as needed.    [provider]      Allergies    Contrast media [iodinated contrast media], Penicillins, Adhesive [tape], Cephalexin, Ciprofloxacin, Demerol, Meperidine hcl, Sulfa antibiotics, Sulfamethoxazole-trimethoprim, and Quinolones    Review of Systems   Review of Systems  Physical Exam Updated Vital Signs BP 129/71   Pulse 71   Temp 98.1 F (36.7 C)   Resp 18   Ht 5' (1.524 m)   Wt 57.6 kg   SpO2 97%   BMI 24.80 kg/m  Physical Exam Constitutional:      General: She is not in acute distress. HENT:     Head: Normocephalic and atraumatic.  Eyes:     Extraocular Movements: Extraocular movements intact.  Cardiovascular:     Rate and Rhythm: Normal rate and regular rhythm.  Pulmonary:     Effort: Pulmonary effort is normal.     Breath sounds: No wheezing, rhonchi or rales.  Abdominal:     General: There is no distension.     Palpations: Abdomen is soft.     Tenderness: There is no abdominal tenderness.  Genitourinary:    Comments: Nonbleeding external hemorroids. No blood visualized on DRE. No masses or defects appreciated internally. Musculoskeletal:     Comments: Right lower extremity with edema and warmth. Left lower extremity without warmth, erythema, swelling or tenderness. Arch of left foot with some tenderness for full range of motion, intact sensation and strength at ankle and toes.  Skin:    General: Skin is warm and dry.  Neurological:     General: No focal deficit present.     Mental Status: She is alert and oriented to person, place, and time.  Psychiatric:        Mood and Affect: Mood normal.        Behavior: Behavior normal.     ED Results / Procedures / Treatments   Labs (all labs  ordered are listed, but only abnormal results are displayed) Labs Reviewed  COMPREHENSIVE METABOLIC PANEL - Abnormal; Notable for the following components:      Result Value   Creatinine, Ser 1.10 (*)    Total Protein 5.7 (*)    Albumin 3.3 (*)    GFR, Estimated 47 (*)    All other components within normal limits  CBC - Abnormal; Notable for the following components:   WBC 2.9 (*)    RBC 3.44 (*)    Hemoglobin 11.0 (*)  HCT 33.9 (*)    All other components within normal limits  POC OCCULT BLOOD, ED  POC OCCULT BLOOD, ED  TYPE AND SCREEN    EKG None  Radiology No results found.  Procedures Procedures    Medications Ordered in ED Medications - No data to display  ED Course/ Medical Decision Making/ A&P                           Medical Decision Making  Patient presents with history of one black bowel movement after starting Eliquis 3 days ago. Denies other symptoms including frank blood in stool, abdominal pain, nausea or vomiting. Hgb stable at 11. Normotensive. Stool guaiac negative. Unsure whether dark stool was in fact melena. Reassured by exam and workup today. Will discharge with return precautions and close f/u with PCP. Instructed to resume Eliquis for right lower extremity DVT.        Final Clinical Impression(s) / ED Diagnoses Final diagnoses:  Dark stools    Rx / DC Orders ED Discharge Orders     None         Linward Natal, MD 09/30/22 1200    Lacretia Leigh, MD 10/01/22 [id number]

## 2022-09-30 NOTE — ED Provider Notes (Signed)
I saw and evaluated the patient, reviewed the resident's note and I agree with the findings and plan.      Presented with dark stools x1 day.  Recent started on Eliquis.  Guaiac is negative for blood here.  Hemoglobin is stable.  No evidence of gastrointestinal hemorrhage.  Discussed with patient and daughter.  Will be discharged home with follow-up instructions   Lacretia Leigh, MD 09/30/22 1151

## 2022-10-02 ENCOUNTER — Telehealth (HOSPITAL_COMMUNITY): Payer: Self-pay | Admitting: Student-PharmD

## 2022-10-02 NOTE — Telephone Encounter (Signed)
Patient's daughter, Tilda Burrow, called over the weekend stating the patient's stool looked like tar after starting Eliquis a few days prior. Per chart review, patient went to the [facility] ED 09/30/22 for dark stools x 1 day. Work up there was reassuring - stool guaiac was negative, Hgb stable, normotensive. They were unsure if dark stool was in fact melena. She was discharged with instructions to continue Eliquis and follow up with PCP.   Called daughter back this morning to follow up on how the patient is doing. Unable to reach. LVM requesting call back if she is having any further issues.

## 2022-10-11 ENCOUNTER — Telehealth (HOSPITAL_COMMUNITY): Payer: Self-pay | Admitting: Student-PharmD

## 2022-10-11 NOTE — Telephone Encounter (Signed)
Patient's daughter, Tilda Burrow, called to ask if Eliquis can cause weight gain. She says her mom has gone up a pant size in the last two weeks since starting it. Explained that it is unlikely the Eliquis is contributing to weight gain and recommended her to schedule an appointment with her PCP for further evaluation. She is not having any SOB, chest pain, palpitations. Asked if she was having any worsening swelling in her legs. She thinks weight gain is around her midsection though she has been having trouble putting on her compression stockings on her own. They purchased 15-20 mmHg thigh high compression stockings. Encouraged them to try knee high stockings to see if that is easier to put on and to confirm they are purchasing the correct size. Scheduled patient for 1 month follow up visit in DVT Clinic on 10/26/22.

## 2022-10-26 ENCOUNTER — Ambulatory Visit (HOSPITAL_COMMUNITY)
Admission: RE | Admit: 2022-10-26 | Discharge: 2022-10-26 | Disposition: A | Discharge status: Home / Self Care | Payer: PPO | Source: Ambulatory Visit | Attending: Surgery | Admitting: Surgery

## 2022-10-26 VITALS — BP 107/66 | HR 76

## 2022-10-26 DIAGNOSIS — I82431 Acute embolism and thrombosis of right popliteal vein: Secondary | ICD-10-CM | POA: Diagnosis not present

## 2022-10-26 NOTE — Patient Instructions (Signed)
-  Continue Eliquis 5 mg (1 tablet) twice daily.  -Your refills have been sent to [facility] on [address]. You will likely need to call the pharmacy to ask them to fill this when you start to run low on your current supply.  -It is important to take your medication around the same time every day.  -Avoid NSAIDs like ibuprofen (Advil, Motrin) and naproxen (Aleve) as well as aspirin doses over 100 mg daily. -Tylenol (acetaminophen) is the preferred over the counter pain medication to lower the risk of bleeding. -Be sure to alert all of your health care providers that you are taking an anticoagulant prior to starting a new medication or having a procedure. -Monitor for signs and symptoms of bleeding (abnormal bruising, prolonged bleeding, nose bleeds, bleeding from gums, discolored urine, black tarry stools). If you have fallen and hit your head OR if your bleeding is severe or not stopping, seek emergency care.  -Go to the emergency room if emergent signs and symptoms of new clot occur (new or worse swelling and pain in an arm or leg, shortness of breath, chest pain, fast or irregular heartbeats, lightheadedness, dizziness, fainting, coughing up blood) or if you experience a significant color change (pale or blue) in the extremity that has the DVT.  -We recommend you wear compression stockings as long as you are having swelling or pain. Be sure to purchase the correct size and take them off at night.   Your next visit is on Wednesday, February 28th, at [facility] [place], [place], [place] [address] Enter the hospital through Entrance C off [facility] and pull up to the [place] entrance to the free [place] parking.  Check in for your appointment at the [facility].   If you have any questions or need to reschedule an appointment, please call [phone].  If you are having an emergency, call 911 or present to the nearest emergency room.    What is a DVT?  -Deep vein thrombosis (DVT) is a condition in which a blood clot forms in a vein of the deep venous system which can occur in the lower leg, thigh, pelvis, arm, or neck. This condition is serious and can be life-threatening if the clot travels to the arteries of the lungs and causing a blockage (pulmonary embolism, PE). A DVT can also damage veins in the leg, which can lead to long-term venous disease, leg pain, swelling, discoloration, and ulcers or sores (post-thrombotic syndrome).  -Treatment may include taking an anticoagulant medication to prevent more clots from forming and the current clot from growing, wearing compression stockings, and/or surgical procedures to remove or dissolve the clot.

## 2022-10-26 NOTE — Progress Notes (Signed)
DVT Clinic Note  Name: Gloria [name]     MRN: [id number]     DOB: May 29, 1928     Sex: female  PCP: Charlane Ferretti, MD  Today's Visit: Visit Information: Follow Up Visit  Referred to DVT Clinic by: Dr. Garlan Fillers (ortho)  Referred to CPP by: Dr. Trula Slade Reason for referral:  Chief Complaint  Patient presents with   Med Management - DVT   HISTORY OF PRESENT ILLNESS:  Gloria [name] is a 88 y.o. female who presents for follow up medication management after diagnosis of acute deep vein thrombosis involving the right popliteal vein, right posterior tibial veins, and right peroneal veins on 09/27/22. Last seen in DVT Clinic 09/27/22 at which time Eliquis was started. She presents in a wheelchair and is accompanied by her daughter, Gloria [name]. Patient was diagnosed with breast cancer in 2011 which has now metastasized and reports she is not currently receiving treatment for. Per review of last oncology visit on 08/25/22, plan is to proceed with disease monitoring alone due to advanced age and patient experiencing nausea on previous treatment.   Today patient reports her pain in the right leg has improved, but she is now having swelling in both of her legs, left worse than right. They initially purchased 20-30 mmHg compression stockings then 15-20 mmHg but both were too tight for her to put on. She is now using 8-15 mmHg stockings which she reports are not very compressive but she is able to put them on independently and is wearing them daily on both legs. She has a follow up appointment with cardiology in January to address the swelling. Reports that in the past when this happened it was because she had missed doses of hydrochlorothiazide but endorses taking it currently. Denies abnormal bleeding or bruising. The patient was concerned about new tarry stools that started after initiation of Eliquis and went to the ED on 09/30/22. Work up there was reassuring - stool guaiac was negative, Hgb stable, normotensive. It  did not appear the dark stools were melena. Patient reports she continued to have dark stools for 1-2 weeks after that but then it resolved. She has labs scheduled with her oncologist in a couple weeks. Reports only 1 missed dose of Eliquis in the last month.   Positive Thrombotic Risk Factors: Active cancer, Older Age Bleeding Risk Factors: Age >65 years, Anticoagulant therapy  Negative Thrombotic Risk Factors: Previous VTE, Recent surgery (within 3 months), Recent trauma (within 3 months), Recent admission to hospital with acute illness (within 3 months), Paralysis, paresis, or recent plaster cast immobilization of lower extremity, Central venous catheterization, Pregnancy, Sedentary journey lasting >8 hours within 4 weeks, Bed rest >72 hours within 3 months, Within 6 weeks postpartum, Recent cesarean section (within 3 months), Estrogen therapy, Testosterone therapy, Recent COVID diagnosis (within 3 months), Erythropoiesis-stimulating agent, Non-malignant, chronic inflammatory condition, Known thrombophilic condition, Smoking, Obesity  Rx Insurance Coverage: Medicare Rx Affordability: 30 day supply of Eliquis is $45 which patient states is still affordable for her. It does not appear her insurance plan has a deductible. Patient says she is keeping the same plan for next year. Provided patient with 30 day free trial card at last visit which can be used once per lifetime in the event she ever needs copay assistance for a month. Preferred Pharmacy: Refills have been sent to the patient's preferred [facility].   Past Medical History:  Diagnosis Date   Anxiety    Blood in urine    CAD (  coronary artery disease)    non obstructive by cath   Cancer [facility])    breast- left   COVID    Depression    Family history of breast cancer    Family history of colon cancer    Family history of ovarian cancer    Family history of pancreatic cancer    Family history of prostate cancer    GERD (gastroesophageal  reflux disease)    Hypertension    Knee fracture    Personal history of radiation therapy 2017   TR (tricuspid regurgitation)    mild by Echo 12/2008 EF >55%    Past Surgical History:  Procedure Laterality Date   ABDOMINAL AORTAGRAM N/A 09/08/2011   Procedure: ABDOMINAL Maxcine Ham;  Surgeon: Troy Sine, MD;  Location: [facility] CATH LAB;  Service: Cardiovascular;  Laterality: N/A;   ABDOMINAL HYSTERECTOMY     BREAST EXCISIONAL BIOPSY Left 2017   X3   BREAST SURGERY  08-30-10   mastectomy   CARDIAC CATHETERIZATION  08/2011   20% LAD stenosis, 20% diagonal stenosis, 10-20% proximal dominant RCA stenosis   COLON SURGERY     LEFT HEART CATHETERIZATION WITH CORONARY ANGIOGRAM N/A 09/08/2011   Procedure: LEFT HEART CATHETERIZATION WITH CORONARY ANGIOGRAM;  Surgeon: Troy Sine, MD;  Location: [facility] CATH LAB;  Service: Cardiovascular;  Laterality: N/A;   MASTECTOMY     MINOR BREAST BIOPSY Left 11/25/2015   Procedure: MINOR EXCISION MASS LEFT CHEST WALL;  Surgeon: Autumn Messing III, MD;  Location: [facility];  Service: General;  Laterality: Left;   MINOR BREAST BIOPSY Left 07/17/2016   Procedure: EXCISION OF 2 LEFT CHEST WALL MASSES;  Surgeon: Autumn Messing III, MD;  Location: [facility];  Service: General;  Laterality: Left;   TONSILLECTOMY      Social History   Socioeconomic History   Marital status: Divorced    Spouse name: Not on file   Number of children: Not on file   Years of education: Not on file   Highest education level: Not on file  Occupational History   Not on file  Tobacco Use   Smoking status: Former    Types: Cigarettes    Quit date: 11/15/1991    Years since quitting: 30.9   Smokeless tobacco: Never  Substance and Sexual Activity   Alcohol use: Yes    Alcohol/week: 2.0 standard drinks of alcohol    Types: 2 Glasses of wine per week    Comment: 1 glass per week   Drug use: No   Sexual activity: Not Currently  Other Topics Concern   Not on file   Social History Narrative   Not on file   Social Determinants of Health   Financial Resource Strain: Not on file  Food Insecurity: Not on file  Transportation Needs: Not on file  Physical Activity: Not on file  Stress: Not on file  Social Connections: Not on file  Intimate Partner Violence: Not on file    Family History  Problem Relation Age of Onset   Prostate cancer Father 64       metastatic   Heart disease Sister    Breast cancer Sister 5   Pancreatic cancer Brother        dx in his 59s   Colon cancer Maternal Grandmother        dx <50   Dementia Mother    Sudden death Brother    Heart attack Brother    Heart attack Brother  Arthritis Sister    Cancer Paternal Uncle        unknown type   Ovarian cancer Niece 64    Allergies as of 10/26/2022 - Review Complete 10/26/2022  Allergen Reaction Noted   Contrast media [iodinated contrast media] Anaphylaxis 09/08/2011   Penicillins Anaphylaxis and Swelling 09/19/2007   Adhesive [tape] Other (See Comments) 09/08/2011   Cephalexin Swelling 09/07/2011   Ciprofloxacin Swelling 06/12/2011   Demerol Nausea And Vomiting 06/12/2011   Meperidine hcl  09/08/2019   Sulfa antibiotics  09/08/2019   Sulfamethoxazole-trimethoprim Itching and Swelling    Quinolones Itching and Rash 02/19/2009    Current Outpatient Medications on File Prior to Encounter  Medication Sig Dispense Refill   acetaminophen (TYLENOL) 500 MG tablet Take 1,000 mg by mouth daily as needed for moderate pain. Reported on 03/10/2016     ALPRAZolam (XANAX) 0.25 MG tablet Take by mouth.     apixaban (ELIQUIS) 5 MG TABS tablet Take 1 tablet (5 mg total) by mouth 2 (two) times daily. Start taking after completion of starter pack. 60 tablet 5   doxepin (SINEQUAN) 10 MG capsule Take 10 mg by mouth at bedtime.     famotidine (PEPCID) 20 MG tablet Take 20 mg by mouth daily.     FLOVENT HFA 44 MCG/ACT inhaler Inhale into the lungs.     fluticasone (FLONASE) 50 MCG/ACT  nasal spray Place 1 spray into both nostrils 2 (two) times daily.     guaiFENesin (MUCINEX) 600 MG 12 hr tablet Take 600 mg by mouth daily as needed for cough or to loosen phlegm. Reported on 03/28/2016     hydrochlorothiazide (MICROZIDE) 12.5 MG capsule TAKE 1 CAPSULE BY MOUTH ONCE DAILY . APPOINTMENT REQUIRED FOR FUTURE REFILLS 30 capsule 0   losartan (COZAAR) 25 MG tablet Take 25 mg by mouth daily. Takes 50 mg in the morning and 25 mg in the evening.     losartan (COZAAR) 50 MG tablet Take 1 tablet by mouth once daily (Patient taking differently: Take 50 mg by mouth daily. Takes 50 mg in the morning and 25 mg in the evening.) 90 tablet 0   ondansetron (ZOFRAN) 4 MG tablet TAKE 1 TABLET BY MOUTH EVERY 8 HOURS AS NEEDED FOR NAUSEA OR VOMITING. OK TO TAKE 2 TABLET AS NEEDED IF 1 TAB DOES NOT WORK 30 tablet 2   pseudoephedrine-acetaminophen (TYLENOL SINUS) 30-500 MG TABS tablet Take 1 tablet by mouth every 4 (four) hours as needed.     nitroGLYCERIN (NITROSTAT) 0.4 MG SL tablet Place 1 tablet (0.4 mg total) under the tongue every 5 (five) minutes as needed for chest pain. <PLEASE MAKE APPOINTMENT> 25 tablet 3   No current facility-administered medications on file prior to encounter.   REVIEW OF SYSTEMS:  Review of Systems  Respiratory:  Negative for shortness of breath.   Cardiovascular:  Positive for leg swelling (bilateral, left worse than right). Negative for chest pain and palpitations.  Gastrointestinal:  Negative for blood in stool and melena.  Musculoskeletal:  Negative for myalgias.  Neurological:  Negative for dizziness and tingling.   PHYSICAL EXAMINATION:  Vitals:   10/26/22 0907  BP: 107/66  Pulse: 76  SpO2: 100%   Physical Exam Vitals reviewed.  Constitutional:      Appearance: She is normal weight.  Cardiovascular:     Rate and Rhythm: Normal rate.  Pulmonary:     Effort: Pulmonary effort is normal.  Musculoskeletal:     Right lower leg: Edema (2+) present.  Left  lower leg: Edema (3+) present.  Neurological:     Mental Status: She is alert.  Psychiatric:        Mood and Affect: Mood normal.        Behavior: Behavior normal.        Thought Content: Thought content normal.   Villalta Score for Post-Thrombotic Syndrome: Pain: Mild Cramps: Mild Heaviness: Absent Paresthesia: Absent Pruritus: Absent Pretibial Edema: Moderate Skin Induration: Absent Hyperpigmentation: Absent Redness: Absent Venous Ectasia: Absent Pain on calf compression: Mild Villalta Preliminary Score: 5 Is venous ulcer present?: No If venous ulcer is present and score is <15, then 15 points total are assigned: Absent Villalta Total Score: 5  LABS:  CBC     Component Value Date/Time   WBC 2.9 (L) 09/30/2022 0940   RBC 3.44 (L) 09/30/2022 0940   HGB 11.0 (L) 09/30/2022 0940   HGB 11.5 (L) 08/25/2022 1157   HGB 12.9 09/24/2017 1126   HCT 33.9 (L) 09/30/2022 0940   HCT 39.6 09/24/2017 1126   PLT 184 09/30/2022 0940   PLT 149 (L) 08/25/2022 1157   PLT 133 (L) 09/24/2017 1126   MCV 98.5 09/30/2022 0940   MCV 95.2 09/24/2017 1126   MCH 32.0 09/30/2022 0940   MCHC 32.4 09/30/2022 0940   RDW 13.9 09/30/2022 0940   RDW 13.2 09/24/2017 1126   LYMPHSABS 0.7 08/25/2022 1157   LYMPHSABS 0.7 (L) 09/24/2017 1126   MONOABS 0.4 08/25/2022 1157   MONOABS 0.2 09/24/2017 1126   EOSABS 0.3 08/25/2022 1157   EOSABS 0.3 09/24/2017 1126   BASOSABS 0.0 08/25/2022 1157   BASOSABS 0.0 09/24/2017 1126    Hepatic Function      Component Value Date/Time   PROT 5.7 (L) 09/30/2022 0940   PROT 6.2 (L) 09/24/2017 1126   ALBUMIN 3.3 (L) 09/30/2022 0940   ALBUMIN 3.6 09/24/2017 1126   AST 29 09/30/2022 0940   AST 25 08/25/2022 1157   AST 18 09/24/2017 1126   ALT 18 09/30/2022 0940   ALT 15 08/25/2022 1157   ALT 13 09/24/2017 1126   ALKPHOS 72 09/30/2022 0940   ALKPHOS 57 09/24/2017 1126   BILITOT 0.3 09/30/2022 0940   BILITOT 0.5 08/25/2022 1157   BILITOT 0.44 09/24/2017 1126    BILIDIR 0.2 11/19/2007 1030   IBILI 0.5 11/19/2007 1030    Renal Function   Lab Results  Component Value Date   CREATININE 1.10 (H) 09/30/2022   CREATININE 1.25 (H) 08/25/2022   CREATININE 1.15 (H) 06/22/2022    CrCl cannot be calculated (Patient's most recent lab result is older than the maximum 21 days allowed.).   VVS Vascular Lab Studies:  09/27/22 VAS [place] LOWER EXTREMITY VENOUS (DVT) RIGHT  Summary:  RIGHT:  - Findings consistent with acute deep vein thrombosis involving the right  popliteal vein, right posterior tibial veins, and right peroneal veins.  - No cystic structure found in the popliteal fossa.    LEFT:  - No evidence of common femoral vein obstruction.   ASSESSMENT: Location of DVT: Right popliteal vein, Right distal vein Cause of DVT: provoked by a persistent risk factor - active metastatic cancer  RLE edema and pain has improved since last visit but is now having LLE edema for which she has follow up with cardiology scheduled in January. Given dark stools have not been present >2 weeks, Hgb was stable during the time of dark stools, and patient has labs scheduled in January with oncology, will defer rechecking CBC today.  PLAN: -Continue apixaban (Eliquis) 5 mg twice daily. -Expected duration of therapy: Guidelines recommend continuation of anticoagulation while persistent risk factor (active cancer) is present. However, given the patient's age and that they are no longer treating her cancer, will defer decision of duration of anticoagulation to shared decision making between the patient and her oncologist [name] whom she follows up with in January. Therapy started on 09/27/22. -Patient educated on purpose, proper use and potential adverse effects of apixaban (Eliquis). -Discussed importance of taking medication around the same time every day. -Advised patient of medications to avoid (NSAIDs, aspirin doses >100 mg daily). -Educated that Tylenol  (acetaminophen) is the preferred analgesic to lower the risk of bleeding. -Advised patient to alert all providers of anticoagulation therapy prior to starting a new medication or having a procedure. -Emphasized importance of monitoring for signs and symptoms of bleeding (abnormal bruising, prolonged bleeding, nose bleeds, bleeding from gums, discolored urine, black tarry stools). -Educated patient to present to the ED if emergent signs and symptoms of new thrombosis occur. -Counseled patient to wear compression stockings daily, removing at night. Encouraged her to start elevating her legs to help with the swelling.   Follow up: Final follow up visit with DVT Clinic in February.   Rebbeca Paul, PharmD, [place], CPP [facility] Clinical Pharmacist Practitioner Office: [phone]

## 2022-10-28 ENCOUNTER — Other Ambulatory Visit: Payer: Self-pay | Admitting: Cardiovascular Disease

## 2022-11-06 DIAGNOSIS — M810 Age-related osteoporosis without current pathological fracture: Secondary | ICD-10-CM | POA: Diagnosis not present

## 2022-11-06 DIAGNOSIS — C50919 Malignant neoplasm of unspecified site of unspecified female breast: Secondary | ICD-10-CM | POA: Diagnosis not present

## 2022-11-06 DIAGNOSIS — F5101 Primary insomnia: Secondary | ICD-10-CM | POA: Diagnosis not present

## 2022-11-06 DIAGNOSIS — R2243 Localized swelling, mass and lump, lower limb, bilateral: Secondary | ICD-10-CM | POA: Diagnosis not present

## 2022-11-06 DIAGNOSIS — J449 Chronic obstructive pulmonary disease, unspecified: Secondary | ICD-10-CM | POA: Diagnosis not present

## 2022-11-06 DIAGNOSIS — R0609 Other forms of dyspnea: Secondary | ICD-10-CM | POA: Diagnosis not present

## 2022-11-06 DIAGNOSIS — R269 Unspecified abnormalities of gait and mobility: Secondary | ICD-10-CM | POA: Diagnosis not present

## 2022-11-06 DIAGNOSIS — F411 Generalized anxiety disorder: Secondary | ICD-10-CM | POA: Diagnosis not present

## 2022-11-06 DIAGNOSIS — I10 Essential (primary) hypertension: Secondary | ICD-10-CM | POA: Diagnosis not present

## 2022-11-08 ENCOUNTER — Other Ambulatory Visit (HOSPITAL_COMMUNITY): Payer: Self-pay

## 2022-11-13 ENCOUNTER — Ambulatory Visit: Payer: PPO | Admitting: Podiatry

## 2022-11-14 ENCOUNTER — Other Ambulatory Visit: Payer: Self-pay | Admitting: Cardiovascular Disease

## 2022-11-16 ENCOUNTER — Other Ambulatory Visit: Payer: Self-pay | Admitting: Hematology

## 2022-11-17 ENCOUNTER — Telehealth (HOSPITAL_COMMUNITY): Payer: Self-pay | Admitting: Student-PharmD

## 2022-11-17 ENCOUNTER — Other Ambulatory Visit (HOSPITAL_COMMUNITY): Payer: Self-pay

## 2022-11-17 NOTE — Telephone Encounter (Signed)
DVT Patient Advocate Encounter  Test claim returns refill too soon until 11/19/22. Spoke to pharmacy to confirm, and pharmacy has updated the daughter. This plan shows a $47 copay for 30 day supply and the pharmacy will fill when scheduled.  Clista Bernhardt, CPhT Rx Patient Advocate Phone: [phone]

## 2022-11-17 NOTE — Telephone Encounter (Signed)
Patient's daughter, Tilda Burrow, called saying their pharmacy is telling them the copay for Eliquis this month is >$700. Unclear if this is a deductible or if the pharmacy is running the patient's insurance incorrectly. At the patient's last visit, I provided them with a $0 savings card that can be used once. The patient's daughter is going to look for this, call the pharmacy, and give me a call back. I've also reached out to [facility] (patient advocate for DVT Clinic) to see if she can help determine what the problem is.

## 2022-11-20 ENCOUNTER — Ambulatory Visit (INDEPENDENT_AMBULATORY_CARE_PROVIDER_SITE_OTHER): Payer: PPO | Admitting: Podiatry

## 2022-11-20 ENCOUNTER — Encounter: Payer: Self-pay | Admitting: Podiatry

## 2022-11-20 VITALS — BP 132/72

## 2022-11-20 DIAGNOSIS — I739 Peripheral vascular disease, unspecified: Secondary | ICD-10-CM

## 2022-11-20 DIAGNOSIS — M79675 Pain in left toe(s): Secondary | ICD-10-CM | POA: Diagnosis not present

## 2022-11-20 DIAGNOSIS — M79674 Pain in right toe(s): Secondary | ICD-10-CM

## 2022-11-20 DIAGNOSIS — B351 Tinea unguium: Secondary | ICD-10-CM | POA: Diagnosis not present

## 2022-11-20 DIAGNOSIS — L600 Ingrowing nail: Secondary | ICD-10-CM

## 2022-11-20 NOTE — Progress Notes (Signed)
  Subjective:  Patient ID: Gloria [name], female    DOB: 07-03-1928,   MRN: [id number]  Chief Complaint  Patient presents with   Nail Problem    Nail trim     89 y.o. female presents for nail trim.   Requesting to have all nails trimmed today as well.  History of PAD and most recent ABIs looking normal. Did recently have a DVT and on eliquis.  Denies any other pedal complaints. Denies n/v/f/c.   Past Medical History:  Diagnosis Date   Anxiety    Blood in urine    CAD (coronary artery disease)    non obstructive by cath   Cancer [facility])    breast- left   COVID    Depression    Family history of breast cancer    Family history of colon cancer    Family history of ovarian cancer    Family history of pancreatic cancer    Family history of prostate cancer    GERD (gastroesophageal reflux disease)    Hypertension    Knee fracture    Personal history of radiation therapy 2017   TR (tricuspid regurgitation)    mild by Echo 12/2008 EF >55%    Objective:  Physical Exam: Vascular: DP/PT pulses 2/4 bilateral. CFT <3 seconds. Absent hair growth on digits. Edema noted to bilateral lower extremities. Xerosis noted bilaterally.  Skin. No lacerations or abrasions bilateral feet. Nails 1-5 bilateral  are thickened discolored and elongated with subungual debris.  Musculoskeletal: MMT 5/5 bilateral lower extremities in DF, PF, Inversion and Eversion. Deceased ROM in DF of ankle joint.  Neurological: Sensation intact to light touch. Protective sensation intact bilateral.     ABI Right: Resting right ankle-brachial index is within normal range. No  evidence of significant right lower extremity arterial disease. The right  toe-brachial index is normal.   Left: The left toe-brachial index is normal.   Unable to obtain ABI due to pain in calf area. However, waveforms are  triphasic and within normal limits.   Assessment:   1. Pain in toes of both feet   2. Dermatophytosis of nail   3. PAD  (peripheral artery disease) ([place])   4. Ingrown toenail       Plan:  Patient was evaluated and treated and all questions answered. -Discussed and educated patient onoot care, especially with  regards to the vascular, neurological and musculoskeletal systems.  -Discussed supportive shoes at all times and checking feet regularly.  -Mechanically debrided all nails 1-5 bilateral using sterile nail nipper and filed with dremel without incident  -Answered all patient questions -Patient to return  in 3 months for at risk foot care -Patient advised to call the office if any problems or questions arise in the meantime.   Lorenda Peck, DPM

## 2022-11-21 ENCOUNTER — Inpatient Hospital Stay (HOSPITAL_COMMUNITY)
Admission: EM | Admit: 2022-11-21 | Discharge: 2022-11-29 | DRG: 853 | Disposition: A | Discharge status: Hospice | Payer: PPO | Attending: Internal Medicine | Admitting: Internal Medicine

## 2022-11-21 ENCOUNTER — Encounter (HOSPITAL_COMMUNITY): Payer: Self-pay

## 2022-11-21 ENCOUNTER — Emergency Department (HOSPITAL_COMMUNITY): Payer: PPO

## 2022-11-21 ENCOUNTER — Other Ambulatory Visit: Payer: Self-pay

## 2022-11-21 DIAGNOSIS — J690 Pneumonitis due to inhalation of food and vomit: Secondary | ICD-10-CM | POA: Diagnosis present

## 2022-11-21 DIAGNOSIS — M7989 Other specified soft tissue disorders: Secondary | ICD-10-CM | POA: Diagnosis not present

## 2022-11-21 DIAGNOSIS — F411 Generalized anxiety disorder: Secondary | ICD-10-CM | POA: Diagnosis not present

## 2022-11-21 DIAGNOSIS — Z7189 Other specified counseling: Secondary | ICD-10-CM | POA: Diagnosis not present

## 2022-11-21 DIAGNOSIS — H9190 Unspecified hearing loss, unspecified ear: Secondary | ICD-10-CM | POA: Diagnosis present

## 2022-11-21 DIAGNOSIS — Z880 Allergy status to penicillin: Secondary | ICD-10-CM

## 2022-11-21 DIAGNOSIS — A419 Sepsis, unspecified organism: Principal | ICD-10-CM

## 2022-11-21 DIAGNOSIS — J439 Emphysema, unspecified: Secondary | ICD-10-CM | POA: Diagnosis not present

## 2022-11-21 DIAGNOSIS — I825Z2 Chronic embolism and thrombosis of unspecified deep veins of left distal lower extremity: Secondary | ICD-10-CM | POA: Diagnosis present

## 2022-11-21 DIAGNOSIS — K8500 Idiopathic acute pancreatitis without necrosis or infection: Secondary | ICD-10-CM | POA: Diagnosis not present

## 2022-11-21 DIAGNOSIS — N1330 Unspecified hydronephrosis: Secondary | ICD-10-CM | POA: Diagnosis not present

## 2022-11-21 DIAGNOSIS — Z515 Encounter for palliative care: Secondary | ICD-10-CM | POA: Diagnosis not present

## 2022-11-21 DIAGNOSIS — N390 Urinary tract infection, site not specified: Secondary | ICD-10-CM | POA: Diagnosis not present

## 2022-11-21 DIAGNOSIS — R945 Abnormal results of liver function studies: Secondary | ICD-10-CM | POA: Diagnosis not present

## 2022-11-21 DIAGNOSIS — Z8042 Family history of malignant neoplasm of prostate: Secondary | ICD-10-CM

## 2022-11-21 DIAGNOSIS — D649 Anemia, unspecified: Secondary | ICD-10-CM

## 2022-11-21 DIAGNOSIS — Z66 Do not resuscitate: Secondary | ICD-10-CM | POA: Diagnosis not present

## 2022-11-21 DIAGNOSIS — E860 Dehydration: Secondary | ICD-10-CM | POA: Diagnosis present

## 2022-11-21 DIAGNOSIS — Z923 Personal history of irradiation: Secondary | ICD-10-CM

## 2022-11-21 DIAGNOSIS — A4159 Other Gram-negative sepsis: Principal | ICD-10-CM | POA: Diagnosis present

## 2022-11-21 DIAGNOSIS — K921 Melena: Secondary | ICD-10-CM | POA: Diagnosis present

## 2022-11-21 DIAGNOSIS — Z1152 Encounter for screening for COVID-19: Secondary | ICD-10-CM | POA: Diagnosis not present

## 2022-11-21 DIAGNOSIS — Z8249 Family history of ischemic heart disease and other diseases of the circulatory system: Secondary | ICD-10-CM

## 2022-11-21 DIAGNOSIS — N132 Hydronephrosis with renal and ureteral calculous obstruction: Secondary | ICD-10-CM | POA: Diagnosis not present

## 2022-11-21 DIAGNOSIS — C50919 Malignant neoplasm of unspecified site of unspecified female breast: Secondary | ICD-10-CM | POA: Diagnosis not present

## 2022-11-21 DIAGNOSIS — K760 Fatty (change of) liver, not elsewhere classified: Secondary | ICD-10-CM | POA: Diagnosis not present

## 2022-11-21 DIAGNOSIS — F0283 Dementia in other diseases classified elsewhere, unspecified severity, with mood disturbance: Secondary | ICD-10-CM | POA: Diagnosis present

## 2022-11-21 DIAGNOSIS — R188 Other ascites: Secondary | ICD-10-CM | POA: Diagnosis not present

## 2022-11-21 DIAGNOSIS — I82461 Acute embolism and thrombosis of right calf muscular vein: Secondary | ICD-10-CM | POA: Diagnosis present

## 2022-11-21 DIAGNOSIS — R0602 Shortness of breath: Secondary | ICD-10-CM | POA: Diagnosis present

## 2022-11-21 DIAGNOSIS — J9601 Acute respiratory failure with hypoxia: Secondary | ICD-10-CM | POA: Diagnosis present

## 2022-11-21 DIAGNOSIS — R6520 Severe sepsis without septic shock: Secondary | ICD-10-CM | POA: Diagnosis present

## 2022-11-21 DIAGNOSIS — Z86718 Personal history of other venous thrombosis and embolism: Secondary | ICD-10-CM | POA: Diagnosis not present

## 2022-11-21 DIAGNOSIS — F32A Depression, unspecified: Secondary | ICD-10-CM | POA: Diagnosis present

## 2022-11-21 DIAGNOSIS — R230 Cyanosis: Secondary | ICD-10-CM | POA: Diagnosis not present

## 2022-11-21 DIAGNOSIS — I10 Essential (primary) hypertension: Secondary | ICD-10-CM | POA: Diagnosis not present

## 2022-11-21 DIAGNOSIS — N179 Acute kidney failure, unspecified: Secondary | ICD-10-CM | POA: Diagnosis present

## 2022-11-21 DIAGNOSIS — Z87891 Personal history of nicotine dependence: Secondary | ICD-10-CM | POA: Diagnosis not present

## 2022-11-21 DIAGNOSIS — K581 Irritable bowel syndrome with constipation: Secondary | ICD-10-CM | POA: Diagnosis present

## 2022-11-21 DIAGNOSIS — F99 Mental disorder, not otherwise specified: Secondary | ICD-10-CM | POA: Diagnosis not present

## 2022-11-21 DIAGNOSIS — N136 Pyonephrosis: Secondary | ICD-10-CM | POA: Diagnosis present

## 2022-11-21 DIAGNOSIS — R0689 Other abnormalities of breathing: Secondary | ICD-10-CM | POA: Diagnosis not present

## 2022-11-21 DIAGNOSIS — I2510 Atherosclerotic heart disease of native coronary artery without angina pectoris: Secondary | ICD-10-CM | POA: Diagnosis present

## 2022-11-21 DIAGNOSIS — I130 Hypertensive heart and chronic kidney disease with heart failure and stage 1 through stage 4 chronic kidney disease, or unspecified chronic kidney disease: Secondary | ICD-10-CM | POA: Diagnosis present

## 2022-11-21 DIAGNOSIS — R627 Adult failure to thrive: Secondary | ICD-10-CM | POA: Diagnosis not present

## 2022-11-21 DIAGNOSIS — G309 Alzheimer's disease, unspecified: Secondary | ICD-10-CM | POA: Diagnosis present

## 2022-11-21 DIAGNOSIS — F0284 Dementia in other diseases classified elsewhere, unspecified severity, with anxiety: Secondary | ICD-10-CM | POA: Diagnosis present

## 2022-11-21 DIAGNOSIS — Z7901 Long term (current) use of anticoagulants: Secondary | ICD-10-CM | POA: Diagnosis not present

## 2022-11-21 DIAGNOSIS — B961 Klebsiella pneumoniae [K. pneumoniae] as the cause of diseases classified elsewhere: Secondary | ICD-10-CM | POA: Diagnosis not present

## 2022-11-21 DIAGNOSIS — J90 Pleural effusion, not elsewhere classified: Secondary | ICD-10-CM | POA: Diagnosis not present

## 2022-11-21 DIAGNOSIS — Z79899 Other long term (current) drug therapy: Secondary | ICD-10-CM

## 2022-11-21 DIAGNOSIS — I517 Cardiomegaly: Secondary | ICD-10-CM | POA: Diagnosis not present

## 2022-11-21 DIAGNOSIS — N201 Calculus of ureter: Secondary | ICD-10-CM | POA: Diagnosis not present

## 2022-11-21 DIAGNOSIS — T45515A Adverse effect of anticoagulants, initial encounter: Secondary | ICD-10-CM | POA: Diagnosis present

## 2022-11-21 DIAGNOSIS — Z882 Allergy status to sulfonamides status: Secondary | ICD-10-CM

## 2022-11-21 DIAGNOSIS — N10 Acute tubulo-interstitial nephritis: Secondary | ICD-10-CM | POA: Diagnosis not present

## 2022-11-21 DIAGNOSIS — R0902 Hypoxemia: Secondary | ICD-10-CM | POA: Diagnosis not present

## 2022-11-21 DIAGNOSIS — Z803 Family history of malignant neoplasm of breast: Secondary | ICD-10-CM

## 2022-11-21 DIAGNOSIS — N3001 Acute cystitis with hematuria: Secondary | ICD-10-CM | POA: Diagnosis not present

## 2022-11-21 DIAGNOSIS — N139 Obstructive and reflux uropathy, unspecified: Secondary | ICD-10-CM

## 2022-11-21 DIAGNOSIS — Z853 Personal history of malignant neoplasm of breast: Secondary | ICD-10-CM

## 2022-11-21 DIAGNOSIS — Z800 Family history of malignant neoplasm of digestive organs: Secondary | ICD-10-CM

## 2022-11-21 DIAGNOSIS — Z888 Allergy status to other drugs, medicaments and biological substances status: Secondary | ICD-10-CM

## 2022-11-21 DIAGNOSIS — Z91041 Radiographic dye allergy status: Secondary | ICD-10-CM

## 2022-11-21 DIAGNOSIS — C786 Secondary malignant neoplasm of retroperitoneum and peritoneum: Secondary | ICD-10-CM | POA: Diagnosis present

## 2022-11-21 DIAGNOSIS — R58 Hemorrhage, not elsewhere classified: Secondary | ICD-10-CM | POA: Diagnosis not present

## 2022-11-21 DIAGNOSIS — Z8261 Family history of arthritis: Secondary | ICD-10-CM

## 2022-11-21 DIAGNOSIS — D62 Acute posthemorrhagic anemia: Secondary | ICD-10-CM | POA: Diagnosis present

## 2022-11-21 DIAGNOSIS — Z881 Allergy status to other antibiotic agents status: Secondary | ICD-10-CM

## 2022-11-21 DIAGNOSIS — R000 Tachycardia, unspecified: Secondary | ICD-10-CM | POA: Diagnosis not present

## 2022-11-21 DIAGNOSIS — K859 Acute pancreatitis without necrosis or infection, unspecified: Secondary | ICD-10-CM | POA: Diagnosis not present

## 2022-11-21 DIAGNOSIS — N12 Tubulo-interstitial nephritis, not specified as acute or chronic: Secondary | ICD-10-CM

## 2022-11-21 DIAGNOSIS — Z885 Allergy status to narcotic agent status: Secondary | ICD-10-CM

## 2022-11-21 DIAGNOSIS — Z8041 Family history of malignant neoplasm of ovary: Secondary | ICD-10-CM

## 2022-11-21 DIAGNOSIS — Z818 Family history of other mental and behavioral disorders: Secondary | ICD-10-CM

## 2022-11-21 DIAGNOSIS — K219 Gastro-esophageal reflux disease without esophagitis: Secondary | ICD-10-CM | POA: Diagnosis present

## 2022-11-21 DIAGNOSIS — J9811 Atelectasis: Secondary | ICD-10-CM | POA: Diagnosis not present

## 2022-11-21 DIAGNOSIS — R195 Other fecal abnormalities: Secondary | ICD-10-CM | POA: Diagnosis not present

## 2022-11-21 LAB — CBC WITH DIFFERENTIAL/PLATELET
Abs Immature Granulocytes: 0.03 10*3/uL (ref 0.00–0.07)
Basophils Absolute: 0 10*3/uL (ref 0.0–0.1)
Basophils Relative: 0 %
Eosinophils Absolute: 0.1 10*3/uL (ref 0.0–0.5)
Eosinophils Relative: 1 %
HCT: 18.4 % — ABNORMAL LOW (ref 36.0–46.0)
Hemoglobin: 5.6 g/dL — CL (ref 12.0–15.0)
Immature Granulocytes: 0 %
Lymphocytes Relative: 7 %
Lymphs Abs: 0.5 10*3/uL — ABNORMAL LOW (ref 0.7–4.0)
MCH: 27.6 pg (ref 26.0–34.0)
MCHC: 30.4 g/dL (ref 30.0–36.0)
MCV: 90.6 fL (ref 80.0–100.0)
Monocytes Absolute: 0.5 10*3/uL (ref 0.1–1.0)
Monocytes Relative: 7 %
Neutro Abs: 6 10*3/uL (ref 1.7–7.7)
Neutrophils Relative %: 85 %
Platelets: 227 10*3/uL (ref 150–400)
RBC: 2.03 MIL/uL — ABNORMAL LOW (ref 3.87–5.11)
RDW: 16 % — ABNORMAL HIGH (ref 11.5–15.5)
WBC: 7.1 10*3/uL (ref 4.0–10.5)
nRBC: 0.3 % — ABNORMAL HIGH (ref 0.0–0.2)

## 2022-11-21 LAB — LACTIC ACID, PLASMA
Lactic Acid, Venous: 3.6 mmol/L (ref 0.5–1.9)
Lactic Acid, Venous: 5.7 mmol/L (ref 0.5–1.9)

## 2022-11-21 LAB — URINALYSIS, ROUTINE W REFLEX MICROSCOPIC
Bilirubin Urine: NEGATIVE
Glucose, UA: NEGATIVE mg/dL
Ketones, ur: 5 mg/dL — AB
Nitrite: POSITIVE — AB
Protein, ur: 100 mg/dL — AB
Specific Gravity, Urine: 1.029 (ref 1.005–1.030)
pH: 5 (ref 5.0–8.0)

## 2022-11-21 LAB — PROTIME-INR
INR: 2.2 — ABNORMAL HIGH (ref 0.8–1.2)
Prothrombin Time: 24.2 seconds — ABNORMAL HIGH (ref 11.4–15.2)

## 2022-11-21 LAB — POC OCCULT BLOOD, ED: Fecal Occult Bld: POSITIVE — AB

## 2022-11-21 LAB — COMPREHENSIVE METABOLIC PANEL
ALT: 12 U/L (ref 0–44)
AST: 31 U/L (ref 15–41)
Albumin: 3.3 g/dL — ABNORMAL LOW (ref 3.5–5.0)
Alkaline Phosphatase: 74 U/L (ref 38–126)
Anion gap: 13 (ref 5–15)
BUN: 28 mg/dL — ABNORMAL HIGH (ref 8–23)
CO2: 19 mmol/L — ABNORMAL LOW (ref 22–32)
Calcium: 8.7 mg/dL — ABNORMAL LOW (ref 8.9–10.3)
Chloride: 104 mmol/L (ref 98–111)
Creatinine, Ser: 1.41 mg/dL — ABNORMAL HIGH (ref 0.44–1.00)
GFR, Estimated: 35 mL/min — ABNORMAL LOW (ref >60)
Glucose, Bld: 140 mg/dL — ABNORMAL HIGH (ref 70–99)
Potassium: 3.8 mmol/L (ref 3.5–5.1)
Sodium: 136 mmol/L (ref 135–145)
Total Bilirubin: 0.8 mg/dL (ref 0.3–1.2)
Total Protein: 5.9 g/dL — ABNORMAL LOW (ref 6.5–8.1)

## 2022-11-21 LAB — TROPONIN I (HIGH SENSITIVITY)
Troponin I (High Sensitivity): 10 ng/L (ref <18)
Troponin I (High Sensitivity): 7 ng/L (ref <18)

## 2022-11-21 LAB — RESP PANEL BY RT-PCR (RSV, FLU A&B, COVID)  RVPGX2
Influenza A by PCR: NEGATIVE
Influenza B by PCR: NEGATIVE
Resp Syncytial Virus by PCR: NEGATIVE
SARS Coronavirus 2 by RT PCR: NEGATIVE

## 2022-11-21 LAB — PREPARE RBC (CROSSMATCH)

## 2022-11-21 LAB — BRAIN NATRIURETIC PEPTIDE: B Natriuretic Peptide: 129.1 pg/mL — ABNORMAL HIGH (ref 0.0–100.0)

## 2022-11-21 LAB — MAGNESIUM: Magnesium: 1.7 mg/dL (ref 1.7–2.4)

## 2022-11-21 MED ORDER — VANCOMYCIN HCL 750 MG/150ML IV SOLN: 750.0000 mg | INTRAVENOUS | Status: DC

## 2022-11-21 MED ORDER — LACTATED RINGERS IV SOLN: INTRAVENOUS | Status: AC

## 2022-11-21 MED ORDER — SODIUM CHLORIDE 0.9% IV SOLUTION: Freq: Once | INTRAVENOUS | Status: AC

## 2022-11-21 MED ORDER — VANCOMYCIN HCL IN DEXTROSE 1-5 GM/200ML-% IV SOLN
1000.0000 mg | Freq: Once | INTRAVENOUS | Status: AC
  Administered 2022-11-21: 1000 mg via INTRAVENOUS
  Filled 2022-11-21: qty 200

## 2022-11-21 MED ORDER — LACTATED RINGERS IV BOLUS (SEPSIS)
1000.0000 mL | Freq: Once | INTRAVENOUS | Status: AC
  Administered 2022-11-21: 1000 mL via INTRAVENOUS

## 2022-11-21 MED ORDER — METRONIDAZOLE 500 MG/100ML IV SOLN
500.0000 mg | Freq: Once | INTRAVENOUS | Status: AC
  Administered 2022-11-21: 500 mg via INTRAVENOUS
  Filled 2022-11-21: qty 100

## 2022-11-21 MED ORDER — ACETAMINOPHEN 500 MG PO TABS
1000.0000 mg | ORAL_TABLET | Freq: Once | ORAL | Status: AC
  Administered 2022-11-21: 1000 mg via ORAL
  Filled 2022-11-21: qty 2

## 2022-11-21 MED ORDER — SODIUM CHLORIDE 0.9 % IV SOLN
1.0000 g | Freq: Three times a day (TID) | INTRAVENOUS | Status: AC
  Administered 2022-11-22: 2 g via INTRAVENOUS
  Administered 2022-11-22 – 2022-11-25 (×9): 1 g via INTRAVENOUS
  Filled 2022-11-21 (×13): qty 5

## 2022-11-21 MED ORDER — PANTOPRAZOLE SODIUM 40 MG IV SOLR
40.0000 mg | Freq: Two times a day (BID) | INTRAVENOUS | Status: DC
  Administered 2022-11-21: 40 mg via INTRAVENOUS
  Filled 2022-11-21: qty 10

## 2022-11-21 MED ORDER — SODIUM CHLORIDE 0.9 % IV SOLN
2.0000 g | Freq: Once | INTRAVENOUS | Status: AC
  Administered 2022-11-21: 2 g via INTRAVENOUS
  Filled 2022-11-21: qty 10

## 2022-11-21 NOTE — ED Provider Notes (Signed)
[facility] Provider Note   CSN: [id number] Arrival date & time: 11/21/22  1734     History {Add pertinent medical, surgical, social history, OB history to HPI:1} Chief Complaint  Patient presents with   Shortness of Breath    Gloria [name] is a 89 y.o. female.   Shortness of Breath Patient presents for***.  Medical history includes CAD, depression, GERD, HTN.  She went to her primary care doctor for suspected UTI today.  She has reportedly had increased urinary frequency lately.  Primary doctor was concerned of failure to thrive.  Currently, patient lives alone.  EMS was called to the doctor's office.  They noted wheezing and all lung fields.  She was given albuterol and Solu-Medrol during transit.  History per daughter:     Home Medications Prior to Admission medications   Medication Sig Start Date End Date Taking? Authorizing Provider  acetaminophen (TYLENOL) 500 MG tablet Take 1,000 mg by mouth daily as needed for moderate pain. Reported on 03/10/2016    [provider]  ALPRAZolam Duanne Moron) 0.25 MG tablet Take by mouth.    [provider]  apixaban (ELIQUIS) 5 MG TABS tablet Take 1 tablet (5 mg total) by mouth 2 (two) times daily. Start taking after completion of starter pack. 09/27/22   Rebbeca Paul B, RPH-CPP  doxepin (SINEQUAN) 10 MG capsule Take 10 mg by mouth at bedtime.    [provider]  famotidine (PEPCID) 20 MG tablet Take 20 mg by mouth daily.    [provider]  FLOVENT HFA 44 MCG/ACT inhaler Inhale into the lungs. 11/20/20   [provider]  fluticasone (FLONASE) 50 MCG/ACT nasal spray Place 1 spray into both nostrils 2 (two) times daily.    [provider]  guaiFENesin (MUCINEX) 600 MG 12 hr tablet Take 600 mg by mouth daily as needed for cough or to loosen phlegm. Reported on 03/28/2016    [provider]  hydrochlorothiazide (MICROZIDE) 12.5 MG capsule TAKE  1 CAPSULE BY MOUTH ONCE DAILY***APPOINTMENT REQUIRED FOR FUTURE REFILLS*** 10/31/22   Troy Sine, MD  losartan (COZAAR) 25 MG tablet Take 25 mg by mouth daily. Takes 50 mg in the morning and 25 mg in the evening. 10/20/22   [provider]  losartan (COZAAR) 50 MG tablet Take 1 tablet by mouth once daily 11/15/22   Troy Sine, MD  nitroGLYCERIN (NITROSTAT) 0.4 MG SL tablet Place 1 tablet (0.4 mg total) under the tongue every 5 (five) minutes as needed for chest pain. <PLEASE MAKE APPOINTMENT> 02/25/16   Troy Sine, MD  ondansetron (ZOFRAN) 4 MG tablet TAKE 1 TABLET BY MOUTH EVERY 8 HOURS AS NEEDED FOR NAUSEA OR VOMITING. 11/16/22   Truitt Merle, MD  pseudoephedrine-acetaminophen (TYLENOL SINUS) 30-500 MG TABS tablet Take 1 tablet by mouth every 4 (four) hours as needed.    [provider]      Allergies    Contrast media [iodinated contrast media], Penicillins, Adhesive [tape], Cephalexin, Ciprofloxacin, Demerol, Meperidine hcl, Sulfa antibiotics, Sulfamethoxazole-trimethoprim, and Quinolones    Review of Systems   Review of Systems  Unable to perform ROS: Mental status change  Respiratory:  Positive for shortness of breath.   Genitourinary:  Positive for frequency.    Physical Exam Updated Vital Signs There were no vitals taken for this visit. Physical Exam Vitals and nursing note reviewed.  Constitutional:      General: She is not in acute distress.  Appearance: She is well-developed. She is ill-appearing. She is not toxic-appearing or diaphoretic.  HENT:     Head: Normocephalic and atraumatic.     Mouth/Throat:     Mouth: Mucous membranes are moist.  Eyes:     Extraocular Movements: Extraocular movements intact.     Conjunctiva/sclera: Conjunctivae normal.  Cardiovascular:     Rate and Rhythm: Regular rhythm. Tachycardia present.     Heart sounds: No murmur heard. Pulmonary:     Effort: Pulmonary effort is normal. Tachypnea present. No respiratory  distress.     Breath sounds: Normal breath sounds. No decreased breath sounds, wheezing, rhonchi or rales.  Chest:     Chest wall: No tenderness.  Abdominal:     Palpations: Abdomen is soft.     Tenderness: There is no abdominal tenderness.  Musculoskeletal:        General: No swelling. Normal range of motion.     Cervical back: Normal range of motion and neck supple.     Right lower leg: No edema.     Left lower leg: No edema.  Skin:    General: Skin is warm and dry.     Coloration: Skin is not cyanotic or pale.  Neurological:     Mental Status: She is alert. She is disoriented.     Cranial Nerves: Cranial nerves 2-12 are intact.     Motor: Tremor present. No weakness.  Psychiatric:        Mood and Affect: Mood is anxious.        Behavior: Behavior normal.     ED Results / Procedures / Treatments   Labs (all labs ordered are listed, but only abnormal results are displayed) Labs Reviewed - No data to display  EKG None  Radiology No results found.  Procedures Procedures  {Document cardiac monitor, telemetry assessment procedure when appropriate:1}  Medications Ordered in ED Medications - No data to display  ED Course/ Medical Decision Making/ A&P   {   Click here for ABCD2, HEART and other calculatorsREFRESH Note before signing :1}                          Medical Decision Making Amount and/or Complexity of Data Reviewed Labs: ordered. Radiology: ordered.   This patient presents to the ED for concern of ***, this involves an extensive number of treatment options, and is a complaint that carries with it a high risk of complications and morbidity.  The differential diagnosis includes ***   Co morbidities that complicate the patient evaluation  ***   Additional history obtained:  Additional history obtained from *** External records from outside source obtained and reviewed including ***   Lab Tests:  I Ordered, and personally interpreted labs.  The  pertinent results include:  ***   Imaging Studies ordered:  I ordered imaging studies including ***  I independently visualized and interpreted imaging which showed *** I agree with the radiologist interpretation   Cardiac Monitoring: / EKG:  The patient was maintained on a cardiac monitor.  I personally viewed and interpreted the cardiac monitored which showed an underlying rhythm of: ***   Consultations Obtained:  I requested consultation with the ***,  and discussed lab and imaging findings as well as pertinent plan - they recommend: ***   Problem List / ED Course / Critical interventions / Medication management  Patient presents from PCPs office by EMS.  Family reports increased urinary frequency lately.  EMS  reports diffuse wheezing on lung auscultation.  She arrives on nebulized breathing treatment.  Vital signs on arrival are notable for tachycardia, tachypnea.  She is quite warm to the touch.  Rectal temperature was***.  Septic workup and treatment were initiated.***. I ordered medication including ***  for ***  Reevaluation of the patient after these medicines showed that the patient {resolved/improved/worsened:23923::"improved"} I have reviewed the patients home medicines and have made adjustments as needed   Social Determinants of Health:  ***   Test / Admission - Considered:  ***   {Document critical care time when appropriate:1} {Document review of labs and clinical decision tools ie heart score, Chads2Vasc2 etc:1}  {Document your independent review of radiology images, and any outside records:1} {Document your discussion with family members, caretakers, and with consultants:1} {Document social determinants of health affecting pt's care:1} {Document your decision making why or why not admission, treatments were needed:1} Final Clinical Impression(s) / ED Diagnoses Final diagnoses:  None    Rx / DC Orders ED Discharge Orders     None

## 2022-11-21 NOTE — Progress Notes (Addendum)
Pharmacy Antibiotic Note  Gloria [name] is a 89 y.o. female admitted on 11/21/2022 with sepsis.  Pharmacy has been consulted for vanc/aztreonam dosing.  Pt was admitted from MD's office for possible UTI or sepsis. She has multiple allergies listed. Didn't find cephalosporins administration in Epic. Some a questionable in nature. We might need to do a PCN challenge. In the mean time, we will cover with aztreonam for now.   F/u with scr before dosing further abx.  Addendum Scr 1.41  Plan: Vanc 1g x1 then '750mg'$  IV q48>>AUC 467, scr 1.41 Aztreonam 2g IV x1 then 1g IV q8 Levels if needed MRSA PCR   Height: 5' (152.4 cm) Weight: 57.6 kg (126 lb 15.8 oz) IBW/kg (Calculated) : 45.5  Temp (24hrs), Avg:100.2 F (37.9 C), Min:100.2 F (37.9 C), Max:100.2 F (37.9 C)  No results for input(s): "WBC", "CREATININE", "LATICACIDVEN", "VANCOTROUGH", "VANCOPEAK", "VANCORANDOM", "GENTTROUGH", "GENTPEAK", "GENTRANDOM", "TOBRATROUGH", "TOBRAPEAK", "TOBRARND", "AMIKACINPEAK", "AMIKACINTROU", "AMIKACIN" in the last 168 hours.  CrCl cannot be calculated (Patient's most recent lab result is older than the maximum 21 days allowed.).    Allergies  Allergen Reactions   Contrast Media [Iodinated Contrast Media] Anaphylaxis    09/19/17-Pt premedicated. Then pt stated she was DNR. Spoke with [name] who did not realize allergy was recorded as anaphylactic. [name] verbally gave ok to change CT exam to without IV contrast. Pt and daughter do not recall occurrence of anaphylactic reaction to contrast media. Geanie Kenning, 3:27pm   Penicillins Anaphylaxis and Swelling    Has patient had a PCN reaction causing immediate rash, facial/tongue/throat swelling, SOB or lightheadedness with hypotension: Yes Has patient had a PCN reaction causing severe rash involving mucus membranes or skin necrosis: No Has patient had a PCN reaction that required hospitalization Yes Has patient had a PCN reaction occurring within the  last 10 years: No If all of the above answers are "NO", then may proceed with Cephalosporin use.    Adhesive [Tape] Other (See Comments)    blisters   Cephalexin Swelling   Ciprofloxacin Swelling   Demerol Nausea And Vomiting   Meperidine Hcl    Sulfa Antibiotics    Sulfamethoxazole-Trimethoprim Itching and Swelling    REACTION: swelling/hives   Quinolones Itching and Rash    REACTION: itching, rash Pt. Reports no problems with levaquin    Antimicrobials this admission: 1/23 vanc>> 1/23 aztreonam>>  Dose adjustments this admission:   Microbiology results: 1/23 blood>>  Onnie Boer, PharmD, [place], AAHIVP, CPP Infectious Disease Pharmacist 11/21/2022 6:20 PM

## 2022-11-21 NOTE — ED Notes (Signed)
Patient transported to CT 

## 2022-11-21 NOTE — ED Notes (Addendum)
Attempted straight cath at this time with Cristie Hem RN as assist. Straight cath unsuccessful as pt requested to stop mid procedure. Pt tolerated procedure poorly.

## 2022-11-21 NOTE — ED Triage Notes (Signed)
Pt BIB EMS from [facility], seen today for evaluation of suspected UTI. Pt's daughter concerned for dehydration, confusion for 3 days. Per MD, pt is failure to thrive, lives alone. Pt had audible wheezing in all lobes. Pt give '5mg'$  of albuterol en route, '125mg'$  of solumedrol. History of bronchitis. Increased urinary frequency.  EMS Vitals RR 42 EtCO2 24 CBG 208 132/90  20# R forearm

## 2022-11-21 NOTE — Progress Notes (Signed)
Elink following code sepsis

## 2022-11-21 NOTE — Progress Notes (Signed)
Notified bedside nurse of need to draw blood cultures.  

## 2022-11-21 NOTE — ED Notes (Addendum)
Blood consent signed at this time.

## 2022-11-21 NOTE — ED Notes (Signed)
Critical hemoglobin of 5.6 communicated to [facility] MD at this time

## 2022-11-22 ENCOUNTER — Emergency Department (EMERGENCY_DEPARTMENT_HOSPITAL): Payer: PPO | Admitting: Certified Registered Nurse Anesthetist

## 2022-11-22 ENCOUNTER — Telehealth: Payer: Self-pay

## 2022-11-22 ENCOUNTER — Inpatient Hospital Stay (HOSPITAL_COMMUNITY): Payer: PPO

## 2022-11-22 ENCOUNTER — Emergency Department (HOSPITAL_COMMUNITY): Payer: PPO | Admitting: Certified Registered Nurse Anesthetist

## 2022-11-22 ENCOUNTER — Encounter (HOSPITAL_COMMUNITY)
Admission: EM | Disposition: A | Discharge status: Hospice | Payer: Self-pay | Source: Home / Self Care | Attending: Internal Medicine

## 2022-11-22 ENCOUNTER — Emergency Department (HOSPITAL_COMMUNITY): Payer: PPO

## 2022-11-22 DIAGNOSIS — Z7901 Long term (current) use of anticoagulants: Secondary | ICD-10-CM

## 2022-11-22 DIAGNOSIS — F0284 Dementia in other diseases classified elsewhere, unspecified severity, with anxiety: Secondary | ICD-10-CM | POA: Diagnosis present

## 2022-11-22 DIAGNOSIS — A419 Sepsis, unspecified organism: Secondary | ICD-10-CM | POA: Diagnosis not present

## 2022-11-22 DIAGNOSIS — J690 Pneumonitis due to inhalation of food and vomit: Secondary | ICD-10-CM | POA: Diagnosis present

## 2022-11-22 DIAGNOSIS — R6520 Severe sepsis without septic shock: Secondary | ICD-10-CM | POA: Diagnosis present

## 2022-11-22 DIAGNOSIS — K8500 Idiopathic acute pancreatitis without necrosis or infection: Secondary | ICD-10-CM

## 2022-11-22 DIAGNOSIS — I825Z2 Chronic embolism and thrombosis of unspecified deep veins of left distal lower extremity: Secondary | ICD-10-CM | POA: Diagnosis present

## 2022-11-22 DIAGNOSIS — I2510 Atherosclerotic heart disease of native coronary artery without angina pectoris: Secondary | ICD-10-CM | POA: Diagnosis present

## 2022-11-22 DIAGNOSIS — N201 Calculus of ureter: Secondary | ICD-10-CM

## 2022-11-22 DIAGNOSIS — C786 Secondary malignant neoplasm of retroperitoneum and peritoneum: Secondary | ICD-10-CM | POA: Diagnosis present

## 2022-11-22 DIAGNOSIS — Z87891 Personal history of nicotine dependence: Secondary | ICD-10-CM

## 2022-11-22 DIAGNOSIS — I10 Essential (primary) hypertension: Secondary | ICD-10-CM | POA: Diagnosis not present

## 2022-11-22 DIAGNOSIS — M7989 Other specified soft tissue disorders: Secondary | ICD-10-CM | POA: Diagnosis not present

## 2022-11-22 DIAGNOSIS — N10 Acute tubulo-interstitial nephritis: Secondary | ICD-10-CM | POA: Diagnosis not present

## 2022-11-22 DIAGNOSIS — R195 Other fecal abnormalities: Secondary | ICD-10-CM

## 2022-11-22 DIAGNOSIS — F32A Depression, unspecified: Secondary | ICD-10-CM | POA: Diagnosis present

## 2022-11-22 DIAGNOSIS — Z1152 Encounter for screening for COVID-19: Secondary | ICD-10-CM | POA: Diagnosis not present

## 2022-11-22 DIAGNOSIS — D649 Anemia, unspecified: Secondary | ICD-10-CM | POA: Diagnosis not present

## 2022-11-22 DIAGNOSIS — R58 Hemorrhage, not elsewhere classified: Secondary | ICD-10-CM | POA: Diagnosis not present

## 2022-11-22 DIAGNOSIS — C50919 Malignant neoplasm of unspecified site of unspecified female breast: Secondary | ICD-10-CM | POA: Diagnosis not present

## 2022-11-22 DIAGNOSIS — Z7189 Other specified counseling: Secondary | ICD-10-CM | POA: Diagnosis not present

## 2022-11-22 DIAGNOSIS — K219 Gastro-esophageal reflux disease without esophagitis: Secondary | ICD-10-CM | POA: Diagnosis present

## 2022-11-22 DIAGNOSIS — N3001 Acute cystitis with hematuria: Secondary | ICD-10-CM | POA: Diagnosis not present

## 2022-11-22 DIAGNOSIS — N136 Pyonephrosis: Secondary | ICD-10-CM | POA: Diagnosis present

## 2022-11-22 DIAGNOSIS — F0283 Dementia in other diseases classified elsewhere, unspecified severity, with mood disturbance: Secondary | ICD-10-CM | POA: Diagnosis present

## 2022-11-22 DIAGNOSIS — A4159 Other Gram-negative sepsis: Secondary | ICD-10-CM | POA: Diagnosis present

## 2022-11-22 DIAGNOSIS — I130 Hypertensive heart and chronic kidney disease with heart failure and stage 1 through stage 4 chronic kidney disease, or unspecified chronic kidney disease: Secondary | ICD-10-CM | POA: Diagnosis present

## 2022-11-22 DIAGNOSIS — K921 Melena: Secondary | ICD-10-CM | POA: Diagnosis present

## 2022-11-22 DIAGNOSIS — G309 Alzheimer's disease, unspecified: Secondary | ICD-10-CM | POA: Diagnosis present

## 2022-11-22 DIAGNOSIS — H9190 Unspecified hearing loss, unspecified ear: Secondary | ICD-10-CM | POA: Diagnosis present

## 2022-11-22 DIAGNOSIS — J9601 Acute respiratory failure with hypoxia: Secondary | ICD-10-CM | POA: Diagnosis present

## 2022-11-22 DIAGNOSIS — R0602 Shortness of breath: Secondary | ICD-10-CM | POA: Diagnosis present

## 2022-11-22 DIAGNOSIS — N390 Urinary tract infection, site not specified: Secondary | ICD-10-CM | POA: Diagnosis present

## 2022-11-22 DIAGNOSIS — Z515 Encounter for palliative care: Secondary | ICD-10-CM | POA: Diagnosis not present

## 2022-11-22 DIAGNOSIS — Z66 Do not resuscitate: Secondary | ICD-10-CM | POA: Diagnosis not present

## 2022-11-22 DIAGNOSIS — E860 Dehydration: Secondary | ICD-10-CM | POA: Diagnosis present

## 2022-11-22 DIAGNOSIS — D62 Acute posthemorrhagic anemia: Secondary | ICD-10-CM | POA: Diagnosis present

## 2022-11-22 DIAGNOSIS — N179 Acute kidney failure, unspecified: Secondary | ICD-10-CM | POA: Diagnosis present

## 2022-11-22 DIAGNOSIS — T45515A Adverse effect of anticoagulants, initial encounter: Secondary | ICD-10-CM | POA: Diagnosis present

## 2022-11-22 DIAGNOSIS — I82461 Acute embolism and thrombosis of right calf muscular vein: Secondary | ICD-10-CM | POA: Diagnosis present

## 2022-11-22 HISTORY — PX: CYSTOSCOPY W/ URETERAL STENT PLACEMENT: SHX1429

## 2022-11-22 LAB — BLOOD CULTURE ID PANEL (REFLEXED) - BCID2

## 2022-11-22 LAB — POCT I-STAT, CHEM 8
BUN: 23 mg/dL (ref 8–23)
Calcium, Ion: 1.18 mmol/L (ref 1.15–1.40)
Chloride: 104 mmol/L (ref 98–111)
Creatinine, Ser: 1.1 mg/dL — ABNORMAL HIGH (ref 0.44–1.00)
Glucose, Bld: 172 mg/dL — ABNORMAL HIGH (ref 70–99)
HCT: 27 % — ABNORMAL LOW (ref 36.0–46.0)
Hemoglobin: 9.2 g/dL — ABNORMAL LOW (ref 12.0–15.0)
Potassium: 3.7 mmol/L (ref 3.5–5.1)
Sodium: 135 mmol/L (ref 135–145)
TCO2: 20 mmol/L — ABNORMAL LOW (ref 22–32)

## 2022-11-22 LAB — CBC
HCT: 28.1 % — ABNORMAL LOW (ref 36.0–46.0)
Hemoglobin: 9.1 g/dL — ABNORMAL LOW (ref 12.0–15.0)
MCH: 29.6 pg (ref 26.0–34.0)
MCHC: 32.4 g/dL (ref 30.0–36.0)
MCV: 91.5 fL (ref 80.0–100.0)
Platelets: 151 10*3/uL (ref 150–400)
RBC: 3.07 MIL/uL — ABNORMAL LOW (ref 3.87–5.11)
RDW: 15.7 % — ABNORMAL HIGH (ref 11.5–15.5)
WBC: 6.3 10*3/uL (ref 4.0–10.5)
nRBC: 0 % (ref 0.0–0.2)

## 2022-11-22 LAB — MAGNESIUM: Magnesium: 1.8 mg/dL (ref 1.7–2.4)

## 2022-11-22 LAB — COMPREHENSIVE METABOLIC PANEL
ALT: 18 U/L (ref 0–44)
AST: 45 U/L — ABNORMAL HIGH (ref 15–41)
Albumin: 2.8 g/dL — ABNORMAL LOW (ref 3.5–5.0)
Alkaline Phosphatase: 71 U/L (ref 38–126)
Anion gap: 12 (ref 5–15)
BUN: 25 mg/dL — ABNORMAL HIGH (ref 8–23)
CO2: 20 mmol/L — ABNORMAL LOW (ref 22–32)
Calcium: 8.2 mg/dL — ABNORMAL LOW (ref 8.9–10.3)
Chloride: 104 mmol/L (ref 98–111)
Creatinine, Ser: 1.47 mg/dL — ABNORMAL HIGH (ref 0.44–1.00)
GFR, Estimated: 33 mL/min — ABNORMAL LOW (ref >60)
Glucose, Bld: 184 mg/dL — ABNORMAL HIGH (ref 70–99)
Potassium: 3.8 mmol/L (ref 3.5–5.1)
Sodium: 136 mmol/L (ref 135–145)
Total Bilirubin: 1.3 mg/dL — ABNORMAL HIGH (ref 0.3–1.2)
Total Protein: 5.5 g/dL — ABNORMAL LOW (ref 6.5–8.1)

## 2022-11-22 LAB — PROTIME-INR
INR: 1.9 — ABNORMAL HIGH (ref 0.8–1.2)
Prothrombin Time: 21.6 seconds — ABNORMAL HIGH (ref 11.4–15.2)

## 2022-11-22 LAB — LIPASE, BLOOD: Lipase: 26 U/L (ref 11–51)

## 2022-11-22 LAB — PHOSPHORUS: Phosphorus: 4.2 mg/dL (ref 2.5–4.6)

## 2022-11-22 LAB — PROCALCITONIN: Procalcitonin: 26.72 ng/mL

## 2022-11-22 LAB — LACTIC ACID, PLASMA: Lactic Acid, Venous: 2.3 mmol/L (ref 0.5–1.9)

## 2022-11-22 SURGERY — CYSTOSCOPY, WITH RETROGRADE PYELOGRAM AND URETERAL STENT INSERTION
Anesthesia: General | Laterality: Right

## 2022-11-22 MED ORDER — PANTOPRAZOLE SODIUM 40 MG IV SOLR
40.0000 mg | Freq: Two times a day (BID) | INTRAVENOUS | Status: DC
  Administered 2022-11-22 – 2022-11-26 (×9): 40 mg via INTRAVENOUS
  Filled 2022-11-22 (×10): qty 10

## 2022-11-22 MED ORDER — ARTIFICIAL TEARS OPHTHALMIC OINT
TOPICAL_OINTMENT | OPHTHALMIC | Status: AC
  Filled 2022-11-22: qty 3.5

## 2022-11-22 MED ORDER — MORPHINE SULFATE (PF) 2 MG/ML IV SOLN
1.0000 mg | INTRAVENOUS | Status: DC | PRN
  Administered 2022-11-22 (×2): 1 mg via INTRAVENOUS
  Filled 2022-11-22 (×5): qty 1

## 2022-11-22 MED ORDER — PSEUDOEPHEDRINE-ACETAMINOPHEN 30-500 MG PO TABS: 1.0000 | ORAL_TABLET | ORAL | Status: DC | PRN

## 2022-11-22 MED ORDER — ALBUTEROL SULFATE (2.5 MG/3ML) 0.083% IN NEBU
2.5000 mg | INHALATION_SOLUTION | Freq: Once | RESPIRATORY_TRACT | Status: AC
  Administered 2022-11-22: 2.5 mg via RESPIRATORY_TRACT

## 2022-11-22 MED ORDER — ALBUTEROL SULFATE (2.5 MG/3ML) 0.083% IN NEBU
2.5000 mg | INHALATION_SOLUTION | RESPIRATORY_TRACT | Status: DC | PRN
  Administered 2022-11-23 (×2): 2.5 mg via RESPIRATORY_TRACT
  Filled 2022-11-22 (×2): qty 3

## 2022-11-22 MED ORDER — DOXEPIN HCL 10 MG PO CAPS
10.0000 mg | ORAL_CAPSULE | Freq: Every day | ORAL | Status: DC
  Administered 2022-11-22 – 2022-11-28 (×7): 10 mg via ORAL
  Filled 2022-11-22 (×8): qty 1

## 2022-11-22 MED ORDER — SODIUM CHLORIDE 0.9 % IR SOLN: Status: DC | PRN

## 2022-11-22 MED ORDER — ALBUTEROL SULFATE (2.5 MG/3ML) 0.083% IN NEBU: 2.5000 mg | INHALATION_SOLUTION | Freq: Four times a day (QID) | RESPIRATORY_TRACT | Status: DC

## 2022-11-22 MED ORDER — ACETAMINOPHEN 650 MG RE SUPP: 650.0000 mg | Freq: Four times a day (QID) | RECTAL | Status: DC | PRN

## 2022-11-22 MED ORDER — ZOLPIDEM TARTRATE 5 MG PO TABS
5.0000 mg | ORAL_TABLET | Freq: Every evening | ORAL | Status: DC | PRN
  Administered 2022-11-22: 5 mg via ORAL
  Filled 2022-11-22: qty 1

## 2022-11-22 MED ORDER — ONDANSETRON HCL 4 MG/2ML IJ SOLN
INTRAMUSCULAR | Status: AC
  Filled 2022-11-22: qty 2

## 2022-11-22 MED ORDER — LIDOCAINE 2% (20 MG/ML) 5 ML SYRINGE
INTRAMUSCULAR | Status: DC | PRN
  Administered 2022-11-22: 20 mg via INTRAVENOUS

## 2022-11-22 MED ORDER — FLUTICASONE PROPIONATE 50 MCG/ACT NA SUSP
1.0000 | Freq: Two times a day (BID) | NASAL | Status: DC
  Administered 2022-11-22 – 2022-11-29 (×15): 1 via NASAL
  Filled 2022-11-22: qty 16

## 2022-11-22 MED ORDER — ONDANSETRON HCL 4 MG/2ML IJ SOLN
INTRAMUSCULAR | Status: DC | PRN
  Administered 2022-11-22: 4 mg via INTRAVENOUS

## 2022-11-22 MED ORDER — DEXAMETHASONE SODIUM PHOSPHATE 10 MG/ML IJ SOLN
INTRAMUSCULAR | Status: AC
  Filled 2022-11-22: qty 1

## 2022-11-22 MED ORDER — ALPRAZOLAM 0.25 MG PO TABS
0.2500 mg | ORAL_TABLET | Freq: Two times a day (BID) | ORAL | Status: DC | PRN
  Administered 2022-11-22 – 2022-11-28 (×11): 0.25 mg via ORAL
  Filled 2022-11-22 (×13): qty 1

## 2022-11-22 MED ORDER — LIDOCAINE 2% (20 MG/ML) 5 ML SYRINGE
INTRAMUSCULAR | Status: AC
  Filled 2022-11-22: qty 5

## 2022-11-22 MED ORDER — HYDRALAZINE HCL 20 MG/ML IJ SOLN: 10.0000 mg | Freq: Four times a day (QID) | INTRAMUSCULAR | Status: DC | PRN

## 2022-11-22 MED ORDER — PHENYLEPHRINE 80 MCG/ML (10ML) SYRINGE FOR IV PUSH (FOR BLOOD PRESSURE SUPPORT)
PREFILLED_SYRINGE | INTRAVENOUS | Status: DC | PRN
  Administered 2022-11-22 (×3): 160 ug via INTRAVENOUS

## 2022-11-22 MED ORDER — ACETAMINOPHEN 10 MG/ML IV SOLN
INTRAVENOUS | Status: DC | PRN
  Administered 2022-11-22: 1000 mg via INTRAVENOUS

## 2022-11-22 MED ORDER — IOHEXOL 300 MG/ML  SOLN
INTRAMUSCULAR | Status: DC | PRN
  Administered 2022-11-22: 2 mL

## 2022-11-22 MED ORDER — WATER FOR IRRIGATION, STERILE IR SOLN
Status: DC | PRN
  Administered 2022-11-22: 1000 mL

## 2022-11-22 MED ORDER — LACTATED RINGERS IV BOLUS (SEPSIS)
1000.0000 mL | Freq: Once | INTRAVENOUS | Status: AC
  Administered 2022-11-22: 1000 mL via INTRAVENOUS

## 2022-11-22 MED ORDER — ACETAMINOPHEN 325 MG PO TABS
650.0000 mg | ORAL_TABLET | Freq: Four times a day (QID) | ORAL | Status: DC | PRN
  Administered 2022-11-22 – 2022-11-25 (×3): 650 mg via ORAL
  Filled 2022-11-22 (×5): qty 2

## 2022-11-22 MED ORDER — DIPHENHYDRAMINE HCL 50 MG/ML IJ SOLN
INTRAMUSCULAR | Status: AC
  Filled 2022-11-22: qty 1

## 2022-11-22 MED ORDER — GUAIFENESIN ER 600 MG PO TB12
600.0000 mg | ORAL_TABLET | Freq: Every day | ORAL | Status: DC | PRN
  Administered 2022-11-22 – 2022-11-29 (×5): 600 mg via ORAL
  Filled 2022-11-22 (×6): qty 1

## 2022-11-22 MED ORDER — PROPOFOL 10 MG/ML IV BOLUS
INTRAVENOUS | Status: DC | PRN
  Administered 2022-11-22: 100 mg via INTRAVENOUS

## 2022-11-22 MED ORDER — ACETAMINOPHEN 500 MG PO TABS
500.0000 mg | ORAL_TABLET | ORAL | Status: DC | PRN
  Filled 2022-11-22: qty 1

## 2022-11-22 MED ORDER — ACETAMINOPHEN 10 MG/ML IV SOLN
INTRAVENOUS | Status: AC
  Filled 2022-11-22: qty 100

## 2022-11-22 MED ORDER — SODIUM CHLORIDE 0.9 % IV SOLN: INTRAVENOUS | Status: DC

## 2022-11-22 MED ORDER — DEXAMETHASONE SODIUM PHOSPHATE 10 MG/ML IJ SOLN
INTRAMUSCULAR | Status: DC | PRN
  Administered 2022-11-22: 10 mg via INTRAVENOUS

## 2022-11-22 MED ORDER — PROPOFOL 10 MG/ML IV BOLUS
INTRAVENOUS | Status: AC
  Filled 2022-11-22: qty 20

## 2022-11-22 MED ORDER — PSEUDOEPHEDRINE HCL 30 MG PO TABS: 30.0000 mg | ORAL_TABLET | ORAL | Status: DC | PRN

## 2022-11-22 MED ORDER — FENTANYL CITRATE (PF) 100 MCG/2ML IJ SOLN
INTRAMUSCULAR | Status: AC
  Filled 2022-11-22: qty 2

## 2022-11-22 MED ORDER — PHENYLEPHRINE 80 MCG/ML (10ML) SYRINGE FOR IV PUSH (FOR BLOOD PRESSURE SUPPORT)
PREFILLED_SYRINGE | INTRAVENOUS | Status: AC
  Filled 2022-11-22: qty 10

## 2022-11-22 MED ORDER — FUROSEMIDE 10 MG/ML IJ SOLN
20.0000 mg | INTRAMUSCULAR | Status: AC
  Administered 2022-11-22: 20 mg via INTRAVENOUS
  Filled 2022-11-22: qty 2
  Filled 2022-11-22: qty 4

## 2022-11-22 MED ORDER — ALBUTEROL SULFATE (2.5 MG/3ML) 0.083% IN NEBU
INHALATION_SOLUTION | RESPIRATORY_TRACT | Status: AC
  Filled 2022-11-22: qty 3

## 2022-11-22 MED ORDER — FENTANYL CITRATE (PF) 100 MCG/2ML IJ SOLN: 25.0000 ug | INTRAMUSCULAR | Status: DC | PRN

## 2022-11-22 MED ORDER — DIPHENHYDRAMINE HCL 50 MG/ML IJ SOLN
INTRAMUSCULAR | Status: DC | PRN
  Administered 2022-11-22: 12.5 mg via INTRAVENOUS

## 2022-11-22 MED ORDER — GADOBUTROL 1 MMOL/ML IV SOLN
7.5000 mL | Freq: Once | INTRAVENOUS | Status: AC | PRN
  Administered 2022-11-22: 7.5 mL via INTRAVENOUS

## 2022-11-22 SURGICAL SUPPLY — 25 items
BAG DRN RND TRDRP ANRFLXCHMBR (UROLOGICAL SUPPLIES) ×1
BAG URINE DRAIN 2000ML AR STRL (UROLOGICAL SUPPLIES) ×2 IMPLANT
BAG URO CATCHER STRL LF (MISCELLANEOUS) ×2 IMPLANT
CATH FOLEY 2WAY SLVR  5CC 14FR (CATHETERS) ×1
CATH FOLEY 2WAY SLVR 5CC 14FR (CATHETERS) IMPLANT
CATH URETL OPEN 5X70 (CATHETERS) IMPLANT
CATH URETL OPEN END 6FR 70 (CATHETERS) ×2 IMPLANT
GLOVE BIO SURGEON STRL SZ8 (GLOVE) ×2 IMPLANT
GOWN STRL REUS W/ TWL LRG LVL3 (GOWN DISPOSABLE) ×2 IMPLANT
GOWN STRL REUS W/ TWL XL LVL3 (GOWN DISPOSABLE) ×2 IMPLANT
GOWN STRL REUS W/TWL LRG LVL3 (GOWN DISPOSABLE) ×1
GOWN STRL REUS W/TWL XL LVL3 (GOWN DISPOSABLE) ×1
GUIDEWIRE ANG ZIPWIRE 038X150 (WIRE) IMPLANT
GUIDEWIRE STR DUAL SENSOR (WIRE) ×2 IMPLANT
KIT TURNOVER KIT B (KITS) ×2 IMPLANT
MANIFOLD NEPTUNE II (INSTRUMENTS) IMPLANT
NS IRRIG 1000ML POUR BTL (IV SOLUTION) IMPLANT
PACK CYSTO (CUSTOM PROCEDURE TRAY) ×2 IMPLANT
STENT URET 6FRX24 CONTOUR (STENTS) IMPLANT
STENT URET 6FRX26 CONTOUR (STENTS) IMPLANT
SYPHON OMNI JUG (MISCELLANEOUS) ×2 IMPLANT
SYR 10ML LL (SYRINGE) IMPLANT
TOWEL GREEN STERILE FF (TOWEL DISPOSABLE) ×2 IMPLANT
TUBE CONNECTING 12X1/4 (SUCTIONS) IMPLANT
WATER STERILE IRR 3000ML UROMA (IV SOLUTION) ×2 IMPLANT

## 2022-11-22 NOTE — Evaluation (Signed)
Physical Therapy Evaluation Patient Details Name: Gloria [name] MRN: [id number] DOB: 12-31-1927 Today's Date: 11/22/2022  History of Present Illness  89 y.o. female presents to [facility] hospital on 11/21/2022 with confusion, fever, dysuria and abdominal pain. CT scan demonstrates retroperitoneal stranding and worsening R hydronephrosis with a 56m proximal ureteral stone. Pt also noted to be anemic, concern for GIB. Pt underwent ureteral stent placement on 1/24. PMH includes GERD, HTN, UTI, anxiety, CAD.  Clinical Impression  Pt presents to PT with deficits in activity tolerance, functional mobility, strength, power, endurance, cognition. Pt fatigues quickly during session, only tolerating bed mobility with moderate exertion. Pt is generally weak, requiring assistance for bed mobility at this time. PT encourages early and frequent mobilization in an effort to improve strength and endurance. PT is hopeful the pt will progress in the acute setting, initially recommending discharge home with HHPT and a manual wheelchair. SNF placement may need to be considered if pt does not progress well and family are unable to assist adequately.       Recommendations for follow up therapy are one component of a multi-disciplinary discharge planning process, led by the attending physician.  Recommendations may be updated based on patient status, additional functional criteria and insurance authorization.  Follow Up Recommendations Home health PT (hopeful for progress during acute stay, may need to consider SNF if pt does not begin to progress next session)      Assistance Recommended at Discharge Frequent or constant Supervision/Assistance  Patient can return home with the following  Two people to help with walking and/or transfers;A lot of help with bathing/dressing/bathroom;Assistance with cooking/housework;Assist for transportation;Help with stairs or ramp for entrance    Equipment Recommendations Wheelchair  (measurements PT);Wheelchair cushion (measurements PT)  Recommendations for Other Services       Functional Status Assessment Patient has had a recent decline in their functional status and demonstrates the ability to make significant improvements in function in a reasonable and predictable amount of time.     Precautions / Restrictions Precautions Precautions: Fall Precaution Comments: monitor sats Restrictions Weight Bearing Restrictions: No      Mobility  Bed Mobility Overal bed mobility: Needs Assistance Bed Mobility: Supine to Sit, Sit to Supine     Supine to sit: Min assist, HOB elevated Sit to supine: Min guard        Transfers Overall transfer level:  (deferred 2/2 fatigue with sitting)                      Ambulation/Gait                  Stairs            Wheelchair Mobility    Modified Rankin (Stroke Patients Only)       Balance Overall balance assessment: Needs assistance Sitting-balance support: No upper extremity supported, Feet supported Sitting balance-Leahy Scale: Fair                                       Pertinent Vitals/Pain Pain Assessment Pain Assessment: 0-10 Pain Score: 5  Pain Location: RLQ/groin Pain Descriptors / Indicators: Aching Pain Intervention(s): Monitored during session    Home Living Family/patient expects to be discharged to:: Private residence Living Arrangements: Alone Available Help at Discharge: Family;Available PRN/intermittently (granddaughter from 8-5 daily, daughter lives close for assistance at night) Type of Home: House Home Access:  Stairs to enter Entrance Stairs-Rails: None Entrance Stairs-Number of Steps: 1+1 Alternate Level Stairs-Number of Steps: stair lift Home Layout: Two level Home Equipment: Conservation officer, nature (2 wheels);Cane - single point;BSC/3in1;Tub bench      Prior Function Prior Level of Function : Needs assist             Mobility Comments: pt  ambulates short household distances with RW ADLs Comments: intermittent assistance with ADLs and IADLs from family     Hand Dominance        Extremity/Trunk Assessment   Upper Extremity Assessment Upper Extremity Assessment: Overall WFL for tasks assessed    Lower Extremity Assessment Lower Extremity Assessment: Overall WFL for tasks assessed    Cervical / Trunk Assessment Cervical / Trunk Assessment: Kyphotic  Communication   Communication: HOH  Cognition Arousal/Alertness: Awake/alert Behavior During Therapy: WFL for tasks assessed/performed Overall Cognitive Status: Impaired/Different from baseline Area of Impairment: Problem solving, Memory                     Memory: Decreased short-term memory       Problem Solving: Slow processing          General Comments General comments (skin integrity, edema, etc.): pt on 2L [place], gradually desats to 90% when weaned to room air when mobilizing with noted increase in WOB.    Exercises     Assessment/Plan    PT Assessment Patient needs continued PT services  PT Problem List Decreased strength;Decreased activity tolerance;Decreased balance;Decreased mobility;Decreased cognition;Cardiopulmonary status limiting activity       PT Treatment Interventions Gait training;DME instruction;Functional mobility training;Therapeutic activities;Therapeutic exercise;Balance training;Neuromuscular re-education;Patient/family education;Wheelchair mobility training    PT Goals (Current goals can be found in the Care Plan section)  Acute Rehab PT Goals Patient Stated Goal: to improve strength and endurance, return to walking PT Goal Formulation: With patient/family Time For Goal Achievement: 12/06/22 Potential to Achieve Goals: Fair    Frequency Min 3X/week     Co-evaluation               AM-PAC PT "6 Clicks" Mobility  Outcome Measure Help needed turning from your back to your side while in a flat bed without using  bedrails?: A Little Help needed moving from lying on your back to sitting on the side of a flat bed without using bedrails?: A Little Help needed moving to and from a bed to a chair (including a wheelchair)?: Total Help needed standing up from a chair using your arms (e.g., wheelchair or bedside chair)?: Total Help needed to walk in hospital room?: Total Help needed climbing 3-5 steps with a railing? : Total 6 Click Score: 10    End of Session Equipment Utilized During Treatment: Oxygen Activity Tolerance: Patient limited by fatigue Patient left: in bed;with call bell/phone within reach;with bed alarm set Nurse Communication: Mobility status PT Visit Diagnosis: Other abnormalities of gait and mobility (R26.89);Muscle weakness (generalized) (M62.81)    Time: 1610-9604 PT Time Calculation (min) (ACUTE ONLY): 28 min   Charges:   PT Evaluation $PT Eval Moderate Complexity: 1 Mod          Zenaida Niece, PT, DPT Acute Rehabilitation Office [phone]   Zenaida Niece 11/22/2022, 10:53 AM

## 2022-11-22 NOTE — Anesthesia Procedure Notes (Signed)
Procedure Name: LMA Insertion Date/Time: 11/22/2022 1:57 AM  Performed by: Reece Agar, CRNAPre-anesthesia Checklist: Patient identified, Emergency Drugs available, Suction available and Patient being monitored Patient Re-evaluated:Patient Re-evaluated prior to induction Oxygen Delivery Method: Circle System Utilized Preoxygenation: Pre-oxygenation with 100% oxygen Induction Type: IV induction Ventilation: Mask ventilation without difficulty LMA: LMA inserted LMA Size: 3.0 Number of attempts: 1 Airway Equipment and Method: Bite block Placement Confirmation: positive ETCO2 Tube secured with: Tape Dental Injury: Teeth and Oropharynx as per pre-operative assessment

## 2022-11-22 NOTE — Progress Notes (Addendum)
Gloria [name]   DOB:Jul 30, 1928   GG#:[id number]   LYY#:[id number]  Oncology follow up   Subjective: Patient is well-known to me, under my care for her metastatic breast cancer.  She is currently on supportive care and surveillance only.  She was last seen in my office in October 2023.  She was found to have right lower extremity DVT by the end of November 2023, and was started on Eliquis.  She has had intermittent melena since then.  She presented with fever, increased urinary frequency, poor appetite, dehydration, and confusion for 3 days.  She was admitted for sepsis from UTI secondary to obstructive right ureterolithiasis, and underwent urgent ureteral stent placement.  Her CT scan is also concerning for pancreatitis, she has been seen by GI, and awaiting for MRI/MRCP.  She was found to have severe anemia on admission, and received a blood transfusion.   Objective:  Vitals:   11/22/22 1155 11/22/22 1525  BP: 125/71 124/69  Pulse: 62 71  Resp: 19 15  Temp: (!) 97.5 F (36.4 C) 98.4 F (36.9 C)  SpO2: 95% 96%    Body mass index is 24.8 kg/m.  Intake/Output Summary (Last 24 hours) at 11/22/2022 1733 Last data filed at 11/22/2022 0620 Gross per 24 hour  Intake 2420.67 ml  Output 275 ml  Net 2145.67 ml     Sclerae unicteric  Oropharynx clear  No peripheral adenopathy  Lungs clear -- no rales or rhonchi  Heart regular rate and rhythm  Abdomen benign  MSK no focal spinal tenderness, (+) left lower extremity edema above ankle and foot   Neuro nonfocal    CBG (last 3)  No results for input(s): "GLUCAP" in the last 72 hours.   Labs:   Urine Studies No results for input(s): "UHGB", "CRYS" in the last 72 hours.  Invalid input(s): "UACOL", "UAPR", "USPG", "UPH", "UTP", "UGL", "UKET", "UBIL", "UNIT", "UROB", "ULEU", "UEPI", "UWBC", "URBC", "UBAC", "CAST", "UCOM", "BILUA"  Basic Metabolic Panel: Recent Labs  Lab 11/21/22 1758 11/22/22 0204 11/22/22 0500  NA 136 135 136  K 3.8  3.7 3.8  CL 104 104 104  CO2 19*  --  20*  GLUCOSE 140* 172* 184*  BUN 28* 23 25*  CREATININE 1.41* 1.10* 1.47*  CALCIUM 8.7*  --  8.2*  MG 1.7  --  1.8  PHOS  --   --  4.2   GFR Estimated Creatinine Clearance: 19.4 mL/min (A) (by C-G formula based on SCr of 1.47 mg/dL (H)). Liver Function Tests: Recent Labs  Lab 11/21/22 1758 11/22/22 0500  AST 31 45*  ALT 12 18  ALKPHOS 74 71  BILITOT 0.8 1.3*  PROT 5.9* 5.5*  ALBUMIN 3.3* 2.8*   Recent Labs  Lab 11/22/22 0041  LIPASE 26   No results for input(s): "AMMONIA" in the last 168 hours. Coagulation profile Recent Labs  Lab 11/21/22 1758 11/22/22 0500  INR 2.2* 1.9*    CBC: Recent Labs  Lab 11/21/22 1758 11/22/22 0204 11/22/22 0500  WBC 7.1  --  6.3  NEUTROABS 6.0  --   --   HGB 5.6* 9.2* 9.1*  HCT 18.4* 27.0* 28.1*  MCV 90.6  --  91.5  PLT 227  --  151   Cardiac Enzymes: No results for input(s): "CKTOTAL", "CKMB", "CKMBINDEX", "TROPONINI" in the last 168 hours. BNP: Invalid input(s): "POCBNP" CBG: No results for input(s): "GLUCAP" in the last 168 hours. D-Dimer No results for input(s): "DDIMER" in the last 72 hours. Hgb A1c No  results for input(s): "HGBA1C" in the last 72 hours. Lipid Profile No results for input(s): "CHOL", "HDL", "LDLCALC", "TRIG", "CHOLHDL", "LDLDIRECT" in the last 72 hours. Thyroid function studies No results for input(s): "TSH", "T4TOTAL", "T3FREE", "THYROIDAB" in the last 72 hours.  Invalid input(s): "FREET3" Anemia work up No results for input(s): "VITAMINB12", "FOLATE", "FERRITIN", "TIBC", "IRON", "RETICCTPCT" in the last 72 hours. Microbiology Recent Results (from the past 240 hour(s))  Culture, blood (Routine x 2)     Status: None (Preliminary result)   Collection Time: 11/21/22  6:07 PM   Specimen: BLOOD  Result Value Ref Range Status   Specimen Description BLOOD RIGHT ANTECUBITAL  Final   Special Requests   Final    BOTTLES DRAWN AEROBIC AND ANAEROBIC Blood Culture  adequate volume   Culture  Setup Time   Final    GRAM NEGATIVE RODS ANAEROBIC BOTTLE ONLY Organism ID to follow CRITICAL RESULT CALLED TO, READ BACK BY AND VERIFIED WITHCathlyn Parsons, AT 1212 11/22/22 [facility] Performed at [facility], [place] [address] [place], [place] [address]    Culture GRAM NEGATIVE RODS  Final   Report Status PENDING  Incomplete  Blood Culture ID Panel (Reflexed)     Status: Abnormal   Collection Time: 11/21/22  6:07 PM  Result Value Ref Range Status   Enterococcus faecalis NOT DETECTED NOT DETECTED Final   Enterococcus Faecium NOT DETECTED NOT DETECTED Final   Listeria monocytogenes NOT DETECTED NOT DETECTED Final   Staphylococcus species NOT DETECTED NOT DETECTED Final   Staphylococcus aureus (BCID) NOT DETECTED NOT DETECTED Final   Staphylococcus epidermidis NOT DETECTED NOT DETECTED Final   Staphylococcus lugdunensis NOT DETECTED NOT DETECTED Final   Streptococcus species NOT DETECTED NOT DETECTED Final   Streptococcus agalactiae NOT DETECTED NOT DETECTED Final   Streptococcus pneumoniae NOT DETECTED NOT DETECTED Final   Streptococcus pyogenes NOT DETECTED NOT DETECTED Final   A.calcoaceticus-baumannii NOT DETECTED NOT DETECTED Final   Bacteroides fragilis NOT DETECTED NOT DETECTED Final   Enterobacterales DETECTED (A) NOT DETECTED Final    Comment: Enterobacterales represent a large order of gram negative bacteria, not a single organism. CRITICAL RESULT CALLED TO, READ BACK BY AND VERIFIED WITH: E. MARTIN PHARMD, AT 1212 11/22/22 D. VANHOOK    Enterobacter cloacae complex NOT DETECTED NOT DETECTED Final   Escherichia coli NOT DETECTED NOT DETECTED Final   Klebsiella aerogenes NOT DETECTED NOT DETECTED Final   Klebsiella oxytoca NOT DETECTED NOT DETECTED Final   Klebsiella pneumoniae DETECTED (A) NOT DETECTED Final    Comment: CRITICAL RESULT CALLED TO, READ BACK BY AND VERIFIED WITH: E. MARTIN PHARMD, AT 1212 11/22/22 D. VANHOOK    Proteus  species NOT DETECTED NOT DETECTED Final   Salmonella species NOT DETECTED NOT DETECTED Final   Serratia marcescens NOT DETECTED NOT DETECTED Final   Haemophilus influenzae NOT DETECTED NOT DETECTED Final   Neisseria meningitidis NOT DETECTED NOT DETECTED Final   Pseudomonas aeruginosa NOT DETECTED NOT DETECTED Final   Stenotrophomonas maltophilia NOT DETECTED NOT DETECTED Final   Candida albicans NOT DETECTED NOT DETECTED Final   Candida auris NOT DETECTED NOT DETECTED Final   Candida glabrata NOT DETECTED NOT DETECTED Final   Candida krusei NOT DETECTED NOT DETECTED Final   Candida parapsilosis NOT DETECTED NOT DETECTED Final   Candida tropicalis NOT DETECTED NOT DETECTED Final   Cryptococcus neoformans/gattii NOT DETECTED NOT DETECTED Final   CTX-M ESBL NOT DETECTED NOT DETECTED Final   Carbapenem resistance IMP NOT  DETECTED NOT DETECTED Final   Carbapenem resistance KPC NOT DETECTED NOT DETECTED Final   Carbapenem resistance NDM NOT DETECTED NOT DETECTED Final   Carbapenem resist OXA 48 LIKE NOT DETECTED NOT DETECTED Final   Carbapenem resistance VIM NOT DETECTED NOT DETECTED Final    Comment: Performed at [facility], [place] [address], [place], [place] [address]  Resp panel by RT-PCR (RSV, Flu A&B, Covid) Anterior Nasal Swab     Status: None   Collection Time: 11/21/22  6:08 PM   Specimen: Anterior Nasal Swab  Result Value Ref Range Status   SARS Coronavirus 2 by RT PCR NEGATIVE NEGATIVE Final    Comment: (NOTE) SARS-CoV-2 target nucleic acids are NOT DETECTED.  The SARS-CoV-2 RNA is generally detectable in upper respiratory specimens during the acute phase of infection. The lowest concentration of SARS-CoV-2 viral copies this assay can detect is 138 copies/mL. A negative result does not preclude SARS-Cov-2 infection and should not be used as the sole basis for treatment or other patient management decisions. A negative result may occur with  improper specimen  collection/handling, submission of specimen other than nasopharyngeal swab, presence of viral mutation(s) within the areas targeted by this assay, and inadequate number of viral copies(<138 copies/mL). A negative result must be combined with clinical observations, patient history, and epidemiological information. The expected result is Negative.  Fact Sheet for Patients:  EntrepreneurPulse.com.au  Fact Sheet for Healthcare Providers:  IncredibleEmployment.be  This test is no t yet approved or cleared by the [place] FDA and  has been authorized for detection and/or diagnosis of SARS-CoV-2 by FDA under an Emergency Use Authorization (EUA). This EUA will remain  in effect (meaning this test can be used) for the duration of the COVID-19 declaration under Section 564(b)(1) of the Act, 21 U.S.C.section 360bbb-3(b)(1), unless the authorization is terminated  or revoked sooner.       Influenza A by PCR NEGATIVE NEGATIVE Final   Influenza B by PCR NEGATIVE NEGATIVE Final    Comment: (NOTE) The Xpert Xpress SARS-CoV-2/FLU/RSV plus assay is intended as an aid in the diagnosis of influenza from Nasopharyngeal swab specimens and should not be used as a sole basis for treatment. Nasal washings and aspirates are unacceptable for Xpert Xpress SARS-CoV-2/FLU/RSV testing.  Fact Sheet for Patients: EntrepreneurPulse.com.au  Fact Sheet for Healthcare Providers: IncredibleEmployment.be  This test is not yet approved or cleared by the [place] FDA and has been authorized for detection and/or diagnosis of SARS-CoV-2 by FDA under an Emergency Use Authorization (EUA). This EUA will remain in effect (meaning this test can be used) for the duration of the COVID-19 declaration under Section 564(b)(1) of the Act, 21 U.S.C. section 360bbb-3(b)(1), unless the authorization is terminated or revoked.     Resp Syncytial  Virus by PCR NEGATIVE NEGATIVE Final    Comment: (NOTE) Fact Sheet for Patients: EntrepreneurPulse.com.au  Fact Sheet for Healthcare Providers: IncredibleEmployment.be  This test is not yet approved or cleared by the [place] FDA and has been authorized for detection and/or diagnosis of SARS-CoV-2 by FDA under an Emergency Use Authorization (EUA). This EUA will remain in effect (meaning this test can be used) for the duration of the COVID-19 declaration under Section 564(b)(1) of the Act, 21 U.S.C. section 360bbb-3(b)(1), unless the authorization is terminated or revoked.  Performed at [facility], [place] [address], [place], [place] [address]       Studies:  VAS [place] LOWER EXTREMITY VENOUS (DVT)  Result Date:  11/22/2022  Lower Venous DVT Study Patient Name:  IDANIA DESOUZA  Date of Exam:   11/22/2022 Medical Rec #: [id number]      Accession #:    [id number] Date of Birth: 09-25-1928      Patient Gender: F Patient Age:   89 years Exam Location:  [facility] Procedure:      VAS [place] LOWER EXTREMITY VENOUS (DVT) Referring Phys: Terrilee Croak --------------------------------------------------------------------------------  Indications: Swelling, Eliquis discontinued secondary to bleeding.  Risk Factors: None identified. Limitations: Patient pain/guarding. Comparison Study: 09-27-2022 Right lower extremity venous study was positive for                   DVT. Performing Technologist: Darlin Coco RDMS, RVT  Examination Guidelines: A complete evaluation includes B-mode imaging, spectral Doppler, color Doppler, and power Doppler as needed of all accessible portions of each vessel. Bilateral testing is considered an integral part of a complete examination. Limited examinations for reoccurring indications may be performed as noted. The reflux portion of the exam is performed with the patient in reverse Trendelenburg.   +---------+---------------+---------+-----------+----------+--------------+ RIGHT    CompressibilityPhasicitySpontaneityPropertiesThrombus Aging +---------+---------------+---------+-----------+----------+--------------+ CFV      Full           Yes      Yes                                 +---------+---------------+---------+-----------+----------+--------------+ SFJ      Full                                                        +---------+---------------+---------+-----------+----------+--------------+ FV Prox  Full                                                        +---------+---------------+---------+-----------+----------+--------------+ FV Mid   Full                                                        +---------+---------------+---------+-----------+----------+--------------+ FV DistalFull                                                        +---------+---------------+---------+-----------+----------+--------------+ PFV      Full                                                        +---------+---------------+---------+-----------+----------+--------------+ POP      Full           Yes      Yes                                 +---------+---------------+---------+-----------+----------+--------------+  PTV      Full                                                        +---------+---------------+---------+-----------+----------+--------------+ PERO     Full                                                        +---------+---------------+---------+-----------+----------+--------------+ Gastroc  None           No       No                   Acute          +---------+---------------+---------+-----------+----------+--------------+   +---------+---------------+---------+-----------+----------+-----------------+ LEFT     CompressibilityPhasicitySpontaneityPropertiesThrombus Aging     +---------+---------------+---------+-----------+----------+-----------------+ CFV      Full           Yes      Yes                                    +---------+---------------+---------+-----------+----------+-----------------+ SFJ      Full                                                           +---------+---------------+---------+-----------+----------+-----------------+ FV Prox  Full                                                           +---------+---------------+---------+-----------+----------+-----------------+ FV Mid   Full                                                           +---------+---------------+---------+-----------+----------+-----------------+ FV DistalFull                                                           +---------+---------------+---------+-----------+----------+-----------------+ PFV      Full                                                           +---------+---------------+---------+-----------+----------+-----------------+ POP      Full           Yes      Yes                                    +---------+---------------+---------+-----------+----------+-----------------+  PTV      Full                                                           +---------+---------------+---------+-----------+----------+-----------------+ PERO     Partial        Yes      Yes                  Age Indeterminate +---------+---------------+---------+-----------+----------+-----------------+ Gastroc  Full                                                           +---------+---------------+---------+-----------+----------+-----------------+    Summary: RIGHT: - Findings consistent with acute deep vein thrombosis involving the right gastrocnemius veins. - No cystic structure found in the popliteal fossa.  LEFT: - Findings consistent with age indeterminate deep vein thrombosis involving the left peroneal veins. - No cystic  structure found in the popliteal fossa.  *See table(s) above for measurements and observations.    Preliminary    US Abdomen Limited RUQ (LIVER/GB)  Result Date: 11/22/2022 CLINICAL DATA:  Elevated liver enzymes EXAM: ULTRASOUND ABDOMEN LIMITED RIGHT UPPER QUADRANT COMPARISON:  CT 11/21/2022 FINDINGS: Gallbladder: Distended gallbladder without wall thickening or pericholecystic fluid. No intraluminal gallstones identified. Negative sonographic Murphy sign. Common bile duct: Diameter: 3.9 mm, normal.  No intrahepatic ductal dilation. Liver: Increased liver echogenicity. No focal lesion identified. Portal vein is patent on color Doppler imaging with normal direction of blood flow towards the liver. Other: None. IMPRESSION: Hepatic steatosis. No evidence of cholecystitis or biliary obstruction. Electronically Signed   By: Maurine Simmering M.D.   On: 11/22/2022 10:30   DG C-Arm 1-60 Min-No Report  Result Date: 11/22/2022 Fluoroscopy was utilized by the requesting physician.  No radiographic interpretation.   CT CHEST ABDOMEN PELVIS WO CONTRAST  Result Date: 11/21/2022 CLINICAL DATA:  Sepsis.  History of breast cancer. EXAM: CT CHEST, ABDOMEN AND PELVIS WITHOUT CONTRAST TECHNIQUE: Multidetector CT imaging of the chest, abdomen and pelvis was performed following the standard protocol without IV contrast. RADIATION DOSE REDUCTION: This exam was performed according to the departmental dose-optimization program which includes automated exposure control, adjustment of the mA and/or kV according to patient size and/or use of iterative reconstruction technique. COMPARISON:  CT chest abdomen and pelvis 09/19/2017 FINDINGS: CT CHEST FINDINGS Cardiovascular: No significant vascular findings. Normal heart size. No pericardial effusion. There are atherosclerotic calcifications of the aorta. Mediastinum/Nodes: There is a 6 mm hypodense right thyroid nodule, unchanged. There are no enlarged mediastinal, hilar or axillary lymph  nodes. Lungs/Pleura: Mild emphysematous changes are seen in the lung apices. There is a small amount of atelectasis and airspace disease in the lingula. There is a trace left pleural effusion. There is dependent atelectasis in both lower lobes. Musculoskeletal: No acute fracture or focal osseous lesion. Left mastectomy changes are present. There surgical clips in the left axilla. CT ABDOMEN PELVIS FINDINGS Hepatobiliary: No focal liver abnormality is seen. No gallstones, gallbladder wall thickening, or biliary dilatation. Pancreas: There is inflammatory stranding surrounding the entire pancreas. There is no fluid collection or ductal dilatation. Spleen: Normal in size without focal abnormality.  Adrenals/Urinary Tract: There is bladder wall thickening with mild surrounding inflammatory stranding. There is bilateral perinephric fat stranding, left greater than right. There is also some hyperdensity and stranding in the left pararenal space. There is a 2 mm calculus in the proximal right ureter image 3/72 with mild right-sided hydronephrosis. There is no left-sided hydronephrosis. Left renal cysts are unchanged measuring up 2 4.5 cm. Stomach/Bowel: There is inflammatory stranding surrounding the duodenal and proximal small bowel as it approximates pancreas. There is also some wall thickening of the descending colon with mild surrounding inflammatory stranding. There is sigmoid colon diverticulosis without evidence for diverticulitis. The appendix is not seen. There is no pneumatosis, bowel obstruction or free air. Stomach is decompressed. Vascular/Lymphatic: Aorta and IVC are normal in size. There are atherosclerotic calcifications of the aorta. There is diffuse retroperitoneal/para-aortic inflammatory stranding which is new from prior. No discrete enlarged lymph nodes are identified. Reproductive: Status post hysterectomy. No adnexal masses. Other: There is a small fat containing umbilical hernia. There is mild body  wall edema. There is no ascites. Musculoskeletal: There are degenerative changes of the lower lumbar spine. IMPRESSION: 1. Diffuse inflammatory stranding surrounding the pancreas, duodenum, proximal small bowel, and descending colon. Findings are worrisome for acute pancreatitis. 2. Bladder wall thickening with surrounding inflammatory stranding worrisome for cystitis. 3. 2 mm calculus in the proximal right ureter with mild right-sided hydronephrosis. 4. Trace left pleural effusion. 5. Small amount of airspace disease in the lingula may represent atelectasis or infection. 6. New diffuse retroperitoneal stranding and left pararenal stranding of uncertain etiology. Findings may be related to retroperitoneal hemorrhage, infectious/inflammatory process, or less likely retroperitoneal fibrosis. 7. Mild body wall edema. Aortic Atherosclerosis (ICD10-I70.0) and Emphysema (ICD10-J43.9). Electronically Signed   By: Ronney Asters M.D.   On: 11/21/2022 23:29   DG Chest Port 1 View  Result Date: 11/21/2022 CLINICAL DATA:  Sepsis, shortness of breath.  Breast cancer. EXAM: PORTABLE CHEST 1 VIEW COMPARISON:  09/06/2022 FINDINGS: Reverse lordotic projection. Mild atherosclerotic vascular calcification of the thoracic aorta. Left axillary clips. Lingular scarring or atelectasis along the left heart border, similar to prior exams. IMPRESSION: 1. Lingular scarring or atelectasis along the left heart border, similar to prior exams. 2. Mild atherosclerotic vascular calcification of the thoracic aorta. Electronically Signed   By: Van Clines M.D.   On: 11/21/2022 18:28    Assessment: 89 y.o. female   Severe sepsis secondary to UTI and ureteral stone, post ureteral stent placement Acute blood loss anemia, probably secondary to anticoagulation Peripancreatic stranding concerning for acute pancreatitis Suspected aspiration pneumonia, with hypoxia AKI secondary to dehydration Metastatic breast cancer, on supportive  care Acute right lower extremity DVT and age-indeterminate her left lower extremity DVT Anxiety, depression    Plan:  -GUN a GI on board, patient is on broad antibiotics -I think her anemia is likely related to her anticoagulation, Eliquis has been held since admission.  I will check iron level tomorrow morning.  If it is low, please give IV iron.  I will also check hemolysis labs to rule out hemolysis. -Blood transfusion if hemoglobin less than 7.5 -Unfortunately she still has active DVT in bilateral lower extremities, she will need anticoagulation, probably low dose Eliquis due to her CKD, age and risk of bleeding. Alternatively IVC filter could be considered if GI think her risk of bleeding is high  -No plan to treat her metastatic breast cancer, given the low tumor burden, advanced age, and previous poor tolerance.  Her recent CT  abdomen pelvis showed no definitive new metastasis.  Her left chest wall disease is also stable on exam. -I spoke with her daughter [name] at the bedside, patient has required more care at home.  We discussed palliative care and hospice.  She is open to palliative care on discharge.  -I will f/u as needed. Please call me if anything I can help.    Truitt Merle, MD 11/22/2022  5:33 PM

## 2022-11-22 NOTE — Progress Notes (Signed)
Lower extremity venous bilateral study completed.  Preliminary results relayed to [place], MD.   See CV Proc for preliminary results report.   Darlin Coco, RDMS, RVT

## 2022-11-22 NOTE — Progress Notes (Addendum)
Patient currently OTF before RN received patient. Was told pt at MRI.   2100-Pt back on unit.

## 2022-11-22 NOTE — Anesthesia Preprocedure Evaluation (Addendum)
Anesthesia Evaluation  Patient identified by MRN, date of birth, ID band Patient awake    Reviewed: Allergy & Precautions, NPO status , Patient's Chart, lab work & pertinent test results  History of Anesthesia Complications Negative for: history of anesthetic complications  Airway Mallampati: II  TM Distance: >3 FB Neck ROM: Full    Dental  (+) Partial Upper, Dental Advisory Given   Pulmonary COPD, former smoker   breath sounds clear to auscultation       Cardiovascular hypertension, Pt. on medications (-) angina + CAD (2012 cath: non-obstructive) and + DVT   Rhythm:Regular Rate:Normal  '17 ECHO: EF 60-65%, normal LVF, Grade 1 DD, normal RVF, no significant valvular abnormalities   Neuro/Psych   Anxiety Depression    negative neurological ROS     GI/Hepatic Neg liver ROS,GERD  Medicated and Controlled,,  Endo/Other  negative endocrine ROS    Renal/GU Renal InsufficiencyRenal disease (stones)     Musculoskeletal negative musculoskeletal ROS (+)    Abdominal   Peds  Hematology  (+) Blood dyscrasia (hb 5.6: has received pRBC x2), anemia Eliquis: INR 2.2   Anesthesia Other Findings H/o breast cancer  Reproductive/Obstetrics                             Anesthesia Physical Anesthesia Plan  ASA: 3 and emergent  Anesthesia Plan: General   Post-op Pain Management: Ofirmev IV (intra-op)*   Induction: Intravenous  PONV Risk Score and Plan: 3 and Ondansetron, Dexamethasone and Treatment may vary due to age or medical condition  Airway Management Planned: LMA  Additional Equipment: None  Intra-op Plan:   Post-operative Plan:   Informed Consent: I have reviewed the patients History and Physical, chart, labs and discussed the procedure including the risks, benefits and alternatives for the proposed anesthesia with the patient or authorized representative who has indicated his/her  understanding and acceptance.     Dental advisory given  Plan Discussed with: CRNA and Surgeon  Anesthesia Plan Comments:         Anesthesia Quick Evaluation

## 2022-11-22 NOTE — Consult Note (Signed)
I have been asked to see the patient by Dr. Godfrey Pick, for evaluation and management of infected right ureteral stone.  History of present illness: 89 year old female who presented to the emergency department with 3 days of confusion, low-grade fever, dysuria and generalized abdominal pain.  She is also been having nausea.  She recently had an ankle injury and followed by DVT and was started on Eliquis.  This then triggered some intermittent melena as well.  She was seen by her primary care doctor earlier today for your TI.  They are concerned about the patient being dehydrated.  She went home and was told to hydrate, but subsequently confusion and worsening symptoms triggered her daughter to call the EMS.  In the emergency department the patient was found to have what appears to be a severe urinary tract infection.  She had a CT scan demonstrating lots of retroperitoneal stranding and worsening right hydronephrosis with a 2 mm proximal ureteral stone.  Her lactate was elevated.  Her creatinine was elevated and her hemoglobin is noted to be 5.  She was given blood in the emergency department and urology is consulted for further management of her's obstructing stone and fever.  Review of systems: A 12 point comprehensive review of systems was obtained and is negative unless otherwise stated in the history of present illness.  Patient Active Problem List   Diagnosis Date Noted   Acute deep vein thrombosis (DVT) of popliteal vein of right lower extremity ([place]) 09/27/2022   Family history of pancreatic cancer    Family history of prostate cancer    Family history of ovarian cancer    Family history of breast cancer    Family history of colon cancer    Chest pain 03/15/2016   Cancer of central portion of left female breast ([place]) 12/09/2015   Bronchitis, chronic obstructive w acute bronchitis ([place]) 03/16/2014   Bronchitis with bronchospasm 03/15/2014   CAD (coronary artery disease), non obstructive   05/06/2013   HYPERTENSION, BENIGN 12/23/2008   HEMORRHOIDS 03/13/2008   OTHER DYSPHAGIA 03/13/2008   Asthma, intrinsic 10/30/2007   G E R D 09/19/2007       Past Medical History:  Diagnosis Date   Anxiety    Blood in urine    CAD (coronary artery disease)    non obstructive by cath   Cancer [facility])    breast- left   COVID    Depression    Family history of breast cancer    Family history of colon cancer    Family history of ovarian cancer    Family history of pancreatic cancer    Family history of prostate cancer    GERD (gastroesophageal reflux disease)    Hypertension    Knee fracture    Personal history of radiation therapy 2017   TR (tricuspid regurgitation)    mild by Echo 12/2008 EF >55%    Past Surgical History:  Procedure Laterality Date   ABDOMINAL AORTAGRAM N/A 09/08/2011   Procedure: ABDOMINAL Maxcine Ham;  Surgeon: Troy Sine, MD;  Location: [facility] CATH LAB;  Service: Cardiovascular;  Laterality: N/A;   ABDOMINAL HYSTERECTOMY     BREAST EXCISIONAL BIOPSY Left 2017   X3   BREAST SURGERY  08-30-10   mastectomy   CARDIAC CATHETERIZATION  08/2011   20% LAD stenosis, 20% diagonal stenosis, 10-20% proximal dominant RCA stenosis   COLON SURGERY     LEFT HEART CATHETERIZATION WITH CORONARY ANGIOGRAM N/A 09/08/2011   Procedure: LEFT HEART CATHETERIZATION  WITH CORONARY ANGIOGRAM;  Surgeon: Troy Sine, MD;  Location: [facility] CATH LAB;  Service: Cardiovascular;  Laterality: N/A;   MASTECTOMY     MINOR BREAST BIOPSY Left 11/25/2015   Procedure: MINOR EXCISION MASS LEFT CHEST WALL;  Surgeon: Autumn Messing III, MD;  Location: [place];  Service: General;  Laterality: Left;   MINOR BREAST BIOPSY Left 07/17/2016   Procedure: EXCISION OF 2 LEFT CHEST WALL MASSES;  Surgeon: Autumn Messing III, MD;  Location: [place];  Service: General;  Laterality: Left;   TONSILLECTOMY      Social History   Tobacco Use   Smoking status: Former    Types: Cigarettes     Quit date: 11/15/1991    Years since quitting: 31.0   Smokeless tobacco: Never  Substance Use Topics   Alcohol use: Yes    Alcohol/week: 2.0 standard drinks of alcohol    Types: 2 Glasses of wine per week    Comment: 1 glass per week   Drug use: No    Family History  Problem Relation Age of Onset   Prostate cancer Father 52       metastatic   Heart disease Sister    Breast cancer Sister 59   Pancreatic cancer Brother        dx in his 59s   Colon cancer Maternal Grandmother        dx <50   Dementia Mother    Sudden death Brother    Heart attack Brother    Heart attack Brother    Arthritis Sister    Cancer Paternal Uncle        unknown type   Ovarian cancer Niece 67    PE: Vitals:   11/22/22 0000 11/22/22 0029 11/22/22 0030 11/22/22 0042  BP: (!) 99/56 (!) 95/53 (!) 95/53   Pulse: 83 78 78 80  Resp: '19 18 17 17  '$ Temp:  98.1 F (36.7 C)  (!) 97.5 F (36.4 C)  TempSrc:  Oral  Oral  SpO2: 98% 95% 98% 98%  Weight:      Height:       Patient appears to be in no acute distress  patient is alert and oriented x3 Atraumatic normocephalic head No cervical or supraclavicular lymphadenopathy appreciated No increased work of breathing, no audible wheezes/rhonchi Regular sinus rhythm/rate Abdomen is soft, nondistended, diffuse abdominal tenderness, right CVA tenderness Lower extremities are symmetric without appreciable edema Grossly neurologically intact No identifiable skin lesions  Recent Labs    11/21/22 1758  WBC 7.1  HGB 5.6*  HCT 18.4*   Recent Labs    11/21/22 1758  NA 136  K 3.8  CL 104  CO2 19*  GLUCOSE 140*  BUN 28*  CREATININE 1.41*  CALCIUM 8.7*   Recent Labs    11/21/22 1758  INR 2.2*   No results for input(s): "LABURIN" in the last 72 hours. Results for orders placed or performed during the hospital encounter of 11/21/22  Resp panel by RT-PCR (RSV, Flu A&B, Covid) Anterior Nasal Swab     Status: None   Collection Time: 11/21/22   6:08 PM   Specimen: Anterior Nasal Swab  Result Value Ref Range Status   SARS Coronavirus 2 by RT PCR NEGATIVE NEGATIVE Final    Comment: (NOTE) SARS-CoV-2 target nucleic acids are NOT DETECTED.  The SARS-CoV-2 RNA is generally detectable in upper respiratory specimens during the acute phase of infection. The lowest concentration of SARS-CoV-2 viral copies this  assay can detect is 138 copies/mL. A negative result does not preclude SARS-Cov-2 infection and should not be used as the sole basis for treatment or other patient management decisions. A negative result may occur with  improper specimen collection/handling, submission of specimen other than nasopharyngeal swab, presence of viral mutation(s) within the areas targeted by this assay, and inadequate number of viral copies(<138 copies/mL). A negative result must be combined with clinical observations, patient history, and epidemiological information. The expected result is Negative.  Fact Sheet for Patients:  EntrepreneurPulse.com.au  Fact Sheet for Healthcare Providers:  IncredibleEmployment.be  This test is no t yet approved or cleared by the [place] FDA and  has been authorized for detection and/or diagnosis of SARS-CoV-2 by FDA under an Emergency Use Authorization (EUA). This EUA will remain  in effect (meaning this test can be used) for the duration of the COVID-19 declaration under Section 564(b)(1) of the Act, 21 U.S.C.section 360bbb-3(b)(1), unless the authorization is terminated  or revoked sooner.       Influenza A by PCR NEGATIVE NEGATIVE Final   Influenza B by PCR NEGATIVE NEGATIVE Final    Comment: (NOTE) The Xpert Xpress SARS-CoV-2/FLU/RSV plus assay is intended as an aid in the diagnosis of influenza from Nasopharyngeal swab specimens and should not be used as a sole basis for treatment. Nasal washings and aspirates are unacceptable for Xpert Xpress  SARS-CoV-2/FLU/RSV testing.  Fact Sheet for Patients: EntrepreneurPulse.com.au  Fact Sheet for Healthcare Providers: IncredibleEmployment.be  This test is not yet approved or cleared by the [place] FDA and has been authorized for detection and/or diagnosis of SARS-CoV-2 by FDA under an Emergency Use Authorization (EUA). This EUA will remain in effect (meaning this test can be used) for the duration of the COVID-19 declaration under Section 564(b)(1) of the Act, 21 U.S.C. section 360bbb-3(b)(1), unless the authorization is terminated or revoked.     Resp Syncytial Virus by PCR NEGATIVE NEGATIVE Final    Comment: (NOTE) Fact Sheet for Patients: EntrepreneurPulse.com.au  Fact Sheet for Healthcare Providers: IncredibleEmployment.be  This test is not yet approved or cleared by the [place] FDA and has been authorized for detection and/or diagnosis of SARS-CoV-2 by FDA under an Emergency Use Authorization (EUA). This EUA will remain in effect (meaning this test can be used) for the duration of the COVID-19 declaration under Section 564(b)(1) of the Act, 21 U.S.C. section 360bbb-3(b)(1), unless the authorization is terminated or revoked.  Performed at [facility], [place] [address] [place], [place] [address]     Imaging: I independently reviewed the patient's CT scan demonstrating a 2 mm stone in the right proximal ureter with hydroureteronephrosis.  She has severe retroperitoneal stranding.  Imp: UTI, septic physiology, obstructing right ureteral stone  Recommendations: Recommended we proceed to the OR urgently for right ureteral stent placement so that we can get on top of her infection.  Once the infection is treated she will need an second procedure to remove the stone.  Ardis Hughs

## 2022-11-22 NOTE — Discharge Instructions (Signed)
DISCHARGE INSTRUCTIONS FOR KIDNEY STONE/URETERAL STENT   MEDICATIONS:  1.  Resume all your other meds from home - except do not take any extra narcotic pain meds that you may have at home.    ACTIVITY:  1. No strenuous activity x 1week  2. No driving while on narcotic pain medications  3. Drink plenty of water  4. Continue to walk at home - you can still get blood clots when you are at home, so keep active, but don't over do it.  5. May return to work/school tomorrow or when you feel ready   BATHING:  1. You can shower and we recommend daily showers  2. You have a string coming from your urethra: The stent string is attached to your ureteral stent. Do not pull on this.   SIGNS/SYMPTOMS TO CALL:  Please call us if you have a fever greater than 101.5, uncontrolled nausea/vomiting, uncontrolled pain, dizziness, unable to urinate, bloody urine, chest pain, shortness of breath, leg swelling, leg pain, redness around wound, drainage from wound, or any other concerns or questions.   You can reach [place] at [phone].   FOLLOW-UP:  1 We will contact you to schedule f/u with Dr. Louis Meckel

## 2022-11-22 NOTE — TOC Initial Note (Signed)
Transition of Care (TOC) - Initial/Assessment Note  Marvetta Gibbons RN,BSN Transitions of Care Unit 4NP (Non Trauma)- RN Case Manager See Treatment Team for direct Phone #   Patient Details  Name: Gloria [name] MRN: [id number] Date of Birth: 1927/11/12  Transition of Care [facility]) CM/SW Contact:    Dawayne Patricia, RN Phone Number: 11/22/2022, 2:29 PM  Clinical Narrative:                 Transition of Care Department [facility]) has reviewed patient, Pt admitted from home, note per PT eval pt is hopeful to progress to going home w/ [facility]- however may need SNF.  We will continue to monitor patient advancement through interdisciplinary progression rounds. If new patient transition needs arise, please place a TOC consult.  Expected Discharge Plan: [place] Barriers to Discharge: Continued Medical Work up   Patient Goals and CMS Choice            Expected Discharge Plan and Services    TBD- Home w/ [facility] vs SNF pending pt progress   Living arrangements for the past 2 months: Single Family Home                                      Prior Living Arrangements/Services Living arrangements for the past 2 months: Single Family Home Lives with:: Self                   Activities of Daily Living      Permission Sought/Granted                  Emotional Assessment              Admission diagnosis:  Melena [K92.1] Pyelonephritis [N12] Obstructive uropathy [N13.9] Acute respiratory failure with hypoxia (HCC) [J96.01] Symptomatic anemia [D64.9] Sepsis, due to unspecified organism, unspecified whether acute organ dysfunction present ([place]) [A41.9] UTI (urinary tract infection) [N39.0] Patient Active Problem List   Diagnosis Date Noted   UTI (urinary tract infection) 11/22/2022   Acute deep vein thrombosis (DVT) of popliteal vein of right lower extremity ([place]) 09/27/2022   Family history of pancreatic cancer    Family history of prostate  cancer    Family history of ovarian cancer    Family history of breast cancer    Family history of colon cancer    Chest pain 03/15/2016   Cancer of central portion of left female breast ([place]) 12/09/2015   Bronchitis, chronic obstructive w acute bronchitis ([place]) 03/16/2014   Bronchitis with bronchospasm 03/15/2014   CAD (coronary artery disease), non obstructive  05/06/2013   HYPERTENSION, BENIGN 12/23/2008   HEMORRHOIDS 03/13/2008   OTHER DYSPHAGIA 03/13/2008   Asthma, intrinsic 10/30/2007   G E R D 09/19/2007   PCP:  Charlane Ferretti, MD Pharmacy:   [id number] [place], [place] [address] Phone: [phone] Fax: [place], [place] [address] Phone: [phone] Fax: [place] [address] Phone: [phone] Fax: [phone]  [facility], [place] [address] [place] [address] Phone: [phone] Fax: [phone]  [place] [address]  [place] [address] Phone: [phone] Fax: [phone]     Social Determinants of Health (SDOH) Social History: SDOH Screenings   Tobacco Use: Medium Risk (11/21/2022)   SDOH Interventions:     Readmission Risk Interventions     No data to display

## 2022-11-22 NOTE — Telephone Encounter (Signed)
Pt's daughter called stating that the pt was taken to the hospital last night and was admitted.  [name] (pt's daughter) stated that the pt had emergency surgery.  [name] also stated that the pt's appt on 11/24/2022 will need to be rescheduled d/t pt's admission.  [name] wanted to know if [name] was aware of the pt's admission.  Informed Sandy that [name] was not aware but this RN will notify [name].  Stated that [name] usually does rounds on her pt's admitted to [facility] after [name] clinic closes.  [name] stated she will be with her mom when [name] arrives.

## 2022-11-22 NOTE — H&P (Signed)
[facility] History and Physical  Gloria [name] QBH:[id number] DOB: 07-22-1928 DOA: 11/21/2022 PCP: Charlane Ferretti, MD  Admitted from: Home Chief Complaint: Confusion, dehydration  History of Present Illness: Gloria [name] is a 89 y.o. female with PMH significant for HTN, nonobstructive CAD, left breast cancer, anxiety/depression who recently had ankle injury and subsequently developed DVT and was started on Eliquis.  This was complicated by intermittent melena but she remained on Eliquis. 1/23, patient's daughter took her to PCP with complaint of increased urinary frequency, dehydration and worsening confusion for 3 days.  She was sent home and suggested to increase hydration.  At home, her confusion worsened and EMS was called.  EMS noted her to have shortness of breath and wheezing all over.  En route, she received albuterol and Solu-Medrol.  In the ED, she had a fever of 102.9, heart rate in 130s, blood pressure 120s, required 2 L oxygen by nasal cannula She also complained of urinary urgency, frequency, dysuria and abdominal pain.  Labs showed elevated BNP at 129, troponin normal, WBC normal at 7.1, hemoglobin low at 5.6, platelet normal at 227, lactic acid elevated to 3.6, BUN/creatinine elevated to 28/1.41, INR 2.2 Respiratory virus panel negative for flu, RSV, COVID Chest x-ray unremarkable. FOBT positive Urinalysis showed yellow hazy color urine with moderate amount of blood, moderate leukocytes, positive nitrate and many bacteria  CT chest, abdomen pelvis showed 1. Diffuse inflammatory stranding surrounding the pancreas, duodenum, proximal small bowel, and descending colon. Findings are worrisome for acute pancreatitis. 2. Bladder wall thickening with surrounding inflammatory stranding worrisome for cystitis. 3. 2 mm calculus in the proximal right ureter with mild right-sided hydronephrosis. 4. Trace left pleural effusion. 5. Small amount of airspace disease in the lingula  may represent atelectasis or infection. 6. New diffuse retroperitoneal stranding and left pararenal stranding of uncertain etiology. Findings may be related to retroperitoneal hemorrhage, infectious/inflammatory process, or less likely retroperitoneal fibrosis. 7. Mild body wall edema.  Blood culture and urine culture were sent. Patient was started on broad-spectrum IV antibiotics. Patient was seen by urology. She underwent emergent cystoscopy, right ureteral stent placement. Postprocedure, she was also seen by PCCM.  She did not require transfer to ICU. Admitted to hospitalist service  Overnight, no recurrence of fever, hemodynamically stable on 2 L oxygen. Last set of labs from this morning with normal WBC count, lactic acid downtrending to 2.3, hemoglobin improved to 9.1  At the time of my evaluation this morning, patient was propped up in bed.  Not in distress.  Her son was at bedside.  Review of Systems:  All systems were reviewed and were negative unless otherwise mentioned in the HPI   Past medical history: Past Medical History:  Diagnosis Date   Anxiety    Blood in urine    CAD (coronary artery disease)    non obstructive by cath   Cancer [facility])    breast- left   COVID    Depression    Family history of breast cancer    Family history of colon cancer    Family history of ovarian cancer    Family history of pancreatic cancer    Family history of prostate cancer    GERD (gastroesophageal reflux disease)    Hypertension    Knee fracture    Personal history of radiation therapy 2017   TR (tricuspid regurgitation)    mild by Echo 12/2008 EF >55%    Past surgical history: Past Surgical History:  Procedure Laterality  Date   ABDOMINAL AORTAGRAM N/A 09/08/2011   Procedure: ABDOMINAL Maxcine Ham;  Surgeon: Troy Sine, MD;  Location: [facility] CATH LAB;  Service: Cardiovascular;  Laterality: N/A;   ABDOMINAL HYSTERECTOMY     BREAST EXCISIONAL BIOPSY Left 2017   X3   BREAST  SURGERY  08-30-10   mastectomy   CARDIAC CATHETERIZATION  08/2011   20% LAD stenosis, 20% diagonal stenosis, 10-20% proximal dominant RCA stenosis   COLON SURGERY     LEFT HEART CATHETERIZATION WITH CORONARY ANGIOGRAM N/A 09/08/2011   Procedure: LEFT HEART CATHETERIZATION WITH CORONARY ANGIOGRAM;  Surgeon: Troy Sine, MD;  Location[facility];  Service: Cardiovascular;  Laterality: N/A;   MASTECTOMY     MINOR BREAST BIOPSY Left 11/25/2015   Procedure: MINOR EXCISION MASS LEFT CHEST WALL;  Surgeon: Autumn Messing III, MD;  Location: [place];  Service: General;  Laterality: Left;   MINOR BREAST BIOPSY Left 07/17/2016   Procedure: EXCISION OF 2 LEFT CHEST WALL MASSES;  Surgeon: Autumn Messing III, MD;  Location: [place];  Service: General;  Laterality: Left;   TONSILLECTOMY      Social History:  reports that she quit smoking about 31 years ago. Her smoking use included cigarettes. She has never used smokeless tobacco. She reports current alcohol use of about 2.0 standard drinks of alcohol per week. She reports that she does not use drugs.  Allergies:  Allergies  Allergen Reactions   Contrast Media [Iodinated Contrast Media] Anaphylaxis    09/19/17-Pt premedicated. Then pt stated she was DNR. Spoke with [name] who did not realize allergy was recorded as anaphylactic. [name] verbally gave ok to change CT exam to without IV contrast. Pt and daughter do not recall occurrence of anaphylactic reaction to contrast media. Geanie Kenning, 3:27pm   Penicillins Anaphylaxis and Swelling    Has patient had a PCN reaction causing immediate rash, facial/tongue/throat swelling, SOB or lightheadedness with hypotension: Yes Has patient had a PCN reaction causing severe rash involving mucus membranes or skin necrosis: No Has patient had a PCN reaction that required hospitalization Yes Has patient had a PCN reaction occurring within the last 10 years: No If all of the above answers  are "NO", then may proceed with Cephalosporin use.    Adhesive [Tape] Other (See Comments)    blisters   Cephalexin Swelling   Ciprofloxacin Swelling   Demerol Nausea And Vomiting   Meperidine Hcl    Sulfa Antibiotics    Sulfamethoxazole-Trimethoprim Itching and Swelling    REACTION: swelling/hives   Quinolones Itching and Rash    REACTION: itching, rash Pt. Reports no problems with levaquin   Contrast media [iodinated contrast media], Penicillins, Adhesive [tape], Cephalexin, Ciprofloxacin, Demerol, Meperidine hcl, Sulfa antibiotics, Sulfamethoxazole-trimethoprim, and Quinolones   Family history:  Family History  Problem Relation Age of Onset   Prostate cancer Father 59       metastatic   Heart disease Sister    Breast cancer Sister 59   Pancreatic cancer Brother        dx in his 68s   Colon cancer Maternal Grandmother        dx <50   Dementia Mother    Sudden death Brother    Heart attack Brother    Heart attack Brother    Arthritis Sister    Cancer Paternal Uncle        unknown type   Ovarian cancer Niece 39     Home Meds: Prior  to Admission medications   Medication Sig Start Date End Date Taking? Authorizing Provider  ondansetron (ZOFRAN) 4 MG tablet TAKE 1 TABLET BY MOUTH EVERY 8 HOURS AS NEEDED FOR NAUSEA OR VOMITING. Patient taking differently: Take 4 mg by mouth every 8 (eight) hours as needed for nausea or vomiting. 11/16/22  Yes Truitt Merle, MD  acetaminophen (TYLENOL) 500 MG tablet Take 1,000 mg by mouth daily as needed for moderate pain. Reported on 03/10/2016    [provider]  ALPRAZolam Duanne Moron) 0.25 MG tablet Take by mouth.    [provider]  apixaban (ELIQUIS) 5 MG TABS tablet Take 1 tablet (5 mg total) by mouth 2 (two) times daily. Start taking after completion of starter pack. 09/27/22   Rebbeca Paul B, RPH-CPP  doxepin (SINEQUAN) 10 MG capsule Take 10 mg by mouth at bedtime.    [provider]  famotidine (PEPCID) 20 MG  tablet Take 20 mg by mouth daily.    [provider]  FLOVENT HFA 44 MCG/ACT inhaler Inhale into the lungs. 11/20/20   [provider]  fluticasone (FLONASE) 50 MCG/ACT nasal spray Place 1 spray into both nostrils 2 (two) times daily.    [provider]  guaiFENesin (MUCINEX) 600 MG 12 hr tablet Take 600 mg by mouth daily as needed for cough or to loosen phlegm. Reported on 03/28/2016    [provider]  hydrochlorothiazide (MICROZIDE) 12.5 MG capsule TAKE 1 CAPSULE BY MOUTH ONCE DAILYAPPOINTMENT REQUIRED FOR FUTURE REFILLS 10/31/22   Troy Sine, MD  losartan (COZAAR) 25 MG tablet Take 25 mg by mouth daily. Takes 50 mg in the morning and 25 mg in the evening. 10/20/22   [provider]  losartan (COZAAR) 50 MG tablet Take 1 tablet by mouth once daily 11/15/22   Troy Sine, MD  nitroGLYCERIN (NITROSTAT) 0.4 MG SL tablet Place 1 tablet (0.4 mg total) under the tongue every 5 (five) minutes as needed for chest pain. <PLEASE MAKE APPOINTMENT> 02/25/16   Troy Sine, MD  pseudoephedrine-acetaminophen (TYLENOL SINUS) 30-500 MG TABS tablet Take 1 tablet by mouth every 4 (four) hours as needed.    [provider]    Physical Exam: Vitals:   11/22/22 5638 11/22/22 0641 11/22/22 0849 11/22/22 1155  BP: 125/75 (!) 119/56 128/76 125/71  Pulse: 67 70 69 62  Resp: 19 18 (!) 23 19  Temp: 98 F (36.7 C) 98 F (36.7 C) 97.6 F (36.4 C) (!) 97.5 F (36.4 C)  TempSrc:  Oral Oral Oral  SpO2: 96% 96% 97% 95%  Weight:      Height:       Wt Readings from Last 3 Encounters:  11/21/22 57.6 kg  09/30/22 57.6 kg  09/27/22 59 kg   Body mass index is 24.8 kg/m.  General exam: Pleasant, elderly Caucasian female.  Not in distress. Skin: No rashes, lesions or ulcers. HEENT: Atraumatic, normocephalic, no obvious bleeding Lungs: Diminished air entry in both bases, otherwise clear to auscultation bilaterally CVS: Regular rate and rhythm, no  murmur GI/Abd soft, nondistended, mild tenderness present in epigastrium, bowel sound present, CNS: Alert, awake, oriented x 3.  Slow to respond Psychiatry: Sad affect Extremities: Mild edema both legs.     Consult Orders  (From admission, onward)           Start     Ordered   11/23/22 0700  OT eval and treat  Start Tomorrow        11/22/22 1044  11/22/22 0825  PT eval and treat  Routine        11/22/22 0824   11/22/22 0021  Consult to hospitalist  Paged by [facility]       Provider:  (Not yet assigned)  Question Answer Comment  Place call to: [facility]   Reason for Consult Admit      11/22/22 0020            Labs on Admission:   CBC: Recent Labs  Lab 11/21/22 1758 11/22/22 0204 11/22/22 0500  WBC 7.1  --  6.3  NEUTROABS 6.0  --   --   HGB 5.6* 9.2* 9.1*  HCT 18.4* 27.0* 28.1*  MCV 90.6  --  91.5  PLT 227  --  062    Basic Metabolic Panel: Recent Labs  Lab 11/21/22 1758 11/22/22 0204 11/22/22 0500  NA 136 135 136  K 3.8 3.7 3.8  CL 104 104 104  CO2 19*  --  20*  GLUCOSE 140* 172* 184*  BUN 28* 23 25*  CREATININE 1.41* 1.10* 1.47*  CALCIUM 8.7*  --  8.2*  MG 1.7  --  1.8  PHOS  --   --  4.2    Liver Function Tests: Recent Labs  Lab 11/21/22 1758 11/22/22 0500  AST 31 45*  ALT 12 18  ALKPHOS 74 71  BILITOT 0.8 1.3*  PROT 5.9* 5.5*  ALBUMIN 3.3* 2.8*   Recent Labs  Lab 11/22/22 0041  LIPASE 26   No results for input(s): "AMMONIA" in the last 168 hours.  Cardiac Enzymes: No results for input(s): "CKTOTAL", "CKMB", "CKMBINDEX", "TROPONINI" in the last 168 hours.  BNP (last 3 results) Recent Labs    11/21/22 1756  BNP 129.1*    ProBNP (last 3 results) No results for input(s): "PROBNP" in the last 8760 hours.  CBG: No results for input(s): "GLUCAP" in the last 168 hours.  Lipase     Component Value Date/Time   LIPASE 26 11/22/2022 0041     Urinalysis    Component Value Date/Time   COLORURINE  YELLOW 11/21/2022 2253   APPEARANCEUR HAZY (A) 11/21/2022 2253   LABSPEC 1.029 11/21/2022 2253   LABSPEC 1.020 01/23/2017 1406   PHURINE 5.0 11/21/2022 2253   GLUCOSEU NEGATIVE 11/21/2022 2253   GLUCOSEU Negative 01/23/2017 1406   HGBUR MODERATE (A) 11/21/2022 2253   BILIRUBINUR NEGATIVE 11/21/2022 2253   BILIRUBINUR Negative 01/23/2017 1406   KETONESUR 5 (A) 11/21/2022 2253   PROTEINUR 100 (A) 11/21/2022 2253   UROBILINOGEN 0.2 01/23/2017 1406   NITRITE POSITIVE (A) 11/21/2022 2253   LEUKOCYTESUR MODERATE (A) 11/21/2022 2253   LEUKOCYTESUR Trace 01/23/2017 1406     Drugs of Abuse  No results found for: "LABOPIA", "COCAINSCRNUR", "LABBENZ", "AMPHETMU", "THCU", "LABBARB"    Radiological Exams on Admission: US Abdomen Limited RUQ (LIVER/GB)  Result Date: 11/22/2022 CLINICAL DATA:  Elevated liver enzymes EXAM: ULTRASOUND ABDOMEN LIMITED RIGHT UPPER QUADRANT COMPARISON:  CT 11/21/2022 FINDINGS: Gallbladder: Distended gallbladder without wall thickening or pericholecystic fluid. No intraluminal gallstones identified. Negative sonographic Murphy sign. Common bile duct: Diameter: 3.9 mm, normal.  No intrahepatic ductal dilation. Liver: Increased liver echogenicity. No focal lesion identified. Portal vein is patent on color Doppler imaging with normal direction of blood flow towards the liver. Other: None. IMPRESSION: Hepatic steatosis. No evidence of cholecystitis or biliary obstruction. Electronically Signed   By: Maurine Simmering M.D.   On: 11/22/2022 10:30   DG C-Arm 1-60 Min-No Report  Result  Date: 11/22/2022 Fluoroscopy was utilized by the requesting physician.  No radiographic interpretation.   CT CHEST ABDOMEN PELVIS WO CONTRAST  Result Date: 11/21/2022 CLINICAL DATA:  Sepsis.  History of breast cancer. EXAM: CT CHEST, ABDOMEN AND PELVIS WITHOUT CONTRAST TECHNIQUE: Multidetector CT imaging of the chest, abdomen and pelvis was performed following the standard protocol without IV contrast.  RADIATION DOSE REDUCTION: This exam was performed according to the departmental dose-optimization program which includes automated exposure control, adjustment of the mA and/or kV according to patient size and/or use of iterative reconstruction technique. COMPARISON:  CT chest abdomen and pelvis 09/19/2017 FINDINGS: CT CHEST FINDINGS Cardiovascular: No significant vascular findings. Normal heart size. No pericardial effusion. There are atherosclerotic calcifications of the aorta. Mediastinum/Nodes: There is a 6 mm hypodense right thyroid nodule, unchanged. There are no enlarged mediastinal, hilar or axillary lymph nodes. Lungs/Pleura: Mild emphysematous changes are seen in the lung apices. There is a small amount of atelectasis and airspace disease in the lingula. There is a trace left pleural effusion. There is dependent atelectasis in both lower lobes. Musculoskeletal: No acute fracture or focal osseous lesion. Left mastectomy changes are present. There surgical clips in the left axilla. CT ABDOMEN PELVIS FINDINGS Hepatobiliary: No focal liver abnormality is seen. No gallstones, gallbladder wall thickening, or biliary dilatation. Pancreas: There is inflammatory stranding surrounding the entire pancreas. There is no fluid collection or ductal dilatation. Spleen: Normal in size without focal abnormality. Adrenals/Urinary Tract: There is bladder wall thickening with mild surrounding inflammatory stranding. There is bilateral perinephric fat stranding, left greater than right. There is also some hyperdensity and stranding in the left pararenal space. There is a 2 mm calculus in the proximal right ureter image 3/72 with mild right-sided hydronephrosis. There is no left-sided hydronephrosis. Left renal cysts are unchanged measuring up 2 4.5 cm. Stomach/Bowel: There is inflammatory stranding surrounding the duodenal and proximal small bowel as it approximates pancreas. There is also some wall thickening of the descending  colon with mild surrounding inflammatory stranding. There is sigmoid colon diverticulosis without evidence for diverticulitis. The appendix is not seen. There is no pneumatosis, bowel obstruction or free air. Stomach is decompressed. Vascular/Lymphatic: Aorta and IVC are normal in size. There are atherosclerotic calcifications of the aorta. There is diffuse retroperitoneal/para-aortic inflammatory stranding which is new from prior. No discrete enlarged lymph nodes are identified. Reproductive: Status post hysterectomy. No adnexal masses. Other: There is a small fat containing umbilical hernia. There is mild body wall edema. There is no ascites. Musculoskeletal: There are degenerative changes of the lower lumbar spine. IMPRESSION: 1. Diffuse inflammatory stranding surrounding the pancreas, duodenum, proximal small bowel, and descending colon. Findings are worrisome for acute pancreatitis. 2. Bladder wall thickening with surrounding inflammatory stranding worrisome for cystitis. 3. 2 mm calculus in the proximal right ureter with mild right-sided hydronephrosis. 4. Trace left pleural effusion. 5. Small amount of airspace disease in the lingula may represent atelectasis or infection. 6. New diffuse retroperitoneal stranding and left pararenal stranding of uncertain etiology. Findings may be related to retroperitoneal hemorrhage, infectious/inflammatory process, or less likely retroperitoneal fibrosis. 7. Mild body wall edema. Aortic Atherosclerosis (ICD10-I70.0) and Emphysema (ICD10-J43.9). Electronically Signed   By: Ronney Asters M.D.   On: 11/21/2022 23:29   DG Chest Port 1 View  Result Date: 11/21/2022 CLINICAL DATA:  Sepsis, shortness of breath.  Breast cancer. EXAM: PORTABLE CHEST 1 VIEW COMPARISON:  09/06/2022 FINDINGS: Reverse lordotic projection. Mild atherosclerotic vascular calcification of the thoracic aorta. Left axillary clips.  Lingular scarring or atelectasis along the left heart border, similar to  prior exams. IMPRESSION: 1. Lingular scarring or atelectasis along the left heart border, similar to prior exams. 2. Mild atherosclerotic vascular calcification of the thoracic aorta. Electronically Signed   By: Van Clines M.D.   On: 11/21/2022 18:28     ------------------------------------------------------------------------------------------------------ Assessment/Plan: Principal Problem:   UTI (urinary tract infection)  Severe sepsis secondary to UTI - POA Obstructive right ureterolithiasis with mild hydronephrosis Hematuria Presented with dysuria, frequency, dehydration, worsening confusion Urinalysis showed yellow hazy color urine with moderate amount of blood, moderate leukocytes, positive nitrate and many bacteria CT scan showed 2 mm proximal right ureteral calculus with mild right-sided hydronephrosis, bladder wall thickening. S/p emergent right ureteral stent placement -1/23 Cultures sent. Currently on IV aztreonam and IV vancomycin. (Multiple allergies listed) Urology to follow No recurrence of fever since his stent placement.  WBC count normal.  Lactic acid downtrending.   Remains normotensive.  Continue maintenance IV fluid for now at a lower rate.  Continue to monitor. Recent Labs  Lab 11/21/22 1758 11/21/22 1948 11/22/22 0041 11/22/22 0500  WBC 7.1  --   --  6.3  LATICACIDVEN 3.6* 5.7* 2.3*  --   PROCALCITON  --   --   --  26.72   Acute blood loss anemia Presented with hemoglobin of 5.6. Per history, patient was recently started on Eliquis after DVT following an ankle injury.  She has noticed intermittent melena since then but continued Eliquis. FOBT positive in the ED CT scan also showed diffuse retroperitoneal stranding and left pararenal standing suspicious for retroperitoneal hemorrhage. Currently Eliquis on hold. GI consulted, tentative plan of endoscopy in 1/25 Currently n.p.o. and on IV PPI twice daily Received 2 units of PRBC transfusion so  far Hemoglobin trend as below, 9.1 this morning. Recent Labs    08/25/22 1157 09/30/22 0940 11/21/22 1758 11/22/22 0204 11/22/22 0500  HGB 11.5* 11.0* 5.6* 9.2* 9.1*  MCV 96.6 98.5 90.6  --  91.5   Peripancreatic stranding concerning for acute pancreatitis CT scan showed diffuse inflammatory stranding surrounding the pancreas, duodenum, proximal small bowel, and descending colon. Findings are worrisome for acute pancreatitis. However, lipase level is low.  Mildly elevated AST and total bili. Obtain right upper quadrant ultrasound. Patient remains n.p.o. anyway for other reason. Recent Labs  Lab 11/21/22 1758 11/22/22 0041 11/22/22 0500  AST 31  --  45*  ALT 12  --  18  ALKPHOS 74  --  71  BILITOT 0.8  --  1.3*  PROT 5.9*  --  5.5*  ALBUMIN 3.3*  --  2.8*  INR 2.2*  --  1.9*  LIPASE  --  26  --   PLT 227  --  151   Suspected aspiration pneumonia Acute respiratory failure with hypoxia EMS noted shortness of breath, wheezing given albuterol and Solu-Medrol. CT chest showed airspace disease in lingula. Suspect aspiration pneumonia related to confusion.  Will obtain speech eval. Dyspnea likely from combination of pneumonia and severe anemia. Currently on 2 L oxygen by nasal cannula. Continue to monitor.  Check ambulatory oxygen requirement Continue bronchodilators as needed  AKI Baseline creatinine less than 1.2.  Creatinine this morning was 1.47.  Related to sepsis. Continue to monitor on IV hydration Recent Labs    02/13/22 1410 03/16/22 1402 04/13/22 1055 05/19/22 1252 06/22/22 1143 08/25/22 1157 09/30/22 0940 11/21/22 1758 11/22/22 0204 11/22/22 0500  BUN 23 28* 26* 23 28* 26* 21 28* 23 25*  CREATININE 1.07* 1.21* 1.12* 1.16* 1.15* 1.25* 1.10* 1.41* 1.10* 1.47*   Essential hypertension PTA on HCTZ 12.5 mg daily, losartan twice daily (50 mg a.m. and 25 mg p.m.) Currently BP meds on hold.  Recent ankle injury followed by DVT Eliquis on hold Pain control  with Tylenol as needed PT eval ordered  Anxiety/depression PTA on Xanax, doxepin   History of left breast cancer S/p radiation.  Mobility: PT eval ordered  Goals of care   Code Status: Full Code    DVT prophylaxis: If ultrasound duplex is negative, will order SCDs.    Antimicrobials: IV aztreonam Fluid: NS at 75 mill per hour Consultants: GI, PCCM, urology Family Communication: Son at bedside  Diet: Diet Order             Diet clear liquid Room service appropriate? Yes; Fluid consistency: Thin  Diet effective now                    Nutritional status:  Body mass index is 24.8 kg/m.       Dispo: The patient is from: Home              Anticipated d/c is to: Home        ------------------------------------------------------------------------------------- Severity of Illness: The appropriate patient status for this patient is INPATIENT. Inpatient status is judged to be reasonable and necessary in order to provide the required intensity of service to ensure the patient's safety. The patient's presenting symptoms, physical exam findings, and initial radiographic and laboratory data in the context of their chronic comorbidities is felt to place them at high risk for further clinical deterioration. Furthermore, it is not anticipated that the patient will be medically stable for discharge from the hospital within 2 midnights of admission.   * I certify that at the point of admission it is my clinical judgment that the patient will require inpatient hospital care spanning beyond 2 midnights from the point of admission due to high intensity of service, high risk for further deterioration and high frequency of surveillance required.*   Signed, Terrilee Croak, MD [facility] 11/22/2022

## 2022-11-22 NOTE — Progress Notes (Signed)
Notified Dr. Louis Meckel  of blood clots in foley when patient arrived to PACU. Verbal order to give 20 mg iv lasix x1 and flush foley.

## 2022-11-22 NOTE — Consult Note (Signed)
NAME:  Gloria [name], MRN:  [id number], DOB:  July 05, 1928, LOS: 0 ADMISSION DATE:  11/21/2022, CONSULTATION DATE:  1/24 REFERRING MD:  Dr Marlowe Sax [facility], CHIEF COMPLAINT: sepsis   History of Present Illness:  89 year old female with past medical history as below, which is significant for GERD, hypertension, and urinary tract infection.  Also recently injured her ankle and subsequently developed a DVT which is currently being treated with Eliquis.  This was complicated by intermittent melena but she did remain on Eliquis.  She presented to her primary care physician on 1/23 with concern for UTI and was instructed to go home and hydrate.  Confusion worsened and EMS was called.  Upon EMS arrival the patient was noted to be short of breath and wheezing.  She complained of urinary urgency and frequency to the ED attending as well as dysuria and nausea.  Also newer onset of abdominal pain on the day of presentation.  She was started on empiric antibiotics for UTI.  A CT of the abdomen and pelvis demonstrated an obstructing right ureteral stone, peripancreatic stranding, and retroperitoneal stranding.  Initial lactic acid was 3.6 and increased to 5.7.  Laboratory evaluation also significant for hemoglobin 5.6 down from 11 approximately 2 months prior.  Gastroenterology was consulted regarding hemoglobin drop and melena and are tentatively planning for EGD 1/25.  Urology was consulted for obstructing stone and has decided to take the patient to the operating room for stent placement.  PCCM was consulted in the setting acute blood loss anemia and elevated lactic acid.  Pertinent  Medical History   has a past medical history of Anxiety, Blood in urine, CAD (coronary artery disease), Cancer ([place]), COVID, Depression, Family history of breast cancer, Family history of colon cancer, Family history of ovarian cancer, Family history of pancreatic cancer, Family history of prostate cancer, GERD (gastroesophageal reflux  disease), Hypertension, Knee fracture, Personal history of radiation therapy (2017), and TR (tricuspid regurgitation).   Significant Hospital Events: Including procedures, antibiotic start and stop dates in addition to other pertinent events   1/23 admit for sepsis/anemia  Interim History / Subjective:    Objective   Blood pressure 128/74, pulse 66, temperature 99.1 F (37.3 C), resp. rate (!) 22, height 5' (1.524 m), weight 57.6 kg, SpO2 100 %.        Intake/Output Summary (Last 24 hours) at 11/22/2022 0339 Last data filed at 11/22/2022 0235 Gross per 24 hour  Intake 2420.67 ml  Output 25 ml  Net 2395.67 ml   Filed Weights   11/21/22 1743  Weight: 57.6 kg    Examination: General: Elderly female in AND HENT: [place]/AT, PERRL, no JVD. Hard of hearing.  Lungs: Clear bilateral breath sounds Cardiovascular: RRR, no MRG Abdomen:  Soft, non-tender, non-distended Extremities: No acute deformity or ROM limitation.  Neuro: Examined shortly after anesthesia, but she is awake, alert, and oriented. Non-focal.  GU: Foley draining bloody urine.   Resolved Hospital Problem list     Assessment & Plan:   Sepsis secondary to urinary tract infection:  Obstructing ureteral stone with right sided hydronephrosis Hematuria - stent placed by urology 1/24 - empiric abx per primary - Culture urine, blood - Foley per urology - holding eliquis - Normotensive post op > repeat lactic acid down to 2.3  Acute blood loss anemia secondary: to GI bleed on Eliquis. Question of retroperitoneal bleeding on CT abdomen 1/23, however, stranding more likely r/t infection. Hemoglobin 5.6 on admission. Was 11 about 2 months prior.  -  Hold Eliquis - Repeat hemoglobin 9.2, repeat with AM labs - Transfuse for hemoglobin less than 7 - GI consulted and tentatively planning for endoscopy on 1/25. - PPI BID  ? CAP vs aspiration - ABX as above  Peripancreatic stranding concerning for pancreatitis - lipase only  20 - bowel rest > NPO for other reasons .  HTN - holding home antihypertensives   Best Practice (right click and "Reselect all SmartList Selections" daily)   Diet/type: NPO DVT prophylaxis: SCD GI prophylaxis: PPI Lines: N/A Foley:  Yes, and it is still needed Code Status:  per primary no GOC yet. Patient is groggy post anesthesia currently.  Last date of multidisciplinary goals of care discussion '[ ]'$   Labs   CBC: Recent Labs  Lab 11/21/22 1758 11/22/22 0204  WBC 7.1  --   NEUTROABS 6.0  --   HGB 5.6* 9.2*  HCT 18.4* 27.0*  MCV 90.6  --   PLT 227  --     Basic Metabolic Panel: Recent Labs  Lab 11/21/22 1758 11/22/22 0204  NA 136 135  K 3.8 3.7  CL 104 104  CO2 19*  --   GLUCOSE 140* 172*  BUN 28* 23  CREATININE 1.41* 1.10*  CALCIUM 8.7*  --   MG 1.7  --    GFR: Estimated Creatinine Clearance: 25.9 mL/min (A) (by C-G formula based on SCr of 1.1 mg/dL (H)). Recent Labs  Lab 11/21/22 1758 11/21/22 1948 11/22/22 0041  WBC 7.1  --   --   LATICACIDVEN 3.6* 5.7* 2.3*    Liver Function Tests: Recent Labs  Lab 11/21/22 1758  AST 31  ALT 12  ALKPHOS 74  BILITOT 0.8  PROT 5.9*  ALBUMIN 3.3*   Recent Labs  Lab 11/22/22 0041  LIPASE 26   No results for input(s): "AMMONIA" in the last 168 hours.  ABG    Component Value Date/Time   HCO3 28.6 (H) 11/19/2007 0751   TCO2 20 (L) 11/22/2022 0204     Coagulation Profile: Recent Labs  Lab 11/21/22 1758  INR 2.2*    Cardiac Enzymes: No results for input(s): "CKTOTAL", "CKMB", "CKMBINDEX", "TROPONINI" in the last 168 hours.  HbA1C: Hgb A1c MFr Bld  Date/Time Value Ref Range Status  07/23/2009 10:50 AM  4.6 - 6.1 % Final   5.2 (NOTE) The ADA recommends the following therapeutic goal for glycemic control related to Hgb A1c measurement: Goal of therapy: <6.5 Hgb A1c  Reference: American Diabetes Association: Clinical Practice Recommendations 2010, Diabetes Care, 2010, 33: (Suppl  1).     CBG: No results for input(s): "GLUCAP" in the last 168 hours.  Review of Systems:   Patient is encephalopathic and/or intubated. Therefore history has been obtained from chart review.    Past Medical History:  She,  has a past medical history of Anxiety, Blood in urine, CAD (coronary artery disease), Cancer ([place]), COVID, Depression, Family history of breast cancer, Family history of colon cancer, Family history of ovarian cancer, Family history of pancreatic cancer, Family history of prostate cancer, GERD (gastroesophageal reflux disease), Hypertension, Knee fracture, Personal history of radiation therapy (2017), and TR (tricuspid regurgitation).   Surgical History:   Past Surgical History:  Procedure Laterality Date   ABDOMINAL AORTAGRAM N/A 09/08/2011   Procedure: ABDOMINAL AORTAGRAM;  Surgeon: Troy Sine, MD;  Location: [facility] CATH LAB;  Service: Cardiovascular;  Laterality: N/A;   ABDOMINAL HYSTERECTOMY     BREAST EXCISIONAL BIOPSY Left 2017   X3   BREAST  SURGERY  08-30-10   mastectomy   CARDIAC CATHETERIZATION  08/2011   20% LAD stenosis, 20% diagonal stenosis, 10-20% proximal dominant RCA stenosis   COLON SURGERY     LEFT HEART CATHETERIZATION WITH CORONARY ANGIOGRAM N/A 09/08/2011   Procedure: LEFT HEART CATHETERIZATION WITH CORONARY ANGIOGRAM;  Surgeon: Troy Sine, MD;  Location: [facility] CATH LAB;  Service: Cardiovascular;  Laterality: N/A;   MASTECTOMY     MINOR BREAST BIOPSY Left 11/25/2015   Procedure: MINOR EXCISION MASS LEFT CHEST WALL;  Surgeon: Autumn Messing III, MD;  Location: [facility];  Service: General;  Laterality: Left;   MINOR BREAST BIOPSY Left 07/17/2016   Procedure: EXCISION OF 2 LEFT CHEST WALL MASSES;  Surgeon: Autumn Messing III, MD;  Location: [facility];  Service: General;  Laterality: Left;   TONSILLECTOMY       Social History:   reports that she quit smoking about 31 years ago. Her smoking use included cigarettes. She has never  used smokeless tobacco. She reports current alcohol use of about 2.0 standard drinks of alcohol per week. She reports that she does not use drugs.   Family History:  Her family history includes Arthritis in her sister; Breast cancer (age of onset: 32) in her sister; Cancer in her paternal uncle; Colon cancer in her maternal grandmother; Dementia in her mother; Heart attack in her brother and brother; Heart disease in her sister; Ovarian cancer (age of onset: 36) in her niece; Pancreatic cancer in her brother; Prostate cancer (age of onset: 33) in her father; Sudden death in her brother.   Allergies Allergies  Allergen Reactions   Contrast Media [Iodinated Contrast Media] Anaphylaxis    09/19/17-Pt premedicated. Then pt stated she was DNR. Spoke with [name] who did not realize allergy was recorded as anaphylactic. [name] verbally gave ok to change CT exam to without IV contrast. Pt and daughter do not recall occurrence of anaphylactic reaction to contrast media. Geanie Kenning, 3:27pm   Penicillins Anaphylaxis and Swelling    Has patient had a PCN reaction causing immediate rash, facial/tongue/throat swelling, SOB or lightheadedness with hypotension: Yes Has patient had a PCN reaction causing severe rash involving mucus membranes or skin necrosis: No Has patient had a PCN reaction that required hospitalization Yes Has patient had a PCN reaction occurring within the last 10 years: No If all of the above answers are "NO", then may proceed with Cephalosporin use.    Adhesive [Tape] Other (See Comments)    blisters   Cephalexin Swelling   Ciprofloxacin Swelling   Demerol Nausea And Vomiting   Meperidine Hcl    Sulfa Antibiotics    Sulfamethoxazole-Trimethoprim Itching and Swelling    REACTION: swelling/hives   Quinolones Itching and Rash    REACTION: itching, rash Pt. Reports no problems with levaquin     Home Medications  Prior to Admission medications   Medication Sig Start Date End  Date Taking? Authorizing Provider  ondansetron (ZOFRAN) 4 MG tablet TAKE 1 TABLET BY MOUTH EVERY 8 HOURS AS NEEDED FOR NAUSEA OR VOMITING. Patient taking differently: Take 4 mg by mouth every 8 (eight) hours as needed for nausea or vomiting. 11/16/22  Yes Truitt Merle, MD  acetaminophen (TYLENOL) 500 MG tablet Take 1,000 mg by mouth daily as needed for moderate pain. Reported on 03/10/2016    [provider]  ALPRAZolam Duanne Moron) 0.25 MG tablet Take by mouth.    [provider]  apixaban (ELIQUIS) 5 MG TABS  tablet Take 1 tablet (5 mg total) by mouth 2 (two) times daily. Start taking after completion of starter pack. 09/27/22   Rebbeca Mihailo Sage B, RPH-CPP  doxepin (SINEQUAN) 10 MG capsule Take 10 mg by mouth at bedtime.    [provider]  famotidine (PEPCID) 20 MG tablet Take 20 mg by mouth daily.    [provider]  FLOVENT HFA 44 MCG/ACT inhaler Inhale into the lungs. 11/20/20   [provider]  fluticasone (FLONASE) 50 MCG/ACT nasal spray Place 1 spray into both nostrils 2 (two) times daily.    [provider]  guaiFENesin (MUCINEX) 600 MG 12 hr tablet Take 600 mg by mouth daily as needed for cough or to loosen phlegm. Reported on 03/28/2016    [provider]  hydrochlorothiazide (MICROZIDE) 12.5 MG capsule TAKE 1 CAPSULE BY MOUTH ONCE DAILYAPPOINTMENT REQUIRED FOR FUTURE REFILLS 10/31/22   Troy Sine, MD  losartan (COZAAR) 25 MG tablet Take 25 mg by mouth daily. Takes 50 mg in the morning and 25 mg in the evening. 10/20/22   [provider]  losartan (COZAAR) 50 MG tablet Take 1 tablet by mouth once daily 11/15/22   Troy Sine, MD  nitroGLYCERIN (NITROSTAT) 0.4 MG SL tablet Place 1 tablet (0.4 mg total) under the tongue every 5 (five) minutes as needed for chest pain. <PLEASE MAKE APPOINTMENT> 02/25/16   Troy Sine, MD  pseudoephedrine-acetaminophen (TYLENOL SINUS) 30-500 MG TABS tablet Take 1 tablet by mouth every 4 (four)  hours as needed.    [provider]     Critical care time:      Georgann Housekeeper, AGACNP-BC [facility]  See Amion for personal pager PCCM on call pager [phone] until 7pm. Please call Elink 7p-7a. [phone]  11/22/2022 4:20 AM

## 2022-11-22 NOTE — Consult Note (Addendum)
[facility] Consult: 10:06 AM 11/22/2022  LOS: 0 days    Referring Provider: Dr Pietro Cassis  Primary Care Physician:  Charlane Ferretti, MD Primary Gastroenterologist:  Dr. Verl Blalock     Reason for Consultation:  Pancreatitis, acute anemia.     HPI: Gloria [name] is a 89 y.o. female.  Hx of breast cancer. Diverticulitis, complicated leading to sigmoid resection. Colonoscopies going back to 1995 with findings of sigmoid diverticulosis, IBS, cecal diverticulum, idiopathic IC valve erosions, hyperplastic polyp rectosigmoid junction. Developed DVT following ankle injury, started Eliquis 8 November, early December 2023.    08/2002 colonoscopy.  For evaluation diarrhea, abdominal pain, bloating.  Good prep.  Healthy appearing colonic anastomosis.  No findings to explain symptoms.  Dr. Sharlett Iles suspected symptoms due to adhesions. 09/2006 EGD.  For reflux, dysphagia.  Dr. Sharlett Iles observed hiatal hernia, chronic GERD and dilated an esophageal stricture.  Chronic dyspnea that is stable.  Several weeks anorexia and nausea, no vomiting.  Was prescribed Zofran prn. Chronic, laxative dependent/requiring constipation.  Has not had melenic or bloody stools.  About 5 days ago developed pain in her right lower quadrant and into the mid pelvic area.  Vague left upper quadrant pain, not severe.  On 12/2 seen in the ED for 1 day of dark stool.  FOBT negative.  Hb 11.  Discharged home with usual meds including famotidine 20 mg daily, Zofran as needed, apixaban.    At PCP office yesterday for evaluation of urinary frequency, dysuria suspected UTI.  Daughter relayed concern about dehydration and confusion for 3 days.  Had audible wheezing on lung exam treated with Solu-Medrol and albuterol per EMS, transported to emergency  department. Other than apixaban, no other new medications in recent months  Fever 102.3 f.  Bps as low as 90s/50s, HR to 135.  BP now normotensive and tachycardia resolved.   Elevated lactate 5.7.   Hgb 5.6.Marland Kitchen  2 PRBC.. 9.1.  Was 11 seven weeks ago   FOBT + T. bili 0.2.  Alk phos 71.  AST/ALT 45/18.  Lipase 26. INR 1.9. GFR 33 - 40.  Glucose 184.  Lactate 2.3. UA with significant bacteriuria, positive nitrites, positive leukocytes, no significant microscopic RBCs or WBCs. Enterobacter, Klebsiella on blood culture PCR.    11/21/2022 noncontrast CTAP: Diffuse inflammation at pancreas, duodenum, proximal SB, descending colon concerning for acute pancreatitis.  Urinary bladder wall thickening and inflammation concerning for cystitis.  Right ureteral stone.  Trace left pleural effusion.  Small degree airspace disease, atelectasis versus infection.  Retroperitoneal, left pararenal stranding: Differential includes hemorrhage, infectious/inflammatory process and retroperitoneal fibrosis.  Mild body wall edema. Abdominal ultrasound, not yet read.  Still living at home.  Her granddaughter spends the day with her.  Her daughter provides meals and general housekeeping. At most drinks 1/2 glass of wine every few months   Past Medical History:  Diagnosis Date   Anxiety    Blood in urine    CAD (coronary artery disease)    non obstructive by cath   Cancer [facility])  breast- left   COVID    Depression    Family history of breast cancer    Family history of colon cancer    Family history of ovarian cancer    Family history of pancreatic cancer    Family history of prostate cancer    GERD (gastroesophageal reflux disease)    Hypertension    Knee fracture    Personal history of radiation therapy 2017   TR (tricuspid regurgitation)    mild by Echo 12/2008 EF >55%    Past Surgical History:  Procedure Laterality Date   ABDOMINAL AORTAGRAM N/A 09/08/2011   Procedure: ABDOMINAL Maxcine Ham;  Surgeon:  Troy Sine, MD;  Location: [facility] CATH LAB;  Service: Cardiovascular;  Laterality: N/A;   ABDOMINAL HYSTERECTOMY     BREAST EXCISIONAL BIOPSY Left 2017   X3   BREAST SURGERY  08-30-10   mastectomy   CARDIAC CATHETERIZATION  08/2011   20% LAD stenosis, 20% diagonal stenosis, 10-20% proximal dominant RCA stenosis   COLON SURGERY     LEFT HEART CATHETERIZATION WITH CORONARY ANGIOGRAM N/A 09/08/2011   Procedure: LEFT HEART CATHETERIZATION WITH CORONARY ANGIOGRAM;  Surgeon: Troy Sine, MD;  Location: [facility] CATH LAB;  Service: Cardiovascular;  Laterality: N/A;   MASTECTOMY     MINOR BREAST BIOPSY Left 11/25/2015   Procedure: MINOR EXCISION MASS LEFT CHEST WALL;  Surgeon: Autumn Messing III, MD;  Location: [place];  Service: General;  Laterality: Left;   MINOR BREAST BIOPSY Left 07/17/2016   Procedure: EXCISION OF 2 LEFT CHEST WALL MASSES;  Surgeon: Autumn Messing III, MD;  Location: [place];  Service: General;  Laterality: Left;   TONSILLECTOMY      Prior to Admission medications   Medication Sig Start Date End Date Taking? Authorizing Provider  ondansetron (ZOFRAN) 4 MG tablet TAKE 1 TABLET BY MOUTH EVERY 8 HOURS AS NEEDED FOR NAUSEA OR VOMITING. Patient taking differently: Take 4 mg by mouth every 8 (eight) hours as needed for nausea or vomiting. 11/16/22  Yes Truitt Merle, MD  acetaminophen (TYLENOL) 500 MG tablet Take 1,000 mg by mouth daily as needed for moderate pain. Reported on 03/10/2016    [provider]  ALPRAZolam Duanne Moron) 0.25 MG tablet Take by mouth.    [provider]  apixaban (ELIQUIS) 5 MG TABS tablet Take 1 tablet (5 mg total) by mouth 2 (two) times daily. Start taking after completion of starter pack. 09/27/22   Rebbeca Paul B, RPH-CPP  doxepin (SINEQUAN) 10 MG capsule Take 10 mg by mouth at bedtime.    [provider]  famotidine (PEPCID) 20 MG tablet Take 20 mg by mouth daily.    [provider]  FLOVENT HFA 44  MCG/ACT inhaler Inhale into the lungs. 11/20/20   [provider]  fluticasone (FLONASE) 50 MCG/ACT nasal spray Place 1 spray into both nostrils 2 (two) times daily.    [provider]  guaiFENesin (MUCINEX) 600 MG 12 hr tablet Take 600 mg by mouth daily as needed for cough or to loosen phlegm. Reported on 03/28/2016    [provider]  hydrochlorothiazide (MICROZIDE) 12.5 MG capsule TAKE 1 CAPSULE BY MOUTH ONCE DAILY\APPOINTMENT REQUIRED FOR FUTURE REFILLS 10/31/22   Troy Sine, MD  losartan (COZAAR) 25 MG tablet Take 25 mg by mouth daily. Takes 50 mg in the morning and 25 mg in the evening. 10/20/22   [provider]  losartan (COZAAR) 50 MG tablet Take 1 tablet by  mouth once daily 11/15/22   Troy Sine, MD  nitroGLYCERIN (NITROSTAT) 0.4 MG SL tablet Place 1 tablet (0.4 mg total) under the tongue every 5 (five) minutes as needed for chest pain. <PLEASE MAKE APPOINTMENT> 02/25/16   Troy Sine, MD  pseudoephedrine-acetaminophen (TYLENOL SINUS) 30-500 MG TABS tablet Take 1 tablet by mouth every 4 (four) hours as needed.    [provider]    Scheduled Meds:  albuterol  2.5 mg Nebulization Q6H   doxepin  10 mg Oral QHS   fluticasone  1 spray Each Nare BID   pantoprazole (PROTONIX) IV  40 mg Intravenous Q12H   Infusions:  sodium chloride     aztreonam     lactated ringers Stopped (11/22/22 0957)   [START ON 11/23/2022] vancomycin     PRN Meds: acetaminophen **OR** acetaminophen, pseudoephedrine **AND** acetaminophen, ALPRAZolam, guaiFENesin, hydrALAZINE, zolpidem   Allergies as of 11/21/2022 - Review Complete 11/21/2022  Allergen Reaction Noted   Contrast media [iodinated contrast media] Anaphylaxis 09/08/2011   Penicillins Anaphylaxis and Swelling 09/19/2007   Adhesive [tape] Other (See Comments) 09/08/2011   Cephalexin Swelling 09/07/2011   Ciprofloxacin Swelling 06/12/2011   Demerol Nausea And Vomiting 06/12/2011   Meperidine hcl   09/08/2019   Sulfa antibiotics  09/08/2019   Sulfamethoxazole-trimethoprim Itching and Swelling    Quinolones Itching and Rash 02/19/2009    Family History  Problem Relation Age of Onset   Prostate cancer Father 54       metastatic   Heart disease Sister    Breast cancer Sister 44   Pancreatic cancer Brother        dx in his 54s   Colon cancer Maternal Grandmother        dx <50   Dementia Mother    Sudden death Brother    Heart attack Brother    Heart attack Brother    Arthritis Sister    Cancer Paternal Uncle        unknown type   Ovarian cancer Niece 49    Social History   Socioeconomic History   Marital status: Divorced    Spouse name: Not on file   Number of children: Not on file   Years of education: Not on file   Highest education level: Not on file  Occupational History   Not on file  Tobacco Use   Smoking status: Former    Types: Cigarettes    Quit date: 11/15/1991    Years since quitting: 31.0   Smokeless tobacco: Never  Substance and Sexual Activity   Alcohol use: Yes    Alcohol/week: 2.0 standard drinks of alcohol    Types: 2 Glasses of wine per week    Comment: 1 glass per week   Drug use: No   Sexual activity: Not Currently  Other Topics Concern   Not on file  Social History Narrative   Not on file   Social Determinants of Health   Financial Resource Strain: Not on file  Food Insecurity: Not on file  Transportation Needs: Not on file  Physical Activity: Not on file  Stress: Not on file  Social Connections: Not on file  Intimate Partner Violence: Not on file    REVIEW OF SYSTEMS: Constitutional: Some weakness. ENT:  No nose bleeds.  HOH Pulm: Chronic shortness of breath.  Son at bedside says she wheezes and looks short of breath chronically. CV:  No palpitations, no LE edema.  GU: Per HPI. Heme: Denies unusual or excessive bleeding  or bruising. Transfusions: Transfused overnight but otherwise no history of blood products. Neuro:  No  headaches, no peripheral tingling or numbness.  No syncope, no seizures. Derm:  No itching, no rash or sores.  Endocrine:  No sweats or chills.  No polyuria or dysuria Immunization: Reviewed. Travel:  None     PHYSICAL EXAM: Vital signs in last 24 hours: Vitals:   11/22/22 0641 11/22/22 0849  BP: (!) 119/56 128/76  Pulse: 70 69  Resp: 18 (!) 23  Temp: 98 F (36.7 C) 97.6 F (36.4 C)  SpO2: 96% 97%   Wt Readings from Last 3 Encounters:  11/21/22 57.6 kg  09/30/22 57.6 kg  09/27/22 59 kg    General: Aged, does not look acutely ill or uncomfortable.  She is somewhat fretful Head: No signs of head trauma or facial asymmetry. Eyes: Pale conjunctiva Ears: Marked HOH Nose: No congestion or discharge Mouth: Oral mucosa pink, moist, clear.  Tongue midline. Neck: No JVD or thyromegaly Lungs: Audible wheezing and mild tachypnea. Heart: RRR no mrg.    Abdomen:  soft, tender RLQ, minor discomfrot LUQ.  No g/r.  Active bs.  ND Rectal: deferred.   Not documented but pt says done by staff yesterday.  FOBT +  Musc/Skeltl: no joint redness or swelling Extremities:  + LLE edema  Neurologic:  HOH.   anxious, perseverating on access lines and bedding.  Able to answer questions appropriately, not overtly confused Skin:  no sign rash, sores, brusing Psych:  anxious, fluid speech  Intake/Output from previous day: 01/23 0701 - 01/24 0700 In: 2420.7 [I.V.:405.7; Blood:315; IV Piggyback:1700] Out: 275 [Urine:270; Blood:5] Intake/Output this shift: No intake/output data recorded.  LAB RESULTS: Recent Labs    11/21/22 1758 11/22/22 0204 11/22/22 0500  WBC 7.1  --  6.3  HGB 5.6* 9.2* 9.1*  HCT 18.4* 27.0* 28.1*  PLT 227  --  151   BMET Lab Results  Component Value Date   NA 136 11/22/2022   NA 135 11/22/2022   NA 136 11/21/2022   K 3.8 11/22/2022   K 3.7 11/22/2022   K 3.8 11/21/2022   CL 104 11/22/2022   CL 104 11/22/2022   CL 104 11/21/2022   CO2 20 (L) 11/22/2022   CO2  19 (L) 11/21/2022   CO2 23 09/30/2022   GLUCOSE 184 (H) 11/22/2022   GLUCOSE 172 (H) 11/22/2022   GLUCOSE 140 (H) 11/21/2022   BUN 25 (H) 11/22/2022   BUN 23 11/22/2022   BUN 28 (H) 11/21/2022   CREATININE 1.47 (H) 11/22/2022   CREATININE 1.10 (H) 11/22/2022   CREATININE 1.41 (H) 11/21/2022   CALCIUM 8.2 (L) 11/22/2022   CALCIUM 8.7 (L) 11/21/2022   CALCIUM 9.1 09/30/2022   LFT Recent Labs    11/21/22 1758 11/22/22 0500  PROT 5.9* 5.5*  ALBUMIN 3.3* 2.8*  AST 31 45*  ALT 12 18  ALKPHOS 74 71  BILITOT 0.8 1.3*   PT/INR Lab Results  Component Value Date   INR 1.9 (H) 11/22/2022   INR 2.2 (H) 11/21/2022   INR 1.0 07/23/2009   Hepatitis Panel No results for input(s): "HEPBSAG", "HCVAB", "HEPAIGM", "HEPBIGM" in the last 72 hours. C-Diff No components found for: "CDIFF" Lipase     Component Value Date/Time   LIPASE 26 11/22/2022 0041    Drugs of Abuse  No results found for: "LABOPIA", "COCAINSCRNUR", "LABBENZ", "AMPHETMU", "THCU", "LABBARB"   RADIOLOGY STUDIES: DG C-Arm 1-60 Min-No Report  Result Date: 11/22/2022 Fluoroscopy was utilized  by the requesting physician.  No radiographic interpretation.   CT CHEST ABDOMEN PELVIS WO CONTRAST  Result Date: 11/21/2022 CLINICAL DATA:  Sepsis.  History of breast cancer. EXAM: CT CHEST, ABDOMEN AND PELVIS WITHOUT CONTRAST TECHNIQUE: Multidetector CT imaging of the chest, abdomen and pelvis was performed following the standard protocol without IV contrast. RADIATION DOSE REDUCTION: This exam was performed according to the departmental dose-optimization program which includes automated exposure control, adjustment of the mA and/or kV according to patient size and/or use of iterative reconstruction technique. COMPARISON:  CT chest abdomen and pelvis 09/19/2017 FINDINGS: CT CHEST FINDINGS Cardiovascular: No significant vascular findings. Normal heart size. No pericardial effusion. There are atherosclerotic calcifications of the  aorta. Mediastinum/Nodes: There is a 6 mm hypodense right thyroid nodule, unchanged. There are no enlarged mediastinal, hilar or axillary lymph nodes. Lungs/Pleura: Mild emphysematous changes are seen in the lung apices. There is a small amount of atelectasis and airspace disease in the lingula. There is a trace left pleural effusion. There is dependent atelectasis in both lower lobes. Musculoskeletal: No acute fracture or focal osseous lesion. Left mastectomy changes are present. There surgical clips in the left axilla. CT ABDOMEN PELVIS FINDINGS Hepatobiliary: No focal liver abnormality is seen. No gallstones, gallbladder wall thickening, or biliary dilatation. Pancreas: There is inflammatory stranding surrounding the entire pancreas. There is no fluid collection or ductal dilatation. Spleen: Normal in size without focal abnormality. Adrenals/Urinary Tract: There is bladder wall thickening with mild surrounding inflammatory stranding. There is bilateral perinephric fat stranding, left greater than right. There is also some hyperdensity and stranding in the left pararenal space. There is a 2 mm calculus in the proximal right ureter image 3/72 with mild right-sided hydronephrosis. There is no left-sided hydronephrosis. Left renal cysts are unchanged measuring up 2 4.5 cm. Stomach/Bowel: There is inflammatory stranding surrounding the duodenal and proximal small bowel as it approximates pancreas. There is also some wall thickening of the descending colon with mild surrounding inflammatory stranding. There is sigmoid colon diverticulosis without evidence for diverticulitis. The appendix is not seen. There is no pneumatosis, bowel obstruction or free air. Stomach is decompressed. Vascular/Lymphatic: Aorta and IVC are normal in size. There are atherosclerotic calcifications of the aorta. There is diffuse retroperitoneal/para-aortic inflammatory stranding which is new from prior. No discrete enlarged lymph nodes are  identified. Reproductive: Status post hysterectomy. No adnexal masses. Other: There is a small fat containing umbilical hernia. There is mild body wall edema. There is no ascites. Musculoskeletal: There are degenerative changes of the lower lumbar spine. IMPRESSION: 1. Diffuse inflammatory stranding surrounding the pancreas, duodenum, proximal small bowel, and descending colon. Findings are worrisome for acute pancreatitis. 2. Bladder wall thickening with surrounding inflammatory stranding worrisome for cystitis. 3. 2 mm calculus in the proximal right ureter with mild right-sided hydronephrosis. 4. Trace left pleural effusion. 5. Small amount of airspace disease in the lingula may represent atelectasis or infection. 6. New diffuse retroperitoneal stranding and left pararenal stranding of uncertain etiology. Findings may be related to retroperitoneal hemorrhage, infectious/inflammatory process, or less likely retroperitoneal fibrosis. 7. Mild body wall edema. Aortic Atherosclerosis (ICD10-I70.0) and Emphysema (ICD10-J43.9). Electronically Signed   By: Ronney Asters M.D.   On: 11/21/2022 23:29   DG Chest Port 1 View  Result Date: 11/21/2022 CLINICAL DATA:  Sepsis, shortness of breath.  Breast cancer. EXAM: PORTABLE CHEST 1 VIEW COMPARISON:  09/06/2022 FINDINGS: Reverse lordotic projection. Mild atherosclerotic vascular calcification of the thoracic aorta. Left axillary clips. Lingular scarring or atelectasis along  the left heart border, similar to prior exams. IMPRESSION: 1. Lingular scarring or atelectasis along the left heart border, similar to prior exams. 2. Mild atherosclerotic vascular calcification of the thoracic aorta. Electronically Signed   By: Van Clines M.D.   On: 11/21/2022 18:28      IMPRESSION:     CT w changes of pancreatitis but Lipase normal and most of her pain has been in RLQ.  GB intact and unremarkable on [place].  Several weeks of nausea wo vomiting.  No ETOH of significance.  Only  new med eliquis    Anemia.  Hgb drop 5.5 g over 7 weeks.    Reported black stool in setting of chronic constipation.  FOBT +.     Eliquis for DVT starting late nov/early December.      Klebsiella, enterobacter UTI.  Non obst ureteral stone.      Remote resection l colon/sigmoid for diverticulosis.      GERD hx.  Famotidine at home.      Anxiety.     PLAN:       Per Dr Silverio Decamp.  EGD??  MRCP vs CTAP?    Leave Protonix 40 IV bid in place for now.     Jaileigh Weimer  11/22/2022, 10:06 AM Phone [phone]   Attending physician's note  I have taken a history, reviewed the chart and examined the patient. I performed a substantive portion of this encounter, including complete performance of at least one of the key components, in conjunction with the APP. I agree with the APP's note, impression and recommendations.    89 year old very pleasant female with history of breast cancer, diverticulitis s/p sigmoid resection admitted with decline in hemoglobin, nausea and abdominal pain  CT abdomen pelvis concerning for acute pancreatitis and also possible RP bleed or retroperitoneal fibrosis Complaints of right flank pain and also mid abdominal pain No history of fall On Eliquis since December for DVT No history of overt GI bleeding, negative for melena or hematochezia Fecal Hemoccult positive UA positive for significant bacteriuria, blood culture PCR positive for Enterobacter and Klebsiella  Will obtain MRI abdomen and pelvis/MRCP to further evaluate the retroperitoneal fibrosis?  Possible reorganizing RP bleed, would explain the decline in hemoglobin Will need to exclude choledocholithiasis or any pancreatic lesion  Supportive care for pancreatitis, IV fluids Pain control Hold Eliquis Continue PPI 40 mg twice daily IV Will defer EGD for now, will reassess based on MRI findings    The patient and her son at bedside were provided an opportunity to ask questions and all were  answered. They agreed with the plan and demonstrated an understanding of the instructions.  Damaris Hippo , MD [phone]

## 2022-11-22 NOTE — Progress Notes (Incomplete)
Pt currently has a foley in. When she came up from MRI at 2100 she was soaking wet and leaking from her foley. A nurse on the floor irrigated the line.  2230- However, currently the patient is complaining she is peeing a lot but she has not putting out anything in her foley and continues leaking urine from the foley and onto bed pad. She is in a lot of pain on the right side, which I've given morphine and tylenol for.   2253-Bladder scanned showed 54m. On dayshift it was only charted 2014mbloody urine.   2300-Paged on call Hospitalist, who told RN to page urology since urology placed foley in for further instructions.   2319-RN  paged urologist EXT AmLeta BaptistWaiting for call back  [facility]. Waiting for call back   0030-RN called rapid who advised RN to irrigate the foley again and check to see if 1082mas in the balloon but ultimately would need to be further instructed by the urologist. RN performed both things and shortly after patient peed again on pad with very little getting in the foley bag.   0620-This RN never received a call back from urology despite paging multiple times. Will pass this information on to dayshift RN

## 2022-11-22 NOTE — Op Note (Signed)
Preoperative diagnosis:  Right infected ureteral stone   Postoperative diagnosis:  same   Procedure:  Cystoscopy right ureteral stent placement right retrograde pyelography with interpretation   Surgeon: Ardis Hughs, MD  Anesthesia: General  Complications: None  Intraoperative findings:  right retrograde pyelography demonstrated a filling defect within the right ureter consistent with the patient's known calculus without other abnormalities.  EBL: Minimal  Specimens: None  Indication: Gloria [name] is a 89 y.o. patient with septoid physiology and a 74m right proximal ureteral stone with hydronephrosis. After reviewing the management options for treatment, he elected to proceed with the above surgical procedure(s). We have discussed the potential benefits and risks of the procedure, side effects of the proposed treatment, the likelihood of the patient achieving the goals of the procedure, and any potential problems that might occur during the procedure or recuperation. Informed consent has been obtained.  Description of procedure:  The patient was taken to the operating room and general anesthesia was induced.  The patient was placed in the dorsal lithotomy position, prepped and draped in the usual sterile fashion, and preoperative antibiotics were administered. A preoperative time-out was performed.   Cystourethroscopy was performed.  The patient's urethra was examined and was normal. The bladder was then systematically examined in its entirety. There was no evidence for any bladder tumors, stones, or other mucosal pathology.    Attention then turned to the right ureteral orifice and a ureteral catheter was used to intubate the ureteral orifice.  Omnipaque contrast was injected through the ureteral catheter and a retrograde pyelogram was performed with findings as dictated above.  A 0.38 sensor guidewire was then advanced up the right ureter into the renal pelvis under  fluoroscopic guidance.  The wire was then backloaded through the cystoscope and a ureteral stent was advance over the wire using Seldinger technique.  The stent was positioned appropriately under fluoroscopic and cystoscopic guidance.  The wire was then removed with an adequate stent curl noted in the renal pelvis as well as in the bladder.  The bladder was then emptied and the procedure ended.  The patient appeared to tolerate the procedure well and without complications.  The patient was able to be awakened and transferred to the recovery unit in satisfactory condition.    BArdis Hughs M.D.

## 2022-11-22 NOTE — ED Provider Notes (Signed)
I spoke with Dr. Duwayne Heck with critical care.  She is aware of the patient and the [facility] hospitalist (rathore) request for ICU evaluation.  Patient will be seen by the ICU team prior to admission and after her operative management   Ripley Fraise, MD 11/22/22 0121

## 2022-11-22 NOTE — Progress Notes (Signed)
PHARMACY - PHYSICIAN COMMUNICATION CRITICAL VALUE ALERT - BLOOD CULTURE IDENTIFICATION (BCID)  Gloria [name] is an 89 y.o. female who presented to [facility] on 11/21/2022 with a chief complaint of dysuria, concern for UTI  Assessment: 89 YOF on antibiotics for UTI concern for infected R-ureteral stone s/p cystoscopy and stent placement 1/24 and now with 1/23 Bcx growing GNR in 1 of 2 bottles with BCID detecting Klebsiella PNA - no resistance mechanisms.   Name of physician (or Provider) Contacted: Dahal  Current antibiotics: Vancomycin + Aztreonam  Changes to prescribed antibiotics recommended:  - D/c Vancomycin - Continue Aztreonam 1g IV every 8 hours - Consider penicillin allergy clarification and testing  Results for orders placed or performed during the hospital encounter of 11/21/22  Blood Culture ID Panel (Reflexed) (Collected: 11/21/2022  6:07 PM)  Result Value Ref Range   Enterococcus faecalis NOT DETECTED NOT DETECTED   Enterococcus Faecium NOT DETECTED NOT DETECTED   Listeria monocytogenes NOT DETECTED NOT DETECTED   Staphylococcus species NOT DETECTED NOT DETECTED   Staphylococcus aureus (BCID) NOT DETECTED NOT DETECTED   Staphylococcus epidermidis NOT DETECTED NOT DETECTED   Staphylococcus lugdunensis NOT DETECTED NOT DETECTED   Streptococcus species NOT DETECTED NOT DETECTED   Streptococcus agalactiae NOT DETECTED NOT DETECTED   Streptococcus pneumoniae NOT DETECTED NOT DETECTED   Streptococcus pyogenes NOT DETECTED NOT DETECTED   A.calcoaceticus-baumannii NOT DETECTED NOT DETECTED   Bacteroides fragilis NOT DETECTED NOT DETECTED   Enterobacterales DETECTED (A) NOT DETECTED   Enterobacter cloacae complex NOT DETECTED NOT DETECTED   Escherichia coli NOT DETECTED NOT DETECTED   Klebsiella aerogenes NOT DETECTED NOT DETECTED   Klebsiella oxytoca NOT DETECTED NOT DETECTED   Klebsiella pneumoniae DETECTED (A) NOT DETECTED   Proteus species NOT DETECTED NOT DETECTED    Salmonella species NOT DETECTED NOT DETECTED   Serratia marcescens NOT DETECTED NOT DETECTED   Haemophilus influenzae NOT DETECTED NOT DETECTED   Neisseria meningitidis NOT DETECTED NOT DETECTED   Pseudomonas aeruginosa NOT DETECTED NOT DETECTED   Stenotrophomonas maltophilia NOT DETECTED NOT DETECTED   Candida albicans NOT DETECTED NOT DETECTED   Candida auris NOT DETECTED NOT DETECTED   Candida glabrata NOT DETECTED NOT DETECTED   Candida krusei NOT DETECTED NOT DETECTED   Candida parapsilosis NOT DETECTED NOT DETECTED   Candida tropicalis NOT DETECTED NOT DETECTED   Cryptococcus neoformans/gattii NOT DETECTED NOT DETECTED   CTX-M ESBL NOT DETECTED NOT DETECTED   Carbapenem resistance IMP NOT DETECTED NOT DETECTED   Carbapenem resistance KPC NOT DETECTED NOT DETECTED   Carbapenem resistance NDM NOT DETECTED NOT DETECTED   Carbapenem resist OXA 48 LIKE NOT DETECTED NOT DETECTED   Carbapenem resistance VIM NOT DETECTED NOT DETECTED    Thank you for allowing pharmacy to be a part of this patient's care.  Alycia Rossetti, PharmD, BCPS Infectious Diseases Clinical Pharmacist 11/22/2022 12:43 PM   **Pharmacist phone directory can now be found on [email or web address] ([email or web address]).  Listed under [place].

## 2022-11-22 NOTE — Anesthesia Postprocedure Evaluation (Signed)
Anesthesia Post Note  Patient: JAQUANDA WICKERSHAM  Procedure(s) Performed: CYSTOSCOPY WITH RETROGRADE PYELOGRAM/URETERAL STENT PLACEMENT (Right)     Patient location during evaluation: PACU Anesthesia Type: General Level of consciousness: awake and alert, patient cooperative and oriented Pain management: pain level controlled Vital Signs Assessment: post-procedure vital signs reviewed and stable Respiratory status: spontaneous breathing, nonlabored ventilation, respiratory function stable and patient connected to nasal cannula oxygen (post op wheezing treated with albuterol neb) Cardiovascular status: blood pressure returned to baseline and stable Postop Assessment: no apparent nausea or vomiting Anesthetic complications: no   No notable events documented.  Last Vitals:  Vitals:   11/22/22 0300 11/22/22 0315  BP: 115/68 128/75  Pulse: 80 69  Resp: (!) 27 (!) 23  Temp:    SpO2: 92% 97%    Last Pain:  Vitals:   11/22/22 0300  TempSrc:   PainSc: 0-No pain                 Emmanuella Mirante,E. Destyni Hoppel

## 2022-11-22 NOTE — Transfer of Care (Signed)
Immediate Anesthesia Transfer of Care Note  Patient: Gloria [name]  Procedure(s) Performed: CYSTOSCOPY WITH RETROGRADE PYELOGRAM/URETERAL STENT PLACEMENT (Right)  Patient Location: PACU  Anesthesia Type:General  Level of Consciousness: drowsy  Airway & Oxygen Therapy: Patient Spontanous Breathing and Patient connected to face mask oxygen  Post-op Assessment: Report given to RN and Post -op Vital signs reviewed and stable  Post vital signs: Reviewed and stable  Last Vitals:  Vitals Value Taken Time  BP 114/67 11/22/22 0235  Temp    Pulse 73 11/22/22 0244  Resp 19 11/22/22 0244  SpO2 99 % 11/22/22 0244  Vitals shown include unvalidated device data.  Last Pain:  Vitals:   11/22/22 0042  TempSrc: Oral  PainSc:          Complications: No notable events documented.

## 2022-11-23 ENCOUNTER — Encounter (HOSPITAL_COMMUNITY): Payer: Self-pay | Admitting: Urology

## 2022-11-23 DIAGNOSIS — R58 Hemorrhage, not elsewhere classified: Secondary | ICD-10-CM

## 2022-11-23 DIAGNOSIS — D649 Anemia, unspecified: Secondary | ICD-10-CM | POA: Diagnosis not present

## 2022-11-23 DIAGNOSIS — D62 Acute posthemorrhagic anemia: Secondary | ICD-10-CM | POA: Diagnosis not present

## 2022-11-23 DIAGNOSIS — N3001 Acute cystitis with hematuria: Secondary | ICD-10-CM | POA: Diagnosis not present

## 2022-11-23 DIAGNOSIS — K683 Retroperitoneal hematoma: Secondary | ICD-10-CM

## 2022-11-23 LAB — COMPREHENSIVE METABOLIC PANEL
ALT: 18 U/L (ref 0–44)
AST: 29 U/L (ref 15–41)
Albumin: 2.4 g/dL — ABNORMAL LOW (ref 3.5–5.0)
Alkaline Phosphatase: 59 U/L (ref 38–126)
Anion gap: 7 (ref 5–15)
BUN: 28 mg/dL — ABNORMAL HIGH (ref 8–23)
CO2: 18 mmol/L — ABNORMAL LOW (ref 22–32)
Calcium: 7 mg/dL — ABNORMAL LOW (ref 8.9–10.3)
Chloride: 111 mmol/L (ref 98–111)
Creatinine, Ser: 1.39 mg/dL — ABNORMAL HIGH (ref 0.44–1.00)
GFR, Estimated: 36 mL/min — ABNORMAL LOW (ref >60)
Glucose, Bld: 104 mg/dL — ABNORMAL HIGH (ref 70–99)
Potassium: 3.3 mmol/L — ABNORMAL LOW (ref 3.5–5.1)
Sodium: 136 mmol/L (ref 135–145)
Total Bilirubin: 0.6 mg/dL (ref 0.3–1.2)
Total Protein: 4.6 g/dL — ABNORMAL LOW (ref 6.5–8.1)

## 2022-11-23 LAB — LACTATE DEHYDROGENASE: LDH: 152 U/L (ref 98–192)

## 2022-11-23 LAB — PREPARE RBC (CROSSMATCH)

## 2022-11-23 LAB — RETICULOCYTES
Immature Retic Fract: 33.6 % — ABNORMAL HIGH (ref 2.3–15.9)
RBC.: 2.34 MIL/uL — ABNORMAL LOW (ref 3.87–5.11)
Retic Count, Absolute: 71.1 10*3/uL (ref 19.0–186.0)
Retic Ct Pct: 3 % (ref 0.4–3.1)

## 2022-11-23 LAB — CBC
HCT: 21.2 % — ABNORMAL LOW (ref 36.0–46.0)
Hemoglobin: 7 g/dL — ABNORMAL LOW (ref 12.0–15.0)
MCH: 29.7 pg (ref 26.0–34.0)
MCHC: 33 g/dL (ref 30.0–36.0)
MCV: 89.8 fL (ref 80.0–100.0)
Platelets: 152 10*3/uL (ref 150–400)
RBC: 2.36 MIL/uL — ABNORMAL LOW (ref 3.87–5.11)
RDW: 16.2 % — ABNORMAL HIGH (ref 11.5–15.5)
WBC: 9.2 10*3/uL (ref 4.0–10.5)
nRBC: 0.2 % (ref 0.0–0.2)

## 2022-11-23 LAB — IRON AND TIBC
Iron: 18 ug/dL — ABNORMAL LOW (ref 28–170)
Saturation Ratios: 6 % — ABNORMAL LOW (ref 10.4–31.8)
TIBC: 298 ug/dL (ref 250–450)
UIBC: 280 ug/dL

## 2022-11-23 LAB — FERRITIN: Ferritin: 27 ng/mL (ref 11–307)

## 2022-11-23 LAB — PROCALCITONIN: Procalcitonin: 22.77 ng/mL

## 2022-11-23 MED ORDER — DIPHENHYDRAMINE HCL 50 MG/ML IJ SOLN: 50.0000 mg | Freq: Once | INTRAMUSCULAR | Status: DC

## 2022-11-23 MED ORDER — METHYLPREDNISOLONE SODIUM SUCC 125 MG IJ SOLR: 40.0000 mg | Freq: Once | INTRAMUSCULAR | Status: DC

## 2022-11-23 MED ORDER — SODIUM CHLORIDE 0.9 % IV SOLN
250.0000 mg | Freq: Every day | INTRAVENOUS | Status: AC
  Administered 2022-11-23 – 2022-11-26 (×4): 250 mg via INTRAVENOUS
  Filled 2022-11-23 (×4): qty 20

## 2022-11-23 MED ORDER — POTASSIUM CHLORIDE CRYS ER 20 MEQ PO TBCR
40.0000 meq | EXTENDED_RELEASE_TABLET | Freq: Once | ORAL | Status: DC
  Filled 2022-11-23: qty 2

## 2022-11-23 MED ORDER — SODIUM CHLORIDE 0.9% IV SOLUTION: Freq: Once | INTRAVENOUS | Status: AC

## 2022-11-23 MED ORDER — POTASSIUM CHLORIDE CRYS ER 20 MEQ PO TBCR
40.0000 meq | EXTENDED_RELEASE_TABLET | Freq: Once | ORAL | Status: AC
  Administered 2022-11-23: 40 meq via ORAL
  Filled 2022-11-23: qty 2

## 2022-11-23 MED ORDER — BUDESONIDE 0.25 MG/2ML IN SUSP
0.2500 mg | Freq: Two times a day (BID) | RESPIRATORY_TRACT | Status: DC
  Administered 2022-11-23 – 2022-11-29 (×11): 0.25 mg via RESPIRATORY_TRACT
  Filled 2022-11-23 (×13): qty 2

## 2022-11-23 MED ORDER — LEVALBUTEROL HCL 0.63 MG/3ML IN NEBU
0.6300 mg | INHALATION_SOLUTION | Freq: Four times a day (QID) | RESPIRATORY_TRACT | Status: DC | PRN
  Administered 2022-11-25: 0.63 mg via RESPIRATORY_TRACT
  Filled 2022-11-23 (×2): qty 3

## 2022-11-23 MED ORDER — FERROUS SULFATE 325 (65 FE) MG PO TABS
325.0000 mg | ORAL_TABLET | Freq: Every day | ORAL | Status: DC
  Administered 2022-11-27 – 2022-11-29 (×3): 325 mg via ORAL
  Filled 2022-11-23 (×3): qty 1

## 2022-11-23 MED ORDER — DIPHENHYDRAMINE HCL 25 MG PO CAPS: 50.0000 mg | ORAL_CAPSULE | Freq: Once | ORAL | Status: DC

## 2022-11-23 NOTE — Progress Notes (Signed)
PT Cancellation Note  Patient Details Name: Gloria [name] MRN: [id number] DOB: 1928/03/21   Cancelled Treatment:    Reason Eval/Treat Not Completed: Patient at procedure or test/unavailable;Fatigue/lethargy limiting ability to participate. Pt at MRI during 1st attempt. On 2nd attempt, pt sleeping. Family present in room and requesting not to wake her.   Lorriane Shire 11/23/2022, 11:19 AM

## 2022-11-23 NOTE — Progress Notes (Signed)
Gloria [name]   DOB:[birth year]   TI#:[id number]   ASN#:[id number]  Oncology follow up   Subjective: Patient still complains urinary urgency and frequency, low back pain is tolerable, no melena or hematochezia.   Objective:  Vitals:   11/23/22 1559 11/23/22 1912  BP: (!) 142/78 136/81  Pulse: 76   Resp: (!) 21   Temp: 98.3 F (36.8 C) (!) 97 F (36.1 C)  SpO2: 95%     Body mass index is 24.8 kg/m.  Intake/Output Summary (Last 24 hours) at 11/23/2022 2143 Last data filed at 11/23/2022 1850 Gross per 24 hour  Intake 360 ml  Output 250 ml  Net 110 ml     Sclerae unicteric  Oropharynx clear  No peripheral adenopathy  Lungs clear -- no rales or rhonchi  Heart regular rate and rhythm  Abdomen benign  MSK no focal spinal tenderness, (+) left lower extremity edema above ankle and foot   Neuro nonfocal    CBG (last 3)  No results for input(s): "GLUCAP" in the last 72 hours.   Labs:   Urine Studies No results for input(s): "UHGB", "CRYS" in the last 72 hours.  Invalid input(s): "UACOL", "UAPR", "USPG", "UPH", "UTP", "UGL", "UKET", "UBIL", "UNIT", "UROB", "ULEU", "UEPI", "UWBC", "URBC", "UBAC", "CAST", "UCOM", "BILUA"  Basic Metabolic Panel: Recent Labs  Lab 11/21/22 1758 11/22/22 0204 11/22/22 0500 11/23/22 0305  NA 136 135 136 136  K 3.8 3.7 3.8 3.3*  CL 104 104 104 111  CO2 19*  --  20* 18*  GLUCOSE 140* 172* 184* 104*  BUN 28* 23 25* 28*  CREATININE 1.41* 1.10* 1.47* 1.39*  CALCIUM 8.7*  --  8.2* 7.0*  MG 1.7  --  1.8  --   PHOS  --   --  4.2  --    GFR Estimated Creatinine Clearance: 20.5 mL/min (A) (by C-G formula based on SCr of 1.39 mg/dL (H)). Liver Function Tests: Recent Labs  Lab 11/21/22 1758 11/22/22 0500 11/23/22 0305  AST 31 45* 29  ALT '12 18 18  '$ ALKPHOS 74 71 59  BILITOT 0.8 1.3* 0.6  PROT 5.9* 5.5* 4.6*  ALBUMIN 3.3* 2.8* 2.4*   Recent Labs  Lab 11/22/22 0041  LIPASE 26   No results for input(s): "AMMONIA" in the last 168  hours. Coagulation profile Recent Labs  Lab 11/21/22 1758 11/22/22 0500  INR 2.2* 1.9*    CBC: Recent Labs  Lab 11/21/22 1758 11/22/22 0204 11/22/22 0500 11/23/22 0305  WBC 7.1  --  6.3 9.2  NEUTROABS 6.0  --   --   --   HGB 5.6* 9.2* 9.1* 7.0*  HCT 18.4* 27.0* 28.1* 21.2*  MCV 90.6  --  91.5 89.8  PLT 227  --  151 152   Cardiac Enzymes: No results for input(s): "CKTOTAL", "CKMB", "CKMBINDEX", "TROPONINI" in the last 168 hours. BNP: Invalid input(s): "POCBNP" CBG: No results for input(s): "GLUCAP" in the last 168 hours. D-Dimer No results for input(s): "DDIMER" in the last 72 hours. Hgb A1c No results for input(s): "HGBA1C" in the last 72 hours. Lipid Profile No results for input(s): "CHOL", "HDL", "LDLCALC", "TRIG", "CHOLHDL", "LDLDIRECT" in the last 72 hours. Thyroid function studies No results for input(s): "TSH", "T4TOTAL", "T3FREE", "THYROIDAB" in the last 72 hours.  Invalid input(s): "FREET3" Anemia work up Recent Labs    11/23/22 0305  FERRITIN 27  TIBC 298  IRON 18*  RETICCTPCT 3.0   Microbiology Recent Results (from the past 240 hour(s))  Culture,  blood (Routine x 2)     Status: Abnormal (Preliminary result)   Collection Time: 11/21/22  6:07 PM   Specimen: BLOOD  Result Value Ref Range Status   Specimen Description BLOOD RIGHT ANTECUBITAL  Final   Special Requests   Final    BOTTLES DRAWN AEROBIC AND ANAEROBIC Blood Culture adequate volume   Culture  Setup Time   Final    GRAM NEGATIVE RODS IN BOTH AEROBIC AND ANAEROBIC BOTTLES CRITICAL RESULT CALLED TO, READ BACK BY AND VERIFIED WITH: Ferne Coe PHARMD, AT 1212 11/22/22 D. VANHOOK    Culture (A)  Final    KLEBSIELLA PNEUMONIAE SUSCEPTIBILITIES TO FOLLOW Performed at [facility], [place] [address], [place], [place] [address]    Report Status PENDING  Incomplete  Blood Culture ID Panel (Reflexed)     Status: Abnormal   Collection Time: 11/21/22  6:07 PM  Result Value Ref Range Status    Enterococcus faecalis NOT DETECTED NOT DETECTED Final   Enterococcus Faecium NOT DETECTED NOT DETECTED Final   Listeria monocytogenes NOT DETECTED NOT DETECTED Final   Staphylococcus species NOT DETECTED NOT DETECTED Final   Staphylococcus aureus (BCID) NOT DETECTED NOT DETECTED Final   Staphylococcus epidermidis NOT DETECTED NOT DETECTED Final   Staphylococcus lugdunensis NOT DETECTED NOT DETECTED Final   Streptococcus species NOT DETECTED NOT DETECTED Final   Streptococcus agalactiae NOT DETECTED NOT DETECTED Final   Streptococcus pneumoniae NOT DETECTED NOT DETECTED Final   Streptococcus pyogenes NOT DETECTED NOT DETECTED Final   A.calcoaceticus-baumannii NOT DETECTED NOT DETECTED Final   Bacteroides fragilis NOT DETECTED NOT DETECTED Final   Enterobacterales DETECTED (A) NOT DETECTED Final    Comment: Enterobacterales represent a large order of gram negative bacteria, not a single organism. CRITICAL RESULT CALLED TO, READ BACK BY AND VERIFIED WITH: E. MARTIN PHARMD, AT 1212 11/22/22 D. VANHOOK    Enterobacter cloacae complex NOT DETECTED NOT DETECTED Final   Escherichia coli NOT DETECTED NOT DETECTED Final   Klebsiella aerogenes NOT DETECTED NOT DETECTED Final   Klebsiella oxytoca NOT DETECTED NOT DETECTED Final   Klebsiella pneumoniae DETECTED (A) NOT DETECTED Final    Comment: CRITICAL RESULT CALLED TO, READ BACK BY AND VERIFIED WITH: E. MARTIN PHARMD, AT 1212 11/22/22 D. VANHOOK    Proteus species NOT DETECTED NOT DETECTED Final   Salmonella species NOT DETECTED NOT DETECTED Final   Serratia marcescens NOT DETECTED NOT DETECTED Final   Haemophilus influenzae NOT DETECTED NOT DETECTED Final   Neisseria meningitidis NOT DETECTED NOT DETECTED Final   Pseudomonas aeruginosa NOT DETECTED NOT DETECTED Final   Stenotrophomonas maltophilia NOT DETECTED NOT DETECTED Final   Candida albicans NOT DETECTED NOT DETECTED Final   Candida auris NOT DETECTED NOT DETECTED Final   Candida  glabrata NOT DETECTED NOT DETECTED Final   Candida krusei NOT DETECTED NOT DETECTED Final   Candida parapsilosis NOT DETECTED NOT DETECTED Final   Candida tropicalis NOT DETECTED NOT DETECTED Final   Cryptococcus neoformans/gattii NOT DETECTED NOT DETECTED Final   CTX-M ESBL NOT DETECTED NOT DETECTED Final   Carbapenem resistance IMP NOT DETECTED NOT DETECTED Final   Carbapenem resistance KPC NOT DETECTED NOT DETECTED Final   Carbapenem resistance NDM NOT DETECTED NOT DETECTED Final   Carbapenem resist OXA 48 LIKE NOT DETECTED NOT DETECTED Final   Carbapenem resistance VIM NOT DETECTED NOT DETECTED Final    Comment: Performed at [facility], [address], [place], [place] [address]  Resp panel by RT-PCR (  RSV, Flu A&B, Covid) Anterior Nasal Swab     Status: None   Collection Time: 11/21/22  6:08 PM   Specimen: Anterior Nasal Swab  Result Value Ref Range Status   SARS Coronavirus 2 by RT PCR NEGATIVE NEGATIVE Final    Comment: (NOTE) SARS-CoV-2 target nucleic acids are NOT DETECTED.  The SARS-CoV-2 RNA is generally detectable in upper respiratory specimens during the acute phase of infection. The lowest concentration of SARS-CoV-2 viral copies this assay can detect is 138 copies/mL. A negative result does not preclude SARS-Cov-2 infection and should not be used as the sole basis for treatment or other patient management decisions. A negative result may occur with  improper specimen collection/handling, submission of specimen other than nasopharyngeal swab, presence of viral mutation(s) within the areas targeted by this assay, and inadequate number of viral copies(<138 copies/mL). A negative result must be combined with clinical observations, patient history, and epidemiological information. The expected result is Negative.  Fact Sheet for Patients:  EntrepreneurPulse.com.au  Fact Sheet for Healthcare Providers:   IncredibleEmployment.be  This test is no t yet approved or cleared by the [place] FDA and  has been authorized for detection and/or diagnosis of SARS-CoV-2 by FDA under an Emergency Use Authorization (EUA). This EUA will remain  in effect (meaning this test can be used) for the duration of the COVID-19 declaration under Section 564(b)(1) of the Act, 21 U.S.C.section 360bbb-3(b)(1), unless the authorization is terminated  or revoked sooner.       Influenza A by PCR NEGATIVE NEGATIVE Final   Influenza B by PCR NEGATIVE NEGATIVE Final    Comment: (NOTE) The Xpert Xpress SARS-CoV-2/FLU/RSV plus assay is intended as an aid in the diagnosis of influenza from Nasopharyngeal swab specimens and should not be used as a sole basis for treatment. Nasal washings and aspirates are unacceptable for Xpert Xpress SARS-CoV-2/FLU/RSV testing.  Fact Sheet for Patients: EntrepreneurPulse.com.au  Fact Sheet for Healthcare Providers: IncredibleEmployment.be  This test is not yet approved or cleared by the [place] FDA and has been authorized for detection and/or diagnosis of SARS-CoV-2 by FDA under an Emergency Use Authorization (EUA). This EUA will remain in effect (meaning this test can be used) for the duration of the COVID-19 declaration under Section 564(b)(1) of the Act, 21 U.S.C. section 360bbb-3(b)(1), unless the authorization is terminated or revoked.     Resp Syncytial Virus by PCR NEGATIVE NEGATIVE Final    Comment: (NOTE) Fact Sheet for Patients: EntrepreneurPulse.com.au  Fact Sheet for Healthcare Providers: IncredibleEmployment.be  This test is not yet approved or cleared by the [place] FDA and has been authorized for detection and/or diagnosis of SARS-CoV-2 by FDA under an Emergency Use Authorization (EUA). This EUA will remain in effect (meaning this test can be used) for  the duration of the COVID-19 declaration under Section 564(b)(1) of the Act, 21 U.S.C. section 360bbb-3(b)(1), unless the authorization is terminated or revoked.  Performed at [facility], [place] [address] [place], [place] [address]   Urine Culture     Status: Abnormal (Preliminary result)   Collection Time: 11/21/22 10:53 PM   Specimen: In/Out Cath Urine  Result Value Ref Range Status   Specimen Description IN/OUT CATH URINE  Final   Special Requests NONE  Final   Culture (A)  Final    900 COLONIES/mL KLEBSIELLA PNEUMONIAE SUSCEPTIBILITIES TO FOLLOW Performed at [facility], [place] [address], [place], [place] [address]    Report Status PENDING  Incomplete  Studies:  MR ABDOMEN MRCP W WO CONTAST  Result Date: 11/23/2022 CLINICAL DATA:  Acute pancreatitis in a 89 year old female. EXAM: MRI ABDOMEN WITHOUT AND WITH CONTRAST (INCLUDING MRCP) TECHNIQUE: Multiplanar multisequence MR imaging of the abdomen was performed both before and after the administration of intravenous contrast. Heavily T2-weighted images of the biliary and pancreatic ducts were obtained, and three-dimensional MRCP images were rendered by post processing. CONTRAST:  7.68m GADAVIST GADOBUTROL 1 MMOL/ML IV SOLN COMPARISON:  November 21, 2021 CT of the chest, and ultrasound evaluation which was performed on November 22, 2021 FINDINGS: Lower chest: Small LEFT and trace RIGHT effusions.  Cardiomegaly. Hepatobiliary: Sludge in the gallbladder. No biliary duct dilation. Smooth segmental attenuation of the common bile duct in the pancreas but without biliary duct dilation involving intrahepatic biliary tree. No choledocholithiasis. Study limited by motion related artifact in this debilitated patient. No focal, suspicious hepatic lesion. Portal vein is patent. Hepatic arterial supply derive from celiac axis in a classic fashion. Pancreas: Pancreas with maintained intrinsic T1 signal and without signs of pancreatic  atrophy or ductal dilation. No focal pancreatic lesion. Spleen: Normal size with lobular contour which is unchanged compared to previous imaging. Adrenals/Urinary Tract: LEFT adrenal gland is obscured by diffuse soft tissue and stranding in the LEFT retroperitoneum. This stranding and soft tissue encases the aorta, celiac and SMA and tracks to the RIGHT of the midline also involving the RIGHT adrenal gland. Small amounts of blood in the collecting systems of the RIGHT kidney post stent placement. Urothelial enhancement along the course of the RIGHT ureter. No discrete suspicious renal lesion. Mild LEFT hydronephrosis similar to prior imaging. RIGHT hydronephrosis persists but is improved following stent placement. Course of the RIGHT ureter appears medially deviated towards the IVC which is diminutive. Stomach/Bowel: No acute gastrointestinal process to the extent evaluated on this abdominal MRI. Vascular/Lymphatic: Narrowing of the IVC due to soft tissue in the retroperitoneum. Encased retroperitoneal and mesenteric vasculature. LEFT retroperitoneal soft tissue seen on previous PET imaging now contiguous with this diffuse soft tissue that surrounds vasculature in the retroperitoneum. No discrete adenopathy. When compared to prior PET imaging there is much more diffuse soft tissue and the IVC is clearly narrowed compared to imaging from August. There is also encasement of the LEFT renal vein. Renal enhancement remain symmetric. Large cyst arises from the upper pole the LEFT kidney not substantially changed more remote imaging. Other: Small volume ascites in the LEFT upper quadrant at about the spleen. Diffuse body wall edema. Noted on coronal images diffuse enhancement with near nodular appearance of the renal fascia on the LEFT is noted, this process largely spares the RIGHT kidney and perinephric space affecting the RIGHT adrenal to some extent as outlined above. Musculoskeletal: No suspicious bone lesions  identified. There is in infiltrative appearance of nodular soft tissue along the anterior margin of the spine and LEFT psoas, quadratus lumborum and LEFT hemidiaphragm. IMPRESSION: 1. Increase in retroperitoneal soft tissue now encasing vascular structures and narrowing venous structures in the retroperitoneum and affecting LEFT perinephric space greater than RIGHT. Findings are concerning for process such is IgG 4 related disease versus is infiltrative breast cancer metastasis. 2. Marked LEFT-sided perinephric stranding with persistent longstanding hydronephrosis related to above process. Superimposed pyelonephritis is considered in this patient with urosepsis now post RIGHT nephroureteral stenting. 3. Small volume ascites in the LEFT upper quadrant at about the spleen. Difficult to exclude the possibility of small amounts of blood products within this fluid given density on recent  chest abdomen and pelvis CT though some of this density could be attributed to presence of soft tissue which is shown to enhance in these areas. 4. Small LEFT and trace RIGHT effusions. 5. Diffuse body wall edema. 6. Sludge in the gallbladder. No biliary duct dilation involving intrahepatic biliary tree. No choledocholithiasis. There is some mild irregularity with respect to biliary narrowing, a finding that could also be seen in the setting of IgG 4 related disease. 7. While stranding is seen about the pancreas pancreas shows remarkably normal appearing intrinsic T1 signal. Pancreatitis is unlikely but continued correlation with pancreatic enzymes may be helpful to allow for exclusion of this possibility. 8. Given presence of bilateral effusions and generalized edema would also continued to assess for changes in clinical markers of heart failure. Narrowing of the IVC could lead to diffuse edema as well 9. Persistent but improved RIGHT-sided hydronephrosis. These results will be called to the ordering clinician or representative by the  Radiologist Assistant, and communication documented in the PACS or [employer]. Electronically Signed   By: Zetta Bills M.D.   On: 11/23/2022 08:26   VAS [place] LOWER EXTREMITY VENOUS (DVT)  Result Date: 11/22/2022  Lower Venous DVT Study Patient Name:  ZAKAYLA MARTINEC  Date of Exam:   11/22/2022 Medical Rec #: [id number]      Accession #:    [id number] Date of Birth: 1928-02-12      Patient Gender: F Patient Age:   29 years Exam Location:  [facility] Procedure:      VAS [place] LOWER EXTREMITY VENOUS (DVT) Referring Phys: Terrilee Croak --------------------------------------------------------------------------------  Indications: Swelling, Eliquis discontinued secondary to bleeding.  Risk Factors: None identified. Limitations: Patient pain/guarding. Comparison Study: 09-27-2022 Right lower extremity venous study was positive for                   DVT. Performing Technologist: Darlin Coco RDMS, RVT  Examination Guidelines: A complete evaluation includes B-mode imaging, spectral Doppler, color Doppler, and power Doppler as needed of all accessible portions of each vessel. Bilateral testing is considered an integral part of a complete examination. Limited examinations for reoccurring indications may be performed as noted. The reflux portion of the exam is performed with the patient in reverse Trendelenburg.  +---------+---------------+---------+-----------+----------+--------------+ RIGHT    CompressibilityPhasicitySpontaneityPropertiesThrombus Aging +---------+---------------+---------+-----------+----------+--------------+ CFV      Full           Yes      Yes                                 +---------+---------------+---------+-----------+----------+--------------+ SFJ      Full                                                        +---------+---------------+---------+-----------+----------+--------------+ FV Prox  Full                                                         +---------+---------------+---------+-----------+----------+--------------+ FV Mid   Full                                                        +---------+---------------+---------+-----------+----------+--------------+  FV DistalFull                                                        +---------+---------------+---------+-----------+----------+--------------+ PFV      Full                                                        +---------+---------------+---------+-----------+----------+--------------+ POP      Full           Yes      Yes                                 +---------+---------------+---------+-----------+----------+--------------+ PTV      Full                                                        +---------+---------------+---------+-----------+----------+--------------+ PERO     Full                                                        +---------+---------------+---------+-----------+----------+--------------+ Gastroc  None           No       No                   Acute          +---------+---------------+---------+-----------+----------+--------------+   +---------+---------------+---------+-----------+----------+-----------------+ LEFT     CompressibilityPhasicitySpontaneityPropertiesThrombus Aging    +---------+---------------+---------+-----------+----------+-----------------+ CFV      Full           Yes      Yes                                    +---------+---------------+---------+-----------+----------+-----------------+ SFJ      Full                                                           +---------+---------------+---------+-----------+----------+-----------------+ FV Prox  Full                                                           +---------+---------------+---------+-----------+----------+-----------------+ FV Mid   Full                                                            +---------+---------------+---------+-----------+----------+-----------------+  FV DistalFull                                                           +---------+---------------+---------+-----------+----------+-----------------+ PFV      Full                                                           +---------+---------------+---------+-----------+----------+-----------------+ POP      Full           Yes      Yes                                    +---------+---------------+---------+-----------+----------+-----------------+ PTV      Full                                                           +---------+---------------+---------+-----------+----------+-----------------+ PERO     Partial        Yes      Yes                  Age Indeterminate +---------+---------------+---------+-----------+----------+-----------------+ Gastroc  Full                                                           +---------+---------------+---------+-----------+----------+-----------------+     Summary: RIGHT: - Findings consistent with acute deep vein thrombosis involving the right gastrocnemius veins. - No cystic structure found in the popliteal fossa.  LEFT: - Findings consistent with age indeterminate deep vein thrombosis involving the left peroneal veins. - No cystic structure found in the popliteal fossa.  *See table(s) above for measurements and observations. Electronically signed by Harold Barban MD on 11/22/2022 at 8:57:23 PM.    Final    US Abdomen Limited RUQ (LIVER/GB)  Result Date: 11/22/2022 CLINICAL DATA:  Elevated liver enzymes EXAM: ULTRASOUND ABDOMEN LIMITED RIGHT UPPER QUADRANT COMPARISON:  CT 11/21/2022 FINDINGS: Gallbladder: Distended gallbladder without wall thickening or pericholecystic fluid. No intraluminal gallstones identified. Negative sonographic Murphy sign. Common bile duct: Diameter: 3.9 mm, normal.  No intrahepatic ductal dilation. Liver: Increased liver  echogenicity. No focal lesion identified. Portal vein is patent on color Doppler imaging with normal direction of blood flow towards the liver. Other: None. IMPRESSION: Hepatic steatosis. No evidence of cholecystitis or biliary obstruction. Electronically Signed   By: Maurine Simmering M.D.   On: 11/22/2022 10:30   DG C-Arm 1-60 Min-No Report  Result Date: 11/22/2022 Fluoroscopy was utilized by the requesting physician.  No radiographic interpretation.   CT CHEST ABDOMEN PELVIS WO CONTRAST  Result Date: 11/21/2022 CLINICAL DATA:  Sepsis.  History of breast cancer. EXAM: CT CHEST, ABDOMEN AND PELVIS WITHOUT CONTRAST TECHNIQUE: Multidetector CT imaging of the chest,  abdomen and pelvis was performed following the standard protocol without IV contrast. RADIATION DOSE REDUCTION: This exam was performed according to the departmental dose-optimization program which includes automated exposure control, adjustment of the mA and/or kV according to patient size and/or use of iterative reconstruction technique. COMPARISON:  CT chest abdomen and pelvis 09/19/2017 FINDINGS: CT CHEST FINDINGS Cardiovascular: No significant vascular findings. Normal heart size. No pericardial effusion. There are atherosclerotic calcifications of the aorta. Mediastinum/Nodes: There is a 6 mm hypodense right thyroid nodule, unchanged. There are no enlarged mediastinal, hilar or axillary lymph nodes. Lungs/Pleura: Mild emphysematous changes are seen in the lung apices. There is a small amount of atelectasis and airspace disease in the lingula. There is a trace left pleural effusion. There is dependent atelectasis in both lower lobes. Musculoskeletal: No acute fracture or focal osseous lesion. Left mastectomy changes are present. There surgical clips in the left axilla. CT ABDOMEN PELVIS FINDINGS Hepatobiliary: No focal liver abnormality is seen. No gallstones, gallbladder wall thickening, or biliary dilatation. Pancreas: There is inflammatory  stranding surrounding the entire pancreas. There is no fluid collection or ductal dilatation. Spleen: Normal in size without focal abnormality. Adrenals/Urinary Tract: There is bladder wall thickening with mild surrounding inflammatory stranding. There is bilateral perinephric fat stranding, left greater than right. There is also some hyperdensity and stranding in the left pararenal space. There is a 2 mm calculus in the proximal right ureter image 3/72 with mild right-sided hydronephrosis. There is no left-sided hydronephrosis. Left renal cysts are unchanged measuring up 2 4.5 cm. Stomach/Bowel: There is inflammatory stranding surrounding the duodenal and proximal small bowel as it approximates pancreas. There is also some wall thickening of the descending colon with mild surrounding inflammatory stranding. There is sigmoid colon diverticulosis without evidence for diverticulitis. The appendix is not seen. There is no pneumatosis, bowel obstruction or free air. Stomach is decompressed. Vascular/Lymphatic: Aorta and IVC are normal in size. There are atherosclerotic calcifications of the aorta. There is diffuse retroperitoneal/para-aortic inflammatory stranding which is new from prior. No discrete enlarged lymph nodes are identified. Reproductive: Status post hysterectomy. No adnexal masses. Other: There is a small fat containing umbilical hernia. There is mild body wall edema. There is no ascites. Musculoskeletal: There are degenerative changes of the lower lumbar spine. IMPRESSION: 1. Diffuse inflammatory stranding surrounding the pancreas, duodenum, proximal small bowel, and descending colon. Findings are worrisome for acute pancreatitis. 2. Bladder wall thickening with surrounding inflammatory stranding worrisome for cystitis. 3. 2 mm calculus in the proximal right ureter with mild right-sided hydronephrosis. 4. Trace left pleural effusion. 5. Small amount of airspace disease in the lingula may represent  atelectasis or infection. 6. New diffuse retroperitoneal stranding and left pararenal stranding of uncertain etiology. Findings may be related to retroperitoneal hemorrhage, infectious/inflammatory process, or less likely retroperitoneal fibrosis. 7. Mild body wall edema. Aortic Atherosclerosis (ICD10-I70.0) and Emphysema (ICD10-J43.9). Electronically Signed   By: Ronney Asters M.D.   On: 11/21/2022 23:29    Assessment: 89 y.o. female   Severe sepsis secondary to UTI and ureteral stone, post ureteral stent placement Acute blood loss anemia, probably secondary to anticoagulation RP adenopathy and infiltrative changes, concerning for metastatic breast cancer Suspected aspiration pneumonia, with hypoxia AKI secondary to dehydration Metastatic breast cancer, on supportive care Acute right lower extremity DVT and age-indeterminate her left lower extremity DVT Anxiety, depression    Plan:  -her H/H dropped further today, GI discussed with IR regarding possible internal (RP) bleeding? IR Dr. Pascal Lux does not recommend  CTA due to her severe allergy to CT contrast.  Embolization is felt to be not indicated -Based on my reading of her recent MRI and CT scans, I do not feel the retroperitoneal infiltrative change is hematoma, I am more concerned this could be metastatic breast cancer, which has progressed since her last PET in 05/2022. I think this probably caused her bilateral lower extremity edema and DVT.  Certainly inflammation change is also possibility, although now sure what can cause that -Due to her advanced age, and poor performance status, I do not recommend biopsy.  She is not a candidate for cancer treatment even if we prove it.  Patient and her family are all in agreement. -please continue blood transfusion to keep hemoglobin above 8 -Today's labs showed no strong evidence of hemolysis, haptoglobin still pending. -I think her anemia is likely related to recent anticoagulation. Although she has  DVT, anticoagulation is likely risk more than benefit. We can consider IVC filter  -I discussed palliative care and hospice on discharge.  Her family are open to palliative care, but not ready for hospice. This is understandable.  -pt's main complaint is urinary frequency and urgency, she is currently being treated for UTI, will defer her bladder symptomatic management to urology and primary team  -I will f/u as needed    Truitt Merle, MD 11/23/2022  9:43 PM

## 2022-11-23 NOTE — Progress Notes (Signed)
Foley removed per order from urology.

## 2022-11-23 NOTE — Progress Notes (Signed)
Spoke with the resident of Dr. Louis Meckel about the issues we are having with the patient's foley and he instructed me to D/C the foley.

## 2022-11-23 NOTE — Progress Notes (Addendum)
[facility] Progress Note  CC:  Anemia, ? pancreatitis  Subjective:   Complaining of abdominal pain into her back.  Family at bedside.  Objective:  Vital signs in last 24 hours: Temp:  [97.7 F (36.5 C)-98.6 F (37 C)] 98.4 F (36.9 C) (01/25 1128) Pulse Rate:  [71-81] 71 (01/25 1128) Resp:  [15-22] 19 (01/25 1128) BP: (105-146)/(64-90) 105/65 (01/25 1128) SpO2:  [92 %-100 %] 92 % (01/25 1128) Last BM Date :  (PTA) General:  Alert, elderly, in NAD Heart:  Regular rate and rhythm; no murmurs Pulm:  CTAB.  No W/R/R. Abdomen:  Soft, non-distended.  BS present.  Non-tender. Extremities:  Some B/L LE edema. Neurologic:  Alert and oriented x 4;  grossly normal neurologically. Psych:  Alert and cooperative. Normal mood and affect.  Intake/Output from previous day: 01/24 0701 - 01/25 0700 In: -  Out: 250 [Urine:250] Intake/Output this shift: Total I/O In: 120 [P.O.:120] Out: -   Lab Results: Recent Labs    11/21/22 1758 11/22/22 0204 11/22/22 0500 11/23/22 0305  WBC 7.1  --  6.3 9.2  HGB 5.6* 9.2* 9.1* 7.0*  HCT 18.4* 27.0* 28.1* 21.2*  PLT 227  --  151 152   BMET Recent Labs    11/21/22 1758 11/22/22 0204 11/22/22 0500 11/23/22 0305  NA 136 135 136 136  K 3.8 3.7 3.8 3.3*  CL 104 104 104 111  CO2 19*  --  20* 18*  GLUCOSE 140* 172* 184* 104*  BUN 28* 23 25* 28*  CREATININE 1.41* 1.10* 1.47* 1.39*  CALCIUM 8.7*  --  8.2* 7.0*   LFT Recent Labs    11/23/22 0305  PROT 4.6*  ALBUMIN 2.4*  AST 29  ALT 18  ALKPHOS 59  BILITOT 0.6   PT/INR Recent Labs    11/21/22 1758 11/22/22 0500  LABPROT 24.2* 21.6*  INR 2.2* 1.9*   MR ABDOMEN MRCP W WO CONTAST  Result Date: 11/23/2022 CLINICAL DATA:  Acute pancreatitis in a 89 year old female. EXAM: MRI ABDOMEN WITHOUT AND WITH CONTRAST (INCLUDING MRCP) TECHNIQUE: Multiplanar multisequence MR imaging of the abdomen was performed both before and after the administration of intravenous  contrast. Heavily T2-weighted images of the biliary and pancreatic ducts were obtained, and three-dimensional MRCP images were rendered by post processing. CONTRAST:  7.52m GADAVIST GADOBUTROL 1 MMOL/ML IV SOLN COMPARISON:  November 21, 2021 CT of the chest, and ultrasound evaluation which was performed on November 22, 2021 FINDINGS: Lower chest: Small LEFT and trace RIGHT effusions.  Cardiomegaly. Hepatobiliary: Sludge in the gallbladder. No biliary duct dilation. Smooth segmental attenuation of the common bile duct in the pancreas but without biliary duct dilation involving intrahepatic biliary tree. No choledocholithiasis. Study limited by motion related artifact in this debilitated patient. No focal, suspicious hepatic lesion. Portal vein is patent. Hepatic arterial supply derive from celiac axis in a classic fashion. Pancreas: Pancreas with maintained intrinsic T1 signal and without signs of pancreatic atrophy or ductal dilation. No focal pancreatic lesion. Spleen: Normal size with lobular contour which is unchanged compared to previous imaging. Adrenals/Urinary Tract: LEFT adrenal gland is obscured by diffuse soft tissue and stranding in the LEFT retroperitoneum. This stranding and soft tissue encases the aorta, celiac and SMA and tracks to the RIGHT of the midline also involving the RIGHT adrenal gland. Small amounts of blood in the collecting systems of the RIGHT kidney post stent placement. Urothelial enhancement along the course of the RIGHT ureter. No discrete suspicious renal  lesion. Mild LEFT hydronephrosis similar to prior imaging. RIGHT hydronephrosis persists but is improved following stent placement. Course of the RIGHT ureter appears medially deviated towards the IVC which is diminutive. Stomach/Bowel: No acute gastrointestinal process to the extent evaluated on this abdominal MRI. Vascular/Lymphatic: Narrowing of the IVC due to soft tissue in the retroperitoneum. Encased retroperitoneal and  mesenteric vasculature. LEFT retroperitoneal soft tissue seen on previous PET imaging now contiguous with this diffuse soft tissue that surrounds vasculature in the retroperitoneum. No discrete adenopathy. When compared to prior PET imaging there is much more diffuse soft tissue and the IVC is clearly narrowed compared to imaging from August. There is also encasement of the LEFT renal vein. Renal enhancement remain symmetric. Large cyst arises from the upper pole the LEFT kidney not substantially changed more remote imaging. Other: Small volume ascites in the LEFT upper quadrant at about the spleen. Diffuse body wall edema. Noted on coronal images diffuse enhancement with near nodular appearance of the renal fascia on the LEFT is noted, this process largely spares the RIGHT kidney and perinephric space affecting the RIGHT adrenal to some extent as outlined above. Musculoskeletal: No suspicious bone lesions identified. There is in infiltrative appearance of nodular soft tissue along the anterior margin of the spine and LEFT psoas, quadratus lumborum and LEFT hemidiaphragm. IMPRESSION: 1. Increase in retroperitoneal soft tissue now encasing vascular structures and narrowing venous structures in the retroperitoneum and affecting LEFT perinephric space greater than RIGHT. Findings are concerning for process such is IgG 4 related disease versus is infiltrative breast cancer metastasis. 2. Marked LEFT-sided perinephric stranding with persistent longstanding hydronephrosis related to above process. Superimposed pyelonephritis is considered in this patient with urosepsis now post RIGHT nephroureteral stenting. 3. Small volume ascites in the LEFT upper quadrant at about the spleen. Difficult to exclude the possibility of small amounts of blood products within this fluid given density on recent chest abdomen and pelvis CT though some of this density could be attributed to presence of soft tissue which is shown to enhance in  these areas. 4. Small LEFT and trace RIGHT effusions. 5. Diffuse body wall edema. 6. Sludge in the gallbladder. No biliary duct dilation involving intrahepatic biliary tree. No choledocholithiasis. There is some mild irregularity with respect to biliary narrowing, a finding that could also be seen in the setting of IgG 4 related disease. 7. While stranding is seen about the pancreas pancreas shows remarkably normal appearing intrinsic T1 signal. Pancreatitis is unlikely but continued correlation with pancreatic enzymes may be helpful to allow for exclusion of this possibility. 8. Given presence of bilateral effusions and generalized edema would also continued to assess for changes in clinical markers of heart failure. Narrowing of the IVC could lead to diffuse edema as well 9. Persistent but improved RIGHT-sided hydronephrosis. These results will be called to the ordering clinician or representative by the Radiologist Assistant, and communication documented in the PACS or [employer]. Electronically Signed   By: Zetta Bills M.D.   On: 11/23/2022 08:26   VAS [place] LOWER EXTREMITY VENOUS (DVT)  Result Date: 11/22/2022  Lower Venous DVT Study Patient Name:  NARJIS MIRA  Date of Exam:   11/22/2022 Medical Rec #: [id number]      Accession #:    [id number] Date of Birth: [birth year]      Patient Gender: F Patient Age:   63 years Exam [facility] Procedure:      VAS [place] LOWER EXTREMITY VENOUS (DVT) Referring  Phys: Terrilee Croak --------------------------------------------------------------------------------  Indications: Swelling, Eliquis discontinued secondary to bleeding.  Risk Factors: None identified. Limitations: Patient pain/guarding. Comparison Study: 09-27-2022 Right lower extremity venous study was positive for                   DVT. Performing Technologist: Darlin Coco RDMS, RVT  Examination Guidelines: A complete evaluation includes B-mode imaging, spectral Doppler, color Doppler, and  power Doppler as needed of all accessible portions of each vessel. Bilateral testing is considered an integral part of a complete examination. Limited examinations for reoccurring indications may be performed as noted. The reflux portion of the exam is performed with the patient in reverse Trendelenburg.  +---------+---------------+---------+-----------+----------+--------------+ RIGHT    CompressibilityPhasicitySpontaneityPropertiesThrombus Aging +---------+---------------+---------+-----------+----------+--------------+ CFV      Full           Yes      Yes                                 +---------+---------------+---------+-----------+----------+--------------+ SFJ      Full                                                        +---------+---------------+---------+-----------+----------+--------------+ FV Prox  Full                                                        +---------+---------------+---------+-----------+----------+--------------+ FV Mid   Full                                                        +---------+---------------+---------+-----------+----------+--------------+ FV DistalFull                                                        +---------+---------------+---------+-----------+----------+--------------+ PFV      Full                                                        +---------+---------------+---------+-----------+----------+--------------+ POP      Full           Yes      Yes                                 +---------+---------------+---------+-----------+----------+--------------+ PTV      Full                                                        +---------+---------------+---------+-----------+----------+--------------+  PERO     Full                                                        +---------+---------------+---------+-----------+----------+--------------+ Gastroc  None           No       No                    Acute          +---------+---------------+---------+-----------+----------+--------------+   +---------+---------------+---------+-----------+----------+-----------------+ LEFT     CompressibilityPhasicitySpontaneityPropertiesThrombus Aging    +---------+---------------+---------+-----------+----------+-----------------+ CFV      Full           Yes      Yes                                    +---------+---------------+---------+-----------+----------+-----------------+ SFJ      Full                                                           +---------+---------------+---------+-----------+----------+-----------------+ FV Prox  Full                                                           +---------+---------------+---------+-----------+----------+-----------------+ FV Mid   Full                                                           +---------+---------------+---------+-----------+----------+-----------------+ FV DistalFull                                                           +---------+---------------+---------+-----------+----------+-----------------+ PFV      Full                                                           +---------+---------------+---------+-----------+----------+-----------------+ POP      Full           Yes      Yes                                    +---------+---------------+---------+-----------+----------+-----------------+ PTV      Full                                                           +---------+---------------+---------+-----------+----------+-----------------+  PERO     Partial        Yes      Yes                  Age Indeterminate +---------+---------------+---------+-----------+----------+-----------------+ Gastroc  Full                                                           +---------+---------------+---------+-----------+----------+-----------------+     Summary: RIGHT: -  Findings consistent with acute deep vein thrombosis involving the right gastrocnemius veins. - No cystic structure found in the popliteal fossa.  LEFT: - Findings consistent with age indeterminate deep vein thrombosis involving the left peroneal veins. - No cystic structure found in the popliteal fossa.  *See table(s) above for measurements and observations. Electronically signed by Harold Barban MD on 11/22/2022 at 8:57:23 PM.    Final    US Abdomen Limited RUQ (LIVER/GB)  Result Date: 11/22/2022 CLINICAL DATA:  Elevated liver enzymes EXAM: ULTRASOUND ABDOMEN LIMITED RIGHT UPPER QUADRANT COMPARISON:  CT 11/21/2022 FINDINGS: Gallbladder: Distended gallbladder without wall thickening or pericholecystic fluid. No intraluminal gallstones identified. Negative sonographic Murphy sign. Common bile duct: Diameter: 3.9 mm, normal.  No intrahepatic ductal dilation. Liver: Increased liver echogenicity. No focal lesion identified. Portal vein is patent on color Doppler imaging with normal direction of blood flow towards the liver. Other: None. IMPRESSION: Hepatic steatosis. No evidence of cholecystitis or biliary obstruction. Electronically Signed   By: Maurine Simmering M.D.   On: 11/22/2022 10:30   DG C-Arm 1-60 Min-No Report  Result Date: 11/22/2022 Fluoroscopy was utilized by the requesting physician.  No radiographic interpretation.   CT CHEST ABDOMEN PELVIS WO CONTRAST  Result Date: 11/21/2022 CLINICAL DATA:  Sepsis.  History of breast cancer. EXAM: CT CHEST, ABDOMEN AND PELVIS WITHOUT CONTRAST TECHNIQUE: Multidetector CT imaging of the chest, abdomen and pelvis was performed following the standard protocol without IV contrast. RADIATION DOSE REDUCTION: This exam was performed according to the departmental dose-optimization program which includes automated exposure control, adjustment of the mA and/or kV according to patient size and/or use of iterative reconstruction technique. COMPARISON:  CT chest abdomen and  pelvis 09/19/2017 FINDINGS: CT CHEST FINDINGS Cardiovascular: No significant vascular findings. Normal heart size. No pericardial effusion. There are atherosclerotic calcifications of the aorta. Mediastinum/Nodes: There is a 6 mm hypodense right thyroid nodule, unchanged. There are no enlarged mediastinal, hilar or axillary lymph nodes. Lungs/Pleura: Mild emphysematous changes are seen in the lung apices. There is a small amount of atelectasis and airspace disease in the lingula. There is a trace left pleural effusion. There is dependent atelectasis in both lower lobes. Musculoskeletal: No acute fracture or focal osseous lesion. Left mastectomy changes are present. There surgical clips in the left axilla. CT ABDOMEN PELVIS FINDINGS Hepatobiliary: No focal liver abnormality is seen. No gallstones, gallbladder wall thickening, or biliary dilatation. Pancreas: There is inflammatory stranding surrounding the entire pancreas. There is no fluid collection or ductal dilatation. Spleen: Normal in size without focal abnormality. Adrenals/Urinary Tract: There is bladder wall thickening with mild surrounding inflammatory stranding. There is bilateral perinephric fat stranding, left greater than right. There is also some hyperdensity and stranding in the left pararenal space. There is a 2 mm calculus in the proximal right ureter image 3/72 with mild right-sided hydronephrosis. There is no  left-sided hydronephrosis. Left renal cysts are unchanged measuring up 2 4.5 cm. Stomach/Bowel: There is inflammatory stranding surrounding the duodenal and proximal small bowel as it approximates pancreas. There is also some wall thickening of the descending colon with mild surrounding inflammatory stranding. There is sigmoid colon diverticulosis without evidence for diverticulitis. The appendix is not seen. There is no pneumatosis, bowel obstruction or free air. Stomach is decompressed. Vascular/Lymphatic: Aorta and IVC are normal in size.  There are atherosclerotic calcifications of the aorta. There is diffuse retroperitoneal/para-aortic inflammatory stranding which is new from prior. No discrete enlarged lymph nodes are identified. Reproductive: Status post hysterectomy. No adnexal masses. Other: There is a small fat containing umbilical hernia. There is mild body wall edema. There is no ascites. Musculoskeletal: There are degenerative changes of the lower lumbar spine. IMPRESSION: 1. Diffuse inflammatory stranding surrounding the pancreas, duodenum, proximal small bowel, and descending colon. Findings are worrisome for acute pancreatitis. 2. Bladder wall thickening with surrounding inflammatory stranding worrisome for cystitis. 3. 2 mm calculus in the proximal right ureter with mild right-sided hydronephrosis. 4. Trace left pleural effusion. 5. Small amount of airspace disease in the lingula may represent atelectasis or infection. 6. New diffuse retroperitoneal stranding and left pararenal stranding of uncertain etiology. Findings may be related to retroperitoneal hemorrhage, infectious/inflammatory process, or less likely retroperitoneal fibrosis. 7. Mild body wall edema. Aortic Atherosclerosis (ICD10-I70.0) and Emphysema (ICD10-J43.9). Electronically Signed   By: Ronney Asters M.D.   On: 11/21/2022 23:29   DG Chest Port 1 View  Result Date: 11/21/2022 CLINICAL DATA:  Sepsis, shortness of breath.  Breast cancer. EXAM: PORTABLE CHEST 1 VIEW COMPARISON:  09/06/2022 FINDINGS: Reverse lordotic projection. Mild atherosclerotic vascular calcification of the thoracic aorta. Left axillary clips. Lingular scarring or atelectasis along the left heart border, similar to prior exams. IMPRESSION: 1. Lingular scarring or atelectasis along the left heart border, similar to prior exams. 2. Mild atherosclerotic vascular calcification of the thoracic aorta. Electronically Signed   By: Van Clines M.D.   On: 11/21/2022 18:28    Assessment / Plan: CT w  changes of pancreatitis but Lipase normal and most of her pain has been in RLQ.  GB intact and unremarkable on [place].  Several weeks of nausea w/o vomiting.  No ETOH of significance.  Only new med eliquis.  MR actually showing NO pancreatitis.    Anemia.  Hgb drop 5.5 g over 7 weeks.  Increased to 9.2 grams after ? One or two units of PRBCs but today back down to 7.0 grams so receiving another unit.     Reported black stool in setting of chronic constipation.  FOBT +.     Eliquis for DVT starting late nov/early December 2023    Klebsiella, enterobacter UTI.  Non obst ureteral stone.  ? Pyelo also on MRI.     Remote resection left colon/sigmoid for diverticulosis.      GERD hx.  Famotidine at home.       Anxiety.    History of breast cancer   MRI findings:  Increase in retroperitoneal soft tissue now encasing vascular structures and narrowing venous structures in the retroperitoneum and affecting LEFT perinephric space greater than RIGHT. Findings are concerning for process such is IgG 4 related disease versus is infiltrative breast cancer metastasis.  -No plans for EGD for now. -Pantoprazole 40 mg IV BID for now. -Await [name] input. -Apparently she cannot get IV contrast for a CT angio with her history of anaphylaxis even with pre-medication  per IR.  They recommended possibly repeating non-contrast CT scan again in a day or two to compare apples to apples.   LOS: 1 day   Gloria [name]. Gloria [name]  11/23/2022, 1:11 PM   Attending physician's note   I have taken a history, reviewed the chart and examined the patient. I performed a substantive portion of this encounter, including complete performance of at least one of the key components, in conjunction with the APP. I agree with the APP's note, impression and recommendations.    MRI by concerning for increased retroperitoneal soft tissue encasing major vascular structures including IVC, also has ascites cannot exclude blood She has not had a  bowel movement for past 4 to 5 days but continues to have significant decline  >6-7 units in hemoglobin.  If this is GI bleed we should be seeing output, melena or hematochezia which she has not had since admission.   Presentation is concerning for RP bleed C/o worsening flank, lower abdominal and low back pain  She has anaphylactic reaction to IV contrast, cannot undergo CTA   Will plan to repeat non contrast CT abd & pelvis tomorrow Monitor Hgb and transfuse as needed  Continue to hold anticoagulation  No plan for endoscopic evaluation at this point   The patient and family at bedside were provided an opportunity to ask questions and all were answered. They agreed with the plan and demonstrated an understanding of the instructions.   Damaris Hippo , MD [phone]

## 2022-11-23 NOTE — Evaluation (Signed)
Occupational Therapy Evaluation Patient Details Name: Gloria [name] MRN: [id number] DOB: 1928/02/16 Today's Date: 11/23/2022   History of Present Illness 89 y.o. female presents to [facility] hospital on 11/21/2022 with confusion, fever, dysuria and abdominal pain. CT scan demonstrates retroperitoneal stranding and worsening R hydronephrosis with a 34m proximal ureteral stone. Pt also noted to be anemic, concern for GIB. Pt underwent ureteral stent placement on 1/24. PMH includes GERD, HTN, UTI, anxiety, CAD.   Clinical Impression   Patient admitted for the diagnosis above.  PTA she lives with family, and has assist as needed for mobility, toileting or ADL.  Deficits impacting independence are listed below.  Currently she is needing closer to Mod A for lower body ADL, and [place] to [place] in [place] room.  OT will follow in the acute setting to address deficits listed, and assist with eventual transition to the next level of care.  HH OT versus SNF is being considered.  If family is able to provide the needed up to Mod A, home is a possibility, if not, OT recommends a short rehab stay to maximize her functional status and minimize caregiver burden.          Recommendations for follow up therapy are one component of a multi-disciplinary discharge planning process, led by the attending physician.  Recommendations may be updated based on patient status, additional functional criteria and insurance authorization.   Follow Up Recommendations  Other (comment) (SNF vs HH OT depending on progress)     Assistance Recommended at Discharge Frequent or constant Supervision/Assistance  Patient can return home with the following Help with stairs or ramp for entrance;A little help with bathing/dressing/bathroom;A little help with walking and/or transfers;Direct supervision/assist for medications management;Direct supervision/assist for financial management;Assist for transportation;Assistance with  cooking/housework    Functional Status Assessment  Patient has had a recent decline in their functional status and demonstrates the ability to make significant improvements in function in a reasonable and predictable amount of time.  Equipment Recommendations  None recommended by OT    Recommendations for Other Services       Precautions / Restrictions Precautions Precautions: Fall Restrictions Weight Bearing Restrictions: No      Mobility Bed Mobility Overal bed mobility: Needs Assistance Bed Mobility: Supine to Sit     Supine to sit: Supervision, HOB elevated          Transfers Overall transfer level: Needs assistance Equipment used: Rolling walker (2 wheels) Transfers: Sit to/from Stand, Bed to chair/wheelchair/BSC Sit to Stand: Min guard     Step pivot transfers: Min assist     General transfer comment: balance back on her heels      Balance Overall balance assessment: Needs assistance Sitting-balance support: No upper extremity supported, Feet supported Sitting balance-Leahy Scale: Good     Standing balance support: Reliant on assistive device for balance Standing balance-Leahy Scale: Poor                             ADL either performed or assessed with clinical judgement   ADL Overall ADL's : Needs assistance/impaired     Grooming: Wash/dry hands;Wash/dry face;Set up;Sitting   Upper Body Bathing: Set up;Sitting   Lower Body Bathing: Moderate assistance;Sit to/from stand   Upper Body Dressing : Set up;Sitting   Lower Body Dressing: Moderate assistance;Sit to/from stand   Toilet Transfer: Minimal assistance;Rolling walker (2 wheels);Stand-pivot;BSC/3in1  Vision Patient Visual Report: No change from baseline       Perception     Praxis      Pertinent Vitals/Pain Pain Assessment Pain Assessment: Faces Faces Pain Scale: Hurts a little bit Pain Location: stomach Pain Descriptors / Indicators:  Aching, Tender Pain Intervention(s): Monitored during session     Hand Dominance Right   Extremity/Trunk Assessment Upper Extremity Assessment Upper Extremity Assessment: Overall WFL for tasks assessed   Lower Extremity Assessment Lower Extremity Assessment: Defer to PT evaluation   Cervical / Trunk Assessment Cervical / Trunk Assessment: Kyphotic   Communication Communication Communication: HOH   Cognition Arousal/Alertness: Awake/alert Behavior During Therapy: WFL for tasks assessed/performed Overall Cognitive Status: No family/caregiver present to determine baseline cognitive functioning                       Memory: Decreased short-term memory       Problem Solving: Slow processing, Decreased initiation, Requires verbal cues       General Comments   VSS on RA    Exercises     Shoulder Instructions      Home Living Family/patient expects to be discharged to:: Private residence Living Arrangements: Alone Available Help at Discharge: Family;Available PRN/intermittently Type of Home: House Home Access: Stairs to enter   Entrance Stairs-Rails: None Home Layout: Two level Alternate Level Stairs-Number of Steps: stair lift   Bathroom Shower/Tub: Teacher, early years/pre: Standard     Home Equipment: Conservation officer, nature (2 wheels);Cane - single point;BSC/3in1;Tub bench          Prior Functioning/Environment Prior Level of Function : Needs assist             Mobility Comments: pt ambulates short household distances with RW ADLs Comments: intermittent assistance with ADLs and IADLs from family        OT Problem List: Decreased activity tolerance;Impaired balance (sitting and/or standing);Decreased safety awareness      OT Treatment/Interventions: Self-care/ADL training;Therapeutic activities;DME and/or AE instruction;Patient/family education;Balance training;Energy conservation    OT Goals(Current goals can be found in the care  plan section) Acute Rehab OT Goals Patient Stated Goal: Go back home OT Goal Formulation: With patient Time For Goal Achievement: 12/02/2022 ADL Goals Pt Will Perform Grooming: with supervision;standing Pt Will Perform Lower Body Dressing: with min assist;sit to/from stand Pt Will Transfer to Toilet: with supervision;ambulating;regular height toilet  OT Frequency: Min 2X/week    Co-evaluation              AM-PAC OT "6 Clicks" Daily Activity     Outcome Measure Help from another person eating meals?: None Help from another person taking care of personal grooming?: A Little Help from another person toileting, which includes using toliet, bedpan, or urinal?: A Little Help from another person bathing (including washing, rinsing, drying)?: A Lot Help from another person to put on and taking off regular upper body clothing?: None Help from another person to put on and taking off regular lower body clothing?: A Lot 6 Click Score: 18   End of Session Equipment Utilized During Treatment: Rolling walker (2 wheels) Nurse Communication: Mobility status  Activity Tolerance: Patient tolerated treatment well Patient left: in chair;with call bell/phone within reach;with chair alarm set;with family/visitor present  OT Visit Diagnosis: Unsteadiness on feet (R26.81);Muscle weakness (generalized) (M62.81)                Time: 0960-4540 OT Time Calculation (min): 21 min Charges:  OT General  Charges $OT Visit: 1 Visit OT Evaluation $OT Eval Moderate Complexity: 1 Mod  11/23/2022  RP, OTR/L  Acute Rehabilitation Services  Office:  [phone]   Metta Clines 11/23/2022, 1:31 PM

## 2022-11-23 NOTE — Progress Notes (Signed)
Request received for possible RP bleed embolization.   Gloria [name] is a 89 y.o. female with PMH significant for HTN, nonobstructive CAD, left breast cancer, anxiety/depression who recently had ankle injury and subsequently developed DVT and was started on Eliquis, intermittent melena but she remained on Eliquis.  Patient developed urinary frequency, dehydration and worsening confusion, seen by PCP was told to increase hydration. Patient was brought to ED on 11/21/22 due to increasing confusion, labs notable for hgb 5.6 (2 unit of blood given,) FOBT +, INR 1.9 UTI, and BP as low as 90s/50s, HR to 135. CT CAP obtained in ED raised concern for left retroperitoneal hemorrhage, is also showed right hydronephrosis with 57m kidney stone.   Urology consulted and she underwent urgent cystoscopy with right ureteral stent placement.   GI was consulted who obtained MRCP to further evaluate the retroperitoneal fibrosis/ possible reorganizing RP bleed/ and to exclude choledocholithiasis or any pancreatic lesion.   Hem/onc also consulted who thought  anemia is likely related to her anticoagulation, Eliqis has been held but she will need need anticoagulation due to active bilateral LE DVT -  probably low dose Eliquis. Work up for IDA ordered by hem/onc.   She has been hemodynamically stable since the admission.   MR today showed:   1. Increase in retroperitoneal soft tissue now encasing vascular structures and narrowing venous structures in the retroperitoneum and affecting LEFT perinephric space greater than RIGHT. Findings are concerning for process such is IgG 4 related disease versus is infiltrative breast cancer metastasis. 2. Marked LEFT-sided perinephric stranding with persistent longstanding hydronephrosis related to above process. Superimposed pyelonephritis is considered in this patient with urosepsis now post RIGHT nephroureteral stenting.  GI concerned for bleeding RP mass, CTA AP and  IR eval  for possible RP bleed embo was also ordered.   Dr. WPascal Luxdiscussed with GI, per GI there is no concern for GIB.  Per Dr. WPascal Lux patient should not get IV contrast unless if it is needed to treat life threatening situation due to documented IV cont allergy with anaphylaxis.  CTA not recommended as she is hemodynamically stable and MR today did not show any RP bleed.   Traditionally RP bleed is not treated by angiogram and embolization as well.   Dr. WPascal Luxrecommended CT AP w/o contrast in the future for comparison, he does not recommend any angiogram as long as patient remains hemodynamically stable.  IR rad eval order will be discontinued, if there is concern for recurrent bleeding please order IR rad eval order and call IR radiologist to discuss.    [name] PA-C 11/23/2022 5:00 PM

## 2022-11-23 NOTE — Progress Notes (Signed)
PROGRESS NOTE  MANREET KIERNAN  DOB: June 24, 1928  PCP: Charlane Ferretti, MD LDJ:[id number]  DOA: 11/21/2022  LOS: 1 day  Hospital Day: 3  Brief narrative: Gloria [name] is a 89 y.o. female with PMH significant for HTN, nonobstructive CAD, left breast cancer, anxiety/depression who recently had ankle injury and subsequently developed DVT and was started on Eliquis.  This was complicated by intermittent melena but she remained on Eliquis. 1/23, patient's daughter took her to PCP with complaint of increased urinary frequency, dehydration and worsening confusion for 3 days.  She was sent home and suggested to increase hydration.  At home, her confusion worsened and EMS was called.  EMS noted her to have shortness of breath and wheezing all over.  En route, she received albuterol and Solu-Medrol.  In the ED, she had a fever of 102.9, heart rate in 130s, blood pressure 120s, required 2 L oxygen by nasal cannula She also complained of urinary urgency, frequency, dysuria and abdominal pain.  Labs showed elevated BNP at 129, troponin normal, WBC normal at 7.1, hemoglobin low at 5.6, platelet normal at 227, lactic acid elevated to 3.6, BUN/creatinine elevated to 28/1.41, INR 2.2 Respiratory virus panel negative for flu, RSV, COVID Chest x-ray unremarkable. FOBT positive Urinalysis showed yellow hazy color urine with moderate amount of blood, moderate leukocytes, positive nitrate and many bacteria  CT chest, abdomen pelvis showed 1. Diffuse inflammatory stranding surrounding the pancreas, duodenum, proximal small bowel, and descending colon. Findings are worrisome for acute pancreatitis. 2. Bladder wall thickening with surrounding inflammatory stranding worrisome for cystitis. 3. 2 mm calculus in the proximal right ureter with mild right-sided hydronephrosis. 4. Small amount of airspace disease in the lingula may represent atelectasis or infection. 6. New diffuse retroperitoneal stranding and left  pararenal stranding of uncertain etiology. Findings may be related to retroperitoneal hemorrhage, infectious/inflammatory process, or less likely retroperitoneal fibrosis.  Blood culture and urine culture were sent. Patient was started on broad-spectrum IV antibiotics. Patient was seen by urology. She underwent emergent cystoscopy, right ureteral stent placement. Postprocedure, she was also seen by PCCM.  She did not require transfer to ICU. Admitted to hospitalist service See below for details  MRI abdomen 1/24 showed 1. Increase in retroperitoneal soft tissue now encasing vascular structures and narrowing venous structures in the retroperitoneum and affecting LEFT perinephric space greater than RIGHT. Findings concerning for infiltrative breast cancer metastasis.   2. Marked LEFT-sided perinephric stranding with persistent longstanding hydronephrosis, persistent but improved right-sided hydronephrosis  3. Sludge in the gallbladder. No biliary duct dilation involving intrahepatic biliary tree. No choledocholithiasis. 4. While stranding is seen about the pancreas, but unlikely to be pancreatitis.     Subjective: Patient was seen and examined this morning.  Pleasant elderly Caucasian. Hard of hearing.  Propped up in bed. Son at bedside. Per family, she had excruciating pain last night with Foley catheter.  Assessment/Plan: Severe sepsis secondary to UTI - POA Obstructive right ureterolithiasis with mild hydronephrosis Klebsiella pneumonia and blood culture and urine culture Presented with dysuria, frequency, dehydration, worsening confusion Urinalysis showed yellow hazy color urine with moderate amount of blood, moderate leukocytes, positive nitrate and many bacteria CT scan showed 2 mm proximal right ureteral calculus with mild right-sided hydronephrosis, bladder wall thickening. S/p emergent right ureteral stent placement -1/23 Currently on IV aztreonam (Multiple allergies  listed) Blood culture and urine culture sent on admission grew Klebsiella pneumoniae. No recurrence of fever since stent placement.  WBC count normal.  Lactic acid  downtrending.   Urology removed Foley catheter today. Recent Labs  Lab 11/21/22 1758 11/21/22 1948 11/22/22 0041 11/22/22 0500 11/23/22 0305  WBC 7.1  --   --  6.3 9.2  LATICACIDVEN 3.6* 5.7* 2.3*  --   --   PROCALCITON  --   --   --  26.72 22.77   Possible progressive metastatic breast cancer Patient has history of blood pressure cancer and follows up with [name]. MRI of the last night showed increasing retroperitoneal soft tissue encasing vascular structures as well as affecting left perinephric space greater than right.  Findings concerning for infiltrative breast cancer metastasis. It also showed peripancreatic stranding less likely to be acute pancreatitis. I informed [name]. Continue to monitor.  Bilateral lower extremity DVT 1/24, ultrasound duplex of lower extremities showed acute DVT in the right gastrocnemius veins as well as left peroneal veins. However Eliquis cannot be used as of now because of significant anemia.  Acute blood loss anemia Presented with hemoglobin of 5.6. Per history, patient was recently started on Eliquis after DVT following an ankle injury.  She has noticed intermittent melena since then but continued Eliquis. FOBT positive in the ED CT scan also showed diffuse retroperitoneal stranding and left pararenal standing suspicious for retroperitoneal hemorrhage. Currently Eliquis on hold.  Received 2 units of PRBC transfusion on admission.  1 more unit ordered today.  Per oncology, target hemoglobin more than 7.5. GI was consulted for possible need of EGD. Recent Labs    09/30/22 0940 11/21/22 1758 11/22/22 0204 11/22/22 0500 11/23/22 0305  HGB 11.0* 5.6* 9.2* 9.1* 7.0*  MCV 98.5 90.6  --  91.5 89.8  FERRITIN  --   --   --   --  27  TIBC  --   --   --   --  298  IRON  --   --   --    --  18*  RETICCTPCT  --   --   --   --  3.0   Suspected aspiration pneumonia Acute respiratory failure with hypoxia EMS noted shortness of breath, wheezing given albuterol and Solu-Medrol. CT chest showed airspace disease in lingula. Suspect aspiration pneumonia related to confusion.  Will obtain speech eval. Dyspnea likely from combination of pneumonia and severe anemia. Currently on 2 L oxygen by nasal cannula. Continue to monitor.  Check ambulatory oxygen requirement Continue bronchodilators as needed  AKI Baseline creatinine less than 1.2.  Creatinine this morning was 1.47.  Related to sepsis. Creatinine improving.  1.39 today. Recent Labs    03/16/22 1402 04/13/22 1055 05/19/22 1252 06/22/22 1143 08/25/22 1157 09/30/22 0940 11/21/22 1758 11/22/22 0204 11/22/22 0500 11/23/22 0305  BUN 28* 26* 23 28* 26* 21 28* 23 25* 28*  CREATININE 1.21* 1.12* 1.16* 1.15* 1.25* 1.10* 1.41* 1.10* 1.47* 1.39*   Essential hypertension PTA on HCTZ 12.5 mg daily, losartan twice daily (50 mg a.m. and 25 mg p.m.) Currently BP meds on hold.  Recent ankle injury followed by DVT Eliquis on hold Pain control with Tylenol as needed PT eval ordered  Anxiety/depression PTA on Xanax, doxepin  Mobility: PT eval ordered  Goals of care   Code Status: Full Code.  I discussed this with her son at bedside today.  He said he will discuss with other family members.  He believes that she might have DNR in the past.  But he is not able to make any decision at this time.   DVT prophylaxis: Positive for bilateral lower  EXTR DVT but currently unable to use blood thinners because of acute bleeding and anemia    Antimicrobials: IV aztreonam Fluid: Stop IV fluid today Consultants: GI, PCCM, urology, oncology Family Communication: Son at bedside  Status is: Inpatient Level of care: Progressive   Dispo: Patient is from: Home            Anticipated d/c is to: Pending clinical course Continue  in-hospital care because: Not medically stable for discharge.  Continues to have anemia.  Possibility of worsening metastasis.   Scheduled Meds:  sodium chloride   Intravenous Once   budesonide  0.25 mg Nebulization BID   doxepin  10 mg Oral QHS   [START ON 11/27/2022] ferrous sulfate  325 mg Oral Q breakfast   fluticasone  1 spray Each Nare BID   pantoprazole (PROTONIX) IV  40 mg Intravenous Q12H    PRN meds: acetaminophen **OR** acetaminophen, pseudoephedrine **AND** acetaminophen, albuterol, ALPRAZolam, guaiFENesin, hydrALAZINE, morphine injection, zolpidem   Infusions:   aztreonam 1 g (11/23/22 0910)   ferric gluconate (FERRLECIT) IVPB 250 mg (11/23/22 1145)    Diet:  Diet Order             Diet clear liquid Room service appropriate? Yes; Fluid consistency: Thin  Diet effective now                   Antimicrobials: Anti-infectives (From admission, onward)    Start     Dose/Rate Route Frequency Ordered Stop   11/23/22 1800  vancomycin (VANCOREADY) IVPB 750 mg/150 mL  Status:  Discontinued        750 mg 150 mL/hr over 60 Minutes Intravenous Every 48 hours 11/21/22 2038 11/22/22 1246   11/22/22 0200  aztreonam (AZACTAM) 1 g in sodium chloride 0.9 % 100 mL IVPB        1 g 200 mL/hr over 30 Minutes Intravenous Every 8 hours 11/21/22 2038     11/21/22 1800  aztreonam (AZACTAM) 2 g in sodium chloride 0.9 % 100 mL IVPB        2 g 200 mL/hr over 30 Minutes Intravenous  Once 11/21/22 1755 11/21/22 1847   11/21/22 1800  metroNIDAZOLE (FLAGYL) IVPB 500 mg        500 mg 100 mL/hr over 60 Minutes Intravenous  Once 11/21/22 1755 11/21/22 1932   11/21/22 1800  vancomycin (VANCOCIN) IVPB 1000 mg/200 mL premix        1,000 mg 200 mL/hr over 60 Minutes Intravenous  Once 11/21/22 1755 11/21/22 1932       Skin assessment:       Nutritional status:  Body mass index is 24.8 kg/m.          Objective: Vitals:   11/23/22 0849 11/23/22 1128  BP: (!) 140/64 105/65   Pulse: 74 71  Resp: 19 19  Temp: 97.7 F (36.5 C) 98.4 F (36.9 C)  SpO2: 93% 92%    Intake/Output Summary (Last 24 hours) at 11/23/2022 1346 Last data filed at 11/23/2022 0800 Gross per 24 hour  Intake 120 ml  Output 250 ml  Net -130 ml   Filed Weights   11/21/22 1743  Weight: 57.6 kg   Weight change:  Body mass index is 24.8 kg/m.   Physical Exam: General exam: Pleasant, elderly Caucasian female.  Not in distress. Skin: No rashes, lesions or ulcers. HEENT: Atraumatic, normocephalic, no obvious bleeding Lungs: Diminished air entry in both bases, otherwise clear to auscultation bilaterally CVS: Regular rate and rhythm,  no murmur GI/Abd soft, nondistended, mild tenderness present in epigastrium, bowel sound present, CNS: Alert, awake, oriented x 3.  Slow to respond.  Extremely hard of hearing Psychiatry: Sad affect Extremities: Mild edema both legs.  Data Review: I have personally reviewed the laboratory data and studies available.  F/u labs  Unresulted Labs (From admission, onward)     Start     Ordered   11/23/22 0500  Procalcitonin  Daily,   R      11/22/22 0832   11/23/22 0500  Haptoglobin  Tomorrow morning,   R        11/22/22 1745   11/21/22 2040  MRSA Next Gen by PCR, Nasal  (MRSA Screening)  Once,   URGENT        11/21/22 2039            Total time spent in review of labs and imaging, patient evaluation, formulation of plan, documentation and communication with family: 31 minutes  Signed, Terrilee Croak, MD [facility] 11/23/2022

## 2022-11-24 ENCOUNTER — Encounter (HOSPITAL_COMMUNITY): Payer: Self-pay | Admitting: Radiology

## 2022-11-24 ENCOUNTER — Inpatient Hospital Stay: Payer: PPO

## 2022-11-24 ENCOUNTER — Telehealth: Payer: Self-pay | Admitting: Cardiovascular Disease

## 2022-11-24 ENCOUNTER — Inpatient Hospital Stay: Payer: PPO | Admitting: Hematology

## 2022-11-24 ENCOUNTER — Inpatient Hospital Stay (HOSPITAL_COMMUNITY): Payer: PPO

## 2022-11-24 DIAGNOSIS — Z515 Encounter for palliative care: Secondary | ICD-10-CM

## 2022-11-24 DIAGNOSIS — Z66 Do not resuscitate: Secondary | ICD-10-CM | POA: Diagnosis not present

## 2022-11-24 DIAGNOSIS — N3001 Acute cystitis with hematuria: Secondary | ICD-10-CM | POA: Diagnosis not present

## 2022-11-24 DIAGNOSIS — D62 Acute posthemorrhagic anemia: Secondary | ICD-10-CM | POA: Diagnosis not present

## 2022-11-24 DIAGNOSIS — Z7189 Other specified counseling: Secondary | ICD-10-CM

## 2022-11-24 DIAGNOSIS — C50919 Malignant neoplasm of unspecified site of unspecified female breast: Secondary | ICD-10-CM

## 2022-11-24 DIAGNOSIS — R58 Hemorrhage, not elsewhere classified: Secondary | ICD-10-CM | POA: Diagnosis not present

## 2022-11-24 DIAGNOSIS — D649 Anemia, unspecified: Secondary | ICD-10-CM | POA: Diagnosis not present

## 2022-11-24 LAB — TYPE AND SCREEN
ABO/RH(D): O NEG
Antibody Screen: NEGATIVE
Unit division: 0
Unit division: 0
Unit division: 0

## 2022-11-24 LAB — CULTURE, BLOOD (ROUTINE X 2): Special Requests: ADEQUATE

## 2022-11-24 LAB — BPAM RBC
Blood Product Expiration Date: 202401282359
Blood Product Expiration Date: 202401292359
Blood Product Expiration Date: 202402042359
ISSUE DATE / TIME: 202401232117
ISSUE DATE / TIME: 202401240018
ISSUE DATE / TIME: 202401251442
Unit Type and Rh: 9500
Unit Type and Rh: 9500
Unit Type and Rh: 9500

## 2022-11-24 LAB — CBC
HCT: 29.8 % — ABNORMAL LOW (ref 36.0–46.0)
Hemoglobin: 9.5 g/dL — ABNORMAL LOW (ref 12.0–15.0)
MCH: 29.3 pg (ref 26.0–34.0)
MCHC: 31.9 g/dL (ref 30.0–36.0)
MCV: 92 fL (ref 80.0–100.0)
Platelets: 161 10*3/uL (ref 150–400)
RBC: 3.24 MIL/uL — ABNORMAL LOW (ref 3.87–5.11)
RDW: 17 % — ABNORMAL HIGH (ref 11.5–15.5)
WBC: 6.2 10*3/uL (ref 4.0–10.5)
nRBC: 0 % (ref 0.0–0.2)

## 2022-11-24 LAB — URINE CULTURE: Culture: 900 — AB

## 2022-11-24 LAB — BASIC METABOLIC PANEL
Anion gap: 9 (ref 5–15)
BUN: 31 mg/dL — ABNORMAL HIGH (ref 8–23)
CO2: 18 mmol/L — ABNORMAL LOW (ref 22–32)
Calcium: 8.4 mg/dL — ABNORMAL LOW (ref 8.9–10.3)
Chloride: 108 mmol/L (ref 98–111)
Creatinine, Ser: 1.29 mg/dL — ABNORMAL HIGH (ref 0.44–1.00)
GFR, Estimated: 39 mL/min — ABNORMAL LOW (ref >60)
Glucose, Bld: 85 mg/dL (ref 70–99)
Potassium: 4 mmol/L (ref 3.5–5.1)
Sodium: 135 mmol/L (ref 135–145)

## 2022-11-24 LAB — PROCALCITONIN: Procalcitonin: 17.83 ng/mL

## 2022-11-24 MED ORDER — DIPHENHYDRAMINE HCL 50 MG/ML IJ SOLN
25.0000 mg | Freq: Once | INTRAMUSCULAR | Status: DC | PRN
  Filled 2022-11-24: qty 1

## 2022-11-24 MED ORDER — EPINEPHRINE 0.3 MG/0.3ML IJ SOAJ
0.3000 mg | Freq: Once | INTRAMUSCULAR | Status: DC | PRN
  Filled 2022-11-24: qty 0.6

## 2022-11-24 MED ORDER — AMOXICILLIN 500 MG PO CAPS
500.0000 mg | ORAL_CAPSULE | Freq: Once | ORAL | Status: AC
  Administered 2022-11-24: 500 mg via ORAL
  Filled 2022-11-24: qty 1

## 2022-11-24 NOTE — Telephone Encounter (Addendum)
Daughter stated she wanted Dr. Claiborne Billings to know that patient is in the hospital with sepsis. She has internal bleeding and received 4 units blood. She is on pressors. Eliquis discontinued. At 3:36, advised by patient's nurse Judye Bos, RN that patient is not on pressors.

## 2022-11-24 NOTE — Progress Notes (Signed)
IR procedure request IVC filter placement  89 y.o. female inpatient. Presented to the ED with AMS. Found to have a left retroperitoenal hemorrhage and right hydro. Urology placed a double J. Venous Doppler shows acute  right gastrocnemius veins on the right and chronic left peroneal vein dvt.   After review of procedure request by IR Attending Dr. Henreitta Leber.  Due to the Patient's current IVC stenosis low risk for clinically relevant PE from LE DVT. IVC filter placement is not indicated at this time. This was communicated directly to the Team.   Please call IR with questions/concerns.

## 2022-11-24 NOTE — Progress Notes (Signed)
Pt and patient's son refused exam at this time.

## 2022-11-24 NOTE — Progress Notes (Signed)
PROGRESS NOTE  Gloria [name]  DOB: 1928/04/13  PCP: Charlane Ferretti, MD IOX:[id number]  DOA: 11/21/2022  LOS: 2 days  Hospital Day: 4  Brief narrative: Gloria [name] is a 89 y.o. female with PMH significant for HTN, nonobstructive CAD, left breast cancer, anxiety/depression who recently had ankle injury and subsequently developed DVT and was started on Eliquis.  This was complicated by intermittent melena but she remained on Eliquis. 1/23, patient's daughter took her to PCP with complaint of increased urinary frequency, dehydration and worsening confusion for 3 days.  She was sent home and suggested to increase hydration.  At home, her confusion worsened and EMS was called.  EMS noted her to have shortness of breath and wheezing all over.  En route, she received albuterol and Solu-Medrol.  In the ED, she had a fever of 102.9, heart rate in 130s, blood pressure 120s, required 2 L oxygen by nasal cannula She also complained of urinary urgency, frequency, dysuria and abdominal pain.  Labs showed elevated BNP at 129, troponin normal, WBC normal at 7.1, hemoglobin low at 5.6, platelet normal at 227, lactic acid elevated to 3.6, BUN/creatinine elevated to 28/1.41, INR 2.2 Respiratory virus panel negative for flu, RSV, COVID Chest x-ray unremarkable. FOBT positive Urinalysis showed yellow hazy color urine with moderate amount of blood, moderate leukocytes, positive nitrate and many bacteria  CT chest, abdomen pelvis showed 1. Diffuse inflammatory stranding surrounding the pancreas, duodenum, proximal small bowel, and descending colon. Findings are worrisome for acute pancreatitis. 2. Bladder wall thickening with surrounding inflammatory stranding worrisome for cystitis. 3. 2 mm calculus in the proximal right ureter with mild right-sided hydronephrosis. 4. Small amount of airspace disease in the lingula may represent atelectasis or infection. 6. New diffuse retroperitoneal stranding and left  pararenal stranding of uncertain etiology. Findings may be related to retroperitoneal hemorrhage, infectious/inflammatory process, or less likely retroperitoneal fibrosis.  Blood culture and urine culture were sent. Patient was started on broad-spectrum IV antibiotics. Patient was seen by urology. She underwent emergent cystoscopy, right ureteral stent placement. Postprocedure, she was also seen by PCCM.  She did not require transfer to ICU. Admitted to hospitalist service See below for details  MRI abdomen 1/24 showed 1. Increase in retroperitoneal soft tissue now encasing vascular structures and narrowing venous structures in the retroperitoneum and affecting LEFT perinephric space greater than RIGHT. Findings concerning for infiltrative breast cancer metastasis.   2. Marked LEFT-sided perinephric stranding with persistent longstanding hydronephrosis, persistent but improved right-sided hydronephrosis  3. Sludge in the gallbladder. No biliary duct dilation involving intrahepatic biliary tree. No choledocholithiasis. 4. While stranding is seen about the pancreas, but unlikely to be pancreatitis.     Subjective: Patient was seen and examined this morning.  Pleasant elderly Caucasian. Hard of hearing.  Propped up in bed. Son at bedside. Pain improving after Foley catheter was removed yesterday.  Assessment/Plan: Severe sepsis secondary to UTI - POA Obstructive right ureterolithiasis with mild hydronephrosis Klebsiella pneumonia and blood culture and urine culture Presented with dysuria, frequency, dehydration, worsening confusion Urinalysis showed yellow hazy color urine with moderate amount of blood, moderate leukocytes, positive nitrate and many bacteria CT scan showed 2 mm proximal right ureteral calculus with mild right-sided hydronephrosis, bladder wall thickening. S/p emergent right ureteral stent placement -1/23 Currently on IV aztreonam (Multiple allergies listed). Blood  culture and urine culture sent on admission grew Klebsiella pneumoniae.  Discussed with pharmacist for oral antibiotic choice. No recurrence of fever since stent placement.  WBC  count normal.  Lactic acid downtrending.   Urology removed Foley catheter on 1/25.Marland Kitchen Recent Labs  Lab 11/21/22 1758 11/21/22 1948 11/22/22 0041 11/22/22 0500 11/23/22 0305 11/24/22 0512 11/24/22 0835  WBC 7.1  --   --  6.3 9.2  --  6.2  LATICACIDVEN 3.6* 5.7* 2.3*  --   --   --   --   PROCALCITON  --   --   --  26.72 22.77 17.83  --     Possible progressive metastatic breast cancer Patient has history of blood pressure cancer and follows up with [name]. MRI as above showed increasing retroperitoneal soft tissue encasing vascular structures as well as affecting left perinephric space greater than right.  Findings concerning for infiltrative breast cancer metastasis. It also showed peripancreatic stranding less likely to be acute pancreatitis. Per oncology, patient is not a candidate for any chemo or radiation. Continue to monitor.  Bilateral lower extremity DVT 1/24, ultrasound duplex of lower extremities showed acute DVT in the right gastrocnemius veins as well as left peroneal veins. However Eliquis cannot be used as of now because of significant anemia. Discussed with IR about IVC filter.  Patient is a poor candidate for it.  Acute blood loss anemia Presented with hemoglobin of 5.6. Per history, patient was recently started on Eliquis after DVT following an ankle injury.  She has noticed intermittent melena since then but continued Eliquis. FOBT positive in the ED CT scan also showed diffuse retroperitoneal stranding and left pararenal standing suspicious for retroperitoneal hemorrhage. Currently Eliquis on hold.  Received 3 units of PRBC so far.  Hemoglobin 9.5 today.  GI following. Continue to monitor.  Target hemoglobin more than 8. Recent Labs    11/21/22 1758 11/22/22 0204 11/22/22 0500  11/23/22 0305 11/24/22 0835  HGB 5.6* 9.2* 9.1* 7.0* 9.5*  MCV 90.6  --  91.5 89.8 92.0  FERRITIN  --   --   --  27  --   TIBC  --   --   --  298  --   IRON  --   --   --  18*  --   RETICCTPCT  --   --   --  3.0  --     Suspected aspiration pneumonia Acute respiratory failure with hypoxia EMS noted shortness of breath, wheezing given albuterol and Solu-Medrol. CT chest showed airspace disease in lingula. Suspect aspiration pneumonia related to confusion.  Will obtain speech eval. Dyspnea likely from combination of pneumonia and severe anemia. Currently on 2 L oxygen by nasal cannula. Continue to monitor.  Check ambulatory oxygen requirement Continue bronchodilators as needed  AKI Baseline creatinine less than 1.2.  Creatinine this morning was 1.47.  Related to sepsis. Creatinine improving.  1.29 today. Recent Labs    04/13/22 1055 05/19/22 1252 06/22/22 1143 08/25/22 1157 09/30/22 0940 11/21/22 1758 11/22/22 0204 11/22/22 0500 11/23/22 0305 11/24/22 0835  BUN 26* 23 28* 26* 21 28* 23 25* 28* 31*  CREATININE 1.12* 1.16* 1.15* 1.25* 1.10* 1.41* 1.10* 1.47* 1.39* 1.29*    Essential hypertension PTA on HCTZ 12.5 mg daily, losartan twice daily (50 mg a.m. and 25 mg p.m.) Currently BP meds on hold.  Recent ankle injury followed by DVT Eliquis on hold Pain control with Tylenol as needed PT eval ordered  Anxiety/depression PTA on Xanax, doxepin  Mobility: PT eval ordered.  Home with PT recommended it seems  Goals of care   Code Status: Full Code.  I discussed this with  her son at bedside.  He said he will discuss with other family members.  He believes that she might have DNR in the past.  But he is not able to make any decision at this time. Palliative care consulted.   DVT prophylaxis: Positive for bilateral lower EXTR DVT but currently unable to use blood thinners because of acute bleeding and anemia    Antimicrobials: IV aztreonam Fluid: Not on IV  fluid Consultants: GI, PCCM, urology, oncology, Palliative care Family Communication: Son at bedside  Status is: Inpatient Level of care: Progressive   Dispo: Patient is from: Home            Anticipated d/c is to: Pending clinical course Continue in-hospital care because: Not medically stable for discharge.  Continues to have anemia.  Remains on IV antibiotics  Scheduled Meds:  budesonide  0.25 mg Nebulization BID   doxepin  10 mg Oral QHS   [START ON 11/27/2022] ferrous sulfate  325 mg Oral Q breakfast   fluticasone  1 spray Each Nare BID   pantoprazole (PROTONIX) IV  40 mg Intravenous Q12H    PRN meds: acetaminophen **OR** acetaminophen, pseudoephedrine **AND** acetaminophen, ALPRAZolam, guaiFENesin, hydrALAZINE, levalbuterol, morphine injection, zolpidem   Infusions:   aztreonam Stopped (11/24/22 1020)   ferric gluconate (FERRLECIT) IVPB 135 mL/hr at 11/24/22 1300    Diet:  Diet Order             Diet clear liquid Room service appropriate? Yes; Fluid consistency: Thin  Diet effective now                   Antimicrobials: Anti-infectives (From admission, onward)    Start     Dose/Rate Route Frequency Ordered Stop   11/23/22 1800  vancomycin (VANCOREADY) IVPB 750 mg/150 mL  Status:  Discontinued        750 mg 150 mL/hr over 60 Minutes Intravenous Every 48 hours 11/21/22 2038 11/22/22 1246   11/22/22 0200  aztreonam (AZACTAM) 1 g in sodium chloride 0.9 % 100 mL IVPB        1 g 200 mL/hr over 30 Minutes Intravenous Every 8 hours 11/21/22 2038     11/21/22 1800  aztreonam (AZACTAM) 2 g in sodium chloride 0.9 % 100 mL IVPB        2 g 200 mL/hr over 30 Minutes Intravenous  Once 11/21/22 1755 11/21/22 1847   11/21/22 1800  metroNIDAZOLE (FLAGYL) IVPB 500 mg        500 mg 100 mL/hr over 60 Minutes Intravenous  Once 11/21/22 1755 11/21/22 1932   11/21/22 1800  vancomycin (VANCOCIN) IVPB 1000 mg/200 mL premix        1,000 mg 200 mL/hr over 60 Minutes Intravenous   Once 11/21/22 1755 11/21/22 1932       Skin assessment:       Nutritional status:  Body mass index is 24.8 kg/m.          Objective: Vitals:   11/24/22 1200 11/24/22 1305  BP:  (!) 152/71  Pulse: 72 69  Resp: 16 (!) 22  Temp:  97.8 F (36.6 C)  SpO2: 92% 97%    Intake/Output Summary (Last 24 hours) at 11/24/2022 1346 Last data filed at 11/24/2022 1300 Gross per 24 hour  Intake 917.31 ml  Output 700 ml  Net 217.31 ml    Filed Weights   11/21/22 1743  Weight: 57.6 kg   Weight change:  Body mass index is 24.8 kg/m.  Physical Exam: General exam: Pleasant, elderly Caucasian female.  Not in distress. Skin: No rashes, lesions or ulcers. HEENT: Atraumatic, normocephalic, no obvious bleeding Lungs: Diminished air entry in both bases, otherwise clear to auscultation bilaterally CVS: Regular rate and rhythm, no murmur GI/Abd soft, nondistended, mild tenderness present in epigastrium, bowel sound present, CNS: Alert, awake, oriented x 3.  Slow to respond.  Extremely hard of hearing Psychiatry: Sad affect Extremities: Mild edema both legs.  Data Review: I have personally reviewed the laboratory data and studies available.  F/u labs  Unresulted Labs (From admission, onward)     Start     Ordered   11/25/22 0500  CBC with Differential/Platelet  Daily,   R     Question:  Specimen collection method  Answer:  Lab=Lab collect   11/24/22 0819   11/25/22 2505  Basic metabolic panel  Daily,   R     Question:  Specimen collection method  Answer:  Lab=Lab collect   11/24/22 0819   11/23/22 0500  Haptoglobin  Tomorrow morning,   R        11/22/22 1745   11/21/22 2040  MRSA Next Gen by PCR, Nasal  (MRSA Screening)  Once,   URGENT        11/21/22 2039            Total time spent in review of labs and imaging, patient evaluation, formulation of plan, documentation and communication with family: 16 minutes  Signed, Terrilee Croak, MD [facility] 11/24/2022

## 2022-11-24 NOTE — Progress Notes (Signed)
[facility] ROUNDING NOTE   Subjective: C/o low back and abd pain Hgb improved to 9.5 s/p prbc   Objective: Vital signs in last 24 hours: Temp:  [97 F (36.1 C)-98.5 F (36.9 C)] 97.8 F (36.6 C) (01/26 1305) Pulse Rate:  [67-76] 69 (01/26 1305) Resp:  [15-22] 22 (01/26 1305) BP: (124-152)/(66-88) 152/71 (01/26 1305) SpO2:  [92 %-97 %] 97 % (01/26 1305) Last BM Date :  (PTA) General: NAD   Intake/Output from previous day: 01/25 0701 - 01/26 0700 In: 730 [P.O.:360; IV Piggyback:370] Out: 200 [Urine:200] Intake/Output this shift: Total I/O In: 307.3 [IV Piggyback:307.3] Out: 500 [Urine:500]   Lab Results: Recent Labs    11/22/22 0500 11/23/22 0305 11/24/22 0835  WBC 6.3 9.2 6.2  HGB 9.1* 7.0* 9.5*  PLT 151 152 161  MCV 91.5 89.8 92.0   BMET Recent Labs    11/22/22 0500 11/23/22 0305 11/24/22 0835  NA 136 136 135  K 3.8 3.3* 4.0  CL 104 111 108  CO2 20* 18* 18*  GLUCOSE 184* 104* 85  BUN 25* 28* 31*  CREATININE 1.47* 1.39* 1.29*  CALCIUM 8.2* 7.0* 8.4*   LFT Recent Labs    11/21/22 1758 11/22/22 0500 11/23/22 0305  PROT 5.9* 5.5* 4.6*  ALBUMIN 3.3* 2.8* 2.4*  AST 31 45* 29  ALT '12 18 18  '$ ALKPHOS 74 71 59  BILITOT 0.8 1.3* 0.6   PT/INR Recent Labs    11/21/22 1758 11/22/22 0500  INR 2.2* 1.9*      Imaging/Other results: MR ABDOMEN MRCP W WO CONTAST  Result Date: 11/23/2022 CLINICAL DATA:  Acute pancreatitis in a 89 year old female. EXAM: MRI ABDOMEN WITHOUT AND WITH CONTRAST (INCLUDING MRCP) TECHNIQUE: Multiplanar multisequence MR imaging of the abdomen was performed both before and after the administration of intravenous contrast. Heavily T2-weighted images of the biliary and pancreatic ducts were obtained, and three-dimensional MRCP images were rendered by post processing. CONTRAST:  7.32m GADAVIST GADOBUTROL 1 MMOL/ML IV SOLN COMPARISON:  November 21, 2021 CT of the chest, and ultrasound evaluation which was performed on  November 22, 2021 FINDINGS: Lower chest: Small LEFT and trace RIGHT effusions.  Cardiomegaly. Hepatobiliary: Sludge in the gallbladder. No biliary duct dilation. Smooth segmental attenuation of the common bile duct in the pancreas but without biliary duct dilation involving intrahepatic biliary tree. No choledocholithiasis. Study limited by motion related artifact in this debilitated patient. No focal, suspicious hepatic lesion. Portal vein is patent. Hepatic arterial supply derive from celiac axis in a classic fashion. Pancreas: Pancreas with maintained intrinsic T1 signal and without signs of pancreatic atrophy or ductal dilation. No focal pancreatic lesion. Spleen: Normal size with lobular contour which is unchanged compared to previous imaging. Adrenals/Urinary Tract: LEFT adrenal gland is obscured by diffuse soft tissue and stranding in the LEFT retroperitoneum. This stranding and soft tissue encases the aorta, celiac and SMA and tracks to the RIGHT of the midline also involving the RIGHT adrenal gland. Small amounts of blood in the collecting systems of the RIGHT kidney post stent placement. Urothelial enhancement along the course of the RIGHT ureter. No discrete suspicious renal lesion. Mild LEFT hydronephrosis similar to prior imaging. RIGHT hydronephrosis persists but is improved following stent placement. Course of the RIGHT ureter appears medially deviated towards the IVC which is diminutive. Stomach/Bowel: No acute gastrointestinal process to the extent evaluated on this abdominal MRI. Vascular/Lymphatic: Narrowing of the IVC due to soft tissue in the retroperitoneum. Encased retroperitoneal and mesenteric vasculature. LEFT retroperitoneal soft  tissue seen on previous PET imaging now contiguous with this diffuse soft tissue that surrounds vasculature in the retroperitoneum. No discrete adenopathy. When compared to prior PET imaging there is much more diffuse soft tissue and the IVC is clearly narrowed  compared to imaging from August. There is also encasement of the LEFT renal vein. Renal enhancement remain symmetric. Large cyst arises from the upper pole the LEFT kidney not substantially changed more remote imaging. Other: Small volume ascites in the LEFT upper quadrant at about the spleen. Diffuse body wall edema. Noted on coronal images diffuse enhancement with near nodular appearance of the renal fascia on the LEFT is noted, this process largely spares the RIGHT kidney and perinephric space affecting the RIGHT adrenal to some extent as outlined above. Musculoskeletal: No suspicious bone lesions identified. There is in infiltrative appearance of nodular soft tissue along the anterior margin of the spine and LEFT psoas, quadratus lumborum and LEFT hemidiaphragm. IMPRESSION: 1. Increase in retroperitoneal soft tissue now encasing vascular structures and narrowing venous structures in the retroperitoneum and affecting LEFT perinephric space greater than RIGHT. Findings are concerning for process such is IgG 4 related disease versus is infiltrative breast cancer metastasis. 2. Marked LEFT-sided perinephric stranding with persistent longstanding hydronephrosis related to above process. Superimposed pyelonephritis is considered in this patient with urosepsis now post RIGHT nephroureteral stenting. 3. Small volume ascites in the LEFT upper quadrant at about the spleen. Difficult to exclude the possibility of small amounts of blood products within this fluid given density on recent chest abdomen and pelvis CT though some of this density could be attributed to presence of soft tissue which is shown to enhance in these areas. 4. Small LEFT and trace RIGHT effusions. 5. Diffuse body wall edema. 6. Sludge in the gallbladder. No biliary duct dilation involving intrahepatic biliary tree. No choledocholithiasis. There is some mild irregularity with respect to biliary narrowing, a finding that could also be seen in the setting  of IgG 4 related disease. 7. While stranding is seen about the pancreas pancreas shows remarkably normal appearing intrinsic T1 signal. Pancreatitis is unlikely but continued correlation with pancreatic enzymes may be helpful to allow for exclusion of this possibility. 8. Given presence of bilateral effusions and generalized edema would also continued to assess for changes in clinical markers of heart failure. Narrowing of the IVC could lead to diffuse edema as well 9. Persistent but improved RIGHT-sided hydronephrosis. These results will be called to the ordering clinician or representative by the Radiologist Assistant, and communication documented in the PACS or [employer]. Electronically Signed   By: Zetta Bills M.D.   On: 11/23/2022 08:26   VAS [place] LOWER EXTREMITY VENOUS (DVT)  Result Date: 11/22/2022  Lower Venous DVT Study Patient Name:  Gloria [name]  Date of Exam:   11/22/2022 Medical Rec #: [id number]      Accession #:    [id number] Date of Birth: 05-14-1928      Patient Gender: F Patient Age:   59 years Exam Location:  [facility] Procedure:      VAS [place] LOWER EXTREMITY VENOUS (DVT) Referring Phys: Terrilee Croak --------------------------------------------------------------------------------  Indications: Swelling, Eliquis discontinued secondary to bleeding.  Risk Factors: None identified. Limitations: Patient pain/guarding. Comparison Study: 09-27-2022 Right lower extremity venous study was positive for                   DVT. Performing Technologist: Darlin Coco RDMS, RVT  Examination Guidelines: A complete evaluation includes B-mode  imaging, spectral Doppler, color Doppler, and power Doppler as needed of all accessible portions of each vessel. Bilateral testing is considered an integral part of a complete examination. Limited examinations for reoccurring indications may be performed as noted. The reflux portion of the exam is performed with the patient in reverse Trendelenburg.   +---------+---------------+---------+-----------+----------+--------------+ RIGHT    CompressibilityPhasicitySpontaneityPropertiesThrombus Aging +---------+---------------+---------+-----------+----------+--------------+ CFV      Full           Yes      Yes                                 +---------+---------------+---------+-----------+----------+--------------+ SFJ      Full                                                        +---------+---------------+---------+-----------+----------+--------------+ FV Prox  Full                                                        +---------+---------------+---------+-----------+----------+--------------+ FV Mid   Full                                                        +---------+---------------+---------+-----------+----------+--------------+ FV DistalFull                                                        +---------+---------------+---------+-----------+----------+--------------+ PFV      Full                                                        +---------+---------------+---------+-----------+----------+--------------+ POP      Full           Yes      Yes                                 +---------+---------------+---------+-----------+----------+--------------+ PTV      Full                                                        +---------+---------------+---------+-----------+----------+--------------+ PERO     Full                                                        +---------+---------------+---------+-----------+----------+--------------+  Gastroc  None           No       No                   Acute          +---------+---------------+---------+-----------+----------+--------------+   +---------+---------------+---------+-----------+----------+-----------------+ LEFT     CompressibilityPhasicitySpontaneityPropertiesThrombus Aging     +---------+---------------+---------+-----------+----------+-----------------+ CFV      Full           Yes      Yes                                    +---------+---------------+---------+-----------+----------+-----------------+ SFJ      Full                                                           +---------+---------------+---------+-----------+----------+-----------------+ FV Prox  Full                                                           +---------+---------------+---------+-----------+----------+-----------------+ FV Mid   Full                                                           +---------+---------------+---------+-----------+----------+-----------------+ FV DistalFull                                                           +---------+---------------+---------+-----------+----------+-----------------+ PFV      Full                                                           +---------+---------------+---------+-----------+----------+-----------------+ POP      Full           Yes      Yes                                    +---------+---------------+---------+-----------+----------+-----------------+ PTV      Full                                                           +---------+---------------+---------+-----------+----------+-----------------+ PERO     Partial        Yes      Yes  Age Indeterminate +---------+---------------+---------+-----------+----------+-----------------+ Gastroc  Full                                                           +---------+---------------+---------+-----------+----------+-----------------+     Summary: RIGHT: - Findings consistent with acute deep vein thrombosis involving the right gastrocnemius veins. - No cystic structure found in the popliteal fossa.  LEFT: - Findings consistent with age indeterminate deep vein thrombosis involving the left peroneal veins. - No cystic  structure found in the popliteal fossa.  *See table(s) above for measurements and observations. Electronically signed by Harold Barban MD on 11/22/2022 at 8:57:23 PM.    Final       Assessment &Plan  89 yr old very pleasant female with metastatic breast ca, recent b/l DVT on eliquis presented with worsening anemia No melena or hematochezia Retroperitoneal soft tissue concerning for RP bleed in the setting met from breast ca Has h/o anaphylaxis to contrast, cannot undergo CTA and not candidate for IR intervention  Anticoagulation on hold per oncology as bleeding risk outweighs benefit  Conservative management Palliative care consult  GI will sign off, available if have any questions    The patient and family at bedside were provided an opportunity to ask questions and all were answered. They agreed with the plan and demonstrated an understanding of the instructions.    Gloria [name] , MD [phone]  [facility]

## 2022-11-24 NOTE — Consult Note (Addendum)
Palliative Medicine Inpatient Consult Note  Consulting Provider: Dr. Pietro [name]  Reason for consult:   [facility] Consult  Reason for Consult? [place]   11/24/2022  HPI:  Per intake H&P --> Gloria [name] is a 89 y.o. female with PMH significant for HTN, nonobstructive CAD, left breast cancer, anxiety/depression who recently had ankle injury and subsequently developed DVT and was started on Eliquis.   Palliative care has been asked to get involved in the setting of concern for RP bleed in the setting met from breast ca. Plan to further address goals of care.   Clinical Assessment/Goals of Care:  *Please note that this is a verbal dictation therefore any spelling or grammatical errors are due to the "[place] One" system interpretation.  I have reviewed medical records including EPIC notes, labs and imaging, received report from bedside RN, assessed the patient who is lying in bed in NAD.    I met with Gloria [name] to further discuss diagnosis prognosis, GOC, EOL wishes, disposition and options.   I introduced Palliative Medicine as specialized medical care for people living with serious illness. It focuses on providing relief from the symptoms and stress of a serious illness. The goal is to improve quality of life for both the patient and the family.  Medical History Review and Understanding:  I reviewed with Gloria [name] her history of hypertension, coronary artery disease, left breast cancer with possible recurrence, anxiety and depression, and DVT.  Social History:  Gloria [name] shares with me that she is from [facility] where she is lived throughout the duration of her life.  She is a widow and has 2 children and 4 grandchildren.  Gloria [name] used to work for a [employer].  She is someone who gets a lot of joy out of living and tends to go out to eat with friends as well as family.  She is a woman of faith and practices within  the [facility] denomination.  Functional and Nutritional State:  Prior to hospitalization Gloria [name] was able to mobilize with a front wheel walker, dress herself, feed herself, get up and down her stairs with a chairlift, needed some assistance in the bathroom and had a bath board.  Patient's daughter spends the night with her and granddaughter spends the day with her. Patient had a good appetite preceding admission.  Advance Directives:  A detailed discussion was had today regarding advanced directives.  Patient's son and daughter are her surrogate decision makers.  Code Status:  Concepts specific to code status, artifical feeding and hydration, continued IV antibiotics and rehospitalization was had.  The difference between a aggressive medical intervention path  and a palliative comfort care path for this patient at this time was had.   Encouraged patient/family to consider DNR/DNI status understanding evidenced based poor outcomes in similar hospitalized patient, as the cause of arrest is likely associated with advanced chronic/terminal illness rather than an easily reversible acute cardio-pulmonary event. I explained that DNR/DNI does not change the medical plan and it only comes into effect after a person has arrested (died).  It is a protective measure to keep [place] from harming the patient in their last moments of life.  Gloria [name] was agreeable to DNR/DNI with understanding that patient would not receive CPR, defibrillation, ACLS medications, or intubation.   Discussion:  I discussed with Gloria [name] her hospitalization in the setting of her suspected retroperitoneal bleed.  We reviewed the blood products which she is received since being  here.  She feels that her health is overall quite poor at this point in time.  When asked what her wishes are Gloria [name] continues to defer to her children to do what is the most affordable thing.  I shared with her that it is important we understand what she wants in the life she  wants to lead to help guide additional medical decisions.  A description of the differences between palliative support and hospice support was held:  Palliative care is specialized medical care for people living with a serious illness, such as cancer, heart failure, COPD, Alzheimer's dementia, etc. Patients in palliative care may receive medical care for their symptoms, or palliative care, along with treatment intended to cure their serious illness   Hospice care focuses on the care, comfort, and quality of life of a person with a serious illness who is approaching the end of life. At some point, it may not be possible to cure a serious illness, or a patient may choose not to undergo certain treatments. Hospice is designed for this situation.  Gloria [name] is a strong woman would not want a life dependent on others.  She is hopeful that she can overcome her present health state though does recognize that she has recurrence of her breast cancer and this overall is worrisome in terms of prognosis.  Discussed the importance of continued conversation with family and their  medical providers regarding overall plan of care and treatment options, ensuring decisions are within the context of the patients values and GOCs. ________________________ Addendum:  I called patient's daughter, Gloria [name] and we had a discussion regarding [name]'s hospitalization.  Reviewed as above the difference between palliative and hospice care. Gloria [name] is aware of hospice as her husband passed away and inpatient hospice during the pandemic.   Gloria [name] is agreeable to meet tomorrow morning at 8 AM for further conversations regarding what would be best for her mother.  Both she and her family are all hopeful that her bleeding can be gotten under control and she can get back home with physical therapy.   Decision Maker: Gloria [name],Gloria [name] (Daughter): [phone] (Home Phone)   SUMMARY OF RECOMMENDATIONS   DNAR/DNI  Continue current care --> Allow  time for outcomes  The difference been is between palliative care and hospice care were described  Plan to meet with family at 8 AM tomorrow  PMT for ongoing discussions regarding goals of care  Code Status/Advance Care Planning: DNAR/DNI   Symptom Management:   Palliative Prophylaxis:  Aspiration, Bowel Regimen, Delirium Protocol, Frequent Pain Assessment, Oral Care, Palliative Wound Care, and Turn Reposition  Additional Recommendations (Limitations, Scope, Preferences): Continue current care  Psycho-social/Spiritual:  Desire for further Chaplaincy support: Not presently Additional Recommendations: Discussion of progression of disease-metastatic breast cancer   Prognosis: Hospice is an appropriate consideration at this time though patient is not ready  Discharge Planning: Discharge home once medically optimized  Vitals:   11/24/22 1200 11/24/22 1305  BP:  (!) 152/71  Pulse: 72 69  Resp: 16 (!) 22  Temp:  97.8 F (36.6 C)  SpO2: 92% 97%    Intake/Output Summary (Last 24 hours) at 11/24/2022 1511 Last data filed at 11/24/2022 1400 Gross per 24 hour  Intake 980.01 ml  Output 800 ml  Net 180.01 ml   Last Weight  Most recent update: 11/21/2022  5:44 PM    Weight  57.6 kg (126 lb 15.8 oz)            Gen: Elderly Caucasian female  in no acute distress HEENT: moist mucous membranes CV: Regular rate and rhythm  PULM: On room air breathing is even and unlabored ABD: soft/nontender EXT: No edema Neuro: Alert and oriented x3 -very hard of hearing  PPS: 10%   This conversation/these recommendations were discussed with patient primary care team, Dr. Pietro [name]  Billing based on MDM: High  Problems Addressed: One acute or chronic illness or injury that poses a threat to life or bodily function  Amount and/or Complexity of Data: Category 3:Discussion of management or test interpretation with external physician/other qualified health care professional/appropriate source  (not separately reported)  Risks: Decision regarding hospitalization or escalation of hospital care and Decision not to resuscitate or to de-escalate care because of poor prognosis ______________________________________________________ [place] Team Team Cell Phone: [phone] Please utilize secure chat with additional questions, if there is no response within 30 minutes please call the above phone number  Palliative Medicine Team providers are available by phone from 7am to 7pm daily and can be reached through the team cell phone.  Should this patient require assistance outside of these hours, please call the patient's attending physician.

## 2022-11-24 NOTE — Progress Notes (Signed)
Physical Therapy Treatment Patient Details Name: Gloria [name] MRN: [id number] DOB: 1928-04-25 Today's Date: 11/24/2022   History of Present Illness 89 y.o. female presents to [facility] hospital on 11/21/2022 with confusion, fever, dysuria and abdominal pain. CT scan demonstrates retroperitoneal stranding and worsening R hydronephrosis with a 18m proximal ureteral stone. Pt also noted to be anemic, concern for GIB. Pt underwent ureteral stent placement on 1/24. PMH includes GERD, HTN, UTI, anxiety, CAD.    PT Comments    Pt reamins limited by fatigue but is able to ambulate for increased distances this session. Pt is anxious about stress incontinence during session, standing and immediately sitting back down multiple times due to reports of incontinence. Pt will benefit from continued frequent mobilization in an effort to improve strength and activity tolerance. PT continues to recommend HHPT in hopes that endurance continues to improve.   Recommendations for follow up therapy are one component of a multi-disciplinary discharge planning process, led by the attending physician.  Recommendations may be updated based on patient status, additional functional criteria and insurance authorization.  Follow Up Recommendations  Home health PT (if endurance continues to improve)     Assistance Recommended at Discharge Frequent or constant Supervision/Assistance  Patient can return home with the following A lot of help with walking and/or transfers;A lot of help with bathing/dressing/bathroom;Assistance with cooking/housework;Direct supervision/assist for medications management;Direct supervision/assist for financial management;Assist for transportation;Help with stairs or ramp for entrance   Equipment Recommendations  Wheelchair (measurements PT);Wheelchair cushion (measurements PT)    Recommendations for Other Services       Precautions / Restrictions Precautions Precautions: Fall Precaution Comments:  monitor sats Restrictions Weight Bearing Restrictions: No     Mobility  Bed Mobility Overal bed mobility: Needs Assistance Bed Mobility: Rolling, Sidelying to Sit Rolling: Min guard Sidelying to sit: Min assist       General bed mobility comments: verbal cues for sequencing    Transfers Overall transfer level: Needs assistance Equipment used: Rolling walker (2 wheels) Transfers: Sit to/from Stand Sit to Stand: Min guard           General transfer comment: pt often stands and then abruptly sits 2/2 urinary incontinence.    Ambulation/Gait Ambulation/Gait assistance: Min guard Gait Distance (Feet): 12 Feet Assistive device: Rolling walker (2 wheels) Gait Pattern/deviations: Step-through pattern Gait velocity: reduced Gait velocity interpretation: <1.31 ft/sec, indicative of household ambulator   General Gait Details: slowed step-through gait, increased trunk flexion   Stairs             Wheelchair Mobility    Modified Rankin (Stroke Patients Only)       Balance Overall balance assessment: Needs assistance Sitting-balance support: No upper extremity supported, Feet supported Sitting balance-Leahy Scale: Good     Standing balance support: Single extremity supported, Bilateral upper extremity supported, Reliant on assistive device for balance Standing balance-Leahy Scale: Poor                              Cognition Arousal/Alertness: Awake/alert Behavior During Therapy: Anxious Overall Cognitive Status: Within Functional Limits for tasks assessed                                          Exercises      General Comments General comments (skin integrity, edema, etc.): pt on RA, sats in  low 90s at this time      Pertinent Vitals/Pain Pain Assessment Pain Assessment: Faces Faces Pain Scale: Hurts little more Pain Location: back Pain Descriptors / Indicators: Aching Pain Intervention(s): Monitored during session     Home Living                          Prior Function            PT Goals (current goals can now be found in the care plan section) Acute Rehab PT Goals Patient Stated Goal: to improve strength and endurance, return to walking Progress towards PT goals: Progressing toward goals    Frequency    Min 3X/week      PT Plan Current plan remains appropriate    Co-evaluation              AM-PAC PT "6 Clicks" Mobility   Outcome Measure  Help needed turning from your back to your side while in a flat bed without using bedrails?: A Little Help needed moving from lying on your back to sitting on the side of a flat bed without using bedrails?: A Little Help needed moving to and from a bed to a chair (including a wheelchair)?: A Little Help needed standing up from a chair using your arms (e.g., wheelchair or bedside chair)?: A Little Help needed to walk in hospital room?: A Lot Help needed climbing 3-5 steps with a railing? : Total 6 Click Score: 15    End of Session   Activity Tolerance: Patient limited by fatigue Patient left: in chair;with call bell/phone within reach;with chair alarm set;with family/visitor present Nurse Communication: Mobility status PT Visit Diagnosis: Other abnormalities of gait and mobility (R26.89);Muscle weakness (generalized) (M62.81)     Time: 0459-9774 PT Time Calculation (min) (ACUTE ONLY): 23 min  Charges:  $Gait Training: 8-22 mins $Therapeutic Activity: 8-22 mins                     Zenaida Niece, PT, DPT Acute Rehabilitation Office [place] 11/24/2022, 1:15 PM

## 2022-11-24 NOTE — Progress Notes (Signed)
Pharmacy Antibiotic Note  Gloria [name] is a 89 y.o. female admitted on 11/21/2022 with sepsis.  Blood and urine cultures grow Kleb pneumo.  Pharmacy has been consulted for aztreonam dosing.  She has multiple allergies listed. Didn't find cephalosporins administration in Epic.   Renal function improving slowly.  Afebrile, WBC WNL.  Plan: Continue aztreonam 1g IV Q8H Monitor renal fxn, clinical progress, abx LOT  Height: 5' (152.4 cm) Weight: 57.6 kg (126 lb 15.8 oz) IBW/kg (Calculated) : 45.5  Temp (24hrs), Avg:97.8 F (36.6 C), Min:97 F (36.1 C), Max:98.5 F (36.9 C)  Recent Labs  Lab 11/21/22 1758 11/21/22 1948 11/22/22 0041 11/22/22 0204 11/22/22 0500 11/23/22 0305 11/24/22 0835  WBC 7.1  --   --   --  6.3 9.2 6.2  CREATININE 1.41*  --   --  1.10* 1.47* 1.39* 1.29*  LATICACIDVEN 3.6* 5.7* 2.3*  --   --   --   --     Estimated Creatinine Clearance: 22.1 mL/min (A) (by C-G formula based on SCr of 1.29 mg/dL (H)).    Allergies  Allergen Reactions   Contrast Media [Iodinated Contrast Media] Anaphylaxis    09/19/17-Pt premedicated. Then pt stated she was DNR. Spoke with [name] who did not realize allergy was recorded as anaphylactic. [name] verbally gave ok to change CT exam to without IV contrast. Pt and daughter do not recall occurrence of anaphylactic reaction to contrast media. Geanie Kenning, 3:27pm   Penicillins Anaphylaxis and Swelling    Has patient had a PCN reaction causing immediate rash, facial/tongue/throat swelling, SOB or lightheadedness with hypotension: Yes Has patient had a PCN reaction causing severe rash involving mucus membranes or skin necrosis: No Has patient had a PCN reaction that required hospitalization Yes Has patient had a PCN reaction occurring within the last 10 years: No If all of the above answers are "NO", then may proceed with Cephalosporin use.    Adhesive [Tape] Other (See Comments)    blisters   Cephalexin Swelling    Ciprofloxacin Swelling   Demerol Nausea And Vomiting   Sulfa Antibiotics    Sulfamethoxazole-Trimethoprim Itching and Swelling    REACTION: swelling/hives   Quinolones Itching and Rash    REACTION: itching, rash Pt. Reports no problems with levaquin    Vanc 1/23 >> 1/24 Azactam 1/23 >>   1/23 BCx - 1/2 Kleb pneumo (S Ancef, Cipro, Primaxin, Bactrim) 1/23 UCx - Kleb pneumo 1/23 MRSA PCR -   Kaelen Caughlin D. Mina Marble, PharmD, BCPS, [place] 11/24/2022, 10:48 AM

## 2022-11-24 NOTE — Telephone Encounter (Signed)
Daughter wanted to let Dr. Claiborne Billings know that the patient is at [facility]th Floor, Room 13 and is being treated for sepsis.  Daughter is concerned patient is having swelling in her legs.

## 2022-11-24 NOTE — Progress Notes (Signed)
Family at the bedside. Pt states she "sleeping awFamily at the bedside. Pt states she "sleeping away and feels like dying, but go ahead and go oven with this Penicillin". Amoxicillin was given at 1600. Expiratory wheezes are present prior to giving it. VS every 15 min. Family at the bedside. DNR bracelet is on. Pt speaking about "her mother was there" Pt and family crying. Emotional support provided.MD notified about situation.  Xanax given for anxiety. Family has Flovent from home helping her with wheezes. OK to give per MD, will place an order. VS stable hour after administering medication, no adverse reaction noted.

## 2022-11-24 NOTE — Progress Notes (Incomplete)
Miscellaneous Pharmacy Monitoring   ASSESSMENT: Discussed Gloria [name]'s penicillin allergy with both herself and her granddaughter present. She has an extensive allergy list and she has a positive blood culture with Klebsiella pneumoniae that is sensitive to many antibiotic options including cephalosporins. She states her reaction to penicillins was ~60 years ago. She stated the reaction was hives that required her to go to the hospital for treatment. She stated there was no blistering or ulceration and no difficulty breathing. She believes she had Augmentin for a sinus infection ~2 years ago, however after calling her pharmacies, they do not have record of this. After discussion of the risks and benefits she has agreed to proceed with amoxicillin oral challenge.   History of allergy:  Low Risk- PENFAST score 1  Type of intervention (select all that apply):  Amoxicillin Oral Challenge  Impact on therapy (select all that apply): Potential for  Penicillin allergy removed   PLAN: - amoxicillin 500 mg PO x1  - Q23mn vitals monitoring x 1 hour  - PRN Epi-Pen - PRN IV diphenhydramine - Plan relayed to primary RN   AAdria Dill PharmD PGY-2 Infectious Diseases Resident  11/24/2022 3:08 PM

## 2022-11-25 DIAGNOSIS — N3001 Acute cystitis with hematuria: Secondary | ICD-10-CM | POA: Diagnosis not present

## 2022-11-25 DIAGNOSIS — Z7189 Other specified counseling: Secondary | ICD-10-CM | POA: Diagnosis not present

## 2022-11-25 DIAGNOSIS — Z515 Encounter for palliative care: Secondary | ICD-10-CM | POA: Diagnosis not present

## 2022-11-25 LAB — CBC WITH DIFFERENTIAL/PLATELET
Abs Immature Granulocytes: 0.04 10*3/uL (ref 0.00–0.07)
Basophils Absolute: 0 10*3/uL (ref 0.0–0.1)
Basophils Relative: 1 %
Eosinophils Absolute: 0.3 10*3/uL (ref 0.0–0.5)
Eosinophils Relative: 6 %
HCT: 29.4 % — ABNORMAL LOW (ref 36.0–46.0)
Hemoglobin: 9.5 g/dL — ABNORMAL LOW (ref 12.0–15.0)
Immature Granulocytes: 1 %
Lymphocytes Relative: 10 %
Lymphs Abs: 0.4 10*3/uL — ABNORMAL LOW (ref 0.7–4.0)
MCH: 29.8 pg (ref 26.0–34.0)
MCHC: 32.3 g/dL (ref 30.0–36.0)
MCV: 92.2 fL (ref 80.0–100.0)
Monocytes Absolute: 0.4 10*3/uL (ref 0.1–1.0)
Monocytes Relative: 9 %
Neutro Abs: 3.2 10*3/uL (ref 1.7–7.7)
Neutrophils Relative %: 73 %
Platelets: 153 10*3/uL (ref 150–400)
RBC: 3.19 MIL/uL — ABNORMAL LOW (ref 3.87–5.11)
RDW: 17.1 % — ABNORMAL HIGH (ref 11.5–15.5)
WBC: 4.4 10*3/uL (ref 4.0–10.5)
nRBC: 0 % (ref 0.0–0.2)

## 2022-11-25 LAB — BASIC METABOLIC PANEL
Anion gap: 10 (ref 5–15)
BUN: 26 mg/dL — ABNORMAL HIGH (ref 8–23)
CO2: 18 mmol/L — ABNORMAL LOW (ref 22–32)
Calcium: 8.3 mg/dL — ABNORMAL LOW (ref 8.9–10.3)
Chloride: 109 mmol/L (ref 98–111)
Creatinine, Ser: 1.14 mg/dL — ABNORMAL HIGH (ref 0.44–1.00)
GFR, Estimated: 45 mL/min — ABNORMAL LOW (ref >60)
Glucose, Bld: 80 mg/dL (ref 70–99)
Potassium: 4 mmol/L (ref 3.5–5.1)
Sodium: 137 mmol/L (ref 135–145)

## 2022-11-25 LAB — HAPTOGLOBIN: Haptoglobin: 245 mg/dL (ref 41–333)

## 2022-11-25 MED ORDER — CEFADROXIL 500 MG PO CAPS
500.0000 mg | ORAL_CAPSULE | Freq: Two times a day (BID) | ORAL | Status: DC
  Administered 2022-11-25 – 2022-11-29 (×8): 500 mg via ORAL
  Filled 2022-11-25 (×11): qty 1

## 2022-11-25 MED ORDER — ACETAMINOPHEN 500 MG PO TABS
1000.0000 mg | ORAL_TABLET | Freq: Three times a day (TID) | ORAL | Status: AC
  Administered 2022-11-25 – 2022-11-28 (×7): 1000 mg via ORAL
  Filled 2022-11-25 (×9): qty 2

## 2022-11-25 NOTE — Progress Notes (Signed)
Mobility Specialist Progress Note   11/25/22 1600  Mobility  Activity Ambulated with assistance in hallway;Transferred from bed to chair  Level of Assistance Contact guard assist, steadying assist  Assistive Device Front wheel walker  Distance Ambulated (ft) 15 ft  Range of Motion/Exercises Active;All extremities  Activity Response Tolerated well   Patient received in supine and agreeable to participate with encouragement. Was supervision for bed mobility and stood with minimal HHA + cues for hand placement. Ambulated short distance in room to and to recliner chair with min guard and slow steady gait. Tolerated without complaint or incident. Was left in recliner with all needs met, call bell in reach.   [place] Geoffrey Hynes, BS EXP Mobility Specialist Please contact via SecureChat or Rehab office at [phone]

## 2022-11-25 NOTE — Progress Notes (Signed)
Miscellaneous Pharmacy Monitoring   ASSESSMENT: Discussed Gloria [name]'s penicillin allergy with both Gloria [name] and Gloria [name] present. She has an extensive allergy list and she has a positive blood culture with Klebsiella pneumoniae that is sensitive to many antibiotic options including cephalosporins. She states Gloria reaction to penicillins was ~60 years ago. She stated the reaction was hives that required Gloria to go to the hospital for treatment. She stated there was no blistering or ulceration and no difficulty breathing. She believes she had Augmentin for a sinus infection ~2 years ago, however after calling Gloria pharmacies, they do not have record of this. After discussion of the risks and benefits she has agreed to proceed with amoxicillin oral challenge.   History of allergy:  Low Risk- PENFAST score 1  Type of intervention (select all that apply):  Amoxicillin Oral Challenge  Impact on therapy (select all that apply): Potential for  Penicillin allergy removed  1/27 No allergic reaction to amoxicillin  PLAN: - amoxicillin 500 mg PO x1 1/26  - Q53mn vitals monitoring x 1 hour  - PRN Epi-Pen - PRN IV diphenhydramine - Plan relayed to primary RN   LBenetta Spar PharmD, BCPS, BCCP Clinical Pharmacist  Please check AMION for all [place] numbers After 10:00 PM, call [phone]

## 2022-11-25 NOTE — Progress Notes (Signed)
Palliative Medicine Inpatient Follow Up Note HPI: Gloria [name] is a 89 y.o. female with PMH significant for HTN, nonobstructive CAD, left breast cancer, anxiety/depression who recently had ankle injury and subsequently developed DVT and was started on Eliquis.    Palliative care has been asked to get involved in the setting of concern for RP bleed in the setting met from breast ca. Plan to further address goals of care.   Today's Discussion 11/25/2022  *Please note that this is a verbal dictation therefore any spelling or grammatical errors are due to the "[place]" system interpretation.  Chart reviewed inclusive of vital signs, progress notes, laboratory results, and diagnostic images.   A family meeting was held this morning in the presence of patients son, Gloria [name], daughter Gloria [name], and patient.   Discussed patients present health state. Reviewed that she, at this time has DVTs, metastatic breast cancer, a UTI, and a PNA. We reviewed the concern as it relates to patients ongoing blood loss anemia. Discussed if the bleeding is unable to be controlled conservatively the reality that patients life may be cut short.   Patient family do understand the significant of her illness. They determined as a family the goals for continued care with antibiotics and present treatments. They would like her optimized to go home with [facility] and OP Palliative support if possible. WE reviewed if patient should worsen at home the thought of them transitioning to hospice. I was able to explain Palliative care versus Hospice care in detail again.   Created space and opportunity for patient to explore thoughts feelings and fears regarding current medical situation. Patient at this time realizes her time is limited though wants it to be the best it can be.  ______________________________________ Addendum:  I spoke to patients daughter this afternoon. She shares with me that she would like for the patient to  go to inpatient hospice. I expressed my concerns that at this time she will not qualify as she is still ambulatory, eating and drinking well, and does not have significant symptom needs. I shared this is preserved for patients < 2 weeks from the end of their life.   At this time patients daughter does not feel she is going to be able to care for the patient at home. Reviewed we would further consider options and discuss these with the [facility] team.  Questions and concerns addressed/Palliative Support Provided.   Objective Assessment: Vital Signs Vitals:   11/25/22 0913 11/25/22 1122  BP:  138/70  Pulse: 87 73  Resp: 18 19  Temp:  97.7 F (36.5 C)  SpO2: 95% 96%    Intake/Output Summary (Last 24 hours) at 11/25/2022 1250 Last data filed at 11/25/2022 1112 Gross per 24 hour  Intake 1002.35 ml  Output 550 ml  Net 452.35 ml   Last Weight  Most recent update: 11/21/2022  5:44 PM    Weight  57.6 kg (126 lb 15.8 oz)            Gen: Elderly Caucasian female in no acute distress HEENT: moist mucous membranes CV: Regular rate and rhythm  PULM: On room air breathing is even and unlabored ABD: soft/nontender EXT: No edema Neuro: Alert and oriented x3 -very hard of hearing  SUMMARY OF RECOMMENDATIONS   DNAR/DNI   Continue current care --> Allow time for outcomes   The difference been is between palliative care and hospice care were described, again  Patients family is hopeful for patient to get evaluated  for inpatient hospice --> TOC aware and this is pending  PMT for ongoing discussions regarding goals of care  Time Spent: 65 Billing based on MDM: High ______________________________________________________________________________________ [facility] Team Team Cell Phone: [phone] Please utilize secure chat with additional questions, if there is no response within 30 minutes please call the above phone number  Palliative Medicine Team  providers are available by phone from 7am to 7pm daily and can be reached through the team cell phone.  Should this patient require assistance outside of these hours, please call the patient's attending physician.

## 2022-11-25 NOTE — TOC Progression Note (Addendum)
Transition of Care [facility]) - Progression Note    Patient Details  Name: Gloria [name] MRN: [id number] Date of Birth: [birth year]  Transition [facility] CM/SW Contact  Bartholomew Crews, RN Phone Number: [phone] 11/25/2022, 10:15 AM  Clinical Narrative:     Acknowledging [facility] consult for referral to [facility] concerning hospice vs palliative services. Appreciate palliative NP, Sharyn Lull, for reaching to [facility] this AM to discuss referral. Spoke with family at the bedside to discuss referral and level of support received for hospice vs palliative. RNCM reached out to [facility] on call service, left message with contact information. TOC following for transition needs.   UPDATE: Spoke with Tharon Aquas at [facility] - referral placed for hospice - inpatient vs home. HOP to follow up with daughter.   Additional information provided to daughter earlier this morning during visit to bedside - daughter expressed concerns about caring for patient at home and how to clean her up. She stated patient has purewick here in hospital. Advised that purewick can be purchased for home use and is a private pay cost. Daughter to look into ordering home device.   Expected Discharge Plan: Home w Hospice Care Barriers to Discharge: Continued Medical Work up  Expected Discharge Plan and Services       Living arrangements for the past 2 months: Single Family Home                                       Social Determinants of Health (SDOH) Interventions SDOH Screenings   Tobacco Use: Medium Risk (11/24/2022)    Readmission Risk Interventions     No data to display

## 2022-11-25 NOTE — Progress Notes (Signed)
PROGRESS NOTE  Gloria [name]  DOB: [birth year]  PCP: Charlane Ferretti, MD QBH:[id number]  DOA: 11/21/2022  LOS: 3 days  Hospital Day: 5  Brief narrative: Gloria [name] is a 89 y.o. female with PMH significant for HTN, nonobstructive CAD, left breast cancer, anxiety/depression who recently had ankle injury and subsequently developed DVT and was started on Eliquis.  This was complicated by intermittent melena but she remained on Eliquis. 1/23, patient's daughter took her to PCP with complaint of increased urinary frequency, dehydration and worsening confusion for 3 days.  She was sent home and suggested to increase hydration.  At home, her confusion worsened and EMS was called.  EMS noted her to have shortness of breath and wheezing all over.  En route, she received albuterol and Solu-Medrol.  In the ED, she had a fever of 102.9, heart rate in 130s, blood pressure 120s, required 2 L oxygen by nasal cannula She also complained of urinary urgency, frequency, dysuria and abdominal pain.  Labs showed elevated BNP at 129, troponin normal, WBC normal at 7.1, hemoglobin low at 5.6, platelet normal at 227, lactic acid elevated to 3.6, BUN/creatinine elevated to 28/1.41, INR 2.2 Respiratory virus panel negative for flu, RSV, COVID Chest x-ray unremarkable. FOBT positive Urinalysis showed yellow hazy color urine with moderate amount of blood, moderate leukocytes, positive nitrate and many bacteria  CT chest, abdomen pelvis showed 1. Diffuse inflammatory stranding surrounding the pancreas, duodenum, proximal small bowel, and descending colon. Findings are worrisome for acute pancreatitis. 2. Bladder wall thickening with surrounding inflammatory stranding worrisome for cystitis. 3. 2 mm calculus in the proximal right ureter with mild right-sided hydronephrosis. 4. Small amount of airspace disease in the lingula may represent atelectasis or infection. 6. New diffuse retroperitoneal stranding and left  pararenal stranding of uncertain etiology. Findings may be related to retroperitoneal hemorrhage, infectious/inflammatory process, or less likely retroperitoneal fibrosis.  Blood culture and urine culture were sent. Patient was started on broad-spectrum IV antibiotics. Patient was seen by urology. She underwent emergent cystoscopy, right ureteral stent placement. Postprocedure, she was also seen by PCCM.  She did not require transfer to ICU. Admitted to hospitalist service See below for details  MRI abdomen 1/24 showed 1. Increase in retroperitoneal soft tissue now encasing vascular structures and narrowing venous structures in the retroperitoneum and affecting LEFT perinephric space greater than RIGHT. Findings concerning for infiltrative breast cancer metastasis.   2. Marked LEFT-sided perinephric stranding with persistent longstanding hydronephrosis, persistent but improved right-sided hydronephrosis  3. Sludge in the gallbladder. No biliary duct dilation involving intrahepatic biliary tree. No choledocholithiasis. 4. While stranding is seen about the pancreas, but unlikely to be pancreatitis.     Subjective: Patient was seen and examined this morning.  Pleasant elderly Caucasian. Hard of hearing.  Propped up in bed. Son and daughter at bedside.  Pain controlled. Palliative care consult appreciated.  Patient and family have chosen hospice care. But for now, while the arrangements are being made at home, family does not want to stop or downgrade her medications.  Assessment/Plan: Severe sepsis secondary to UTI - POA Obstructive right ureterolithiasis with mild hydronephrosis Klebsiella pneumonia and blood culture and urine culture Presented with dysuria, frequency, dehydration, worsening confusion Urinalysis showed yellow hazy color urine with moderate amount of blood, moderate leukocytes, positive nitrate and many bacteria CT scan showed 2 mm proximal right ureteral calculus with  mild right-sided hydronephrosis, bladder wall thickening. S/p emergent right ureteral stent placement -1/23 Currently on IV aztreonam (Multiple  allergies listed). Blood culture and urine culture sent on admission grew Klebsiella pneumoniae.  Discussed with pharmacist for oral antibiotic choice.  Patient tolerated penicillin challenge test.  Antibiotics switched to oral Duricef. No recurrence of fever since stent placement.  WBC count normal.  Lactic acid downtrending.   Urology removed Foley catheter on 1/25. Recent Labs  Lab 11/21/22 1758 11/21/22 1948 11/22/22 0041 11/22/22 0500 11/23/22 0305 11/24/22 0512 11/24/22 0835 11/25/22 0217  WBC 7.1  --   --  6.3 9.2  --  6.2 4.4  LATICACIDVEN 3.6* 5.7* 2.3*  --   --   --   --   --   PROCALCITON  --   --   --  26.72 22.77 17.83  --   --    Possible progressive metastatic breast cancer Patient has history of blood pressure cancer and follows up with [name]. MRI as above showed increasing retroperitoneal soft tissue encasing vascular structures as well as affecting left perinephric space greater than right.  Findings concerning for infiltrative breast cancer metastasis. It also showed peripancreatic stranding less likely to be acute pancreatitis. Per oncology, patient is not a candidate for any chemo or radiation.  Hospice being considered. Family wanted her to be started on Tylenol 1 g 3 times daily and Xanax for better pain control.  I ordered the same.  Bilateral lower extremity DVT 1/24, ultrasound duplex of lower extremities showed acute DVT in the right gastrocnemius veins as well as left peroneal veins. However Eliquis cannot be used as of now because of significant anemia. Discussed with IR about IVC filter.  Patient is a poor candidate for it.  Acute blood loss anemia Presented with hemoglobin of 5.6. Per history, patient was recently started on Eliquis after DVT following an ankle injury.  She has noticed intermittent melena  since then but continued Eliquis. FOBT positive in the ED CT scan also showed diffuse retroperitoneal stranding and left pararenal standing suspicious for retroperitoneal hemorrhage. Currently Eliquis on hold.  Received 3 units of PRBC so far.  Hemoglobin 9.5 today.  GI following. Continue to monitor.  Target hemoglobin more than 8. Recent Labs    11/22/22 0204 11/22/22 0500 11/23/22 0305 11/24/22 0835 11/25/22 0217  HGB 9.2* 9.1* 7.0* 9.5* 9.5*  MCV  --  91.5 89.8 92.0 92.2  FERRITIN  --   --  27  --   --   TIBC  --   --  298  --   --   IRON  --   --  18*  --   --   RETICCTPCT  --   --  3.0  --   --    Suspected aspiration pneumonia Acute respiratory failure with hypoxia EMS noted shortness of breath, wheezing given albuterol and Solu-Medrol. CT chest showed airspace disease in lingula. Suspect aspiration pneumonia related to confusion Dyspnea likely from combination of pneumonia and severe anemia. Currently on 2 L oxygen by nasal cannula. Continue to monitor.  Check ambulatory oxygen requirement Continue bronchodilators as needed  AKI Baseline creatinine less than 1.2.  Creatinine this morning was 1.47.  Related to sepsis. Creatinine improving.  1.29 today. Recent Labs    05/19/22 1252 06/22/22 1143 08/25/22 1157 09/30/22 0940 11/21/22 1758 11/22/22 0204 11/22/22 0500 11/23/22 0305 11/24/22 0835 11/25/22 0217  BUN 23 28* 26* 21 28* 23 25* 28* 31* 26*  CREATININE 1.16* 1.15* 1.25* 1.10* 1.41* 1.10* 1.47* 1.39* 1.29* 1.14*   Essential hypertension PTA on HCTZ 12.5 mg  daily, losartan twice daily (50 mg a.m. and 25 mg p.m.) Currently BP meds on hold.  Recent ankle injury followed by DVT Eliquis on hold Pain control with Tylenol PT eval ordered  Anxiety/depression PTA on Xanax, doxepin  Mobility: PT eval ordered.  Home with PT recommended it seems  Goals of care   Code Status: DNR. Palliative care consult appreciated..   DVT prophylaxis: Positive for  bilateral lower EXTR DVT but currently unable to use blood thinners because of acute bleeding and anemia   Antimicrobials: Oral Duricef Fluid: Not on IV fluid Consultants: GI, PCCM, urology, oncology, Palliative care Family Communication: Son and daughter at bedside  Status is: Inpatient Level of care: Progressive   Dispo: Patient is from: Home            Anticipated d/c is to: Pending clinical course Continue in-hospital care because: Not medically stable for discharge.  Continues to have anemia.  Remains on IV antibiotics  Scheduled Meds:  acetaminophen  1,000 mg Oral Q8H   budesonide  0.25 mg Nebulization BID   cefadroxil  500 mg Oral BID   doxepin  10 mg Oral QHS   [START ON 11/27/2022] ferrous sulfate  325 mg Oral Q breakfast   fluticasone  1 spray Each Nare BID   pantoprazole (PROTONIX) IV  40 mg Intravenous Q12H    PRN meds: ALPRAZolam, diphenhydrAMINE, EPINEPHrine, guaiFENesin, hydrALAZINE, levalbuterol, morphine injection, pseudoephedrine **AND** [DISCONTINUED] acetaminophen, zolpidem   Infusions:   ferric gluconate (FERRLECIT) IVPB 250 mg (11/25/22 1118)    Diet:  Diet Order             Diet clear liquid Room service appropriate? Yes; Fluid consistency: Thin  Diet effective now                   Antimicrobials: Anti-infectives (From admission, onward)    Start     Dose/Rate Route Frequency Ordered Stop   11/25/22 1800  cefadroxil (DURICEF) capsule 500 mg        500 mg Oral 2 times daily 11/25/22 0946 12/05/22 0759   11/24/22 1600  amoxicillin (AMOXIL) capsule 500 mg        500 mg Oral  Once 11/24/22 1509 11/24/22 1603   11/23/22 1800  vancomycin (VANCOREADY) IVPB 750 mg/150 mL  Status:  Discontinued        750 mg 150 mL/hr over 60 Minutes Intravenous Every 48 hours 11/21/22 2038 11/22/22 1246   11/22/22 0200  aztreonam (AZACTAM) 1 g in sodium chloride 0.9 % 100 mL IVPB        1 g 200 mL/hr over 30 Minutes Intravenous Every 8 hours 11/21/22 2038  11/25/22 0948   11/21/22 1800  aztreonam (AZACTAM) 2 g in sodium chloride 0.9 % 100 mL IVPB        2 g 200 mL/hr over 30 Minutes Intravenous  Once 11/21/22 1755 11/21/22 1847   11/21/22 1800  metroNIDAZOLE (FLAGYL) IVPB 500 mg        500 mg 100 mL/hr over 60 Minutes Intravenous  Once 11/21/22 1755 11/21/22 1932   11/21/22 1800  vancomycin (VANCOCIN) IVPB 1000 mg/200 mL premix        1,000 mg 200 mL/hr over 60 Minutes Intravenous  Once 11/21/22 1755 11/21/22 1932       Skin assessment:       Nutritional status:  Body mass index is 24.8 kg/m.          Objective: Vitals:   11/25/22 0913  11/25/22 1122  BP:  138/70  Pulse: 87 73  Resp: 18 19  Temp:  97.7 F (36.5 C)  SpO2: 95% 96%    Intake/Output Summary (Last 24 hours) at 11/25/2022 1347 Last data filed at 11/25/2022 1112 Gross per 24 hour  Intake 570 ml  Output 450 ml  Net 120 ml   Filed Weights   11/21/22 1743  Weight: 57.6 kg   Weight change:  Body mass index is 24.8 kg/m.   Physical Exam: General exam: Pleasant, elderly Caucasian female.  Not in distress. Skin: No rashes, lesions or ulcers. HEENT: Atraumatic, normocephalic, no obvious bleeding Lungs: Diminished air entry in both bases, otherwise clear to auscultation bilaterally CVS: Regular rate and rhythm, no murmur GI/Abd soft, nondistended, mild tenderness present in epigastrium, bowel sound present, CNS: Alert, awake, oriented x 3.  Slow to respond.  Extremely hard of hearing Psychiatry: Sad affect Extremities: Mild edema both legs.  Data Review: I have personally reviewed the laboratory data and studies available.  F/u labs  Unresulted Labs (From admission, onward)     Start     Ordered   11/25/22 0500  CBC with Differential/Platelet  Daily,   R     Question:  Specimen collection method  Answer:  Lab=Lab collect   11/24/22 0819   11/25/22 3086  Basic metabolic panel  Daily,   R     Question:  Specimen collection method  Answer:  Lab=Lab  collect   11/24/22 0819   11/21/22 2040  MRSA Next Gen by PCR, Nasal  (MRSA Screening)  Once,   URGENT        11/21/22 2039            Total time spent in review of labs and imaging, patient evaluation, formulation of plan, documentation and communication with family: 72 minutes  Signed, Terrilee Croak, MD [facility] 11/25/2022

## 2022-11-26 DIAGNOSIS — Z515 Encounter for palliative care: Secondary | ICD-10-CM | POA: Diagnosis not present

## 2022-11-26 DIAGNOSIS — N3001 Acute cystitis with hematuria: Secondary | ICD-10-CM | POA: Diagnosis not present

## 2022-11-26 LAB — CBC WITH DIFFERENTIAL/PLATELET
Abs Immature Granulocytes: 0.05 10*3/uL (ref 0.00–0.07)
Basophils Absolute: 0 10*3/uL (ref 0.0–0.1)
Basophils Relative: 1 %
Eosinophils Absolute: 0.3 10*3/uL (ref 0.0–0.5)
Eosinophils Relative: 7 %
HCT: 32.3 % — ABNORMAL LOW (ref 36.0–46.0)
Hemoglobin: 9.7 g/dL — ABNORMAL LOW (ref 12.0–15.0)
Immature Granulocytes: 1 %
Lymphocytes Relative: 8 %
Lymphs Abs: 0.3 10*3/uL — ABNORMAL LOW (ref 0.7–4.0)
MCH: 29 pg (ref 26.0–34.0)
MCHC: 30 g/dL (ref 30.0–36.0)
MCV: 96.4 fL (ref 80.0–100.0)
Monocytes Absolute: 0.4 10*3/uL (ref 0.1–1.0)
Monocytes Relative: 9 %
Neutro Abs: 3 10*3/uL (ref 1.7–7.7)
Neutrophils Relative %: 74 %
Platelets: 171 10*3/uL (ref 150–400)
RBC: 3.35 MIL/uL — ABNORMAL LOW (ref 3.87–5.11)
RDW: 17.3 % — ABNORMAL HIGH (ref 11.5–15.5)
WBC: 4 10*3/uL (ref 4.0–10.5)
nRBC: 0 % (ref 0.0–0.2)

## 2022-11-26 LAB — BASIC METABOLIC PANEL
Anion gap: 15 (ref 5–15)
BUN: 24 mg/dL — ABNORMAL HIGH (ref 8–23)
CO2: 16 mmol/L — ABNORMAL LOW (ref 22–32)
Calcium: 8.4 mg/dL — ABNORMAL LOW (ref 8.9–10.3)
Chloride: 105 mmol/L (ref 98–111)
Creatinine, Ser: 1.21 mg/dL — ABNORMAL HIGH (ref 0.44–1.00)
GFR, Estimated: 42 mL/min — ABNORMAL LOW (ref >60)
Glucose, Bld: 71 mg/dL (ref 70–99)
Potassium: 4 mmol/L (ref 3.5–5.1)
Sodium: 136 mmol/L (ref 135–145)

## 2022-11-26 MED ORDER — PANTOPRAZOLE SODIUM 40 MG PO TBEC
40.0000 mg | DELAYED_RELEASE_TABLET | Freq: Two times a day (BID) | ORAL | Status: DC
  Administered 2022-11-26 – 2022-11-29 (×6): 40 mg via ORAL
  Filled 2022-11-26 (×6): qty 1

## 2022-11-26 NOTE — Progress Notes (Signed)
PROGRESS NOTE  Gloria [name]  DOB: 05-22-1928  PCP: Charlane Ferretti, MD LNL:[id number]  DOA: 11/21/2022  LOS: 4 days  Hospital Day: 6  Brief narrative: Gloria [name] is a 89 y.o. female with PMH significant for HTN, nonobstructive CAD, left breast cancer, anxiety/depression who recently had ankle injury and subsequently developed DVT and was started on Eliquis.  This was complicated by intermittent melena but she remained on Eliquis. 1/23, patient's daughter took her to PCP with complaint of increased urinary frequency, dehydration and worsening confusion for 3 days.  She was sent home and suggested to increase hydration.  At home, her confusion worsened and EMS was called.  EMS noted her to have shortness of breath and wheezing all over.  En route, she received albuterol and Solu-Medrol.  In the ED, she had a fever of 102.9, heart rate in 130s, blood pressure 120s, required 2 L oxygen by nasal cannula She also complained of urinary urgency, frequency, dysuria and abdominal pain.  Labs showed elevated BNP at 129, troponin normal, WBC normal at 7.1, hemoglobin low at 5.6, platelet normal at 227, lactic acid elevated to 3.6, BUN/creatinine elevated to 28/1.41, INR 2.2 Respiratory virus panel negative for flu, RSV, COVID Chest x-ray unremarkable. FOBT positive Urinalysis showed yellow hazy color urine with moderate amount of blood, moderate leukocytes, positive nitrate and many bacteria  CT chest, abdomen pelvis showed 1. Diffuse inflammatory stranding surrounding the pancreas, duodenum, proximal small bowel, and descending colon. Findings are worrisome for acute pancreatitis. 2. Bladder wall thickening with surrounding inflammatory stranding worrisome for cystitis. 3. 2 mm calculus in the proximal right ureter with mild right-sided hydronephrosis. 4. Small amount of airspace disease in the lingula may represent atelectasis or infection. 6. New diffuse retroperitoneal stranding and left  pararenal stranding of uncertain etiology. Findings may be related to retroperitoneal hemorrhage, infectious/inflammatory process, or less likely retroperitoneal fibrosis.  Blood culture and urine culture were sent. Patient was started on broad-spectrum IV antibiotics. Patient was seen by urology. She underwent emergent cystoscopy, right ureteral stent placement. Postprocedure, she was also seen by PCCM.  She did not require transfer to ICU. Admitted to hospitalist service See below for details  MRI abdomen 1/24 showed 1. Increase in retroperitoneal soft tissue now encasing vascular structures and narrowing venous structures in the retroperitoneum and affecting LEFT perinephric space greater than RIGHT. Findings concerning for infiltrative breast cancer metastasis.   2. Marked LEFT-sided perinephric stranding with persistent longstanding hydronephrosis, persistent but improved right-sided hydronephrosis  3. Sludge in the gallbladder. No biliary duct dilation involving intrahepatic biliary tree. No choledocholithiasis. 4. While stranding is seen about the pancreas, but unlikely to be pancreatitis.     Subjective: Patient was seen and examined this morning.  Lying on bed.  Not in distress.  She states she had a good sleep last night.  Daughter and palliative care nurse practitioner at bedside.  At this time, patient and family has decided to avoid aggressive care.  They are hoping that she would be accepted at residential hospice facility. For now, she is not clearly comfort care measures but we are minimizing any other aggressive regimen and focusing on quality of life.  Assessment/Plan: Severe sepsis secondary to UTI - POA Obstructive right ureterolithiasis with mild hydronephrosis Klebsiella pneumonia and blood culture and urine culture Presented with dysuria, frequency, dehydration, worsening confusion Urinalysis showed yellow hazy color urine with moderate amount of blood, moderate  leukocytes, positive nitrate and many bacteria CT scan showed 2 mm  proximal right ureteral calculus with mild right-sided hydronephrosis, bladder wall thickening. S/p emergent right ureteral stent placement -1/23 Currently on IV aztreonam (Multiple allergies listed). Blood culture and urine culture sent on admission grew Klebsiella pneumoniae.  Discussed with pharmacist for oral antibiotic choice.  Patient tolerated penicillin challenge test.  Antibiotics switched to oral Duricef. No recurrence of fever since stent placement.  WBC count normal.  Avoid further blood works Urology removed Foley catheter on 1/25. Recent Labs  Lab 11/21/22 1758 11/21/22 1948 11/22/22 0041 11/22/22 0500 11/23/22 0305 11/24/22 0512 11/24/22 0835 11/25/22 0217 11/26/22 0358  WBC 7.1  --   --  6.3 9.2  --  6.2 4.4 4.0  LATICACIDVEN 3.6* 5.7* 2.3*  --   --   --   --   --   --   PROCALCITON  --   --   --  26.72 22.77 17.83  --   --   --     Possible progressive metastatic breast cancer Patient has history of blood pressure cancer and follows up with [name]. MRI as above showed increasing retroperitoneal soft tissue encasing vascular structures as well as affecting left perinephric space greater than right.  Findings concerning for infiltrative breast cancer metastasis. It also showed peripancreatic stranding less likely to be acute pancreatitis. Per oncology, patient is not a candidate for any chemo or radiation.  Hospice being considered. Family wanted her to be started on Tylenol 1 g 3 times daily and Xanax for better pain control.  I ordered the same.  Bilateral lower extremity DVT 1/24, ultrasound duplex of lower extremities showed acute DVT in the right gastrocnemius veins as well as left peroneal veins. However Eliquis cannot be used as of now because of significant anemia. Discussed with IR about IVC filter.  Patient is a poor candidate for it.  Acute blood loss anemia Presented with hemoglobin of  5.6. Per history, patient was recently started on Eliquis after DVT following an ankle injury.  She has noticed intermittent melena since then but continued Eliquis. FOBT positive in the ED CT scan also showed diffuse retroperitoneal stranding and left pararenal standing suspicious for retroperitoneal hemorrhage. Currently Eliquis on hold.  Received 3 units of PRBC so far.  Hemoglobin stable above 9. Avoid further blood works.  Currently on clear liquid diet.  I ordered for regular diet today as preferred by patient and family. Recent Labs    11/22/22 0500 11/23/22 0305 11/24/22 0835 11/25/22 0217 11/26/22 0358  HGB 9.1* 7.0* 9.5* 9.5* 9.7*  MCV 91.5 89.8 92.0 92.2 96.4  FERRITIN  --  27  --   --   --   TIBC  --  298  --   --   --   IRON  --  18*  --   --   --   RETICCTPCT  --  3.0  --   --   --     Suspected aspiration pneumonia Acute respiratory failure with hypoxia EMS noted shortness of breath, wheezing given albuterol and Solu-Medrol. CT chest showed airspace disease in lingula. Suspect aspiration pneumonia related to confusion Dyspnea likely from combination of pneumonia and severe anemia. Currently on 2 L oxygen by nasal cannula. Continue to monitor.  Check ambulatory oxygen requirement Continue bronchodilators as needed  AKI Baseline creatinine less than 1.2.  Creatinine this morning was 1.47.  Related to sepsis. Recent Labs    06/22/22 1143 08/25/22 1157 09/30/22 0940 11/21/22 1758 11/22/22 0204 11/22/22 0500 11/23/22 0305 11/24/22  6440 11/25/22 0217 11/26/22 0358  BUN 28* 26* 21 28* 23 25* 28* 31* 26* 24*  CREATININE 1.15* 1.25* 1.10* 1.41* 1.10* 1.47* 1.39* 1.29* 1.14* 1.21*    Essential hypertension PTA on HCTZ 12.5 mg daily, losartan twice daily (50 mg a.m. and 25 mg p.m.) Currently BP meds on hold.  Recent ankle injury followed by DVT Eliquis on hold Pain control with Tylenol PT eval ordered  Anxiety/depression PTA on Xanax,  doxepin  Mobility: PT eval ordered.  Home with PT recommended it seems  Goals of care   Code Status: DNR. Palliative care consult appreciated..   DVT prophylaxis: Positive for bilateral lower EXTR DVT but currently unable to use blood thinners because of acute bleeding and anemia   Antimicrobials: Oral Duricef Fluid: Not on IV fluid Consultants: GI, PCCM, urology, oncology, Palliative care Family Communication: Son and daughter at bedside  Status is: Inpatient Level of care: Progressive   Dispo: Patient is from: Home            Anticipated d/c is to: Residential hospice if approved. Continue in-hospital care because: In process for authorization to residential hospice.  Scheduled Meds:  acetaminophen  1,000 mg Oral Q8H   budesonide  0.25 mg Nebulization BID   cefadroxil  500 mg Oral BID   doxepin  10 mg Oral QHS   [START ON 11/27/2022] ferrous sulfate  325 mg Oral Q breakfast   fluticasone  1 spray Each Nare BID   pantoprazole (PROTONIX) IV  40 mg Intravenous Q12H    PRN meds: ALPRAZolam, diphenhydrAMINE, EPINEPHrine, guaiFENesin, hydrALAZINE, levalbuterol, morphine injection, pseudoephedrine **AND** [DISCONTINUED] acetaminophen, zolpidem   Infusions:   ferric gluconate (FERRLECIT) IVPB 250 mg (11/26/22 0939)    Diet:  Diet Order             Diet regular Room service appropriate? Yes; Fluid consistency: Thin  Diet effective now                   Antimicrobials: Anti-infectives (From admission, onward)    Start     Dose/Rate Route Frequency Ordered Stop   11/25/22 1800  cefadroxil (DURICEF) capsule 500 mg        500 mg Oral 2 times daily 11/25/22 0946 12/05/22 0759   11/24/22 1600  amoxicillin (AMOXIL) capsule 500 mg        500 mg Oral  Once 11/24/22 1509 11/24/22 1603   11/23/22 1800  vancomycin (VANCOREADY) IVPB 750 mg/150 mL  Status:  Discontinued        750 mg 150 mL/hr over 60 Minutes Intravenous Every 48 hours 11/21/22 2038 11/22/22 1246   11/22/22  0200  aztreonam (AZACTAM) 1 g in sodium chloride 0.9 % 100 mL IVPB        1 g 200 mL/hr over 30 Minutes Intravenous Every 8 hours 11/21/22 2038 11/25/22 0948   11/21/22 1800  aztreonam (AZACTAM) 2 g in sodium chloride 0.9 % 100 mL IVPB        2 g 200 mL/hr over 30 Minutes Intravenous  Once 11/21/22 1755 11/21/22 1847   11/21/22 1800  metroNIDAZOLE (FLAGYL) IVPB 500 mg        500 mg 100 mL/hr over 60 Minutes Intravenous  Once 11/21/22 1755 11/21/22 1932   11/21/22 1800  vancomycin (VANCOCIN) IVPB 1000 mg/200 mL premix        1,000 mg 200 mL/hr over 60 Minutes Intravenous  Once 11/21/22 1755 11/21/22 1932       Skin  assessment:       Nutritional status:  Body mass index is 24.8 kg/m.          Objective: Vitals:   11/26/22 0811 11/26/22 0842  BP: 131/80   Pulse: 74   Resp: 20   Temp: 98.3 F (36.8 C)   SpO2: 95% 97%    Intake/Output Summary (Last 24 hours) at 11/26/2022 1004 Last data filed at 11/26/2022 0500 Gross per 24 hour  Intake 573.33 ml  Output 800 ml  Net -226.67 ml    Filed Weights   11/21/22 1743  Weight: 57.6 kg   Weight change:  Body mass index is 24.8 kg/m.   Physical Exam: General exam: Pleasant, elderly Caucasian female.  Not in distress.  Remains comfortable. Skin: No rashes, lesions or ulcers. HEENT: Atraumatic, normocephalic, no obvious bleeding Lungs: Diminished air entry in both bases, otherwise clear to auscultation bilaterally CVS: Regular rate and rhythm, no murmur GI/Abd soft, nondistended, nontender, bowel sound present, CNS: Alert, awake, oriented x 3.  Slow to respond.  Extremely hard of hearing Psychiatry: Mood appropriate. Extremities: Mild edema both legs.  Data Review: I have personally reviewed the laboratory data and studies available.  F/u labs  Unresulted Labs (From admission, onward)     Start     Ordered   11/21/22 2040  MRSA Next Gen by PCR, Nasal  (MRSA Screening)  Once,   URGENT        11/21/22 2039             Total time spent in review of labs and imaging, patient evaluation, formulation of plan, documentation and communication with family: 18 minutes  Signed, Terrilee Croak, MD [facility] 11/26/2022

## 2022-11-26 NOTE — Progress Notes (Signed)
Occupational Therapy Treatment Patient Details Name: Gloria [name] MRN: [id number] DOB: [birth year] Today's Date: 11/26/2022   History of present illness 89 y.o. female presents to [facility] hospital on 11/21/2022 with confusion, fever, dysuria and abdominal pain. CT scan demonstrates retroperitoneal stranding and worsening R hydronephrosis with a 45m proximal ureteral stone. Pt also noted to be anemic, concern for GIB. Pt underwent ureteral stent placement on 1/24. PMH includes GERD, HTN, UTI, anxiety, CAD.   OT comments  Patient received in supine and agreeable to OT session. Patient making good gains with bed mobility, standing at sink for grooming, and toilet transfers. Patient required cues for hand placement and safety with transfers. Patient to continue to be followed by acute OT to address standing ADL tasks and functional transfers.    Recommendations for follow up therapy are one component of a multi-disciplinary discharge planning process, led by the attending physician.  Recommendations may be updated based on patient status, additional functional criteria and insurance authorization.    Follow Up Recommendations  Other (comment) (SNF vs HH OT depending on progress)     Assistance Recommended at Discharge Frequent or constant Supervision/Assistance  Patient can return home with the following  Help with stairs or ramp for entrance;A little help with bathing/dressing/bathroom;A little help with walking and/or transfers;Direct supervision/assist for medications management;Direct supervision/assist for financial management;Assist for transportation;Assistance with cooking/housework   Equipment Recommendations  None recommended by OT    Recommendations for Other Services      Precautions / Restrictions Precautions Precautions: Fall Precaution Comments: monitor sats Restrictions Weight Bearing Restrictions: No       Mobility Bed Mobility Overal bed mobility: Needs Assistance Bed  Mobility: Rolling, Sidelying to Sit Rolling: Min guard Sidelying to sit: Min assist       General bed mobility comments: verbal cues for sequencing and rail use    Transfers Overall transfer level: Needs assistance Equipment used: Rolling walker (2 wheels) Transfers: Sit to/from Stand Sit to Stand: Min guard     Step pivot transfers: Min assist     General transfer comment: min assist for transfers due to assistance needed with RW and balance     Balance Overall balance assessment: Needs assistance Sitting-balance support: No upper extremity supported, Feet supported Sitting balance-Leahy Scale: Good     Standing balance support: Single extremity supported, Bilateral upper extremity supported, Reliant on assistive device for balance Standing balance-Leahy Scale: Poor Standing balance comment: reliant on external support when standing                           ADL either performed or assessed with clinical judgement   ADL Overall ADL's : Needs assistance/impaired     Grooming: Wash/dry hands;Wash/dry face;Brushing hair;Min guard;Standing Grooming Details (indicate cue type and reason): at sink with one seated rest break                 Toilet Transfer: Minimal assistance;Ambulation;BSC/3in1;Rolling walker (2 wheels) Toilet Transfer Details (indicate cue type and reason): [facility] with cues for hand placement                Extremity/Trunk Assessment              Vision       Perception     Praxis      Cognition Arousal/Alertness: Awake/alert Behavior During Therapy: Anxious Overall Cognitive Status: Within Functional Limits for tasks assessed Area of Impairment: Problem  solving, Memory                     Memory: Decreased short-term memory       Problem Solving: Slow processing, Decreased initiation, Requires verbal cues          Exercises      Shoulder Instructions        General Comments      Pertinent Vitals/ Pain       Pain Assessment Pain Assessment: Faces Faces Pain Scale: Hurts a little bit Pain Location: back Pain Descriptors / Indicators: Aching Pain Intervention(s): Limited activity within patient's tolerance, Monitored during session, Repositioned  Home Living                                          Prior Functioning/Environment              Frequency  Min 2X/week        Progress Toward Goals  OT Goals(current goals can now be found in the care plan section)  Progress towards OT goals: Progressing toward goals  Acute Rehab OT Goals Patient Stated Goal: get better OT Goal Formulation: With patient/family Time For Goal Achievement: 12/11/2022 ADL Goals Pt Will Perform Grooming: with supervision;standing Pt Will Perform Lower Body Dressing: with min assist;sit to/from stand Pt Will Transfer to Toilet: with supervision;ambulating;regular height toilet  Plan Discharge plan remains appropriate    Co-evaluation                 AM-PAC OT "6 Clicks" Daily Activity     Outcome Measure   Help from another person eating meals?: None Help from another person taking care of personal grooming?: A Little Help from another person toileting, which includes using toliet, bedpan, or urinal?: A Little Help from another person bathing (including washing, rinsing, drying)?: A Lot Help from another person to put on and taking off regular upper body clothing?: None Help from another person to put on and taking off regular lower body clothing?: A Lot 6 Click Score: 18    End of Session Equipment Utilized During Treatment: Gait belt;Rolling walker (2 wheels)  OT Visit Diagnosis: Unsteadiness on feet (R26.81);Muscle weakness (generalized) (M62.81)   Activity Tolerance Patient tolerated treatment well   Patient Left in chair;with call bell/phone within reach;with chair alarm set;with family/visitor present   Nurse  Communication Mobility status        Time: 9381-8299 OT Time Calculation (min): 34 min  Charges: OT General Charges $OT Visit: 1 Visit OT Treatments $Self Care/Home Management : 8-22 mins $Therapeutic Activity: 8-22 mins  Lodema Hong, [place]  Office [phone]   Trixie Dredge 11/26/2022, 1:48 PM

## 2022-11-26 NOTE — Progress Notes (Addendum)
Palliative Medicine Inpatient Follow Up Note HPI: Gloria [name] is a 89 y.o. female with PMH significant for HTN, nonobstructive CAD, left breast cancer, anxiety/depression who recently had ankle injury and subsequently developed DVT and was started on Eliquis.    Palliative care has been asked to get involved in the setting of concern for RP bleed in the setting met from breast ca. Plan to further address goals of care.   Today's Discussion 11/26/2022  *Please note that this is a verbal dictation therefore any spelling or grammatical errors are due to the "[employer]" system interpretation.  Chart reviewed inclusive of vital signs, progress notes, laboratory results, and diagnostic images.   I met this morning with [name], he daughter, [name], and her son, [name].   We discussed patients present state and the desire for hospice care. We reviewed the differences between in home hospice and inpatient hospice. I shared that it will be up to the hospice medical director to determine what path is best based upon patients symptom needs.  Patients daughter understands that [name] may not be accepted to hospice inpatient. She is prepared to take the patient home and see how it goes if needed. We reviewed that hospice in the home would provide DME. We discussed that life extending measures would be stopped and only symptom management would be pursued.   Patients daughters biggest concern is in regards to the level of care [name] may require long term. I shared often the hospice team will put a plan in place if someone worsens to transition to their inpatient sector.   Reviewed the concern of patients urinary bleeding and how patients daughter would like a purewick.   Reviewed the plan for [name] to speak to hospice of the [facility] liaison for further clarity of questions.   Plan at this time is to continue current care. Patients family would like patient to complete antibiotics if possible.    Questions and concerns addressed/Palliative Support Provided.   Objective Assessment: Vital Signs Vitals:   11/26/22 0842 11/26/22 1204  BP:  (!) 147/71  Pulse:    Resp:    Temp:  98.3 F (36.8 C)  SpO2: 97%     Intake/Output Summary (Last 24 hours) at 11/26/2022 1222 Last data filed at 11/26/2022 0500 Gross per 24 hour  Intake 273.33 ml  Output 800 ml  Net -526.67 ml    Last Weight  Most recent update: 11/21/2022  5:44 PM    Weight  57.6 kg (126 lb 15.8 oz)            Gen: Elderly Caucasian female in no acute distress HEENT: moist mucous membranes CV: Regular rate and rhythm  PULM: On room air breathing is even and unlabored ABD: soft/nontender EXT: No edema Neuro: Alert and oriented x3 -very hard of hearing  SUMMARY OF RECOMMENDATIONS   DNAR/DNI   Family would like patient to go on IP hospice if patient is not accepted inpatient they are willing to take her home --> Appreciate TOC helping to navigate this. Referral had been made to [facility]  Continue current scope of care until discharge  PMT for ongoing discussions regarding goals of care  Billing based on MDM: High ______________________________________________________________________________________ [place] Team Team Cell Phone: [phone] Please utilize secure chat with additional questions, if there is no response within 30 minutes please call the above phone number  Palliative Medicine Team providers are available by phone from 7am to 7pm daily  and can be reached through the team cell phone.  Should this patient require assistance outside of these hours, please call the patient's attending physician.

## 2022-11-26 NOTE — Progress Notes (Signed)
Pt has been reviewed by [facility] for [facility] in [place]. She does not qualify at this point as she is eating, drinking, ambulatory and does not have a symptom burden. I have had an extensive conversation with her daughter [name] about the different levels of Hospice Care. [name] is willing for patient to come home with Hospice support if she is not able to go to the [facility]. We will have a liaison to see patient in person tomorrow and meet with family at hospital to determine her final disposition.  Feel free to call our liaison anytime 8am-12MN at [phone]. Lin Landsman, RN BSN [facility].

## 2022-11-26 NOTE — Progress Notes (Signed)
Spoke to Enigma with [facility] who confirmed referral received and reports pt information being reviewed. TOC will provide updates as available.   Wandra Feinstein, MSW, LCSW [phone] (coverage)

## 2022-11-27 DIAGNOSIS — N3001 Acute cystitis with hematuria: Secondary | ICD-10-CM | POA: Diagnosis not present

## 2022-11-27 NOTE — Progress Notes (Signed)
PROGRESS NOTE  Gloria [name]  DOB: [birth year]  PCP: Gloria Ferretti, MD YHC:[id number]  DOA: 11/21/2022  LOS: 5 days  Hospital Day: 7  Brief narrative: Gloria [name] is a 89 y.o. female with PMH significant for HTN, nonobstructive CAD, left breast cancer, anxiety/depression who recently had ankle injury and subsequently developed DVT and was started on Eliquis.  This was complicated by intermittent melena but she remained on Eliquis. 1/23, patient's daughter took her to PCP with complaint of increased urinary frequency, dehydration and worsening confusion for 3 days.  She was sent home and suggested to increase hydration.  At home, her confusion worsened and EMS was called.  EMS noted her to have shortness of breath and wheezing all over.  En route, she received albuterol and Solu-Medrol.  In the ED, she had a fever of 102.9, heart rate in 130s, blood pressure 120s, required 2 L oxygen by nasal cannula She also complained of urinary urgency, frequency, dysuria and abdominal pain.  Labs showed elevated BNP at 129, troponin normal, WBC normal at 7.1, hemoglobin low at 5.6, platelet normal at 227, lactic acid elevated to 3.6, BUN/creatinine elevated to 28/1.41, INR 2.2 Respiratory virus panel negative for flu, RSV, COVID Chest x-ray unremarkable. FOBT positive Urinalysis showed yellow hazy color urine with moderate amount of blood, moderate leukocytes, positive nitrate and many bacteria  CT chest, abdomen pelvis showed 1. Diffuse inflammatory stranding surrounding the pancreas, duodenum, proximal small bowel, and descending colon. Findings are worrisome for acute pancreatitis. 2. Bladder wall thickening with surrounding inflammatory stranding worrisome for cystitis. 3. 2 mm calculus in the proximal right ureter with mild right-sided hydronephrosis. 4. Small amount of airspace disease in the lingula may represent atelectasis or infection. 6. New diffuse retroperitoneal stranding and left  pararenal stranding of uncertain etiology. Findings may be related to retroperitoneal hemorrhage, infectious/inflammatory process, or less likely retroperitoneal fibrosis.  Blood culture and urine culture were sent. Patient was started on broad-spectrum IV antibiotics. Patient was seen by urology. She underwent emergent cystoscopy, right ureteral stent placement. Postprocedure, she was also seen by PCCM.  She did not require transfer to ICU. Admitted to hospitalist service See below for details  MRI abdomen 1/24 showed 1. Increase in retroperitoneal soft tissue now encasing vascular structures and narrowing venous structures in the retroperitoneum and affecting LEFT perinephric space greater than RIGHT. Findings concerning for infiltrative breast cancer metastasis.   2. Marked LEFT-sided perinephric stranding with persistent longstanding hydronephrosis, persistent but improved right-sided hydronephrosis  3. Sludge in the gallbladder. No biliary duct dilation involving intrahepatic biliary tree. No choledocholithiasis. 4. While stranding is seen about the pancreas, but unlikely to be pancreatitis.     Subjective: Patient was seen and examined this morning.  Lying on bed comfortable.  Adequate sleep.  Son at bedside. Patient apparently did not qualify for residential hospice as family had hoped for. Preparation in process for discharge to home with hospice  Assessment/Plan: Severe sepsis secondary to UTI - POA Obstructive right ureterolithiasis with mild hydronephrosis Klebsiella pneumonia and blood culture and urine culture Presented with dysuria, frequency, dehydration, worsening confusion Urinalysis showed yellow hazy color urine with moderate amount of blood, moderate leukocytes, positive nitrate and many bacteria CT scan showed 2 mm proximal right ureteral calculus with mild right-sided hydronephrosis, bladder wall thickening. S/p emergent right ureteral stent placement  -1/23 Currently on IV aztreonam (Multiple allergies listed). Blood culture and urine culture sent on admission grew Klebsiella pneumoniae.  Discussed with pharmacist for oral  antibiotic choice.  Patient tolerated penicillin challenge test.  Antibiotics switched to oral Duricef. No recurrence of fever since stent placement.  WBC count normal.  Avoid further blood works Urology removed Foley catheter on 1/25. Recent Labs  Lab 11/21/22 1758 11/21/22 1948 11/22/22 0041 11/22/22 0500 11/23/22 0305 11/24/22 0512 11/24/22 0835 11/25/22 0217 11/26/22 0358  WBC 7.1  --   --  6.3 9.2  --  6.2 4.4 4.0  LATICACIDVEN 3.6* 5.7* 2.3*  --   --   --   --   --   --   PROCALCITON  --   --   --  26.72 22.77 17.83  --   --   --     Possible progressive metastatic breast cancer Patient has history of blood pressure cancer and follows up with [name]. MRI as above showed increasing retroperitoneal soft tissue encasing vascular structures as well as affecting left perinephric space greater than right.  Findings concerning for infiltrative breast cancer metastasis. It also showed peripancreatic stranding less likely to be acute pancreatitis. Per oncology, patient is not a candidate for any chemo or radiation.  Hospice being considered. Family wanted her to be started on Tylenol 1 g 3 times daily and Xanax for better pain control.  I ordered the same.  Bilateral lower extremity DVT 1/24, ultrasound duplex of lower extremities showed acute DVT in the right gastrocnemius veins as well as left peroneal veins. However Eliquis cannot be used as of now because of significant anemia. Discussed with IR about IVC filter.  Patient is a poor candidate for it.  Acute blood loss anemia Presented with hemoglobin of 5.6. Per history, patient was recently started on Eliquis after DVT following an ankle injury.  She has noticed intermittent melena since then but continued Eliquis. FOBT positive in the ED CT scan also  showed diffuse retroperitoneal stranding and left pararenal standing suspicious for retroperitoneal hemorrhage. Currently Eliquis on hold.  Received 3 units of PRBC so far.  Hemoglobin stable above 9. Avoid further blood works.  Currently on clear liquid diet.  I ordered for regular diet today as preferred by patient and family. Recent Labs    11/22/22 0500 11/23/22 0305 11/24/22 0835 11/25/22 0217 11/26/22 0358  HGB 9.1* 7.0* 9.5* 9.5* 9.7*  MCV 91.5 89.8 92.0 92.2 96.4  FERRITIN  --  27  --   --   --   TIBC  --  298  --   --   --   IRON  --  18*  --   --   --   RETICCTPCT  --  3.0  --   --   --     Suspected aspiration pneumonia Acute respiratory failure with hypoxia EMS noted shortness of breath, wheezing given albuterol and Solu-Medrol. CT chest showed airspace disease in lingula. Suspect aspiration pneumonia related to confusion Dyspnea likely from combination of pneumonia and severe anemia. Currently on 2 L oxygen by nasal cannula. Continue to monitor.  Check ambulatory oxygen requirement Continue bronchodilators as needed  AKI Baseline creatinine less than 1.2.  Creatinine this morning was 1.47.  Related to sepsis. Recent Labs    06/22/22 1143 08/25/22 1157 09/30/22 0940 11/21/22 1758 11/22/22 0204 11/22/22 0500 11/23/22 0305 11/24/22 0835 11/25/22 0217 11/26/22 0358  BUN 28* 26* 21 28* 23 25* 28* 31* 26* 24*  CREATININE 1.15* 1.25* 1.10* 1.41* 1.10* 1.47* 1.39* 1.29* 1.14* 1.21*    Essential hypertension PTA on HCTZ 12.5 mg daily, losartan twice  daily (50 mg a.m. and 25 mg p.m.) Currently BP meds on hold.  Recent ankle injury followed by DVT Eliquis on hold Pain control with Tylenol PT eval ordered  Anxiety/depression PTA on Xanax, doxepin  Mobility: PT eval ordered.  Home with PT recommended it seems  Goals of care   Code Status: DNR. Palliative care consult appreciated..   DVT prophylaxis: Positive for bilateral lower EXTR DVT but currently  unable to use blood thinners because of acute bleeding and anemia   Antimicrobials: Oral Duricef Fluid: Not on IV fluid Consultants: GI, PCCM, urology, oncology, Palliative care Family Communication: Son and daughter at bedside  Status is: Inpatient Level of care: Progressive   Dispo: Patient is from: Home            Anticipated d/c is to: Residential hospice was not approved.  Home hospice expected.  DME arrangements in process.  Scheduled Meds:  acetaminophen  1,000 mg Oral Q8H   budesonide  0.25 mg Nebulization BID   cefadroxil  500 mg Oral BID   doxepin  10 mg Oral QHS   ferrous sulfate  325 mg Oral Q breakfast   fluticasone  1 spray Each Nare BID   pantoprazole  40 mg Oral BID    PRN meds: ALPRAZolam, diphenhydrAMINE, EPINEPHrine, guaiFENesin, hydrALAZINE, levalbuterol, morphine injection, pseudoephedrine **AND** [DISCONTINUED] acetaminophen, zolpidem   Infusions:     Diet:  Diet Order             Diet regular Room service appropriate? Yes; Fluid consistency: Thin  Diet effective now                   Antimicrobials: Anti-infectives (From admission, onward)    Start     Dose/Rate Route Frequency Ordered Stop   11/25/22 1800  cefadroxil (DURICEF) capsule 500 mg        500 mg Oral 2 times daily 11/25/22 0946 12/05/22 0759   11/24/22 1600  amoxicillin (AMOXIL) capsule 500 mg        500 mg Oral  Once 11/24/22 1509 11/24/22 1603   11/23/22 1800  vancomycin (VANCOREADY) IVPB 750 mg/150 mL  Status:  Discontinued        750 mg 150 mL/hr over 60 Minutes Intravenous Every 48 hours 11/21/22 2038 11/22/22 1246   11/22/22 0200  aztreonam (AZACTAM) 1 g in sodium chloride 0.9 % 100 mL IVPB        1 g 200 mL/hr over 30 Minutes Intravenous Every 8 hours 11/21/22 2038 11/25/22 0948   11/21/22 1800  aztreonam (AZACTAM) 2 g in sodium chloride 0.9 % 100 mL IVPB        2 g 200 mL/hr over 30 Minutes Intravenous  Once 11/21/22 1755 11/21/22 1847   11/21/22 1800  metroNIDAZOLE  (FLAGYL) IVPB 500 mg        500 mg 100 mL/hr over 60 Minutes Intravenous  Once 11/21/22 1755 11/21/22 1932   11/21/22 1800  vancomycin (VANCOCIN) IVPB 1000 mg/200 mL premix        1,000 mg 200 mL/hr over 60 Minutes Intravenous  Once 11/21/22 1755 11/21/22 1932       Skin assessment:       Nutritional status:  Body mass index is 24.8 kg/m.          Objective: Vitals:   11/27/22 0302 11/27/22 0833  BP: 124/64 127/66  Pulse: 75 70  Resp: 18 20  Temp:    SpO2: 92% 94%    Intake/Output  Summary (Last 24 hours) at 11/27/2022 1312 Last data filed at 11/26/2022 2328 Gross per 24 hour  Intake --  Output 400 ml  Net -400 ml    Filed Weights   11/21/22 1743  Weight: 57.6 kg   Weight change:  Body mass index is 24.8 kg/m.   Physical Exam: General exam: Pleasant, elderly Caucasian female.  Not in distress.  Remains comfortable. Skin: No rashes, lesions or ulcers. HEENT: Atraumatic, normocephalic, no obvious bleeding Lungs: Diminished air entry in both bases, otherwise clear to auscultation bilaterally CVS: Regular rate and rhythm, no murmur GI/Abd soft, nondistended, nontender, bowel sound present, CNS: Alert, awake, oriented x 3.  Slow to respond.  Extremely hard of hearing Psychiatry: Mood appropriate. Extremities: Mild edema both legs.  Data Review: I have personally reviewed the laboratory data and studies available.  F/u labs  Unresulted Labs (From admission, onward)     Start     Ordered   11/21/22 2040  MRSA Next Gen by PCR, Nasal  (MRSA Screening)  Once,   URGENT        11/21/22 2039            Total time spent in review of labs and imaging, patient evaluation, formulation of plan, documentation and communication with family: 25 minutes  Signed, Terrilee Croak, MD [facility] 11/27/2022

## 2022-11-27 NOTE — Progress Notes (Signed)
Physical Therapy Treatment Patient Details Name: Gloria [name] MRN: [id number] DOB: 1927-11-18 Today's Date: 11/27/2022   History of Present Illness 89 y.o. female presents to [facility] hospital on 11/21/2022 with confusion, fever, dysuria and abdominal pain. CT scan demonstrates retroperitoneal stranding and worsening R hydronephrosis with a 1m proximal ureteral stone. Pt also noted to be anemic, concern for GIB. Pt underwent ureteral stent placement on 1/24. PMH includes GERD, HTN, UTI, anxiety, CAD.    PT Comments    Granddaughter present. Pt agreeable to try to ambulate and get up in chair. Pt requiring increased assist this date with transfer to EOB. Noted bilat LE edema. Pt did ambulate this date with RW. Pt to benefit from youth RW to best support pt during ambulation. Acute PT to cont to follow per patient wishes or until POC changes.    Recommendations for follow up therapy are one component of a multi-disciplinary discharge planning process, led by the attending physician.  Recommendations may be updated based on patient status, additional functional criteria and insurance authorization.  Follow Up Recommendations  Home health PT (vs inpatient hospice vs hospice at home)     Assistance Recommended at Discharge Frequent or constant Supervision/Assistance  Patient can return home with the following A lot of help with walking and/or transfers;A lot of help with bathing/dressing/bathroom;Assistance with cooking/housework;Direct supervision/assist for medications management;Direct supervision/assist for financial management;Assist for transportation;Help with stairs or ramp for entrance   Equipment Recommendations  Wheelchair (measurements PT);Wheelchair cushion (measurements PT)    Recommendations for Other Services       Precautions / Restrictions Precautions Precautions: Fall Precaution Comments: monitor sats Restrictions Weight Bearing Restrictions: No     Mobility  Bed  Mobility Overal bed mobility: Needs Assistance Bed Mobility: Supine to Sit     Supine to sit: HOB elevated, Mod assist     General bed mobility comments: pt initiated LE movement to EOB and bringing trunk up but ultimately required modA for trunk elevation and to bring hips to EOB and feet on the floor    Transfers Overall transfer level: Needs assistance Equipment used: Rolling walker (2 wheels) Transfers: Sit to/from Stand Sit to Stand: Min assist           General transfer comment: minA to power up and stedy during transition of hands    Ambulation/Gait Ambulation/Gait assistance: Min assist Gait Distance (Feet): 3 Feet (x1, 12x1) Assistive device: Rolling walker (2 wheels) Gait Pattern/deviations: Step-to pattern, Trunk flexed Gait velocity: reduced     General Gait Details: slowed 1/2 step-through gait, increased trunk flexion, quick onset of fatigue, pt supported at trunk underarm to aide in stability   Stairs             Wheelchair Mobility    Modified Rankin (Stroke Patients Only)       Balance Overall balance assessment: Needs assistance Sitting-balance support: No upper extremity supported, Feet supported Sitting balance-Leahy Scale: Good     Standing balance support: Single extremity supported, Bilateral upper extremity supported, Reliant on assistive device for balance Standing balance-Leahy Scale: Poor Standing balance comment: reliant on external support when standing, pt did stand for 2 min with minA while pt urinated in [place]                            Cognition Arousal/Alertness: Awake/alert Behavior During Therapy: WFL for tasks assessed/performed Overall Cognitive Status: Within Functional Limits for tasks assessed Area of Impairment: Problem  solving, Memory                     Memory: Decreased short-term memory         General Comments: pt with noted HOH which most likely contributes to her delayed  response time. Pt pleasant and appreciative to get up and walk        Exercises      General Comments General comments (skin integrity, edema, etc.): VSS      Pertinent Vitals/Pain Pain Assessment Pain Assessment: Faces Faces Pain Scale: Hurts little more Pain Location: back Pain Descriptors / Indicators: Aching Pain Intervention(s): Monitored during session    Home Living                          Prior Function            PT Goals (current goals can now be found in the care plan section) Acute Rehab PT Goals PT Goal Formulation: With patient/family Time For Goal Achievement: 12/06/22 Potential to Achieve Goals: Fair Progress towards PT goals: Progressing toward goals    Frequency    Min 3X/week      PT Plan Current plan remains appropriate    Co-evaluation              AM-PAC PT "6 Clicks" Mobility   Outcome Measure  Help needed turning from your back to your side while in a flat bed without using bedrails?: A Lot Help needed moving from lying on your back to sitting on the side of a flat bed without using bedrails?: A Lot Help needed moving to and from a bed to a chair (including a wheelchair)?: A Lot Help needed standing up from a chair using your arms (e.g., wheelchair or bedside chair)?: A Lot Help needed to walk in hospital room?: A Lot Help needed climbing 3-5 steps with a railing? : Total 6 Click Score: 11    End of Session   Activity Tolerance: Patient limited by fatigue Patient left: in chair;with call bell/phone within reach;with family/visitor present Nurse Communication: Mobility status PT Visit Diagnosis: Other abnormalities of gait and mobility (R26.89);Muscle weakness (generalized) (M62.81)     Time: 1607-3710 PT Time Calculation (min) (ACUTE ONLY): 32 min  Charges:  $Gait Training: 8-22 mins $Therapeutic Activity: 8-22 mins                     Gloria [name], PT, DPT Acute Rehabilitation Services Secure chat  preferred Office #: [phone]    Gloria [name] 11/27/2022, 11:51 AM

## 2022-11-27 NOTE — Telephone Encounter (Signed)
Acknowledged.

## 2022-11-28 ENCOUNTER — Telehealth: Payer: Self-pay

## 2022-11-28 DIAGNOSIS — Z7189 Other specified counseling: Secondary | ICD-10-CM | POA: Diagnosis not present

## 2022-11-28 DIAGNOSIS — Z515 Encounter for palliative care: Secondary | ICD-10-CM | POA: Diagnosis not present

## 2022-11-28 DIAGNOSIS — C50919 Malignant neoplasm of unspecified site of unspecified female breast: Secondary | ICD-10-CM

## 2022-11-28 DIAGNOSIS — N3001 Acute cystitis with hematuria: Secondary | ICD-10-CM | POA: Diagnosis not present

## 2022-11-28 MED ORDER — ALPRAZOLAM 0.25 MG PO TABS
0.2500 mg | ORAL_TABLET | Freq: Three times a day (TID) | ORAL | Status: DC
  Administered 2022-11-28 – 2022-11-29 (×3): 0.25 mg via ORAL
  Filled 2022-11-28 (×3): qty 1

## 2022-11-28 MED ORDER — ACETAMINOPHEN 500 MG PO TABS
1000.0000 mg | ORAL_TABLET | Freq: Three times a day (TID) | ORAL | Status: DC
  Administered 2022-11-28 – 2022-11-29 (×3): 1000 mg via ORAL
  Filled 2022-11-28 (×3): qty 2

## 2022-11-28 MED ORDER — ZOLPIDEM TARTRATE 5 MG PO TABS: 5.0000 mg | ORAL_TABLET | Freq: Every evening | ORAL | 0 refills | Status: DC | PRN

## 2022-11-28 MED ORDER — MORPHINE SULFATE (CONCENTRATE) 10 MG/0.5ML PO SOLN: 2.5000 mg | ORAL | Status: DC | PRN

## 2022-11-28 MED ORDER — CEFADROXIL 500 MG PO CAPS: 500.0000 mg | ORAL_CAPSULE | Freq: Two times a day (BID) | ORAL | 0 refills | Status: AC

## 2022-11-28 MED ORDER — SENNA 8.6 MG PO TABS: 1.0000 | ORAL_TABLET | Freq: Two times a day (BID) | ORAL | Status: DC | PRN

## 2022-11-28 MED ORDER — GUAIFENESIN ER 600 MG PO TB12: 600.0000 mg | ORAL_TABLET | Freq: Two times a day (BID) | ORAL | 0 refills | Status: AC

## 2022-11-28 MED ORDER — MORPHINE SULFATE (CONCENTRATE) 10 MG/0.5ML PO SOLN: 2.5000 mg | ORAL | 0 refills | Status: DC | PRN

## 2022-11-28 MED ORDER — PANTOPRAZOLE SODIUM 40 MG PO TBEC: 40.0000 mg | DELAYED_RELEASE_TABLET | Freq: Two times a day (BID) | ORAL | 0 refills | Status: AC

## 2022-11-28 MED ORDER — SENNA 8.6 MG PO TABS: 1.0000 | ORAL_TABLET | Freq: Every day | ORAL | Status: DC | PRN

## 2022-11-28 MED ORDER — SENNA 8.6 MG PO TABS
1.0000 | ORAL_TABLET | Freq: Every day | ORAL | Status: DC
  Administered 2022-11-28: 8.6 mg via ORAL
  Filled 2022-11-28: qty 1

## 2022-11-28 MED ORDER — ACETAMINOPHEN 325 MG PO TABS
650.0000 mg | ORAL_TABLET | Freq: Once | ORAL | Status: AC
  Administered 2022-11-28: 650 mg via ORAL
  Filled 2022-11-28: qty 2

## 2022-11-28 MED ORDER — FERROUS SULFATE 325 (65 FE) MG PO TABS: 325.0000 mg | ORAL_TABLET | Freq: Every day | ORAL | 0 refills | Status: AC

## 2022-11-28 MED ORDER — MORPHINE SULFATE (CONCENTRATE) 10 MG/0.5ML PO SOLN: 2.5000 mg | Freq: Three times a day (TID) | ORAL | 0 refills | Status: AC | PRN

## 2022-11-28 MED ORDER — ALPRAZOLAM 0.25 MG PO TABS: 0.2500 mg | ORAL_TABLET | Freq: Three times a day (TID) | ORAL | 0 refills | Status: AC

## 2022-11-28 NOTE — Plan of Care (Signed)
  Problem: Education: Goal: Knowledge of General Education information will improve Description Including pain rating scale, medication(s)/side effects and non-pharmacologic comfort measures Outcome: Progressing   

## 2022-11-28 NOTE — TOC Progression Note (Signed)
Transition of Care (TOC) - Progression Note  Marvetta Gibbons RN,BSN Transitions of Care Unit 4NP (Non Trauma)- RN Case Manager See Treatment Team for direct Phone #   Patient Details  Name: Gloria [name] MRN: [id number] Date of Birth: 03/08/1928  Transition of Care [facility]) CM/SW Contact  Dahlia Client, Romeo Rabon, RN Phone Number: 11/28/2022, 12:55 PM  Clinical Narrative:    Notified by [place] with [facility] this am that DME has been ordered and awaiting delivery today for home 02, hospital bed, wheelchair and BSC.  Spoke with sonJosph Macho at bedside to answer questions that he had regarding Home Hospice needs and transition home with Hospice Services.  Pt will transport home via EMS once DME has been confirmed delivered. Explained to son that should DME be delivered early enough today MD has indicated that pt may still be able to discharge and Hospice as voiced the same, otherwise if timing is later in the day we will hold off and discharge tomorrow.   PC liaison also stopped by during discussion to support any further questions that son had at this time.  TOC will remain available for family should they have any further questions.  [facility] has been speaking with the daughter [name].   TOC will arrange transport once DME delivered.  GOLD DNR is confirmed on chart.  Pt pending home w/ Hospice.    Expected Discharge Plan: Home w Hospice Care Barriers to Discharge: Equipment Delay  Expected Discharge Plan and Services In-house Referral: Clinical Social Work Discharge Planning Services: CM Consult Post Acute Care Choice: Hospice Living arrangements for the past 2 months: Single Family Home Expected Discharge Date: 11/28/22               DME Arranged: Hospice Equipment Package Others DME Agency: [employer]     Representative spoke with at DME Agency: Per [place] Date [place]:  11/27/22 Time [place]: 45 Representative spoke with at [place]: [place] Determinants of Health ([place]) Interventions SDOH Screenings   Tobacco Use: Medium Risk (11/24/2022)    Readmission Risk Interventions     No data to display

## 2022-11-28 NOTE — Progress Notes (Addendum)
PROGRESS NOTE  Gloria [name]  DOB: 04-15-1928  PCP: Charlane Ferretti, MD [facility]:[id number]  DOA: 11/21/2022  LOS: 6 days  Hospital Day: 8  Brief narrative: Gloria [name] is a 89 y.o. female with PMH significant for HTN, nonobstructive CAD, left breast cancer, anxiety/depression who recently had ankle injury and subsequently developed DVT and was started on Eliquis.  This was complicated by intermittent melena but she remained on Eliquis. 1/23, patient's daughter took her to PCP with complaint of increased urinary frequency, dehydration and worsening confusion for 3 days.  She was sent home and suggested to increase hydration.  At home, her confusion worsened and EMS was called.  EMS noted her to have shortness of breath and wheezing all over.  En route, she received albuterol and Solu-Medrol.  In the ED, patient was febrile, lactic acid level was elevated FOBT positive Urinalysis showed yellow hazy color urine with moderate amount of blood, moderate leukocytes, positive nitrate and many bacteria  CT chest, abdomen pelvis showed 1. Diffuse inflammatory stranding surrounding the pancreas, duodenum, proximal small bowel, and descending colon. Findings are worrisome for acute pancreatitis. 2. Bladder wall thickening with surrounding inflammatory stranding worrisome for cystitis. 3. 2 mm calculus in the proximal right ureter with mild right-sided hydronephrosis. 4. Small amount of airspace disease in the lingula may represent atelectasis or infection. 6. New diffuse retroperitoneal stranding and left pararenal stranding of uncertain etiology. Findings may be related to retroperitoneal hemorrhage, infectious/inflammatory process, or less likely retroperitoneal fibrosis.  Blood culture and urine culture were sent. Patient was started on broad-spectrum IV antibiotics. Patient was seen by urology. She underwent emergent cystoscopy, right ureteral stent placement. Postprocedure, she was admitted to  hospitalist service  MRI abdomen 1/24 showed 1. Increase in retroperitoneal soft tissue now encasing vascular structures and narrowing venous structures in the retroperitoneum and affecting LEFT perinephric space greater than RIGHT. Findings concerning for infiltrative breast cancer metastasis.   2. Marked LEFT-sided perinephric stranding with persistent longstanding hydronephrosis, persistent but improved right-sided hydronephrosis  3. Sludge in the gallbladder. No biliary duct dilation involving intrahepatic biliary tree. No choledocholithiasis. 4. While stranding is seen about the pancreas, but unlikely to be pancreatitis.     Because of significant findings of cancer progression in the imagings, oncology consultation was obtained. Patient was deemed to be not a candidate for any further intervention. Palliative care consultation was obtained. Patient and family made a decision to not pursue any aggressive care but wanted to continue nonaggressive medical management while in the hospital Currently not on comfort care status but plan is to discharge to hospice status.  Family had hoped for residential hospice but she did not meet criteria for that. Arrangements in process for home hospice.  Subjective: Patient was seen and examined this morning.  Lying on bed comfortable.  Family had questions about inadequate control of pain and anxiety at night.  Medicines adjusted.  Assessment/Plan: Severe sepsis secondary to UTI - POA Obstructive right ureterolithiasis with mild hydronephrosis Klebsiella pneumonia and blood culture and urine culture Presented with dysuria, frequency, dehydration, worsening confusion Urinalysis showed yellow hazy color urine with moderate amount of blood, moderate leukocytes, positive nitrate and many bacteria CT scan showed 2 mm proximal right ureteral calculus with mild right-sided hydronephrosis, bladder wall thickening. S/p emergent right ureteral stent placement  -1/23 Blood culture and urine culture sent on admission grew Klebsiella pneumoniae.   Initially treated with IV aztreonam.  Later switched to oral Duricef. No recurrence of fever since  stent placement.  WBC count normal.  Avoid further blood works Urology removed Foley catheter on 1/25. Recent Labs  Lab 11/21/22 1758 11/21/22 1948 11/22/22 0041 11/22/22 0500 11/23/22 0305 11/24/22 0512 11/24/22 0835 11/25/22 0217 11/26/22 0358  WBC 7.1  --   --  6.3 9.2  --  6.2 4.4 4.0  LATICACIDVEN 3.6* 5.7* 2.3*  --   --   --   --   --   --   PROCALCITON  --   --   --  26.72 22.77 17.83  --   --   --    Possible progressive metastatic breast cancer Patient has history of blood pressure cancer and follows up with [name]. MRI as above showed increasing retroperitoneal soft tissue encasing vascular structures as well as affecting left perinephric space greater than right.  Findings concerning for infiltrative breast cancer metastasis. Per oncology, patient is not a candidate for any chemo or radiation.   Disposition: Home hospice as discussed above. For better control of pain and anxiety, patient is on  scheduled Tylenol 1 g 3 times daily scheduled Xanax 0.25 mg twice daily As needed morphine sublingual every 2 hours As needed IV morphine  Bilateral lower extremity DVT 1/24, ultrasound duplex of lower extremities showed acute DVT in the right gastrocnemius veins as well as left peroneal veins. However Eliquis cannot be used as of now because of significant anemia. Discussed with IR about IVC filter.  Patient is a poor candidate for it.  Acute blood loss anemia Presented with hemoglobin of 5.6. Per history, patient was recently started on Eliquis after DVT following an ankle injury.  She has noticed intermittent melena since then but continued Eliquis. FOBT positive in the ED.  Eliquis was stopped CT scan also showed diffuse retroperitoneal stranding and left pararenal standing suspicious for  retroperitoneal hemorrhage. Received 3 units of PRBC so far.  Hemoglobin stable above 9 on last check.  On regular diet per patient's request. Recent Labs    11/22/22 0500 11/23/22 0305 11/24/22 0835 11/25/22 0217 11/26/22 0358  HGB 9.1* 7.0* 9.5* 9.5* 9.7*  MCV 91.5 89.8 92.0 92.2 96.4  FERRITIN  --  27  --   --   --   TIBC  --  298  --   --   --   IRON  --  18*  --   --   --   RETICCTPCT  --  3.0  --   --   --    Aspiration pneumonia Acute respiratory failure with hypoxia EMS noted shortness of breath, wheezing given albuterol and Solu-Medrol. CT chest showed airspace disease in lingula. Patient likely had aspiration pneumonia related to confusion Dyspnea likely from combination of pneumonia and severe anemia. Initially required supplemental oxygen..  Currently breathing on room air  AKI Baseline creatinine less than 1.2.  Creatinine this morning was 1.47.  Related to sepsis. No longer monitoring creatinine.   Essential hypertension PTA on HCTZ 12.5 mg daily, losartan twice daily (50 mg a.m. and 25 mg p.m.) Currently BP meds on hold.  Recent ankle injury followed by DVT Eliquis on hold Pain control with Tylenol PT eval ordered  Anxiety/depression PTA on Xanax, doxepin  Goals of care   Code Status: DNR. Palliative care consult appreciated.   DVT prophylaxis: Positive for bilateral lower EXTR DVT but currently unable to use blood thinners because of acute bleeding and anemia   Antimicrobials: Oral Duricef Fluid: Not on IV fluid Consultants: GI, PCCM, urology,  oncology, Palliative care Family Communication: Son and daughter at bedside  Status is: Inpatient Level of care: Progressive   Dispo: Patient is from: Home            Anticipated d/c is to: Residential hospice was not approved.  Home hospice expected.  DME arrangements in process.  Scheduled Meds:  acetaminophen  1,000 mg Oral Q8H   acetaminophen  1,000 mg Oral Q8H   ALPRAZolam  0.25 mg Oral TID    budesonide  0.25 mg Nebulization BID   cefadroxil  500 mg Oral BID   doxepin  10 mg Oral QHS   ferrous sulfate  325 mg Oral Q breakfast   fluticasone  1 spray Each Nare BID   pantoprazole  40 mg Oral BID    PRN meds: diphenhydrAMINE, EPINEPHrine, guaiFENesin, hydrALAZINE, levalbuterol, morphine injection, morphine CONCENTRATE, pseudoephedrine **AND** [DISCONTINUED] acetaminophen, zolpidem   Infusions:     Diet:  Diet Order             Diet general           Diet regular Room service appropriate? Yes; Fluid consistency: Thin  Diet effective now                   Antimicrobials: Anti-infectives (From admission, onward)    Start     Dose/Rate Route Frequency Ordered Stop   11/28/22 0000  cefadroxil (DURICEF) 500 MG capsule        500 mg Oral 2 times daily 11/28/22 1023 12/05/22 2359   11/25/22 1800  cefadroxil (DURICEF) capsule 500 mg        500 mg Oral 2 times daily 11/25/22 0946 12/05/22 0759   11/24/22 1600  amoxicillin (AMOXIL) capsule 500 mg        500 mg Oral  Once 11/24/22 1509 11/24/22 1603   11/23/22 1800  vancomycin (VANCOREADY) IVPB 750 mg/150 mL  Status:  Discontinued        750 mg 150 mL/hr over 60 Minutes Intravenous Every 48 hours 11/21/22 2038 11/22/22 1246   11/22/22 0200  aztreonam (AZACTAM) 1 g in sodium chloride 0.9 % 100 mL IVPB        1 g 200 mL/hr over 30 Minutes Intravenous Every 8 hours 11/21/22 2038 11/25/22 0948   11/21/22 1800  aztreonam (AZACTAM) 2 g in sodium chloride 0.9 % 100 mL IVPB        2 g 200 mL/hr over 30 Minutes Intravenous  Once 11/21/22 1755 11/21/22 1847   11/21/22 1800  metroNIDAZOLE (FLAGYL) IVPB 500 mg        500 mg 100 mL/hr over 60 Minutes Intravenous  Once 11/21/22 1755 11/21/22 1932   11/21/22 1800  vancomycin (VANCOCIN) IVPB 1000 mg/200 mL premix        1,000 mg 200 mL/hr over 60 Minutes Intravenous  Once 11/21/22 1755 11/21/22 1932       Skin assessment:       Nutritional status:  Body mass index is 24.8  kg/m.          Objective: Vitals:   11/28/22 0343 11/28/22 0743  BP: (!) 149/74 138/77  Pulse: 74 77  Resp: 17 18  Temp: (!) 97.5 F (36.4 C) (!) 97.5 F (36.4 C)  SpO2: 93% 94%   No intake or output data in the 24 hours ending 11/28/22 1023  Filed Weights   11/21/22 1743  Weight: 57.6 kg   Weight change:  Body mass index is 24.8 kg/m.  Physical Exam: General exam: Pleasant, elderly Caucasian female.  Not in distress.  Remains comfortable. Skin: No rashes, lesions or ulcers. HEENT: Atraumatic, normocephalic, no obvious bleeding Lungs: Diminished air entry in both bases, otherwise clear to auscultation bilaterally CVS: Regular rate and rhythm, no murmur GI/Abd soft, nondistended, nontender, bowel sound present, CNS: Alert, awake, oriented x 3.  Slow to respond.  Extremely hard of hearing Psychiatry: Mood appropriate. Extremities: Mild edema both legs.  Data Review: I have personally reviewed the laboratory data and studies available.  F/u labs  Unresulted Labs (From admission, onward)    None       Total time spent in review of labs and imaging, patient evaluation, formulation of plan, documentation and communication with family: 66 minutes  Signed, Terrilee Croak, MD [facility] 11/28/2022

## 2022-11-28 NOTE — Telephone Encounter (Signed)
Spoke with Ebony Hail with [facility] Admissions Team regarding pt's family wanting pt go to [facility].  Informed Ebony Hail that pt's daughter [name] stated that pt is going to pt d/c on home Hospice which they do not want and were told that pt does not meet the inpt criteria.  Ebony Hail stated that Renae Gloss is assigned to this case and she will make [facility] aware of the pt's concerns.  Ebony Hail stated that Renae Gloss will contact the pt's family to address their questions and concerns.

## 2022-11-28 NOTE — Progress Notes (Signed)
Palliative:  HPI: 89 y.o. female with PMH significant for HTN, nonobstructive CAD, left breast cancer, anxiety/depression who recently had ankle injury and subsequently developed DVT and was started on Eliquis. Palliative care has been asked to get involved in the setting of concern for RP bleed in the setting met from breast ca. Plan to further address goals of care.   I was called to Gloria [name]'s bedside by RN noting that family wished to speak with someone from palliative care. I met today with Gloria [name], son Gloria [name], granddaughter Gloria [name], and [facility] Gloria [name] at bedside. Gloria [name] mostly rested during my visit. She complaints of achy pain in her abd - Gloria [name] notes that this happens after she eats anything. Family reports that she is only eating a few bites here and there. Gloria [name] had concerns about plan forward. We reviewed with him the process of home with hospice, equipment delivery, transport, and coordination with hospice RN to admit once at home. We discussed that there are variables to timing of when all this will occur. I acknowledged his frustration with coordination and communication but reassured that we are working together to get her home and try and make this move as smooth as possible. We discussed increased pain medication to be provided to assist her during increased activity of the transfer. Will also add medication for constipation.   All questions/concerns addressed to the best of my ability. Emotional support provided.   Exam: Sleepy, fatigued. Oriented. No distress. Breathing regular, unlabored. Abd soft.   Plan: - Plans in motion for home with hospice.  - Agree with symptom regimen ordered by Gloria [name].  - Administer IV morphine prior to transfer to home.  - Senokot ordered daily and extra PRN for constipation.   25 min  Gloria Sill, NP Palliative Medicine Team Pager [phone] (Please see [email or web address] for schedule) Team Phone [phone]    Greater than 50%  of this time  was spent counseling and coordinating care related to the above assessment and plan

## 2022-11-28 NOTE — Progress Notes (Signed)
Occupational Therapy Treatment Patient Details Name: Gloria [name] MRN: [id number] DOB: [birth year] Today's Date: 11/28/2022   History of present illness 89 y.o. female presents to [place] on 11/21/2022 with confusion, fever, dysuria and abdominal pain. CT scan demonstrates retroperitoneal stranding and worsening R hydronephrosis with a 6m proximal ureteral stone. Pt also noted to be anemic, concern for GIB. Pt underwent ureteral stent placement on 1/24. PMH includes GERD, HTN, UTI, anxiety, CAD.   OT comments  Patient with limited session this date.  Patient initially wanting to get to recliner to eat am meal, but deferred due to difficult night of sleep, and wanting to rest.  Granddaughter in the room, and patient assisted to semi chair position t eat.  Granddaughter providing assist as needed.  Plan appears to return home with assist from family and palliative care.  OT can continue to follow, goals of care reviewed, and adjusted to reflect current status.     Recommendations for follow up therapy are one component of a multi-disciplinary discharge planning process, led by the attending physician.  Recommendations may be updated based on patient status, additional functional criteria and insurance authorization.    Follow Up Recommendations  Other (comment) HH OT if family is interested versus SNF level rehab.      Assistance Recommended at Discharge Frequent or constant Supervision/Assistance  Patient can return home with the following  Help with stairs or ramp for entrance;A little help with bathing/dressing/bathroom;A little help with walking and/or transfers;Direct supervision/assist for medications management;Direct supervision/assist for financial management;Assist for transportation;Assistance with cooking/housework   Equipment Recommendations  None recommended by OT    Recommendations for Other Services      Precautions / Restrictions Precautions Precautions:  Fall Restrictions Weight Bearing Restrictions: No       Mobility Bed Mobility Overal bed mobility: Needs Assistance Bed Mobility: Supine to Sit, Sit to Supine   Sidelying to sit: Min assist, Mod assist Supine to sit: Mod assist          Transfers                   General transfer comment: deferred due to poor evening sleep     Balance Overall balance assessment: Needs assistance Sitting-balance support: No upper extremity supported, Feet supported Sitting balance-Leahy Scale: Fair                                     ADL either performed or assessed with clinical judgement   ADL                                              Extremity/Trunk Assessment Upper Extremity Assessment Upper Extremity Assessment: Generalized weakness   Lower Extremity Assessment Lower Extremity Assessment: Defer to PT evaluation   Cervical / Trunk Assessment Cervical / Trunk Assessment: Kyphotic                      Cognition Arousal/Alertness: Lethargic Behavior During Therapy: WFL for tasks assessed/performed                                 Problem Solving: Slow processing, Decreased initiation, Requires verbal cues General Comments: HOH  Pertinent Vitals/ Pain       Pain Assessment Pain Assessment: Faces Faces Pain Scale: Hurts little more Pain Location: back Pain Descriptors / Indicators: Aching Pain Intervention(s): Monitored during session                                                          Frequency  Min 2X/week        Progress Toward Goals  OT Goals(current goals can now be found in the care plan section)     Acute Rehab OT Goals Patient Stated Goal: None stated OT Goal Formulation: With patient Time For Goal Achievement: 12/14/22 ADL Goals Pt Will Perform Grooming: with supervision;sitting;standing  Plan Discharge plan remains  appropriate    Co-evaluation                 AM-PAC OT "6 Clicks" Daily Activity     Outcome Measure   Help from another person eating meals?: A Little Help from another person taking care of personal grooming?: A Little Help from another person toileting, which includes using toliet, bedpan, or urinal?: A Lot Help from another person bathing (including washing, rinsing, drying)?: A Lot Help from another person to put on and taking off regular upper body clothing?: A Little Help from another person to put on and taking off regular lower body clothing?: A Lot 6 Click Score: 15    End of Session    OT Visit Diagnosis: Unsteadiness on feet (R26.81);Muscle weakness (generalized) (M62.81)   Activity Tolerance Patient limited by lethargy   Patient Left in bed;with call bell/phone within reach;with family/visitor present   Nurse Communication Mobility status        Time: 2706-2376 OT Time Calculation (min): 10 min  Charges: OT General Charges $OT Visit: 1 Visit OT Treatments $Therapeutic Activity: 8-22 mins  11/28/2022  RP, OTR/L  Acute Rehabilitation Services  Office:  [phone]   Metta Clines 11/28/2022, 10:03 AM

## 2022-11-28 NOTE — Progress Notes (Signed)
Gloria [name]   DOB:06-07-1984   XI#:[id number]   [id number]#:[id number]  Oncology follow up   Subjective: Patient complains headaches this morning.  Her son and grand daughter was at the bedside, and concerned about she was not getting enough medication for her anxiety and pain.  Patient and her family have decided to go home with hospice.  Objective:  Vitals:   11/28/22 1938 11/28/22 2023  BP:    Pulse:    Resp:    Temp: 98.4 F (36.9 C)   SpO2:  96%    Body mass index is 24.8 kg/m. No intake or output data in the 24 hours ending 11/28/22 2102    Sclerae unicteric  Oropharynx clear  No peripheral adenopathy  Lungs clear -- no rales or rhonchi  Heart regular rate and rhythm  Abdomen benign  MSK no focal spinal tenderness, (+) left lower extremity edema above ankle and foot   Neuro nonfocal    CBG (last 3)  No results for input(s): "GLUCAP" in the last 72 hours.   Labs:   Urine Studies No results for input(s): "UHGB", "CRYS" in the last 72 hours.  Invalid input(s): "UACOL", "UAPR", "USPG", "UPH", "UTP", "UGL", "UKET", "UBIL", "UNIT", "UROB", "ULEU", "UEPI", "UWBC", "URBC", "UBAC", "CAST", "UCOM", "BILUA"  Basic Metabolic Panel: Recent Labs  Lab 11/22/22 0500 11/23/22 0305 11/24/22 0835 11/25/22 0217 11/26/22 0358  NA 136 136 135 137 136  K 3.8 3.3* 4.0 4.0 4.0  CL 104 111 108 109 105  CO2 20* 18* 18* 18* 16*  GLUCOSE 184* 104* 85 80 71  BUN 25* 28* 31* 26* 24*  CREATININE 1.47* 1.39* 1.29* 1.14* 1.21*  CALCIUM 8.2* 7.0* 8.4* 8.3* 8.4*  MG 1.8  --   --   --   --   PHOS 4.2  --   --   --   --    GFR Estimated Creatinine Clearance: 23.6 mL/min (A) (by C-G formula based on SCr of 1.21 mg/dL (H)). Liver Function Tests: Recent Labs  Lab 11/22/22 0500 11/23/22 0305  AST 45* 29  ALT 18 18  ALKPHOS 71 59  BILITOT 1.3* 0.6  PROT 5.5* 4.6*  ALBUMIN 2.8* 2.4*   Recent Labs  Lab 11/22/22 0041  LIPASE 26   No results for input(s): "AMMONIA" in the last 168  hours. Coagulation profile Recent Labs  Lab 11/22/22 0500  INR 1.9*    CBC: Recent Labs  Lab 11/22/22 0500 11/23/22 0305 11/24/22 0835 11/25/22 0217 11/26/22 0358  WBC 6.3 9.2 6.2 4.4 4.0  NEUTROABS  --   --   --  3.2 3.0  HGB 9.1* 7.0* 9.5* 9.5* 9.7*  HCT 28.1* 21.2* 29.8* 29.4* 32.3*  MCV 91.5 89.8 92.0 92.2 96.4  PLT 151 152 161 153 171   Cardiac Enzymes: No results for input(s): "CKTOTAL", "CKMB", "CKMBINDEX", "TROPONINI" in the last 168 hours. BNP: Invalid input(s): "POCBNP" CBG: No results for input(s): "GLUCAP" in the last 168 hours. D-Dimer No results for input(s): "DDIMER" in the last 72 hours. Hgb A1c No results for input(s): "HGBA1C" in the last 72 hours. Lipid Profile No results for input(s): "CHOL", "HDL", "LDLCALC", "TRIG", "CHOLHDL", "LDLDIRECT" in the last 72 hours. Thyroid function studies No results for input(s): "TSH", "T4TOTAL", "T3FREE", "THYROIDAB" in the last 72 hours.  Invalid input(s): "FREET3" Anemia work up No results for input(s): "VITAMINB12", "FOLATE", "FERRITIN", "TIBC", "IRON", "RETICCTPCT" in the last 72 hours.  Microbiology Recent Results (from the past 240 hour(s))  Culture, blood (Routine  x 2)     Status: Abnormal   Collection Time: 11/21/22  6:07 PM   Specimen: BLOOD  Result Value Ref Range Status   Specimen Description BLOOD RIGHT ANTECUBITAL  Final   Special Requests   Final    BOTTLES DRAWN AEROBIC AND ANAEROBIC Blood Culture adequate volume   Culture  Setup Time   Final    GRAM NEGATIVE RODS IN BOTH AEROBIC AND ANAEROBIC BOTTLES CRITICAL RESULT CALLED TO, READ BACK BY AND VERIFIED WITHCathlyn Parsons, AT 1212 11/22/22 [facility] Performed at [facility], [place] [address], [place], [place] [address]    Culture KLEBSIELLA PNEUMONIAE (A)  Final   Report Status 11/24/2022 FINAL  Final   Organism ID, Bacteria KLEBSIELLA PNEUMONIAE  Final      Susceptibility   Klebsiella pneumoniae - MIC*    AMPICILLIN >=32  RESISTANT Resistant     CEFAZOLIN <=4 SENSITIVE Sensitive     CEFEPIME <=0.12 SENSITIVE Sensitive     CEFTAZIDIME <=1 SENSITIVE Sensitive     CEFTRIAXONE <=0.25 SENSITIVE Sensitive     CIPROFLOXACIN <=0.25 SENSITIVE Sensitive     GENTAMICIN <=1 SENSITIVE Sensitive     IMIPENEM <=0.25 SENSITIVE Sensitive     TRIMETH/SULFA <=20 SENSITIVE Sensitive     AMPICILLIN/SULBACTAM 8 SENSITIVE Sensitive     PIP/TAZO <=4 SENSITIVE Sensitive     * KLEBSIELLA PNEUMONIAE  Blood Culture ID Panel (Reflexed)     Status: Abnormal   Collection Time: 11/21/22  6:07 PM  Result Value Ref Range Status   Enterococcus faecalis NOT DETECTED NOT DETECTED Final   Enterococcus Faecium NOT DETECTED NOT DETECTED Final   Listeria monocytogenes NOT DETECTED NOT DETECTED Final   Staphylococcus species NOT DETECTED NOT DETECTED Final   Staphylococcus aureus (BCID) NOT DETECTED NOT DETECTED Final   Staphylococcus epidermidis NOT DETECTED NOT DETECTED Final   Staphylococcus lugdunensis NOT DETECTED NOT DETECTED Final   Streptococcus species NOT DETECTED NOT DETECTED Final   Streptococcus agalactiae NOT DETECTED NOT DETECTED Final   Streptococcus pneumoniae NOT DETECTED NOT DETECTED Final   Streptococcus pyogenes NOT DETECTED NOT DETECTED Final   A.calcoaceticus-baumannii NOT DETECTED NOT DETECTED Final   Bacteroides fragilis NOT DETECTED NOT DETECTED Final   Enterobacterales DETECTED (A) NOT DETECTED Final    Comment: Enterobacterales represent a large order of gram negative bacteria, not a single organism. CRITICAL RESULT CALLED TO, READ BACK BY AND VERIFIED WITH: E. MARTIN PHARMD, AT 1212 11/22/22 D. VANHOOK    Enterobacter cloacae complex NOT DETECTED NOT DETECTED Final   Escherichia coli NOT DETECTED NOT DETECTED Final   Klebsiella aerogenes NOT DETECTED NOT DETECTED Final   Klebsiella oxytoca NOT DETECTED NOT DETECTED Final   Klebsiella pneumoniae DETECTED (A) NOT DETECTED Final    Comment: CRITICAL RESULT CALLED  TO, READ BACK BY AND VERIFIED WITH: E. MARTIN PHARMD, AT 1212 11/22/22 D. VANHOOK    Proteus species NOT DETECTED NOT DETECTED Final   Salmonella species NOT DETECTED NOT DETECTED Final   Serratia marcescens NOT DETECTED NOT DETECTED Final   Haemophilus influenzae NOT DETECTED NOT DETECTED Final   Neisseria meningitidis NOT DETECTED NOT DETECTED Final   Pseudomonas aeruginosa NOT DETECTED NOT DETECTED Final   Stenotrophomonas maltophilia NOT DETECTED NOT DETECTED Final   Candida albicans NOT DETECTED NOT DETECTED Final   Candida auris NOT DETECTED NOT DETECTED Final   Candida glabrata NOT DETECTED NOT DETECTED Final   Candida krusei NOT DETECTED NOT DETECTED Final   Candida parapsilosis NOT  DETECTED NOT DETECTED Final   Candida tropicalis NOT DETECTED NOT DETECTED Final   Cryptococcus neoformans/gattii NOT DETECTED NOT DETECTED Final   CTX-M ESBL NOT DETECTED NOT DETECTED Final   Carbapenem resistance IMP NOT DETECTED NOT DETECTED Final   Carbapenem resistance KPC NOT DETECTED NOT DETECTED Final   Carbapenem resistance NDM NOT DETECTED NOT DETECTED Final   Carbapenem resist OXA 48 LIKE NOT DETECTED NOT DETECTED Final   Carbapenem resistance VIM NOT DETECTED NOT DETECTED Final    Comment: Performed at [facility], [place] [address] [place], [place] [address]  Resp panel by RT-PCR (RSV, Flu A&B, Covid) Anterior Nasal Swab     Status: None   Collection Time: 11/21/22  6:08 PM   Specimen: Anterior Nasal Swab  Result Value Ref Range Status   SARS Coronavirus 2 by RT PCR NEGATIVE NEGATIVE Final    Comment: (NOTE) SARS-CoV-2 target nucleic acids are NOT DETECTED.  The SARS-CoV-2 RNA is generally detectable in upper respiratory specimens during the acute phase of infection. The lowest concentration of SARS-CoV-2 viral copies this assay can detect is 138 copies/mL. A negative result does not preclude SARS-Cov-2 infection and should not be used as the sole basis for treatment or other  patient management decisions. A negative result may occur with  improper specimen collection/handling, submission of specimen other than nasopharyngeal swab, presence of viral mutation(s) within the areas targeted by this assay, and inadequate number of viral copies(<138 copies/mL). A negative result must be combined with clinical observations, patient history, and epidemiological information. The expected result is Negative.  Fact Sheet for Patients:  EntrepreneurPulse.com.au  Fact Sheet for Healthcare Providers:  IncredibleEmployment.be  This test is no t yet approved or cleared by the [place] FDA and  has been authorized for detection and/or diagnosis of SARS-CoV-2 by FDA under an Emergency Use Authorization (EUA). This EUA will remain  in effect (meaning this test can be used) for the duration of the COVID-19 declaration under Section 564(b)(1) of the Act, 21 U.S.C.section 360bbb-3(b)(1), unless the authorization is terminated  or revoked sooner.       Influenza A by PCR NEGATIVE NEGATIVE Final   Influenza B by PCR NEGATIVE NEGATIVE Final    Comment: (NOTE) The Xpert Xpress SARS-CoV-2/FLU/RSV plus assay is intended as an aid in the diagnosis of influenza from Nasopharyngeal swab specimens and should not be used as a sole basis for treatment. Nasal washings and aspirates are unacceptable for Xpert Xpress SARS-CoV-2/FLU/RSV testing.  Fact Sheet for Patients: EntrepreneurPulse.com.au  Fact Sheet for Healthcare Providers: IncredibleEmployment.be  This test is not yet approved or cleared by the [place] FDA and has been authorized for detection and/or diagnosis of SARS-CoV-2 by FDA under an Emergency Use Authorization (EUA). This EUA will remain in effect (meaning this test can be used) for the duration of the COVID-19 declaration under Section 564(b)(1) of the Act, 21 U.S.C. section  360bbb-3(b)(1), unless the authorization is terminated or revoked.     Resp Syncytial Virus by PCR NEGATIVE NEGATIVE Final    Comment: (NOTE) Fact Sheet for Patients: EntrepreneurPulse.com.au  Fact Sheet for Healthcare Providers: IncredibleEmployment.be  This test is not yet approved or cleared by the [place] FDA and has been authorized for detection and/or diagnosis of SARS-CoV-2 by FDA under an Emergency Use Authorization (EUA). This EUA will remain in effect (meaning this test can be used) for the duration of the COVID-19 declaration under Section 564(b)(1) of the Act, 21 U.S.C. section 360bbb-3(b)(1), unless  the authorization is terminated or revoked.  Performed at [facility], [place] [address] [place], [place] [address]   Urine Culture     Status: Abnormal   Collection Time: 11/21/22 10:53 PM   Specimen: In/Out Cath Urine  Result Value Ref Range Status   Specimen Description IN/OUT CATH URINE  Final   Special Requests   Final    NONE Performed at [facility], [place] [address], [place], [place] [address]    Culture 900 COLONIES/mL KLEBSIELLA PNEUMONIAE (A)  Final   Report Status 11/24/2022 FINAL  Final   Organism ID, Bacteria KLEBSIELLA PNEUMONIAE (A)  Final      Susceptibility   Klebsiella pneumoniae - MIC*    AMPICILLIN >=32 RESISTANT Resistant     CEFAZOLIN <=4 SENSITIVE Sensitive     CEFEPIME <=0.12 SENSITIVE Sensitive     CEFTRIAXONE <=0.25 SENSITIVE Sensitive     CIPROFLOXACIN <=0.25 SENSITIVE Sensitive     GENTAMICIN <=1 SENSITIVE Sensitive     IMIPENEM 0.5 SENSITIVE Sensitive     NITROFURANTOIN 32 SENSITIVE Sensitive     TRIMETH/SULFA <=20 SENSITIVE Sensitive     AMPICILLIN/SULBACTAM 4 SENSITIVE Sensitive     PIP/TAZO <=4 SENSITIVE Sensitive     * 900 COLONIES/mL KLEBSIELLA PNEUMONIAE      Studies:  No results found.  Assessment: 89 y.o. female   Severe sepsis secondary to UTI and ureteral stone,  post ureteral stent placement Acute blood loss anemia, probably secondary to anticoagulation RP adenopathy and infiltrative changes, concerning for metastatic breast cancer Suspected aspiration pneumonia, with hypoxia AKI secondary to dehydration Metastatic breast cancer, on supportive care Acute right lower extremity DVT and age-indeterminate her left lower extremity DVT Anxiety, depression    Plan:  -I agree with home hospice.  Patient's family initially wanted residential hospice, but that she does not meet criteria for residential hospice. -Patient complains of headache this morning, ordered Tylenol 650 mg one-time for her -Patient's family would like to meet hospice liaison, I asked my nurse to call [facility] [place] coordinator, to see if they can meet patient and her family before discharge. -I will follow-up as needed.  I will be happy to remain to be her attending when she is under hospice care.   Truitt Merle, MD 11/28/2022

## 2022-11-29 ENCOUNTER — Ambulatory Visit: Payer: PPO | Admitting: Physician Assistant

## 2022-11-29 DIAGNOSIS — N3001 Acute cystitis with hematuria: Secondary | ICD-10-CM | POA: Diagnosis not present

## 2022-11-29 NOTE — Discharge Summary (Signed)
Physician Discharge Summary  Gloria [name] GMW:[id number] DOB: 01-05-1928 DOA: 11/21/2022  PCP: Charlane Ferretti, MD  Admit date: 11/21/2022 Discharge date: 11/29/2022  Admitted From: Home Discharge disposition: Home with hospice  Recommendations at discharge:  Per hospice policy.   Brief narrative: Gloria [name] is a 89 y.o. female with PMH significant for HTN, nonobstructive CAD, left breast cancer, anxiety/depression who recently had ankle injury and subsequently developed DVT and was started on Eliquis.  This was complicated by intermittent melena but she remained on Eliquis. 1/23, patient's daughter took her to PCP with complaint of increased urinary frequency, dehydration and worsening confusion for 3 days.  She was sent home and suggested to increase hydration.  At home, her confusion worsened and EMS was called.  EMS noted her to have shortness of breath and wheezing all over.  En route, she received albuterol and Solu-Medrol.  In the ED, patient was febrile, lactic acid level was elevated FOBT positive Urinalysis showed yellow hazy color urine with moderate amount of blood, moderate leukocytes, positive nitrate and many bacteria  CT chest, abdomen pelvis showed 1. Diffuse inflammatory stranding surrounding the pancreas, duodenum, proximal small bowel, and descending colon. Findings are worrisome for acute pancreatitis. 2. Bladder wall thickening with surrounding inflammatory stranding worrisome for cystitis. 3. 2 mm calculus in the proximal right ureter with mild right-sided hydronephrosis. 4. Small amount of airspace disease in the lingula may represent atelectasis or infection. 6. New diffuse retroperitoneal stranding and left pararenal stranding of uncertain etiology. Findings may be related to retroperitoneal hemorrhage, infectious/inflammatory process, or less likely retroperitoneal fibrosis.  Blood culture and urine culture were sent. Patient was started on broad-spectrum  IV antibiotics. Patient was seen by urology. She underwent emergent cystoscopy, right ureteral stent placement. Postprocedure, she was admitted to hospitalist service  MRI abdomen 1/24 showed 1. Increase in retroperitoneal soft tissue now encasing vascular structures and narrowing venous structures in the retroperitoneum and affecting LEFT perinephric space greater than RIGHT. Findings concerning for infiltrative breast cancer metastasis.   2. Marked LEFT-sided perinephric stranding with persistent longstanding hydronephrosis, persistent but improved right-sided hydronephrosis  3. Sludge in the gallbladder. No biliary duct dilation involving intrahepatic biliary tree. No choledocholithiasis. 4. While stranding is seen about the pancreas, but unlikely to be pancreatitis.     Because of significant findings of cancer progression in the imagings, oncology consultation was obtained. Patient was deemed to be not a candidate for any further intervention. Palliative care consultation was obtained. Patient and family made a decision to not pursue any aggressive care but wanted to continue nonaggressive medical management while in the hospital Family had hoped for residential hospice but she did not meet criteria for that. Arrangements made for home hospice.  Subjective: Patient was seen and examined this morning.  Lying on bed comfortable.  Family had questions about inadequate control of pain and anxiety at night.  Medicines adjusted.  Assessment/Plan: Severe sepsis secondary to UTI - POA Obstructive right ureterolithiasis with mild hydronephrosis Klebsiella pneumonia and blood culture and urine culture Presented with dysuria, frequency, dehydration, worsening confusion Urinalysis showed yellow hazy color urine with moderate amount of blood, moderate leukocytes, positive nitrate and many bacteria CT scan showed 2 mm proximal right ureteral calculus with mild right-sided hydronephrosis, bladder  wall thickening. S/p emergent right ureteral stent placement -1/23 Blood culture and urine culture sent on admission grew Klebsiella pneumoniae.   Initially treated with IV aztreonam.  Later switched to oral Duricef.  To complete the  course at home. No recurrence of fever since stent placement.  WBC count normal.  Avoid further blood works Urology removed Foley catheter on 1/25. Recent Labs  Lab 11/23/22 0305 11/24/22 0512 11/24/22 0835 11/25/22 0217 11/26/22 0358  WBC 9.2  --  6.2 4.4 4.0  PROCALCITON 22.77 17.83  --   --   --    Possible progressive metastatic breast cancer Patient has history of blood pressure cancer and follows up with [name]. MRI as above showed increasing retroperitoneal soft tissue encasing vascular structures as well as affecting left perinephric space greater than right.  Findings concerning for infiltrative breast cancer metastasis. Per oncology, patient is not a candidate for any chemo or radiation.   Disposition: Home hospice as discussed above. For better control of pain and anxiety, patient is on  scheduled Tylenol 1 g 3 times daily scheduled Xanax 0.25 mg twice daily As needed morphine sublingual every 2 hours  Bilateral lower extremity DVT 1/24, ultrasound duplex of lower extremities showed acute DVT in the right gastrocnemius veins as well as left peroneal veins. However Eliquis cannot be used as of now because of significant anemia. Discussed with IR about IVC filter.  Patient is a poor candidate for it.  Acute blood loss anemia Presented with hemoglobin of 5.6. Per history, patient was recently started on Eliquis after DVT following an ankle injury.  She has noticed intermittent melena since then but continued Eliquis. FOBT positive in the ED.  Eliquis was stopped CT scan also showed diffuse retroperitoneal stranding and left pararenal standing suspicious for retroperitoneal hemorrhage. Received 3 units of PRBC so far.  Hemoglobin stable above  9 on last check.  On regular diet per patient's request. Recent Labs    11/22/22 0500 11/23/22 0305 11/24/22 0835 11/25/22 0217 11/26/22 0358  HGB 9.1* 7.0* 9.5* 9.5* 9.7*  MCV 91.5 89.8 92.0 92.2 96.4  FERRITIN  --  27  --   --   --   TIBC  --  298  --   --   --   IRON  --  18*  --   --   --   RETICCTPCT  --  3.0  --   --   --    Aspiration pneumonia Acute respiratory failure with hypoxia EMS noted shortness of breath, wheezing given albuterol and Solu-Medrol. CT chest showed airspace disease in lingula. Patient likely had aspiration pneumonia related to confusion Dyspnea likely from combination of pneumonia and severe anemia. Initially required supplemental oxygen..  Currently breathing on room air  AKI Baseline creatinine less than 1.2.  Creatinine this morning was 1.47.  Related to sepsis. No longer monitoring creatinine.   Essential hypertension PTA on HCTZ 12.5 mg daily, losartan twice daily (50 mg a.m. and 25 mg p.m.) Currently BP meds on hold.  Recent ankle injury followed by DVT Eliquis on hold Pain control with Tylenol PT eval ordered  Anxiety/depression PTA on Xanax, doxepin  Goals of care   Code Status: DNR. Palliative care consult appreciated.  Wounds:  - Incision (Closed) 11/22/22 Perineum (Active)  Date First Assessed/Time First Assessed: 11/22/22 0233   Location: Perineum    Assessments 11/22/2022  2:35 AM 11/28/2022  8:16 PM  Dressing Type None None  Site / Wound Assessment -- Clean  Drainage Amount Minimal Minimal  Drainage Description -- Serosanguineous  Treatment -- Other (Comment)     No associated orders.    Discharge Exam:   Vitals:   11/28/22 2311 11/29/22 0250 11/29/22  0805 11/29/22 0857  BP: (!) 152/84 (!) 155/78 136/77 136/77  Pulse: 91 93 89 84  Resp: 19 (!) '21 18 20  '$ Temp: 98 F (36.7 C)  98.9 F (37.2 C)   TempSrc: Oral Oral Oral   SpO2: 93% 93% 95% 94%  Weight:      Height:        Body mass index is 24.8 kg/m.    General exam: Pleasant, elderly Caucasian female.  Not in distress.  Remains comfortable. Skin: No rashes, lesions or ulcers. HEENT: Atraumatic, normocephalic, no obvious bleeding Lungs: Diminished air entry in both bases, otherwise clear to auscultation bilaterally CVS: Regular rate and rhythm, no murmur GI/Abd soft, nondistended, nontender, bowel sound present, CNS: Alert, awake, oriented x 3.  Slow to respond.  Extremely hard of hearing Psychiatry: Mood appropriate. Extremities: Mild edema both legs.  Follow ups:    Follow-up Information     Ardis Hughs, MD. Schedule an appointment as soon as possible for a visit.   Specialty: Urology Contact information: [place] [address] [address] [place]. Call.   Why: Call or go online to order [place] for home - this is not covered by Medicare or insurance at this time - currently website offering 10% off the first online order Contact information: [email or web address]   [phone]                Discharge Instructions:   Discharge Instructions     Call MD for:  difficulty breathing, headache or visual disturbances   Complete by: As directed    Call MD for:  extreme fatigue   Complete by: As directed    Call MD for:  hives   Complete by: As directed    Call MD for:  persistant dizziness or light-headedness   Complete by: As directed    Call MD for:  persistant nausea and vomiting   Complete by: As directed    Call MD for:  severe uncontrolled pain   Complete by: As directed    Call MD for:  temperature >100.4   Complete by: As directed    Diet general   Complete by: As directed    Discharge instructions   Complete by: As directed    General discharge instructions: Follow with Primary MD Charlane Ferretti, MD in 7 days  Please request your PCP  to go over your hospital tests, procedures, radiology results at the follow up. Please get your medicines reviewed and  adjusted.  Your PCP may decide to repeat certain labs or tests as needed. Do not drive, operate heavy machinery, perform activities at heights, swimming or participation in water activities or provide baby sitting services if your were admitted for syncope or siezures until you have seen by Primary MD or a Neurologist and advised to do so again. [place] [name] Controlled Substance Reporting System database was reviewed. Do not drive, operate heavy machinery, perform activities at heights, swim, participate in water activities or provide baby-sitting services while on medications for pain, sleep and mood until your outpatient physician has reevaluated you and advised to do so again.  You are strongly recommended to comply with the dose, frequency and duration of prescribed medications. Activity: As tolerated with Full fall precautions use walker/cane & assistance as needed Avoid using any recreational substances like cigarette, tobacco, alcohol, or non-prescribed drug. If you experience worsening of your admission symptoms, develop shortness of breath, life threatening  emergency, suicidal or homicidal thoughts you must seek medical attention immediately by calling 911 or calling your MD immediately  if symptoms less severe. You must read complete instructions/literature along with all the possible adverse reactions/side effects for all the medicines you take and that have been prescribed to you. Take any new medicine only after you have completely understood and accepted all the possible adverse reactions/side effects.  Wear Seat belts while driving. You were cared for by a hospitalist during your hospital stay. If you have any questions about your discharge medications or the care you received while you were in the hospital after you are discharged, you can call the unit and ask to speak with the hospitalist or the covering physician. Once you are discharged, your primary care physician will handle any  further medical issues. Please note that NO REFILLS for any discharge medications will be authorized once you are discharged, as it is imperative that you return to your primary care physician (or establish a relationship with a primary care physician if you do not have one).   Increase activity slowly   Complete by: As directed        Discharge Medications:   Allergies as of 11/29/2022       Reactions   Contrast Media [iodinated Contrast Media] Anaphylaxis   09/19/17-Pt premedicated. Then pt stated she was DNR. Spoke with [name] who did not realize allergy was recorded as anaphylactic. [name] verbally gave ok to change CT exam to without IV contrast. Pt and daughter do not recall occurrence of anaphylactic reaction to contrast media. Geanie Kenning, 3:27pm   Adhesive [tape] Other (See Comments)   blisters   Cephalexin Swelling   Tolerated amoxicillin 11/24/22   Ciprofloxacin Swelling   Demerol Nausea And Vomiting   Sulfa Antibiotics    Sulfamethoxazole-trimethoprim Itching, Swelling   REACTION: swelling/hives   Quinolones Itching, Rash   REACTION: itching, rash Pt. Reports no problems with levaquin        Medication List     STOP taking these medications    apixaban 5 MG Tabs tablet Commonly known as: Eliquis   hydrochlorothiazide 12.5 MG capsule Commonly known as: MICROZIDE   losartan 25 MG tablet Commonly known as: COZAAR   losartan 50 MG tablet Commonly known as: COZAAR   nitroGLYCERIN 0.4 MG SL tablet Commonly known as: NITROSTAT   ondansetron 4 MG tablet Commonly known as: ZOFRAN   pseudoephedrine-acetaminophen 30-500 MG Tabs tablet Commonly known as: TYLENOL SINUS       TAKE these medications    acetaminophen 500 MG tablet Commonly known as: TYLENOL Take 500 mg by mouth in the morning and at bedtime.   ALPRAZolam 0.25 MG tablet Commonly known as: XANAX Take 1 tablet (0.25 mg total) by mouth 3 (three) times daily for 7 days. What changed:   how much to take when to take this additional instructions   cefadroxil 500 MG capsule Commonly known as: DURICEF Take 1 capsule (500 mg total) by mouth 2 (two) times daily for 7 days.   doxepin 10 MG capsule Commonly known as: SINEQUAN Take 10 mg by mouth at bedtime.   famotidine 20 MG tablet Commonly known as: PEPCID Take 20 mg by mouth daily as needed for heartburn or indigestion.   ferrous sulfate 325 (65 FE) MG tablet Take 1 tablet (325 mg total) by mouth daily with breakfast.   Flovent HFA 44 MCG/ACT inhaler Generic drug: fluticasone Inhale 1-2 puffs into the lungs 2 (two) times  daily.   fluticasone 50 MCG/ACT nasal spray Commonly known as: FLONASE Place 1 spray into both nostrils 2 (two) times daily.   guaiFENesin 600 MG 12 hr tablet Commonly known as: MUCINEX Take 1 tablet (600 mg total) by mouth 2 (two) times daily. What changed:  when to take this reasons to take this additional instructions   morphine CONCENTRATE 10 MG/0.5ML Soln concentrated solution Take 0.13 mLs (2.6 mg total) by mouth every 8 (eight) hours as needed for up to 3 days for severe pain.   pantoprazole 40 MG tablet Commonly known as: PROTONIX Take 1 tablet (40 mg total) by mouth 2 (two) times daily.   zolpidem 5 MG tablet Commonly known as: AMBIEN Take 1 tablet (5 mg total) by mouth at bedtime as needed for sleep.         The results of significant diagnostics from this hospitalization (including imaging, microbiology, ancillary and laboratory) are listed below for reference.    Procedures and Diagnostic Studies:   MR ABDOMEN MRCP W WO CONTAST  Result Date: 11/23/2022 CLINICAL DATA:  Acute pancreatitis in a 89 year old female. EXAM: MRI ABDOMEN WITHOUT AND WITH CONTRAST (INCLUDING MRCP) TECHNIQUE: Multiplanar multisequence MR imaging of the abdomen was performed both before and after the administration of intravenous contrast. Heavily T2-weighted images of the biliary and  pancreatic ducts were obtained, and three-dimensional MRCP images were rendered by post processing. CONTRAST:  7.45m GADAVIST GADOBUTROL 1 MMOL/ML IV SOLN COMPARISON:  November 21, 2021 CT of the chest, and ultrasound evaluation which was performed on November 22, 2021 FINDINGS: Lower chest: Small LEFT and trace RIGHT effusions.  Cardiomegaly. Hepatobiliary: Sludge in the gallbladder. No biliary duct dilation. Smooth segmental attenuation of the common bile duct in the pancreas but without biliary duct dilation involving intrahepatic biliary tree. No choledocholithiasis. Study limited by motion related artifact in this debilitated patient. No focal, suspicious hepatic lesion. Portal vein is patent. Hepatic arterial supply derive from celiac axis in a classic fashion. Pancreas: Pancreas with maintained intrinsic T1 signal and without signs of pancreatic atrophy or ductal dilation. No focal pancreatic lesion. Spleen: Normal size with lobular contour which is unchanged compared to previous imaging. Adrenals/Urinary Tract: LEFT adrenal gland is obscured by diffuse soft tissue and stranding in the LEFT retroperitoneum. This stranding and soft tissue encases the aorta, celiac and SMA and tracks to the RIGHT of the midline also involving the RIGHT adrenal gland. Small amounts of blood in the collecting systems of the RIGHT kidney post stent placement. Urothelial enhancement along the course of the RIGHT ureter. No discrete suspicious renal lesion. Mild LEFT hydronephrosis similar to prior imaging. RIGHT hydronephrosis persists but is improved following stent placement. Course of the RIGHT ureter appears medially deviated towards the IVC which is diminutive. Stomach/Bowel: No acute gastrointestinal process to the extent evaluated on this abdominal MRI. Vascular/Lymphatic: Narrowing of the IVC due to soft tissue in the retroperitoneum. Encased retroperitoneal and mesenteric vasculature. LEFT retroperitoneal soft tissue seen on  previous PET imaging now contiguous with this diffuse soft tissue that surrounds vasculature in the retroperitoneum. No discrete adenopathy. When compared to prior PET imaging there is much more diffuse soft tissue and the IVC is clearly narrowed compared to imaging from August. There is also encasement of the LEFT renal vein. Renal enhancement remain symmetric. Large cyst arises from the upper pole the LEFT kidney not substantially changed more remote imaging. Other: Small volume ascites in the LEFT upper quadrant at about the spleen. Diffuse body wall edema.  Noted on coronal images diffuse enhancement with near nodular appearance of the renal fascia on the LEFT is noted, this process largely spares the RIGHT kidney and perinephric space affecting the RIGHT adrenal to some extent as outlined above. Musculoskeletal: No suspicious bone lesions identified. There is in infiltrative appearance of nodular soft tissue along the anterior margin of the spine and LEFT psoas, quadratus lumborum and LEFT hemidiaphragm. IMPRESSION: 1. Increase in retroperitoneal soft tissue now encasing vascular structures and narrowing venous structures in the retroperitoneum and affecting LEFT perinephric space greater than RIGHT. Findings are concerning for process such is IgG 4 related disease versus is infiltrative breast cancer metastasis. 2. Marked LEFT-sided perinephric stranding with persistent longstanding hydronephrosis related to above process. Superimposed pyelonephritis is considered in this patient with urosepsis now post RIGHT nephroureteral stenting. 3. Small volume ascites in the LEFT upper quadrant at about the spleen. Difficult to exclude the possibility of small amounts of blood products within this fluid given density on recent chest abdomen and pelvis CT though some of this density could be attributed to presence of soft tissue which is shown to enhance in these areas. 4. Small LEFT and trace RIGHT effusions. 5. Diffuse  body wall edema. 6. Sludge in the gallbladder. No biliary duct dilation involving intrahepatic biliary tree. No choledocholithiasis. There is some mild irregularity with respect to biliary narrowing, a finding that could also be seen in the setting of IgG 4 related disease. 7. While stranding is seen about the pancreas pancreas shows remarkably normal appearing intrinsic T1 signal. Pancreatitis is unlikely but continued correlation with pancreatic enzymes may be helpful to allow for exclusion of this possibility. 8. Given presence of bilateral effusions and generalized edema would also continued to assess for changes in clinical markers of heart failure. Narrowing of the IVC could lead to diffuse edema as well 9. Persistent but improved RIGHT-sided hydronephrosis. These results will be called to the ordering clinician or representative by the Radiologist Assistant, and communication documented in the PACS or [employer]. Electronically Signed   By: Zetta Bills M.D.   On: 11/23/2022 08:26   VAS [place] LOWER EXTREMITY VENOUS (DVT)  Result Date: 11/22/2022  Lower Venous DVT Study Patient Name:  KLOEY CAZAREZ  Date of Exam:   11/22/2022 Medical Rec #: [id number]      Accession #:    [id number] Date of Birth: 1927/11/12      Patient Gender: F Patient Age:   68 years Exam Location:  [facility] Procedure:      VAS [place] LOWER EXTREMITY VENOUS (DVT) Referring Phys: Terrilee Croak --------------------------------------------------------------------------------  Indications: Swelling, Eliquis discontinued secondary to bleeding.  Risk Factors: None identified. Limitations: Patient pain/guarding. Comparison Study: 09-27-2022 Right lower extremity venous study was positive for                   DVT. Performing Technologist: Darlin Coco RDMS, RVT  Examination Guidelines: A complete evaluation includes B-mode imaging, spectral Doppler, color Doppler, and power Doppler as needed of all accessible portions of each vessel.  Bilateral testing is considered an integral part of a complete examination. Limited examinations for reoccurring indications may be performed as noted. The reflux portion of the exam is performed with the patient in reverse Trendelenburg.  +---------+---------------+---------+-----------+----------+--------------+ RIGHT    CompressibilityPhasicitySpontaneityPropertiesThrombus Aging +---------+---------------+---------+-----------+----------+--------------+ CFV      Full           Yes      Yes                                 +---------+---------------+---------+-----------+----------+--------------+  SFJ      Full                                                        +---------+---------------+---------+-----------+----------+--------------+ FV Prox  Full                                                        +---------+---------------+---------+-----------+----------+--------------+ FV Mid   Full                                                        +---------+---------------+---------+-----------+----------+--------------+ FV DistalFull                                                        +---------+---------------+---------+-----------+----------+--------------+ PFV      Full                                                        +---------+---------------+---------+-----------+----------+--------------+ POP      Full           Yes      Yes                                 +---------+---------------+---------+-----------+----------+--------------+ PTV      Full                                                        +---------+---------------+---------+-----------+----------+--------------+ PERO     Full                                                        +---------+---------------+---------+-----------+----------+--------------+ Gastroc  None           No       No                   Acute           +---------+---------------+---------+-----------+----------+--------------+   +---------+---------------+---------+-----------+----------+-----------------+ LEFT     CompressibilityPhasicitySpontaneityPropertiesThrombus Aging    +---------+---------------+---------+-----------+----------+-----------------+ CFV      Full           Yes      Yes                                    +---------+---------------+---------+-----------+----------+-----------------+  SFJ      Full                                                           +---------+---------------+---------+-----------+----------+-----------------+ FV Prox  Full                                                           +---------+---------------+---------+-----------+----------+-----------------+ FV Mid   Full                                                           +---------+---------------+---------+-----------+----------+-----------------+ FV DistalFull                                                           +---------+---------------+---------+-----------+----------+-----------------+ PFV      Full                                                           +---------+---------------+---------+-----------+----------+-----------------+ POP      Full           Yes      Yes                                    +---------+---------------+---------+-----------+----------+-----------------+ PTV      Full                                                           +---------+---------------+---------+-----------+----------+-----------------+ PERO     Partial        Yes      Yes                  Age Indeterminate +---------+---------------+---------+-----------+----------+-----------------+ Gastroc  Full                                                           +---------+---------------+---------+-----------+----------+-----------------+     Summary: RIGHT: - Findings consistent with acute  deep vein thrombosis involving the right gastrocnemius veins. - No cystic structure found in the popliteal fossa.  LEFT: - Findings consistent with age indeterminate deep vein thrombosis involving the left peroneal veins. - No cystic structure found in the  popliteal fossa.  *See table(s) above for measurements and observations. Electronically signed by Harold Barban MD on 11/22/2022 at 8:57:23 PM.    Final    US Abdomen Limited RUQ (LIVER/GB)  Result Date: 11/22/2022 CLINICAL DATA:  Elevated liver enzymes EXAM: ULTRASOUND ABDOMEN LIMITED RIGHT UPPER QUADRANT COMPARISON:  CT 11/21/2022 FINDINGS: Gallbladder: Distended gallbladder without wall thickening or pericholecystic fluid. No intraluminal gallstones identified. Negative sonographic Murphy sign. Common bile duct: Diameter: 3.9 mm, normal.  No intrahepatic ductal dilation. Liver: Increased liver echogenicity. No focal lesion identified. Portal vein is patent on color Doppler imaging with normal direction of blood flow towards the liver. Other: None. IMPRESSION: Hepatic steatosis. No evidence of cholecystitis or biliary obstruction. Electronically Signed   By: Maurine Simmering M.D.   On: 11/22/2022 10:30   DG C-Arm 1-60 Min-No Report  Result Date: 11/22/2022 Fluoroscopy was utilized by the requesting physician.  No radiographic interpretation.   CT CHEST ABDOMEN PELVIS WO CONTRAST  Result Date: 11/21/2022 CLINICAL DATA:  Sepsis.  History of breast cancer. EXAM: CT CHEST, ABDOMEN AND PELVIS WITHOUT CONTRAST TECHNIQUE: Multidetector CT imaging of the chest, abdomen and pelvis was performed following the standard protocol without IV contrast. RADIATION DOSE REDUCTION: This exam was performed according to the departmental dose-optimization program which includes automated exposure control, adjustment of the mA and/or kV according to patient size and/or use of iterative reconstruction technique. COMPARISON:  CT chest abdomen and pelvis 09/19/2017 FINDINGS: CT  CHEST FINDINGS Cardiovascular: No significant vascular findings. Normal heart size. No pericardial effusion. There are atherosclerotic calcifications of the aorta. Mediastinum/Nodes: There is a 6 mm hypodense right thyroid nodule, unchanged. There are no enlarged mediastinal, hilar or axillary lymph nodes. Lungs/Pleura: Mild emphysematous changes are seen in the lung apices. There is a small amount of atelectasis and airspace disease in the lingula. There is a trace left pleural effusion. There is dependent atelectasis in both lower lobes. Musculoskeletal: No acute fracture or focal osseous lesion. Left mastectomy changes are present. There surgical clips in the left axilla. CT ABDOMEN PELVIS FINDINGS Hepatobiliary: No focal liver abnormality is seen. No gallstones, gallbladder wall thickening, or biliary dilatation. Pancreas: There is inflammatory stranding surrounding the entire pancreas. There is no fluid collection or ductal dilatation. Spleen: Normal in size without focal abnormality. Adrenals/Urinary Tract: There is bladder wall thickening with mild surrounding inflammatory stranding. There is bilateral perinephric fat stranding, left greater than right. There is also some hyperdensity and stranding in the left pararenal space. There is a 2 mm calculus in the proximal right ureter image 3/72 with mild right-sided hydronephrosis. There is no left-sided hydronephrosis. Left renal cysts are unchanged measuring up 2 4.5 cm. Stomach/Bowel: There is inflammatory stranding surrounding the duodenal and proximal small bowel as it approximates pancreas. There is also some wall thickening of the descending colon with mild surrounding inflammatory stranding. There is sigmoid colon diverticulosis without evidence for diverticulitis. The appendix is not seen. There is no pneumatosis, bowel obstruction or free air. Stomach is decompressed. Vascular/Lymphatic: Aorta and IVC are normal in size. There are atherosclerotic  calcifications of the aorta. There is diffuse retroperitoneal/para-aortic inflammatory stranding which is new from prior. No discrete enlarged lymph nodes are identified. Reproductive: Status post hysterectomy. No adnexal masses. Other: There is a small fat containing umbilical hernia. There is mild body wall edema. There is no ascites. Musculoskeletal: There are degenerative changes of the lower lumbar spine. IMPRESSION: 1. Diffuse inflammatory stranding surrounding the pancreas, duodenum, proximal small bowel, and descending colon.  Findings are worrisome for acute pancreatitis. 2. Bladder wall thickening with surrounding inflammatory stranding worrisome for cystitis. 3. 2 mm calculus in the proximal right ureter with mild right-sided hydronephrosis. 4. Trace left pleural effusion. 5. Small amount of airspace disease in the lingula may represent atelectasis or infection. 6. New diffuse retroperitoneal stranding and left pararenal stranding of uncertain etiology. Findings may be related to retroperitoneal hemorrhage, infectious/inflammatory process, or less likely retroperitoneal fibrosis. 7. Mild body wall edema. Aortic Atherosclerosis (ICD10-I70.0) and Emphysema (ICD10-J43.9). Electronically Signed   By: Ronney Asters M.D.   On: 11/21/2022 23:29   DG Chest Port 1 View  Result Date: 11/21/2022 CLINICAL DATA:  Sepsis, shortness of breath.  Breast cancer. EXAM: PORTABLE CHEST 1 VIEW COMPARISON:  09/06/2022 FINDINGS: Reverse lordotic projection. Mild atherosclerotic vascular calcification of the thoracic aorta. Left axillary clips. Lingular scarring or atelectasis along the left heart border, similar to prior exams. IMPRESSION: 1. Lingular scarring or atelectasis along the left heart border, similar to prior exams. 2. Mild atherosclerotic vascular calcification of the thoracic aorta. Electronically Signed   By: Van Clines M.D.   On: 11/21/2022 18:28     Labs:   Basic Metabolic Panel: Recent Labs   Lab 11/23/22 0305 11/24/22 0835 11/25/22 0217 11/26/22 0358  NA 136 135 137 136  K 3.3* 4.0 4.0 4.0  CL 111 108 109 105  CO2 18* 18* 18* 16*  GLUCOSE 104* 85 80 71  BUN 28* 31* 26* 24*  CREATININE 1.39* 1.29* 1.14* 1.21*  CALCIUM 7.0* 8.4* 8.3* 8.4*   GFR Estimated Creatinine Clearance: 23.6 mL/min (A) (by C-G formula based on SCr of 1.21 mg/dL (H)). Liver Function Tests: Recent Labs  Lab 11/23/22 0305  AST 29  ALT 18  ALKPHOS 59  BILITOT 0.6  PROT 4.6*  ALBUMIN 2.4*   No results for input(s): "LIPASE", "AMYLASE" in the last 168 hours. No results for input(s): "AMMONIA" in the last 168 hours. Coagulation profile No results for input(s): "INR", "PROTIME" in the last 168 hours.  CBC: Recent Labs  Lab 11/23/22 0305 11/24/22 0835 11/25/22 0217 11/26/22 0358  WBC 9.2 6.2 4.4 4.0  NEUTROABS  --   --  3.2 3.0  HGB 7.0* 9.5* 9.5* 9.7*  HCT 21.2* 29.8* 29.4* 32.3*  MCV 89.8 92.0 92.2 96.4  PLT 152 161 153 171   Cardiac Enzymes: No results for input(s): "CKTOTAL", "CKMB", "CKMBINDEX", "TROPONINI" in the last 168 hours. BNP: Invalid input(s): "POCBNP" CBG: No results for input(s): "GLUCAP" in the last 168 hours. D-Dimer No results for input(s): "DDIMER" in the last 72 hours. Hgb A1c No results for input(s): "HGBA1C" in the last 72 hours. Lipid Profile No results for input(s): "CHOL", "HDL", "LDLCALC", "TRIG", "CHOLHDL", "LDLDIRECT" in the last 72 hours. Thyroid function studies No results for input(s): "TSH", "T4TOTAL", "T3FREE", "THYROIDAB" in the last 72 hours.  Invalid input(s): "FREET3" Anemia work up No results for input(s): "VITAMINB12", "FOLATE", "FERRITIN", "TIBC", "IRON", "RETICCTPCT" in the last 72 hours. Microbiology Recent Results (from the past 240 hour(s))  Culture, blood (Routine x 2)     Status: Abnormal   Collection Time: 11/21/22  6:07 PM   Specimen: BLOOD  Result Value Ref Range Status   Specimen Description BLOOD RIGHT ANTECUBITAL   Final   Special Requests   Final    BOTTLES DRAWN AEROBIC AND ANAEROBIC Blood Culture adequate volume   Culture  Setup Time   Final    GRAM NEGATIVE RODS IN BOTH AEROBIC AND ANAEROBIC BOTTLES  CRITICAL RESULT CALLED TO, READ BACK BY AND VERIFIED WITHCathlyn Parsons, AT 1212 11/22/22 [place] Performed at [facility], [place] [address] [place], [place] [address]    Culture KLEBSIELLA PNEUMONIAE (A)  Final   Report Status 11/24/2022 FINAL  Final   Organism ID, Bacteria KLEBSIELLA PNEUMONIAE  Final      Susceptibility   Klebsiella pneumoniae - MIC*    AMPICILLIN >=32 RESISTANT Resistant     CEFAZOLIN <=4 SENSITIVE Sensitive     CEFEPIME <=0.12 SENSITIVE Sensitive     CEFTAZIDIME <=1 SENSITIVE Sensitive     CEFTRIAXONE <=0.25 SENSITIVE Sensitive     CIPROFLOXACIN <=0.25 SENSITIVE Sensitive     GENTAMICIN <=1 SENSITIVE Sensitive     IMIPENEM <=0.25 SENSITIVE Sensitive     TRIMETH/SULFA <=20 SENSITIVE Sensitive     AMPICILLIN/SULBACTAM 8 SENSITIVE Sensitive     PIP/TAZO <=4 SENSITIVE Sensitive     * KLEBSIELLA PNEUMONIAE  Blood Culture ID Panel (Reflexed)     Status: Abnormal   Collection Time: 11/21/22  6:07 PM  Result Value Ref Range Status   Enterococcus faecalis NOT DETECTED NOT DETECTED Final   Enterococcus Faecium NOT DETECTED NOT DETECTED Final   Listeria monocytogenes NOT DETECTED NOT DETECTED Final   Staphylococcus species NOT DETECTED NOT DETECTED Final   Staphylococcus aureus (BCID) NOT DETECTED NOT DETECTED Final   Staphylococcus epidermidis NOT DETECTED NOT DETECTED Final   Staphylococcus lugdunensis NOT DETECTED NOT DETECTED Final   Streptococcus species NOT DETECTED NOT DETECTED Final   Streptococcus agalactiae NOT DETECTED NOT DETECTED Final   Streptococcus pneumoniae NOT DETECTED NOT DETECTED Final   Streptococcus pyogenes NOT DETECTED NOT DETECTED Final   A.calcoaceticus-baumannii NOT DETECTED NOT DETECTED Final   Bacteroides fragilis NOT DETECTED NOT  DETECTED Final   Enterobacterales DETECTED (A) NOT DETECTED Final    Comment: Enterobacterales represent a large order of gram negative bacteria, not a single organism. CRITICAL RESULT CALLED TO, READ BACK BY AND VERIFIED WITH: E. MARTIN PHARMD, AT 1212 11/22/22 D. VANHOOK    Enterobacter cloacae complex NOT DETECTED NOT DETECTED Final   Escherichia coli NOT DETECTED NOT DETECTED Final   Klebsiella aerogenes NOT DETECTED NOT DETECTED Final   Klebsiella oxytoca NOT DETECTED NOT DETECTED Final   Klebsiella pneumoniae DETECTED (A) NOT DETECTED Final    Comment: CRITICAL RESULT CALLED TO, READ BACK BY AND VERIFIED WITH: E. MARTIN PHARMD, AT 1212 11/22/22 D. VANHOOK    Proteus species NOT DETECTED NOT DETECTED Final   Salmonella species NOT DETECTED NOT DETECTED Final   Serratia marcescens NOT DETECTED NOT DETECTED Final   Haemophilus influenzae NOT DETECTED NOT DETECTED Final   Neisseria meningitidis NOT DETECTED NOT DETECTED Final   Pseudomonas aeruginosa NOT DETECTED NOT DETECTED Final   Stenotrophomonas maltophilia NOT DETECTED NOT DETECTED Final   Candida albicans NOT DETECTED NOT DETECTED Final   Candida auris NOT DETECTED NOT DETECTED Final   Candida glabrata NOT DETECTED NOT DETECTED Final   Candida krusei NOT DETECTED NOT DETECTED Final   Candida parapsilosis NOT DETECTED NOT DETECTED Final   Candida tropicalis NOT DETECTED NOT DETECTED Final   Cryptococcus neoformans/gattii NOT DETECTED NOT DETECTED Final   CTX-M ESBL NOT DETECTED NOT DETECTED Final   Carbapenem resistance IMP NOT DETECTED NOT DETECTED Final   Carbapenem resistance KPC NOT DETECTED NOT DETECTED Final   Carbapenem resistance NDM NOT DETECTED NOT DETECTED Final   Carbapenem resist OXA 48 LIKE NOT DETECTED NOT DETECTED Final   Carbapenem  resistance VIM NOT DETECTED NOT DETECTED Final    Comment: Performed at [facility], [place] [address], [place], [place] [address]  Resp panel by RT-PCR (RSV, Flu A&B, Covid)  Anterior Nasal Swab     Status: None   Collection Time: 11/21/22  6:08 PM   Specimen: Anterior Nasal Swab  Result Value Ref Range Status   SARS Coronavirus 2 by RT PCR NEGATIVE NEGATIVE Final    Comment: (NOTE) SARS-CoV-2 target nucleic acids are NOT DETECTED.  The SARS-CoV-2 RNA is generally detectable in upper respiratory specimens during the acute phase of infection. The lowest concentration of SARS-CoV-2 viral copies this assay can detect is 138 copies/mL. A negative result does not preclude SARS-Cov-2 infection and should not be used as the sole basis for treatment or other patient management decisions. A negative result may occur with  improper specimen collection/handling, submission of specimen other than nasopharyngeal swab, presence of viral mutation(s) within the areas targeted by this assay, and inadequate number of viral copies(<138 copies/mL). A negative result must be combined with clinical observations, patient history, and epidemiological information. The expected result is Negative.  Fact Sheet for Patients:  EntrepreneurPulse.com.au  Fact Sheet for Healthcare Providers:  IncredibleEmployment.be  This test is no t yet approved or cleared by the [place] FDA and  has been authorized for detection and/or diagnosis of SARS-CoV-2 by FDA under an Emergency Use Authorization (EUA). This EUA will remain  in effect (meaning this test can be used) for the duration of the COVID-19 declaration under Section 564(b)(1) of the Act, 21 U.S.C.section 360bbb-3(b)(1), unless the authorization is terminated  or revoked sooner.       Influenza A by PCR NEGATIVE NEGATIVE Final   Influenza B by PCR NEGATIVE NEGATIVE Final    Comment: (NOTE) The Xpert Xpress SARS-CoV-2/FLU/RSV plus assay is intended as an aid in the diagnosis of influenza from Nasopharyngeal swab specimens and should not be used as a sole basis for treatment. Nasal washings  and aspirates are unacceptable for Xpert Xpress SARS-CoV-2/FLU/RSV testing.  Fact Sheet for Patients: EntrepreneurPulse.com.au  Fact Sheet for Healthcare Providers: IncredibleEmployment.be  This test is not yet approved or cleared by the [place] FDA and has been authorized for detection and/or diagnosis of SARS-CoV-2 by FDA under an Emergency Use Authorization (EUA). This EUA will remain in effect (meaning this test can be used) for the duration of the COVID-19 declaration under Section 564(b)(1) of the Act, 21 U.S.C. section 360bbb-3(b)(1), unless the authorization is terminated or revoked.     Resp Syncytial Virus by PCR NEGATIVE NEGATIVE Final    Comment: (NOTE) Fact Sheet for Patients: EntrepreneurPulse.com.au  Fact Sheet for Healthcare Providers: IncredibleEmployment.be  This test is not yet approved or cleared by the [place] FDA and has been authorized for detection and/or diagnosis of SARS-CoV-2 by FDA under an Emergency Use Authorization (EUA). This EUA will remain in effect (meaning this test can be used) for the duration of the COVID-19 declaration under Section 564(b)(1) of the Act, 21 U.S.C. section 360bbb-3(b)(1), unless the authorization is terminated or revoked.  Performed at [facility], [place] [address] [place], [place] [address]   Urine Culture     Status: Abnormal   Collection Time: 11/21/22 10:53 PM   Specimen: In/Out Cath Urine  Result Value Ref Range Status   Specimen Description IN/OUT CATH URINE  Final   Special Requests   Final    NONE Performed at [facility], [address] [place], [place] [address]    Culture 900 COLONIES/mL KLEBSIELLA PNEUMONIAE (A)  Final   Report Status 11/24/2022 FINAL  Final   Organism ID, Bacteria KLEBSIELLA PNEUMONIAE (A)  Final      Susceptibility   Klebsiella pneumoniae - MIC*    AMPICILLIN >=32 RESISTANT Resistant      CEFAZOLIN <=4 SENSITIVE Sensitive     CEFEPIME <=0.12 SENSITIVE Sensitive     CEFTRIAXONE <=0.25 SENSITIVE Sensitive     CIPROFLOXACIN <=0.25 SENSITIVE Sensitive     GENTAMICIN <=1 SENSITIVE Sensitive     IMIPENEM 0.5 SENSITIVE Sensitive     NITROFURANTOIN 32 SENSITIVE Sensitive     TRIMETH/SULFA <=20 SENSITIVE Sensitive     AMPICILLIN/SULBACTAM 4 SENSITIVE Sensitive     PIP/TAZO <=4 SENSITIVE Sensitive     * 900 COLONIES/mL KLEBSIELLA PNEUMONIAE    Time coordinating discharge: 35 minutes  Signed: America Sandall  [facility] 11/29/2022, 11:16 AM

## 2022-11-29 NOTE — Plan of Care (Signed)
Patient is going home with family and receiving hospice care at home.

## 2022-11-29 NOTE — TOC Transition Note (Signed)
Transition of Care (TOC) - CM/SW Discharge Note Marvetta Gibbons RN,BSN Transitions of Care Unit 4NP (Non Trauma)- RN Case Manager See Treatment Team for direct Phone #   Patient Details  Name: Gloria [name] MRN: [id number] Date of Birth: January 23, 1928  Transition of Care [facility]) CM/SW Contact:  Dawayne Patricia, RN Phone Number: 11/29/2022, 12:20 PM   Clinical Narrative:    DME has been confirmed delivered to the home and Per Hospice liaison with [facility] [place] - family ready for pt to come home w/ Hospice- Hospice will plan to see pt in the home today, tentative time is for 2pm if pt able to arrive home by then.   MD has placed d/c order and pt stable for transport.   CM spoke with son at bedside- address confirmed for transport.   [facility] called and ETA given is an hour to an hour and half for transport. Paperwork and GOLD DNR placed on chart.   No further TOC needs noted. Bedside RN updated   Final next level of care: Home w Hospice Care Barriers to Discharge: Barriers Resolved   Patient Goals and CMS Choice CMS Medicare.gov Compare Post Acute Care list provided to:: Patient Represenative (must comment) Choice offered to / list presented to : Adult Children  Discharge Placement               Home w/ Hospice          Discharge Plan and Services Additional resources added to the After Visit Summary for   In-house Referral: Clinical Social Work Discharge Planning Services: CM Consult Post Acute Care Choice: Hospice          DME Arranged: Hospice Equipment Package Others DME Agency: [employer]     Representative spoke with at DME Agency: Per [facility] Date [place]jan Givan: 11/27/22 Time [place]: 1030 Representative spoke with at [place]: [place] Interventions SDOH Screenings   Tobacco Use: Medium Risk (11/24/2022)     Readmission Risk  Interventions    11/29/2022   12:20 PM  Readmission Risk Prevention Plan  Transportation Screening Complete  PCP or Specialist Appt within 5-7 Days Not Complete  Not Complete comments going home w/ hospice  Home Care Screening Complete  Medication Review (RN CM) Complete

## 2022-12-27 ENCOUNTER — Ambulatory Visit (HOSPITAL_COMMUNITY): Payer: PPO

## 2022-12-29 DEATH — deceased

## 2023-09-19 ENCOUNTER — Encounter: Payer: Self-pay | Admitting: Hematology

## 2023-09-19 NOTE — Telephone Encounter (Signed)
Telephone call  

## 2023-11-01 IMAGING — DX DG CHEST 2V
2 series · 2 of 2 positions shown · non-contrast
Comparison: 10/27/2021

CLINICAL DATA: [AGE] female with a history of COPD

EXAM:
CHEST - 2 VIEW

[dg chest 2 view (1 of 2)]
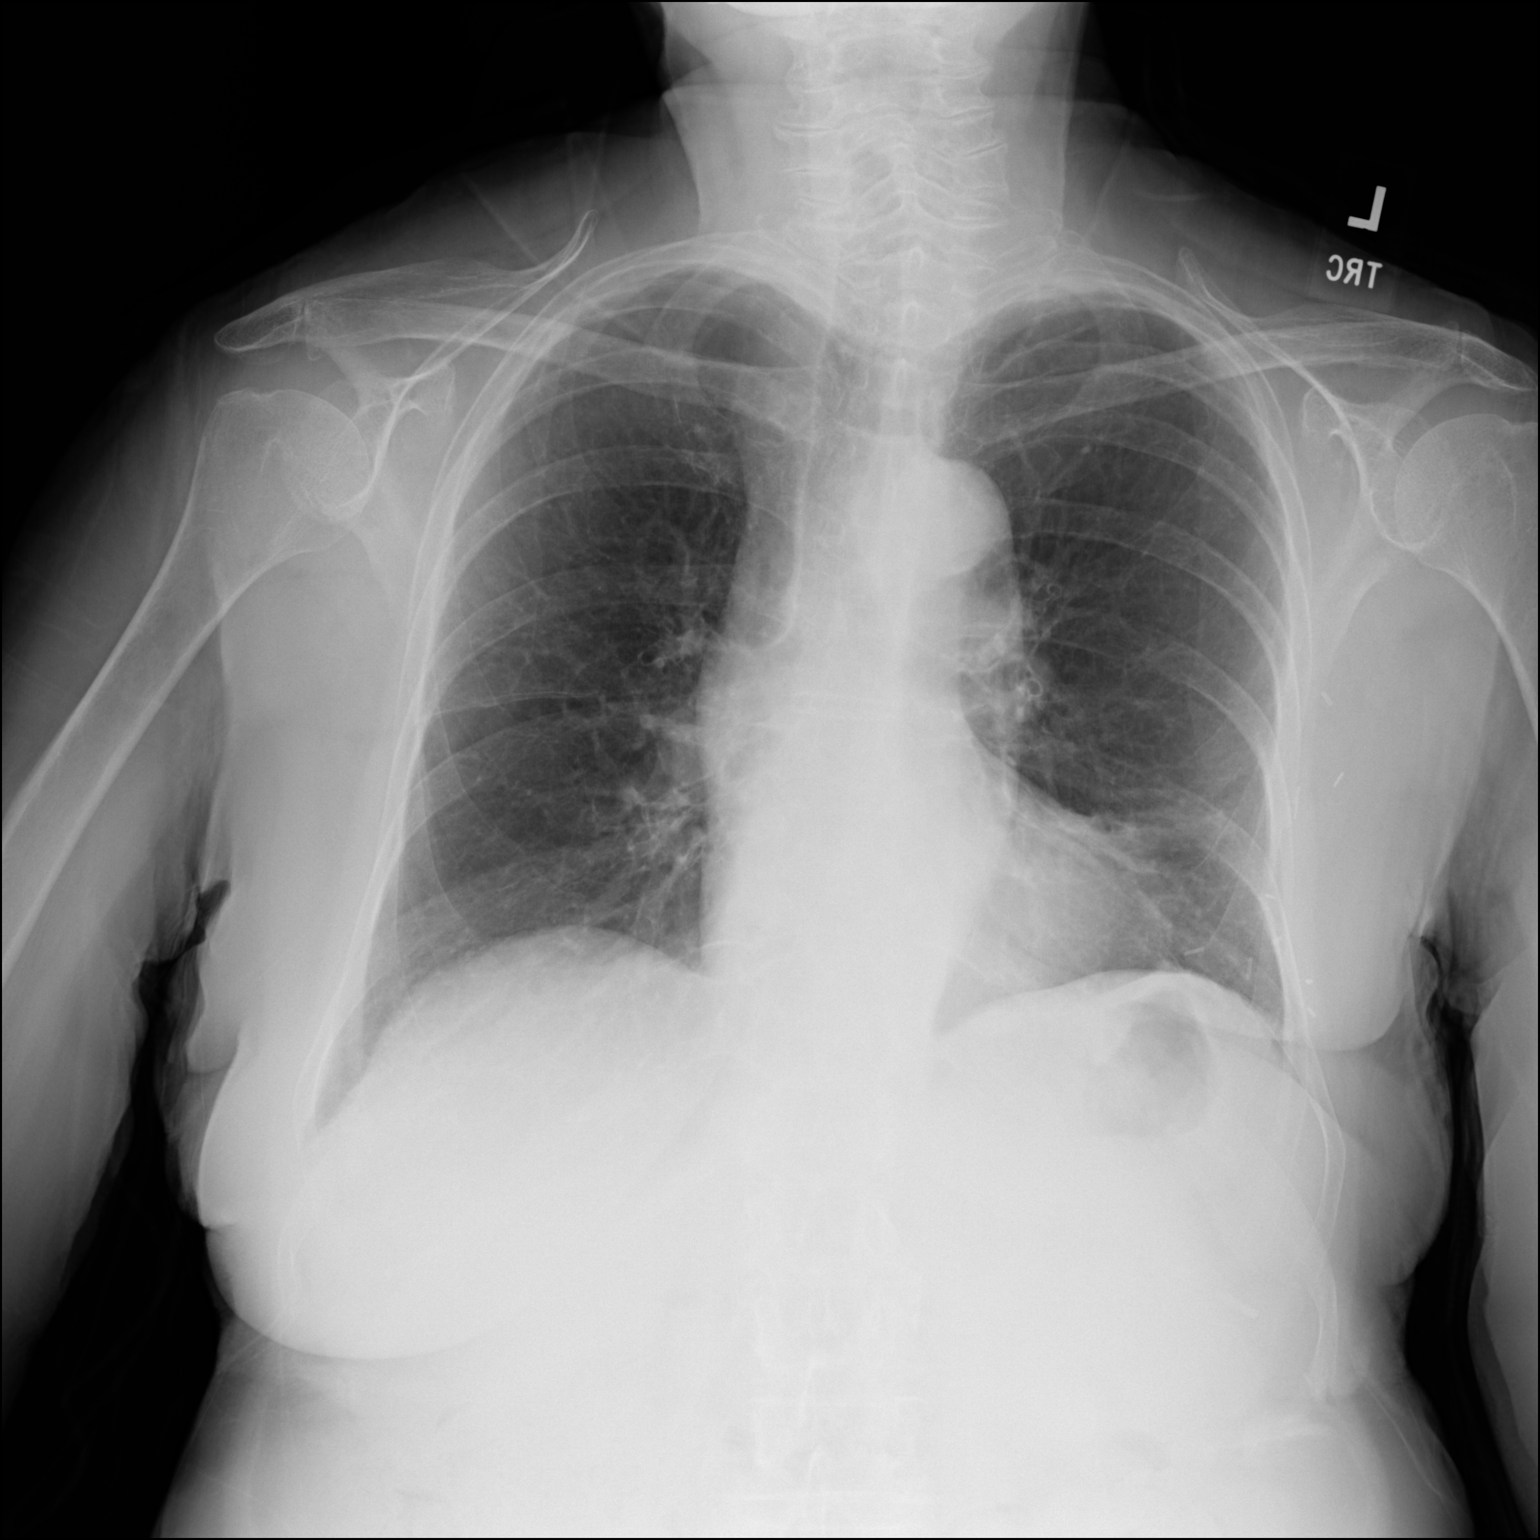

[dg chest 2 view (2 of 2)]
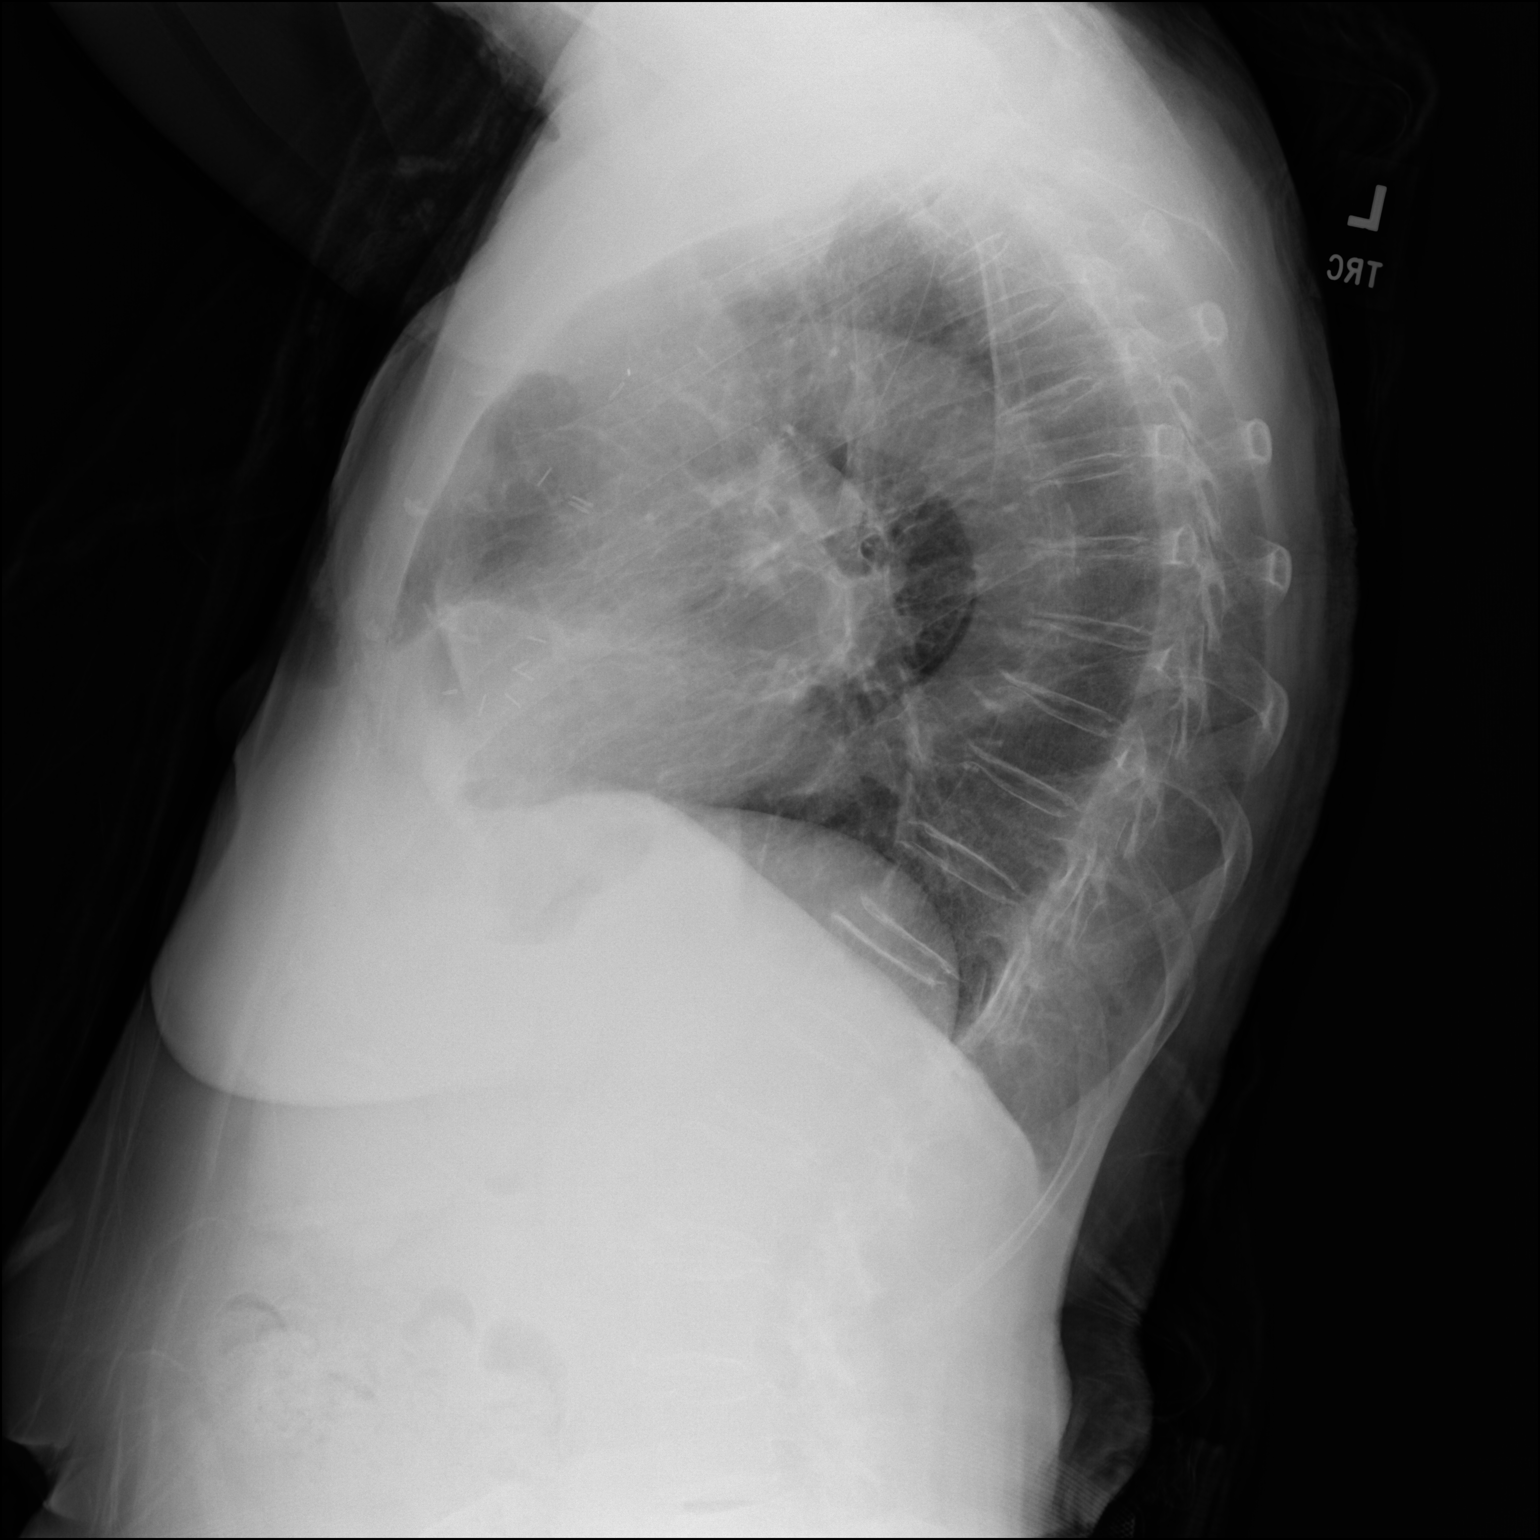

[2 of 2 positions shown; findings below may reference images not displayed]

FINDINGS: Cardiomediastinal silhouette unchanged in size and contour. No
evidence of central vascular congestion. No interlobular septal
thickening.

No pneumothorax or pleural effusion. Coarsened interstitial
markings, with no confluent airspace disease.

No acute displaced fracture. Degenerative changes of the spine.

Kyphotic curvature of the spine again noted.
IMPRESSION: No active cardiopulmonary disease.
